# Patient Record
Sex: Female | Born: 1935 | Race: White | Hispanic: No | Marital: Single | Smoking: Never smoker
Health system: Southern US, Community
[De-identification: ages and names within clinical notes are randomized; demographics above are authoritative.]

## PROBLEM LIST (undated history)

## (undated) DIAGNOSIS — R32 Unspecified urinary incontinence: Secondary | ICD-10-CM

## (undated) DIAGNOSIS — M8580 Other specified disorders of bone density and structure, unspecified site: Secondary | ICD-10-CM

## (undated) DIAGNOSIS — C8190 Hodgkin lymphoma, unspecified, unspecified site: Secondary | ICD-10-CM

## (undated) DIAGNOSIS — F419 Anxiety disorder, unspecified: Secondary | ICD-10-CM

## (undated) DIAGNOSIS — I639 Cerebral infarction, unspecified: Secondary | ICD-10-CM

## (undated) DIAGNOSIS — Z8639 Personal history of other endocrine, nutritional and metabolic disease: Secondary | ICD-10-CM

## (undated) DIAGNOSIS — I10 Essential (primary) hypertension: Secondary | ICD-10-CM

## (undated) DIAGNOSIS — R2689 Other abnormalities of gait and mobility: Secondary | ICD-10-CM

## (undated) DIAGNOSIS — F32A Depression, unspecified: Secondary | ICD-10-CM

## (undated) DIAGNOSIS — F329 Major depressive disorder, single episode, unspecified: Secondary | ICD-10-CM

## (undated) DIAGNOSIS — E559 Vitamin D deficiency, unspecified: Secondary | ICD-10-CM

## (undated) DIAGNOSIS — E1140 Type 2 diabetes mellitus with diabetic neuropathy, unspecified: Secondary | ICD-10-CM

## (undated) DIAGNOSIS — D649 Anemia, unspecified: Secondary | ICD-10-CM

## (undated) DIAGNOSIS — B449 Aspergillosis, unspecified: Secondary | ICD-10-CM

## (undated) DIAGNOSIS — D126 Benign neoplasm of colon, unspecified: Secondary | ICD-10-CM

## (undated) DIAGNOSIS — E119 Type 2 diabetes mellitus without complications: Secondary | ICD-10-CM

## (undated) DIAGNOSIS — F840 Autistic disorder: Secondary | ICD-10-CM

## (undated) DIAGNOSIS — E785 Hyperlipidemia, unspecified: Secondary | ICD-10-CM

## (undated) DIAGNOSIS — R200 Anesthesia of skin: Secondary | ICD-10-CM

## (undated) DIAGNOSIS — I509 Heart failure, unspecified: Secondary | ICD-10-CM

## (undated) DIAGNOSIS — N189 Chronic kidney disease, unspecified: Secondary | ICD-10-CM

## (undated) HISTORY — DX: Other specified disorders of bone density and structure, unspecified site: M85.80

## (undated) HISTORY — DX: Cerebral infarction, unspecified: I63.9

## (undated) HISTORY — PX: ABDOMINAL HYSTERECTOMY: SHX81

## (undated) HISTORY — DX: Depression, unspecified: F32.A

## (undated) HISTORY — DX: Anesthesia of skin: R20.0

## (undated) HISTORY — DX: Major depressive disorder, single episode, unspecified: F32.9

## (undated) HISTORY — DX: Vitamin D deficiency, unspecified: E55.9

## (undated) HISTORY — DX: Heart failure, unspecified: I50.9

## (undated) HISTORY — DX: Essential (primary) hypertension: I10

## (undated) HISTORY — DX: Anxiety disorder, unspecified: F41.9

## (undated) HISTORY — DX: Autistic disorder: F84.0

## (undated) HISTORY — DX: Type 2 diabetes mellitus with diabetic neuropathy, unspecified: E11.40

## (undated) HISTORY — PX: BREAST LUMPECTOMY: SHX2

## (undated) HISTORY — DX: Other abnormalities of gait and mobility: R26.89

## (undated) HISTORY — DX: Chronic kidney disease, unspecified: N18.9

## (undated) HISTORY — DX: Personal history of other endocrine, nutritional and metabolic disease: Z86.39

## (undated) HISTORY — DX: Anemia, unspecified: D64.9

## (undated) HISTORY — DX: Benign neoplasm of colon, unspecified: D12.6

---

## 2009-02-25 LAB — HM COLONOSCOPY

## 2011-04-27 DIAGNOSIS — Z1231 Encounter for screening mammogram for malignant neoplasm of breast: Secondary | ICD-10-CM | POA: Diagnosis not present

## 2011-04-28 DIAGNOSIS — D649 Anemia, unspecified: Secondary | ICD-10-CM | POA: Diagnosis not present

## 2011-04-28 DIAGNOSIS — E1165 Type 2 diabetes mellitus with hyperglycemia: Secondary | ICD-10-CM | POA: Diagnosis not present

## 2011-04-28 DIAGNOSIS — E118 Type 2 diabetes mellitus with unspecified complications: Secondary | ICD-10-CM | POA: Diagnosis not present

## 2011-04-28 DIAGNOSIS — L259 Unspecified contact dermatitis, unspecified cause: Secondary | ICD-10-CM | POA: Diagnosis not present

## 2011-05-02 DIAGNOSIS — R928 Other abnormal and inconclusive findings on diagnostic imaging of breast: Secondary | ICD-10-CM | POA: Diagnosis not present

## 2011-05-06 DIAGNOSIS — M79609 Pain in unspecified limb: Secondary | ICD-10-CM | POA: Diagnosis not present

## 2011-05-06 DIAGNOSIS — R269 Unspecified abnormalities of gait and mobility: Secondary | ICD-10-CM | POA: Diagnosis not present

## 2011-05-06 DIAGNOSIS — L608 Other nail disorders: Secondary | ICD-10-CM | POA: Diagnosis not present

## 2011-05-06 DIAGNOSIS — B351 Tinea unguium: Secondary | ICD-10-CM | POA: Diagnosis not present

## 2011-05-12 DIAGNOSIS — K2100 Gastro-esophageal reflux disease with esophagitis, without bleeding: Secondary | ICD-10-CM | POA: Diagnosis not present

## 2011-05-13 DIAGNOSIS — E119 Type 2 diabetes mellitus without complications: Secondary | ICD-10-CM | POA: Diagnosis not present

## 2011-06-08 DIAGNOSIS — L259 Unspecified contact dermatitis, unspecified cause: Secondary | ICD-10-CM | POA: Diagnosis not present

## 2011-06-29 DIAGNOSIS — L259 Unspecified contact dermatitis, unspecified cause: Secondary | ICD-10-CM | POA: Diagnosis not present

## 2011-06-29 DIAGNOSIS — S61209A Unspecified open wound of unspecified finger without damage to nail, initial encounter: Secondary | ICD-10-CM | POA: Diagnosis not present

## 2011-06-29 DIAGNOSIS — N281 Cyst of kidney, acquired: Secondary | ICD-10-CM | POA: Diagnosis not present

## 2011-06-29 DIAGNOSIS — E118 Type 2 diabetes mellitus with unspecified complications: Secondary | ICD-10-CM | POA: Diagnosis not present

## 2011-09-07 DIAGNOSIS — I119 Hypertensive heart disease without heart failure: Secondary | ICD-10-CM | POA: Diagnosis not present

## 2011-09-07 DIAGNOSIS — S79919A Unspecified injury of unspecified hip, initial encounter: Secondary | ICD-10-CM | POA: Diagnosis not present

## 2011-09-07 DIAGNOSIS — D7589 Other specified diseases of blood and blood-forming organs: Secondary | ICD-10-CM | POA: Diagnosis not present

## 2011-09-07 DIAGNOSIS — E785 Hyperlipidemia, unspecified: Secondary | ICD-10-CM | POA: Diagnosis not present

## 2011-09-07 DIAGNOSIS — M25559 Pain in unspecified hip: Secondary | ICD-10-CM | POA: Diagnosis not present

## 2011-09-07 DIAGNOSIS — I519 Heart disease, unspecified: Secondary | ICD-10-CM | POA: Diagnosis not present

## 2011-09-07 DIAGNOSIS — M25569 Pain in unspecified knee: Secondary | ICD-10-CM | POA: Diagnosis not present

## 2011-09-07 DIAGNOSIS — S99919A Unspecified injury of unspecified ankle, initial encounter: Secondary | ICD-10-CM | POA: Diagnosis not present

## 2011-09-07 DIAGNOSIS — S8990XA Unspecified injury of unspecified lower leg, initial encounter: Secondary | ICD-10-CM | POA: Diagnosis not present

## 2011-09-07 DIAGNOSIS — R350 Frequency of micturition: Secondary | ICD-10-CM | POA: Diagnosis not present

## 2011-09-07 DIAGNOSIS — S99929A Unspecified injury of unspecified foot, initial encounter: Secondary | ICD-10-CM | POA: Diagnosis not present

## 2011-09-07 DIAGNOSIS — E118 Type 2 diabetes mellitus with unspecified complications: Secondary | ICD-10-CM | POA: Diagnosis not present

## 2011-09-07 DIAGNOSIS — E559 Vitamin D deficiency, unspecified: Secondary | ICD-10-CM | POA: Diagnosis not present

## 2011-10-20 DIAGNOSIS — M25579 Pain in unspecified ankle and joints of unspecified foot: Secondary | ICD-10-CM | POA: Diagnosis not present

## 2011-10-20 DIAGNOSIS — B351 Tinea unguium: Secondary | ICD-10-CM | POA: Diagnosis not present

## 2011-10-20 DIAGNOSIS — E119 Type 2 diabetes mellitus without complications: Secondary | ICD-10-CM | POA: Diagnosis not present

## 2011-10-20 DIAGNOSIS — L609 Nail disorder, unspecified: Secondary | ICD-10-CM | POA: Diagnosis not present

## 2011-11-19 DIAGNOSIS — Z79899 Other long term (current) drug therapy: Secondary | ICD-10-CM | POA: Diagnosis not present

## 2011-11-19 DIAGNOSIS — S99929A Unspecified injury of unspecified foot, initial encounter: Secondary | ICD-10-CM | POA: Diagnosis not present

## 2011-11-19 DIAGNOSIS — S92919A Unspecified fracture of unspecified toe(s), initial encounter for closed fracture: Secondary | ICD-10-CM | POA: Diagnosis not present

## 2011-11-19 DIAGNOSIS — E119 Type 2 diabetes mellitus without complications: Secondary | ICD-10-CM | POA: Diagnosis not present

## 2011-11-19 DIAGNOSIS — M79609 Pain in unspecified limb: Secondary | ICD-10-CM | POA: Diagnosis not present

## 2011-11-19 DIAGNOSIS — S8990XA Unspecified injury of unspecified lower leg, initial encounter: Secondary | ICD-10-CM | POA: Diagnosis not present

## 2011-11-23 DIAGNOSIS — S90129A Contusion of unspecified lesser toe(s) without damage to nail, initial encounter: Secondary | ICD-10-CM | POA: Diagnosis not present

## 2011-12-15 DIAGNOSIS — R5381 Other malaise: Secondary | ICD-10-CM | POA: Diagnosis not present

## 2011-12-15 DIAGNOSIS — I10 Essential (primary) hypertension: Secondary | ICD-10-CM | POA: Diagnosis not present

## 2011-12-15 DIAGNOSIS — E118 Type 2 diabetes mellitus with unspecified complications: Secondary | ICD-10-CM | POA: Diagnosis not present

## 2011-12-15 DIAGNOSIS — E871 Hypo-osmolality and hyponatremia: Secondary | ICD-10-CM | POA: Diagnosis not present

## 2011-12-15 DIAGNOSIS — E785 Hyperlipidemia, unspecified: Secondary | ICD-10-CM | POA: Diagnosis not present

## 2011-12-15 DIAGNOSIS — E559 Vitamin D deficiency, unspecified: Secondary | ICD-10-CM | POA: Diagnosis not present

## 2011-12-15 DIAGNOSIS — R5383 Other fatigue: Secondary | ICD-10-CM | POA: Diagnosis not present

## 2011-12-15 DIAGNOSIS — I1310 Hypertensive heart and chronic kidney disease without heart failure, with stage 1 through stage 4 chronic kidney disease, or unspecified chronic kidney disease: Secondary | ICD-10-CM | POA: Diagnosis not present

## 2011-12-15 DIAGNOSIS — I119 Hypertensive heart disease without heart failure: Secondary | ICD-10-CM | POA: Diagnosis not present

## 2011-12-19 DIAGNOSIS — E119 Type 2 diabetes mellitus without complications: Secondary | ICD-10-CM | POA: Diagnosis not present

## 2011-12-22 DIAGNOSIS — B351 Tinea unguium: Secondary | ICD-10-CM | POA: Diagnosis not present

## 2011-12-22 DIAGNOSIS — L609 Nail disorder, unspecified: Secondary | ICD-10-CM | POA: Diagnosis not present

## 2011-12-22 DIAGNOSIS — M25579 Pain in unspecified ankle and joints of unspecified foot: Secondary | ICD-10-CM | POA: Diagnosis not present

## 2011-12-22 DIAGNOSIS — E1159 Type 2 diabetes mellitus with other circulatory complications: Secondary | ICD-10-CM | POA: Diagnosis not present

## 2012-01-16 DIAGNOSIS — H2589 Other age-related cataract: Secondary | ICD-10-CM | POA: Diagnosis not present

## 2012-01-19 DIAGNOSIS — H2510 Age-related nuclear cataract, unspecified eye: Secondary | ICD-10-CM | POA: Diagnosis not present

## 2012-03-14 DIAGNOSIS — M109 Gout, unspecified: Secondary | ICD-10-CM | POA: Diagnosis not present

## 2012-03-14 DIAGNOSIS — R5381 Other malaise: Secondary | ICD-10-CM | POA: Diagnosis not present

## 2012-03-14 DIAGNOSIS — D649 Anemia, unspecified: Secondary | ICD-10-CM | POA: Diagnosis not present

## 2012-03-14 DIAGNOSIS — I1310 Hypertensive heart and chronic kidney disease without heart failure, with stage 1 through stage 4 chronic kidney disease, or unspecified chronic kidney disease: Secondary | ICD-10-CM | POA: Diagnosis not present

## 2012-03-14 DIAGNOSIS — E118 Type 2 diabetes mellitus with unspecified complications: Secondary | ICD-10-CM | POA: Diagnosis not present

## 2012-03-14 DIAGNOSIS — F329 Major depressive disorder, single episode, unspecified: Secondary | ICD-10-CM | POA: Diagnosis not present

## 2012-03-14 DIAGNOSIS — I119 Hypertensive heart disease without heart failure: Secondary | ICD-10-CM | POA: Diagnosis not present

## 2012-03-14 DIAGNOSIS — E785 Hyperlipidemia, unspecified: Secondary | ICD-10-CM | POA: Diagnosis not present

## 2012-03-14 DIAGNOSIS — R5383 Other fatigue: Secondary | ICD-10-CM | POA: Diagnosis not present

## 2012-03-14 DIAGNOSIS — E1165 Type 2 diabetes mellitus with hyperglycemia: Secondary | ICD-10-CM | POA: Diagnosis not present

## 2012-03-27 ENCOUNTER — Emergency Department (HOSPITAL_COMMUNITY)
Admission: EM | Admit: 2012-03-27 | Discharge: 2012-03-27 | Disposition: A | Discharge status: Home / Self Care | Payer: Medicare Other | Attending: Emergency Medicine | Admitting: Emergency Medicine

## 2012-03-27 ENCOUNTER — Encounter (HOSPITAL_COMMUNITY): Payer: Self-pay | Admitting: *Deleted

## 2012-03-27 DIAGNOSIS — R229 Localized swelling, mass and lump, unspecified: Secondary | ICD-10-CM

## 2012-03-27 DIAGNOSIS — C8190 Hodgkin lymphoma, unspecified, unspecified site: Secondary | ICD-10-CM | POA: Insufficient documentation

## 2012-03-27 DIAGNOSIS — Z90710 Acquired absence of both cervix and uterus: Secondary | ICD-10-CM | POA: Diagnosis not present

## 2012-03-27 DIAGNOSIS — E119 Type 2 diabetes mellitus without complications: Secondary | ICD-10-CM | POA: Diagnosis not present

## 2012-03-27 DIAGNOSIS — Z8619 Personal history of other infectious and parasitic diseases: Secondary | ICD-10-CM | POA: Diagnosis not present

## 2012-03-27 DIAGNOSIS — E785 Hyperlipidemia, unspecified: Secondary | ICD-10-CM | POA: Insufficient documentation

## 2012-03-27 HISTORY — DX: Aspergillosis, unspecified: B44.9

## 2012-03-27 HISTORY — DX: Unspecified urinary incontinence: R32

## 2012-03-27 HISTORY — DX: Hodgkin lymphoma, unspecified, unspecified site: C81.90

## 2012-03-27 HISTORY — DX: Type 2 diabetes mellitus without complications: E11.9

## 2012-03-27 HISTORY — DX: Hyperlipidemia, unspecified: E78.5

## 2012-03-27 NOTE — ED Notes (Signed)
Patient is alert and orientedx4.  Patient is accompanied by Grafton Folk, her medical power of attorney who is here with the patient.  She was explained the discharge instructions and she understood them with no questions.

## 2012-03-27 NOTE — ED Provider Notes (Signed)
History   This chart was scribed for Lisa Roots, MD by Charolett Bumpers, ED Scribe. The patient was seen in room TR07C/TR07C. Patient's care was started at 1749.   CSN: [id number]  Arrival date & time 03/27/12  1730   First MD Initiated Contact with Patient 03/27/12 1749      Chief Complaint  Patient presents with  . RT arm growth     The history is provided by the patient. No language interpreter was used.  Lisa Villegas is a 78 y.o. female who presents to the Emergency Department complaining of small growth to her right upper inner arm that is 3 cm in diameter that family noticed yesterday. She denies any associated pain with the growth. She has a h/o hodgkin's disease. She also complains of an itchy area to right forearm that is not improved and has a few small scabs on. No other skin rash, itching, or other skin lesions noted. Does not feel ill or sick. No fever or chills. No nv.      Past Medical History  Diagnosis Date  . Diabetes mellitus without complication   . Hodgkin disease   . Hyperlipidemia   . Aspergillosis   . Incontinence     Past Surgical History  Procedure Date  . Abdominal hysterectomy   . Breast surgery     No family history on file.  History  Substance Use Topics  . Smoking status: Never Smoker   . Smokeless tobacco: Not on file  . Alcohol Use: No    OB History    Grav Para Term Preterm Abortions TAB SAB Ect Mult Living                  Review of Systems  Skin: Positive for rash.       Growth to right upper arm.   All other systems reviewed and are negative.    Allergies  Penicillins  Home Medications  No current outpatient prescriptions on file.  BP 119/58  Pulse 80  Temp 98 F (36.7 C) (Oral)  Resp 16  SpO2 97%  Physical Exam  Nursing note and vitals reviewed. Constitutional: She is oriented to person, place, and time. She appears well-developed and well-nourished. No distress.  HENT:  Head: Normocephalic and  atraumatic.  Eyes: Conjunctivae normal are normal. No scleral icterus.  Neck: Neck supple. No tracheal deviation present.  Cardiovascular: Normal rate.   Pulmonary/Chest: Effort normal. No respiratory distress.  Abdominal: Soft. There is no tenderness.  Musculoskeletal: Normal range of motion.       Soft, fleshy, mobile, non tender 3-4 cm diameter subcutaneous mass medial aspect right upper arm. No erythema or skin changes. No other masses noted.   Neurological: She is alert and oriented to person, place, and time. No cranial nerve deficit.  Skin: Skin is warm and dry. Rash noted.  Psychiatric: She has a normal mood and affect. Her behavior is normal.    ED Course  Procedures (including critical care time)  DIAGNOSTIC STUDIES: Oxygen Saturation is 97% on room air, adequate by my interpretation.    COORDINATION OF CARE:  18:04-Discussed planned course of treatment with the patient, who is agreeable at this time. ]     MDM  I personally performed the services described in this documentation, which was scribed in my presence. The recorded information has been reviewed and is accurate.  Few tiny, healing scabs to left forearm. Not itchy currently. Felt most consistently with scratching/scratch mark to  area. No rash, no scabies.  Soft fleshy non tender mobile mass inner aspect right upper arm most c/w lipoma - discussed w family need for pcp f/u, if area becomes painful, larger, further evaluation - discussed need pcp follow up.       Lisa Roots, MD 03/27/12 [phone]

## 2012-03-27 NOTE — ED Notes (Signed)
Patient came in with a "gorwth" in her right arm. Denies, pain, not draining, or redness.

## 2012-03-27 NOTE — ED Notes (Signed)
Pt is here with soft growth noted to right upper arm she noted yesterday.  Pt has rash to left forearm that itches

## 2012-05-21 DIAGNOSIS — F411 Generalized anxiety disorder: Secondary | ICD-10-CM | POA: Diagnosis not present

## 2012-05-21 DIAGNOSIS — K2100 Gastro-esophageal reflux disease with esophagitis, without bleeding: Secondary | ICD-10-CM | POA: Diagnosis not present

## 2012-05-21 DIAGNOSIS — D1779 Benign lipomatous neoplasm of other sites: Secondary | ICD-10-CM | POA: Diagnosis not present

## 2012-05-21 DIAGNOSIS — Q6689 Other  specified congenital deformities of feet: Secondary | ICD-10-CM | POA: Diagnosis not present

## 2012-05-21 DIAGNOSIS — Z79899 Other long term (current) drug therapy: Secondary | ICD-10-CM | POA: Diagnosis not present

## 2012-06-05 DIAGNOSIS — M21969 Unspecified acquired deformity of unspecified lower leg: Secondary | ICD-10-CM | POA: Diagnosis not present

## 2012-06-05 DIAGNOSIS — E860 Dehydration: Secondary | ICD-10-CM | POA: Diagnosis not present

## 2012-08-02 ENCOUNTER — Other Ambulatory Visit: Payer: Self-pay | Admitting: Internal Medicine

## 2012-08-02 DIAGNOSIS — E041 Nontoxic single thyroid nodule: Secondary | ICD-10-CM | POA: Diagnosis not present

## 2012-08-02 DIAGNOSIS — E785 Hyperlipidemia, unspecified: Secondary | ICD-10-CM | POA: Diagnosis not present

## 2012-08-02 DIAGNOSIS — E119 Type 2 diabetes mellitus without complications: Secondary | ICD-10-CM | POA: Diagnosis not present

## 2012-08-02 DIAGNOSIS — I10 Essential (primary) hypertension: Secondary | ICD-10-CM | POA: Diagnosis not present

## 2012-08-07 ENCOUNTER — Ambulatory Visit
Admission: RE | Admit: 2012-08-07 | Discharge: 2012-08-07 | Disposition: A | Discharge status: Home / Self Care | Payer: Medicare Other | Source: Ambulatory Visit | Attending: Internal Medicine | Admitting: Internal Medicine

## 2012-08-07 DIAGNOSIS — E042 Nontoxic multinodular goiter: Secondary | ICD-10-CM | POA: Diagnosis not present

## 2012-08-07 DIAGNOSIS — E041 Nontoxic single thyroid nodule: Secondary | ICD-10-CM

## 2012-08-16 ENCOUNTER — Other Ambulatory Visit: Payer: Self-pay | Admitting: Internal Medicine

## 2012-08-16 DIAGNOSIS — E041 Nontoxic single thyroid nodule: Secondary | ICD-10-CM

## 2012-08-21 ENCOUNTER — Ambulatory Visit
Admission: RE | Admit: 2012-08-21 | Discharge: 2012-08-21 | Disposition: A | Discharge status: Home / Self Care | Payer: Medicare Other | Source: Ambulatory Visit | Attending: Internal Medicine | Admitting: Internal Medicine

## 2012-08-21 ENCOUNTER — Other Ambulatory Visit (HOSPITAL_COMMUNITY)
Admission: RE | Admit: 2012-08-21 | Discharge: 2012-08-21 | Disposition: A | Discharge status: Home / Self Care | Payer: Medicare Other | Source: Ambulatory Visit | Attending: Diagnostic Radiology | Admitting: Diagnostic Radiology

## 2012-08-21 DIAGNOSIS — E049 Nontoxic goiter, unspecified: Secondary | ICD-10-CM | POA: Diagnosis not present

## 2012-08-21 DIAGNOSIS — E041 Nontoxic single thyroid nodule: Secondary | ICD-10-CM

## 2012-08-23 DIAGNOSIS — H25049 Posterior subcapsular polar age-related cataract, unspecified eye: Secondary | ICD-10-CM | POA: Diagnosis not present

## 2012-08-23 DIAGNOSIS — H2510 Age-related nuclear cataract, unspecified eye: Secondary | ICD-10-CM | POA: Diagnosis not present

## 2012-10-01 DIAGNOSIS — F329 Major depressive disorder, single episode, unspecified: Secondary | ICD-10-CM | POA: Diagnosis not present

## 2012-10-01 DIAGNOSIS — E119 Type 2 diabetes mellitus without complications: Secondary | ICD-10-CM | POA: Insufficient documentation

## 2012-10-01 DIAGNOSIS — F32A Depression, unspecified: Secondary | ICD-10-CM | POA: Insufficient documentation

## 2012-10-01 DIAGNOSIS — I10 Essential (primary) hypertension: Secondary | ICD-10-CM | POA: Insufficient documentation

## 2012-10-01 DIAGNOSIS — E785 Hyperlipidemia, unspecified: Secondary | ICD-10-CM | POA: Insufficient documentation

## 2012-10-01 DIAGNOSIS — G8929 Other chronic pain: Secondary | ICD-10-CM | POA: Insufficient documentation

## 2012-10-01 DIAGNOSIS — F0390 Unspecified dementia without behavioral disturbance: Secondary | ICD-10-CM | POA: Insufficient documentation

## 2012-10-01 DIAGNOSIS — G629 Polyneuropathy, unspecified: Secondary | ICD-10-CM | POA: Insufficient documentation

## 2012-10-01 DIAGNOSIS — F419 Anxiety disorder, unspecified: Secondary | ICD-10-CM | POA: Insufficient documentation

## 2012-10-01 DIAGNOSIS — H2510 Age-related nuclear cataract, unspecified eye: Secondary | ICD-10-CM | POA: Insufficient documentation

## 2012-10-17 DIAGNOSIS — E785 Hyperlipidemia, unspecified: Secondary | ICD-10-CM | POA: Diagnosis not present

## 2012-10-17 DIAGNOSIS — H25019 Cortical age-related cataract, unspecified eye: Secondary | ICD-10-CM | POA: Diagnosis not present

## 2012-10-17 DIAGNOSIS — K219 Gastro-esophageal reflux disease without esophagitis: Secondary | ICD-10-CM | POA: Diagnosis not present

## 2012-10-17 DIAGNOSIS — E7800 Pure hypercholesterolemia, unspecified: Secondary | ICD-10-CM | POA: Diagnosis not present

## 2012-10-17 DIAGNOSIS — H2510 Age-related nuclear cataract, unspecified eye: Secondary | ICD-10-CM | POA: Diagnosis not present

## 2012-10-17 DIAGNOSIS — G8929 Other chronic pain: Secondary | ICD-10-CM | POA: Diagnosis not present

## 2012-10-17 DIAGNOSIS — G609 Hereditary and idiopathic neuropathy, unspecified: Secondary | ICD-10-CM | POA: Diagnosis not present

## 2012-10-17 DIAGNOSIS — F411 Generalized anxiety disorder: Secondary | ICD-10-CM | POA: Diagnosis not present

## 2012-10-17 DIAGNOSIS — E119 Type 2 diabetes mellitus without complications: Secondary | ICD-10-CM | POA: Diagnosis not present

## 2012-10-17 DIAGNOSIS — E669 Obesity, unspecified: Secondary | ICD-10-CM | POA: Diagnosis not present

## 2012-10-17 DIAGNOSIS — G4700 Insomnia, unspecified: Secondary | ICD-10-CM | POA: Diagnosis not present

## 2012-10-17 DIAGNOSIS — I10 Essential (primary) hypertension: Secondary | ICD-10-CM | POA: Diagnosis not present

## 2012-10-17 DIAGNOSIS — F329 Major depressive disorder, single episode, unspecified: Secondary | ICD-10-CM | POA: Diagnosis not present

## 2012-10-17 DIAGNOSIS — H25049 Posterior subcapsular polar age-related cataract, unspecified eye: Secondary | ICD-10-CM | POA: Diagnosis not present

## 2012-10-17 DIAGNOSIS — M25559 Pain in unspecified hip: Secondary | ICD-10-CM | POA: Diagnosis not present

## 2012-10-24 DIAGNOSIS — H25049 Posterior subcapsular polar age-related cataract, unspecified eye: Secondary | ICD-10-CM | POA: Diagnosis not present

## 2012-10-24 DIAGNOSIS — G609 Hereditary and idiopathic neuropathy, unspecified: Secondary | ICD-10-CM | POA: Diagnosis not present

## 2012-10-24 DIAGNOSIS — K219 Gastro-esophageal reflux disease without esophagitis: Secondary | ICD-10-CM | POA: Diagnosis not present

## 2012-10-24 DIAGNOSIS — Z794 Long term (current) use of insulin: Secondary | ICD-10-CM | POA: Diagnosis not present

## 2012-10-24 DIAGNOSIS — Z79899 Other long term (current) drug therapy: Secondary | ICD-10-CM | POA: Diagnosis not present

## 2012-10-24 DIAGNOSIS — F0390 Unspecified dementia without behavioral disturbance: Secondary | ICD-10-CM | POA: Diagnosis not present

## 2012-10-24 DIAGNOSIS — F411 Generalized anxiety disorder: Secondary | ICD-10-CM | POA: Diagnosis not present

## 2012-10-24 DIAGNOSIS — E785 Hyperlipidemia, unspecified: Secondary | ICD-10-CM | POA: Diagnosis not present

## 2012-10-24 DIAGNOSIS — H2510 Age-related nuclear cataract, unspecified eye: Secondary | ICD-10-CM | POA: Diagnosis not present

## 2012-10-24 DIAGNOSIS — M25559 Pain in unspecified hip: Secondary | ICD-10-CM | POA: Diagnosis not present

## 2012-10-24 DIAGNOSIS — F329 Major depressive disorder, single episode, unspecified: Secondary | ICD-10-CM | POA: Diagnosis not present

## 2012-10-24 DIAGNOSIS — G4700 Insomnia, unspecified: Secondary | ICD-10-CM | POA: Diagnosis not present

## 2012-10-24 DIAGNOSIS — E119 Type 2 diabetes mellitus without complications: Secondary | ICD-10-CM | POA: Diagnosis not present

## 2012-10-24 DIAGNOSIS — I10 Essential (primary) hypertension: Secondary | ICD-10-CM | POA: Diagnosis not present

## 2012-10-24 DIAGNOSIS — H25019 Cortical age-related cataract, unspecified eye: Secondary | ICD-10-CM | POA: Diagnosis not present

## 2012-10-25 DIAGNOSIS — Z961 Presence of intraocular lens: Secondary | ICD-10-CM | POA: Insufficient documentation

## 2012-10-25 DIAGNOSIS — Z9849 Cataract extraction status, unspecified eye: Secondary | ICD-10-CM | POA: Insufficient documentation

## 2012-11-06 ENCOUNTER — Other Ambulatory Visit: Payer: Self-pay

## 2012-11-06 DIAGNOSIS — M810 Age-related osteoporosis without current pathological fracture: Secondary | ICD-10-CM | POA: Diagnosis not present

## 2012-11-06 DIAGNOSIS — Z1231 Encounter for screening mammogram for malignant neoplasm of breast: Secondary | ICD-10-CM

## 2012-11-06 DIAGNOSIS — I10 Essential (primary) hypertension: Secondary | ICD-10-CM | POA: Diagnosis not present

## 2012-11-06 DIAGNOSIS — E119 Type 2 diabetes mellitus without complications: Secondary | ICD-10-CM | POA: Diagnosis not present

## 2012-11-06 DIAGNOSIS — Z0000 Encounter for general adult medical examination without abnormal findings: Secondary | ICD-10-CM | POA: Diagnosis not present

## 2012-11-13 DIAGNOSIS — E1129 Type 2 diabetes mellitus with other diabetic kidney complication: Secondary | ICD-10-CM | POA: Diagnosis not present

## 2012-11-13 DIAGNOSIS — E785 Hyperlipidemia, unspecified: Secondary | ICD-10-CM | POA: Diagnosis not present

## 2012-11-13 DIAGNOSIS — N183 Chronic kidney disease, stage 3 unspecified: Secondary | ICD-10-CM | POA: Diagnosis not present

## 2012-11-13 DIAGNOSIS — I10 Essential (primary) hypertension: Secondary | ICD-10-CM | POA: Diagnosis not present

## 2012-11-20 ENCOUNTER — Ambulatory Visit
Admission: RE | Admit: 2012-11-20 | Discharge: 2012-11-20 | Disposition: A | Discharge status: Home / Self Care | Payer: Medicare Other | Source: Ambulatory Visit

## 2012-11-20 DIAGNOSIS — Z1231 Encounter for screening mammogram for malignant neoplasm of breast: Secondary | ICD-10-CM | POA: Diagnosis not present

## 2013-01-23 DIAGNOSIS — R262 Difficulty in walking, not elsewhere classified: Secondary | ICD-10-CM | POA: Diagnosis not present

## 2013-01-23 DIAGNOSIS — IMO0001 Reserved for inherently not codable concepts without codable children: Secondary | ICD-10-CM | POA: Diagnosis not present

## 2013-01-24 DIAGNOSIS — IMO0001 Reserved for inherently not codable concepts without codable children: Secondary | ICD-10-CM | POA: Diagnosis not present

## 2013-01-24 DIAGNOSIS — R262 Difficulty in walking, not elsewhere classified: Secondary | ICD-10-CM | POA: Diagnosis not present

## 2013-01-28 DIAGNOSIS — IMO0001 Reserved for inherently not codable concepts without codable children: Secondary | ICD-10-CM | POA: Diagnosis not present

## 2013-01-28 DIAGNOSIS — R262 Difficulty in walking, not elsewhere classified: Secondary | ICD-10-CM | POA: Diagnosis not present

## 2013-01-30 DIAGNOSIS — IMO0001 Reserved for inherently not codable concepts without codable children: Secondary | ICD-10-CM | POA: Diagnosis not present

## 2013-01-30 DIAGNOSIS — R262 Difficulty in walking, not elsewhere classified: Secondary | ICD-10-CM | POA: Diagnosis not present

## 2013-02-01 DIAGNOSIS — IMO0001 Reserved for inherently not codable concepts without codable children: Secondary | ICD-10-CM | POA: Diagnosis not present

## 2013-02-01 DIAGNOSIS — R262 Difficulty in walking, not elsewhere classified: Secondary | ICD-10-CM | POA: Diagnosis not present

## 2013-02-04 DIAGNOSIS — R262 Difficulty in walking, not elsewhere classified: Secondary | ICD-10-CM | POA: Diagnosis not present

## 2013-02-04 DIAGNOSIS — IMO0001 Reserved for inherently not codable concepts without codable children: Secondary | ICD-10-CM | POA: Diagnosis not present

## 2013-02-06 DIAGNOSIS — R262 Difficulty in walking, not elsewhere classified: Secondary | ICD-10-CM | POA: Diagnosis not present

## 2013-02-06 DIAGNOSIS — IMO0001 Reserved for inherently not codable concepts without codable children: Secondary | ICD-10-CM | POA: Diagnosis not present

## 2013-02-07 DIAGNOSIS — R262 Difficulty in walking, not elsewhere classified: Secondary | ICD-10-CM | POA: Diagnosis not present

## 2013-02-07 DIAGNOSIS — IMO0001 Reserved for inherently not codable concepts without codable children: Secondary | ICD-10-CM | POA: Diagnosis not present

## 2013-02-11 DIAGNOSIS — Z23 Encounter for immunization: Secondary | ICD-10-CM | POA: Diagnosis not present

## 2013-02-11 DIAGNOSIS — F329 Major depressive disorder, single episode, unspecified: Secondary | ICD-10-CM | POA: Diagnosis not present

## 2013-02-11 DIAGNOSIS — D509 Iron deficiency anemia, unspecified: Secondary | ICD-10-CM | POA: Diagnosis not present

## 2013-02-12 DIAGNOSIS — IMO0001 Reserved for inherently not codable concepts without codable children: Secondary | ICD-10-CM | POA: Diagnosis not present

## 2013-02-12 DIAGNOSIS — R262 Difficulty in walking, not elsewhere classified: Secondary | ICD-10-CM | POA: Diagnosis not present

## 2013-02-13 DIAGNOSIS — IMO0001 Reserved for inherently not codable concepts without codable children: Secondary | ICD-10-CM | POA: Diagnosis not present

## 2013-02-13 DIAGNOSIS — R262 Difficulty in walking, not elsewhere classified: Secondary | ICD-10-CM | POA: Diagnosis not present

## 2013-02-15 DIAGNOSIS — IMO0001 Reserved for inherently not codable concepts without codable children: Secondary | ICD-10-CM | POA: Diagnosis not present

## 2013-02-15 DIAGNOSIS — R262 Difficulty in walking, not elsewhere classified: Secondary | ICD-10-CM | POA: Diagnosis not present

## 2013-02-18 DIAGNOSIS — R262 Difficulty in walking, not elsewhere classified: Secondary | ICD-10-CM | POA: Diagnosis not present

## 2013-02-18 DIAGNOSIS — IMO0001 Reserved for inherently not codable concepts without codable children: Secondary | ICD-10-CM | POA: Diagnosis not present

## 2013-02-20 DIAGNOSIS — IMO0001 Reserved for inherently not codable concepts without codable children: Secondary | ICD-10-CM | POA: Diagnosis not present

## 2013-02-20 DIAGNOSIS — Z79899 Other long term (current) drug therapy: Secondary | ICD-10-CM | POA: Diagnosis not present

## 2013-02-20 DIAGNOSIS — D649 Anemia, unspecified: Secondary | ICD-10-CM | POA: Diagnosis not present

## 2013-02-20 DIAGNOSIS — R262 Difficulty in walking, not elsewhere classified: Secondary | ICD-10-CM | POA: Diagnosis not present

## 2013-02-20 DIAGNOSIS — Z130 Encounter for screening for diseases of the blood and blood-forming organs and certain disorders involving the immune mechanism: Secondary | ICD-10-CM | POA: Diagnosis not present

## 2013-02-25 DIAGNOSIS — R262 Difficulty in walking, not elsewhere classified: Secondary | ICD-10-CM | POA: Diagnosis not present

## 2013-02-25 DIAGNOSIS — IMO0001 Reserved for inherently not codable concepts without codable children: Secondary | ICD-10-CM | POA: Diagnosis not present

## 2013-02-25 DIAGNOSIS — L219 Seborrheic dermatitis, unspecified: Secondary | ICD-10-CM | POA: Diagnosis not present

## 2013-02-25 DIAGNOSIS — L259 Unspecified contact dermatitis, unspecified cause: Secondary | ICD-10-CM | POA: Diagnosis not present

## 2013-02-25 DIAGNOSIS — B372 Candidiasis of skin and nail: Secondary | ICD-10-CM | POA: Diagnosis not present

## 2013-02-28 DIAGNOSIS — R262 Difficulty in walking, not elsewhere classified: Secondary | ICD-10-CM | POA: Diagnosis not present

## 2013-02-28 DIAGNOSIS — IMO0001 Reserved for inherently not codable concepts without codable children: Secondary | ICD-10-CM | POA: Diagnosis not present

## 2013-03-01 DIAGNOSIS — R262 Difficulty in walking, not elsewhere classified: Secondary | ICD-10-CM | POA: Diagnosis not present

## 2013-03-01 DIAGNOSIS — IMO0001 Reserved for inherently not codable concepts without codable children: Secondary | ICD-10-CM | POA: Diagnosis not present

## 2013-03-04 DIAGNOSIS — IMO0001 Reserved for inherently not codable concepts without codable children: Secondary | ICD-10-CM | POA: Diagnosis not present

## 2013-03-04 DIAGNOSIS — R262 Difficulty in walking, not elsewhere classified: Secondary | ICD-10-CM | POA: Diagnosis not present

## 2013-03-06 DIAGNOSIS — IMO0001 Reserved for inherently not codable concepts without codable children: Secondary | ICD-10-CM | POA: Diagnosis not present

## 2013-03-06 DIAGNOSIS — R262 Difficulty in walking, not elsewhere classified: Secondary | ICD-10-CM | POA: Diagnosis not present

## 2013-03-08 DIAGNOSIS — IMO0001 Reserved for inherently not codable concepts without codable children: Secondary | ICD-10-CM | POA: Diagnosis not present

## 2013-03-08 DIAGNOSIS — R262 Difficulty in walking, not elsewhere classified: Secondary | ICD-10-CM | POA: Diagnosis not present

## 2013-03-11 DIAGNOSIS — IMO0001 Reserved for inherently not codable concepts without codable children: Secondary | ICD-10-CM | POA: Diagnosis not present

## 2013-03-11 DIAGNOSIS — R262 Difficulty in walking, not elsewhere classified: Secondary | ICD-10-CM | POA: Diagnosis not present

## 2013-03-13 DIAGNOSIS — R262 Difficulty in walking, not elsewhere classified: Secondary | ICD-10-CM | POA: Diagnosis not present

## 2013-03-13 DIAGNOSIS — IMO0001 Reserved for inherently not codable concepts without codable children: Secondary | ICD-10-CM | POA: Diagnosis not present

## 2013-03-16 DIAGNOSIS — IMO0001 Reserved for inherently not codable concepts without codable children: Secondary | ICD-10-CM | POA: Diagnosis not present

## 2013-03-16 DIAGNOSIS — R262 Difficulty in walking, not elsewhere classified: Secondary | ICD-10-CM | POA: Diagnosis not present

## 2013-03-17 DIAGNOSIS — R262 Difficulty in walking, not elsewhere classified: Secondary | ICD-10-CM | POA: Diagnosis not present

## 2013-03-17 DIAGNOSIS — IMO0001 Reserved for inherently not codable concepts without codable children: Secondary | ICD-10-CM | POA: Diagnosis not present

## 2013-03-20 DIAGNOSIS — IMO0001 Reserved for inherently not codable concepts without codable children: Secondary | ICD-10-CM | POA: Diagnosis not present

## 2013-03-20 DIAGNOSIS — R262 Difficulty in walking, not elsewhere classified: Secondary | ICD-10-CM | POA: Diagnosis not present

## 2013-03-23 DIAGNOSIS — R262 Difficulty in walking, not elsewhere classified: Secondary | ICD-10-CM | POA: Diagnosis not present

## 2013-03-23 DIAGNOSIS — IMO0001 Reserved for inherently not codable concepts without codable children: Secondary | ICD-10-CM | POA: Diagnosis not present

## 2013-03-26 DIAGNOSIS — IMO0001 Reserved for inherently not codable concepts without codable children: Secondary | ICD-10-CM | POA: Diagnosis not present

## 2013-03-26 DIAGNOSIS — R262 Difficulty in walking, not elsewhere classified: Secondary | ICD-10-CM | POA: Diagnosis not present

## 2013-03-27 DIAGNOSIS — R262 Difficulty in walking, not elsewhere classified: Secondary | ICD-10-CM | POA: Diagnosis not present

## 2013-03-27 DIAGNOSIS — IMO0001 Reserved for inherently not codable concepts without codable children: Secondary | ICD-10-CM | POA: Diagnosis not present

## 2013-03-30 DIAGNOSIS — R262 Difficulty in walking, not elsewhere classified: Secondary | ICD-10-CM | POA: Diagnosis not present

## 2013-03-30 DIAGNOSIS — IMO0001 Reserved for inherently not codable concepts without codable children: Secondary | ICD-10-CM | POA: Diagnosis not present

## 2013-04-01 DIAGNOSIS — IMO0001 Reserved for inherently not codable concepts without codable children: Secondary | ICD-10-CM | POA: Diagnosis not present

## 2013-04-01 DIAGNOSIS — R262 Difficulty in walking, not elsewhere classified: Secondary | ICD-10-CM | POA: Diagnosis not present

## 2013-04-02 DIAGNOSIS — R262 Difficulty in walking, not elsewhere classified: Secondary | ICD-10-CM | POA: Diagnosis not present

## 2013-04-02 DIAGNOSIS — IMO0001 Reserved for inherently not codable concepts without codable children: Secondary | ICD-10-CM | POA: Diagnosis not present

## 2013-04-04 DIAGNOSIS — IMO0001 Reserved for inherently not codable concepts without codable children: Secondary | ICD-10-CM | POA: Diagnosis not present

## 2013-04-04 DIAGNOSIS — R262 Difficulty in walking, not elsewhere classified: Secondary | ICD-10-CM | POA: Diagnosis not present

## 2013-04-08 DIAGNOSIS — IMO0001 Reserved for inherently not codable concepts without codable children: Secondary | ICD-10-CM | POA: Diagnosis not present

## 2013-04-08 DIAGNOSIS — R262 Difficulty in walking, not elsewhere classified: Secondary | ICD-10-CM | POA: Diagnosis not present

## 2013-04-10 DIAGNOSIS — R262 Difficulty in walking, not elsewhere classified: Secondary | ICD-10-CM | POA: Diagnosis not present

## 2013-04-10 DIAGNOSIS — IMO0001 Reserved for inherently not codable concepts without codable children: Secondary | ICD-10-CM | POA: Diagnosis not present

## 2013-04-12 DIAGNOSIS — R262 Difficulty in walking, not elsewhere classified: Secondary | ICD-10-CM | POA: Diagnosis not present

## 2013-04-12 DIAGNOSIS — IMO0001 Reserved for inherently not codable concepts without codable children: Secondary | ICD-10-CM | POA: Diagnosis not present

## 2013-04-15 DIAGNOSIS — R262 Difficulty in walking, not elsewhere classified: Secondary | ICD-10-CM | POA: Diagnosis not present

## 2013-04-15 DIAGNOSIS — IMO0001 Reserved for inherently not codable concepts without codable children: Secondary | ICD-10-CM | POA: Diagnosis not present

## 2013-04-19 DIAGNOSIS — IMO0001 Reserved for inherently not codable concepts without codable children: Secondary | ICD-10-CM | POA: Diagnosis not present

## 2013-04-19 DIAGNOSIS — R262 Difficulty in walking, not elsewhere classified: Secondary | ICD-10-CM | POA: Diagnosis not present

## 2013-04-19 DIAGNOSIS — R32 Unspecified urinary incontinence: Secondary | ICD-10-CM | POA: Diagnosis not present

## 2013-04-20 DIAGNOSIS — IMO0001 Reserved for inherently not codable concepts without codable children: Secondary | ICD-10-CM | POA: Diagnosis not present

## 2013-04-20 DIAGNOSIS — R262 Difficulty in walking, not elsewhere classified: Secondary | ICD-10-CM | POA: Diagnosis not present

## 2013-04-22 DIAGNOSIS — IMO0001 Reserved for inherently not codable concepts without codable children: Secondary | ICD-10-CM | POA: Diagnosis not present

## 2013-04-22 DIAGNOSIS — R262 Difficulty in walking, not elsewhere classified: Secondary | ICD-10-CM | POA: Diagnosis not present

## 2013-04-24 DIAGNOSIS — R262 Difficulty in walking, not elsewhere classified: Secondary | ICD-10-CM | POA: Diagnosis not present

## 2013-04-24 DIAGNOSIS — IMO0001 Reserved for inherently not codable concepts without codable children: Secondary | ICD-10-CM | POA: Diagnosis not present

## 2013-04-26 DIAGNOSIS — R262 Difficulty in walking, not elsewhere classified: Secondary | ICD-10-CM | POA: Diagnosis not present

## 2013-04-26 DIAGNOSIS — IMO0001 Reserved for inherently not codable concepts without codable children: Secondary | ICD-10-CM | POA: Diagnosis not present

## 2013-04-29 DIAGNOSIS — R262 Difficulty in walking, not elsewhere classified: Secondary | ICD-10-CM | POA: Diagnosis not present

## 2013-04-29 DIAGNOSIS — IMO0001 Reserved for inherently not codable concepts without codable children: Secondary | ICD-10-CM | POA: Diagnosis not present

## 2013-04-30 DIAGNOSIS — IMO0001 Reserved for inherently not codable concepts without codable children: Secondary | ICD-10-CM | POA: Diagnosis not present

## 2013-04-30 DIAGNOSIS — R262 Difficulty in walking, not elsewhere classified: Secondary | ICD-10-CM | POA: Diagnosis not present

## 2013-05-01 ENCOUNTER — Encounter: Payer: Self-pay | Admitting: Podiatry

## 2013-05-01 ENCOUNTER — Ambulatory Visit (INDEPENDENT_AMBULATORY_CARE_PROVIDER_SITE_OTHER): Payer: Medicare Other | Admitting: Podiatry

## 2013-05-01 VITALS — BP 166/79 | HR 64 | Resp 12

## 2013-05-01 DIAGNOSIS — B351 Tinea unguium: Secondary | ICD-10-CM

## 2013-05-01 DIAGNOSIS — M79609 Pain in unspecified limb: Secondary | ICD-10-CM | POA: Diagnosis not present

## 2013-05-01 NOTE — Patient Instructions (Signed)
I recommend custom molded shoes with heat molded multi-laminated insoles for the indication of: Diabetic neuropathy Flatfoot deformity with hyperpronation Gait disturbance  Richard C.Amalia Hailey, DPM [facility]

## 2013-05-01 NOTE — Progress Notes (Signed)
   Subjective:    Patient ID: Lisa Villegas, female    DOB: [birth year], 80 y.o.   MRN: [id number]  HPI ''TOENAILS TRIM AND DIABETIC FEET. ALSO, WANTS DIABETIC SHOES.''  This patient presents with her caregiver who is her power of attorney of attorney. The patient is complaining of uncomfortable toenails, and requesting a replacement pair of molded diabetic shoes. Her medical doctor, according to her power of attorney has refused to the examiner feet and referred her to a podiatrist.  This patient is a type I diabetic and was last seen on 11/23/2011 for trauma to the right foot  Review of Systems  Constitutional: Positive for unexpected weight change.  Musculoskeletal: Positive for gait problem.  All other systems reviewed and are negative.       Objective:   Physical Exam  A white female appears orientated x3  Vascular: The DP and PT pulses are 2/4 bilaterally  Neurological: Sensation to 10 g monofilament wire intact 7/10 right and 8/10 left. Vibratory sensation diminished bilaterally. Knee reflex is nonreactive bilaterally. Ankle reflexes trace reactive bilaterally.  Dermatological: Hypertrophic, elongated, discolored toenails x10  Musculoskeletal: Patient has unstable gait walking with feet in a maximally pronated position.       Assessment & Plan:   Assessment: Diabetic peripheral neuropathy Gait disturbance bilaterally Hyperpronation bilaterally Symptomatic onychomycoses x10  Plan: Nails x10 are debrided back without any bleeding. Under physician visits summary advised the following  Custom molded shoes, with custom inserts for the indication of: Diabetic peripheral neuropathy Flatfoot/pes planus Gait disturbance.  Her caregiver will provide this information to her primary care physician so that they can referred to an orthotist who is made the shoes for this patient previously. I informed the caregiver that decertifying physician who trace the patient for diabetes  ultimately must issue this request to the orthotist.

## 2013-05-02 DIAGNOSIS — R262 Difficulty in walking, not elsewhere classified: Secondary | ICD-10-CM | POA: Diagnosis not present

## 2013-05-02 DIAGNOSIS — IMO0001 Reserved for inherently not codable concepts without codable children: Secondary | ICD-10-CM | POA: Diagnosis not present

## 2013-05-06 DIAGNOSIS — R262 Difficulty in walking, not elsewhere classified: Secondary | ICD-10-CM | POA: Diagnosis not present

## 2013-05-06 DIAGNOSIS — IMO0001 Reserved for inherently not codable concepts without codable children: Secondary | ICD-10-CM | POA: Diagnosis not present

## 2013-05-20 DIAGNOSIS — J984 Other disorders of lung: Secondary | ICD-10-CM | POA: Diagnosis not present

## 2013-05-20 DIAGNOSIS — J019 Acute sinusitis, unspecified: Secondary | ICD-10-CM | POA: Diagnosis not present

## 2013-05-20 DIAGNOSIS — E1129 Type 2 diabetes mellitus with other diabetic kidney complication: Secondary | ICD-10-CM | POA: Diagnosis not present

## 2013-05-20 DIAGNOSIS — I10 Essential (primary) hypertension: Secondary | ICD-10-CM | POA: Diagnosis not present

## 2013-05-20 DIAGNOSIS — M949 Disorder of cartilage, unspecified: Secondary | ICD-10-CM | POA: Diagnosis not present

## 2013-05-20 DIAGNOSIS — M899 Disorder of bone, unspecified: Secondary | ICD-10-CM | POA: Diagnosis not present

## 2013-05-27 DIAGNOSIS — R339 Retention of urine, unspecified: Secondary | ICD-10-CM | POA: Diagnosis not present

## 2013-05-27 DIAGNOSIS — N3942 Incontinence without sensory awareness: Secondary | ICD-10-CM | POA: Diagnosis not present

## 2013-05-27 DIAGNOSIS — R351 Nocturia: Secondary | ICD-10-CM | POA: Diagnosis not present

## 2013-05-28 DIAGNOSIS — I509 Heart failure, unspecified: Secondary | ICD-10-CM | POA: Diagnosis not present

## 2013-05-28 DIAGNOSIS — F329 Major depressive disorder, single episode, unspecified: Secondary | ICD-10-CM | POA: Diagnosis not present

## 2013-05-28 DIAGNOSIS — I10 Essential (primary) hypertension: Secondary | ICD-10-CM | POA: Diagnosis not present

## 2013-05-28 DIAGNOSIS — E1129 Type 2 diabetes mellitus with other diabetic kidney complication: Secondary | ICD-10-CM | POA: Diagnosis not present

## 2013-07-03 DIAGNOSIS — R404 Transient alteration of awareness: Secondary | ICD-10-CM | POA: Diagnosis not present

## 2013-07-03 DIAGNOSIS — N390 Urinary tract infection, site not specified: Secondary | ICD-10-CM | POA: Diagnosis not present

## 2013-07-12 DIAGNOSIS — R339 Retention of urine, unspecified: Secondary | ICD-10-CM | POA: Diagnosis not present

## 2013-07-12 DIAGNOSIS — R351 Nocturia: Secondary | ICD-10-CM | POA: Diagnosis not present

## 2013-07-12 DIAGNOSIS — N3942 Incontinence without sensory awareness: Secondary | ICD-10-CM | POA: Diagnosis not present

## 2013-07-23 DIAGNOSIS — F919 Conduct disorder, unspecified: Secondary | ICD-10-CM | POA: Diagnosis not present

## 2013-07-23 DIAGNOSIS — N390 Urinary tract infection, site not specified: Secondary | ICD-10-CM | POA: Diagnosis not present

## 2013-07-23 DIAGNOSIS — I10 Essential (primary) hypertension: Secondary | ICD-10-CM | POA: Diagnosis not present

## 2013-07-23 DIAGNOSIS — R159 Full incontinence of feces: Secondary | ICD-10-CM | POA: Diagnosis not present

## 2013-08-01 DIAGNOSIS — F29 Unspecified psychosis not due to a substance or known physiological condition: Secondary | ICD-10-CM | POA: Diagnosis not present

## 2013-09-18 DIAGNOSIS — R262 Difficulty in walking, not elsewhere classified: Secondary | ICD-10-CM | POA: Diagnosis not present

## 2013-09-18 DIAGNOSIS — M6281 Muscle weakness (generalized): Secondary | ICD-10-CM | POA: Diagnosis not present

## 2013-09-18 DIAGNOSIS — Z9181 History of falling: Secondary | ICD-10-CM | POA: Diagnosis not present

## 2013-09-18 DIAGNOSIS — IMO0001 Reserved for inherently not codable concepts without codable children: Secondary | ICD-10-CM | POA: Diagnosis not present

## 2013-09-18 DIAGNOSIS — E782 Mixed hyperlipidemia: Secondary | ICD-10-CM | POA: Diagnosis not present

## 2013-09-19 DIAGNOSIS — R262 Difficulty in walking, not elsewhere classified: Secondary | ICD-10-CM | POA: Diagnosis not present

## 2013-09-19 DIAGNOSIS — Z9181 History of falling: Secondary | ICD-10-CM | POA: Diagnosis not present

## 2013-09-19 DIAGNOSIS — E782 Mixed hyperlipidemia: Secondary | ICD-10-CM | POA: Diagnosis not present

## 2013-09-19 DIAGNOSIS — IMO0001 Reserved for inherently not codable concepts without codable children: Secondary | ICD-10-CM | POA: Diagnosis not present

## 2013-09-19 DIAGNOSIS — M6281 Muscle weakness (generalized): Secondary | ICD-10-CM | POA: Diagnosis not present

## 2013-09-21 DIAGNOSIS — IMO0001 Reserved for inherently not codable concepts without codable children: Secondary | ICD-10-CM | POA: Diagnosis not present

## 2013-09-21 DIAGNOSIS — R262 Difficulty in walking, not elsewhere classified: Secondary | ICD-10-CM | POA: Diagnosis not present

## 2013-09-21 DIAGNOSIS — E782 Mixed hyperlipidemia: Secondary | ICD-10-CM | POA: Diagnosis not present

## 2013-09-21 DIAGNOSIS — M6281 Muscle weakness (generalized): Secondary | ICD-10-CM | POA: Diagnosis not present

## 2013-09-21 DIAGNOSIS — Z9181 History of falling: Secondary | ICD-10-CM | POA: Diagnosis not present

## 2013-09-23 DIAGNOSIS — IMO0001 Reserved for inherently not codable concepts without codable children: Secondary | ICD-10-CM | POA: Diagnosis not present

## 2013-09-23 DIAGNOSIS — M6281 Muscle weakness (generalized): Secondary | ICD-10-CM | POA: Diagnosis not present

## 2013-09-23 DIAGNOSIS — E782 Mixed hyperlipidemia: Secondary | ICD-10-CM | POA: Diagnosis not present

## 2013-09-23 DIAGNOSIS — R262 Difficulty in walking, not elsewhere classified: Secondary | ICD-10-CM | POA: Diagnosis not present

## 2013-09-23 DIAGNOSIS — Z9181 History of falling: Secondary | ICD-10-CM | POA: Diagnosis not present

## 2013-09-24 DIAGNOSIS — M25579 Pain in unspecified ankle and joints of unspecified foot: Secondary | ICD-10-CM | POA: Diagnosis not present

## 2013-09-25 ENCOUNTER — Encounter: Payer: Self-pay | Admitting: Podiatry

## 2013-09-25 ENCOUNTER — Ambulatory Visit (INDEPENDENT_AMBULATORY_CARE_PROVIDER_SITE_OTHER): Payer: Medicare Other | Admitting: Podiatry

## 2013-09-25 VITALS — BP 154/74 | HR 88 | Resp 12

## 2013-09-25 DIAGNOSIS — R262 Difficulty in walking, not elsewhere classified: Secondary | ICD-10-CM | POA: Diagnosis not present

## 2013-09-25 DIAGNOSIS — B351 Tinea unguium: Secondary | ICD-10-CM

## 2013-09-25 DIAGNOSIS — M79609 Pain in unspecified limb: Secondary | ICD-10-CM | POA: Diagnosis not present

## 2013-09-25 DIAGNOSIS — E782 Mixed hyperlipidemia: Secondary | ICD-10-CM | POA: Diagnosis not present

## 2013-09-25 DIAGNOSIS — Z9181 History of falling: Secondary | ICD-10-CM | POA: Diagnosis not present

## 2013-09-25 DIAGNOSIS — M6281 Muscle weakness (generalized): Secondary | ICD-10-CM | POA: Diagnosis not present

## 2013-09-25 DIAGNOSIS — M79673 Pain in unspecified foot: Secondary | ICD-10-CM

## 2013-09-25 DIAGNOSIS — IMO0001 Reserved for inherently not codable concepts without codable children: Secondary | ICD-10-CM | POA: Diagnosis not present

## 2013-09-25 NOTE — Progress Notes (Signed)
   Subjective:    Patient ID: Lisa Villegas, female    DOB: 1930-04-23, 80 y.o.   MRN: [id number]  HPI NEW PROBLEM:  BOTH BOTTOM OF THE FEET ARE NOT HURTING BUT HAVING DISCOMFORT WHEN WALKING. THE FEET BEEN THE SAME AND IT VARIES. THE FEET GET AGGRAVATED WHEN WALKING BARE FOOT AND TRIED NO TREATMENT. The patient's caregiver said that Lyrica was discontinued which resulted in unpleasant feelings in the patient's feet. She then removed or custom molded shoes and walk without them. Lyrica has been restarted and patient is wearing custom molded shoes.  Review of Systems  Genitourinary: Positive for frequency.  Musculoskeletal: Positive for gait problem.  All other systems reviewed and are negative.      Objective:   Physical Exam  White female appears responsive and answering questions presents with her caregiver who is the power of attorney.  Vascular: DP and PT pulses 2/4 bilaterally  Dermatological: Incurvated, elongated hypertrophic toenails x10 No skin lesions are noted in the feet or legs bilaterally. There is no erythema, edema or warmth noted in the right and left feet  Musculoskeletal: Pass planus with associated hypermobility bilaterally      Assessment & Plan:   Assessment: Patient's foot pain most likely associated with neuropathic pain which is currently under treatment with Lyrica Hyperpronation/pes planus bilaterally Onychomycoses 6-10  Plan: Patient and caregiver advised to wear molded shoes at all times when walking and standing Debrided toenails 6-10 without a bleeding  Reappoint at patient her caregivers request

## 2013-09-27 DIAGNOSIS — M6281 Muscle weakness (generalized): Secondary | ICD-10-CM | POA: Diagnosis not present

## 2013-09-27 DIAGNOSIS — E782 Mixed hyperlipidemia: Secondary | ICD-10-CM | POA: Diagnosis not present

## 2013-09-27 DIAGNOSIS — R262 Difficulty in walking, not elsewhere classified: Secondary | ICD-10-CM | POA: Diagnosis not present

## 2013-09-27 DIAGNOSIS — Z9181 History of falling: Secondary | ICD-10-CM | POA: Diagnosis not present

## 2013-09-27 DIAGNOSIS — IMO0001 Reserved for inherently not codable concepts without codable children: Secondary | ICD-10-CM | POA: Diagnosis not present

## 2013-10-01 DIAGNOSIS — Z9181 History of falling: Secondary | ICD-10-CM | POA: Diagnosis not present

## 2013-10-01 DIAGNOSIS — R609 Edema, unspecified: Secondary | ICD-10-CM | POA: Diagnosis not present

## 2013-10-02 DIAGNOSIS — IMO0001 Reserved for inherently not codable concepts without codable children: Secondary | ICD-10-CM | POA: Diagnosis not present

## 2013-10-02 DIAGNOSIS — Z9181 History of falling: Secondary | ICD-10-CM | POA: Diagnosis not present

## 2013-10-02 DIAGNOSIS — M6281 Muscle weakness (generalized): Secondary | ICD-10-CM | POA: Diagnosis not present

## 2013-10-02 DIAGNOSIS — R262 Difficulty in walking, not elsewhere classified: Secondary | ICD-10-CM | POA: Diagnosis not present

## 2013-10-02 DIAGNOSIS — E782 Mixed hyperlipidemia: Secondary | ICD-10-CM | POA: Diagnosis not present

## 2013-10-03 DIAGNOSIS — E782 Mixed hyperlipidemia: Secondary | ICD-10-CM | POA: Diagnosis not present

## 2013-10-03 DIAGNOSIS — M6281 Muscle weakness (generalized): Secondary | ICD-10-CM | POA: Diagnosis not present

## 2013-10-03 DIAGNOSIS — IMO0001 Reserved for inherently not codable concepts without codable children: Secondary | ICD-10-CM | POA: Diagnosis not present

## 2013-10-03 DIAGNOSIS — R262 Difficulty in walking, not elsewhere classified: Secondary | ICD-10-CM | POA: Diagnosis not present

## 2013-10-03 DIAGNOSIS — Z9181 History of falling: Secondary | ICD-10-CM | POA: Diagnosis not present

## 2013-10-04 DIAGNOSIS — IMO0001 Reserved for inherently not codable concepts without codable children: Secondary | ICD-10-CM | POA: Diagnosis not present

## 2013-10-04 DIAGNOSIS — M6281 Muscle weakness (generalized): Secondary | ICD-10-CM | POA: Diagnosis not present

## 2013-10-04 DIAGNOSIS — Z9181 History of falling: Secondary | ICD-10-CM | POA: Diagnosis not present

## 2013-10-04 DIAGNOSIS — R262 Difficulty in walking, not elsewhere classified: Secondary | ICD-10-CM | POA: Diagnosis not present

## 2013-10-04 DIAGNOSIS — E782 Mixed hyperlipidemia: Secondary | ICD-10-CM | POA: Diagnosis not present

## 2013-10-15 DIAGNOSIS — E119 Type 2 diabetes mellitus without complications: Secondary | ICD-10-CM | POA: Diagnosis not present

## 2013-10-15 DIAGNOSIS — R609 Edema, unspecified: Secondary | ICD-10-CM | POA: Diagnosis not present

## 2013-10-31 ENCOUNTER — Other Ambulatory Visit: Payer: Self-pay

## 2013-10-31 DIAGNOSIS — Z1231 Encounter for screening mammogram for malignant neoplasm of breast: Secondary | ICD-10-CM

## 2013-11-27 DIAGNOSIS — I10 Essential (primary) hypertension: Secondary | ICD-10-CM | POA: Diagnosis not present

## 2013-11-27 DIAGNOSIS — E663 Overweight: Secondary | ICD-10-CM | POA: Diagnosis not present

## 2013-11-27 DIAGNOSIS — M949 Disorder of cartilage, unspecified: Secondary | ICD-10-CM | POA: Diagnosis not present

## 2013-11-27 DIAGNOSIS — E1129 Type 2 diabetes mellitus with other diabetic kidney complication: Secondary | ICD-10-CM | POA: Diagnosis not present

## 2013-11-27 DIAGNOSIS — Z1331 Encounter for screening for depression: Secondary | ICD-10-CM | POA: Diagnosis not present

## 2013-11-27 DIAGNOSIS — Z0000 Encounter for general adult medical examination without abnormal findings: Secondary | ICD-10-CM | POA: Diagnosis not present

## 2013-11-27 DIAGNOSIS — E1165 Type 2 diabetes mellitus with hyperglycemia: Secondary | ICD-10-CM | POA: Diagnosis not present

## 2013-11-27 DIAGNOSIS — M899 Disorder of bone, unspecified: Secondary | ICD-10-CM | POA: Diagnosis not present

## 2013-12-04 DIAGNOSIS — E785 Hyperlipidemia, unspecified: Secondary | ICD-10-CM | POA: Diagnosis not present

## 2013-12-04 DIAGNOSIS — E1165 Type 2 diabetes mellitus with hyperglycemia: Secondary | ICD-10-CM | POA: Diagnosis not present

## 2013-12-04 DIAGNOSIS — K219 Gastro-esophageal reflux disease without esophagitis: Secondary | ICD-10-CM | POA: Diagnosis not present

## 2013-12-04 DIAGNOSIS — I509 Heart failure, unspecified: Secondary | ICD-10-CM | POA: Diagnosis not present

## 2013-12-04 DIAGNOSIS — E1129 Type 2 diabetes mellitus with other diabetic kidney complication: Secondary | ICD-10-CM | POA: Diagnosis not present

## 2013-12-04 DIAGNOSIS — N183 Chronic kidney disease, stage 3 unspecified: Secondary | ICD-10-CM | POA: Diagnosis not present

## 2013-12-16 ENCOUNTER — Ambulatory Visit: Payer: Medicare Other

## 2013-12-20 DIAGNOSIS — I509 Heart failure, unspecified: Secondary | ICD-10-CM | POA: Diagnosis not present

## 2013-12-20 DIAGNOSIS — W19XXXA Unspecified fall, initial encounter: Secondary | ICD-10-CM | POA: Diagnosis not present

## 2013-12-20 DIAGNOSIS — E1129 Type 2 diabetes mellitus with other diabetic kidney complication: Secondary | ICD-10-CM | POA: Diagnosis not present

## 2013-12-20 DIAGNOSIS — R269 Unspecified abnormalities of gait and mobility: Secondary | ICD-10-CM | POA: Diagnosis not present

## 2013-12-26 ENCOUNTER — Ambulatory Visit
Admission: RE | Admit: 2013-12-26 | Discharge: 2013-12-26 | Disposition: A | Discharge status: Home / Self Care | Payer: Medicare Other | Source: Ambulatory Visit

## 2013-12-26 DIAGNOSIS — Z1231 Encounter for screening mammogram for malignant neoplasm of breast: Secondary | ICD-10-CM | POA: Diagnosis not present

## 2013-12-31 DIAGNOSIS — R2681 Unsteadiness on feet: Secondary | ICD-10-CM | POA: Diagnosis not present

## 2013-12-31 DIAGNOSIS — E669 Obesity, unspecified: Secondary | ICD-10-CM | POA: Diagnosis not present

## 2013-12-31 DIAGNOSIS — R609 Edema, unspecified: Secondary | ICD-10-CM | POA: Diagnosis not present

## 2013-12-31 DIAGNOSIS — W19XXXA Unspecified fall, initial encounter: Secondary | ICD-10-CM | POA: Diagnosis not present

## 2014-01-02 DIAGNOSIS — Z23 Encounter for immunization: Secondary | ICD-10-CM | POA: Diagnosis not present

## 2014-03-03 DIAGNOSIS — E785 Hyperlipidemia, unspecified: Secondary | ICD-10-CM | POA: Diagnosis not present

## 2014-03-03 DIAGNOSIS — E1121 Type 2 diabetes mellitus with diabetic nephropathy: Secondary | ICD-10-CM | POA: Diagnosis not present

## 2014-03-03 DIAGNOSIS — I5020 Unspecified systolic (congestive) heart failure: Secondary | ICD-10-CM | POA: Diagnosis not present

## 2014-03-03 DIAGNOSIS — M859 Disorder of bone density and structure, unspecified: Secondary | ICD-10-CM | POA: Diagnosis not present

## 2014-03-10 DIAGNOSIS — N183 Chronic kidney disease, stage 3 (moderate): Secondary | ICD-10-CM | POA: Diagnosis not present

## 2014-03-10 DIAGNOSIS — E1121 Type 2 diabetes mellitus with diabetic nephropathy: Secondary | ICD-10-CM | POA: Diagnosis not present

## 2014-03-10 DIAGNOSIS — E785 Hyperlipidemia, unspecified: Secondary | ICD-10-CM | POA: Diagnosis not present

## 2014-03-10 DIAGNOSIS — I10 Essential (primary) hypertension: Secondary | ICD-10-CM | POA: Diagnosis not present

## 2014-03-26 ENCOUNTER — Ambulatory Visit (INDEPENDENT_AMBULATORY_CARE_PROVIDER_SITE_OTHER): Payer: Medicare Other | Admitting: Podiatry

## 2014-03-26 ENCOUNTER — Encounter: Payer: Self-pay | Admitting: Podiatry

## 2014-03-26 VITALS — BP 137/70 | HR 72 | Resp 12

## 2014-03-26 DIAGNOSIS — B351 Tinea unguium: Secondary | ICD-10-CM

## 2014-03-26 DIAGNOSIS — B353 Tinea pedis: Secondary | ICD-10-CM

## 2014-03-26 NOTE — Progress Notes (Signed)
Patient ID: Lisa Villegas, female   DOB: 13-Dec-1930, 80 y.o.   MRN: [id number]  Subjective: This patient presents with her caregiver who is requesting debridement of her toenails. She also mentions a skin rash on the dorsal right intermetatarsal space.  Objective: The toenails are elongated, hypertrophic and incurvated 6-10 Dry  scaling skin  dorsal right first intermetatarsal space area without any drainage or warmth  Assessment: Onychomycoses 6-10 Tinea pedis right first intermetatarsal space  Plan: Debrided toenails 10 without a bleeding Ordered on patient's medication form and progress note: Clortrimazole cream apply twice a day to rash on right foot 30 days

## 2014-03-26 NOTE — Patient Instructions (Addendum)
Apply Clortrimazole cream twice a day 30 days to the rash on right foot

## 2014-05-08 DIAGNOSIS — R05 Cough: Secondary | ICD-10-CM | POA: Diagnosis not present

## 2014-06-13 DIAGNOSIS — E1121 Type 2 diabetes mellitus with diabetic nephropathy: Secondary | ICD-10-CM | POA: Diagnosis not present

## 2014-06-13 DIAGNOSIS — I10 Essential (primary) hypertension: Secondary | ICD-10-CM | POA: Diagnosis not present

## 2014-06-13 DIAGNOSIS — E785 Hyperlipidemia, unspecified: Secondary | ICD-10-CM | POA: Diagnosis not present

## 2014-06-13 DIAGNOSIS — M859 Disorder of bone density and structure, unspecified: Secondary | ICD-10-CM | POA: Diagnosis not present

## 2014-06-18 DIAGNOSIS — I10 Essential (primary) hypertension: Secondary | ICD-10-CM | POA: Diagnosis not present

## 2014-06-18 DIAGNOSIS — E785 Hyperlipidemia, unspecified: Secondary | ICD-10-CM | POA: Diagnosis not present

## 2014-06-18 DIAGNOSIS — E1165 Type 2 diabetes mellitus with hyperglycemia: Secondary | ICD-10-CM | POA: Diagnosis not present

## 2014-06-18 DIAGNOSIS — E1122 Type 2 diabetes mellitus with diabetic chronic kidney disease: Secondary | ICD-10-CM | POA: Diagnosis not present

## 2014-06-25 ENCOUNTER — Encounter: Payer: Self-pay | Admitting: Podiatry

## 2014-06-25 ENCOUNTER — Ambulatory Visit (INDEPENDENT_AMBULATORY_CARE_PROVIDER_SITE_OTHER): Payer: Medicare Other | Admitting: Podiatry

## 2014-06-25 DIAGNOSIS — B351 Tinea unguium: Secondary | ICD-10-CM

## 2014-06-25 DIAGNOSIS — M79673 Pain in unspecified foot: Secondary | ICD-10-CM

## 2014-06-25 NOTE — Progress Notes (Signed)
Patient ID: Lisa Villegas, female   DOB: October 01, 1930, 81 y.o.   MRN: [id number]  Subjective: This patient presents with her power of attorney who is requesting debridement of painful toenails  Objective: Pleasant confused patient presents with her power of attorney in the treatment room Skin texture and turgor were appears within normal limits bilaterally The toenails are elongated, incurvated, hypertrophic and tender direct palpation 6-10  Assessment: Symptomatic onychomycoses 6-10 History of: Diabetic Chronic kidney disease Hypertension  Plan Debridement of toenails 10 without any bleeding  Reappoint at three-month intervals:

## 2014-06-25 NOTE — Patient Instructions (Signed)
Diabetes and Foot Care Diabetes may cause you to have problems because of poor blood supply (circulation) to your feet and legs. This may cause the skin on your feet to become thinner, break easier, and heal more slowly. Your skin may become dry, and the skin may peel and crack. You may also have nerve damage in your legs and feet causing decreased feeling in them. You may not notice minor injuries to your feet that could lead to infections or more serious problems. Taking care of your feet is one of the most important things you can do for yourself.  HOME CARE INSTRUCTIONS  Wear shoes at all times, even in the house. Do not go barefoot. Bare feet are easily injured.  Check your feet daily for blisters, cuts, and redness. If you cannot see the bottom of your feet, use a mirror or ask someone for help.  Wash your feet with warm water (do not use hot water) and mild soap. Then pat your feet and the areas between your toes until they are completely dry. Do not soak your feet as this can dry your skin.  Apply a moisturizing lotion or petroleum jelly (that does not contain alcohol and is unscented) to the skin on your feet and to dry, brittle toenails. Do not apply lotion between your toes.  Trim your toenails straight across. Do not dig under them or around the cuticle. File the edges of your nails with an emery board or nail file.  Do not cut corns or calluses or try to remove them with medicine.  Wear clean socks or stockings every day. Make sure they are not too tight. Do not wear knee-high stockings since they may decrease blood flow to your legs.  Wear shoes that fit properly and have enough cushioning. To break in new shoes, wear them for just a few hours a day. This prevents you from injuring your feet. Always look in your shoes before you put them on to be sure there are no objects inside.  Do not cross your legs. This may decrease the blood flow to your feet.  If you find a minor scrape,  cut, or break in the skin on your feet, keep it and the skin around it clean and dry. These areas may be cleansed with mild soap and water. Do not cleanse the area with peroxide, alcohol, or iodine.  When you remove an adhesive bandage, be sure not to damage the skin around it.  If you have a wound, look at it several times a day to make sure it is healing.  Do not use heating pads or hot water bottles. They may burn your skin. If you have lost feeling in your feet or legs, you may not know it is happening until it is too late.  Make sure your health care provider performs a complete foot exam at least annually or more often if you have foot problems. Report any cuts, sores, or bruises to your health care provider immediately. SEEK MEDICAL CARE IF:   You have an injury that is not healing.  You have cuts or breaks in the skin.  You have an ingrown nail.  You notice redness on your legs or feet.  You feel burning or tingling in your legs or feet.  You have pain or cramps in your legs and feet.  Your legs or feet are numb.  Your feet always feel cold. SEEK IMMEDIATE MEDICAL CARE IF:   There is increasing redness,   swelling, or pain in or around a wound.  There is a red line that goes up your leg.  Pus is coming from a wound.  You develop a fever or as directed by your health care provider.  You notice a bad smell coming from an ulcer or wound. Document Released: 03/11/2000 Document Revised: 11/14/2012 Document Reviewed: 08/21/2012 Exit[employer] Patient Information 2015 [employer]. This information is not intended to replace advice given to you by your health care provider. Make sure you discuss any questions you have with your health care provider.  

## 2014-09-16 ENCOUNTER — Encounter: Payer: Self-pay | Admitting: Podiatry

## 2014-09-16 ENCOUNTER — Ambulatory Visit (INDEPENDENT_AMBULATORY_CARE_PROVIDER_SITE_OTHER): Payer: Medicare Other | Admitting: Podiatry

## 2014-09-16 DIAGNOSIS — B351 Tinea unguium: Secondary | ICD-10-CM | POA: Diagnosis not present

## 2014-09-16 DIAGNOSIS — E1122 Type 2 diabetes mellitus with diabetic chronic kidney disease: Secondary | ICD-10-CM | POA: Diagnosis not present

## 2014-09-16 DIAGNOSIS — M79676 Pain in unspecified toe(s): Secondary | ICD-10-CM | POA: Diagnosis not present

## 2014-09-16 DIAGNOSIS — E785 Hyperlipidemia, unspecified: Secondary | ICD-10-CM | POA: Diagnosis not present

## 2014-09-16 DIAGNOSIS — M8580 Other specified disorders of bone density and structure, unspecified site: Secondary | ICD-10-CM | POA: Diagnosis not present

## 2014-09-16 DIAGNOSIS — E559 Vitamin D deficiency, unspecified: Secondary | ICD-10-CM | POA: Diagnosis not present

## 2014-09-16 DIAGNOSIS — I10 Essential (primary) hypertension: Secondary | ICD-10-CM | POA: Diagnosis not present

## 2014-09-16 NOTE — Progress Notes (Signed)
   Subjective:    Patient ID: Lisa Villegas, female    DOB: 08-27-1930, 81 y.o.   MRN: [id number]  HPI  N-sharpe L - left medial side of great toe D- 2 days ago, sensory deprived O- suddenly but may have been there C-pain A- does not know T- no treatment Patient presents with power of attorney who is requesting nail debridement with symptoms in nails diffusely  He presents at approximately three-month intervals  Review of Systems  All other systems reviewed and are negative.      Objective:   Physical Exam  Patient appears pleasant however is not responsive to questioning  The toenails are elongated, incurvated, discolored with maximum deformity and left hallux. There is no surrounding erythema, edema, drainage in or around any the nail plates      Assessment & Plan:   Sesame: Onychomycoses with symptoms  Plan: Debridement toenails 10 without a bleeding  Reappoint 3 months

## 2014-09-16 NOTE — Patient Instructions (Signed)
Diabetes and Foot Care Diabetes may cause you to have problems because of poor blood supply (circulation) to your feet and legs. This may cause the skin on your feet to become thinner, break easier, and heal more slowly. Your skin may become dry, and the skin may peel and crack. You may also have nerve damage in your legs and feet causing decreased feeling in them. You may not notice minor injuries to your feet that could lead to infections or more serious problems. Taking care of your feet is one of the most important things you can do for yourself.  HOME CARE INSTRUCTIONS  Wear shoes at all times, even in the house. Do not go barefoot. Bare feet are easily injured.  Check your feet daily for blisters, cuts, and redness. If you cannot see the bottom of your feet, use a mirror or ask someone for help.  Wash your feet with warm water (do not use hot water) and mild soap. Then pat your feet and the areas between your toes until they are completely dry. Do not soak your feet as this can dry your skin.  Apply a moisturizing lotion or petroleum jelly (that does not contain alcohol and is unscented) to the skin on your feet and to dry, brittle toenails. Do not apply lotion between your toes.  Trim your toenails straight across. Do not dig under them or around the cuticle. File the edges of your nails with an emery board or nail file.  Do not cut corns or calluses or try to remove them with medicine.  Wear clean socks or stockings every day. Make sure they are not too tight. Do not wear knee-high stockings since they may decrease blood flow to your legs.  Wear shoes that fit properly and have enough cushioning. To break in new shoes, wear them for just a few hours a day. This prevents you from injuring your feet. Always look in your shoes before you put them on to be sure there are no objects inside.  Do not cross your legs. This may decrease the blood flow to your feet.  If you find a minor scrape,  cut, or break in the skin on your feet, keep it and the skin around it clean and dry. These areas may be cleansed with mild soap and water. Do not cleanse the area with peroxide, alcohol, or iodine.  When you remove an adhesive bandage, be sure not to damage the skin around it.  If you have a wound, look at it several times a day to make sure it is healing.  Do not use heating pads or hot water bottles. They may burn your skin. If you have lost feeling in your feet or legs, you may not know it is happening until it is too late.  Make sure your health care provider performs a complete foot exam at least annually or more often if you have foot problems. Report any cuts, sores, or bruises to your health care provider immediately. SEEK MEDICAL CARE IF:   You have an injury that is not healing.  You have cuts or breaks in the skin.  You have an ingrown nail.  You notice redness on your legs or feet.  You feel burning or tingling in your legs or feet.  You have pain or cramps in your legs and feet.  Your legs or feet are numb.  Your feet always feel cold. SEEK IMMEDIATE MEDICAL CARE IF:   There is increasing redness,   swelling, or pain in or around a wound.  There is a red line that goes up your leg.  Pus is coming from a wound.  You develop a fever or as directed by your health care provider.  You notice a bad smell coming from an ulcer or wound. Document Released: 03/11/2000 Document Revised: 11/14/2012 Document Reviewed: 08/21/2012 Exit[employer] Patient Information 2015 [employer]. This information is not intended to replace advice given to you by your health care provider. Make sure you discuss any questions you have with your health care provider.  

## 2014-09-24 ENCOUNTER — Ambulatory Visit: Payer: Medicare Other | Admitting: Podiatry

## 2014-09-25 DIAGNOSIS — E049 Nontoxic goiter, unspecified: Secondary | ICD-10-CM | POA: Diagnosis not present

## 2014-09-25 DIAGNOSIS — R32 Unspecified urinary incontinence: Secondary | ICD-10-CM | POA: Diagnosis not present

## 2014-09-25 DIAGNOSIS — F419 Anxiety disorder, unspecified: Secondary | ICD-10-CM | POA: Diagnosis not present

## 2014-09-25 DIAGNOSIS — K219 Gastro-esophageal reflux disease without esophagitis: Secondary | ICD-10-CM | POA: Diagnosis not present

## 2014-10-08 ENCOUNTER — Telehealth: Payer: Self-pay | Admitting: Podiatry

## 2014-10-08 NOTE — Telephone Encounter (Signed)
pts caregiver([name] Emilee Hero) called and pt is having a hard time walking and balance is off.She thinks it is her hip. She was asking if you could recommend an orthopedic doctor or what else would Dr Amalia Hailey recommend.. Please call [name]  back and advise.

## 2014-10-08 NOTE — Telephone Encounter (Signed)
[facility] recommended [facility].  I left a message on [name]'s phone.

## 2014-11-04 DIAGNOSIS — R269 Unspecified abnormalities of gait and mobility: Secondary | ICD-10-CM | POA: Diagnosis not present

## 2014-11-13 ENCOUNTER — Telehealth: Payer: Self-pay | Admitting: *Deleted

## 2014-11-13 NOTE — Telephone Encounter (Signed)
Request from [employer] and [employer] for diabetic shoes completed and faxed with copy 06/25/2014, 05/01/2013 exams, pt's insurance cards.

## 2014-12-24 ENCOUNTER — Ambulatory Visit: Payer: Medicare Other | Admitting: Podiatry

## 2015-01-07 DIAGNOSIS — Z23 Encounter for immunization: Secondary | ICD-10-CM | POA: Diagnosis not present

## 2015-01-12 ENCOUNTER — Other Ambulatory Visit: Payer: Self-pay

## 2015-01-12 DIAGNOSIS — Z1231 Encounter for screening mammogram for malignant neoplasm of breast: Secondary | ICD-10-CM

## 2015-01-14 ENCOUNTER — Ambulatory Visit (INDEPENDENT_AMBULATORY_CARE_PROVIDER_SITE_OTHER): Payer: Medicare Other | Admitting: Podiatry

## 2015-01-14 ENCOUNTER — Encounter: Payer: Self-pay | Admitting: Podiatry

## 2015-01-14 DIAGNOSIS — M79676 Pain in unspecified toe(s): Secondary | ICD-10-CM

## 2015-01-14 DIAGNOSIS — B351 Tinea unguium: Secondary | ICD-10-CM | POA: Diagnosis not present

## 2015-01-14 NOTE — Patient Instructions (Signed)
Today the POA requested foot examination for replacement  extra day after custom molded diabetic shoes notes should be available within 24 hours  Diabetes and Foot Care Diabetes may cause you to have problems because of poor blood supply (circulation) to your feet and legs. This may cause the skin on your feet to become thinner, break easier, and heal more slowly. Your skin may become dry, and the skin may peel and crack. You may also have nerve damage in your legs and feet causing decreased feeling in them. You may not notice minor injuries to your feet that could lead to infections or more serious problems. Taking care of your feet is one of the most important things you can do for yourself.  HOME CARE INSTRUCTIONS  Wear shoes at all times, even in the house. Do not go barefoot. Bare feet are easily injured.  Check your feet daily for blisters, cuts, and redness. If you cannot see the bottom of your feet, use a mirror or ask someone for help.  Wash your feet with warm water (do not use hot water) and mild soap. Then pat your feet and the areas between your toes until they are completely dry. Do not soak your feet as this can dry your skin.  Apply a moisturizing lotion or petroleum jelly (that does not contain alcohol and is unscented) to the skin on your feet and to dry, brittle toenails. Do not apply lotion between your toes.  Trim your toenails straight across. Do not dig under them or around the cuticle. File the edges of your nails with an emery board or nail file.  Do not cut corns or calluses or try to remove them with medicine.  Wear clean socks or stockings every day. Make sure they are not too tight. Do not wear knee-high stockings since they may decrease blood flow to your legs.  Wear shoes that fit properly and have enough cushioning. To break in new shoes, wear them for just a few hours a day. This prevents you from injuring your feet. Always look in your shoes before you put them  on to be sure there are no objects inside.  Do not cross your legs. This may decrease the blood flow to your feet.  If you find a minor scrape, cut, or break in the skin on your feet, keep it and the skin around it clean and dry. These areas may be cleansed with mild soap and water. Do not cleanse the area with peroxide, alcohol, or iodine.  When you remove an adhesive bandage, be sure not to damage the skin around it.  If you have a wound, look at it several times a day to make sure it is healing.  Do not use heating pads or hot water bottles. They may burn your skin. If you have lost feeling in your feet or legs, you may not know it is happening until it is too late.  Make sure your health care provider performs a complete foot exam at least annually or more often if you have foot problems. Report any cuts, sores, or bruises to your health care provider immediately. SEEK MEDICAL CARE IF:   You have an injury that is not healing.  You have cuts or breaks in the skin.  You have an ingrown nail.  You notice redness on your legs or feet.  You feel burning or tingling in your legs or feet.  You have pain or cramps in your legs and feet.  Your legs or feet are numb.  Your feet always feel cold. SEEK IMMEDIATE MEDICAL CARE IF:   There is increasing redness, swelling, or pain in or around a wound.  There is a red line that goes up your leg.  Pus is coming from a wound.  You develop a fever or as directed by your health care provider.  You notice a bad smell coming from an ulcer or wound.   This information is not intended to replace advice given to you by your health care provider. Make sure you discuss any questions you have with your health care provider.   Document Released: 03/11/2000 Document Revised: 11/14/2012 Document Reviewed: 08/21/2012 Elsevier Interactive Patient Education [employer].

## 2015-01-15 NOTE — Progress Notes (Signed)
Patient ID: Lisa Villegas, female   DOB: [birth year], 81 y.o.   MRN: [id number]  Subjective: This patient presents today complaining of uncomfortable toenails and walking wearing shoes and she request toenail debridement. Her power of attorney is in the room with patient today and is requesting certification for diabetic shoes. Patient wears custom molded diabetic shoes on ongoing continuous basis. She has worn a new shoes because of gait disturbance, history of diabetes and a history of neuropathy and maximally pronated feet. She also has a history of limb length inequality as well as asymmetrical foot size  Objective: Patient appears responsive to questioning  Vascular: No peripheral edema noted bilaterally DP pulses 2/4 bilaterally PT pulses 1/4 bilaterally Capillary reflex immediate bilaterally  Neurological: Vibratory sensation nonreactive bilaterally Sensation to 10 g monofilament wire intact 4/5 bilaterally Reflexes reactive bilaterally  Dermatological: The toenails are elongated, incurvated, discolored and tender to direct palpation 6-10 Open skin lesions bilaterally  Musculoskeletal: Pes planus with maximally pronated feet bilaterally Manual motor testing dorsi flexion, plantar flexion, inversion, eversion 5/5 bilaterally Left extremity appears slightly longer and right Patient has unstable gait pattern  Assessment: Diabetic with peripheral neuropathy Gait disturbance Maximally pronated feet bilaterally Symptomatic onychomycoses 6-10  Plan: Debridement toenails 10 without any bleeding  Indications for diabetic shoes include: Diabetic peripheral neuropathy Gait disturbance bilaterally Hyperpronation bilaterally  Rx custom molded diabetic shoes similar to existing shoes

## 2015-01-22 DIAGNOSIS — N183 Chronic kidney disease, stage 3 (moderate): Secondary | ICD-10-CM | POA: Diagnosis not present

## 2015-01-22 DIAGNOSIS — I509 Heart failure, unspecified: Secondary | ICD-10-CM | POA: Diagnosis not present

## 2015-01-22 DIAGNOSIS — F339 Major depressive disorder, recurrent, unspecified: Secondary | ICD-10-CM | POA: Diagnosis not present

## 2015-01-22 DIAGNOSIS — E1142 Type 2 diabetes mellitus with diabetic polyneuropathy: Secondary | ICD-10-CM | POA: Diagnosis not present

## 2015-01-27 DIAGNOSIS — I509 Heart failure, unspecified: Secondary | ICD-10-CM | POA: Diagnosis not present

## 2015-01-27 DIAGNOSIS — E785 Hyperlipidemia, unspecified: Secondary | ICD-10-CM | POA: Diagnosis not present

## 2015-01-27 DIAGNOSIS — F339 Major depressive disorder, recurrent, unspecified: Secondary | ICD-10-CM | POA: Diagnosis not present

## 2015-01-27 DIAGNOSIS — E1122 Type 2 diabetes mellitus with diabetic chronic kidney disease: Secondary | ICD-10-CM | POA: Diagnosis not present

## 2015-02-04 DIAGNOSIS — Z7984 Long term (current) use of oral hypoglycemic drugs: Secondary | ICD-10-CM | POA: Diagnosis not present

## 2015-02-04 DIAGNOSIS — E119 Type 2 diabetes mellitus without complications: Secondary | ICD-10-CM | POA: Diagnosis not present

## 2015-02-04 DIAGNOSIS — H35412 Lattice degeneration of retina, left eye: Secondary | ICD-10-CM | POA: Diagnosis not present

## 2015-02-04 LAB — HM DIABETES EYE EXAM

## 2015-02-10 ENCOUNTER — Ambulatory Visit
Admission: RE | Admit: 2015-02-10 | Discharge: 2015-02-10 | Disposition: A | Discharge status: Home / Self Care | Payer: Medicare Other | Source: Ambulatory Visit

## 2015-02-10 DIAGNOSIS — Z1231 Encounter for screening mammogram for malignant neoplasm of breast: Secondary | ICD-10-CM | POA: Diagnosis not present

## 2015-02-11 LAB — HM MAMMOGRAPHY

## 2015-04-01 DIAGNOSIS — Z79899 Other long term (current) drug therapy: Secondary | ICD-10-CM | POA: Diagnosis not present

## 2015-04-01 DIAGNOSIS — E1122 Type 2 diabetes mellitus with diabetic chronic kidney disease: Secondary | ICD-10-CM | POA: Diagnosis not present

## 2015-04-01 DIAGNOSIS — D649 Anemia, unspecified: Secondary | ICD-10-CM | POA: Diagnosis not present

## 2015-04-16 DIAGNOSIS — H01009 Unspecified blepharitis unspecified eye, unspecified eyelid: Secondary | ICD-10-CM | POA: Diagnosis not present

## 2015-04-21 DIAGNOSIS — K219 Gastro-esophageal reflux disease without esophagitis: Secondary | ICD-10-CM | POA: Diagnosis not present

## 2015-04-21 DIAGNOSIS — E1142 Type 2 diabetes mellitus with diabetic polyneuropathy: Secondary | ICD-10-CM | POA: Diagnosis not present

## 2015-04-21 DIAGNOSIS — R32 Unspecified urinary incontinence: Secondary | ICD-10-CM | POA: Diagnosis not present

## 2015-04-21 DIAGNOSIS — E785 Hyperlipidemia, unspecified: Secondary | ICD-10-CM | POA: Diagnosis not present

## 2015-04-28 ENCOUNTER — Ambulatory Visit (INDEPENDENT_AMBULATORY_CARE_PROVIDER_SITE_OTHER): Payer: Medicare Other | Admitting: Podiatry

## 2015-04-28 ENCOUNTER — Encounter: Payer: Self-pay | Admitting: Podiatry

## 2015-04-28 DIAGNOSIS — M79676 Pain in unspecified toe(s): Secondary | ICD-10-CM

## 2015-04-28 DIAGNOSIS — B351 Tinea unguium: Secondary | ICD-10-CM

## 2015-04-28 NOTE — Progress Notes (Signed)
Patient ID: Lisa Villegas, female   DOB: 11-Nov-1930, 82 y.o.   MRN: [id number]  Subjective: This patient presents with caregiver in the treatment room complaining of elongated toenails which are uncomfortable walking wearing shoes and request toenail debridement. Patient is pending replacement diabetic custom molded shoes  Objective: No open skin lesions bilaterally The toenails are elongated, hypertrophic, discolored and tender to direct palpation 6-10  Assessment: Symptomatic onychomycoses 6-10 Diabetic with peripheral neuropathy Gait disturbance Maximally pronated feet bilaterally  Plan: Debrided toenails 6-10 mechanically and electrically without any bleeding reappoint times three-month  Reappoint 3 months

## 2015-04-28 NOTE — Patient Instructions (Signed)
Diabetes and Foot Care Diabetes may cause you to have problems because of poor blood supply (circulation) to your feet and legs. This may cause the skin on your feet to become thinner, break easier, and heal more slowly. Your skin may become dry, and the skin may peel and crack. You may also have nerve damage in your legs and feet causing decreased feeling in them. You may not notice minor injuries to your feet that could lead to infections or more serious problems. Taking care of your feet is one of the most important things you can do for yourself.  HOME CARE INSTRUCTIONS  Wear shoes at all times, even in the house. Do not go barefoot. Bare feet are easily injured.  Check your feet daily for blisters, cuts, and redness. If you cannot see the bottom of your feet, use a mirror or ask someone for help.  Wash your feet with warm water (do not use hot water) and mild soap. Then pat your feet and the areas between your toes until they are completely dry. Do not soak your feet as this can dry your skin.  Apply a moisturizing lotion or petroleum jelly (that does not contain alcohol and is unscented) to the skin on your feet and to dry, brittle toenails. Do not apply lotion between your toes.  Trim your toenails straight across. Do not dig under them or around the cuticle. File the edges of your nails with an emery board or nail file.  Do not cut corns or calluses or try to remove them with medicine.  Wear clean socks or stockings every day. Make sure they are not too tight. Do not wear knee-high stockings since they may decrease blood flow to your legs.  Wear shoes that fit properly and have enough cushioning. To break in new shoes, wear them for just a few hours a day. This prevents you from injuring your feet. Always look in your shoes before you put them on to be sure there are no objects inside.  Do not cross your legs. This may decrease the blood flow to your feet.  If you find a minor scrape,  cut, or break in the skin on your feet, keep it and the skin around it clean and dry. These areas may be cleansed with mild soap and water. Do not cleanse the area with peroxide, alcohol, or iodine.  When you remove an adhesive bandage, be sure not to damage the skin around it.  If you have a wound, look at it several times a day to make sure it is healing.  Do not use heating pads or hot water bottles. They may burn your skin. If you have lost feeling in your feet or legs, you may not know it is happening until it is too late.  Make sure your health care provider performs a complete foot exam at least annually or more often if you have foot problems. Report any cuts, sores, or bruises to your health care provider immediately. SEEK MEDICAL CARE IF:   You have an injury that is not healing.  You have cuts or breaks in the skin.  You have an ingrown nail.  You notice redness on your legs or feet.  You feel burning or tingling in your legs or feet.  You have pain or cramps in your legs and feet.  Your legs or feet are numb.  Your feet always feel cold. SEEK IMMEDIATE MEDICAL CARE IF:   There is increasing redness,   swelling, or pain in or around a wound.  There is a red line that goes up your leg.  Pus is coming from a wound.  You develop a fever or as directed by your health care provider.  You notice a bad smell coming from an ulcer or wound.   This information is not intended to replace advice given to you by your health care provider. Make sure you discuss any questions you have with your health care provider.   Document Released: 03/11/2000 Document Revised: 11/14/2012 Document Reviewed: 08/21/2012 Elsevier Interactive Patient Education 2016 Elsevier Inc.  

## 2015-05-29 DIAGNOSIS — H1089 Other conjunctivitis: Secondary | ICD-10-CM | POA: Diagnosis not present

## 2015-07-14 DIAGNOSIS — Z23 Encounter for immunization: Secondary | ICD-10-CM | POA: Diagnosis not present

## 2015-07-14 DIAGNOSIS — Z0000 Encounter for general adult medical examination without abnormal findings: Secondary | ICD-10-CM | POA: Diagnosis not present

## 2015-07-14 DIAGNOSIS — E559 Vitamin D deficiency, unspecified: Secondary | ICD-10-CM | POA: Diagnosis not present

## 2015-07-14 DIAGNOSIS — I10 Essential (primary) hypertension: Secondary | ICD-10-CM | POA: Diagnosis not present

## 2015-07-14 DIAGNOSIS — E1142 Type 2 diabetes mellitus with diabetic polyneuropathy: Secondary | ICD-10-CM | POA: Diagnosis not present

## 2015-07-14 DIAGNOSIS — I509 Heart failure, unspecified: Secondary | ICD-10-CM | POA: Diagnosis not present

## 2015-07-14 DIAGNOSIS — M859 Disorder of bone density and structure, unspecified: Secondary | ICD-10-CM | POA: Diagnosis not present

## 2015-07-14 DIAGNOSIS — Z1389 Encounter for screening for other disorder: Secondary | ICD-10-CM | POA: Diagnosis not present

## 2015-07-14 DIAGNOSIS — M8580 Other specified disorders of bone density and structure, unspecified site: Secondary | ICD-10-CM | POA: Diagnosis not present

## 2015-07-14 LAB — HM DEXA SCAN

## 2015-07-23 DIAGNOSIS — E1122 Type 2 diabetes mellitus with diabetic chronic kidney disease: Secondary | ICD-10-CM | POA: Diagnosis not present

## 2015-07-23 DIAGNOSIS — F339 Major depressive disorder, recurrent, unspecified: Secondary | ICD-10-CM | POA: Diagnosis not present

## 2015-07-23 DIAGNOSIS — M8580 Other specified disorders of bone density and structure, unspecified site: Secondary | ICD-10-CM | POA: Diagnosis not present

## 2015-07-23 DIAGNOSIS — E785 Hyperlipidemia, unspecified: Secondary | ICD-10-CM | POA: Diagnosis not present

## 2015-07-23 DIAGNOSIS — I129 Hypertensive chronic kidney disease with stage 1 through stage 4 chronic kidney disease, or unspecified chronic kidney disease: Secondary | ICD-10-CM | POA: Diagnosis not present

## 2015-07-28 ENCOUNTER — Encounter: Payer: Self-pay | Admitting: Podiatry

## 2015-07-28 ENCOUNTER — Ambulatory Visit (INDEPENDENT_AMBULATORY_CARE_PROVIDER_SITE_OTHER): Payer: Medicare Other | Admitting: Podiatry

## 2015-07-28 DIAGNOSIS — B351 Tinea unguium: Secondary | ICD-10-CM

## 2015-07-28 DIAGNOSIS — M79676 Pain in unspecified toe(s): Secondary | ICD-10-CM | POA: Diagnosis not present

## 2015-07-28 NOTE — Patient Instructions (Signed)
Diabetes and Foot Care Diabetes may cause you to have problems because of poor blood supply (circulation) to your feet and legs. This may cause the skin on your feet to become thinner, break easier, and heal more slowly. Your skin may become dry, and the skin may peel and crack. You may also have nerve damage in your legs and feet causing decreased feeling in them. You may not notice minor injuries to your feet that could lead to infections or more serious problems. Taking care of your feet is one of the most important things you can do for yourself.  HOME CARE INSTRUCTIONS  Wear shoes at all times, even in the house. Do not go barefoot. Bare feet are easily injured.  Check your feet daily for blisters, cuts, and redness. If you cannot see the bottom of your feet, use a mirror or ask someone for help.  Wash your feet with warm water (do not use hot water) and mild soap. Then pat your feet and the areas between your toes until they are completely dry. Do not soak your feet as this can dry your skin.  Apply a moisturizing lotion or petroleum jelly (that does not contain alcohol and is unscented) to the skin on your feet and to dry, brittle toenails. Do not apply lotion between your toes.  Trim your toenails straight across. Do not dig under them or around the cuticle. File the edges of your nails with an emery board or nail file.  Do not cut corns or calluses or try to remove them with medicine.  Wear clean socks or stockings every day. Make sure they are not too tight. Do not wear knee-high stockings since they may decrease blood flow to your legs.  Wear shoes that fit properly and have enough cushioning. To break in new shoes, wear them for just a few hours a day. This prevents you from injuring your feet. Always look in your shoes before you put them on to be sure there are no objects inside.  Do not cross your legs. This may decrease the blood flow to your feet.  If you find a minor scrape,  cut, or break in the skin on your feet, keep it and the skin around it clean and dry. These areas may be cleansed with mild soap and water. Do not cleanse the area with peroxide, alcohol, or iodine.  When you remove an adhesive bandage, be sure not to damage the skin around it.  If you have a wound, look at it several times a day to make sure it is healing.  Do not use heating pads or hot water bottles. They may burn your skin. If you have lost feeling in your feet or legs, you may not know it is happening until it is too late.  Make sure your health care provider performs a complete foot exam at least annually or more often if you have foot problems. Report any cuts, sores, or bruises to your health care provider immediately. SEEK MEDICAL CARE IF:   You have an injury that is not healing.  You have cuts or breaks in the skin.  You have an ingrown nail.  You notice redness on your legs or feet.  You feel burning or tingling in your legs or feet.  You have pain or cramps in your legs and feet.  Your legs or feet are numb.  Your feet always feel cold. SEEK IMMEDIATE MEDICAL CARE IF:   There is increasing redness,   swelling, or pain in or around a wound.  There is a red line that goes up your leg.  Pus is coming from a wound.  You develop a fever or as directed by your health care provider.  You notice a bad smell coming from an ulcer or wound.   This information is not intended to replace advice given to you by your health care provider. Make sure you discuss any questions you have with your health care provider.   Document Released: 03/11/2000 Document Revised: 11/14/2012 Document Reviewed: 08/21/2012 Elsevier Interactive Patient Education 2016 Elsevier Inc.  

## 2015-07-28 NOTE — Progress Notes (Signed)
Patient ID: Lisa Villegas, female   DOB: 28-Nov-1930, 82 y.o.   MRN: [id number]  Subjective: This patient presents with caregiver in the treatment room complaining of elongated toenails which are uncomfortable walking wearing shoes and request toenail debridement.   Objective: No open skin lesions bilaterally The toenails are elongated, hypertrophic, discolored and tender to direct palpation 6-10  replacement diabetic custom molded shoes  Assessment: Symptomatic onychomycoses 6-10 Diabetic with peripheral neuropathy Gait disturbance Maximally pronated feet bilaterally  Plan: Debride toenails 6-10 mechanically and electrically without any bleeding reappoint times three-month  Reappoint 3 months

## 2015-09-26 LAB — HEMOGLOBIN A1C: Hemoglobin A1C: 7.1

## 2015-10-16 DIAGNOSIS — I129 Hypertensive chronic kidney disease with stage 1 through stage 4 chronic kidney disease, or unspecified chronic kidney disease: Secondary | ICD-10-CM | POA: Diagnosis not present

## 2015-10-16 DIAGNOSIS — E559 Vitamin D deficiency, unspecified: Secondary | ICD-10-CM | POA: Diagnosis not present

## 2015-10-16 DIAGNOSIS — E1122 Type 2 diabetes mellitus with diabetic chronic kidney disease: Secondary | ICD-10-CM | POA: Diagnosis not present

## 2015-10-16 DIAGNOSIS — M8580 Other specified disorders of bone density and structure, unspecified site: Secondary | ICD-10-CM | POA: Diagnosis not present

## 2015-10-16 DIAGNOSIS — E1165 Type 2 diabetes mellitus with hyperglycemia: Secondary | ICD-10-CM | POA: Diagnosis not present

## 2015-10-23 DIAGNOSIS — I129 Hypertensive chronic kidney disease with stage 1 through stage 4 chronic kidney disease, or unspecified chronic kidney disease: Secondary | ICD-10-CM | POA: Diagnosis not present

## 2015-10-23 DIAGNOSIS — F339 Major depressive disorder, recurrent, unspecified: Secondary | ICD-10-CM | POA: Diagnosis not present

## 2015-10-23 DIAGNOSIS — E1122 Type 2 diabetes mellitus with diabetic chronic kidney disease: Secondary | ICD-10-CM | POA: Diagnosis not present

## 2015-10-23 DIAGNOSIS — N183 Chronic kidney disease, stage 3 (moderate): Secondary | ICD-10-CM | POA: Diagnosis not present

## 2015-11-03 ENCOUNTER — Ambulatory Visit (INDEPENDENT_AMBULATORY_CARE_PROVIDER_SITE_OTHER): Payer: Medicare Other | Admitting: Podiatry

## 2015-11-03 ENCOUNTER — Encounter: Payer: Self-pay | Admitting: Podiatry

## 2015-11-03 DIAGNOSIS — B351 Tinea unguium: Secondary | ICD-10-CM | POA: Diagnosis not present

## 2015-11-03 DIAGNOSIS — M79676 Pain in unspecified toe(s): Secondary | ICD-10-CM

## 2015-11-03 NOTE — Patient Instructions (Signed)
Return every 3 months for debridement of toenails Lisa Villegas DPM Tried foot center 11/03/2015

## 2015-11-03 NOTE — Progress Notes (Signed)
Patient ID: Lisa Villegas, female   DOB: Mar 26, 1931, 82 y.o.   MRN: [id number]     Subjective: This patient presents with caregiver in the treatment room complaining of elongated toenails which are uncomfortable walking wearing shoes and request toenail debridement.   Objective: No open skin lesions bilaterally The toenails are elongated, hypertrophic, discolored and tender to direct palpation 6-10  replacement diabetic custom molded shoes  Assessment: Symptomatic onychomycoses 6-10 Diabetic with peripheral neuropathy Gait disturbance Maximally pronated feet bilaterally  Plan: Debride toenails 6-10 mechanically and electrically without any bleeding reappoint times three-month  Patient wears custom molded shoes  Reappoint 3 months

## 2015-11-24 ENCOUNTER — Other Ambulatory Visit: Payer: Self-pay

## 2015-12-14 DIAGNOSIS — D649 Anemia, unspecified: Secondary | ICD-10-CM | POA: Diagnosis not present

## 2015-12-14 DIAGNOSIS — R531 Weakness: Secondary | ICD-10-CM | POA: Diagnosis not present

## 2015-12-14 DIAGNOSIS — E1142 Type 2 diabetes mellitus with diabetic polyneuropathy: Secondary | ICD-10-CM | POA: Diagnosis not present

## 2015-12-15 DIAGNOSIS — R531 Weakness: Secondary | ICD-10-CM | POA: Diagnosis not present

## 2015-12-15 DIAGNOSIS — N183 Chronic kidney disease, stage 3 (moderate): Secondary | ICD-10-CM | POA: Diagnosis not present

## 2015-12-15 DIAGNOSIS — H109 Unspecified conjunctivitis: Secondary | ICD-10-CM | POA: Diagnosis not present

## 2015-12-15 DIAGNOSIS — D649 Anemia, unspecified: Secondary | ICD-10-CM | POA: Diagnosis not present

## 2015-12-28 DIAGNOSIS — Z23 Encounter for immunization: Secondary | ICD-10-CM | POA: Diagnosis not present

## 2016-01-01 ENCOUNTER — Encounter: Payer: Self-pay | Admitting: Family Medicine

## 2016-01-01 ENCOUNTER — Ambulatory Visit (INDEPENDENT_AMBULATORY_CARE_PROVIDER_SITE_OTHER): Payer: Medicare Other | Admitting: Family Medicine

## 2016-01-01 VITALS — BP 120/70 | HR 63 | Wt 141.6 lb

## 2016-01-01 DIAGNOSIS — Z7689 Persons encountering health services in other specified circumstances: Secondary | ICD-10-CM

## 2016-01-01 DIAGNOSIS — R159 Full incontinence of feces: Secondary | ICD-10-CM | POA: Diagnosis not present

## 2016-01-01 DIAGNOSIS — E785 Hyperlipidemia, unspecified: Secondary | ICD-10-CM

## 2016-01-01 DIAGNOSIS — N3942 Incontinence without sensory awareness: Secondary | ICD-10-CM

## 2016-01-01 DIAGNOSIS — F79 Unspecified intellectual disabilities: Secondary | ICD-10-CM | POA: Diagnosis not present

## 2016-01-01 DIAGNOSIS — E1129 Type 2 diabetes mellitus with other diabetic kidney complication: Secondary | ICD-10-CM | POA: Insufficient documentation

## 2016-01-01 DIAGNOSIS — E119 Type 2 diabetes mellitus without complications: Secondary | ICD-10-CM

## 2016-01-01 DIAGNOSIS — I10 Essential (primary) hypertension: Secondary | ICD-10-CM | POA: Diagnosis not present

## 2016-01-01 NOTE — Progress Notes (Signed)
   Subjective:    Patient ID: Lisa Villegas, female    DOB: 16-Nov-1930, 82 y.o.   MRN: [id number]  HPI Chief Complaint  Patient presents with  . new pt    new pt get established.    She is new to the practice and here to establish care.  She is here with her POA [name] who has been caring for her for 40 years.  Lives in [facility] assisted [facility].  Likes repetitive games- word search, crossword, scrabble, cards. Words with friends.  No sense of smell and difficulty with facial recognition.  No concerns or complaints today.   Previous medical care: Dr. Alesia Banda at [facility] for past 4 years. No records today.  Diabetes type 2- diagnosed about 15 years ago. Has been on insulin and Januvia in the past. Stopped Januvia 2 weeks ago due to appetite suppression.  Checks blood sugars.  Last A1C ?   Urinary and bowel incontinence: adult briefs. Uses a timer on her phone to remind her to go.  Thyroid nodule   Other providers: eye doctor- Tenna Child, dentist- Cephus Richer and periodontist- Dr. Geralynn Ochs. Podiatrist- Dr. Amalia Hailey   Past medical history: ecephalitis as a child and mental impairment (autistic spectrum). Occasional bizarre behavior.  Surgeries: cataract surgery   Has a doctorate in mathematics.    Reviewed allergies, medications, past medical, surgical and social history.   Review of Systems Pertinent positives and negatives in the history of present illness.     Objective:   Physical Exam BP 120/70   Pulse 63   Wt 141 lb 9.6 oz (64.2 kg)   Alert and in no acute distress. Not otherwise examined.       Assessment & Plan:  Controlled type 2 diabetes mellitus without complication, unspecified long term insulin use status ([place])  Hypertension, unspecified type  Hyperlipidemia, unspecified hyperlipidemia type  Mental impairment  Encounter to establish care  Urinary incontinence without sensory awareness  Incontinence of feces, unspecified  fecal incontinence type  Plan to get medical records from previous PCP. Her POA will call and check in to see if we have received records periodically and schedule a visit as appropriate.  She will continue on current medication regimen for now.

## 2016-01-05 DIAGNOSIS — H1089 Other conjunctivitis: Secondary | ICD-10-CM | POA: Diagnosis not present

## 2016-01-08 ENCOUNTER — Telehealth: Payer: Self-pay | Admitting: Family Medicine

## 2016-01-08 NOTE — Telephone Encounter (Signed)
Please call [name] and see why assisted living thinks the patient needs to have a home health nurse. I am ok with this if [name] thinks this is necessary.

## 2016-01-08 NOTE — Telephone Encounter (Signed)
Spoke to [place] and she said to disregard call as she is not sure why assisted living referred pt to home health and they said they didn't.

## 2016-01-08 NOTE — Telephone Encounter (Signed)
Lisa Villegas called and states that the assisted living is requesting that Lisa Villegas has a home health nurse come in she wants to have your option on it, states she would like to talk to you, she also states that she has requested the records from them to be sent over, Lisa can be reached at [phone]

## 2016-01-12 ENCOUNTER — Telehealth: Payer: Self-pay

## 2016-01-12 NOTE — Telephone Encounter (Signed)
Records placed in your folder for review. /RLB  

## 2016-01-26 ENCOUNTER — Other Ambulatory Visit: Payer: Self-pay | Admitting: Family Medicine

## 2016-01-26 DIAGNOSIS — Z1231 Encounter for screening mammogram for malignant neoplasm of breast: Secondary | ICD-10-CM

## 2016-02-02 ENCOUNTER — Encounter: Payer: Self-pay | Admitting: Podiatry

## 2016-02-02 ENCOUNTER — Ambulatory Visit (INDEPENDENT_AMBULATORY_CARE_PROVIDER_SITE_OTHER): Payer: Medicare Other | Admitting: Podiatry

## 2016-02-02 DIAGNOSIS — M79676 Pain in unspecified toe(s): Secondary | ICD-10-CM

## 2016-02-02 DIAGNOSIS — B351 Tinea unguium: Secondary | ICD-10-CM | POA: Diagnosis not present

## 2016-02-02 NOTE — Progress Notes (Signed)
Patient ID: Lisa Villegas, female   DOB: 07/08/1930, 82 y.o.   MRN: [id number]    Subjective: This patient presents with caregiver in the treatment room complaining of elongated toenails which are uncomfortable walking wearing shoes and request toenail debridement.Patient's caregiver was present in the treatment room and also power of attorney is requesting replacement diabetic custom molded shoes. Patient has worn custom molded shoes for many years to accommodate her hyperpronated feet. Without custom molded shoes patient has extreme difficulty in walking. Patient is a known diabetic with some suggestion of peripheral neuropathy.   Objective: Patient is responsive questioning, however at times patients caregiver needs to further explain questioning DP pulses 2/4 bilaterally PT pulses 1/4 bilaterally Capillary reflex immediate bilaterally Sensation to 10 g monofilament or intact 5/5 bilaterally Vibratory sensation reactive bilaterally Ankle reflexes reactive bilaterally Surgical scar dorsal right midfoot Hammertoe second left Maximally pronated feet which function at in range of motion upon weight-bearing Patient has slow flat-footed gait No open skin lesions bilaterally The toenails are elongated, hypertrophic, discolored and tender to direct palpation 6-10 replacement diabetic custom molded shoes  Assessment: Symptomatic onychomycoses 6-10 Diabetic withhistory ofperipheral neuropathy Gait disturbance associated with hyperpronation bilaterally  Plan: Debridement of toenails 6-10  Certification for diabetic shoes Patient requires custom molded shoes associated with hyperpronation and difficulty tolerating standard shoes Gait disturbance Hyperpronation bilaterally History of neuropathy    Plan: Debride toenails 6-10 mechanically and electrically without any bleeding  reappoint times three-month 

## 2016-02-02 NOTE — Patient Instructions (Signed)
Requesting replacement diabetic shoes today  Diabetes and Foot Care Diabetes may cause you to have problems because of poor blood supply (circulation) to your feet and legs. This may cause the skin on your feet to become thinner, break easier, and heal more slowly. Your skin may become dry, and the skin may peel and crack. You may also have nerve damage in your legs and feet causing decreased feeling in them. You may not notice minor injuries to your feet that could lead to infections or more serious problems. Taking care of your feet is one of the most important things you can do for yourself.  HOME CARE INSTRUCTIONS  Wear shoes at all times, even in the house. Do not go barefoot. Bare feet are easily injured.  Check your feet daily for blisters, cuts, and redness. If you cannot see the bottom of your feet, use a mirror or ask someone for help.  Wash your feet with warm water (do not use hot water) and mild soap. Then pat your feet and the areas between your toes until they are completely dry. Do not soak your feet as this can dry your skin.  Apply a moisturizing lotion or petroleum jelly (that does not contain alcohol and is unscented) to the skin on your feet and to dry, brittle toenails. Do not apply lotion between your toes.  Trim your toenails straight across. Do not dig under them or around the cuticle. File the edges of your nails with an emery board or nail file.  Do not cut corns or calluses or try to remove them with medicine.  Wear clean socks or stockings every day. Make sure they are not too tight. Do not wear knee-high stockings since they may decrease blood flow to your legs.  Wear shoes that fit properly and have enough cushioning. To break in new shoes, wear them for just a few hours a day. This prevents you from injuring your feet. Always look in your shoes before you put them on to be sure there are no objects inside.  Do not cross your legs. This may decrease the blood flow  to your feet.  If you find a minor scrape, cut, or break in the skin on your feet, keep it and the skin around it clean and dry. These areas may be cleansed with mild soap and water. Do not cleanse the area with peroxide, alcohol, or iodine.  When you remove an adhesive bandage, be sure not to damage the skin around it.  If you have a wound, look at it several times a day to make sure it is healing.  Do not use heating pads or hot water bottles. They may burn your skin. If you have lost feeling in your feet or legs, you may not know it is happening until it is too late.  Make sure your health care provider performs a complete foot exam at least annually or more often if you have foot problems. Report any cuts, sores, or bruises to your health care provider immediately. SEEK MEDICAL CARE IF:   You have an injury that is not healing.  You have cuts or breaks in the skin.  You have an ingrown nail.  You notice redness on your legs or feet.  You feel burning or tingling in your legs or feet.  You have pain or cramps in your legs and feet.  Your legs or feet are numb.  Your feet always feel cold. SEEK IMMEDIATE MEDICAL CARE IF:  There is increasing redness, swelling, or pain in or around a wound.  There is a red line that goes up your leg.  Pus is coming from a wound.  You develop a fever or as directed by your health care provider.  You notice a bad smell coming from an ulcer or wound.   This information is not intended to replace advice given to you by your health care provider. Make sure you discuss any questions you have with your health care provider.   Document Released: 03/11/2000 Document Revised: 11/14/2012 Document Reviewed: 08/21/2012 Elsevier Interactive Patient Education [employer].

## 2016-02-05 ENCOUNTER — Encounter (HOSPITAL_COMMUNITY): Payer: Self-pay | Admitting: *Deleted

## 2016-02-05 ENCOUNTER — Emergency Department (HOSPITAL_COMMUNITY)
Admission: EM | Admit: 2016-02-05 | Discharge: 2016-02-05 | Disposition: A | Discharge status: Home / Self Care | Payer: Medicare Other | Attending: Emergency Medicine | Admitting: Emergency Medicine

## 2016-02-05 ENCOUNTER — Emergency Department (HOSPITAL_COMMUNITY): Payer: Medicare Other

## 2016-02-05 DIAGNOSIS — W228XXA Striking against or struck by other objects, initial encounter: Secondary | ICD-10-CM | POA: Diagnosis not present

## 2016-02-05 DIAGNOSIS — Y999 Unspecified external cause status: Secondary | ICD-10-CM | POA: Diagnosis not present

## 2016-02-05 DIAGNOSIS — Y939 Activity, unspecified: Secondary | ICD-10-CM | POA: Insufficient documentation

## 2016-02-05 DIAGNOSIS — S0990XA Unspecified injury of head, initial encounter: Secondary | ICD-10-CM | POA: Diagnosis not present

## 2016-02-05 DIAGNOSIS — R404 Transient alteration of awareness: Secondary | ICD-10-CM | POA: Diagnosis not present

## 2016-02-05 DIAGNOSIS — W19XXXA Unspecified fall, initial encounter: Secondary | ICD-10-CM

## 2016-02-05 DIAGNOSIS — Z043 Encounter for examination and observation following other accident: Secondary | ICD-10-CM | POA: Diagnosis not present

## 2016-02-05 DIAGNOSIS — R55 Syncope and collapse: Secondary | ICD-10-CM | POA: Diagnosis not present

## 2016-02-05 DIAGNOSIS — Z7984 Long term (current) use of oral hypoglycemic drugs: Secondary | ICD-10-CM | POA: Diagnosis not present

## 2016-02-05 DIAGNOSIS — E119 Type 2 diabetes mellitus without complications: Secondary | ICD-10-CM | POA: Insufficient documentation

## 2016-02-05 DIAGNOSIS — I10 Essential (primary) hypertension: Secondary | ICD-10-CM | POA: Insufficient documentation

## 2016-02-05 DIAGNOSIS — Y929 Unspecified place or not applicable: Secondary | ICD-10-CM | POA: Diagnosis not present

## 2016-02-05 LAB — I-STAT CHEM 8, ED
BUN: 24 mg/dL — AB (ref 6–20)
Calcium, Ion: 1.16 mmol/L (ref 1.15–1.40)
Chloride: 103 mmol/L (ref 101–111)
Creatinine, Ser: 1 mg/dL (ref 0.44–1.00)
Glucose, Bld: 117 mg/dL — ABNORMAL HIGH (ref 65–99)
HEMATOCRIT: 37 % (ref 36.0–46.0)
Hemoglobin: 12.6 g/dL (ref 12.0–15.0)
Potassium: 4.9 mmol/L (ref 3.5–5.1)
SODIUM: 137 mmol/L (ref 135–145)
TCO2: 23 mmol/L (ref 0–100)

## 2016-02-05 NOTE — ED Triage Notes (Signed)
Pt arrives from the [facility] via [facility]. Pt was with a group from [facility] assisted [facility] when she needed to have a BM and the bathroom was occupied so pt sat on a crate that was low to the ground to wait. Upon standing from the position the pt stumbled and fell down hitting her head. Pt denies any pain at this time, denies dizziness, n/v, or visual changes or weakness. Pt does have her shoes on the wrong feet which may have aided in the fall.

## 2016-02-05 NOTE — ED Provider Notes (Signed)
[facility] DEPT Provider Note   CSN: [id number] Arrival date & time: 02/05/16  1143     History   Chief Complaint Chief Complaint  Patient presents with  . Fall    HPI Lisa Villegas is a 82 y.o. female.  82 yo F with a chief complaint of a fall. This was witnessed. Patient was sitting on a crate and then sit up and lost her balance. [place] and hit the back of her head. She denies any current symptoms. Denies other areas of injury. Denies cp, sob.    The history is provided by the patient. The history is limited by a language barrier.  Fall  This is a new problem. The current episode started less than 1 hour ago. The problem occurs constantly. The problem has not changed since onset.Pertinent negatives include no chest pain, no headaches and no shortness of breath. Nothing aggravates the symptoms. Nothing relieves the symptoms. She has tried nothing for the symptoms. The treatment provided no relief.    Past Medical History:  Diagnosis Date  . 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 Diabetes mellitus without complication ([place])   . Hodgkin disease ([place])   . Hyperlipidemia   . Incontinence     Patient Active Problem List   Diagnosis Date Noted  . Diabetes mellitus type 2, controlled, without complications ([place]) 123XX123  . Hypertension 01/01/2016  . Incontinence of feces 01/01/2016  . Urinary incontinence without sensory awareness 01/01/2016  . Hyperlipidemia 01/01/2016    Past Surgical History:  Procedure Laterality Date  . ABDOMINAL HYSTERECTOMY    . BREAST SURGERY      OB History    No data available       Home Medications    Prior to Admission medications   Medication Sig Start Date End Date Taking? Authorizing Provider  atorvastatin (LIPITOR) 20 MG tablet Take 20 mg by mouth every evening.    Historical Provider, MD  calcium carbonate (TUMS EX) 750 MG chewable tablet Chew 1 tablet by mouth daily.    Historical Provider, MD  Cetylpyridinium Chloride (CREST  PRO-HEALTH MT) Use as directed in the mouth or throat.    Historical Provider, MD  chlorhexidine (PERIDEX) 0.12 % solution Use as directed 15 mLs in the mouth or throat 2 (two) times daily.    Historical Provider, MD  Cholecalciferol (VITAMIN D3) 2000 UNITS TABS Take by mouth daily.    Historical Provider, MD  ferrous sulfate 325 (65 FE) MG tablet Take 325 mg by mouth 2 (two) times daily.    Historical Provider, MD  fesoterodine (TOVIAZ) 8 MG TB24 tablet Take 8 mg by mouth daily.    Historical Provider, MD  lisinopril (PRINIVIL,ZESTRIL) 10 MG tablet Take 10 mg by mouth daily.    Historical Provider, MD  magnesium chloride (SLOW-MAG) 64 MG TBEC SR tablet Take 1 tablet by mouth 2 (two) times daily.     Historical Provider, MD  metFORMIN (GLUCOPHAGE) 500 MG tablet Take 500-1,000 mg by mouth 2 (two) times daily. Takes 1 tablet in the morning and 2 tablets at bedtime    Historical Provider, MD  Multiple Vitamins-Minerals (CERTA-VITE PO) Take by mouth.    Historical Provider, MD  pioglitazone (ACTOS) 30 MG tablet Take 30 mg by mouth daily.    Historical Provider, MD  sodium fluoride (PREVIDENT) 1.1 % GEL dental gel Place 1 application onto teeth at bedtime.    Historical Provider, MD  vitamin C (ASCORBIC ACID) 500 MG tablet Take 500 mg by mouth  2 (two) times daily.    Historical Provider, MD    Family History History reviewed. No pertinent family history.  Social History Social History  Substance Use Topics  . Smoking status: Never Smoker  . Smokeless tobacco: Never Used  . Alcohol use No     Allergies   Penicillins   Review of Systems Review of Systems  Constitutional: Negative for chills and fever.  HENT: Negative for congestion and rhinorrhea.   Eyes: Negative for redness and visual disturbance.  Respiratory: Negative for shortness of breath and wheezing.   Cardiovascular: Negative for chest pain and palpitations.  Gastrointestinal: Negative for nausea and vomiting.  Genitourinary:  Negative for dysuria and urgency.  Musculoskeletal: Negative for arthralgias and myalgias.  Skin: Negative for pallor and wound.  Neurological: Negative for dizziness and headaches.     Physical Exam Updated Vital Signs BP 131/62   Pulse 67   Temp 97.9 F (36.6 C) (Oral)   Resp 16   SpO2 99%   Physical Exam  Constitutional: She is oriented to person, place, and time. She appears well-developed and well-nourished. No distress.  HENT:  Head: Normocephalic and atraumatic.  Eyes: EOM are normal. Pupils are equal, round, and reactive to light.  Neck: Normal range of motion. Neck supple.  Cardiovascular: Normal rate and regular rhythm.  Exam reveals no gallop and no friction rub.   No murmur heard. Pulmonary/Chest: Effort normal. She has no wheezes. She has no rales.  Abdominal: Soft. She exhibits no distension and no mass. There is no tenderness. There is no guarding.  Musculoskeletal: She exhibits no edema or tenderness.  No signs of trauma patient is able to rotate her head 45 in either direction without pain.  Neurological: She is alert and oriented to person, place, and time.  Skin: Skin is warm and dry. She is not diaphoretic.  Psychiatric: She has a normal mood and affect. Her behavior is normal.  Nursing note and vitals reviewed.    ED Treatments / Results  Labs (all labs ordered are listed, but only abnormal results are displayed) Labs Reviewed  I-STAT CHEM 8, ED - Abnormal; Notable for the following:       Result Value   BUN 24 (*)    Glucose, Bld 117 (*)    All other components within normal limits    EKG  EKG Interpretation  Date/Time:  Friday February 05 2016 11:52:53 EST Ventricular Rate:  66 PR Interval:    QRS Duration: 106 QT Interval:  417 QTC Calculation: 437 R Axis:   80 Text Interpretation:  Sinus rhythm No old tracing to compare Confirmed by Sashay Felling MD, DANIEL [phone]) on 02/05/2016 12:02:17 PM       Radiology Ct Head Wo Contrast  Result  Date: 02/05/2016 CLINICAL DATA:  Pt stumbled and fell hitting her head on concrete floor; pt denies h/a, has no obvious injury EXAM: CT HEAD WITHOUT CONTRAST TECHNIQUE: Contiguous axial images were obtained from the base of the skull through the vertex without intravenous contrast. COMPARISON:  None. FINDINGS: Brain: No evidence of acute infarction, hemorrhage, hydrocephalus, extra-axial collection or mass lesion/mass effect. There is ventricular and sulcal enlargement reflecting age-appropriate volume loss. Vascular: No hyperdense vessel or unexpected calcification. Skull: Normal. Negative for fracture or focal lesion. Sinuses/Orbits: No acute finding. Other: None. IMPRESSION: 1. No acute intracranial abnormality. 2. Normal exam for age. Electronically Signed   By: Lajean Manes M.D.   On: 02/05/2016 14:10    Procedures Procedures (including critical  care time)  Medications Ordered in ED Medications - No data to display   Initial Impression / Assessment and Plan / ED Course  I have reviewed the triage vital signs and the nursing notes.  Pertinent labs & imaging results that were available during my care of the patient were reviewed by me and considered in my medical decision making (see chart for details).  Clinical Course     82 yo F With a chief complaint of a likely fall. This might also been single event as it happened right after standing. EKG with no significant findings. Will obtain a i-STAT Chem-8 to evaluate for anemia or electrolyte abnormality. CT of the head. Patient has no neck pain is able to rotate her head 45 in either direction I do not feel imaging of the C-spine is warranted at this time.  CT head negative, labs with no significant abnormality.   2:28 PM: 82 yo F I have discussed the diagnosis/risks/treatment options with the patient and family and believe the pt to be eligible for discharge home to follow-up with PCP. We also discussed returning to the ED immediately if  new or worsening sx occur. We discussed the sx which are most concerning (e.g., sudden worsening pain, fever, inability to tolerate by mouth) that necessitate immediate return. Medications administered to the patient during their visit and any new prescriptions provided to the patient are listed below.  Medications given during this visit Medications - No data to display   The patient appears reasonably screen and/or stabilized for discharge and I doubt any other medical condition or other [facility] requiring further screening, evaluation, or treatment in the ED at this time prior to discharge.    Final Clinical Impressions(s) / ED Diagnoses   Final diagnoses:  Fall, initial encounter    New Prescriptions New Prescriptions   No medications on file     Deno Etienne, DO 02/05/16 1428

## 2016-02-05 NOTE — ED Notes (Signed)
Pt is in stable condition upon d/c and is escorted from ED via wheelchair. 

## 2016-02-08 ENCOUNTER — Encounter: Payer: Self-pay | Admitting: Family Medicine

## 2016-02-08 ENCOUNTER — Ambulatory Visit (INDEPENDENT_AMBULATORY_CARE_PROVIDER_SITE_OTHER): Payer: Medicare Other | Admitting: Family Medicine

## 2016-02-08 VITALS — BP 110/60 | HR 78 | Wt 135.0 lb

## 2016-02-08 DIAGNOSIS — E119 Type 2 diabetes mellitus without complications: Secondary | ICD-10-CM

## 2016-02-08 DIAGNOSIS — I10 Essential (primary) hypertension: Secondary | ICD-10-CM

## 2016-02-08 DIAGNOSIS — Z09 Encounter for follow-up examination after completed treatment for conditions other than malignant neoplasm: Secondary | ICD-10-CM | POA: Insufficient documentation

## 2016-02-08 NOTE — Progress Notes (Signed)
Subjective:    Patient ID: Lisa Villegas, female    DOB: Feb 27, 1931, 82 y.o.   MRN: [id number]  HPI Chief Complaint  Patient presents with  . fell over the weekend    follow-up on fall   She is here for a ED follow up for a fall that occurred on 02/05/2016. The fall was witnessed. Unknown if patient had LOC, patient denies this. Her caregiver [name] is here to provide information since the patient is not a very capable informant. States patient fell after going from sitting to standing. This occurred while the patient was on a field trip from the [place]. ED note states she lost her balance and fell onto the ground and hit the back of her head.  She was evaluated in the ED and discharged back to her LTCF. Labs were obtained and an EKG was done.  CT head was negative. No C-spine was performed and was not indicated per the chart.   She has a history of poor balance per chart from [facility].   Caregiver with her today state patient has a decreased appetite and is pale. This has been ongoing for several months Has been at [facility] for 4 years. States food is not good.   Caregiver states patient's appetite is poor in general and has not been good for several weeks. She thinks this is due to poor food quality at the LTCF. She also states the patient is not drinking enough fluids.   Blood sugars at the facility are recorded and readings have been 98-108 fasting and Mondays, Wednesdays and Fridays and at bedtime 86-124  [name] her caregiver has been giving her food that she likes such as Eula [employer] and she will eat this.  Caregiver states patient is sensory deprived which makes getting her to eat, drink and report pain difficult.  Denies fever, chills, chest pain, palpitations, shortness of breath, abdominal pain, N/V/D.   Past Medical History:  Diagnosis Date  . 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 Diabetes mellitus without complication ([place])   . Hodgkin disease ([place])    . Hyperlipidemia   . Incontinence       Review of Systems Pertinent positives and negatives in the history of present illness.     Objective:   Physical Exam  Constitutional: She appears well-developed and well-nourished. She does not have a sickly appearance. No distress.  HENT:  Head: Normocephalic and atraumatic.  Mouth/Throat: Oropharynx is clear and moist.  Neck: Normal range of motion. Neck supple.  Cardiovascular: Normal rate, regular rhythm, normal heart sounds and intact distal pulses.   Pulmonary/Chest: Effort normal and breath sounds normal.  Neurological: She is alert. Coordination normal.  Skin: Skin is warm and dry.   BP 110/60   Pulse 78   Wt 135 lb (61.2 kg)   SpO2 96%      Assessment & Plan:  Follow-up examination for injury  Controlled type 2 diabetes mellitus without complication, without long-term current use of insulin ([place])  Essential hypertension  Reviewed chart from ED visit and patient did not appear to have any obvious injury from the fall. Labs did not show anemia or hypoglycemia. Negative head CT and ECG.  No obvious explanation for patient's recent fall and questionable syncopal episode. She does have a history of balance issues per medical records.  Caregiver states patient appears to be back to baseline today but she is concerned that the patient is not staying well hydrated or eating enough on  a daily basis.  Patient verbalized understanding and agrees to try and be more vigilant about eating and drinking fluids.  BP is within goal range. Blood sugar readings from assisted living are normal.  Plan to have her return in 2 weeks to see how she is doing with staying hydrated and will check her weight and nutrition status at that time.

## 2016-02-22 ENCOUNTER — Telehealth: Payer: Self-pay

## 2016-02-22 ENCOUNTER — Ambulatory Visit: Payer: Medicare Other | Admitting: Family Medicine

## 2016-02-22 NOTE — Telephone Encounter (Signed)
Schedule in the next 2 weeks for follow up weight and nutrition.

## 2016-02-22 NOTE — Telephone Encounter (Signed)

## 2016-02-24 ENCOUNTER — Ambulatory Visit: Payer: Medicare Other

## 2016-02-26 ENCOUNTER — Encounter: Payer: Self-pay | Admitting: Family Medicine

## 2016-02-26 ENCOUNTER — Ambulatory Visit
Admission: RE | Admit: 2016-02-26 | Discharge: 2016-02-26 | Disposition: A | Discharge status: Home / Self Care | Payer: Medicare Other | Source: Ambulatory Visit | Attending: Family Medicine | Admitting: Family Medicine

## 2016-02-26 DIAGNOSIS — Z1231 Encounter for screening mammogram for malignant neoplasm of breast: Secondary | ICD-10-CM

## 2016-02-26 NOTE — Telephone Encounter (Signed)
No show letter with request to call the office for an appt sent

## 2016-03-02 ENCOUNTER — Other Ambulatory Visit: Payer: Self-pay | Admitting: Family Medicine

## 2016-03-02 DIAGNOSIS — R928 Other abnormal and inconclusive findings on diagnostic imaging of breast: Secondary | ICD-10-CM

## 2016-03-08 ENCOUNTER — Other Ambulatory Visit: Payer: Medicare Other

## 2016-03-14 ENCOUNTER — Ambulatory Visit (INDEPENDENT_AMBULATORY_CARE_PROVIDER_SITE_OTHER): Payer: Medicare Other | Admitting: Family Medicine

## 2016-03-14 ENCOUNTER — Other Ambulatory Visit: Payer: Self-pay | Admitting: Family Medicine

## 2016-03-14 ENCOUNTER — Encounter: Payer: Self-pay | Admitting: Family Medicine

## 2016-03-14 ENCOUNTER — Telehealth: Payer: Self-pay | Admitting: Internal Medicine

## 2016-03-14 VITALS — BP 98/55 | HR 88 | Temp 98.2°F | Resp 16 | Wt 127.8 lb

## 2016-03-14 DIAGNOSIS — R296 Repeated falls: Secondary | ICD-10-CM | POA: Diagnosis not present

## 2016-03-14 DIAGNOSIS — R159 Full incontinence of feces: Secondary | ICD-10-CM

## 2016-03-14 DIAGNOSIS — N3000 Acute cystitis without hematuria: Secondary | ICD-10-CM

## 2016-03-14 DIAGNOSIS — Z79899 Other long term (current) drug therapy: Secondary | ICD-10-CM | POA: Diagnosis not present

## 2016-03-14 DIAGNOSIS — R531 Weakness: Secondary | ICD-10-CM

## 2016-03-14 DIAGNOSIS — R32 Unspecified urinary incontinence: Secondary | ICD-10-CM | POA: Diagnosis not present

## 2016-03-14 DIAGNOSIS — R634 Abnormal weight loss: Secondary | ICD-10-CM | POA: Diagnosis not present

## 2016-03-14 LAB — CBC WITH DIFFERENTIAL/PLATELET
BASOS PCT: 0 %
Basophils Absolute: 0 cells/uL (ref 0–200)
EOS PCT: 1 %
Eosinophils Absolute: 88 cells/uL (ref 15–500)
HEMATOCRIT: 40.3 % (ref 35.0–45.0)
HEMOGLOBIN: 12.5 g/dL (ref 11.7–15.5)
LYMPHS ABS: 1496 {cells}/uL (ref 850–3900)
Lymphocytes Relative: 17 %
MCH: 28.1 pg (ref 27.0–33.0)
MCHC: 31 g/dL — ABNORMAL LOW (ref 32.0–36.0)
MCV: 90.6 fL (ref 80.0–100.0)
MONO ABS: 528 {cells}/uL (ref 200–950)
MPV: 9.2 fL (ref 7.5–12.5)
Monocytes Relative: 6 %
NEUTROS ABS: 6688 {cells}/uL (ref 1500–7800)
NEUTROS PCT: 76 %
Platelets: 337 10*3/uL (ref 140–400)
RBC: 4.45 MIL/uL (ref 3.80–5.10)
RDW: 15.1 % — ABNORMAL HIGH (ref 11.0–15.0)
WBC: 8.8 10*3/uL (ref 4.0–10.5)

## 2016-03-14 LAB — COMPREHENSIVE METABOLIC PANEL
ALBUMIN: 4 g/dL (ref 3.6–5.1)
ALK PHOS: 60 U/L (ref 33–130)
ALT: 13 U/L (ref 6–29)
AST: 34 U/L (ref 10–35)
BILIRUBIN TOTAL: 0.3 mg/dL (ref 0.2–1.2)
BUN: 34 mg/dL — ABNORMAL HIGH (ref 7–25)
CALCIUM: 10 mg/dL (ref 8.6–10.4)
CO2: 21 mmol/L (ref 20–31)
CREATININE: 0.96 mg/dL — AB (ref 0.60–0.88)
Chloride: 100 mmol/L (ref 98–110)
Glucose, Bld: 141 mg/dL — ABNORMAL HIGH (ref 65–99)
Potassium: 5.9 mmol/L — ABNORMAL HIGH (ref 3.5–5.3)
SODIUM: 135 mmol/L (ref 135–146)
Total Protein: 6.9 g/dL (ref 6.1–8.1)

## 2016-03-14 LAB — POCT URINALYSIS DIPSTICK
BILIRUBIN UA: NEGATIVE
GLUCOSE UA: NEGATIVE
NITRITE UA: NEGATIVE
RBC UA: NEGATIVE
Spec Grav, UA: >=1.03
Urobilinogen, UA: NEGATIVE
pH, UA: 6

## 2016-03-14 LAB — GLUCOSE, POCT (MANUAL RESULT ENTRY): POC GLUCOSE: 129 mg/dL — AB (ref 70–99)

## 2016-03-14 MED ORDER — SULFAMETHOXAZOLE-TRIMETHOPRIM 800-160 MG PO TABS: 1.0000 | ORAL_TABLET | Freq: Two times a day (BID) | ORAL | 0 refills | Status: DC

## 2016-03-14 NOTE — Patient Instructions (Signed)
We are treating for a UTI. Please complete the antibiotic as prescribed.   Increase daily water intake to 6-8  (8 ounce ) glasses per day.   2 Boost or Ensure daily as prescribed.   PT/OT eval and treat as prescribed.   Once weekly weights and report to me weight loss of 2 or more lbs.

## 2016-03-14 NOTE — Telephone Encounter (Signed)
Clarise Cruz Child psychotherapist with [facility] with [facility] called wants ok verbal for PT/OT and Skilled nursing Eval. She asked for office notes from today's visit but at this time it was not available, she will call back later on to see if it has been completed to get a copy.   Tresa Moore- [phone]

## 2016-03-14 NOTE — Progress Notes (Signed)
Subjective:    Patient ID: Lisa Villegas, female    DOB: [birth year], 82 y.o.   MRN: [id number]  HPI Chief Complaint  Patient presents with  . multiple issues    multiple issues.- pale, Bs, weak, not eating, dehyrated   She was brought here by her caregiver for multiple falls and questionable syncopal episodes over the past couple of months. Caregiver states patient is not eating or drinking as usual and has concerns that the patient is not being well cared for in the LTCF she is in. Reports she has had some generalized weakness and decreased appetite and fluid intake over the past several weeks but cannot give me a specific time frame. States she is concerned that the patient's health is gradually declining.  States the patient is in the autism spectrum and has difficulty verbalizing her thoughts or feelings and also has decreased pain receptors.  States she does not require assistance with ambulation. Reports patient being more withdrawn and is not participating in normal activities, however she does still go to her favorite Bingo class. States patient is less responsive than usual.  States patient is in bed more often and less communicative.   Caregiver states patient is eating 3 meals per day but not as much as she normally does even when it is food that she knows she likes.   She does have an 8 lb weight loss over the past month.   caregiver states patient has been incontinent of urine and stool for at least 7 years. She does wear adult diapers.   Denies fever, chills, dizziness, vision changes, chest pain, palpitations, shortness of breath, cough, abdominal pain, vomiting or diarrhea.   Past Medical History:  Diagnosis Date  . 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 Diabetes mellitus without complication ([place])   . Hodgkin disease ([place])   . Hyperlipidemia   . Incontinence     Past Surgical History:  Procedure Laterality Date  . ABDOMINAL HYSTERECTOMY    . BREAST SURGERY        Review of Systems Pertinent positives and negatives in the history of present illness.     Objective:   Physical Exam  Constitutional: She is oriented to person, place, and time. Vital signs are normal. She appears well-developed. She is cooperative. No distress.  Hygiene is adequate, hair is not well groomed. Dried food around mouth  HENT:  Right Ear: Hearing, tympanic membrane and ear canal normal.  Left Ear: Hearing, tympanic membrane and ear canal normal.  Nose: Nose normal. Right sinus exhibits no maxillary sinus tenderness and no frontal sinus tenderness. Left sinus exhibits no maxillary sinus tenderness and no frontal sinus tenderness.  Mouth/Throat: Uvula is midline, oropharynx is clear and moist and mucous membranes are normal.  Eyes: Conjunctivae and EOM are normal. Pupils are equal, round, and reactive to light.  Neck: Normal range of motion and full passive range of motion without pain. Neck supple. No JVD present.  Cardiovascular: Normal rate, regular rhythm, normal heart sounds and intact distal pulses.  Exam reveals no gallop and no friction rub.   No murmur heard. Pulmonary/Chest: Effort normal and breath sounds normal. No accessory muscle usage.  Abdominal: Soft. Normal appearance and bowel sounds are normal. There is no hepatosplenomegaly. There is no tenderness. There is no rigidity, no rebound, no guarding, no CVA tenderness, no tenderness at McBurney's point and negative Murphy's sign.  Lymphadenopathy:    She has no cervical adenopathy.  Neurological: She is alert and oriented  to person, place, and time. She has normal strength and normal reflexes. No cranial nerve deficit. Coordination and gait normal.  Skin: Skin is warm and dry. No bruising and no rash noted. She is not diaphoretic. No cyanosis. There is pallor.  Slightly pale No tenting.    Psychiatric: She has a normal mood and affect. Her speech is normal.  Baseline mental status. Answers questions and  follows commands appropriately. Does not initiate conversation.    BP (!) 98/55 (BP Location: Right Arm, Patient Position: Standing, Cuff Size: Normal)   Pulse 88   Temp 98.2 F (36.8 C) (Oral)   Resp 16   Wt 127 lb 12.8 oz (58 kg)   SpO2 99%   Is not orthostatic  POCT glucose 129 (post snack)      Assessment & Plan:  Generalized weakness - Plan: Urinalysis Dipstick, POCT glucose (manual entry), CBC with Differential/Platelet, Comprehensive metabolic panel, TSH  Multiple falls  Urinary incontinence, unspecified type - Plan: Urinalysis Dipstick, Urine culture, sulfamethoxazole-trimethoprim (BACTRIM DS,SEPTRA DS) 800-160 MG tablet  Incontinence of feces, unspecified fecal incontinence type  Loss of weight  Acute cystitis without hematuria - Plan: sulfamethoxazole-trimethoprim (BACTRIM DS,SEPTRA DS) 800-160 MG tablet  Plan to treat for UTI. Increase fluids. Send urine for culture.  Weight loss-labs ordered.  Add boost or Ensure to diet twice daily. Order written for once weekly weights to be done at her LTCF.  She does not appear infectious or toxic. She is not orthostatic. Baseline mental status.  Plan to order PT/OT evaluation as well as a skilled nursing evaluation for generalized weakness and falls.  Her caregiver will find out last colonoscopy as this is not in her EMR. [place].  Follow up pending labs.

## 2016-03-15 ENCOUNTER — Other Ambulatory Visit: Payer: Medicare Other

## 2016-03-15 ENCOUNTER — Other Ambulatory Visit: Payer: Self-pay | Admitting: Family Medicine

## 2016-03-15 ENCOUNTER — Telehealth: Payer: Self-pay | Admitting: Internal Medicine

## 2016-03-15 ENCOUNTER — Telehealth: Payer: Self-pay | Admitting: Family Medicine

## 2016-03-15 DIAGNOSIS — R531 Weakness: Secondary | ICD-10-CM

## 2016-03-15 DIAGNOSIS — N390 Urinary tract infection, site not specified: Secondary | ICD-10-CM | POA: Diagnosis not present

## 2016-03-15 DIAGNOSIS — R55 Syncope and collapse: Secondary | ICD-10-CM | POA: Diagnosis not present

## 2016-03-15 LAB — TSH: TSH: 0.94 mIU/L

## 2016-03-15 LAB — URINE CULTURE: Organism ID, Bacteria: NO GROWTH

## 2016-03-15 NOTE — Telephone Encounter (Signed)
Thank you for taking care of this.

## 2016-03-15 NOTE — Telephone Encounter (Signed)
Hinton Dyer nurse with [facility] wanted to know if it was ok to give glucerna since she was diabetic and then wanted to know if pt can bp and weights daily. Her bp was 90/50 today and that was before giving bp meds. She thinks that she is getting weak and having the dizziness because of her bp. Even if her bp are running low, she states they are still giving bp med lisinopril. Hinton Dyer was notified that her bp was around that yesterday in the office.   She needs an order sent to [facility] to have daily bp check (prior to bp med to be given) and then daily weights to actually keep a record of whats going on. Pt was advised that Dr. Redmond School verbal oked to change to glucerna and daily bp checks.

## 2016-03-15 NOTE — Telephone Encounter (Signed)
[name] caregiver for pt called and states [facility] [address] [employer] manager is stating she needs an order from [place] regarding Boost.  So I called Stanton Kidney at [facility] and she said she was ok with the Boost order that they got that yesterday and it would be ordered through the community and [name] was bringing some in. Stanton Kidney said there was confusion on the antibiotic.  So rewrote the rx order and gave to [facility] to take in Bactrim 800-160 mg tab 1 by mouth 2 times daily  #14.

## 2016-03-16 ENCOUNTER — Encounter: Payer: Self-pay | Admitting: Family Medicine

## 2016-03-16 DIAGNOSIS — R55 Syncope and collapse: Secondary | ICD-10-CM | POA: Diagnosis not present

## 2016-03-16 DIAGNOSIS — N390 Urinary tract infection, site not specified: Secondary | ICD-10-CM | POA: Diagnosis not present

## 2016-03-16 LAB — COMPREHENSIVE METABOLIC PANEL
ALBUMIN: 4.1 g/dL (ref 3.6–5.1)
ALK PHOS: 61 U/L (ref 33–130)
ALT: 12 U/L (ref 6–29)
AST: 15 U/L (ref 10–35)
BILIRUBIN TOTAL: 0.2 mg/dL (ref 0.2–1.2)
BUN: 24 mg/dL (ref 7–25)
CO2: 24 mmol/L (ref 20–31)
CREATININE: 0.93 mg/dL — AB (ref 0.60–0.88)
Calcium: 9.8 mg/dL (ref 8.6–10.4)
Chloride: 98 mmol/L (ref 98–110)
Glucose, Bld: 121 mg/dL — ABNORMAL HIGH (ref 65–99)
Potassium: 4.9 mmol/L (ref 3.5–5.3)
SODIUM: 135 mmol/L (ref 135–146)
TOTAL PROTEIN: 6.8 g/dL (ref 6.1–8.1)

## 2016-03-16 MED ORDER — FESOTERODINE FUMARATE ER 8 MG PO TB24: 8.0000 mg | ORAL_TABLET | Freq: Every day | ORAL | 2 refills | Status: DC

## 2016-03-16 NOTE — Addendum Note (Signed)
Addended by: Minette Headland A on: 03/16/2016 03:12 PM   Modules accepted: Orders

## 2016-03-16 NOTE — Telephone Encounter (Signed)
I am writing orders to stop Lisinopril, daily BP and weight checks. Glucerna twice daily.  Please find out how often they are checking her blood sugar and if they are not checking it daily please add this to the order list, daily fasting blood sugars and if her fasting BS is <130 hold Actos.

## 2016-03-17 ENCOUNTER — Encounter: Payer: Self-pay | Admitting: Family Medicine

## 2016-03-17 DIAGNOSIS — F329 Major depressive disorder, single episode, unspecified: Secondary | ICD-10-CM | POA: Insufficient documentation

## 2016-03-17 DIAGNOSIS — N183 Chronic kidney disease, stage 3 unspecified: Secondary | ICD-10-CM | POA: Insufficient documentation

## 2016-03-17 DIAGNOSIS — E559 Vitamin D deficiency, unspecified: Secondary | ICD-10-CM | POA: Insufficient documentation

## 2016-03-17 DIAGNOSIS — R2689 Other abnormalities of gait and mobility: Secondary | ICD-10-CM | POA: Insufficient documentation

## 2016-03-17 DIAGNOSIS — E119 Type 2 diabetes mellitus without complications: Secondary | ICD-10-CM | POA: Insufficient documentation

## 2016-03-17 DIAGNOSIS — Z8639 Personal history of other endocrine, nutritional and metabolic disease: Secondary | ICD-10-CM | POA: Insufficient documentation

## 2016-03-17 DIAGNOSIS — F840 Autistic disorder: Secondary | ICD-10-CM | POA: Insufficient documentation

## 2016-03-17 DIAGNOSIS — M8580 Other specified disorders of bone density and structure, unspecified site: Secondary | ICD-10-CM | POA: Insufficient documentation

## 2016-03-17 DIAGNOSIS — R200 Anesthesia of skin: Secondary | ICD-10-CM | POA: Insufficient documentation

## 2016-03-17 DIAGNOSIS — F32A Depression, unspecified: Secondary | ICD-10-CM | POA: Insufficient documentation

## 2016-03-17 DIAGNOSIS — R32 Unspecified urinary incontinence: Secondary | ICD-10-CM | POA: Insufficient documentation

## 2016-03-17 DIAGNOSIS — F419 Anxiety disorder, unspecified: Secondary | ICD-10-CM | POA: Insufficient documentation

## 2016-03-17 DIAGNOSIS — I10 Essential (primary) hypertension: Secondary | ICD-10-CM | POA: Insufficient documentation

## 2016-03-17 DIAGNOSIS — C8190 Hodgkin lymphoma, unspecified, unspecified site: Secondary | ICD-10-CM | POA: Insufficient documentation

## 2016-03-17 LAB — HEMOGLOBIN A1C
Hgb A1c MFr Bld: 6.2 % — ABNORMAL HIGH (ref <5.7)
Mean Plasma Glucose: 131 mg/dL

## 2016-03-17 NOTE — Telephone Encounter (Signed)
I have sent an order over for the following- [phone]) [phone]  1- glucerna twice a day 2- bp,weight daily in the am 3- stop lisinopril 4- stop antibiotic bactrim DS 5- check dialy BS fasting 6- stop the actos med

## 2016-03-18 ENCOUNTER — Telehealth: Payer: Self-pay | Admitting: Family Medicine

## 2016-03-18 MED ORDER — ATORVASTATIN CALCIUM 20 MG PO TABS: 20.0000 mg | ORAL_TABLET | Freq: Every evening | ORAL | 2 refills | Status: DC

## 2016-03-18 NOTE — Telephone Encounter (Signed)
Glucerna was already called out. I'd defer the appetite stimulant to Vickie since she is PCP and I'm not real familiar with her case.

## 2016-03-18 NOTE — Telephone Encounter (Signed)
Pt's caregiver, [name], requesting script for Glucerna to be giver 2 times a day and an appetite stimulant to be faxed to [facility]. [name] said Vickie gave pt a script for Boost this week however due to pt not eating and losing weight, she needs the additional script before the holiday.   [name] would like to be called when this has been faxed

## 2016-03-18 NOTE — Telephone Encounter (Signed)
Printed and faxed over to [facility]

## 2016-03-18 NOTE — Telephone Encounter (Signed)
Needs Rx lipitor sent to   Needs Rx faxed to them and they will forward to facility [phone]

## 2016-03-22 DIAGNOSIS — N390 Urinary tract infection, site not specified: Secondary | ICD-10-CM | POA: Diagnosis not present

## 2016-03-22 DIAGNOSIS — R55 Syncope and collapse: Secondary | ICD-10-CM | POA: Diagnosis not present

## 2016-03-22 NOTE — Telephone Encounter (Signed)
[facility] got orders and are faxing over bp, weights and bs

## 2016-03-22 NOTE — Telephone Encounter (Signed)
Let Lisa Villegas know that an appetite stimulant is an option that we can consider at some point but they have side effects so I recommend that we give Lisa Villegas a couple of weeks on the Glucerna and see if her weight is stable by putting in extra calories. Let's get the patient's weights from [facility] and all of her vitals that I have requested for them to monitor and see how things look. Dr. Redmond School also is in agreement with this plan.

## 2016-03-22 NOTE — Telephone Encounter (Signed)
Called and left a detailed message on linda's vm and also called [facility] to see if they would send over bp, weights and sugars. They are not certain that they got the order so I will resent it over just incase, as it had multiple orders on it.   Fax # [phone] and [phone]

## 2016-03-24 DIAGNOSIS — R55 Syncope and collapse: Secondary | ICD-10-CM | POA: Diagnosis not present

## 2016-03-24 DIAGNOSIS — N390 Urinary tract infection, site not specified: Secondary | ICD-10-CM | POA: Diagnosis not present

## 2016-03-25 DIAGNOSIS — N390 Urinary tract infection, site not specified: Secondary | ICD-10-CM | POA: Diagnosis not present

## 2016-03-25 DIAGNOSIS — R55 Syncope and collapse: Secondary | ICD-10-CM | POA: Diagnosis not present

## 2016-03-29 DIAGNOSIS — N390 Urinary tract infection, site not specified: Secondary | ICD-10-CM | POA: Diagnosis not present

## 2016-03-29 DIAGNOSIS — R55 Syncope and collapse: Secondary | ICD-10-CM | POA: Diagnosis not present

## 2016-03-31 DIAGNOSIS — N390 Urinary tract infection, site not specified: Secondary | ICD-10-CM | POA: Diagnosis not present

## 2016-03-31 DIAGNOSIS — R55 Syncope and collapse: Secondary | ICD-10-CM | POA: Diagnosis not present

## 2016-04-04 DIAGNOSIS — N390 Urinary tract infection, site not specified: Secondary | ICD-10-CM | POA: Diagnosis not present

## 2016-04-04 DIAGNOSIS — R55 Syncope and collapse: Secondary | ICD-10-CM | POA: Diagnosis not present

## 2016-04-05 DIAGNOSIS — N390 Urinary tract infection, site not specified: Secondary | ICD-10-CM | POA: Diagnosis not present

## 2016-04-05 DIAGNOSIS — R55 Syncope and collapse: Secondary | ICD-10-CM | POA: Diagnosis not present

## 2016-04-06 ENCOUNTER — Ambulatory Visit (INDEPENDENT_AMBULATORY_CARE_PROVIDER_SITE_OTHER): Payer: Medicare Other | Admitting: Family Medicine

## 2016-04-06 ENCOUNTER — Encounter: Payer: Self-pay | Admitting: Family Medicine

## 2016-04-06 ENCOUNTER — Ambulatory Visit
Admission: RE | Admit: 2016-04-06 | Discharge: 2016-04-06 | Disposition: A | Discharge status: Home / Self Care | Payer: Medicare Other | Source: Ambulatory Visit | Attending: Family Medicine | Admitting: Family Medicine

## 2016-04-06 VITALS — BP 124/80 | HR 78 | Temp 98.5°F | Wt 131.0 lb

## 2016-04-06 DIAGNOSIS — R928 Other abnormal and inconclusive findings on diagnostic imaging of breast: Secondary | ICD-10-CM | POA: Diagnosis not present

## 2016-04-06 DIAGNOSIS — F329 Major depressive disorder, single episode, unspecified: Secondary | ICD-10-CM | POA: Diagnosis not present

## 2016-04-06 DIAGNOSIS — F32A Depression, unspecified: Secondary | ICD-10-CM

## 2016-04-06 DIAGNOSIS — N6489 Other specified disorders of breast: Secondary | ICD-10-CM | POA: Diagnosis not present

## 2016-04-06 NOTE — Progress Notes (Signed)
Subjective:    Patient ID: Lisa Villegas, female    DOB: [birth year], 83 y.o.   MRN: [id number]  HPI Chief Complaint  Patient presents with  . depression    possible depression, sleeps all the time, no interest in taking care of herself,  red eye- swollen yesterday   She is here with her caregiver, [name], for concerns about the patient feeling depressed. She is not participating in activities at the LTCF like normal.   Her caregiver states patient is not eating well or taking care of herself. Has been refusing PT, OT, recreation activities more often. Patient has had an increase in weight and drinking Glucerna shakes as ordered from her previous visit.   After further discussion, her caregiver admits that the patients current behavior is not significantly different than her usual behavior over the past several years.   Patient has a history of depression and caregiver states she has been tried on different antidepressants or psychotropic medications and did not respond well to any of these. Does not want her to be put on a new medication for this today. States she thinks patient recent behavior is most likely related to the fact that her caregiver has been out of town. States she has seemed more interactive over the past 2 days.   The patient is not a good historian but is alert and answers simple questions. She denies feeling depressed or having pain.   No recent fever, chills, headache, dizziness, chest pain, congestion, sore throat, cough, vomiting, diarrhea.     Past Medical History:  Diagnosis Date  . Anxiety and depression    per med rec  . 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 Autism   . Chronic CHF (congestive heart failure) ([facility])    per med rec  . Chronic kidney disease (CKD)    stage 3 per chart in 09/2015  . Diabetes mellitus without complication ([place])   . History of thyroid nodule    nontoxic goiter per med rec  . Hodgkin disease ([place])    in 72  . HTN  (hypertension), benign   . Hyperlipidemia   . Incontinence   . Lack of sensation   . Osteopenia    per med rec  . Poor balance   . Vitamin D deficiency    Past Surgical History:  Procedure Laterality Date  . ABDOMINAL HYSTERECTOMY    . BREAST SURGERY      Reviewed allergies, medications, past medical, surgical, and social history.   Review of Systems Pertinent positives and negatives in the history of present illness.     Objective:   Physical Exam  Constitutional: She appears well-developed and well-nourished. No distress.  Eyes: Conjunctivae and EOM are normal. Pupils are equal, round, and reactive to light.  Neck: Normal range of motion. Neck supple.  Cardiovascular: Normal rate, regular rhythm, normal heart sounds and intact distal pulses.   Pulmonary/Chest: Effort normal and breath sounds normal.  Musculoskeletal: Normal range of motion.  Neurological: She is alert. No cranial nerve deficit. Coordination normal.  Skin: Skin is warm and dry. No pallor.   BP 124/80   Pulse 78   Temp 98.5 F (36.9 C) (Oral)   Wt 131 lb (59.4 kg)       Assessment & Plan:  Depression, unspecified depression type  Discussed that she appears unchanged and her exam is unremarkable. Caregiver and I agree that we will not start any new medication at this time. Discussed that she appears  to be engaged today and her recent behavior is likely related to missing her caregiver.  I ambulated with the patient around the office and she did not have any difficulty with balance, weakness, shortness of breath and her vital signs post walk were normal. HR in the 80s and pulse ox of 98%.  She will continue with PT, OT and skilled nursing from [facility] will be coming to assist with any concerns.  Plan to have her continue on current medications and follow up as needed.

## 2016-04-11 DIAGNOSIS — N390 Urinary tract infection, site not specified: Secondary | ICD-10-CM | POA: Diagnosis not present

## 2016-04-11 DIAGNOSIS — R55 Syncope and collapse: Secondary | ICD-10-CM | POA: Diagnosis not present

## 2016-04-12 ENCOUNTER — Ambulatory Visit: Payer: Medicare Other | Admitting: Family Medicine

## 2016-04-12 ENCOUNTER — Encounter: Payer: Self-pay | Admitting: Family Medicine

## 2016-04-12 DIAGNOSIS — N390 Urinary tract infection, site not specified: Secondary | ICD-10-CM | POA: Diagnosis not present

## 2016-04-12 DIAGNOSIS — R55 Syncope and collapse: Secondary | ICD-10-CM | POA: Diagnosis not present

## 2016-04-19 ENCOUNTER — Telehealth: Payer: Self-pay

## 2016-04-19 NOTE — Telephone Encounter (Signed)
Talk to me when you come in

## 2016-04-19 NOTE — Telephone Encounter (Signed)
Lisa Villegas pt- Lisa Villegas called to let us know pt is still not eating. Please advise. Victorino December

## 2016-04-20 NOTE — Telephone Encounter (Signed)
Called [facility] [phone] and the med tech is in the middle of rotation and she will fax [place] over documentation later on her bp, bs and weights

## 2016-04-20 NOTE — Telephone Encounter (Signed)
I need weight documentation from the long term care facility to see what her weight has been doing please. Also, they are supposed to be keeping up with blood pressure and blood sugars. Please ask for all of these.

## 2016-04-22 ENCOUNTER — Encounter: Payer: Self-pay | Admitting: Family Medicine

## 2016-04-22 ENCOUNTER — Ambulatory Visit (INDEPENDENT_AMBULATORY_CARE_PROVIDER_SITE_OTHER): Payer: Medicare Other | Admitting: Family Medicine

## 2016-04-22 VITALS — BP 120/80 | HR 58 | Temp 98.5°F | Wt 129.8 lb

## 2016-04-22 DIAGNOSIS — H1033 Unspecified acute conjunctivitis, bilateral: Secondary | ICD-10-CM | POA: Diagnosis not present

## 2016-04-22 MED ORDER — AZITHROMYCIN 250 MG PO TABS: ORAL_TABLET | ORAL | 0 refills | Status: DC

## 2016-04-22 NOTE — Progress Notes (Signed)
   Subjective:    Patient ID: Lisa Villegas, female    DOB: 20-Jun-1930, 83 y.o.   MRN: [id number]  HPI Chief Complaint  Patient presents with  . possible pink eye    possible pink eye- crusty and red   She is here with her caregiver for omplaints of bilateral eye drainage and redness ongoing for 3-4 weeks. Crusting to lower lids every morning. Was diagnosed with conjunctivities and prescribed eye drops at onset. Patient would not let anyone put drops in her eyes. Symptoms are not improving.   Denies fever, chills, eye pain, vision changes, N/V/D.    Review of Systems Pertinent positives and negatives in the history of present illness.     Objective:   Physical Exam  Constitutional: She appears well-developed and well-nourished. No distress.  HENT:  Mouth/Throat: Oropharynx is clear and moist and mucous membranes are normal.  Eyes: EOM are normal. Pupils are equal, round, and reactive to light. Right eye exhibits discharge. Left eye exhibits discharge. Right conjunctiva is injected. Left conjunctiva is injected.  Upper and lower lids are erythematous and edematous. Crusting to lower lids.    BP 120/80   Pulse (!) 58   Temp 98.5 F (36.9 C) (Oral)   Wt 129 lb 12.8 oz (58.9 kg)   SpO2 98%       Assessment & Plan:  Acute conjunctivitis of both eyes, unspecified acute conjunctivitis type  Patient caregiver states patient will not let anyone put medication in her eyes so she is concerned that symptoms will not improve.  Plan to prescribe oral antibiotics. Prescription given to caregiver.  Follow up as needed

## 2016-04-27 ENCOUNTER — Encounter: Payer: Medicare Other | Admitting: Family Medicine

## 2016-04-27 ENCOUNTER — Telehealth: Payer: Self-pay | Admitting: Family Medicine

## 2016-04-27 NOTE — Telephone Encounter (Signed)
I am aware. Thanks.

## 2016-04-27 NOTE — Telephone Encounter (Signed)
Pt's caregiver, [name], called at 8:21 this morning stating they should have been here at 8:00 this morning for an appointment and [name] was sick last night which caused her to oversleep. She apologized and rescheduled appointment. [name] wants to apologize to [facility] and let her know that she will pay any missed appointment fee that's necessary

## 2016-04-28 ENCOUNTER — Encounter: Payer: Medicare Other | Admitting: Family Medicine

## 2016-05-04 ENCOUNTER — Ambulatory Visit (INDEPENDENT_AMBULATORY_CARE_PROVIDER_SITE_OTHER): Payer: Medicare Other | Admitting: Podiatry

## 2016-05-04 ENCOUNTER — Ambulatory Visit (INDEPENDENT_AMBULATORY_CARE_PROVIDER_SITE_OTHER): Payer: Medicare Other | Admitting: Family Medicine

## 2016-05-04 ENCOUNTER — Encounter: Payer: Self-pay | Admitting: Podiatry

## 2016-05-04 ENCOUNTER — Encounter: Payer: Self-pay | Admitting: Family Medicine

## 2016-05-04 VITALS — BP 120/70 | HR 79 | Wt 134.8 lb

## 2016-05-04 VITALS — BP 131/68 | HR 82 | Resp 18

## 2016-05-04 DIAGNOSIS — B351 Tinea unguium: Secondary | ICD-10-CM | POA: Diagnosis not present

## 2016-05-04 DIAGNOSIS — Z09 Encounter for follow-up examination after completed treatment for conditions other than malignant neoplasm: Secondary | ICD-10-CM

## 2016-05-04 DIAGNOSIS — M79676 Pain in unspecified toe(s): Secondary | ICD-10-CM

## 2016-05-04 DIAGNOSIS — I10 Essential (primary) hypertension: Secondary | ICD-10-CM

## 2016-05-04 NOTE — Progress Notes (Signed)
   Subjective:    Patient ID: Lisa Villegas, female    DOB: March 03, 1931, 83 y.o.   MRN: [id number]  HPI Chief Complaint  Patient presents with  . follow-up    follow-up on everything. eating more and doing more activities   She is here with her caregiver Lisa Villegas to follow up on weight, blood pressure and mood.  caregiver states patient is doing much better. Increased appetite and gaining weight. She is participating in activities.  Caregiver states she does not appear depressed any longer.  No concerns or complaints.     Review of Systems Pertinent positives and negatives in the history of present illness.     Objective:   Physical Exam BP 120/70   Pulse 79   Wt 134 lb 12.8 oz (61.1 kg)   Alert and oriented. No acute distress. Well appearing. Not otherwise examined.       Assessment & Plan:  Follow up  HTN (hypertension), benign  She appears to be doing well. Has gained weight. Blood pressure within goal range. Blood sugar at LTCF this morning in normal range. No concerns today. She may cut back to one ensure daily now that she is eating 3 meals per day.  Follow up in May for chronic health conditions and fasting labs.

## 2016-05-04 NOTE — Patient Instructions (Signed)

## 2016-05-04 NOTE — Progress Notes (Signed)
Patient ID: Lisa Villegas, female   DOB: 12/17/1930, 83 y.o.   MRN: [id number]    Subjective: This patient presents with caregiver in the treatment room complaining of elongated toenails which are uncomfortable walking wearing shoes and request toenail debridement.Patient's caregiver was present in the treatment room and also power of attorney is requesting replacement diabetic custom molded shoes. Patient has worn custom molded shoes for many years to accommodate her hyperpronated feet. Without custom molded shoes patient has extreme difficulty in walking. Patient is a known diabetic with some suggestion of peripheral neuropathy.   Objective: Patient is responsive questioning, however at times patients caregiver needs to further explain questioning DP pulses 2/4 bilaterally PT pulses 1/4 bilaterally Capillary reflex immediate bilaterally Sensation to 10 g monofilament or intact 5/5 bilaterally Vibratory sensation reactive bilaterally Ankle reflexes reactive bilaterally Surgical scar dorsal right midfoot Hammertoe second left Maximally pronated feet which function at in range of motion upon weight-bearing Patient has slow flat-footed gait No open skin lesions bilaterally The toenails are elongated, hypertrophic, discolored and tender to direct palpation 6-10 replacement diabetic custom molded shoes  Assessment: Symptomatic onychomycoses 6-10 Diabetic withhistory ofperipheral neuropathy Gait disturbance associated with hyperpronation bilaterally  Plan: Debridement of toenails 6-10  Certification for diabetic shoes Patient requires custom molded shoes associated with hyperpronation and difficulty tolerating standard shoes Gait disturbance Hyperpronation bilaterally History of neuropathy    Plan: Debride toenails 6-10 mechanically and electrically without any bleeding  reappoint times three-month 

## 2016-05-12 ENCOUNTER — Encounter: Payer: Self-pay | Admitting: Family Medicine

## 2016-06-07 ENCOUNTER — Telehealth: Payer: Self-pay

## 2016-06-07 MED ORDER — FESOTERODINE FUMARATE ER 8 MG PO TB24: 8.0000 mg | ORAL_TABLET | Freq: Every day | ORAL | 2 refills | Status: DC

## 2016-06-07 NOTE — Telephone Encounter (Signed)
Sent in refill

## 2016-06-07 NOTE — Telephone Encounter (Signed)
Fax request rcvd for Toviaz 8mg  to [facility].

## 2016-06-22 ENCOUNTER — Telehealth: Payer: Self-pay | Admitting: Family Medicine

## 2016-06-22 NOTE — Telephone Encounter (Signed)
Caregiver, [name], called stating that she thinks pt needs an antidepressant and she may need to come in for a  f/u appointment anyway. Does she need to come in for an appt?

## 2016-06-22 NOTE — Telephone Encounter (Signed)
Please call Lisa Villegas and let her know that she and I have discussed whether to put [facility] on medication for depression and decided against this. If she feels like [name] is getting worse then have her bring her in.

## 2016-06-22 NOTE — Telephone Encounter (Signed)
Lisa Villegas at [facility] verified that pt uses [facility] for diabetic supplies

## 2016-06-23 NOTE — Telephone Encounter (Signed)
Left a detailed message about pt on linda's phone

## 2016-07-01 ENCOUNTER — Telehealth: Payer: Self-pay

## 2016-07-01 NOTE — Telephone Encounter (Signed)
Spoke with Lisa Villegas. I faxed it over around 3ish and will fax it over again NOW- At 4:45pm.

## 2016-07-01 NOTE — Telephone Encounter (Signed)
[name] called about pt's prescription. Please call her back at [phone].  Thanks, RLB

## 2016-07-14 ENCOUNTER — Telehealth: Payer: Self-pay

## 2016-07-14 ENCOUNTER — Ambulatory Visit (INDEPENDENT_AMBULATORY_CARE_PROVIDER_SITE_OTHER): Payer: Medicare Other | Admitting: Family Medicine

## 2016-07-14 ENCOUNTER — Encounter: Payer: Self-pay | Admitting: Family Medicine

## 2016-07-14 VITALS — BP 128/78 | HR 78 | Wt 138.0 lb

## 2016-07-14 DIAGNOSIS — I10 Essential (primary) hypertension: Secondary | ICD-10-CM

## 2016-07-14 DIAGNOSIS — E119 Type 2 diabetes mellitus without complications: Secondary | ICD-10-CM

## 2016-07-14 LAB — POCT GLYCOSYLATED HEMOGLOBIN (HGB A1C): Hemoglobin A1C: 6.8

## 2016-07-14 MED ORDER — FESOTERODINE FUMARATE ER 8 MG PO TB24: 8.0000 mg | ORAL_TABLET | Freq: Every day | ORAL | 2 refills | Status: DC

## 2016-07-14 NOTE — Patient Instructions (Signed)
Blood pressure is normal today.   Hemoglobin A1c is 6.8% today. No changes to medications.   Return in August for fasting labs and medicare annual wellness visit.

## 2016-07-14 NOTE — Progress Notes (Signed)
Subjective:    Patient ID: Lisa Villegas, female    DOB: July 13, 1930, 83 y.o.   MRN: [id number]  Lisa Villegas is a 83 y.o. female who presents for follow-up of Type 2 diabetes mellitus.  She is here with caregiver [name]. No new concerns or complaints today.  BP has been intermittently out of goal range per nursing home records but overall readings are within goal range.  Per caregiver, patient has been eating better but continues to choose to stay in her room and not participate in activities at her long term care facility. Poor hygiene per caregiver but this is not new.   Denies fever, chills, weight loss, chest pain, shortness of breath, cough, abdominal pain, vomiting, diarrhea.   Patient is checking home blood sugars.   Home blood sugar records: BGs are running  consistent with Hgb A1C How often is blood sugars being checked: once daily per nursing staff at LTCF Current symptoms include: none. Patient denies foot ulcerations, hyperglycemia, hypoglycemia , nausea, paresthesia of the feet, polydipsia, polyuria and visual disturbances.  Patient nursing home staff is checking th feet daily. Any Foot concerns (callous, ulcer, wound, thickened nails, toenail fungus, skin fungus, hammer toe): none. Sees a podiatrist regularly.  Last dilated eye exam: February 2018   Current treatments: Discontinued metformin which has been somewhat effective. Medication compliance: excellent  Current diet: in general, a "healthy" diet   Current exercise: none Known diabetic complications: none  The following portions of the patient's history were reviewed and updated as appropriate: allergies, current medications, past medical history, past social history and problem list.  ROS as in subjective above.     Objective:    Physical Exam Alert and in no distress.  Pharyngeal area is normal. Neck is supple without adenopathy or thyromegaly. Cardiac exam shows a regular sinus rhythm without murmurs  or gallops. Lungs are clear to auscultation. Extremities without edema, normal pulses.    Blood pressure 128/78, pulse 78, weight 138 lb (62.6 kg).  Lab Review Diabetic Labs Latest Ref Rng & Units 07/14/2016 03/15/2016 03/14/2016 02/05/2016 09/26/2015  HbA1c - 6.8% - 6.2(H) - 7.1% previous practice results  Creatinine 0.60 - 0.88 mg/dL - 0.93(H) 0.96(H) 1.00 -   BP/Weight 07/14/2016 05/04/2016 05/04/2016 04/22/2016 07/22/8339  Systolic BP 962 229 798 921 194  Diastolic BP 78 70 68 80 80  Wt. (Lbs) 138 134.8 - 129.8 131   Foot/eye exam completion dates Latest Ref Rng & Units 02/04/2015  Eye Exam No Retinopathy No Retinopathy  Foot Form Completion - -    Gennette Pac  reports that she has never smoked. She has never used smokeless tobacco. She reports that she does not drink alcohol or use drugs.     Assessment & Plan:    Controlled type 2 diabetes mellitus without complication, unspecified whether long term insulin use ([place]) - Plan: HgB A1c  HTN (hypertension), benign  Reviewed records from LTCF including daily weights, blood sugars and blood pressures.  1. Rx changes: none hemoglobin A1c is 6.8% and within goal. No changes to medications.  2. Education: Reviewed 'ABCs' of diabetes management (respective goals in parentheses):  A1C (<7), blood pressure (<130/80), and cholesterol (LDL <100). 3. Compliance at present is estimated to be good. Efforts to improve compliance (if necessary) will be directed at increased exercise and regular blood sugar monitoring: daily. 4. HTN- blood pressure is within goal.  5. Discussed that her weight is stable and no evidence of malnutrition.  6. Follow  up: 4 months for fasting lipids and AWV.

## 2016-07-14 NOTE — Telephone Encounter (Signed)
done

## 2016-07-14 NOTE — Telephone Encounter (Signed)
Is this okay to refill? 

## 2016-07-14 NOTE — Telephone Encounter (Signed)
Fax request for Toviaz 8mg  to [facility]. Victorino December

## 2016-07-14 NOTE — Telephone Encounter (Signed)
ok 

## 2016-08-03 ENCOUNTER — Ambulatory Visit: Payer: Medicare Other | Admitting: Podiatry

## 2016-08-11 ENCOUNTER — Encounter: Payer: Self-pay | Admitting: Family Medicine

## 2016-08-11 ENCOUNTER — Ambulatory Visit (INDEPENDENT_AMBULATORY_CARE_PROVIDER_SITE_OTHER): Payer: Medicare Other | Admitting: Family Medicine

## 2016-08-11 VITALS — BP 140/80 | HR 74 | Temp 98.3°F | Wt 137.0 lb

## 2016-08-11 DIAGNOSIS — L853 Xerosis cutis: Secondary | ICD-10-CM

## 2016-08-11 DIAGNOSIS — H1031 Unspecified acute conjunctivitis, right eye: Secondary | ICD-10-CM | POA: Diagnosis not present

## 2016-08-11 MED ORDER — AZITHROMYCIN 250 MG PO TABS: ORAL_TABLET | ORAL | 0 refills | Status: DC

## 2016-08-11 NOTE — Patient Instructions (Signed)
Take the antibiotic and if not back to normal in 10 days let me know.

## 2016-08-11 NOTE — Progress Notes (Signed)
   Subjective:    Patient ID: Lisa Villegas, female    DOB: [birth year], 83 y.o.   MRN: [id number]  HPI Chief Complaint  Patient presents with  . sore on lip and eyes    sore on lip was flared up and eyes red with pus. really bad yesterday   She is here with her caregiver [name] with complaints of right redness and eye drainage that is yellowish-green thick and eyelid matted together this morning. She has been using clear eyes for possible allergic conjunctivitis but this has not been helping. They also report that the patient fights the staff at the facility with putting in the eyedrops.  Caregiver states she has also had redness and possible infection to the right corner of her mouth.   Denies fever, chills, unexplained weight loss, N/V/D.   States she saw her dentist yesterday and audiologist as well.   Reviewed allergies, medications, past medical, surgical,  and social history.    Review of Systems Pertinent positives and negatives in the history of present illness.     Objective:   Physical Exam  Constitutional: She appears well-developed and well-nourished. She does not have a sickly appearance. No distress.  HENT:  Right Ear: Tympanic membrane and ear canal normal.  Left Ear: Tympanic membrane and ear canal normal.  Nose: Nose normal. Right sinus exhibits no maxillary sinus tenderness and no frontal sinus tenderness. Left sinus exhibits no maxillary sinus tenderness and no frontal sinus tenderness.  Mouth/Throat: Uvula is midline, oropharynx is clear and moist and mucous membranes are normal.    Dry and scaling pink skin that appears to be healing.   Eyes: EOM are normal. Pupils are equal, round, and reactive to light. Right eye exhibits no discharge. Right conjunctiva is injected. Left conjunctiva is not injected.  Right upper lid mildly edematous. No drainage   BP 140/80   Pulse 74   Temp 98.3 F (36.8 C) (Oral)   Wt 137 lb (62.1 kg)        Assessment &  Plan:  Acute conjunctivitis of right eye, unspecified acute conjunctivitis type  Dry skin  Plan to treat her with an oral antibiotic since she is unable to tolerate eye drops or ointment. She does not allow her caregiver or the long term care facility to get close to her eyes. She may also have a healing infection to her right lateral lip and will cover this as well.  Follow up if getting worse or not back to baseline.

## 2016-08-31 ENCOUNTER — Encounter: Payer: Self-pay | Admitting: Podiatry

## 2016-08-31 ENCOUNTER — Ambulatory Visit (INDEPENDENT_AMBULATORY_CARE_PROVIDER_SITE_OTHER): Payer: Medicare Other | Admitting: Podiatry

## 2016-08-31 VITALS — BP 142/99 | HR 89 | Resp 18

## 2016-08-31 DIAGNOSIS — B351 Tinea unguium: Secondary | ICD-10-CM

## 2016-08-31 DIAGNOSIS — M79676 Pain in unspecified toe(s): Secondary | ICD-10-CM | POA: Diagnosis not present

## 2016-08-31 NOTE — Progress Notes (Signed)
Patient ID: Lisa Villegas, female   DOB: [birth year], 83 y.o.   MRN: [id number]    Subjective: This patient presents with caregiver in the treatment room complaining of elongated toenails which are uncomfortable walking wearing shoes and request toenail debridement.Patient's caregiver was present in the treatment room and also power of attorney is requesting replacement diabetic custom molded shoes. Patient has worn custom molded shoes for many years to accommodate her hyperpronated feet. Without custom molded shoes patient has extreme difficulty in walking. Patient is a known diabetic with some suggestion of peripheral neuropathy.   Objective: Patient is responsive questioning, however at times patients caregiver needs to further explain questioning DP pulses 2/4 bilaterally PT pulses 1/4 bilaterally Capillary reflex immediate bilaterally Sensation to 10 g monofilament or intact 5/5 bilaterally Vibratory sensation reactive bilaterally Ankle reflexes reactive bilaterally Surgical scar dorsal right midfoot Hammertoe second left Maximally pronated feet which function at in range of motion upon weight-bearing Patient has slow flat-footed gait No open skin lesions bilaterally The toenails are elongated, hypertrophic, discolored and tender to direct palpation 6-10 replacement diabetic custom molded shoes  Assessment: Symptomatic onychomycoses 6-10 Diabetic withhistory ofperipheral neuropathy Gait disturbance associated with hyperpronation bilaterally  Plan: Debridement of toenails 5-24  Certification for diabetic shoes Patient requires custom molded shoes associated with hyperpronation and difficulty tolerating standard shoes Gait disturbance Hyperpronation bilaterally History of neuropathy    Plan: Debride toenails 6-10 mechanically and electrically without any bleeding  reappoint times three-month

## 2016-08-31 NOTE — Patient Instructions (Signed)

## 2016-09-27 ENCOUNTER — Telehealth: Payer: Self-pay | Admitting: Family Medicine

## 2016-09-27 MED ORDER — ATORVASTATIN CALCIUM 20 MG PO TABS: 20.0000 mg | ORAL_TABLET | Freq: Every evening | ORAL | 0 refills | Status: DC

## 2016-09-27 NOTE — Telephone Encounter (Signed)
Please call and see if the skilled nursing facility can do fasting lipids. If not, then she can come in here for fasting lipids. Ok to refill if she has been taking this but we should check her cholesterol.

## 2016-09-27 NOTE — Telephone Encounter (Signed)
Called [facility] and asked if they could do labs there, she says yes they could but I would need to fax over a order for labs to brittany sams Doctor, general practice). Please write a script so I can fax over. I have refilled med for 30 days

## 2016-09-27 NOTE — Telephone Encounter (Signed)
Pt has not had her lipids updated since 06/2015. Is this okay to refill

## 2016-09-27 NOTE — Telephone Encounter (Signed)
Prescription given for BMP and fasting lipids

## 2016-09-27 NOTE — Telephone Encounter (Signed)
Fax recv'd pt needs refill atorvastatin 20mg  #30 to [facility]

## 2016-09-29 DIAGNOSIS — D649 Anemia, unspecified: Secondary | ICD-10-CM | POA: Diagnosis not present

## 2016-09-29 DIAGNOSIS — E785 Hyperlipidemia, unspecified: Secondary | ICD-10-CM | POA: Diagnosis not present

## 2016-09-29 DIAGNOSIS — I10 Essential (primary) hypertension: Secondary | ICD-10-CM | POA: Diagnosis not present

## 2016-09-29 NOTE — Telephone Encounter (Signed)
This was faxed on 09/27/16

## 2016-10-04 ENCOUNTER — Encounter: Payer: Self-pay | Admitting: Family Medicine

## 2016-10-16 ENCOUNTER — Other Ambulatory Visit: Payer: Self-pay | Admitting: Family Medicine

## 2016-10-17 ENCOUNTER — Other Ambulatory Visit: Payer: Self-pay | Admitting: Family Medicine

## 2016-10-17 MED ORDER — ATORVASTATIN CALCIUM 20 MG PO TABS: 20.0000 mg | ORAL_TABLET | Freq: Every evening | ORAL | 0 refills | Status: DC

## 2016-10-20 ENCOUNTER — Other Ambulatory Visit: Payer: Self-pay | Admitting: Family Medicine

## 2016-10-20 MED ORDER — ATORVASTATIN CALCIUM 20 MG PO TABS: 20.0000 mg | ORAL_TABLET | Freq: Every evening | ORAL | 6 refills | Status: DC

## 2016-10-20 NOTE — Progress Notes (Unsigned)
Fax came through from [facility] [place] for a printed hand script for atorvastatin. I have printed it and faxed it over

## 2016-10-25 ENCOUNTER — Other Ambulatory Visit: Payer: Self-pay | Admitting: Family Medicine

## 2016-10-25 MED ORDER — FESOTERODINE FUMARATE ER 8 MG PO TB24: ORAL_TABLET | ORAL | 5 refills | Status: DC

## 2016-10-25 NOTE — Telephone Encounter (Signed)
Is this okay to refill? 

## 2016-10-25 NOTE — Telephone Encounter (Signed)
This has to be printed and sent it to [facility] [place] as well. I have printed and faxed in

## 2016-10-25 NOTE — Addendum Note (Signed)
Addended by: Minette Headland A on: 10/25/2016 02:45 PM   Modules accepted: Orders

## 2016-11-01 ENCOUNTER — Ambulatory Visit: Payer: Medicare Other | Admitting: Family Medicine

## 2016-11-08 ENCOUNTER — Ambulatory Visit: Payer: Medicare Other | Admitting: Family Medicine

## 2016-11-29 ENCOUNTER — Ambulatory Visit (INDEPENDENT_AMBULATORY_CARE_PROVIDER_SITE_OTHER): Payer: Medicare Other | Admitting: Family Medicine

## 2016-11-29 ENCOUNTER — Encounter: Payer: Self-pay | Admitting: Family Medicine

## 2016-11-29 ENCOUNTER — Other Ambulatory Visit: Payer: Self-pay | Admitting: Family Medicine

## 2016-11-29 VITALS — BP 124/80 | HR 72 | Ht 61.0 in | Wt 137.0 lb

## 2016-11-29 DIAGNOSIS — N189 Chronic kidney disease, unspecified: Secondary | ICD-10-CM

## 2016-11-29 DIAGNOSIS — N3942 Incontinence without sensory awareness: Secondary | ICD-10-CM

## 2016-11-29 DIAGNOSIS — Z0000 Encounter for general adult medical examination without abnormal findings: Secondary | ICD-10-CM | POA: Diagnosis not present

## 2016-11-29 DIAGNOSIS — I10 Essential (primary) hypertension: Secondary | ICD-10-CM | POA: Diagnosis not present

## 2016-11-29 DIAGNOSIS — E785 Hyperlipidemia, unspecified: Secondary | ICD-10-CM | POA: Diagnosis not present

## 2016-11-29 DIAGNOSIS — E559 Vitamin D deficiency, unspecified: Secondary | ICD-10-CM

## 2016-11-29 DIAGNOSIS — E119 Type 2 diabetes mellitus without complications: Secondary | ICD-10-CM | POA: Diagnosis not present

## 2016-11-29 DIAGNOSIS — Z23 Encounter for immunization: Secondary | ICD-10-CM | POA: Diagnosis not present

## 2016-11-29 DIAGNOSIS — M8580 Other specified disorders of bone density and structure, unspecified site: Secondary | ICD-10-CM

## 2016-11-29 LAB — CBC WITH DIFFERENTIAL/PLATELET
BASOS PCT: 0 %
Basophils Absolute: 0 cells/uL (ref 0–200)
EOS ABS: 148 {cells}/uL (ref 15–500)
Eosinophils Relative: 2 %
HEMATOCRIT: 37 % (ref 35.0–45.0)
HEMOGLOBIN: 11.4 g/dL — AB (ref 11.7–15.5)
LYMPHS ABS: 1702 {cells}/uL (ref 850–3900)
LYMPHS PCT: 23 %
MCH: 26.5 pg — ABNORMAL LOW (ref 27.0–33.0)
MCHC: 30.8 g/dL — ABNORMAL LOW (ref 32.0–36.0)
MCV: 86 fL (ref 80.0–100.0)
MONO ABS: 518 {cells}/uL (ref 200–950)
MPV: 8.7 fL (ref 7.5–12.5)
Monocytes Relative: 7 %
NEUTROS ABS: 5032 {cells}/uL (ref 1500–7800)
Neutrophils Relative %: 68 %
Platelets: 365 10*3/uL (ref 140–400)
RBC: 4.3 MIL/uL (ref 3.80–5.10)
RDW: 15.4 % — ABNORMAL HIGH (ref 11.0–15.0)
WBC: 7.4 10*3/uL (ref 4.0–10.5)

## 2016-11-29 LAB — POCT GLYCOSYLATED HEMOGLOBIN (HGB A1C): Hemoglobin A1C: 6.9

## 2016-11-29 NOTE — Progress Notes (Signed)
Lisa Villegas is a 83 y.o. female who presents for annual wellness visit and follow-up on chronic medical conditions.  She has the following concerns: Patient has no concerns. Caregiver is with her and concerned that patient is not engaging in activities at her LTCF, [facility]. States she is concerned that the patient is depressed.  Patient is aware of her medications and reports good compliance. No concerns with her medications.  No fall in the past 6 months or longer.  She is not exercising.    Caregiver is concerned that she does not eat healthy. Is not active. She is staying in bed more but patient states she has always liked staying in bed.  Caregiver has requested that she get PT in order to have something to do.   Caregiver who is patient's [facility] POA states patient has a DNR at the nursing home that she signed several years ago. Per caregiver, documents were done while she has been at her current LTCF in the past 4 years but they cannot find the document. Would like to fill these out again.    Immunization History  Administered Date(s) Administered  . Influenza, High Dose Seasonal PF 11/29/2016  . Influenza-Unspecified 12/28/2015  . Pneumococcal Conjugate-13 10/27/2015  . Pneumococcal Polysaccharide-23 11/29/2016   Last Pap smear: had hysterectomy Last mammogram: January 2018 Last colonoscopy: 2010 Last DEXA: 06/2015 Dentist:  Every 3 months Ophtho: in the past 6 months  Exercise: none  Other doctors caring for patient include: Dr. Little Ishikawa- podiatrist Dr. McDiarmid- urologist Dr. Thom Chimes- ophthomologist.  Dr. Cephus Richer- Dentist [facility] audiology.     Depression screen:  See questionnaire below.  Depression screen [facility] 2/9 11/29/2016 04/06/2016  Decreased Interest 0 2  Down, Depressed, Hopeless 0 2  PHQ - 2 Score 0 4  Altered sleeping - 3  Tired, decreased energy - 3  Change in appetite - 3  Feeling bad or failure about yourself  - 0  Trouble concentrating - 0   Moving slowly or fidgety/restless - 3  PHQ-9 Score - 16    Fall Risk Screen: see questionnaire below. Fall Risk  11/29/2016 11/24/2015  Falls in the past year? Yes No  Comment - Emmi Telephone Survey: data to providers prior to load  Number falls in past yr: 2 or more -  Injury with Fall? No -    ADL screen:  See questionnaire below Functional Status Survey: Is the patient deaf or have difficulty hearing?: Yes Does the patient have difficulty seeing, even when wearing glasses/contacts?: No Does the patient have difficulty concentrating, remembering, or making decisions?: Yes Does the patient have difficulty walking or climbing stairs?: No Does the patient have difficulty dressing or bathing?: No Does the patient have difficulty doing errands alone such as visiting a doctor's office or shopping?: Yes  Patient hearing is improved with hearing aids.  Mental status is at baseline per caregiver. History of autism, highly functioning. Math degree. End of Life Discussion:  Patient has POA and advance directives. DNR now filled out, see note above.  a living will and medical power of attorney  Review of Systems Constitutional: -fever, -chills, -sweats, -unexpected weight change, -anorexia, -fatigue Allergy: -sneezing, -itching, -congestion Dermatology: denies changing moles, rash, lumps, new worrisome lesions ENT: -runny nose, -ear pain, -sore throat, -hoarseness, -sinus pain, -teeth pain, -tinnitus, -hearing loss, -epistaxis Cardiology:  -chest pain, -palpitations, -edema, -orthopnea, -paroxysmal nocturnal dyspnea Respiratory: -cough, -shortness of breath, -dyspnea on exertion, -wheezing Gastroenterology: -abdominal pain, -nausea, -vomiting, -diarrhea, -constipation, -blood  in stool, +incontinence of feces (baseline), -dysphagia Hematology: -bleeding or bruising problems Musculoskeletal: -arthralgias, -myalgias, -joint swelling, -back pain, -neck pain, -cramping, -gait  changes Ophthalmology: -vision changes, -eye redness, -itching, -discharge Urology: -dysuria, -difficulty urinating, -hematuria, -urinary frequency, -urgency, history of incontinence at baseline Neurology: -headache, -weakness, -tingling, -numbness, -speech abnormality, -memory loss, -falls, -dizziness Psychology:  -depressed mood, -agitation, -sleep problems    PHYSICAL EXAM:  BP 124/80   Pulse 72   Ht 5\' 1"  (1.549 m)   Wt 137 lb (62.1 kg)   BMI 25.89 kg/m   General Appearance: Alert, cooperative, no distress, appears stated age Head: Normocephalic, without obvious abnormality, atraumatic Eyes: PERRL, conjunctiva/corneas clear, EOM's intact, fundi benign Ears: Normal TM's and external ear canals Nose: Nares normal, mucosa normal, no drainage or sinus  tenderness Throat: Lips, mucosa, and tongue normal; teeth and gums normal Neck: Supple, no lymphadenopathy; thyroid: no enlargement/tenderness/nodules; no carotid bruit or JVD Back: Spine nontender, no curvature, ROM normal, no CVA tenderness Lungs: Clear to auscultation bilaterally without wheezes, rales or ronchi; respirations unlabored Chest Wall: No tenderness or deformity Heart: Regular rate and rhythm, S1 and S2 normal, no murmur, rub or gallop Breast Exam: refused Abdomen: Soft, non-tender, nondistended, normoactive bowel sounds, no masses, no hepatosplenomegaly Genitalia: refused.  Extremities: No clubbing, cyanosis or edema Pulses: 2+ and symmetric all extremities Skin: Skin color, texture, turgor normal, no rashes or lesions Lymph nodes: Cervical, supraclavicular, and axillary nodes normal Neurologic: CNII-XII intact, normal strength, sensation and gait; reflexes 2+ and symmetric throughout Psych: Normal mood, flat affect (baseline), hygiene and grooming.  ASSESSMENT/PLAN: Medicare annual wellness visit, subsequent  Vitamin D deficiency - Plan: VITAMIN D 25 Hydroxy (Vit-D Deficiency, Fractures)  Hyperlipidemia,  unspecified hyperlipidemia type - Plan: Lipid panel  Urinary incontinence without sensory awareness  HTN (hypertension), benign - Plan: CBC with Differential/Platelet, COMPLETE METABOLIC PANEL WITH GFR  Controlled type 2 diabetes mellitus without complication, unspecified whether long term insulin use ([place]) - Plan: HgB A1c, Microalbumin/Creatinine Ratio, Urine, CBC with Differential/Platelet, COMPLETE METABOLIC PANEL WITH GFR  Osteopenia, unspecified location  Chronic kidney disease, unspecified CKD stage - Plan: COMPLETE METABOLIC PANEL WITH GFR  Immunization due - Plan: Flu vaccine HIGH DOSE PF (Fluzone High Dose), Pneumococcal polysaccharide vaccine 23-valent greater than or equal to 2yo subcutaneous/IM  She is stable on current medication regimen and no known side effects.  Hemoglobin A1c 6.9%. No hypoglycemia or hyperglycemia episodes.  Her weight is stable, no sign of malnutrition, will check labs. She will continue on glucerna bid.  BP is at goal. Continue on current medications.  History of osteopenia- continue on vitamin D, getting adequate calcium in diet or supplement and increase weight bearing exercises. No history of fracture.  Will check vitamin D level to determine if she is taking appropriate dose.  Will recheck renal function.  Fasting lipids checked. Continue on statin.  Physical and mental states appear to be at baseline.  DNR filled out per request of caregiver and patient and will be scanned into the system.  Follow up pending labs.  Will request Tdap history from LTCF.    Discussed monthly self breast exams and yearly mammograms; at least 30 minutes of aerobic activity at least 5 days/week and weight-bearing exercise 2x/week; proper sunscreen use reviewed; healthy diet, including goals of calcium and vitamin D intake and alcohol recommendations (less than or equal to 1 drink/day) reviewed; regular seatbelt use; changing batteries in smoke detectors.  Immunization  recommendations discussed.  Colonoscopy recommendations reviewed  Counseling  on pneumonia and high dose flu shot done.    Medicare Attestation I have personally reviewed: The patient's medical and social history Their use of alcohol, tobacco or illicit drugs Their current medications and supplements The patient's functional ability including ADLs,fall risks, home safety risks, cognitive, and hearing and visual impairment Diet and physical activities Evidence for depression or mood disorders  The patient's weight, height, and BMI have been recorded in the chart.  I have made referrals, counseling, and provided education to the patient based on review of the above and I have provided the patient with a written personalized care plan for preventive services.     Harland Dingwall, NP-C   11/29/2016

## 2016-11-30 ENCOUNTER — Encounter: Payer: Self-pay | Admitting: Family Medicine

## 2016-11-30 DIAGNOSIS — D649 Anemia, unspecified: Secondary | ICD-10-CM | POA: Insufficient documentation

## 2016-11-30 LAB — COMPLETE METABOLIC PANEL WITH GFR
ALBUMIN: 3.9 g/dL (ref 3.6–5.1)
ALK PHOS: 67 U/L (ref 33–130)
ALT: 12 U/L (ref 6–29)
AST: 13 U/L (ref 10–35)
BILIRUBIN TOTAL: 0.3 mg/dL (ref 0.2–1.2)
BUN: 23 mg/dL (ref 7–25)
CALCIUM: 9.4 mg/dL (ref 8.6–10.4)
CO2: 23 mmol/L (ref 20–32)
CREATININE: 0.79 mg/dL (ref 0.60–0.88)
Chloride: 102 mmol/L (ref 98–110)
GFR, Est African American: 79 mL/min (ref >=60)
GFR, Est Non African American: 69 mL/min (ref >=60)
Glucose, Bld: 135 mg/dL — ABNORMAL HIGH (ref 65–99)
Potassium: 4.4 mmol/L (ref 3.5–5.3)
Sodium: 138 mmol/L (ref 135–146)
Total Protein: 6.4 g/dL (ref 6.1–8.1)

## 2016-11-30 LAB — LIPID PANEL
Cholesterol: 150 mg/dL (ref <200)
HDL: 69 mg/dL (ref >50)
LDL Cholesterol: 62 mg/dL (ref <100)
Total CHOL/HDL Ratio: 2.2 Ratio (ref <5.0)
Triglycerides: 97 mg/dL (ref <150)
VLDL: 19 mg/dL (ref <30)

## 2016-11-30 LAB — MICROALBUMIN / CREATININE URINE RATIO
CREATININE, URINE: 116 mg/dL (ref 20–320)
Microalb Creat Ratio: 24 mcg/mg creat (ref <30)
Microalb, Ur: 2.8 mg/dL

## 2016-11-30 LAB — VITAMIN D 25 HYDROXY (VIT D DEFICIENCY, FRACTURES): VIT D 25 HYDROXY: 50 ng/mL (ref 30–100)

## 2016-12-01 ENCOUNTER — Other Ambulatory Visit: Payer: Self-pay | Admitting: Family Medicine

## 2016-12-01 ENCOUNTER — Encounter: Payer: Self-pay | Admitting: Physician Assistant

## 2016-12-01 DIAGNOSIS — D649 Anemia, unspecified: Secondary | ICD-10-CM

## 2016-12-01 LAB — IRON,TIBC AND FERRITIN PANEL
%SAT: 9 % — AB (ref 11–50)
Ferritin: 10 ng/mL — ABNORMAL LOW (ref 20–288)
Iron: 32 ug/dL — ABNORMAL LOW (ref 45–160)
TIBC: 367 ug/dL (ref 250–450)

## 2016-12-07 ENCOUNTER — Ambulatory Visit: Payer: Medicare Other | Admitting: Podiatry

## 2016-12-09 DIAGNOSIS — R2689 Other abnormalities of gait and mobility: Secondary | ICD-10-CM | POA: Diagnosis not present

## 2016-12-09 DIAGNOSIS — M6281 Muscle weakness (generalized): Secondary | ICD-10-CM | POA: Diagnosis not present

## 2016-12-09 DIAGNOSIS — R2681 Unsteadiness on feet: Secondary | ICD-10-CM | POA: Diagnosis not present

## 2016-12-12 DIAGNOSIS — R2689 Other abnormalities of gait and mobility: Secondary | ICD-10-CM | POA: Diagnosis not present

## 2016-12-12 DIAGNOSIS — M6281 Muscle weakness (generalized): Secondary | ICD-10-CM | POA: Diagnosis not present

## 2016-12-12 DIAGNOSIS — R2681 Unsteadiness on feet: Secondary | ICD-10-CM | POA: Diagnosis not present

## 2016-12-13 ENCOUNTER — Encounter: Payer: Self-pay | Admitting: Podiatry

## 2016-12-13 ENCOUNTER — Ambulatory Visit (INDEPENDENT_AMBULATORY_CARE_PROVIDER_SITE_OTHER): Payer: Medicare Other | Admitting: Podiatry

## 2016-12-13 DIAGNOSIS — B351 Tinea unguium: Secondary | ICD-10-CM

## 2016-12-13 DIAGNOSIS — M79676 Pain in unspecified toe(s): Secondary | ICD-10-CM

## 2016-12-13 NOTE — Patient Instructions (Signed)
    Subjective: This patient presents with caregiver in the treatment room complaining of elongated toenails which are uncomfortable walking wearing shoes and request toenail debridement.Patient's caregiver was present in the treatment room and also power of attorney is requesting replacement diabetic custom molded shoes. Patient has worn custom molded shoes for many years to accommodate her hyperpronated feet. Without custom molded shoes patient has extreme difficulty in walking. Patient is a known diabetic with some suggestion of peripheral neuropathy.   Objective: Patient is responsive questioning, however at times patients caregiver needs to further explain questioning DP pulses 2/4 bilaterally PT pulses 1/4 bilaterally Capillary reflex immediate bilaterally Sensation to 10 g monofilament or intact 5/5 bilaterally Vibratory sensation reactive bilaterally Ankle reflexes reactive bilaterally Surgical scar dorsal right midfoot Hammertoe second left Maximally pronated feet which function at in range of motion upon weight-bearing Patient has slow flat-footed gait No open skin lesions bilaterally The toenails are elongated, hypertrophic, discolored and tender to direct palpation 6-10 replacement diabetic custom molded shoes  Assessment: Symptomatic onychomycoses 6-10 Diabetic withhistory ofperipheral neuropathy Gait disturbance associated with hyperpronation bilaterally  Plan: Debridement of toenails 4-15  Certification for diabetic shoes Patient requires custom molded shoes associated with hyperpronation and difficulty tolerating standard shoes Gait disturbance Hyperpronation bilaterally History of neuropathy    Plan: Debride toenails 6-10 mechanically and electrically without any bleeding  Gean Birchwood DPM 12/13/2016 [facility]

## 2016-12-13 NOTE — Progress Notes (Signed)
Patient ID: Lisa Villegas, female   DOB: Apr 22, 1930, 83 y.o.   MRN: [id number]

## 2016-12-13 NOTE — Progress Notes (Signed)
Patient ID: Lisa Villegas, female   DOB: 22-Sep-1930, 83 y.o.   MRN: [id number]    Subjective: This patient presents with caregiver in the treatment room complaining of elongated toenails which are uncomfortable walking wearing shoes and request toenail debridement.Patient's caregiver was present in the treatment room and also power of attorney is requesting replacement diabetic custom molded shoes. Patient has worn custom molded shoes for many years to accommodate her hyperpronated feet. Without custom molded shoes patient has extreme difficulty in walking. Patient is a known diabetic with some suggestion of peripheral neuropathy.   Objective: Patient is responsive questioning, however at times patients caregiver needs to further explain questioning DP pulses 2/4 bilaterally PT pulses 1/4 bilaterally Capillary reflex immediate bilaterally Sensation to 10 g monofilament or intact 5/5 bilaterally Vibratory sensation reactive bilaterally Ankle reflexes reactive bilaterally Surgical scar dorsal right midfoot Hammertoe second left Maximally pronated feet which function at in range of motion upon weight-bearing Patient has slow flat-footed gait No open skin lesions bilaterally The toenails are elongated, hypertrophic, discolored and tender to direct palpation 6-10 replacement diabetic custom molded shoes  Assessment: Symptomatic onychomycoses 6-10 Diabetic withhistory ofperipheral neuropathy Gait disturbance associated with hyperpronation bilaterally  Plan: Debridement of toenails 0-51  Certification for diabetic shoes Patient requires custom molded shoes associated with hyperpronation and difficulty tolerating standard shoes Gait disturbance Hyperpronation bilaterally History of neuropathy    Plan: Debride toenails 6-10 mechanically and electrically without any bleeding  reappoint times three-month

## 2016-12-15 DIAGNOSIS — R2681 Unsteadiness on feet: Secondary | ICD-10-CM | POA: Diagnosis not present

## 2016-12-15 DIAGNOSIS — R2689 Other abnormalities of gait and mobility: Secondary | ICD-10-CM | POA: Diagnosis not present

## 2016-12-15 DIAGNOSIS — M6281 Muscle weakness (generalized): Secondary | ICD-10-CM | POA: Diagnosis not present

## 2016-12-16 ENCOUNTER — Ambulatory Visit (INDEPENDENT_AMBULATORY_CARE_PROVIDER_SITE_OTHER): Payer: Medicare Other | Admitting: Physician Assistant

## 2016-12-16 ENCOUNTER — Encounter: Payer: Self-pay | Admitting: Physician Assistant

## 2016-12-16 VITALS — BP 122/70 | HR 92 | Ht 61.75 in | Wt 140.0 lb

## 2016-12-16 DIAGNOSIS — D508 Other iron deficiency anemias: Secondary | ICD-10-CM

## 2016-12-16 NOTE — Patient Instructions (Signed)
You have been scheduled for a colonoscopy. Please follow written instructions given to you at your visit today.  We have provided you with a prep for the colonoscopy. If you use inhalers (even only as needed), please bring them with you on the day of your procedure.  If you are age 81 or older, your body mass index should be between 23-30. Your Body mass index is 25.81 kg/m. If this is out of the aforementioned range listed, please consider follow up with your Primary Care Provider.

## 2016-12-16 NOTE — Progress Notes (Signed)
Reviewed and agree with management plan.  Kyser Wandel T. Taishawn Smaldone, MD FACG 

## 2016-12-16 NOTE — Progress Notes (Signed)
Subjective:    Patient ID: Lisa Villegas, female    DOB: [birth year], 83 y.o.   MRN: [id number]  HPI Lisa Villegas is a pleasant 83 year old white female, new to GI today referred by [facility]/Vickie Henson NP for evaluation of iron deficiency anemia. Patient is Lisa Villegas is currently a resident of [facility]. She is accompanied today by her power of attorney Marney Doctor. She has history of hypertension, adult-onset diabetes mellitus, chronic kidney disease, very remote Hodgkin's lymphoma which has  been in remission over the past 40 years, anxiety and depression. Apparently patient has had previous history of iron deficiency and has required iron supplementation off and on through the years. I do not have any records to document that. Her power of attorney says that she did have a colonoscopy 7-10 years ago in [facility] which was negative. Her hemoglobin was 12.5  9 months ago and on recent check hemoglobin 11.4 hematocrit of 37, MCV of 82, ferritin 10, serum iron 32 and TIBC of 364 with iron saturation of 9. She's now on ferrous sulfate 325 twice daily. Patient has no complaints of abdominal pain, no recent changes in bowel habits but occasionally does have incontinence, she has not had Hemoccults done. No dysphagia or odynophagia, no nausea or vomiting. Apparently she had a significant weight loss after she was started on Januvia which affected her appetite. She has been off of Januvia for several months, her appetite is still not good but has managed to gain some weight back. Family history is negative for colon cancer and polyps. No regular aspirin or NSAID use  Review of Systems Pertinent positive and negative review of systems were noted in the above HPI section.  All other review of systems was otherwise negative.  Outpatient Encounter Prescriptions as of 12/16/2016  Medication Sig  . atorvastatin (LIPITOR) 20 MG tablet Take 1 tablet (20 mg total) by  mouth every evening.  . calcium carbonate (TUMS EX) 750 MG chewable tablet Chew 1 tablet by mouth daily.  . Cetylpyridinium Chloride (CREST PRO-HEALTH MT) Use as directed in the mouth or throat.  . Cholecalciferol (VITAMIN D3) 2000 UNITS TABS Take by mouth daily.  . ferrous sulfate 325 (65 FE) MG tablet Take 325 mg by mouth 2 (two) times daily.  . fesoterodine (TOVIAZ) 8 MG TB24 tablet TAKE 1 TAB BY MOUTH EVERY DAY.  DO NOT CRUSH OR CHEW .  . magnesium chloride (SLOW-MAG) 64 MG TBEC SR tablet Take 1 tablet by mouth 2 (two) times daily.   . metFORMIN (GLUCOPHAGE) 500 MG tablet Take 500-1,000 mg by mouth 2 (two) times daily. Takes 1 tablet in the morning and 2 tablets at bedtime  . Multiple Vitamins-Minerals (CERTA-VITE PO) Take by mouth.  . Nutritional Supplements (GLUCERNA PO) Take by mouth. 1 can every day  . Sodium Fluoride (PREVIDENT 5000 BOOSTER PLUS DT) Place onto teeth as directed.  . [DISCONTINUED] chlorhexidine (PERIDEX) 0.12 % solution Use as directed 15 mLs in the mouth or throat 2 (two) times daily.   No facility-administered encounter medications on file as of 12/16/2016.    Allergies  Allergen Reactions  . Penicillins Rash   Patient Active Problem List   Diagnosis Date Noted  . Anemia 11/30/2016  . Anxiety and depression   . Autism   . History of thyroid nodule   . Hodgkin disease ([place])   . HTN (hypertension), benign   . Incontinence   . Lack of sensation   .  Poor balance   . Chronic kidney disease (CKD)   . Osteopenia   . Vitamin D deficiency   . Follow-up examination for injury 02/08/2016  . Diabetes mellitus type 2, controlled, without complications ([place]) 24/82/5003  . Hypertension 01/01/2016  . Incontinence of feces 01/01/2016  . Urinary incontinence without sensory awareness 01/01/2016  . Hyperlipidemia 01/01/2016   Social History   Social History  . Marital status: Single    Spouse name: N/A  . Number of children: 0  . Years of education: N/A    Occupational History  . Not on file.   Social History Main Topics  . Smoking status: Never Smoker  . Smokeless tobacco: Never Used  . Alcohol use No  . Drug use: No  . Sexual activity: Not on file   Other Topics Concern  . Not on file   Social History Narrative   Lives at [facility], single, has a PhD in [place]    Ms. Nienow family history includes Atrial fibrillation in her father and mother; CAD in her father and mother.      Objective:    Vitals:   12/16/16 1049  BP: 122/70  Pulse: 92    Physical Exam  well-developed elderly white female, in no acute distress, accompanied by her power of attorney gives a lot of the history. Blood pressure 122/70 pulse 92, height 5 foot 1, weight 140, BMI 25.8. HEENT; nontraumatic normocephalic EOMI PERRLA sclera anicteric, Cardiovascular; regular rate and rhythm with S1-S2 no murmur or gallop, Pulmonary; clear bilaterally, Abdomen ;soft, nontender nondistended bowel sounds are active there is no palpable mass or hepatosplenomegaly, Rectal; exam. Scant stool, heme-negative no lesion palpated no hemorrhoids. Extremities; no clubbing cyanosis or edema, patient does have some foot deformity bilaterally, Neuropsych ;patient is autistic, mood and affect appropriate       Assessment & Plan:   #57  83 year old white female with iron deficiency anemia and drift in hemoglobin over the past several months. Heme-negative today She is asymptomatic from a GI perspective. Will need to rule out intermittent occult GI blood loss, versus malabsorption, #2 autism #3 hypertension #4 adult-onset diabetes mellitus #5.  Very Remote history of Hodgkin's lymphoma #6 anxiety/depression  Plan; Continue ferrous sulfate 325 mg by mouth twice a day Patient will be scheduled for colonoscopy and upper endoscopy with Dr. Fuller Plan. Both procedures discussed in detail with the patient and her power of attorney and they're agreeable to proceed.   Samanta Gal  S Meli Faley PA-C 12/16/2016   Cc: Henson, Vickie L, NP-C

## 2016-12-19 DIAGNOSIS — R2681 Unsteadiness on feet: Secondary | ICD-10-CM | POA: Diagnosis not present

## 2016-12-19 DIAGNOSIS — R2689 Other abnormalities of gait and mobility: Secondary | ICD-10-CM | POA: Diagnosis not present

## 2016-12-19 DIAGNOSIS — M6281 Muscle weakness (generalized): Secondary | ICD-10-CM | POA: Diagnosis not present

## 2016-12-21 DIAGNOSIS — M6281 Muscle weakness (generalized): Secondary | ICD-10-CM | POA: Diagnosis not present

## 2016-12-21 DIAGNOSIS — R2681 Unsteadiness on feet: Secondary | ICD-10-CM | POA: Diagnosis not present

## 2016-12-21 DIAGNOSIS — R2689 Other abnormalities of gait and mobility: Secondary | ICD-10-CM | POA: Diagnosis not present

## 2016-12-23 DIAGNOSIS — M6281 Muscle weakness (generalized): Secondary | ICD-10-CM | POA: Diagnosis not present

## 2016-12-23 DIAGNOSIS — R2689 Other abnormalities of gait and mobility: Secondary | ICD-10-CM | POA: Diagnosis not present

## 2016-12-23 DIAGNOSIS — R2681 Unsteadiness on feet: Secondary | ICD-10-CM | POA: Diagnosis not present

## 2016-12-27 DIAGNOSIS — R2681 Unsteadiness on feet: Secondary | ICD-10-CM | POA: Diagnosis not present

## 2016-12-27 DIAGNOSIS — R2689 Other abnormalities of gait and mobility: Secondary | ICD-10-CM | POA: Diagnosis not present

## 2016-12-27 DIAGNOSIS — M6281 Muscle weakness (generalized): Secondary | ICD-10-CM | POA: Diagnosis not present

## 2016-12-29 DIAGNOSIS — R2681 Unsteadiness on feet: Secondary | ICD-10-CM | POA: Diagnosis not present

## 2016-12-29 DIAGNOSIS — R2689 Other abnormalities of gait and mobility: Secondary | ICD-10-CM | POA: Diagnosis not present

## 2016-12-29 DIAGNOSIS — M6281 Muscle weakness (generalized): Secondary | ICD-10-CM | POA: Diagnosis not present

## 2016-12-30 DIAGNOSIS — R2681 Unsteadiness on feet: Secondary | ICD-10-CM | POA: Diagnosis not present

## 2016-12-30 DIAGNOSIS — M6281 Muscle weakness (generalized): Secondary | ICD-10-CM | POA: Diagnosis not present

## 2016-12-30 DIAGNOSIS — R2689 Other abnormalities of gait and mobility: Secondary | ICD-10-CM | POA: Diagnosis not present

## 2017-01-03 DIAGNOSIS — M6281 Muscle weakness (generalized): Secondary | ICD-10-CM | POA: Diagnosis not present

## 2017-01-03 DIAGNOSIS — R2681 Unsteadiness on feet: Secondary | ICD-10-CM | POA: Diagnosis not present

## 2017-01-03 DIAGNOSIS — R2689 Other abnormalities of gait and mobility: Secondary | ICD-10-CM | POA: Diagnosis not present

## 2017-01-09 DIAGNOSIS — M6281 Muscle weakness (generalized): Secondary | ICD-10-CM | POA: Diagnosis not present

## 2017-01-09 DIAGNOSIS — R2681 Unsteadiness on feet: Secondary | ICD-10-CM | POA: Diagnosis not present

## 2017-01-09 DIAGNOSIS — R2689 Other abnormalities of gait and mobility: Secondary | ICD-10-CM | POA: Diagnosis not present

## 2017-01-10 DIAGNOSIS — R2689 Other abnormalities of gait and mobility: Secondary | ICD-10-CM | POA: Diagnosis not present

## 2017-01-10 DIAGNOSIS — R2681 Unsteadiness on feet: Secondary | ICD-10-CM | POA: Diagnosis not present

## 2017-01-10 DIAGNOSIS — M6281 Muscle weakness (generalized): Secondary | ICD-10-CM | POA: Diagnosis not present

## 2017-01-12 DIAGNOSIS — R2689 Other abnormalities of gait and mobility: Secondary | ICD-10-CM | POA: Diagnosis not present

## 2017-01-12 DIAGNOSIS — R2681 Unsteadiness on feet: Secondary | ICD-10-CM | POA: Diagnosis not present

## 2017-01-12 DIAGNOSIS — M6281 Muscle weakness (generalized): Secondary | ICD-10-CM | POA: Diagnosis not present

## 2017-01-16 DIAGNOSIS — R2689 Other abnormalities of gait and mobility: Secondary | ICD-10-CM | POA: Diagnosis not present

## 2017-01-16 DIAGNOSIS — R2681 Unsteadiness on feet: Secondary | ICD-10-CM | POA: Diagnosis not present

## 2017-01-16 DIAGNOSIS — M6281 Muscle weakness (generalized): Secondary | ICD-10-CM | POA: Diagnosis not present

## 2017-01-17 DIAGNOSIS — R2681 Unsteadiness on feet: Secondary | ICD-10-CM | POA: Diagnosis not present

## 2017-01-17 DIAGNOSIS — M6281 Muscle weakness (generalized): Secondary | ICD-10-CM | POA: Diagnosis not present

## 2017-01-17 DIAGNOSIS — R2689 Other abnormalities of gait and mobility: Secondary | ICD-10-CM | POA: Diagnosis not present

## 2017-01-24 DIAGNOSIS — M6281 Muscle weakness (generalized): Secondary | ICD-10-CM | POA: Diagnosis not present

## 2017-01-24 DIAGNOSIS — R2689 Other abnormalities of gait and mobility: Secondary | ICD-10-CM | POA: Diagnosis not present

## 2017-01-24 DIAGNOSIS — R2681 Unsteadiness on feet: Secondary | ICD-10-CM | POA: Diagnosis not present

## 2017-01-26 DIAGNOSIS — D126 Benign neoplasm of colon, unspecified: Secondary | ICD-10-CM

## 2017-01-26 HISTORY — DX: Benign neoplasm of colon, unspecified: D12.6

## 2017-02-01 ENCOUNTER — Ambulatory Visit (AMBULATORY_SURGERY_CENTER): Payer: Medicare Other | Admitting: Gastroenterology

## 2017-02-01 ENCOUNTER — Other Ambulatory Visit: Payer: Self-pay

## 2017-02-01 ENCOUNTER — Encounter: Payer: Self-pay | Admitting: Gastroenterology

## 2017-02-01 VITALS — BP 159/79 | HR 81 | Temp 96.6°F | Resp 14 | Ht 61.0 in | Wt 140.0 lb

## 2017-02-01 DIAGNOSIS — D508 Other iron deficiency anemias: Secondary | ICD-10-CM

## 2017-02-01 DIAGNOSIS — K209 Esophagitis, unspecified without bleeding: Secondary | ICD-10-CM

## 2017-02-01 DIAGNOSIS — K2100 Gastro-esophageal reflux disease with esophagitis, without bleeding: Secondary | ICD-10-CM

## 2017-02-01 DIAGNOSIS — K2270 Barrett's esophagus without dysplasia: Secondary | ICD-10-CM

## 2017-02-01 DIAGNOSIS — K228 Other specified diseases of esophagus: Secondary | ICD-10-CM | POA: Diagnosis not present

## 2017-02-01 DIAGNOSIS — D123 Benign neoplasm of transverse colon: Secondary | ICD-10-CM

## 2017-02-01 DIAGNOSIS — E119 Type 2 diabetes mellitus without complications: Secondary | ICD-10-CM | POA: Diagnosis not present

## 2017-02-01 DIAGNOSIS — R2689 Other abnormalities of gait and mobility: Secondary | ICD-10-CM | POA: Diagnosis not present

## 2017-02-01 DIAGNOSIS — M6281 Muscle weakness (generalized): Secondary | ICD-10-CM | POA: Diagnosis not present

## 2017-02-01 DIAGNOSIS — R2681 Unsteadiness on feet: Secondary | ICD-10-CM | POA: Diagnosis not present

## 2017-02-01 DIAGNOSIS — K229 Disease of esophagus, unspecified: Secondary | ICD-10-CM

## 2017-02-01 DIAGNOSIS — D122 Benign neoplasm of ascending colon: Secondary | ICD-10-CM | POA: Diagnosis not present

## 2017-02-01 DIAGNOSIS — D509 Iron deficiency anemia, unspecified: Secondary | ICD-10-CM | POA: Diagnosis not present

## 2017-02-01 MED ORDER — OMEPRAZOLE 40 MG PO CPDR: DELAYED_RELEASE_CAPSULE | ORAL | 11 refills | Status: DC

## 2017-02-01 MED ORDER — SODIUM CHLORIDE 0.9 % IV SOLN: 500.0000 mL | INTRAVENOUS | Status: DC

## 2017-02-01 NOTE — Progress Notes (Signed)
Called to room to assist during endoscopic procedure.  Patient ID and intended procedure confirmed with present staff. Received instructions for my participation in the procedure from the performing physician.  

## 2017-02-01 NOTE — Patient Instructions (Signed)
YOU HAD AN ENDOSCOPIC PROCEDURE TODAY AT [facility]:   Refer to the procedure report that was given to you for any specific questions about what was found during the examination.  If the procedure report does not answer your questions, please call your gastroenterologist to clarify.  If you requested that your care partner not be given the details of your procedure findings, then the procedure report has been included in a sealed envelope for you to review at your convenience later.  YOU SHOULD EXPECT: Some feelings of bloating in the abdomen. Passage of more gas than usual.  Walking can help get rid of the air that was put into your GI tract during the procedure and reduce the bloating. If you had a lower endoscopy (such as a colonoscopy or flexible sigmoidoscopy) you may notice spotting of blood in your stool or on the toilet paper. If you underwent a bowel prep for your procedure, you may not have a normal bowel movement for a few days.  Please Note:  You might notice some irritation and congestion in your nose or some drainage.  This is from the oxygen used during your procedure.  There is no need for concern and it should clear up in a day or so.  SYMPTOMS TO REPORT IMMEDIATELY:   Following lower endoscopy (colonoscopy or flexible sigmoidoscopy):  Excessive amounts of blood in the stool  Significant tenderness or worsening of abdominal pains  Swelling of the abdomen that is new, acute  Fever of 100F or higher   Following upper endoscopy (EGD)  Vomiting of blood or coffee ground material  New chest pain or pain under the shoulder blades  Painful or persistently difficult swallowing  New shortness of breath  Fever of 100F or higher  Black, tarry-looking stools  For urgent or emergent issues, a gastroenterologist can be reached at any hour by calling [phone].   DIET:  We do recommend a small meal at first, but then you may proceed to your regular diet.  Drink  plenty of fluids but you should avoid alcoholic beverages for 24 hours.  ACTIVITY:  You should plan to take it easy for the rest of today and you should NOT DRIVE or use heavy machinery until tomorrow (because of the sedation medicines used during the test).    FOLLOW UP: Our staff will call the number listed on your records the next business day following your procedure to check on you and address any questions or concerns that you may have regarding the information given to you following your procedure. If we do not reach you, we will leave a message.  However, if you are feeling well and you are not experiencing any problems, there is no need to return our call.  We will assume that you have returned to your regular daily activities without incident.  If any biopsies were taken you will be contacted by phone or by letter within the next 1-3 weeks.  Please call us at [phone] if you have not heard about the biopsies in 3 weeks.    SIGNATURES/CONFIDENTIALITY: You and/or your care partner have signed paperwork which will be entered into your electronic medical record.  These signatures attest to the fact that that the information above on your After Visit Summary has been reviewed and is understood.  Full responsibility of the confidentiality of this discharge information lies with you and/or your care-partner.   Resume medications. Information given on polyps,hemorrhoids,esophagitis,gastritis and hiatal hernia.

## 2017-02-01 NOTE — Op Note (Signed)
Lisa Villegas Patient Name: Lisa Villegas Procedure Date: 02/01/2017 2:28 PM MRN: [id number] Endoscopist: [name] , MD Age: 83 Referring MD:  Date of Birth: 1930-11-05 Gender: Female Account #: [id number] Procedure:                Upper GI endoscopy Indications:              Iron deficiency anemia Medicines:                Monitored Anesthesia Care Procedure:                Pre-Anesthesia Assessment:                           - Prior to the procedure, a History and Physical                            was performed, and patient medications and                            allergies were reviewed. The patient's tolerance of                            previous anesthesia was also reviewed. The risks                            and benefits of the procedure and the sedation                            options and risks were discussed with the patient.                            All questions were answered, and informed consent                            was obtained. Prior Anticoagulants: The patient has                            taken no previous anticoagulant or antiplatelet                            agents. ASA Grade Assessment: III - A patient with                            severe systemic disease. After reviewing the risks                            and benefits, the patient was deemed in                            satisfactory condition to undergo the procedure.                           After obtaining informed consent, the endoscope was  passed under direct vision. Throughout the                            procedure, the patient's blood pressure, pulse, and                            oxygen saturations were monitored continuously. The                            Endoscope was introduced through the mouth, and                            advanced to the second part of duodenum. The upper                            GI endoscopy was  accomplished without difficulty.                            The patient tolerated the procedure well. Scope In: Scope Out: Findings:                 LA Grade D (one or more mucosal breaks involving at                            least 75% of esophageal circumference) esophagitis                            with no bleeding was found in the distal esophagus.                           A single 7 mm mucosal nodule with a localized                            distribution was found at the gastroesophageal                            junction, 30 cm from the incisors. Biopsies were                            taken with a cold forceps for histology.                           The exam of the esophagus was otherwise normal.                           A large hiatal hernia was present.                           The exam of the stomach was otherwise normal.                           Localized moderate inflammation characterized by  erythema and granularity was found in the duodenal                            bulb.                           The second portion of the duodenum was normal. Complications:            No immediate complications. Estimated Blood Loss:     Estimated blood loss was minimal. Impression:               - LA Grade D reflux esophagitis.                           - Mucosal nodule found in the esophagus. Biopsied.                           - Large hiatal hernia.                           - Duodenitis.                           - Normal second portion of the duodenum. Recommendation:           - Patient has a contact number available for                            emergencies. The signs and symptoms of potential                            delayed complications were discussed with the                            patient. Return to normal activities tomorrow.                            Written discharge instructions were provided to the                             patient.                           - Resume previous diet.                           - Follow antireflux measures.                           - Continue present medications.                           - Prilosec (omeprazole) 40 mg PO BID for 3 months,                            then omeprazole 40 mg po qam long term.                           -  GI office appt in 2 months. Consider repeat EGD                            to assess healing, evaluate for Barrett's. [name], MD 02/01/2017 3:11:36 PM This report has been signed electronically.

## 2017-02-01 NOTE — Op Note (Signed)
[place] Patient Name: Lisa Villegas Procedure Date: 02/01/2017 2:28 PM MRN: [id number] Endoscopist: [name] , MD Age: 83 Referring MD:  Date of Birth: 01/28/1931 Gender: Female Account #: [id number] Procedure:                Colonoscopy Indications:              Iron deficiency anemia Medicines:                Monitored Anesthesia Care Procedure:                Pre-Anesthesia Assessment:                           - Prior to the procedure, a History and Physical                            was performed, and patient medications and                            allergies were reviewed. The patient's tolerance of                            previous anesthesia was also reviewed. The risks                            and benefits of the procedure and the sedation                            options and risks were discussed with the patient.                            All questions were answered, and informed consent                            was obtained. Prior Anticoagulants: The patient has                            taken no previous anticoagulant or antiplatelet                            agents. ASA Grade Assessment: III - A patient with                            severe systemic disease. After reviewing the risks                            and benefits, the patient was deemed in                            satisfactory condition to undergo the procedure.                           After obtaining informed consent, the colonoscope  was passed under direct vision. Throughout the                            procedure, the patient's blood pressure, pulse, and                            oxygen saturations were monitored continuously. The                            Colonoscope was introduced through the anus and                            advanced to the the cecum, identified by                            appendiceal orifice and ileocecal  valve. The                            ileocecal valve, appendiceal orifice, and rectum                            were photographed. The quality of the bowel                            preparation was good. The patient tolerated the                            procedure well. The colonoscopy was somewhat                            difficult due to significant looping and a tortuous                            colon. Successful completion of the procedure was                            aided by using manual pressure, withdrawing and                            reinserting the scope and straightening and                            shortening the scope to obtain bowel loop reduction. Scope In: 2:31:25 PM Scope Out: 2:52:39 PM Scope Withdrawal Time: 0 hours 14 minutes 13 seconds  Total Procedure Duration: 0 hours 21 minutes 14 seconds  Findings:                 The perianal and digital rectal examinations were                            normal.                           Two sessile polyps were found in the transverse  colon and ascending colon. The polyps were 7 mm in                            size. These polyps were removed with a cold snare.                            Resection and retrieval were complete.                           Two sessile polyps were found in the ascending                            colon. The polyps were 4 to 5 mm in size. These                            polyps were removed with a cold biopsy forceps.                            Resection and retrieval were complete.                           Three small localized angiodysplastic lesions                            without bleeding were found in the cecum.                           There was a medium-sized lipoma, 14 mm in diameter,                            in the transverse colon.                           Internal hemorrhoids were found during                            retroflexion. The  hemorrhoids were medium-sized and                            Grade I (internal hemorrhoids that do not prolapse).                           The exam was otherwise without abnormality on                            direct and retroflexion views. Complications:            No immediate complications. Estimated blood loss:                            None. Estimated Blood Loss:     Estimated blood loss: none. Impression:               - Two 7 mm polyps in the transverse colon and in  the ascending colon, removed with a cold snare.                            Resected and retrieved.                           - Two 4 to 5 mm polyps in the ascending colon,                            removed with a cold biopsy forceps. Resected and                            retrieved.                           - Three non-bleeding colonic angiodysplastic                            lesions.                           - Medium-sized lipoma in the transverse colon.                           - Internal hemorrhoids, medium.                           - The examination was otherwise normal on direct                            and retroflexion views. Recommendation:           - Patient has a contact number available for                            emergencies. The signs and symptoms of potential                            delayed complications were discussed with the                            patient. Return to normal activities tomorrow.                            Written discharge instructions were provided to the                            patient.                           - Resume previous diet.                           - Continue present medications.                           - Await pathology results.                           -  No repeat colonoscopy due to age.                           - Iron replacement as needed per PCP for losses                            from [facility] [name],  MD 02/01/2017 3:06:22 PM This report has been signed electronically.

## 2017-02-01 NOTE — Progress Notes (Signed)
Report to PACU, RN, vss, BBS= Clear.  

## 2017-02-02 ENCOUNTER — Telehealth: Payer: Self-pay | Admitting: *Deleted

## 2017-02-02 DIAGNOSIS — R2681 Unsteadiness on feet: Secondary | ICD-10-CM | POA: Diagnosis not present

## 2017-02-02 DIAGNOSIS — M6281 Muscle weakness (generalized): Secondary | ICD-10-CM | POA: Diagnosis not present

## 2017-02-02 DIAGNOSIS — R2689 Other abnormalities of gait and mobility: Secondary | ICD-10-CM | POA: Diagnosis not present

## 2017-02-02 NOTE — Telephone Encounter (Signed)
Left message on [name]'s # on call back f/u . No answer or answering machine on [facility] # left for f/u call

## 2017-02-02 NOTE — Telephone Encounter (Signed)
  Follow up Call-  Call back number 02/01/2017  Post procedure Call Back phone  # [phone] [facility], [name] [phone]  Permission to leave phone message Yes  Some recent data might be hidden    Spoke with nurse Mickel Baas Patient questions:  Do you have a fever, pain , or abdominal swelling? No. Pain Score  0 *  Have you tolerated food without any problems? Yes.    Have you been able to return to your normal activities? Yes.    Do you have any questions about your discharge instructions: Diet   No. Medications  No. Follow up visit  No.  Do you have questions or concerns about your Care? No.  Actions: * If pain score is 4 or above: No action needed, pain <4.

## 2017-02-06 DIAGNOSIS — M6281 Muscle weakness (generalized): Secondary | ICD-10-CM | POA: Diagnosis not present

## 2017-02-06 DIAGNOSIS — R2681 Unsteadiness on feet: Secondary | ICD-10-CM | POA: Diagnosis not present

## 2017-02-06 DIAGNOSIS — R2689 Other abnormalities of gait and mobility: Secondary | ICD-10-CM | POA: Diagnosis not present

## 2017-02-08 DIAGNOSIS — Z23 Encounter for immunization: Secondary | ICD-10-CM | POA: Diagnosis not present

## 2017-02-08 DIAGNOSIS — R2681 Unsteadiness on feet: Secondary | ICD-10-CM | POA: Diagnosis not present

## 2017-02-08 DIAGNOSIS — R2689 Other abnormalities of gait and mobility: Secondary | ICD-10-CM | POA: Diagnosis not present

## 2017-02-08 DIAGNOSIS — M6281 Muscle weakness (generalized): Secondary | ICD-10-CM | POA: Diagnosis not present

## 2017-02-10 DIAGNOSIS — R2689 Other abnormalities of gait and mobility: Secondary | ICD-10-CM | POA: Diagnosis not present

## 2017-02-10 DIAGNOSIS — M6281 Muscle weakness (generalized): Secondary | ICD-10-CM | POA: Diagnosis not present

## 2017-02-10 DIAGNOSIS — R2681 Unsteadiness on feet: Secondary | ICD-10-CM | POA: Diagnosis not present

## 2017-02-13 DIAGNOSIS — R2689 Other abnormalities of gait and mobility: Secondary | ICD-10-CM | POA: Diagnosis not present

## 2017-02-13 DIAGNOSIS — R2681 Unsteadiness on feet: Secondary | ICD-10-CM | POA: Diagnosis not present

## 2017-02-13 DIAGNOSIS — M6281 Muscle weakness (generalized): Secondary | ICD-10-CM | POA: Diagnosis not present

## 2017-02-14 DIAGNOSIS — R2689 Other abnormalities of gait and mobility: Secondary | ICD-10-CM | POA: Diagnosis not present

## 2017-02-14 DIAGNOSIS — R2681 Unsteadiness on feet: Secondary | ICD-10-CM | POA: Diagnosis not present

## 2017-02-14 DIAGNOSIS — M6281 Muscle weakness (generalized): Secondary | ICD-10-CM | POA: Diagnosis not present

## 2017-02-17 DIAGNOSIS — R2689 Other abnormalities of gait and mobility: Secondary | ICD-10-CM | POA: Diagnosis not present

## 2017-02-17 DIAGNOSIS — M6281 Muscle weakness (generalized): Secondary | ICD-10-CM | POA: Diagnosis not present

## 2017-02-17 DIAGNOSIS — R2681 Unsteadiness on feet: Secondary | ICD-10-CM | POA: Diagnosis not present

## 2017-02-20 DIAGNOSIS — R2681 Unsteadiness on feet: Secondary | ICD-10-CM | POA: Diagnosis not present

## 2017-02-20 DIAGNOSIS — M6281 Muscle weakness (generalized): Secondary | ICD-10-CM | POA: Diagnosis not present

## 2017-02-20 DIAGNOSIS — R2689 Other abnormalities of gait and mobility: Secondary | ICD-10-CM | POA: Diagnosis not present

## 2017-02-21 ENCOUNTER — Telehealth: Payer: Self-pay | Admitting: Family Medicine

## 2017-02-21 ENCOUNTER — Encounter: Payer: Self-pay | Admitting: Gastroenterology

## 2017-02-21 NOTE — Telephone Encounter (Signed)
Pt's caregiver, [name], called requesting that Vickie sent over an order for the pt to be given the dietary supplement [name] thinks it was Ensure) twice a day while at the assisted living facility. [name] said [facility] stopped giving the supplement to the pt and instructions were to give it to her once a day but [name] would like for her to have it twice a day. Fax order to [facility].  Also [name] said she need another handicapped parking placard. Form placed in Vickie's folder.

## 2017-02-22 ENCOUNTER — Telehealth: Payer: Self-pay | Admitting: Family Medicine

## 2017-02-22 DIAGNOSIS — M6281 Muscle weakness (generalized): Secondary | ICD-10-CM | POA: Diagnosis not present

## 2017-02-22 DIAGNOSIS — R2681 Unsteadiness on feet: Secondary | ICD-10-CM | POA: Diagnosis not present

## 2017-02-22 DIAGNOSIS — R2689 Other abnormalities of gait and mobility: Secondary | ICD-10-CM | POA: Diagnosis not present

## 2017-02-22 NOTE — Telephone Encounter (Signed)
[name], caregiver, called requesting a prescription for patient to have 2 Ensure or Glucerna twice daily due to poor diet. Prescription faxed to [facility]. She is also requesting renewal of handicap placard. This will be filled out as well.

## 2017-02-22 NOTE — Telephone Encounter (Signed)
Done. Knute Neu know please

## 2017-02-22 NOTE — Telephone Encounter (Signed)
Left message that form is ready for pick up

## 2017-02-23 DIAGNOSIS — M6281 Muscle weakness (generalized): Secondary | ICD-10-CM | POA: Diagnosis not present

## 2017-02-23 DIAGNOSIS — R2681 Unsteadiness on feet: Secondary | ICD-10-CM | POA: Diagnosis not present

## 2017-02-23 DIAGNOSIS — R2689 Other abnormalities of gait and mobility: Secondary | ICD-10-CM | POA: Diagnosis not present

## 2017-02-27 DIAGNOSIS — R2689 Other abnormalities of gait and mobility: Secondary | ICD-10-CM | POA: Diagnosis not present

## 2017-02-27 DIAGNOSIS — M6281 Muscle weakness (generalized): Secondary | ICD-10-CM | POA: Diagnosis not present

## 2017-02-27 DIAGNOSIS — R2681 Unsteadiness on feet: Secondary | ICD-10-CM | POA: Diagnosis not present

## 2017-03-02 DIAGNOSIS — M6281 Muscle weakness (generalized): Secondary | ICD-10-CM | POA: Diagnosis not present

## 2017-03-02 DIAGNOSIS — R2681 Unsteadiness on feet: Secondary | ICD-10-CM | POA: Diagnosis not present

## 2017-03-02 DIAGNOSIS — R2689 Other abnormalities of gait and mobility: Secondary | ICD-10-CM | POA: Diagnosis not present

## 2017-03-03 DIAGNOSIS — M6281 Muscle weakness (generalized): Secondary | ICD-10-CM | POA: Diagnosis not present

## 2017-03-03 DIAGNOSIS — R2681 Unsteadiness on feet: Secondary | ICD-10-CM | POA: Diagnosis not present

## 2017-03-03 DIAGNOSIS — R2689 Other abnormalities of gait and mobility: Secondary | ICD-10-CM | POA: Diagnosis not present

## 2017-03-07 DIAGNOSIS — M6281 Muscle weakness (generalized): Secondary | ICD-10-CM | POA: Diagnosis not present

## 2017-03-07 DIAGNOSIS — R2681 Unsteadiness on feet: Secondary | ICD-10-CM | POA: Diagnosis not present

## 2017-03-07 DIAGNOSIS — R2689 Other abnormalities of gait and mobility: Secondary | ICD-10-CM | POA: Diagnosis not present

## 2017-03-14 ENCOUNTER — Ambulatory Visit: Payer: Medicare Other | Admitting: Podiatry

## 2017-03-27 ENCOUNTER — Ambulatory Visit: Payer: Medicare Other | Admitting: Podiatry

## 2017-04-05 ENCOUNTER — Ambulatory Visit: Payer: Medicare Other | Admitting: Podiatry

## 2017-04-07 ENCOUNTER — Ambulatory Visit: Payer: Medicare Other | Admitting: Gastroenterology

## 2017-04-12 ENCOUNTER — Ambulatory Visit: Payer: Medicare Other | Admitting: Podiatry

## 2017-04-21 ENCOUNTER — Ambulatory Visit (INDEPENDENT_AMBULATORY_CARE_PROVIDER_SITE_OTHER): Payer: Medicare Other | Admitting: Podiatry

## 2017-04-21 ENCOUNTER — Ambulatory Visit: Payer: Medicare Other | Admitting: Podiatry

## 2017-04-21 ENCOUNTER — Encounter: Payer: Self-pay | Admitting: Podiatry

## 2017-04-21 DIAGNOSIS — B351 Tinea unguium: Secondary | ICD-10-CM

## 2017-04-21 DIAGNOSIS — E1140 Type 2 diabetes mellitus with diabetic neuropathy, unspecified: Secondary | ICD-10-CM

## 2017-04-21 DIAGNOSIS — E1149 Type 2 diabetes mellitus with other diabetic neurological complication: Secondary | ICD-10-CM

## 2017-04-21 DIAGNOSIS — M79675 Pain in left toe(s): Secondary | ICD-10-CM

## 2017-04-21 DIAGNOSIS — M79674 Pain in right toe(s): Secondary | ICD-10-CM

## 2017-04-21 NOTE — Progress Notes (Signed)
Subjective:   Patient ID: Lisa Villegas, female   DOB: 84 y.o.   MRN: [id number]   HPI Patient presents with elongated nailbeds 1-5 both feet that are painful and make shoe gear difficult   ROS      Objective:  Physical Exam  Neurovascular status intact with thick nail disease 1-5 both feet with yellow brittle-like appearance and pain with palpation     Assessment:  Mycotic nail infection 1-5 both feet     Plan:  Debrided nailbeds 1-5 both feet with no iatrogenic bleeding noted

## 2017-04-28 ENCOUNTER — Encounter (HOSPITAL_COMMUNITY): Payer: Self-pay | Admitting: *Deleted

## 2017-04-28 ENCOUNTER — Other Ambulatory Visit: Payer: Self-pay

## 2017-04-28 ENCOUNTER — Emergency Department (HOSPITAL_COMMUNITY)
Admission: EM | Admit: 2017-04-28 | Discharge: 2017-04-28 | Disposition: A | Discharge status: Home / Self Care | Payer: Medicare Other | Attending: Emergency Medicine | Admitting: Emergency Medicine

## 2017-04-28 DIAGNOSIS — N183 Chronic kidney disease, stage 3 (moderate): Secondary | ICD-10-CM | POA: Diagnosis not present

## 2017-04-28 DIAGNOSIS — I130 Hypertensive heart and chronic kidney disease with heart failure and stage 1 through stage 4 chronic kidney disease, or unspecified chronic kidney disease: Secondary | ICD-10-CM | POA: Diagnosis not present

## 2017-04-28 DIAGNOSIS — R61 Generalized hyperhidrosis: Secondary | ICD-10-CM | POA: Diagnosis not present

## 2017-04-28 DIAGNOSIS — I959 Hypotension, unspecified: Secondary | ICD-10-CM | POA: Diagnosis not present

## 2017-04-28 DIAGNOSIS — W19XXXA Unspecified fall, initial encounter: Secondary | ICD-10-CM | POA: Insufficient documentation

## 2017-04-28 DIAGNOSIS — F840 Autistic disorder: Secondary | ICD-10-CM | POA: Diagnosis not present

## 2017-04-28 DIAGNOSIS — Z880 Allergy status to penicillin: Secondary | ICD-10-CM | POA: Insufficient documentation

## 2017-04-28 DIAGNOSIS — R4182 Altered mental status, unspecified: Secondary | ICD-10-CM | POA: Diagnosis not present

## 2017-04-28 DIAGNOSIS — E1122 Type 2 diabetes mellitus with diabetic chronic kidney disease: Secondary | ICD-10-CM | POA: Diagnosis not present

## 2017-04-28 DIAGNOSIS — I509 Heart failure, unspecified: Secondary | ICD-10-CM | POA: Diagnosis not present

## 2017-04-28 DIAGNOSIS — E1140 Type 2 diabetes mellitus with diabetic neuropathy, unspecified: Secondary | ICD-10-CM | POA: Diagnosis not present

## 2017-04-28 DIAGNOSIS — R231 Pallor: Secondary | ICD-10-CM | POA: Diagnosis not present

## 2017-04-28 DIAGNOSIS — R55 Syncope and collapse: Secondary | ICD-10-CM | POA: Diagnosis not present

## 2017-04-28 DIAGNOSIS — E860 Dehydration: Secondary | ICD-10-CM | POA: Diagnosis not present

## 2017-04-28 DIAGNOSIS — Z7984 Long term (current) use of oral hypoglycemic drugs: Secondary | ICD-10-CM | POA: Insufficient documentation

## 2017-04-28 DIAGNOSIS — Z79899 Other long term (current) drug therapy: Secondary | ICD-10-CM | POA: Insufficient documentation

## 2017-04-28 LAB — URINALYSIS, ROUTINE W REFLEX MICROSCOPIC
Bilirubin Urine: NEGATIVE
GLUCOSE, UA: NEGATIVE mg/dL
Ketones, ur: 15 mg/dL — AB
Nitrite: NEGATIVE
PH: 6 (ref 5.0–8.0)
PROTEIN: NEGATIVE mg/dL
SPECIFIC GRAVITY, URINE: 1.015 (ref 1.005–1.030)

## 2017-04-28 LAB — URINALYSIS, MICROSCOPIC (REFLEX)

## 2017-04-28 LAB — BASIC METABOLIC PANEL
ANION GAP: 14 (ref 5–15)
BUN: 26 mg/dL — AB (ref 6–20)
CHLORIDE: 102 mmol/L (ref 101–111)
CO2: 20 mmol/L — AB (ref 22–32)
Calcium: 9.7 mg/dL (ref 8.9–10.3)
Creatinine, Ser: 0.99 mg/dL (ref 0.44–1.00)
GFR calc Af Amer: 59 mL/min — ABNORMAL LOW (ref >60)
GFR, EST NON AFRICAN AMERICAN: 51 mL/min — AB (ref >60)
GLUCOSE: 137 mg/dL — AB (ref 65–99)
POTASSIUM: 4.6 mmol/L (ref 3.5–5.1)
Sodium: 136 mmol/L (ref 135–145)

## 2017-04-28 LAB — CBC WITH DIFFERENTIAL/PLATELET
BASOS ABS: 0 10*3/uL (ref 0.0–0.1)
Basophils Relative: 0 %
EOS PCT: 1 %
Eosinophils Absolute: 0.1 10*3/uL (ref 0.0–0.7)
HEMATOCRIT: 38.3 % (ref 36.0–46.0)
HEMOGLOBIN: 11.5 g/dL — AB (ref 12.0–15.0)
LYMPHS ABS: 1.2 10*3/uL (ref 0.7–4.0)
LYMPHS PCT: 11 %
MCH: 24.9 pg — ABNORMAL LOW (ref 26.0–34.0)
MCHC: 30 g/dL (ref 30.0–36.0)
MCV: 83.1 fL (ref 78.0–100.0)
Monocytes Absolute: 0.4 10*3/uL (ref 0.1–1.0)
Monocytes Relative: 3 %
NEUTROS ABS: 8.9 10*3/uL — AB (ref 1.7–7.7)
Neutrophils Relative %: 85 %
PLATELETS: 301 10*3/uL (ref 150–400)
RBC: 4.61 MIL/uL (ref 3.87–5.11)
RDW: 16.9 % — ABNORMAL HIGH (ref 11.5–15.5)
WBC: 10.5 10*3/uL (ref 4.0–10.5)

## 2017-04-28 LAB — I-STAT TROPONIN, ED: Troponin i, poc: 0 ng/mL (ref 0.00–0.08)

## 2017-04-28 MED ORDER — SODIUM CHLORIDE 0.9 % IV BOLUS (SEPSIS)
1000.0000 mL | Freq: Once | INTRAVENOUS | Status: AC
  Administered 2017-04-28: 1000 mL via INTRAVENOUS

## 2017-04-28 NOTE — Discharge Instructions (Signed)
You were evaluated in the emergency department for a fall.  Your lab testing looked fairly normal although you have urine culture pending.  You possibly are a little dehydrated and we gave you some IV fluids.  He should follow-up with your primary care doctor and return if any worsening.

## 2017-04-28 NOTE — ED Notes (Signed)
[facility] called  

## 2017-04-28 NOTE — ED Triage Notes (Signed)
Pt in from [facility], pt brought in via [place], per report pt had a suspected fall denies pain, denies LOC, pt denies falling, per report a CNA witnessed a fall, and one CNA reports that the fall was not witnessed, EMS reports pt was pale and clammy, pt at neuro baseline, pt is pale upon arrival to ED, pt was not orthostatic in route, pt ambulatory at baseline, pt at neuro baseline

## 2017-04-28 NOTE — ED Notes (Addendum)
Pt with old stool present in brief. This RN cleaned pt perineal area thoroughly and changed brief prior to obtaining urinalysis. Will discuss perineal care with facility when pt d/c.

## 2017-04-28 NOTE — ED Provider Notes (Signed)
[facility] EMERGENCY DEPARTMENT Provider Note   CSN: [id number] Arrival date & time: 04/28/17  1058     History   Chief Complaint Chief Complaint  Patient presents with  . Fall    HPI Lisa Villegas is a 84 y.o. female.  The history is provided by the patient and the EMS personnel.  Fall  This is a new problem. The current episode started 1 to 2 hours ago. Pertinent negatives include no chest pain, no abdominal pain, no headaches and no shortness of breath. Nothing aggravates the symptoms. Nothing relieves the symptoms. She has tried nothing for the symptoms.    84 year old female resident at a facility brought in by EMS today for possible fall.  Per EMS she was found on the ground by staff unclear if was witnessed or not.  Patient denies that she fell.  She states she is here because she looked pale.  Per EMS they found her initially very pale and diaphoretic but that has improved since transport.  Patient denies any complaints specifically no chest pain no shortness of breath no headache no head injury no back pain no weakness no numbness.  She was reportedly incontinent of urine but this is baseline per EMS Past Medical History:  Diagnosis Date  . Anemia   . Anxiety and depression    per med rec  . 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 Autism   . Chronic CHF (congestive heart failure) (HCC)    per med rec  . Chronic kidney disease (CKD)    stage 3 per chart in 09/2015  . Diabetes mellitus without complication ([place])   . Diabetic neuropathy ([place])   . History of thyroid nodule    nontoxic goiter per med rec  . Hodgkin disease ([place])    in 42  . Hodgkin's disease ([place])   . HTN (hypertension), benign   . Hyperlipidemia   . Incontinence   . Lack of sensation   . Osteopenia    per med rec  . Poor balance   . Tubular adenoma of colon 01/2017  . Vitamin D deficiency     Patient Active Problem List   Diagnosis Date Noted  . Anemia 11/30/2016  . Anxiety and  depression   . Autism   . History of thyroid nodule   . Hodgkin disease ([place])   . HTN (hypertension), benign   . Incontinence   . Lack of sensation   . Poor balance   . Chronic kidney disease (CKD)   . Osteopenia   . Vitamin D deficiency   . Follow-up examination for injury 02/08/2016  . Diabetes mellitus type 2, controlled, without complications ([place]) 81/19/1478  . Hypertension 01/01/2016  . Incontinence of feces 01/01/2016  . Urinary incontinence without sensory awareness 01/01/2016  . Hyperlipidemia 01/01/2016    Past Surgical History:  Procedure Laterality Date  . ABDOMINAL HYSTERECTOMY    . BREAST LUMPECTOMY Left     OB History    No data available       Home Medications    Prior to Admission medications   Medication Sig Start Date End Date Taking? Authorizing Provider  atorvastatin (LIPITOR) 20 MG tablet Take 1 tablet (20 mg total) by mouth every evening. 10/20/16   Henson, Vickie L, NP-C  calcium carbonate (TUMS EX) 750 MG chewable tablet Chew 1 tablet by mouth daily.    [provider]  Cetylpyridinium Chloride (CREST PRO-HEALTH MT) Use as directed in the mouth or throat.  [provider]  Cholecalciferol (VITAMIN D3) 2000 UNITS TABS Take by mouth daily.    [provider]  ferrous sulfate 325 (65 FE) MG tablet Take 325 mg by mouth 2 (two) times daily.    [provider]  fesoterodine (TOVIAZ) 8 MG TB24 tablet TAKE 1 TAB BY MOUTH EVERY DAY.  DO NOT CRUSH OR CHEW . 10/25/16   Denita Lung, MD  magnesium chloride (SLOW-MAG) 64 MG TBEC SR tablet Take 1 tablet by mouth 2 (two) times daily.     [provider]  metFORMIN (GLUCOPHAGE) 500 MG tablet Take 500-1,000 mg by mouth 2 (two) times daily. Takes 1 tablet in the morning and 2 tablets at bedtime    [provider]  Multiple Vitamins-Minerals (CERTA-VITE PO) Take by mouth.    [provider]  Nutritional Supplements (GLUCERNA PO) Take by mouth. 1 can  every day    [provider]  omeprazole (PRILOSEC) 40 MG capsule Take by mouth 40 mg bid for 3 months,then omeprazole 40 mg every a.m. 02/01/17   [name], MD  Sodium Fluoride (PREVIDENT 5000 BOOSTER PLUS DT) Place onto teeth as directed.    [provider]    Family History Family History  Problem Relation Age of Onset  . CAD Mother        died at 68 yo  . Atrial fibrillation Mother        and tachycardia  . CAD Father        died at 36 yo  . Atrial fibrillation Father        and tachycardia    Social History Social History   Tobacco Use  . Smoking status: Never Smoker  . Smokeless tobacco: Never Used  Substance Use Topics  . Alcohol use: No  . Drug use: No     Allergies   Penicillins   Review of Systems Review of Systems  Constitutional: Negative for chills and fever.  HENT: Negative for ear pain and sore throat.   Eyes: Negative for pain and visual disturbance.  Respiratory: Negative for cough and shortness of breath.   Cardiovascular: Negative for chest pain and palpitations.  Gastrointestinal: Negative for abdominal pain and vomiting.  Genitourinary: Negative for dysuria and hematuria.  Musculoskeletal: Negative for arthralgias and back pain.  Skin: Negative for color change and rash.  Neurological: Negative for seizures, syncope and headaches.  All other systems reviewed and are negative.    Physical Exam Updated Vital Signs BP 139/62 (BP Location: Right Arm)   Pulse 79   Temp 98.2 F (36.8 C) (Oral)   Resp 16   SpO2 100%   Physical Exam  Constitutional: She is oriented to person, place, and time. She appears well-developed and well-nourished. No distress.  HENT:  Head: Normocephalic and atraumatic.  Eyes: Conjunctivae are normal.  Neck: Neck supple.  Cardiovascular: Normal rate and regular rhythm.  No murmur heard. Pulmonary/Chest: Effort normal and breath sounds normal. No respiratory distress.  Abdominal: Soft.  There is no tenderness.  Musculoskeletal: Normal range of motion. She exhibits no edema or tenderness.  Neurological: She is alert and oriented to person, place, and time. She has normal strength. No cranial nerve deficit or sensory deficit.  Skin: Skin is dry. Capillary refill takes less than 2 seconds. There is pallor.  Psychiatric: She has a normal mood and affect.  Nursing note and vitals reviewed.    ED Treatments / Results  Labs (all labs ordered are listed,  but only abnormal results are displayed) Labs Reviewed  CBC WITH DIFFERENTIAL/PLATELET - Abnormal; Notable for the following components:      Result Value   Hemoglobin 11.5 (*)    MCH 24.9 (*)    RDW 16.9 (*)    Neutro Abs 8.9 (*)    All other components within normal limits  BASIC METABOLIC PANEL - Abnormal; Notable for the following components:   CO2 20 (*)    Glucose, Bld 137 (*)    BUN 26 (*)    GFR calc non Af Amer 51 (*)    GFR calc Af Amer 59 (*)    All other components within normal limits  URINALYSIS, ROUTINE W REFLEX MICROSCOPIC - Abnormal; Notable for the following components:   Hgb urine dipstick TRACE (*)    Ketones, ur 15 (*)    Leukocytes, UA SMALL (*)    All other components within normal limits  URINALYSIS, MICROSCOPIC (REFLEX) - Abnormal; Notable for the following components:   Bacteria, UA MANY (*)    Squamous Epithelial / LPF 0-5 (*)    All other components within normal limits  URINE CULTURE  I-STAT TROPONIN, ED    EKG  EKG Interpretation  Date/Time:  Friday April 28 2017 11:34:06 EST Ventricular Rate:  76 PR Interval:  130 QRS Duration: 90 QT Interval:  392 QTC Calculation: 441 R Axis:   68 Text Interpretation:  Normal sinus rhythm Normal ECG Confirmed by Aletta Edouard [phone]) on 04/28/2017 11:38:38 AM       Radiology No results found.  Procedures Procedures (including critical care time)  Medications Ordered in ED Medications  sodium chloride 0.9 % bolus 1,000 mL (not  administered)     Initial Impression / Assessment and Plan / ED Course  I have reviewed the triage vital signs and the nursing notes.  Pertinent labs & imaging results that were available during my care of the patient were reviewed by me and considered in my medical decision making (see chart for details).  Clinical Course as of Apr 29 614  Fri Apr 28, 2017  1404 Ambulated with nurse, minimal assistance.   [MB]    Clinical Course User Index [MB] Hayden Rasmussen, MD      Final Clinical Impressions(s) / ED Diagnoses   Final diagnoses:  Fall, initial encounter  Dehydration    ED Discharge Orders    None       Hayden Rasmussen, MD 04/29/17 [phone]

## 2017-04-28 NOTE — ED Notes (Signed)
Pt ambulatory to bathroom with steady gait. Denied dizziness, difficulty walking, or trouble balancing

## 2017-04-29 LAB — URINE CULTURE
Culture: <10000 — AB
SPECIAL REQUESTS: NORMAL

## 2017-05-01 ENCOUNTER — Telehealth: Payer: Self-pay | Admitting: Family Medicine

## 2017-05-01 NOTE — Telephone Encounter (Signed)
Called pt reached Lisa Villegas voice mail lmtrc pt needs hospital follow up per [facility]

## 2017-05-05 ENCOUNTER — Inpatient Hospital Stay: Payer: Medicare Other | Admitting: Family Medicine

## 2017-05-12 ENCOUNTER — Emergency Department (HOSPITAL_COMMUNITY)
Admission: EM | Admit: 2017-05-12 | Discharge: 2017-05-12 | Disposition: A | Discharge status: Home / Self Care | Payer: Medicare Other | Attending: Emergency Medicine | Admitting: Emergency Medicine

## 2017-05-12 ENCOUNTER — Encounter (HOSPITAL_COMMUNITY): Payer: Self-pay | Admitting: *Deleted

## 2017-05-12 ENCOUNTER — Telehealth: Payer: Self-pay | Admitting: Family Medicine

## 2017-05-12 ENCOUNTER — Other Ambulatory Visit: Payer: Self-pay

## 2017-05-12 DIAGNOSIS — E1140 Type 2 diabetes mellitus with diabetic neuropathy, unspecified: Secondary | ICD-10-CM | POA: Insufficient documentation

## 2017-05-12 DIAGNOSIS — E1122 Type 2 diabetes mellitus with diabetic chronic kidney disease: Secondary | ICD-10-CM | POA: Insufficient documentation

## 2017-05-12 DIAGNOSIS — N183 Chronic kidney disease, stage 3 (moderate): Secondary | ICD-10-CM | POA: Insufficient documentation

## 2017-05-12 DIAGNOSIS — Z7984 Long term (current) use of oral hypoglycemic drugs: Secondary | ICD-10-CM | POA: Diagnosis not present

## 2017-05-12 DIAGNOSIS — R55 Syncope and collapse: Secondary | ICD-10-CM | POA: Diagnosis not present

## 2017-05-12 DIAGNOSIS — I509 Heart failure, unspecified: Secondary | ICD-10-CM | POA: Insufficient documentation

## 2017-05-12 DIAGNOSIS — Z79899 Other long term (current) drug therapy: Secondary | ICD-10-CM | POA: Diagnosis not present

## 2017-05-12 DIAGNOSIS — R404 Transient alteration of awareness: Secondary | ICD-10-CM | POA: Diagnosis not present

## 2017-05-12 DIAGNOSIS — I130 Hypertensive heart and chronic kidney disease with heart failure and stage 1 through stage 4 chronic kidney disease, or unspecified chronic kidney disease: Secondary | ICD-10-CM | POA: Diagnosis not present

## 2017-05-12 DIAGNOSIS — R197 Diarrhea, unspecified: Secondary | ICD-10-CM | POA: Insufficient documentation

## 2017-05-12 LAB — CBC
HEMATOCRIT: 37 % (ref 36.0–46.0)
HEMOGLOBIN: 11.2 g/dL — AB (ref 12.0–15.0)
MCH: 25.3 pg — ABNORMAL LOW (ref 26.0–34.0)
MCHC: 30.3 g/dL (ref 30.0–36.0)
MCV: 83.7 fL (ref 78.0–100.0)
Platelets: 315 10*3/uL (ref 150–400)
RBC: 4.42 MIL/uL (ref 3.87–5.11)
RDW: 17.2 % — AB (ref 11.5–15.5)
WBC: 9.4 10*3/uL (ref 4.0–10.5)

## 2017-05-12 LAB — SAMPLE TO BLOOD BANK

## 2017-05-12 LAB — BASIC METABOLIC PANEL
ANION GAP: 13 (ref 5–15)
BUN: 26 mg/dL — ABNORMAL HIGH (ref 6–20)
CO2: 21 mmol/L — ABNORMAL LOW (ref 22–32)
Calcium: 9.5 mg/dL (ref 8.9–10.3)
Chloride: 105 mmol/L (ref 101–111)
Creatinine, Ser: 1.13 mg/dL — ABNORMAL HIGH (ref 0.44–1.00)
GFR, EST AFRICAN AMERICAN: 50 mL/min — AB (ref >60)
GFR, EST NON AFRICAN AMERICAN: 43 mL/min — AB (ref >60)
Glucose, Bld: 159 mg/dL — ABNORMAL HIGH (ref 65–99)
POTASSIUM: 4.3 mmol/L (ref 3.5–5.1)
Sodium: 139 mmol/L (ref 135–145)

## 2017-05-12 LAB — I-STAT TROPONIN, ED: Troponin i, poc: 0 ng/mL (ref 0.00–0.08)

## 2017-05-12 LAB — CBG MONITORING, ED: GLUCOSE-CAPILLARY: 153 mg/dL — AB (ref 65–99)

## 2017-05-12 MED ORDER — ERYTHROMYCIN 5 MG/GM OP OINT: TOPICAL_OINTMENT | OPHTHALMIC | 0 refills | Status: DC

## 2017-05-12 MED ORDER — HYDRALAZINE HCL 25 MG PO TABS
25.0000 mg | ORAL_TABLET | Freq: Three times a day (TID) | ORAL | 0 refills | Status: DC | PRN
Start: 2017-05-12 — End: 2017-08-03

## 2017-05-12 MED ORDER — HYDRALAZINE HCL 25 MG PO TABS
25.0000 mg | ORAL_TABLET | Freq: Once | ORAL | Status: AC
  Administered 2017-05-12: 25 mg via ORAL
  Filled 2017-05-12: qty 1

## 2017-05-12 MED ORDER — SODIUM CHLORIDE 0.9 % IV BOLUS (SEPSIS)
500.0000 mL | Freq: Once | INTRAVENOUS | Status: AC
  Administered 2017-05-12: 500 mL via INTRAVENOUS

## 2017-05-12 NOTE — ED Notes (Signed)
Attempted to obtain urine specimen; Pt unable to provide one at this time 

## 2017-05-12 NOTE — ED Triage Notes (Signed)
Patient presents to ed via EMS from [facility], states she went to the bathroom was on the commode and had a syncopal episode , they state she didn't fall off the commode  Just fell against the wall. Upon arrival patient is alert oriented  Denies any complaints at this time.

## 2017-05-12 NOTE — ED Provider Notes (Signed)
[facility] EMERGENCY DEPARTMENT Provider Note   CSN: [id number] Arrival date & time: 05/12/17  1032     History   Chief Complaint Chief Complaint  Patient presents with  . Loss of Consciousness    HPI Lisa Villegas is a 84 y.o. female.  HPI Patient has her power of attorney with her who is very helpful in giving history and past medical history.  Patient at baseline is alert and interactive.  She does however have a number of chronic medical conditions both congenital and acquired.  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  She does hear with hearing aids.  Pedal deformities managed with specialized footwear.  Her POA reports that the patient is interactive and high functioning.  She however will intermittently have bizarre or unusual behaviors and does not have normal response to physical stimulus.  The patient is alert and gives me history.  She reports she did have an episode of diarrhea.  He did not perceive that she had any pain.  Nursing staff reports that she lost consciousness but she doubts it.  No injury.  His POA reports she had seen her earlier this morning and she seemed completely at baseline.  She has not shown clinical signs of being ill recently.  Notably on exam she does have injection and some crusting of the left eye.  Her POA reports this is a chronic intermittent problem leg is treated with ophthalmic ointments.  She sees a ophthalmologist regularly. Past Medical History:  Diagnosis Date  . Anemia   . Anxiety and depression    per med rec  . 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 Autism   . Chronic CHF (congestive heart failure) (HCC)    per med rec  . Chronic kidney disease (CKD)    stage 3 per chart in 09/2015  . Diabetes mellitus without complication ([place])   . Diabetic neuropathy ([place])   . History of thyroid nodule    nontoxic goiter per med rec  . Hodgkin disease ([place])     in 64  . Hodgkin's disease ([place])   . HTN (hypertension), benign   . Hyperlipidemia   . Incontinence   . Lack of sensation   . Osteopenia    per med rec  . Poor balance   . Tubular adenoma of colon 01/2017  . Vitamin D deficiency     Patient Active Problem List   Diagnosis Date Noted  . Anemia 11/30/2016  . Anxiety and depression   . Autism   . History of thyroid nodule   . Hodgkin disease ([place])   . HTN (hypertension), benign   . Incontinence   . Lack of sensation   . Poor balance   . Chronic kidney disease (CKD)   . Osteopenia   . Vitamin D deficiency   . Follow-up examination for injury 02/08/2016  . Diabetes mellitus type 2, controlled, without complications ([place]) 84/66/5993  . Hypertension 01/01/2016  . Incontinence of feces 01/01/2016  . Urinary incontinence without sensory awareness 01/01/2016  . Hyperlipidemia 01/01/2016    Past Surgical History:  Procedure Laterality Date  . ABDOMINAL HYSTERECTOMY    . BREAST LUMPECTOMY Left     OB History    No data available       Home Medications    Prior to Admission medications   Medication Sig Start Date End Date Taking? Authorizing Provider  atorvastatin (LIPITOR) 20 MG tablet Take  1 tablet (20 mg total) by mouth every evening. 10/20/16   Henson, Vickie L, NP-C  calcium carbonate (TUMS EX) 750 MG chewable tablet Chew 1 tablet by mouth daily.    [provider]  Cetylpyridinium Chloride (CREST PRO-HEALTH MT) Use as directed in the mouth or throat.    [provider]  Cholecalciferol (VITAMIN D3) 2000 UNITS TABS Take by mouth daily.    [provider]  erythromycin ophthalmic ointment Place a 1/2 inch ribbon of ointment into the lower eyelid times daily for 5 days. 05/12/17   Charlesetta Shanks, MD  ferrous sulfate 325 (65 FE) MG tablet Take 325 mg by mouth 2 (two) times daily.    [provider]  fesoterodine (TOVIAZ) 8 MG TB24 tablet TAKE 1 TAB BY MOUTH EVERY DAY.  DO NOT CRUSH  OR CHEW . 10/25/16   Denita Lung, MD  hydrALAZINE (APRESOLINE) 25 MG tablet Take 1 tablet (25 mg total) by mouth 3 (three) times daily as needed. Hydralazine 25 mg for systolic blood pressure 027 or greater and diastolic pressure 85 or greater. 05/12/17   Charlesetta Shanks, MD  magnesium chloride (SLOW-MAG) 64 MG TBEC SR tablet Take 1 tablet by mouth 2 (two) times daily.     [provider]  metFORMIN (GLUCOPHAGE) 500 MG tablet Take 500-1,000 mg by mouth 2 (two) times daily. Takes 1 tablet in the morning and 2 tablets at bedtime    [provider]  Multiple Vitamins-Minerals (CERTA-VITE PO) Take by mouth.    [provider]  Nutritional Supplements (GLUCERNA PO) Take by mouth. 1 can every day    [provider]  omeprazole (PRILOSEC) 40 MG capsule Take by mouth 40 mg bid for 3 months,then omeprazole 40 mg every a.m. 02/01/17   [name], MD  Sodium Fluoride (PREVIDENT 5000 BOOSTER PLUS DT) Place onto teeth as directed.    [provider]    Family History Family History  Problem Relation Age of Onset  . CAD Mother        died at 67 yo  . Atrial fibrillation Mother        and tachycardia  . CAD Father        died at 3 yo  . Atrial fibrillation Father        and tachycardia    Social History Social History   Tobacco Use  . Smoking status: Never Smoker  . Smokeless tobacco: Never Used  Substance Use Topics  . Alcohol use: No  . Drug use: No     Allergies   Penicillins   Review of Systems Review of Systems 10 Systems reviewed and are negative for acute change except as noted in the HPI.   Physical Exam Updated Vital Signs BP (!) 166/113   Pulse 80   Resp 20   Ht 5\' 2"  (1.575 m)   Wt 63.5 kg (140 lb)   SpO2 100%   BMI 25.61 kg/m   Physical Exam  Constitutional: She appears well-developed and well-nourished. No distress.  HENT:  Head: Normocephalic and atraumatic.  Mucous membranes pink and moist.  Dentition  poorly maintained but intact.  Eyes: Conjunctivae are normal.  Left eye has mild injection of the lid with some crusting in the lashes.  Mild injection of the sclera.  Normal extraocular motion.  Normal pupillary response.  Neck: Neck supple.  Cardiovascular: Normal rate, regular rhythm, normal heart sounds and intact distal pulses.  No murmur heard. Pulmonary/Chest: Effort normal  and breath sounds normal. No respiratory distress.  Abdominal: Soft. She exhibits no distension. There is no tenderness.  Musculoskeletal: Normal range of motion. She exhibits no edema.  Patient has stable pedal deformity with flattening.  Condition of lower extremities however is very good.  There are no acute deformities or injuries.  She does not have peripheral edema.  No tenderness.  Neurological: She is alert. She exhibits normal muscle tone. Coordination normal.  Patient is cooperative and will assist in sitting forward in the stretcher.  She will assist in moving extremities.  Skin: Skin is warm and dry.  Psychiatric: She has a normal mood and affect.  Nursing note and vitals reviewed.    ED Treatments / Results  Labs (all labs ordered are listed, but only abnormal results are displayed) Labs Reviewed  BASIC METABOLIC PANEL - Abnormal; Notable for the following components:      Result Value   CO2 21 (*)    Glucose, Bld 159 (*)    BUN 26 (*)    Creatinine, Ser 1.13 (*)    GFR calc non Af Amer 43 (*)    GFR calc Af Amer 50 (*)    All other components within normal limits  CBC - Abnormal; Notable for the following components:   Hemoglobin 11.2 (*)    MCH 25.3 (*)    RDW 17.2 (*)    All other components within normal limits  CBG MONITORING, ED - Abnormal; Notable for the following components:   Glucose-Capillary 153 (*)    All other components within normal limits  URINALYSIS, ROUTINE W REFLEX MICROSCOPIC  CBG MONITORING, ED  I-STAT TROPONIN, ED  SAMPLE TO BLOOD BANK    EKG  EKG  Interpretation  Date/Time:  Friday May 12 2017 10:42:13 EST Ventricular Rate:  71 PR Interval:    QRS Duration: 98 QT Interval:  402 QTC Calculation: 437 R Axis:   76 Text Interpretation:  Sinus rhythm Inferior infarct, acute (RCA) Anteroseptal infarct, age indeterminate Probable RV involvement, suggest recording right precordial leads Baseline wander in lead(s) I III aVL no change from previous Confirmed by Charlesetta Shanks [phone]) on 05/12/2017 10:45:51 AM       Radiology No results found.  Procedures Procedures (including critical care time)  Medications Ordered in ED Medications  sodium chloride 0.9 % bolus 500 mL (500 mLs Intravenous New Bag/Given 05/12/17 1240)  hydrALAZINE (APRESOLINE) tablet 25 mg (25 mg Oral Given 05/12/17 1621)     Initial Impression / Assessment and Plan / ED Course  I have reviewed the triage vital signs and the nursing notes.  Pertinent labs & imaging results that were available during my care of the patient were reviewed by me and considered in my medical decision making (see chart for details).     Final Clinical Impressions(s) / ED Diagnoses   Final diagnoses:  Near syncope  Diarrhea, unspecified type   Patient is alert and at baseline.  Diagnostic workup does not show significant laboratory derangement.  No signs of acute infectious illness.  Patient has no pain complaints.  Pressure has been moderately elevated.  She does not appear to be on antihypertensives.  No signs of endorgan damage.  At this time will initiate as needed order for hydralazine.  Will need to be reviewed by her PCP.  By EMR, I do not have the impression that she has been chronically hypertensive she does not appear to be on chronic medication however hypertension is documented in the historical problem  list.  She has had recurrent conjunctivitis.  Will treat with erythromycin. This also Is not a new problem. ED Discharge Orders        Ordered    hydrALAZINE  (APRESOLINE) 25 MG tablet  3 times daily PRN     05/12/17 1619    erythromycin ophthalmic ointment     05/12/17 1620       Charlesetta Shanks, MD 05/12/17 1639

## 2017-05-12 NOTE — Discharge Instructions (Signed)
1.  Monitor the patient's blood pressure twice daily.  Blood pressures are remaining greater than systolic 290 or diastolic 85, administer hydralazine 25 mg as prescribed.  Dr. seen by her family doctor for recheck and monitor

## 2017-05-12 NOTE — Telephone Encounter (Signed)
Error

## 2017-05-12 NOTE — ED Notes (Signed)
Pt given graham crackers and diet coke per Hassan Rowan, Therapist, sports

## 2017-05-12 NOTE — ED Notes (Signed)
MD informed of increased BP, see orders for PO hydralizine

## 2017-05-17 ENCOUNTER — Other Ambulatory Visit: Payer: Self-pay | Admitting: Internal Medicine

## 2017-05-17 ENCOUNTER — Other Ambulatory Visit: Payer: Self-pay | Admitting: Family Medicine

## 2017-05-17 DIAGNOSIS — Z1231 Encounter for screening mammogram for malignant neoplasm of breast: Secondary | ICD-10-CM

## 2017-05-22 DIAGNOSIS — E1122 Type 2 diabetes mellitus with diabetic chronic kidney disease: Secondary | ICD-10-CM | POA: Diagnosis not present

## 2017-05-22 DIAGNOSIS — R001 Bradycardia, unspecified: Secondary | ICD-10-CM | POA: Diagnosis not present

## 2017-05-22 DIAGNOSIS — N183 Chronic kidney disease, stage 3 (moderate): Secondary | ICD-10-CM | POA: Diagnosis not present

## 2017-05-22 DIAGNOSIS — E1142 Type 2 diabetes mellitus with diabetic polyneuropathy: Secondary | ICD-10-CM | POA: Diagnosis not present

## 2017-05-22 DIAGNOSIS — F339 Major depressive disorder, recurrent, unspecified: Secondary | ICD-10-CM | POA: Diagnosis not present

## 2017-05-22 DIAGNOSIS — E785 Hyperlipidemia, unspecified: Secondary | ICD-10-CM | POA: Diagnosis not present

## 2017-05-22 DIAGNOSIS — Z7689 Persons encountering health services in other specified circumstances: Secondary | ICD-10-CM | POA: Diagnosis not present

## 2017-05-22 DIAGNOSIS — I129 Hypertensive chronic kidney disease with stage 1 through stage 4 chronic kidney disease, or unspecified chronic kidney disease: Secondary | ICD-10-CM | POA: Diagnosis not present

## 2017-05-23 DIAGNOSIS — E1322 Other specified diabetes mellitus with diabetic chronic kidney disease: Secondary | ICD-10-CM | POA: Diagnosis not present

## 2017-05-23 DIAGNOSIS — R55 Syncope and collapse: Secondary | ICD-10-CM | POA: Diagnosis not present

## 2017-05-23 DIAGNOSIS — E1365 Other specified diabetes mellitus with hyperglycemia: Secondary | ICD-10-CM | POA: Diagnosis not present

## 2017-05-23 DIAGNOSIS — I951 Orthostatic hypotension: Secondary | ICD-10-CM | POA: Diagnosis not present

## 2017-05-31 ENCOUNTER — Encounter: Payer: Medicare Other | Admitting: Family Medicine

## 2017-06-07 ENCOUNTER — Ambulatory Visit
Admission: RE | Admit: 2017-06-07 | Discharge: 2017-06-07 | Disposition: A | Discharge status: Home / Self Care | Payer: Medicare Other | Source: Ambulatory Visit | Attending: Internal Medicine | Admitting: Internal Medicine

## 2017-06-07 DIAGNOSIS — Z1231 Encounter for screening mammogram for malignant neoplasm of breast: Secondary | ICD-10-CM | POA: Diagnosis not present

## 2017-06-08 DIAGNOSIS — R55 Syncope and collapse: Secondary | ICD-10-CM | POA: Diagnosis not present

## 2017-06-14 DIAGNOSIS — E049 Nontoxic goiter, unspecified: Secondary | ICD-10-CM | POA: Diagnosis not present

## 2017-06-14 DIAGNOSIS — R55 Syncope and collapse: Secondary | ICD-10-CM | POA: Diagnosis not present

## 2017-06-14 DIAGNOSIS — R32 Unspecified urinary incontinence: Secondary | ICD-10-CM | POA: Diagnosis not present

## 2017-06-14 DIAGNOSIS — K219 Gastro-esophageal reflux disease without esophagitis: Secondary | ICD-10-CM | POA: Diagnosis not present

## 2017-06-14 DIAGNOSIS — E875 Hyperkalemia: Secondary | ICD-10-CM | POA: Diagnosis not present

## 2017-06-14 DIAGNOSIS — E785 Hyperlipidemia, unspecified: Secondary | ICD-10-CM | POA: Diagnosis not present

## 2017-06-14 DIAGNOSIS — I129 Hypertensive chronic kidney disease with stage 1 through stage 4 chronic kidney disease, or unspecified chronic kidney disease: Secondary | ICD-10-CM | POA: Diagnosis not present

## 2017-06-14 DIAGNOSIS — N183 Chronic kidney disease, stage 3 (moderate): Secondary | ICD-10-CM | POA: Diagnosis not present

## 2017-06-14 DIAGNOSIS — D649 Anemia, unspecified: Secondary | ICD-10-CM | POA: Diagnosis not present

## 2017-06-14 DIAGNOSIS — E1122 Type 2 diabetes mellitus with diabetic chronic kidney disease: Secondary | ICD-10-CM | POA: Diagnosis not present

## 2017-06-14 DIAGNOSIS — R001 Bradycardia, unspecified: Secondary | ICD-10-CM | POA: Diagnosis not present

## 2017-06-14 DIAGNOSIS — F339 Major depressive disorder, recurrent, unspecified: Secondary | ICD-10-CM | POA: Diagnosis not present

## 2017-06-16 ENCOUNTER — Other Ambulatory Visit: Payer: Self-pay | Admitting: Internal Medicine

## 2017-06-16 DIAGNOSIS — R55 Syncope and collapse: Secondary | ICD-10-CM

## 2017-06-18 DIAGNOSIS — F840 Autistic disorder: Secondary | ICD-10-CM | POA: Diagnosis not present

## 2017-06-18 DIAGNOSIS — E1122 Type 2 diabetes mellitus with diabetic chronic kidney disease: Secondary | ICD-10-CM | POA: Diagnosis not present

## 2017-06-18 DIAGNOSIS — E1142 Type 2 diabetes mellitus with diabetic polyneuropathy: Secondary | ICD-10-CM | POA: Diagnosis not present

## 2017-06-18 DIAGNOSIS — N183 Chronic kidney disease, stage 3 (moderate): Secondary | ICD-10-CM | POA: Diagnosis not present

## 2017-06-18 DIAGNOSIS — I129 Hypertensive chronic kidney disease with stage 1 through stage 4 chronic kidney disease, or unspecified chronic kidney disease: Secondary | ICD-10-CM | POA: Diagnosis not present

## 2017-06-18 DIAGNOSIS — R55 Syncope and collapse: Secondary | ICD-10-CM | POA: Diagnosis not present

## 2017-06-19 DIAGNOSIS — R55 Syncope and collapse: Secondary | ICD-10-CM | POA: Diagnosis not present

## 2017-06-19 DIAGNOSIS — I129 Hypertensive chronic kidney disease with stage 1 through stage 4 chronic kidney disease, or unspecified chronic kidney disease: Secondary | ICD-10-CM | POA: Diagnosis not present

## 2017-06-19 DIAGNOSIS — E1122 Type 2 diabetes mellitus with diabetic chronic kidney disease: Secondary | ICD-10-CM | POA: Diagnosis not present

## 2017-06-19 DIAGNOSIS — E1142 Type 2 diabetes mellitus with diabetic polyneuropathy: Secondary | ICD-10-CM | POA: Diagnosis not present

## 2017-06-19 DIAGNOSIS — N183 Chronic kidney disease, stage 3 (moderate): Secondary | ICD-10-CM | POA: Diagnosis not present

## 2017-06-19 DIAGNOSIS — F840 Autistic disorder: Secondary | ICD-10-CM | POA: Diagnosis not present

## 2017-06-22 ENCOUNTER — Other Ambulatory Visit: Payer: Medicare Other

## 2017-06-22 DIAGNOSIS — F840 Autistic disorder: Secondary | ICD-10-CM | POA: Diagnosis not present

## 2017-06-22 DIAGNOSIS — E1142 Type 2 diabetes mellitus with diabetic polyneuropathy: Secondary | ICD-10-CM | POA: Diagnosis not present

## 2017-06-22 DIAGNOSIS — R55 Syncope and collapse: Secondary | ICD-10-CM | POA: Diagnosis not present

## 2017-06-22 DIAGNOSIS — N183 Chronic kidney disease, stage 3 (moderate): Secondary | ICD-10-CM | POA: Diagnosis not present

## 2017-06-22 DIAGNOSIS — E1122 Type 2 diabetes mellitus with diabetic chronic kidney disease: Secondary | ICD-10-CM | POA: Diagnosis not present

## 2017-06-22 DIAGNOSIS — I129 Hypertensive chronic kidney disease with stage 1 through stage 4 chronic kidney disease, or unspecified chronic kidney disease: Secondary | ICD-10-CM | POA: Diagnosis not present

## 2017-06-26 DIAGNOSIS — R55 Syncope and collapse: Secondary | ICD-10-CM | POA: Diagnosis not present

## 2017-06-26 DIAGNOSIS — I129 Hypertensive chronic kidney disease with stage 1 through stage 4 chronic kidney disease, or unspecified chronic kidney disease: Secondary | ICD-10-CM | POA: Diagnosis not present

## 2017-06-26 DIAGNOSIS — E1142 Type 2 diabetes mellitus with diabetic polyneuropathy: Secondary | ICD-10-CM | POA: Diagnosis not present

## 2017-06-26 DIAGNOSIS — Z1283 Encounter for screening for malignant neoplasm of skin: Secondary | ICD-10-CM | POA: Diagnosis not present

## 2017-06-26 DIAGNOSIS — L821 Other seborrheic keratosis: Secondary | ICD-10-CM | POA: Diagnosis not present

## 2017-06-26 DIAGNOSIS — F840 Autistic disorder: Secondary | ICD-10-CM | POA: Diagnosis not present

## 2017-06-26 DIAGNOSIS — N183 Chronic kidney disease, stage 3 (moderate): Secondary | ICD-10-CM | POA: Diagnosis not present

## 2017-06-26 DIAGNOSIS — E1122 Type 2 diabetes mellitus with diabetic chronic kidney disease: Secondary | ICD-10-CM | POA: Diagnosis not present

## 2017-06-27 ENCOUNTER — Encounter: Payer: Self-pay | Admitting: Gastroenterology

## 2017-06-29 DIAGNOSIS — I129 Hypertensive chronic kidney disease with stage 1 through stage 4 chronic kidney disease, or unspecified chronic kidney disease: Secondary | ICD-10-CM | POA: Diagnosis not present

## 2017-06-29 DIAGNOSIS — E1122 Type 2 diabetes mellitus with diabetic chronic kidney disease: Secondary | ICD-10-CM | POA: Diagnosis not present

## 2017-06-29 DIAGNOSIS — E1142 Type 2 diabetes mellitus with diabetic polyneuropathy: Secondary | ICD-10-CM | POA: Diagnosis not present

## 2017-06-29 DIAGNOSIS — F840 Autistic disorder: Secondary | ICD-10-CM | POA: Diagnosis not present

## 2017-06-29 DIAGNOSIS — N183 Chronic kidney disease, stage 3 (moderate): Secondary | ICD-10-CM | POA: Diagnosis not present

## 2017-06-29 DIAGNOSIS — R55 Syncope and collapse: Secondary | ICD-10-CM | POA: Diagnosis not present

## 2017-06-30 DIAGNOSIS — R531 Weakness: Secondary | ICD-10-CM | POA: Diagnosis not present

## 2017-06-30 DIAGNOSIS — D509 Iron deficiency anemia, unspecified: Secondary | ICD-10-CM | POA: Diagnosis not present

## 2017-06-30 DIAGNOSIS — N183 Chronic kidney disease, stage 3 (moderate): Secondary | ICD-10-CM | POA: Diagnosis not present

## 2017-06-30 DIAGNOSIS — E559 Vitamin D deficiency, unspecified: Secondary | ICD-10-CM | POA: Diagnosis not present

## 2017-06-30 DIAGNOSIS — E049 Nontoxic goiter, unspecified: Secondary | ICD-10-CM | POA: Diagnosis not present

## 2017-06-30 DIAGNOSIS — E1142 Type 2 diabetes mellitus with diabetic polyneuropathy: Secondary | ICD-10-CM | POA: Diagnosis not present

## 2017-06-30 DIAGNOSIS — I129 Hypertensive chronic kidney disease with stage 1 through stage 4 chronic kidney disease, or unspecified chronic kidney disease: Secondary | ICD-10-CM | POA: Diagnosis not present

## 2017-06-30 DIAGNOSIS — N182 Chronic kidney disease, stage 2 (mild): Secondary | ICD-10-CM | POA: Diagnosis not present

## 2017-06-30 DIAGNOSIS — E1122 Type 2 diabetes mellitus with diabetic chronic kidney disease: Secondary | ICD-10-CM | POA: Diagnosis not present

## 2017-06-30 DIAGNOSIS — I10 Essential (primary) hypertension: Secondary | ICD-10-CM | POA: Diagnosis not present

## 2017-06-30 DIAGNOSIS — Z8571 Personal history of Hodgkin lymphoma: Secondary | ICD-10-CM | POA: Diagnosis not present

## 2017-07-03 DIAGNOSIS — R55 Syncope and collapse: Secondary | ICD-10-CM | POA: Diagnosis not present

## 2017-07-03 DIAGNOSIS — E1122 Type 2 diabetes mellitus with diabetic chronic kidney disease: Secondary | ICD-10-CM | POA: Diagnosis not present

## 2017-07-03 DIAGNOSIS — I129 Hypertensive chronic kidney disease with stage 1 through stage 4 chronic kidney disease, or unspecified chronic kidney disease: Secondary | ICD-10-CM | POA: Diagnosis not present

## 2017-07-03 DIAGNOSIS — F840 Autistic disorder: Secondary | ICD-10-CM | POA: Diagnosis not present

## 2017-07-03 DIAGNOSIS — E1142 Type 2 diabetes mellitus with diabetic polyneuropathy: Secondary | ICD-10-CM | POA: Diagnosis not present

## 2017-07-03 DIAGNOSIS — N183 Chronic kidney disease, stage 3 (moderate): Secondary | ICD-10-CM | POA: Diagnosis not present

## 2017-07-04 DIAGNOSIS — N183 Chronic kidney disease, stage 3 (moderate): Secondary | ICD-10-CM | POA: Diagnosis not present

## 2017-07-04 DIAGNOSIS — F840 Autistic disorder: Secondary | ICD-10-CM | POA: Diagnosis not present

## 2017-07-04 DIAGNOSIS — I129 Hypertensive chronic kidney disease with stage 1 through stage 4 chronic kidney disease, or unspecified chronic kidney disease: Secondary | ICD-10-CM | POA: Diagnosis not present

## 2017-07-04 DIAGNOSIS — E1122 Type 2 diabetes mellitus with diabetic chronic kidney disease: Secondary | ICD-10-CM | POA: Diagnosis not present

## 2017-07-04 DIAGNOSIS — E1142 Type 2 diabetes mellitus with diabetic polyneuropathy: Secondary | ICD-10-CM | POA: Diagnosis not present

## 2017-07-04 DIAGNOSIS — R55 Syncope and collapse: Secondary | ICD-10-CM | POA: Diagnosis not present

## 2017-07-06 DIAGNOSIS — N183 Chronic kidney disease, stage 3 (moderate): Secondary | ICD-10-CM | POA: Diagnosis not present

## 2017-07-06 DIAGNOSIS — E1142 Type 2 diabetes mellitus with diabetic polyneuropathy: Secondary | ICD-10-CM | POA: Diagnosis not present

## 2017-07-06 DIAGNOSIS — F840 Autistic disorder: Secondary | ICD-10-CM | POA: Diagnosis not present

## 2017-07-06 DIAGNOSIS — R55 Syncope and collapse: Secondary | ICD-10-CM | POA: Diagnosis not present

## 2017-07-06 DIAGNOSIS — E1122 Type 2 diabetes mellitus with diabetic chronic kidney disease: Secondary | ICD-10-CM | POA: Diagnosis not present

## 2017-07-06 DIAGNOSIS — I129 Hypertensive chronic kidney disease with stage 1 through stage 4 chronic kidney disease, or unspecified chronic kidney disease: Secondary | ICD-10-CM | POA: Diagnosis not present

## 2017-07-07 DIAGNOSIS — R001 Bradycardia, unspecified: Secondary | ICD-10-CM | POA: Diagnosis not present

## 2017-07-07 DIAGNOSIS — D649 Anemia, unspecified: Secondary | ICD-10-CM | POA: Diagnosis not present

## 2017-07-07 DIAGNOSIS — N182 Chronic kidney disease, stage 2 (mild): Secondary | ICD-10-CM | POA: Diagnosis not present

## 2017-07-07 DIAGNOSIS — E049 Nontoxic goiter, unspecified: Secondary | ICD-10-CM | POA: Diagnosis not present

## 2017-07-07 DIAGNOSIS — M8580 Other specified disorders of bone density and structure, unspecified site: Secondary | ICD-10-CM | POA: Diagnosis not present

## 2017-07-07 DIAGNOSIS — E1142 Type 2 diabetes mellitus with diabetic polyneuropathy: Secondary | ICD-10-CM | POA: Diagnosis not present

## 2017-07-07 DIAGNOSIS — E785 Hyperlipidemia, unspecified: Secondary | ICD-10-CM | POA: Diagnosis not present

## 2017-07-07 DIAGNOSIS — K219 Gastro-esophageal reflux disease without esophagitis: Secondary | ICD-10-CM | POA: Diagnosis not present

## 2017-07-07 DIAGNOSIS — R32 Unspecified urinary incontinence: Secondary | ICD-10-CM | POA: Diagnosis not present

## 2017-07-07 DIAGNOSIS — F339 Major depressive disorder, recurrent, unspecified: Secondary | ICD-10-CM | POA: Diagnosis not present

## 2017-07-07 DIAGNOSIS — E1122 Type 2 diabetes mellitus with diabetic chronic kidney disease: Secondary | ICD-10-CM | POA: Diagnosis not present

## 2017-07-07 DIAGNOSIS — I129 Hypertensive chronic kidney disease with stage 1 through stage 4 chronic kidney disease, or unspecified chronic kidney disease: Secondary | ICD-10-CM | POA: Diagnosis not present

## 2017-07-10 DIAGNOSIS — E1122 Type 2 diabetes mellitus with diabetic chronic kidney disease: Secondary | ICD-10-CM | POA: Diagnosis not present

## 2017-07-10 DIAGNOSIS — I129 Hypertensive chronic kidney disease with stage 1 through stage 4 chronic kidney disease, or unspecified chronic kidney disease: Secondary | ICD-10-CM | POA: Diagnosis not present

## 2017-07-10 DIAGNOSIS — F840 Autistic disorder: Secondary | ICD-10-CM | POA: Diagnosis not present

## 2017-07-10 DIAGNOSIS — R55 Syncope and collapse: Secondary | ICD-10-CM | POA: Diagnosis not present

## 2017-07-10 DIAGNOSIS — N183 Chronic kidney disease, stage 3 (moderate): Secondary | ICD-10-CM | POA: Diagnosis not present

## 2017-07-10 DIAGNOSIS — E1142 Type 2 diabetes mellitus with diabetic polyneuropathy: Secondary | ICD-10-CM | POA: Diagnosis not present

## 2017-07-11 DIAGNOSIS — R55 Syncope and collapse: Secondary | ICD-10-CM | POA: Diagnosis not present

## 2017-07-11 DIAGNOSIS — E1122 Type 2 diabetes mellitus with diabetic chronic kidney disease: Secondary | ICD-10-CM | POA: Diagnosis not present

## 2017-07-11 DIAGNOSIS — F840 Autistic disorder: Secondary | ICD-10-CM | POA: Diagnosis not present

## 2017-07-11 DIAGNOSIS — E1142 Type 2 diabetes mellitus with diabetic polyneuropathy: Secondary | ICD-10-CM | POA: Diagnosis not present

## 2017-07-11 DIAGNOSIS — N183 Chronic kidney disease, stage 3 (moderate): Secondary | ICD-10-CM | POA: Diagnosis not present

## 2017-07-11 DIAGNOSIS — I129 Hypertensive chronic kidney disease with stage 1 through stage 4 chronic kidney disease, or unspecified chronic kidney disease: Secondary | ICD-10-CM | POA: Diagnosis not present

## 2017-07-13 DIAGNOSIS — I129 Hypertensive chronic kidney disease with stage 1 through stage 4 chronic kidney disease, or unspecified chronic kidney disease: Secondary | ICD-10-CM | POA: Diagnosis not present

## 2017-07-13 DIAGNOSIS — N183 Chronic kidney disease, stage 3 (moderate): Secondary | ICD-10-CM | POA: Diagnosis not present

## 2017-07-13 DIAGNOSIS — R55 Syncope and collapse: Secondary | ICD-10-CM | POA: Diagnosis not present

## 2017-07-13 DIAGNOSIS — F840 Autistic disorder: Secondary | ICD-10-CM | POA: Diagnosis not present

## 2017-07-13 DIAGNOSIS — E1142 Type 2 diabetes mellitus with diabetic polyneuropathy: Secondary | ICD-10-CM | POA: Diagnosis not present

## 2017-07-13 DIAGNOSIS — E1122 Type 2 diabetes mellitus with diabetic chronic kidney disease: Secondary | ICD-10-CM | POA: Diagnosis not present

## 2017-07-18 DIAGNOSIS — E1122 Type 2 diabetes mellitus with diabetic chronic kidney disease: Secondary | ICD-10-CM | POA: Diagnosis not present

## 2017-07-18 DIAGNOSIS — N183 Chronic kidney disease, stage 3 (moderate): Secondary | ICD-10-CM | POA: Diagnosis not present

## 2017-07-18 DIAGNOSIS — F840 Autistic disorder: Secondary | ICD-10-CM | POA: Diagnosis not present

## 2017-07-18 DIAGNOSIS — E1142 Type 2 diabetes mellitus with diabetic polyneuropathy: Secondary | ICD-10-CM | POA: Diagnosis not present

## 2017-07-18 DIAGNOSIS — I129 Hypertensive chronic kidney disease with stage 1 through stage 4 chronic kidney disease, or unspecified chronic kidney disease: Secondary | ICD-10-CM | POA: Diagnosis not present

## 2017-07-18 DIAGNOSIS — R55 Syncope and collapse: Secondary | ICD-10-CM | POA: Diagnosis not present

## 2017-07-25 ENCOUNTER — Observation Stay (HOSPITAL_COMMUNITY)
Admission: EM | Admit: 2017-07-25 | Discharge: 2017-07-26 | Disposition: A | Discharge status: Home / Self Care | Payer: Medicare Other | Attending: Family Medicine | Admitting: Family Medicine

## 2017-07-25 ENCOUNTER — Observation Stay (HOSPITAL_COMMUNITY): Payer: Medicare Other

## 2017-07-25 ENCOUNTER — Ambulatory Visit: Payer: Medicare Other | Admitting: Podiatry

## 2017-07-25 ENCOUNTER — Encounter (HOSPITAL_COMMUNITY): Payer: Self-pay | Admitting: Emergency Medicine

## 2017-07-25 ENCOUNTER — Emergency Department (HOSPITAL_COMMUNITY): Payer: Medicare Other

## 2017-07-25 DIAGNOSIS — E1122 Type 2 diabetes mellitus with diabetic chronic kidney disease: Secondary | ICD-10-CM | POA: Insufficient documentation

## 2017-07-25 DIAGNOSIS — R251 Tremor, unspecified: Secondary | ICD-10-CM | POA: Diagnosis not present

## 2017-07-25 DIAGNOSIS — R4182 Altered mental status, unspecified: Secondary | ICD-10-CM | POA: Diagnosis not present

## 2017-07-25 DIAGNOSIS — E875 Hyperkalemia: Secondary | ICD-10-CM | POA: Diagnosis not present

## 2017-07-25 DIAGNOSIS — E785 Hyperlipidemia, unspecified: Secondary | ICD-10-CM | POA: Diagnosis not present

## 2017-07-25 DIAGNOSIS — K219 Gastro-esophageal reflux disease without esophagitis: Secondary | ICD-10-CM | POA: Diagnosis not present

## 2017-07-25 DIAGNOSIS — R531 Weakness: Secondary | ICD-10-CM | POA: Diagnosis not present

## 2017-07-25 DIAGNOSIS — G9389 Other specified disorders of brain: Secondary | ICD-10-CM | POA: Diagnosis not present

## 2017-07-25 DIAGNOSIS — G9341 Metabolic encephalopathy: Secondary | ICD-10-CM | POA: Diagnosis not present

## 2017-07-25 DIAGNOSIS — I129 Hypertensive chronic kidney disease with stage 1 through stage 4 chronic kidney disease, or unspecified chronic kidney disease: Secondary | ICD-10-CM | POA: Diagnosis not present

## 2017-07-25 DIAGNOSIS — E1143 Type 2 diabetes mellitus with diabetic autonomic (poly)neuropathy: Secondary | ICD-10-CM | POA: Diagnosis not present

## 2017-07-25 DIAGNOSIS — Z79899 Other long term (current) drug therapy: Secondary | ICD-10-CM | POA: Insufficient documentation

## 2017-07-25 DIAGNOSIS — R9431 Abnormal electrocardiogram [ECG] [EKG]: Secondary | ICD-10-CM | POA: Diagnosis not present

## 2017-07-25 DIAGNOSIS — D508 Other iron deficiency anemias: Secondary | ICD-10-CM

## 2017-07-25 DIAGNOSIS — R4789 Other speech disturbances: Secondary | ICD-10-CM | POA: Insufficient documentation

## 2017-07-25 DIAGNOSIS — I10 Essential (primary) hypertension: Secondary | ICD-10-CM | POA: Diagnosis not present

## 2017-07-25 DIAGNOSIS — R55 Syncope and collapse: Secondary | ICD-10-CM | POA: Diagnosis not present

## 2017-07-25 DIAGNOSIS — D649 Anemia, unspecified: Secondary | ICD-10-CM | POA: Diagnosis present

## 2017-07-25 DIAGNOSIS — Z8571 Personal history of Hodgkin lymphoma: Secondary | ICD-10-CM | POA: Diagnosis not present

## 2017-07-25 DIAGNOSIS — Z66 Do not resuscitate: Secondary | ICD-10-CM | POA: Diagnosis not present

## 2017-07-25 DIAGNOSIS — I509 Heart failure, unspecified: Secondary | ICD-10-CM | POA: Diagnosis not present

## 2017-07-25 DIAGNOSIS — R29898 Other symptoms and signs involving the musculoskeletal system: Secondary | ICD-10-CM

## 2017-07-25 DIAGNOSIS — E1140 Type 2 diabetes mellitus with diabetic neuropathy, unspecified: Secondary | ICD-10-CM | POA: Insufficient documentation

## 2017-07-25 DIAGNOSIS — G459 Transient cerebral ischemic attack, unspecified: Secondary | ICD-10-CM | POA: Diagnosis not present

## 2017-07-25 DIAGNOSIS — E1129 Type 2 diabetes mellitus with other diabetic kidney complication: Secondary | ICD-10-CM | POA: Diagnosis present

## 2017-07-25 DIAGNOSIS — I160 Hypertensive urgency: Secondary | ICD-10-CM | POA: Diagnosis present

## 2017-07-25 DIAGNOSIS — F840 Autistic disorder: Secondary | ICD-10-CM | POA: Diagnosis not present

## 2017-07-25 DIAGNOSIS — I130 Hypertensive heart and chronic kidney disease with heart failure and stage 1 through stage 4 chronic kidney disease, or unspecified chronic kidney disease: Secondary | ICD-10-CM | POA: Insufficient documentation

## 2017-07-25 DIAGNOSIS — N183 Chronic kidney disease, stage 3 unspecified: Secondary | ICD-10-CM | POA: Diagnosis present

## 2017-07-25 DIAGNOSIS — I493 Ventricular premature depolarization: Secondary | ICD-10-CM | POA: Diagnosis not present

## 2017-07-25 DIAGNOSIS — M6281 Muscle weakness (generalized): Secondary | ICD-10-CM | POA: Diagnosis not present

## 2017-07-25 DIAGNOSIS — I951 Orthostatic hypotension: Secondary | ICD-10-CM | POA: Diagnosis not present

## 2017-07-25 LAB — URINALYSIS, ROUTINE W REFLEX MICROSCOPIC
BILIRUBIN URINE: NEGATIVE
Bacteria, UA: NONE SEEN
Glucose, UA: 50 mg/dL — AB
Hgb urine dipstick: NEGATIVE
Ketones, ur: 5 mg/dL — AB
LEUKOCYTES UA: NEGATIVE
Nitrite: NEGATIVE
PH: 5 (ref 5.0–8.0)
Protein, ur: 100 mg/dL — AB
SPECIFIC GRAVITY, URINE: 1.024 (ref 1.005–1.030)

## 2017-07-25 LAB — CBC
HCT: 39 % (ref 36.0–46.0)
HEMOGLOBIN: 12.5 g/dL (ref 12.0–15.0)
MCH: 26 pg (ref 26.0–34.0)
MCHC: 32.1 g/dL (ref 30.0–36.0)
MCV: 81.1 fL (ref 78.0–100.0)
Platelets: 290 10*3/uL (ref 150–400)
RBC: 4.81 MIL/uL (ref 3.87–5.11)
RDW: 16.5 % — AB (ref 11.5–15.5)
WBC: 11.4 10*3/uL — AB (ref 4.0–10.5)

## 2017-07-25 LAB — DIFFERENTIAL
BASOS ABS: 0 10*3/uL (ref 0.0–0.1)
BASOS PCT: 0 %
Eosinophils Absolute: 0 10*3/uL (ref 0.0–0.7)
Eosinophils Relative: 0 %
LYMPHS ABS: 1.1 10*3/uL (ref 0.7–4.0)
Lymphocytes Relative: 10 %
Monocytes Absolute: 0.6 10*3/uL (ref 0.1–1.0)
Monocytes Relative: 5 %
NEUTROS ABS: 9.7 10*3/uL — AB (ref 1.7–7.7)
NEUTROS PCT: 85 %

## 2017-07-25 LAB — I-STAT CHEM 8, ED
BUN: 31 mg/dL — ABNORMAL HIGH (ref 6–20)
Calcium, Ion: 1.13 mmol/L — ABNORMAL LOW (ref 1.15–1.40)
Chloride: 102 mmol/L (ref 101–111)
Creatinine, Ser: 1.1 mg/dL — ABNORMAL HIGH (ref 0.44–1.00)
GLUCOSE: 242 mg/dL — AB (ref 65–99)
HEMATOCRIT: 40 % (ref 36.0–46.0)
HEMOGLOBIN: 13.6 g/dL (ref 12.0–15.0)
POTASSIUM: 5.4 mmol/L — AB (ref 3.5–5.1)
SODIUM: 135 mmol/L (ref 135–145)
TCO2: 19 mmol/L — ABNORMAL LOW (ref 22–32)

## 2017-07-25 LAB — COMPREHENSIVE METABOLIC PANEL
ALBUMIN: 3.9 g/dL (ref 3.5–5.0)
ALT: 22 U/L (ref 14–54)
AST: 26 U/L (ref 15–41)
Alkaline Phosphatase: 71 U/L (ref 38–126)
Anion gap: 13 (ref 5–15)
BUN: 32 mg/dL — AB (ref 6–20)
CHLORIDE: 100 mmol/L — AB (ref 101–111)
CO2: 20 mmol/L — ABNORMAL LOW (ref 22–32)
Calcium: 9.9 mg/dL (ref 8.9–10.3)
Creatinine, Ser: 1.22 mg/dL — ABNORMAL HIGH (ref 0.44–1.00)
GFR calc Af Amer: 46 mL/min — ABNORMAL LOW (ref >60)
GFR calc non Af Amer: 40 mL/min — ABNORMAL LOW (ref >60)
GLUCOSE: 239 mg/dL — AB (ref 65–99)
Potassium: 5.5 mmol/L — ABNORMAL HIGH (ref 3.5–5.1)
Sodium: 133 mmol/L — ABNORMAL LOW (ref 135–145)
Total Bilirubin: 0.6 mg/dL (ref 0.3–1.2)
Total Protein: 7.7 g/dL (ref 6.5–8.1)

## 2017-07-25 LAB — CBG MONITORING, ED: Glucose-Capillary: 239 mg/dL — ABNORMAL HIGH (ref 65–99)

## 2017-07-25 LAB — PROTIME-INR
INR: 1.04
Prothrombin Time: 13.5 seconds (ref 11.4–15.2)

## 2017-07-25 LAB — APTT: APTT: 25 s (ref 24–36)

## 2017-07-25 LAB — I-STAT TROPONIN, ED: Troponin i, poc: 0.01 ng/mL (ref 0.00–0.08)

## 2017-07-25 MED ORDER — GLUCERNA SHAKE PO LIQD: 237.0000 mL | Freq: Every day | ORAL | Status: DC

## 2017-07-25 MED ORDER — ASPIRIN 300 MG RE SUPP: 300.0000 mg | Freq: Every day | RECTAL | Status: DC

## 2017-07-25 MED ORDER — ENOXAPARIN SODIUM 40 MG/0.4ML ~~LOC~~ SOLN
40.0000 mg | SUBCUTANEOUS | Status: DC
  Administered 2017-07-26: 40 mg via SUBCUTANEOUS
  Filled 2017-07-25: qty 0.4

## 2017-07-25 MED ORDER — SENNOSIDES-DOCUSATE SODIUM 8.6-50 MG PO TABS: 1.0000 | ORAL_TABLET | Freq: Every evening | ORAL | Status: DC | PRN

## 2017-07-25 MED ORDER — VITAMIN D 1000 UNITS PO TABS
2000.0000 [IU] | ORAL_TABLET | Freq: Every day | ORAL | Status: DC
  Administered 2017-07-26: 2000 [IU] via ORAL
  Filled 2017-07-25: qty 2

## 2017-07-25 MED ORDER — ONDANSETRON HCL 4 MG/2ML IJ SOLN: 4.0000 mg | Freq: Three times a day (TID) | INTRAMUSCULAR | Status: DC | PRN

## 2017-07-25 MED ORDER — SODIUM CHLORIDE 0.9 % IV SOLN
INTRAVENOUS | Status: DC
  Administered 2017-07-26 (×2): via INTRAVENOUS

## 2017-07-25 MED ORDER — ADULT MULTIVITAMIN LIQUID CH
15.0000 mL | Freq: Every day | ORAL | Status: DC
  Administered 2017-07-26: 15 mL via ORAL
  Filled 2017-07-25: qty 15

## 2017-07-25 MED ORDER — ATORVASTATIN CALCIUM 10 MG PO TABS
20.0000 mg | ORAL_TABLET | Freq: Every evening | ORAL | Status: DC
  Administered 2017-07-26: 20 mg via ORAL
  Filled 2017-07-25: qty 2

## 2017-07-25 MED ORDER — SODIUM CHLORIDE 0.9 % IV SOLN: 500.0000 mL | INTRAVENOUS | Status: DC

## 2017-07-25 MED ORDER — PANTOPRAZOLE SODIUM 40 MG PO TBEC
40.0000 mg | DELAYED_RELEASE_TABLET | Freq: Every day | ORAL | Status: DC
  Administered 2017-07-26: 40 mg via ORAL
  Filled 2017-07-25: qty 1

## 2017-07-25 MED ORDER — ACETAMINOPHEN 160 MG/5ML PO SOLN: 650.0000 mg | ORAL | Status: DC | PRN

## 2017-07-25 MED ORDER — HYDRALAZINE HCL 25 MG PO TABS
25.0000 mg | ORAL_TABLET | Freq: Three times a day (TID) | ORAL | Status: DC
  Administered 2017-07-26 (×3): 25 mg via ORAL
  Filled 2017-07-25 (×3): qty 1

## 2017-07-25 MED ORDER — MAGNESIUM CHLORIDE 64 MG PO TBEC
1.0000 | DELAYED_RELEASE_TABLET | Freq: Two times a day (BID) | ORAL | Status: DC
  Administered 2017-07-26 (×2): 64 mg via ORAL
  Filled 2017-07-25 (×2): qty 1

## 2017-07-25 MED ORDER — CALCIUM CARBONATE ANTACID 500 MG PO CHEW
2.0000 | CHEWABLE_TABLET | Freq: Every day | ORAL | Status: DC
  Administered 2017-07-26: 400 mg via ORAL
  Filled 2017-07-25: qty 2

## 2017-07-25 MED ORDER — STROKE: EARLY STAGES OF RECOVERY BOOK
Freq: Once | Status: AC
  Administered 2017-07-26: 04:00:00
  Filled 2017-07-25 (×2): qty 1

## 2017-07-25 MED ORDER — FERROUS SULFATE 325 (65 FE) MG PO TABS
325.0000 mg | ORAL_TABLET | Freq: Two times a day (BID) | ORAL | Status: DC
  Administered 2017-07-26 (×2): 325 mg via ORAL
  Filled 2017-07-25 (×2): qty 1

## 2017-07-25 MED ORDER — SODIUM POLYSTYRENE SULFONATE 15 GM/60ML PO SUSP
15.0000 g | Freq: Once | ORAL | Status: AC
  Administered 2017-07-26: 15 g via ORAL
  Filled 2017-07-25: qty 60

## 2017-07-25 MED ORDER — INSULIN ASPART 100 UNIT/ML ~~LOC~~ SOLN: 0.0000 [IU] | Freq: Every day | SUBCUTANEOUS | Status: DC

## 2017-07-25 MED ORDER — ASPIRIN 325 MG PO TABS
325.0000 mg | ORAL_TABLET | Freq: Every day | ORAL | Status: DC
  Administered 2017-07-26: 325 mg via ORAL
  Filled 2017-07-25: qty 1

## 2017-07-25 MED ORDER — ZOLPIDEM TARTRATE 5 MG PO TABS: 5.0000 mg | ORAL_TABLET | Freq: Every evening | ORAL | Status: DC | PRN

## 2017-07-25 MED ORDER — ACETAMINOPHEN 650 MG RE SUPP: 650.0000 mg | RECTAL | Status: DC | PRN

## 2017-07-25 MED ORDER — HYDRALAZINE HCL 20 MG/ML IJ SOLN: 5.0000 mg | INTRAMUSCULAR | Status: DC | PRN

## 2017-07-25 MED ORDER — ACETAMINOPHEN 325 MG PO TABS: 650.0000 mg | ORAL_TABLET | ORAL | Status: DC | PRN

## 2017-07-25 MED ORDER — FESOTERODINE FUMARATE ER 8 MG PO TB24
8.0000 mg | ORAL_TABLET | Freq: Every day | ORAL | Status: DC
  Administered 2017-07-26: 8 mg via ORAL
  Filled 2017-07-25: qty 1

## 2017-07-25 MED ORDER — INSULIN ASPART 100 UNIT/ML ~~LOC~~ SOLN
0.0000 [IU] | Freq: Three times a day (TID) | SUBCUTANEOUS | Status: DC
  Administered 2017-07-26: 1 [IU] via SUBCUTANEOUS
  Administered 2017-07-26: 2 [IU] via SUBCUTANEOUS

## 2017-07-25 NOTE — ED Provider Notes (Signed)
Patient placed in [facility] pathway, seen and evaluated   Chief Complaint: AMS  HPI:   84 y.o. M with PMH/o diabetes, hypertension, autism, who presents for evaluation of altered mental status, neuro deficits.  Patient currently lives full-time at a nursing facility.  Patient's POA came to visit her today and staff told her that patient's blood pressure was elevated yesterday.  Additionally, today systolic blood pressure was 190.  Nursing staff reported decreased appetite.  POA states that when she evaluated patient, she states that patient was acting inappropriate.  She states that she would not follow commands, answer simple questions which she states is different from her baseline.  Additionally, POA noticed some tremor, difficulties ambulating.  POA felt like patient was having difficulty using her left upper extremity.  POA reports that at baseline, patient is able to ambulate without any difficulty, assistance.  She reports that since picking her up today, she has noticed difficulty ambulating.  POA states patient does not have a history of high blood pressure, though review of her chart show that she was started on hydralazine in February 2019.   ROS: AMS, UNABLE TO ROS  Physical Exam:   Gen: No distress  Neuro: Awake and Alert  Skin: Warm    Focused Exam:   Alert and oriented to person and time.  Unable to tell me place.    Slight facial droop noted of left eye only but face appears symmetric when evaluating cranial nerves.  Speech  appears intact.  Follows commands, Moves all extremities   5/5 strength to BUE and BLE   Sensation intact throughout all major nerve distributions  Tremulous finger to nose   No pronator drift.  Unable to assess gait.    POA states she is unsure of how how long symptoms have been going on.  She reports that she has been out of town and has not visited patient.  She reports that she not did not receive any phone call about patient's symptoms over the last  24 hours.  She states she only notices symptoms when she went to visit her today.  Given unknown last time of normal, patient does not meet criteria for code stroke.  Initiation of care has begun. The patient has been counseled on the process, plan, and necessity for staying for the completion/evaluation, and the remainder of the medical screening examination    Desma Mcgregor 01/60/10 [id number]    Delora Fuel, MD 55/73/22 2300

## 2017-07-25 NOTE — ED Triage Notes (Signed)
Per POA, Pt from [facility]. POA reports BP 190/100 this morning, they gave something to decrease BP. POA reports LKW 1.5 weeks ago. POA reports pt unable to walk, normally able to walk. Pt answers questions inappropriately and follow commands appropriately. Pt seen at cardiologist today, prescribed verapamil. Patient has slight drift to RLE and mild aphasia.

## 2017-07-25 NOTE — ED Notes (Signed)
Delay in lab draw,  Pt not in room at this time. 

## 2017-07-25 NOTE — H&P (Addendum)
History and Physical    Lisa Villegas HKV:[id number] DOB: 1931/02/10 DOA: 07/25/2017  Referring MD/NP/PA:   PCP: [name], MD   Patient coming from:  The patient is coming from ALF.  At baseline, pt is dependent for most of ADL.   Chief Complaint:  Confusion  HPI: Lisa Villegas is a 84 y.o. female with medical history significant of autism, hypertension, hyperlipidemia, diabetes mellitus, GERD, depression with anxiety, CKD 3, Hodgkin's disease in remission, iron deficiency anemia, who presents with confusion.  Per her care giver, pt was noted to be more confused that her baseline. She seems to have right leg weakness. She also has difficulty speaking and difficulty with naming things. She was last known to be normal sometime yesterday evening. Care giver states that pt normally has tremor in right hand, but she is noted to have tremor in both hands today. She was also noted to have significantly elevated blood pressure and was given hydralazine to bring her blood pressure down. Per care give, pt does not seem to have chest pain, shortness of breath, cough, nausea, vomiting, diarrhea or abdominal pain.  She did not complain of symptoms of UTI.  She is wearing diaper.  ED Course: pt was found to have WBC 11.4, INR 1.04, negative troponin, negative urinalysis, stable renal function, potassium 5.4, temperature 99.8, no tachypnea, RR 22, oxygen saturation 96% on room air, CT head is negative for acute intracranial abnormalities, pending chest x-ray.  Patient is placed on telemetry bed for observation.  Review of Systems: Could not be reviewed due to confusion  Allergy:  Allergies  Allergen Reactions  . Penicillins Rash    Past Medical History:  Diagnosis Date  . Anemia   . Anxiety and depression    per med rec  . 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 Autism   . Chronic CHF (congestive heart failure) (HCC)    per med rec  . Chronic kidney disease (CKD)    stage 3 per chart in  09/2015  . Diabetes mellitus without complication ([place])   . Diabetic neuropathy ([place])   . History of thyroid nodule    nontoxic goiter per med rec  . Hodgkin disease ([place])    in 79  . Hodgkin's disease ([place])   . HTN (hypertension), benign   . Hyperlipidemia   . Incontinence   . Lack of sensation   . Osteopenia    per med rec  . Poor balance   . Tubular adenoma of colon 01/2017  . Vitamin D deficiency     Past Surgical History:  Procedure Laterality Date  . ABDOMINAL HYSTERECTOMY    . BREAST LUMPECTOMY Left     Social History:  reports that she has never smoked. She has never used smokeless tobacco. She reports that she does not drink alcohol or use drugs.  Family History:  Family History  Problem Relation Age of Onset  . CAD Mother        died at 17 yo  . Atrial fibrillation Mother        and tachycardia  . CAD Father        died at 70 yo  . Atrial fibrillation Father        and tachycardia     Prior to Admission medications   Medication Sig Start Date End Date Taking? Authorizing Provider  atorvastatin (LIPITOR) 20 MG tablet Take 1 tablet (20 mg total) by mouth every evening. 10/20/16   Girtha Rm, NP-C  calcium carbonate (TUMS EX) 750 MG chewable tablet Chew 1 tablet by mouth daily.    [provider]  Cetylpyridinium Chloride (CREST PRO-HEALTH MT) Use as directed in the mouth or throat.    [provider]  Cholecalciferol (VITAMIN D3) 2000 UNITS TABS Take by mouth daily.    [provider]  erythromycin ophthalmic ointment Place a 1/2 inch ribbon of ointment into the lower eyelid times daily for 5 days. 05/12/17   Charlesetta Shanks, MD  ferrous sulfate 325 (65 FE) MG tablet Take 325 mg by mouth 2 (two) times daily.    [provider]  fesoterodine (TOVIAZ) 8 MG TB24 tablet TAKE 1 TAB BY MOUTH EVERY DAY.  DO NOT CRUSH OR CHEW . 10/25/16   Denita Lung, MD  hydrALAZINE (APRESOLINE) 25 MG tablet Take 1 tablet (25 mg total) by  mouth 3 (three) times daily as needed. Hydralazine 25 mg for systolic blood pressure 408 or greater and diastolic pressure 85 or greater. 05/12/17   Charlesetta Shanks, MD  magnesium chloride (SLOW-MAG) 64 MG TBEC SR tablet Take 1 tablet by mouth 2 (two) times daily.     [provider]  metFORMIN (GLUCOPHAGE) 500 MG tablet Take 500-1,000 mg by mouth 2 (two) times daily. Takes 1 tablet in the morning and 2 tablets at bedtime    [provider]  Multiple Vitamins-Minerals (CERTA-VITE PO) Take by mouth.    [provider]  Nutritional Supplements (GLUCERNA PO) Take by mouth. 1 can every day    [provider]  omeprazole (PRILOSEC) 40 MG capsule Take by mouth 40 mg bid for 3 months,then omeprazole 40 mg every a.m. 02/01/17   [name], MD  Sodium Fluoride (PREVIDENT 5000 BOOSTER PLUS DT) Place onto teeth as directed.    [provider]    Physical Exam: Vitals:   07/26/17 0000 07/26/17 0117 07/26/17 0200 07/26/17 0400  BP:  (!) 217/99 (!) 172/72 (!) 150/67  Pulse:  69 (!) 110 100  Resp:  18 18 18   Temp:  98.6 F (37 C)    TempSrc:  Oral    SpO2:  98% 100% 98%  Weight: 63.5 kg (140 lb) 63 kg (138 lb 15.3 oz)    Height: 5\' 2"  (1.575 m) 5\' 2"  (1.575 m)     General: Not in acute distress HEENT:       Eyes: PERRL, EOMI, no scleral icterus.       ENT: No discharge from the ears and nose, no pharynx injection, no tonsillar enlargement.        Neck: No JVD, no bruit, no mass felt. Heme: No neck lymph node enlargement. Cardiac: S1/S2, RRR, No murmurs, No gallops or rubs. Respiratory: No rales, wheezing, rhonchi or rubs. GI: Soft, nondistended, nontender, no organomegaly, BS present. GU: No hematuria Ext: No pitting leg edema bilaterally. 2+DP/PT pulse bilaterally. Musculoskeletal: No joint deformities, No joint redness or warmth, no limitation of ROM in spin. Skin: No rashes.  Neuro: confused, but oriented to place and person, but not time,  cranial nerves II-XII grossly intact. Muscle strength 5/5 in all extremities, sensation to light touch intact. Brachial reflex 2+ bilaterally. Negative Babinski's sign.  Psych: Patient is not psychotic.   Labs on Admission: I have personally reviewed following labs and imaging studies  CBC: Recent Labs  Lab 07/25/17 1809 07/25/17 1818  WBC 11.4*  --   NEUTROABS 9.7*  --   HGB 12.5 13.6  HCT 39.0 40.0  MCV  81.1  --   PLT 290  --    Basic Metabolic Panel: Recent Labs  Lab 07/25/17 1809 07/25/17 1818  NA 133* 135  K 5.5* 5.4*  CL 100* 102  CO2 20*  --   GLUCOSE 239* 242*  BUN 32* 31*  CREATININE 1.22* 1.10*  CALCIUM 9.9  --    GFR: Estimated Creatinine Clearance: 33.2 mL/min (A) (by C-G formula based on SCr of 1.1 mg/dL (H)). Liver Function Tests: Recent Labs  Lab 07/25/17 1809  AST 26  ALT 22  ALKPHOS 71  BILITOT 0.6  PROT 7.7  ALBUMIN 3.9   No results for input(s): LIPASE, AMYLASE in the last 168 hours. No results for input(s): AMMONIA in the last 168 hours. Coagulation Profile: Recent Labs  Lab 07/25/17 1809  INR 1.04   Cardiac Enzymes: No results for input(s): CKTOTAL, CKMB, CKMBINDEX, TROPONINI in the last 168 hours. BNP (last 3 results) No results for input(s): PROBNP in the last 8760 hours. HbA1C: No results for input(s): HGBA1C in the last 72 hours. CBG: Recent Labs  Lab 07/25/17 1824  GLUCAP 239*   Lipid Profile: No results for input(s): CHOL, HDL, LDLCALC, TRIG, CHOLHDL, LDLDIRECT in the last 72 hours. Thyroid Function Tests: No results for input(s): TSH, T4TOTAL, FREET4, T3FREE, THYROIDAB in the last 72 hours. Anemia Panel: No results for input(s): VITAMINB12, FOLATE, FERRITIN, TIBC, IRON, RETICCTPCT in the last 72 hours. Urine analysis:    Component Value Date/Time   COLORURINE YELLOW 07/25/2017 2120   APPEARANCEUR HAZY (A) 07/25/2017 2120   LABSPEC 1.024 07/25/2017 2120   PHURINE 5.0 07/25/2017 2120   GLUCOSEU 50 (A) 07/25/2017  2120   HGBUR NEGATIVE 07/25/2017 2120   BILIRUBINUR NEGATIVE 07/25/2017 2120   BILIRUBINUR n 03/14/2016 1227   KETONESUR 5 (A) 07/25/2017 2120   PROTEINUR 100 (A) 07/25/2017 2120   UROBILINOGEN negative 03/14/2016 1227   NITRITE NEGATIVE 07/25/2017 2120   LEUKOCYTESUR NEGATIVE 07/25/2017 2120   Sepsis Labs: @LABRCNTIP (procalcitonin:4,lacticidven:4) )No results found for this or any previous visit (from the past 240 hour(s)).   Radiological Exams on Admission: Ct Head Wo Contrast  Result Date: 07/25/2017 CLINICAL DATA:  Unable wall. EXAM: CT HEAD WITHOUT CONTRAST TECHNIQUE: Contiguous axial images were obtained from the base of the skull through the vertex without intravenous contrast. COMPARISON:  CT scan of February 05, 2016. FINDINGS: Brain: Mild diffuse cortical atrophy is noted. Mild chronic ischemic white matter disease is noted. No mass effect or midline shift is noted. Ventricular size is within normal limits. There is no evidence of mass lesion, hemorrhage or acute infarction. Vascular: No hyperdense vessel or unexpected calcification. Skull: Normal. Negative for fracture or focal lesion. Sinuses/Orbits: Left sphenoid sinusitis. Other: None. IMPRESSION: Mild diffuse cortical atrophy. Mild chronic ischemic white matter disease. No acute intracranial abnormality seen. Electronically Signed   By: Marijo Conception, M.D.   On: 07/25/2017 19:35   Mr Brain Wo Contrast  Result Date: 07/26/2017 CLINICAL DATA:  Altered mental status EXAM: MRI HEAD WITHOUT CONTRAST TECHNIQUE: Multiplanar, multiecho pulse sequences of the brain and surrounding structures were obtained without intravenous contrast. COMPARISON:  Head CT 07/25/2017 FINDINGS: BRAIN: The midline structures are normal. There is no acute infarct or acute hemorrhage. No mass lesion, hydrocephalus, dural abnormality or extra-axial collection. Multifocal white matter hyperintensity, most commonly due to chronic ischemic microangiopathy. No  age-advanced or lobar predominant atrophy. There is ar focus of chronic microhemorrhage in the left occipital lobe with associated low T1/T2-weighted signal structure measuring  3 mm in diameter. VASCULAR: Major intracranial arterial and venous sinus flow voids are preserved. SKULL AND UPPER CERVICAL SPINE: The visualized skull base, calvarium, upper cervical spine and extracranial soft tissues are normal. SINUSES/ORBITS: No fluid levels or advanced mucosal thickening. No mastoid or middle ear effusion. Normal orbits. IMPRESSION: 1. No acute intracranial abnormality. 2. 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 Electronically Signed   By: Ulyses Jarred M.D.   On: 07/26/2017 00:44   Dg Chest Port 1 View  Result Date: 07/26/2017 CLINICAL DATA:  Altered mental status EXAM: PORTABLE CHEST 1 VIEW COMPARISON:  None. FINDINGS: Heart size and pulmonary vascularity are normal. Emphysematous changes in the lungs. Central interstitial pattern with bronchial thickening suggesting chronic bronchitis. Linear atelectasis or fibrosis in the right lung base. No focal consolidation. No blunting of costophrenic angles. No pneumothorax. Old right rib fractures. Calcification of the aorta. IMPRESSION: Emphysematous and chronic bronchitic changes in the lungs. Linear atelectasis or fibrosis in the right lung base. No focal consolidation. Aortic atherosclerosis. Electronically Signed   By: Lucienne Capers M.D.   On: 07/26/2017 02:40     EKG: Independently reviewed.  Sinus rhythm, QTC 472, anteroseptal infarction pattern   Assessment/Plan Principal Problem:   Acute metabolic encephalopathy Active Problems:   Type II diabetes mellitus with renal manifestations (HCC)   Hypertension   Hyperlipidemia   CKD (chronic kidney disease), stage III (HCC)   Anemia   GERD (gastroesophageal reflux disease)   Hyperkalemia   Hypertensive urgency   Acute metabolic encephalopathy and possible  difficulty speaking and possible right leg weakness: Etiology is not clear. CT head negative. On physical examination, I could not find focal neurologic deficit.  Patient could have transient symptoms for possible TIA.  Patient has a mild leukocytosis with WBC 11.4, but no fever.  Urinalysis negative, no respiratory distress.  Pending chest x-ray.  Does not seem to have sepsis of infection.  -will place on tele bed for obs -will get stat MRI of brain. If positive for stroke, will consult Neurology - start ASA empirically - fasting lipid panel and HbA1c  - swallowing screen - PT/OT consult  Addendum:  MRI is negative for stroke, but showed a 3 mm left occipital lobe cavernous angioma with associated chronic blood products. -will continue to do TIA work up -f/u carotid Doppler, MRA, and 2D echo   Type II diabetes mellitus with renal manifestations ([place]): Last A1c 6.9 on 11/29/16, well controled. Patient is taking metformin at home -SSI  HTN: bp is 199/82. Pt is on prn hydralazine at home -Schedule hydralazine 25 mg 3 times daily -IV hydralazine prn  Hyperlipidemia: -lipitor  CKD (chronic kidney disease), stage III ([place]): stable. Cre 1.1o and BUN 31 -f/u by BMP  Anemia-iron deficiency: Hemoglobin 13.6 -Continue iron supplement  GERD: -Protonix  Hyperkalemia: K=5.4 -Kayexalate 15 g x 1 -IV fluid: Normal saline 75 cc/h  DVT ppx: SQ Lovenox Code Status: DNR per POA Family Communication: Yes, patient's    at bed side Disposition Plan:  Anticipate discharge back to previous ALF Consults called:  NONE Admission status: Obs / tele   Date of Service 07/26/2017    Ivor Costa [facility] Pager [phone]  If 7PM-7AM, please contact night-coverage [email or web address] Password [email or web address] 07/26/2017, 4:58 AM

## 2017-07-25 NOTE — ED Provider Notes (Signed)
[facility] EMERGENCY DEPARTMENT Provider Note   CSN: [id number] Arrival date & time: 07/25/17  1652     History   Chief Complaint Chief Complaint  Patient presents with  . Neurologic Problem    HPI Lisa Villegas is a 84 y.o. female.  The history is provided by a relative. The history is limited by the condition of the patient (Altered mental status).  She has history of diabetes, hypertension, autism, Hodgkin's disease, hyperlipidemia, chronic kidney disease stage III and was sent to the emergency department because she was noted to have a variety of neurologic symptoms.  She was unable to bear weight and seem to be weaker on her left leg.  She was noted to have tremor in both of her hands although she normally only has tremor in her right hand.  She was having difficulty speaking and difficulty with naming things.  She was last known to be normal sometime yesterday evening.  She was also noted to have significantly elevated blood pressure and was given hydralazine to bring her blood pressure down.  Past Medical History:  Diagnosis Date  . Anemia   . Anxiety and depression    per med rec  . 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 Autism   . Chronic CHF (congestive heart failure) (HCC)    per med rec  . Chronic kidney disease (CKD)    stage 3 per chart in 09/2015  . Diabetes mellitus without complication ([place])   . Diabetic neuropathy ([place])   . History of thyroid nodule    nontoxic goiter per med rec  . Hodgkin disease ([place])    in 51  . Hodgkin's disease ([place])   . HTN (hypertension), benign   . Hyperlipidemia   . Incontinence   . Lack of sensation   . Osteopenia    per med rec  . Poor balance   . Tubular adenoma of colon 01/2017  . Vitamin D deficiency     Patient Active Problem List   Diagnosis Date Noted  . Anemia 11/30/2016  . Anxiety and depression   . Autism   . History of thyroid nodule   . Hodgkin disease ([place])   . HTN (hypertension), benign     . Incontinence   . Lack of sensation   . Poor balance   . Chronic kidney disease (CKD)   . Osteopenia   . Vitamin D deficiency   . Follow-up examination for injury 02/08/2016  . Diabetes mellitus type 2, controlled, without complications ([place]) 09/25/1599  . Hypertension 01/01/2016  . Incontinence of feces 01/01/2016  . Urinary incontinence without sensory awareness 01/01/2016  . Hyperlipidemia 01/01/2016    Past Surgical History:  Procedure Laterality Date  . ABDOMINAL HYSTERECTOMY    . BREAST LUMPECTOMY Left      OB History   None      Home Medications    Prior to Admission medications   Medication Sig Start Date End Date Taking? Authorizing Provider  atorvastatin (LIPITOR) 20 MG tablet Take 1 tablet (20 mg total) by mouth every evening. 10/20/16   Henson, Vickie L, NP-C  calcium carbonate (TUMS EX) 750 MG chewable tablet Chew 1 tablet by mouth daily.    [provider]  Cetylpyridinium Chloride (CREST PRO-HEALTH MT) Use as directed in the mouth or throat.    [provider]  Cholecalciferol (VITAMIN D3) 2000 UNITS TABS Take by mouth daily.    [provider]  erythromycin ophthalmic ointment Place a 1/2  inch ribbon of ointment into the lower eyelid times daily for 5 days. 05/12/17   Charlesetta Shanks, MD  ferrous sulfate 325 (65 FE) MG tablet Take 325 mg by mouth 2 (two) times daily.    [provider]  fesoterodine (TOVIAZ) 8 MG TB24 tablet TAKE 1 TAB BY MOUTH EVERY DAY.  DO NOT CRUSH OR CHEW . 10/25/16   Denita Lung, MD  hydrALAZINE (APRESOLINE) 25 MG tablet Take 1 tablet (25 mg total) by mouth 3 (three) times daily as needed. Hydralazine 25 mg for systolic blood pressure 161 or greater and diastolic pressure 85 or greater. 05/12/17   Charlesetta Shanks, MD  magnesium chloride (SLOW-MAG) 64 MG TBEC SR tablet Take 1 tablet by mouth 2 (two) times daily.     [provider]  metFORMIN (GLUCOPHAGE) 500 MG tablet Take 500-1,000 mg by  mouth 2 (two) times daily. Takes 1 tablet in the morning and 2 tablets at bedtime    [provider]  Multiple Vitamins-Minerals (CERTA-VITE PO) Take by mouth.    [provider]  Nutritional Supplements (GLUCERNA PO) Take by mouth. 1 can every day    [provider]  omeprazole (PRILOSEC) 40 MG capsule Take by mouth 40 mg bid for 3 months,then omeprazole 40 mg every a.m. 02/01/17   [name], MD  Sodium Fluoride (PREVIDENT 5000 BOOSTER PLUS DT) Place onto teeth as directed.    [provider]    Family History Family History  Problem Relation Age of Onset  . CAD Mother        died at 29 yo  . Atrial fibrillation Mother        and tachycardia  . CAD Father        died at 79 yo  . Atrial fibrillation Father        and tachycardia    Social History Social History   Tobacco Use  . Smoking status: Never Smoker  . Smokeless tobacco: Never Used  Substance Use Topics  . Alcohol use: No  . Drug use: No     Allergies   Penicillins   Review of Systems Review of Systems  Unable to perform ROS: Mental status change     Physical Exam Updated Vital Signs BP (!) 199/82 (BP Location: Left Arm)   Pulse 83   Temp 98.6 F (37 C) (Oral)   Resp (!) 22   SpO2 99%   Physical Exam  Nursing note and vitals reviewed.  84 year old female, resting comfortably and in no acute distress. Vital signs are significant for elevated systolic blood pressure. Oxygen saturation is 99%, which is normal. Head is normocephalic and atraumatic. PERRLA, EOMI. Oropharynx is clear.  Conjunctivae are moderately injected bilaterally. Neck is nontender and supple without adenopathy or JVD. Back is nontender and there is no CVA tenderness. Lungs are clear without rales, wheezes, or rhonchi. Chest is nontender. Heart has regular rate and rhythm with occasional premature beats.  No murmurs appreciated. Abdomen is soft, flat, nontender without masses or  hepatosplenomegaly and peristalsis is normoactive. Extremities have no cyanosis or edema, full range of motion is present. Skin is warm and dry without rash. Neurologic: She is awake and oriented to person and place but not time.  Cranial nerves are grossly intact.  Speech is normal.  She follows commands rather poorly, but motor strength in upper extremities appears to be 5/5.  Left leg has 5/5 strength, right leg has 4/5 strength.  No Babinski  reflex is seen.  Resting tremor noted involving the right hand.  ED Treatments / Results  Labs (all labs ordered are listed, but only abnormal results are displayed) Labs Reviewed  CBC - Abnormal; Notable for the following components:      Result Value   WBC 11.4 (*)    RDW 16.5 (*)    All other components within normal limits  DIFFERENTIAL - Abnormal; Notable for the following components:   Neutro Abs 9.7 (*)    All other components within normal limits  COMPREHENSIVE METABOLIC PANEL - Abnormal; Notable for the following components:   Sodium 133 (*)    Potassium 5.5 (*)    Chloride 100 (*)    CO2 20 (*)    Glucose, Bld 239 (*)    BUN 32 (*)    Creatinine, Ser 1.22 (*)    GFR calc non Af Amer 40 (*)    GFR calc Af Amer 46 (*)    All other components within normal limits  URINALYSIS, ROUTINE W REFLEX MICROSCOPIC - Abnormal; Notable for the following components:   APPearance HAZY (*)    Glucose, UA 50 (*)    Ketones, ur 5 (*)    Protein, ur 100 (*)    All other components within normal limits  CBG MONITORING, ED - Abnormal; Notable for the following components:   Glucose-Capillary 239 (*)    All other components within normal limits  I-STAT CHEM 8, ED - Abnormal; Notable for the following components:   Potassium 5.4 (*)    BUN 31 (*)    Creatinine, Ser 1.10 (*)    Glucose, Bld 242 (*)    Calcium, Ion 1.13 (*)    TCO2 19 (*)    All other components within normal limits  PROTIME-INR  APTT  I-STAT TROPONIN, ED    EKG EKG  Interpretation  Date/Time:  Tuesday July 25 2017 18:16:04 EDT Ventricular Rate:  89 PR Interval:  132 QRS Duration: 90 QT Interval:  388 QTC Calculation: 472 R Axis:   69 Text Interpretation:  Normal sinus rhythm Cannot rule out Anterior infarct , age undetermined Abnormal ECG When compared with ECG of 05/12/2017, No significant change was found Confirmed by Delora Fuel ([id number]) on 07/25/2017 10:58:58 PM   Radiology Ct Head Wo Contrast  Result Date: 07/25/2017 CLINICAL DATA:  Unable wall. EXAM: CT HEAD WITHOUT CONTRAST TECHNIQUE: Contiguous axial images were obtained from the base of the skull through the vertex without intravenous contrast. COMPARISON:  CT scan of February 05, 2016. FINDINGS: Brain: Mild diffuse cortical atrophy is noted. Mild chronic ischemic white matter disease is noted. No mass effect or midline shift is noted. Ventricular size is within normal limits. There is no evidence of mass lesion, hemorrhage or acute infarction. Vascular: No hyperdense vessel or unexpected calcification. Skull: Normal. Negative for fracture or focal lesion. Sinuses/Orbits: Left sphenoid sinusitis. Other: None. IMPRESSION: Mild diffuse cortical atrophy. Mild chronic ischemic white matter disease. No acute intracranial abnormality seen. Electronically Signed   By: Marijo Conception, M.D.   On: 07/25/2017 19:35    Procedures Procedures   Medications Ordered in ED Medications - No data to display   Initial Impression / Assessment and Plan / ED Course  I have reviewed the triage vital signs and the nursing notes.  Pertinent labs & imaging results that were available during my care of the patient were reviewed by me and considered in my medical decision making (see chart for details).  Altered  mental status with probable stroke.  Consider possibility of occult infection.  Also, consider metabolic encephalopathy.  She is noted to be afebrile.  Glucose is mildly elevated-no evidence of hypoglycemia.   Urinalysis shows no evidence of infection.  Will check chest x-ray.  CT of head was unremarkable, she will be sent for MRI scan to look for evidence of acute stroke.  She will need to be admitted for further evaluation. Case is discussed with Dr. Blaine Hamper of [facility], who agrees to admit the patient.  Final Clinical Impressions(s) / ED Diagnoses   Final diagnoses:  Altered mental status, unspecified altered mental status type  Right leg weakness  Essential hypertension    ED Discharge Orders    None       Delora Fuel, MD 85/02/77 [id number]

## 2017-07-25 NOTE — ED Notes (Signed)
Pt handed a UA cup; Pt understands she needs to provide a specimen.

## 2017-07-25 NOTE — Progress Notes (Signed)
Delay in xray due to RN and Hospitalist  ED will call when ready

## 2017-07-26 ENCOUNTER — Observation Stay (HOSPITAL_COMMUNITY): Payer: Medicare Other

## 2017-07-26 ENCOUNTER — Observation Stay (HOSPITAL_BASED_OUTPATIENT_CLINIC_OR_DEPARTMENT_OTHER): Payer: Medicare Other

## 2017-07-26 ENCOUNTER — Other Ambulatory Visit: Payer: Self-pay

## 2017-07-26 DIAGNOSIS — G9341 Metabolic encephalopathy: Secondary | ICD-10-CM | POA: Diagnosis not present

## 2017-07-26 DIAGNOSIS — I361 Nonrheumatic tricuspid (valve) insufficiency: Secondary | ICD-10-CM

## 2017-07-26 DIAGNOSIS — I10 Essential (primary) hypertension: Secondary | ICD-10-CM | POA: Diagnosis not present

## 2017-07-26 DIAGNOSIS — E785 Hyperlipidemia, unspecified: Secondary | ICD-10-CM

## 2017-07-26 DIAGNOSIS — N183 Chronic kidney disease, stage 3 (moderate): Secondary | ICD-10-CM | POA: Diagnosis not present

## 2017-07-26 DIAGNOSIS — M2550 Pain in unspecified joint: Secondary | ICD-10-CM | POA: Diagnosis not present

## 2017-07-26 DIAGNOSIS — E875 Hyperkalemia: Secondary | ICD-10-CM

## 2017-07-26 DIAGNOSIS — G459 Transient cerebral ischemic attack, unspecified: Secondary | ICD-10-CM | POA: Diagnosis not present

## 2017-07-26 DIAGNOSIS — K219 Gastro-esophageal reflux disease without esophagitis: Secondary | ICD-10-CM | POA: Diagnosis not present

## 2017-07-26 DIAGNOSIS — J439 Emphysema, unspecified: Secondary | ICD-10-CM | POA: Diagnosis not present

## 2017-07-26 DIAGNOSIS — R410 Disorientation, unspecified: Secondary | ICD-10-CM

## 2017-07-26 DIAGNOSIS — D649 Anemia, unspecified: Secondary | ICD-10-CM | POA: Diagnosis not present

## 2017-07-26 DIAGNOSIS — I160 Hypertensive urgency: Secondary | ICD-10-CM | POA: Diagnosis present

## 2017-07-26 DIAGNOSIS — Z7401 Bed confinement status: Secondary | ICD-10-CM | POA: Diagnosis not present

## 2017-07-26 DIAGNOSIS — E1122 Type 2 diabetes mellitus with diabetic chronic kidney disease: Secondary | ICD-10-CM | POA: Diagnosis not present

## 2017-07-26 DIAGNOSIS — R4182 Altered mental status, unspecified: Secondary | ICD-10-CM | POA: Diagnosis not present

## 2017-07-26 LAB — ECHOCARDIOGRAM COMPLETE
Height: 62 in
WEIGHTICAEL: 2223.3 [oz_av]

## 2017-07-26 LAB — RAPID URINE DRUG SCREEN, HOSP PERFORMED
Amphetamines: NOT DETECTED
Barbiturates: NOT DETECTED
Benzodiazepines: NOT DETECTED
COCAINE: NOT DETECTED
OPIATES: NOT DETECTED
TETRAHYDROCANNABINOL: NOT DETECTED

## 2017-07-26 LAB — LIPID PANEL
CHOL/HDL RATIO: 2 ratio
Cholesterol: 123 mg/dL (ref 0–200)
HDL: 61 mg/dL (ref >40)
LDL Cholesterol: 52 mg/dL (ref 0–99)
Triglycerides: 49 mg/dL (ref <150)
VLDL: 10 mg/dL (ref 0–40)

## 2017-07-26 LAB — GLUCOSE, CAPILLARY
GLUCOSE-CAPILLARY: 130 mg/dL — AB (ref 65–99)
GLUCOSE-CAPILLARY: 114 mg/dL — AB (ref 65–99)

## 2017-07-26 LAB — HEMOGLOBIN A1C
Hgb A1c MFr Bld: 7.7 % — ABNORMAL HIGH (ref 4.8–5.6)
Mean Plasma Glucose: 174.29 mg/dL

## 2017-07-26 LAB — BRAIN NATRIURETIC PEPTIDE: B Natriuretic Peptide: 66.4 pg/mL (ref 0.0–100.0)

## 2017-07-26 LAB — MRSA PCR SCREENING: MRSA by PCR: NEGATIVE

## 2017-07-26 MED ORDER — HYDRALAZINE HCL 20 MG/ML IJ SOLN
10.0000 mg | Freq: Once | INTRAMUSCULAR | Status: AC
  Administered 2017-07-26: 10 mg via INTRAVENOUS
  Filled 2017-07-26: qty 1

## 2017-07-26 MED ORDER — CLOPIDOGREL BISULFATE 75 MG PO TABS
75.0000 mg | ORAL_TABLET | Freq: Every day | ORAL | Status: DC
  Administered 2017-07-26: 75 mg via ORAL
  Filled 2017-07-26: qty 1

## 2017-07-26 MED ORDER — ASPIRIN 81 MG PO TABS: 81.0000 mg | ORAL_TABLET | Freq: Every day | ORAL | 0 refills | Status: DC

## 2017-07-26 MED ORDER — CLOPIDOGREL BISULFATE 75 MG PO TABS: 75.0000 mg | ORAL_TABLET | Freq: Every day | ORAL | 0 refills | Status: DC

## 2017-07-26 NOTE — Clinical Social Work Note (Signed)
Clinical Social Work Assessment  Patient Details  Name: Lisa Villegas MRN: [id number] Date of Birth: 28-Dec-1930  Date of referral:  07/26/17               Reason for consult:  Discharge Planning                Permission sought to share information with:  Facility Sport and exercise psychologist, Family Supports Permission granted to share information::  Yes, Verbal Permission Granted  Name::     Financial planner::  [facility]  Relationship::  POA  Contact Information:  [phone]  Housing/Transportation Living arrangements for the past 2 months:  [place] of Information:  Patient, Other (Comment Required) Patient Interpreter Needed:  None Criminal Activity/Legal Involvement Pertinent to Current Situation/Hospitalization:  No - Comment as needed Significant Relationships:  Other(Comment) Lives with:  Facility Resident Do you feel safe going back to the place where you live?  Yes Need for family participation in patient care:  No (Coment)  Care giving concerns:  CSW received consult regarding discharge planning. CSW spoke with patient/POA [name]. Patient resides at [facility] and will return at discharge. CSW to continue to follow and assist with discharge planning needs.   Social Worker assessment / plan:  CSW spoke with patient/Lisa Villegas regarding discharge plan.   Employment status:  Retired Forensic scientist:  Medicare PT Recommendations:  Not assessed at this time Information / Referral to community resources:     Patient/Family's Response to care:  Patient/Lisa Villegas report understanding of discharge back to ALF and request PTAR for transport. Facility reviewing DC Summary for patient to return.   Patient/Family's Understanding of and Emotional Response to Diagnosis, Current Treatment, and Prognosis:  Patient/family is realistic regarding therapy needs and expressed being hopeful for SNF placement. Patient/Lisa Villegas expressed understanding of CSW role and  discharge process as well as medical condition. No questions/concerns about plan or treatment.    Emotional Assessment Appearance:  Appears stated age Attitude/Demeanor/Rapport:  (Appropriate) Affect (typically observed):  Appropriate Orientation:  Oriented to Self, Oriented to Situation, Oriented to Place, Oriented to  Time Alcohol / Substance use:  Not Applicable Psych involvement (Current and /or in the community):  No (Comment)  Discharge Needs  Concerns to be addressed:  Care Coordination Readmission within the last 30 days:  No Current discharge risk:  None Barriers to Discharge:  No Barriers Identified   Benard Halsted, [place] 07/26/2017, 3:43 PM

## 2017-07-26 NOTE — Progress Notes (Signed)
Pt admitted from ED with stroke symptoms, pt alert and oriented, denies any pain at this time, pt settled in bed with her care giver at her bedside, tele monitor put and verified on pt, safety concern explained and addressed accordingly, was however reassured and will continue to monitor. Obasogie-Asidi, Latara Micheli Efe

## 2017-07-26 NOTE — Progress Notes (Signed)
Patient ordered to be discharged, report called and given to [name] at [facility] at 2100, [place] came and picked up at 2300, Belongings at bedside given to patient, patient was calm and reassured. Lisa Villegas L Lisa Villegas

## 2017-07-26 NOTE — Care Management Note (Signed)
Case Management Note  Patient Details  Name: Lisa Villegas MRN: [id number] Date of Birth: [birth year]  Subjective/Objective:    Pt in with acute metabolic encephalopathy. She is from [facility].                Action/Plan: Plan is for patient to return to [facility] when medically ready. CM following.  Expected Discharge Date:                  Expected Discharge Plan:  Assisted Living / Rest Home  In-House Referral:  Clinical Social Work  Discharge planning Services     Post Acute Care Choice:    Choice offered to:     DME Arranged:    DME Agency:     HH Arranged:    [place] Agency:     Status of Service:  In process, will continue to follow  If discussed at Long Length of Stay Meetings, dates discussed:    Additional Comments:  Pollie Friar, RN 07/26/2017, 1:20 PM

## 2017-07-26 NOTE — Evaluation (Signed)
Speech Language Pathology Evaluation Patient Details Name: Tayte Childers MRN: [id number] DOB: 03-Sep-1930 Today's Date: 07/26/2017 Time: 1214-     Problem List:  Patient Active Problem List   Diagnosis Date Noted  . Hypertensive urgency 07/26/2017  . Acute metabolic encephalopathy 25/07/3974  . GERD (gastroesophageal reflux disease) 07/25/2017  . Hyperkalemia 07/25/2017  . Anemia 11/30/2016  . Anxiety and depression   . Autism   . History of thyroid nodule   . Hodgkin disease ([place])   . HTN (hypertension), benign   . Incontinence   . Lack of sensation   . Poor balance   . CKD (chronic kidney disease), stage III ([place])   . Osteopenia   . Vitamin D deficiency   . Follow-up examination for injury 02/08/2016  . Type II diabetes mellitus with renal manifestations ([place]) 01/01/2016  . Hypertension 01/01/2016  . Incontinence of feces 01/01/2016  . Urinary incontinence without sensory awareness 01/01/2016  . Hyperlipidemia 01/01/2016   Past Medical History:  Past Medical History:  Diagnosis Date  . Anemia   . Anxiety and depression    per med rec  . 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 Autism   . Chronic CHF (congestive heart failure) (HCC)    per med rec  . Chronic kidney disease (CKD)    stage 3 per chart in 09/2015  . Diabetes mellitus without complication ([place])   . Diabetic neuropathy ([place])   . History of thyroid nodule    nontoxic goiter per med rec  . Hodgkin disease ([place])    in 24  . Hodgkin's disease ([place])   . HTN (hypertension), benign   . Hyperlipidemia   . Incontinence   . Lack of sensation   . Osteopenia    per med rec  . Poor balance   . Tubular adenoma of colon 01/2017  . Vitamin D deficiency    Past Surgical History:  Past Surgical History:  Procedure Laterality Date  . ABDOMINAL HYSTERECTOMY    . BREAST LUMPECTOMY Left    HPI:  84 y.o. female, PhD in math from [place], with medical history significant for autism, hypertension, hyperlipidemia, diabetes  mellitus, GERD, depression with anxiety, CKD 3, Hodgkin's disease in remission, iron deficiency anemia, who presents with confusion.  Undergoing w/u for stroke.  CCT amd MRI negative.    Assessment / Plan / Recommendation Clinical Impression  Pt's baseline is unknown.  Her speech is without dysarthria and fluency is normal.  Repetition of words and phrases intact.  Confrontational and responsive naming are functional.  Follows commands and answers yes/no questions.  Demonstrates hesitation when answering biographical questions, and spontaneity is diminished.  Baseline function is unknown, although pt does state she lives in [place] and needs assistance showering and with some ADLs.  No acute event per imaging. No acute SLP needs are identified at this time.  Our services will sign off.     SLP Assessment  SLP Recommendation/Assessment: Patient does not need any further Speech Lanaguage Pathology Services    Follow Up Recommendations       Frequency and Duration           SLP Evaluation Cognition  Overall Cognitive Status: Difficult to assess Arousal/Alertness: Awake/alert Orientation Level: Oriented X4 Attention: Sustained Sustained Attention: Appears intact Awareness: Impaired Awareness Impairment: Intellectual impairment Executive Function: Reasoning Reasoning: Impaired Reasoning Impairment: Verbal basic       Comprehension  Auditory Comprehension Overall Auditory Comprehension: Appears within functional limits for tasks assessed Yes/No Questions: Within Functional  Limits Commands: Within Functional Limits Visual Recognition/Discrimination Discrimination: Within Function Limits Reading Comprehension Reading Status: Not tested    Expression Expression Primary Mode of Expression: Verbal Verbal Expression Overall Verbal Expression: Appears within functional limits for tasks assessed Initiation: No impairment Level of Generative/Spontaneous Verbalization: Sentence Repetition:  No impairment Naming: No impairment   Oral / Motor  Oral Motor/Sensory Function Overall Oral Motor/Sensory Function: Within functional limits Motor Speech Overall Motor Speech: Appears within functional limits for tasks assessed   GO                    Juan Quam Laurice 07/26/2017, 12:32 PM

## 2017-07-26 NOTE — Progress Notes (Signed)
Echocardiogram 2D Echocardiogram has been performed.  07/26/2017 4:58 PM Maudry Mayhew, BS, RVT, RDCS, RDMS

## 2017-07-26 NOTE — Progress Notes (Signed)
Patient will DC to: [facility] Anticipated DC date: 07/26/17 Family notified: [name] Transport by: Corey Harold 6:30pm   Per MD patient ready for DC to [facility]. RN, patient, patient's family, and facility notified of DC. Discharge Summary sent to facility. RN given number for report [phone]). DC packet on chart. Ambulance transport requested for patient.   CSW signing off.  Cedric Fishman, LCSW Clinical Social Worker [phone]

## 2017-07-26 NOTE — Discharge Summary (Addendum)
Physician Discharge Summary  Lisa Villegas DGU:[id number] DOB: [birth year] DOA: 07/25/2017  PCP: [name], MD  Admit date: 07/25/2017 Discharge date: 07/26/2017  Admitted From: ALF Disposition: ALF   Recommendations for Outpatient Follow-up:  1. Follow up with PCP in 1-2 weeks. 2. Follow up with neurology in 6-8 weeks. Started aspirin + plavix x 21 days and aspirin indefinitely thereafter. 3. Check BMP, particularly potassium at follow up.   Home Health: None new Equipment/Devices: None new Discharge Condition: Stable CODE STATUS: DNR, confirmed Diet recommendation: Heart healthy, carb-modified.   Brief/Interim Summary: Lisa Villegas is a 84 y.o. female with medical history significant of autism, hypertension, hyperlipidemia, diabetes mellitus, GERD, depression with anxiety, CKD 3, Hodgkin's disease in remission, iron deficiency anemia, who presents with confusion.  Per her care giver, pt was noted to be more confused that her baseline. She seems to have right leg weakness. She also has difficulty speaking and difficulty with naming things. She was last known to be normal sometime yesterday evening. Care giver states that pt normally has tremor in right hand, but she is noted to have tremor in both hands today. She was also noted to have significantly elevated blood pressure and was given hydralazine to bring her blood pressure down. Per care give, pt does not seem to have chest pain, shortness of breath, cough, nausea, vomiting, diarrhea or abdominal pain.  She did not complain of symptoms of UTI.  She is wearing diaper.  ED Course: pt was found to have WBC 11.4, INR 1.04, negative troponin, negative urinalysis, stable renal function, potassium 5.4, temperature 99.8, no tachypnea, RR 22, oxygen saturation 96% on room air, CT head is negative for acute intracranial abnormalities, pending chest x-ray.  Patient is placed on telemetry bed for observation.  Hospital course:  MRI showed incidental angioma and MRA demonstrated CVD/atherosclerosis with stenotic/occluded PCA. Case discussed with neurology, Dr. Leonel Ramsay who recommended DAPT, then ASA given TIA with extensive cerebrovascular disease. No other work up or interventions required. The patient's symptoms have resolved.   Discharge Diagnoses:  Principal Problem:   Acute metabolic encephalopathy Active Problems:   Type II diabetes mellitus with renal manifestations (HCC)   Hypertension   Hyperlipidemia   CKD (chronic kidney disease), stage III (HCC)   Anemia   GERD (gastroesophageal reflux disease)   Hyperkalemia   Hypertensive urgency  Acute metabolic encephalopathy, TIA: Symptoms resolved. CT head negative. No evidence of infection. Significant cerebrovascular atherosclerosis on neuroimaging. - Continue DAPT x3 weeks, then ASA alone. Follow up with neurology in 4-6 weeks (referral made)  Type II diabetes mellitus with renal manifestations ([place]): Last A1c 6.9 on 11/29/16, well controlled. Patient is taking metformin at home  HTN: Monitor at follow up  Hyperlipidemia: -Continue lipitor  CKD (chronic kidney disease), stage III: Stable.  Anemia-iron deficiency: Hemoglobin 13.6 - Continue iron supplement  GERD: - Protonix  Hyperkalemia, mild: SCr stable. Kayexalate provided. No telemetric events.  - Recheck at follow up.  Discharge Instructions Discharge Instructions    Ambulatory referral to Neurology   Complete by:  As directed    An appointment is requested in approximately: 8 weeks. Call Guardian, Bethann Goo.   Diet - low sodium heart healthy   Complete by:  As directed    Diet Carb Modified   Complete by:  As directed    Discharge instructions   Complete by:  As directed    Diet Recommendations for Diabetes  Carbohydrate includes starch, sugar, and fiber.  Of these, only  sugar and starch raise blood glucose.  (Fiber is found in fruits, vegetables [especially skin, seeds, and  stalks] and whole grains.)   Starchy (carb) foods: Bread, rice, pasta, potatoes, corn, cereal, grits, crackers, bagels, muffins, all baked goods.  (Fruit, milk, and yogurt also have carbohydrate, but most of these foods will not spike your blood sugar as most starchy foods will.)  A few fruits do cause high blood sugars; use small portions of bananas (limit to 1/2 at a time), grapes, watermelon, oranges, and most tropical fruits.   Protein foods: Meat, fish, poultry, eggs, dairy foods, and beans such as pinto and kidney beans (beans also provide carbohydrate).   1. Eat at least REAL 3 meals and 1-2 snacks per day. Never go more than 4-5 hours while awake without eating. Eat breakfast within the first hour of getting up.   2. Limit starchy foods to TWO per meal and ONE per snack. ONE portion of a starchy  food is equal to the following:   - ONE slice of bread (or its equivalent, such as half of a hamburger bun).   - 1/2 cup of a "scoopable" starchy food such as potatoes or rice.   - 15 grams of Total Carbohydrate as shown on food label.  3. Include at every meal: a protein food, a carb food, and vegetables and/or fruit.   - Obtain twice the volume of veg's as protein or carbohydrate foods for both lunch and dinner.   - Fresh or frozen veg's are best.   - Keep frozen veg's on hand for a quick vegetable serving.      Move/work out everyday. You will suffer further complications of atherosclerosis/diabetes if you do not stay active.     Allergies as of 07/26/2017      Reactions   Penicillins Rash      Medication List    TAKE these medications   aspirin 81 MG tablet Take 1 tablet (81 mg total) by mouth daily.   atorvastatin 20 MG tablet Commonly known as:  LIPITOR Take 1 tablet (20 mg total) by mouth every evening.   calcium carbonate 750 MG chewable tablet Commonly known as:  TUMS EX Chew 1 tablet by mouth daily.   CERTA-VITE PO Take 1 tablet by mouth daily.   clopidogrel 75 MG  tablet Commonly known as:  PLAVIX Take 1 tablet (75 mg total) by mouth daily.   CREST PRO-HEALTH MT Use as directed 1 Applicatorful in the mouth or throat 2 (two) times daily.   ferrous sulfate 325 (65 FE) MG tablet Take 325 mg by mouth daily.   fesoterodine 8 MG Tb24 tablet Commonly known as:  TOVIAZ TAKE 1 TAB BY MOUTH EVERY DAY.  DO NOT CRUSH OR CHEW .   GLUCERNA PO Take 1 Can by mouth 2 (two) times daily.   hydrALAZINE 25 MG tablet Commonly known as:  APRESOLINE Take 1 tablet (25 mg total) by mouth 3 (three) times daily as needed. Hydralazine 25 mg for systolic blood pressure 616 or greater and diastolic pressure 85 or greater.   INTEGRA F 125-1 MG Caps Take 1 capsule by mouth daily.   magnesium chloride 64 MG Tbec SR tablet Commonly known as:  SLOW-MAG Take 1 tablet by mouth 2 (two) times daily.   metFORMIN 500 MG tablet Commonly known as:  GLUCOPHAGE Take 1,000 mg by mouth 2 (two) times daily.   omeprazole 40 MG capsule Commonly known as:  PRILOSEC Take by mouth 40 mg bid  for 3 months,then omeprazole 40 mg every a.m. What changed:    how much to take  how to take this  when to take this  additional instructions   PREVIDENT 5000 BOOSTER PLUS DT Place 1 application onto teeth every evening.   Vitamin D3 2000 units Tabs Take 1 tablet by mouth daily.       Contact information for follow-up providers    [name], MD Follow up.   Specialty:  Internal Medicine Contact information: [address] [place] [address] [phone]        [facility] Follow up.   Specialty:  Neurology Why:  Call if you aren't contacted in the next 1-2 weeks. Contact information: [address] [place] [phone]           Contact information for after-discharge care    Destination    [facility] .   Service:  Assisted Living Contact information: [address] [address] [phone]                 Allergies  Allergen Reactions  . Penicillins Rash    Consultations:  Neurology, Dr. Leonel Ramsay  Procedures/Studies: Ct Head Wo Contrast  Result Date: 07/25/2017 CLINICAL DATA:  Unable wall. EXAM: CT HEAD WITHOUT CONTRAST TECHNIQUE: Contiguous axial images were obtained from the base of the skull through the vertex without intravenous contrast. COMPARISON:  CT scan of February 05, 2016. FINDINGS: Brain: Mild diffuse cortical atrophy is noted. Mild chronic ischemic white matter disease is noted. No mass effect or midline shift is noted. Ventricular size is within normal limits. There is no evidence of mass lesion, hemorrhage or acute infarction. Vascular: No hyperdense vessel or unexpected calcification. Skull: Normal. Negative for fracture or focal lesion. Sinuses/Orbits: Left sphenoid sinusitis. Other: None. IMPRESSION: Mild diffuse cortical atrophy. Mild chronic ischemic white matter disease. No acute intracranial abnormality seen. Electronically Signed   By: Marijo Conception, M.D.   On: 07/25/2017 19:35   Mr Brain Wo Contrast  Result Date: 07/26/2017 CLINICAL DATA:  Altered mental status EXAM: MRI HEAD WITHOUT CONTRAST TECHNIQUE: Multiplanar, multiecho pulse sequences of the brain and surrounding structures were obtained without intravenous contrast. COMPARISON:  Head CT 07/25/2017 FINDINGS: BRAIN: The midline structures are normal. There is no acute infarct or acute hemorrhage. No mass lesion, hydrocephalus, dural abnormality or extra-axial collection. Multifocal white matter hyperintensity, most commonly due to chronic ischemic microangiopathy. No age-advanced or lobar predominant atrophy. There is ar focus of chronic microhemorrhage in the left occipital lobe with associated low T1/T2-weighted signal structure measuring 3 mm in diameter. VASCULAR: Major intracranial arterial and venous sinus flow voids are  preserved. SKULL AND UPPER CERVICAL SPINE: The visualized skull base, calvarium, upper cervical spine and extracranial soft tissues are normal. SINUSES/ORBITS: No fluid levels or advanced mucosal thickening. No mastoid or middle ear effusion. Normal orbits. IMPRESSION: 1. No acute intracranial abnormality. 2. 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 Electronically Signed   By: Ulyses Jarred M.D.   On: 07/26/2017 00:44   Dg Chest Port 1 View  Result Date: 07/26/2017 CLINICAL DATA:  Altered mental status EXAM: PORTABLE CHEST 1 VIEW COMPARISON:  None. FINDINGS: Heart size and pulmonary vascularity are normal. Emphysematous changes in the lungs. Central interstitial pattern with bronchial thickening suggesting chronic bronchitis. Linear atelectasis or fibrosis in the right lung base. No focal consolidation. No blunting of costophrenic  angles. No pneumothorax. Old right rib fractures. Calcification of the aorta. IMPRESSION: Emphysematous and chronic bronchitic changes in the lungs. Linear atelectasis or fibrosis in the right lung base. No focal consolidation. Aortic atherosclerosis. Electronically Signed   By: Lucienne Capers M.D.   On: 07/26/2017 02:40   Mr Jodene Nam Head Wo Contrast  Result Date: 07/26/2017 CLINICAL DATA:  TIA.  Confusion EXAM: MRA HEAD WITHOUT CONTRAST TECHNIQUE: Angiographic images of the Circle of Willis were obtained using MRA technique without intravenous contrast. COMPARISON:  MRI head 07/26/2017 FINDINGS: Both vertebral arteries patent to the basilar. Basilar widely patent with mild atherosclerotic irregularity. PICA not visualized possibly due to patient positioning. AICA, superior cerebellar, and posterior cerebral arteries patent proximally. Mild stenosis distal right PCA and high-grade stenosis versus occlusion of the distal left PCA. Atherosclerotic irregularity and moderate stenosis in the cavernous carotid bilaterally. Anterior and middle  cerebral arteries patent bilaterally. Atherosclerotic irregularity distal MCA branches bilaterally. Negative for cerebral aneurysm IMPRESSION: Intracranial atherosclerotic disease involving the posterior cerebral arteries and middle cerebral arteries bilaterally. Severe stenosis versus occlusion distal left PCA. Electronically Signed   By: Franchot Gallo M.D.   On: 07/26/2017 11:35     Subjective: Sparse speech, but denies any pain, numbness, weakness.   Discharge Exam: Vitals:   07/26/17 1110 07/26/17 1450  BP: (!) 148/64 (!) 144/92  Pulse: 85   Resp: 18   Temp: 98.2 F (36.8 C)   SpO2: 100%    General: Pt is alert, awake, not in acute distress Cardiovascular: RRR, S1/S2 +, no rubs, no gallops Respiratory: CTA bilaterally, no wheezing, no rhonchi Abdominal: Soft, NT, ND, bowel sounds + Neuro: No unilateral weakness appreciated. Was bearing weight on right leg per pt's family, but is not completely cooperative for my exam.    Labs: BNP (last 3 results) Recent Labs    07/26/17 0040  BNP 74.0   Basic Metabolic Panel: Recent Labs  Lab 07/25/17 1809 07/25/17 1818  NA 133* 135  K 5.5* 5.4*  CL 100* 102  CO2 20*  --   GLUCOSE 239* 242*  BUN 32* 31*  CREATININE 1.22* 1.10*  CALCIUM 9.9  --    Liver Function Tests: Recent Labs  Lab 07/25/17 1809  AST 26  ALT 22  ALKPHOS 71  BILITOT 0.6  PROT 7.7  ALBUMIN 3.9   No results for input(s): LIPASE, AMYLASE in the last 168 hours. No results for input(s): AMMONIA in the last 168 hours. CBC: Recent Labs  Lab 07/25/17 1809 07/25/17 1818  WBC 11.4*  --   NEUTROABS 9.7*  --   HGB 12.5 13.6  HCT 39.0 40.0  MCV 81.1  --   PLT 290  --    Cardiac Enzymes: No results for input(s): CKTOTAL, CKMB, CKMBINDEX, TROPONINI in the last 168 hours. BNP: Invalid input(s): POCBNP CBG: Recent Labs  Lab 07/25/17 1824  GLUCAP 239*   D-Dimer No results for input(s): DDIMER in the last 72 hours. Hgb A1c Recent Labs     07/26/17 0652  HGBA1C 7.7*   Lipid Profile Recent Labs    07/26/17 0652  CHOL 123  HDL 61  LDLCALC 52  TRIG 49  CHOLHDL 2.0   Thyroid function studies No results for input(s): TSH, T4TOTAL, T3FREE, THYROIDAB in the last 72 hours.  Invalid input(s): FREET3 Anemia work up No results for input(s): VITAMINB12, FOLATE, FERRITIN, TIBC, IRON, RETICCTPCT in the last 72 hours. Urinalysis    Component Value Date/Time   COLORURINE YELLOW 07/25/2017  2120   APPEARANCEUR HAZY (A) 07/25/2017 2120   LABSPEC 1.024 07/25/2017 2120   PHURINE 5.0 07/25/2017 2120   GLUCOSEU 50 (A) 07/25/2017 2120   HGBUR NEGATIVE 07/25/2017 2120   BILIRUBINUR NEGATIVE 07/25/2017 2120   BILIRUBINUR n 03/14/2016 1227   KETONESUR 5 (A) 07/25/2017 2120   PROTEINUR 100 (A) 07/25/2017 2120   UROBILINOGEN negative 03/14/2016 1227   NITRITE NEGATIVE 07/25/2017 2120   LEUKOCYTESUR NEGATIVE 07/25/2017 2120    Microbiology Recent Results (from the past 240 hour(s))  MRSA PCR Screening     Status: None   Collection Time: 07/26/17  3:00 AM  Result Value Ref Range Status   MRSA by PCR NEGATIVE NEGATIVE Final    Comment:        The GeneXpert MRSA Assay (FDA approved for NASAL specimens only), is one component of a comprehensive MRSA colonization surveillance program. It is not intended to diagnose MRSA infection nor to guide or monitor treatment for MRSA infections. Performed at [facility], [place] [address] [place], [place] [address]     Time coordinating discharge: Approximately 40 minutes  Patrecia Pour, MD  [facility] 07/26/2017, 3:57 PM Pager [phone]

## 2017-07-26 NOTE — Progress Notes (Signed)
Preliminary notes --Bilateral carotid duplex exam completed. Bilateral vertebral arteries antegrade flow.  Bilateral ICA 1-39% stenosis. Focal and diffused calcified plaque seen bilateral CCA distal to ICA prox.   Lisa Villegas (RDMS RVT) 07/26/17 4:43 PM

## 2017-07-27 LAB — GLUCOSE, CAPILLARY
GLUCOSE-CAPILLARY: 182 mg/dL — AB (ref 65–99)
GLUCOSE-CAPILLARY: 168 mg/dL — AB (ref 65–99)

## 2017-07-27 NOTE — Consult Note (Signed)
            [facility] CM Primary Care Navigator  07/27/2017  Colton Engdahl 1931-02-12 [id number]   Went to see patient at the bedside to identify possible discharge needs but she was already discharged yesterday per staff.  Per MD note, patient presented with confusion more than her baseline (acute metabolic encephalopathy) with significant cerebrovascular atherosclerosis on neuroimaging.  PerInpatient CM note, patient is a resident at [facility] [place] the plan to discharge patient back to the same facility.  Primary care provider's office is listed as providing transition of care (TOC) follow-up.  Patient has discharge instruction to follow-up with primary care provider in 1- 2 weeks and neurology in 6- 8 weeks.    For additional questions please contact:  Edwena Felty A. Chigozie Basaldua, BSN, RN-BC [facility] PRIMARY CARE Navigator Cell: [phone]

## 2017-07-28 ENCOUNTER — Ambulatory Visit: Payer: Medicare Other | Admitting: Podiatry

## 2017-07-31 ENCOUNTER — Inpatient Hospital Stay (HOSPITAL_COMMUNITY)
Admission: EM | Admit: 2017-07-31 | Discharge: 2017-08-03 | DRG: 312 | Disposition: A | Discharge status: Home / Self Care | Payer: Medicare Other | Attending: Internal Medicine | Admitting: Internal Medicine

## 2017-07-31 ENCOUNTER — Encounter (HOSPITAL_COMMUNITY): Payer: Self-pay | Admitting: Nurse Practitioner

## 2017-07-31 ENCOUNTER — Emergency Department (HOSPITAL_COMMUNITY): Payer: Medicare Other

## 2017-07-31 ENCOUNTER — Observation Stay (HOSPITAL_COMMUNITY): Payer: Medicare Other

## 2017-07-31 DIAGNOSIS — R251 Tremor, unspecified: Secondary | ICD-10-CM | POA: Diagnosis present

## 2017-07-31 DIAGNOSIS — Z7984 Long term (current) use of oral hypoglycemic drugs: Secondary | ICD-10-CM

## 2017-07-31 DIAGNOSIS — N183 Chronic kidney disease, stage 3 unspecified: Secondary | ICD-10-CM | POA: Diagnosis present

## 2017-07-31 DIAGNOSIS — I10 Essential (primary) hypertension: Secondary | ICD-10-CM | POA: Diagnosis present

## 2017-07-31 DIAGNOSIS — K2100 Gastro-esophageal reflux disease with esophagitis, without bleeding: Secondary | ICD-10-CM

## 2017-07-31 DIAGNOSIS — R55 Syncope and collapse: Principal | ICD-10-CM | POA: Diagnosis present

## 2017-07-31 DIAGNOSIS — I272 Pulmonary hypertension, unspecified: Secondary | ICD-10-CM | POA: Diagnosis present

## 2017-07-31 DIAGNOSIS — R4182 Altered mental status, unspecified: Secondary | ICD-10-CM

## 2017-07-31 DIAGNOSIS — J9811 Atelectasis: Secondary | ICD-10-CM | POA: Diagnosis not present

## 2017-07-31 DIAGNOSIS — F329 Major depressive disorder, single episode, unspecified: Secondary | ICD-10-CM | POA: Diagnosis present

## 2017-07-31 DIAGNOSIS — Z8673 Personal history of transient ischemic attack (TIA), and cerebral infarction without residual deficits: Secondary | ICD-10-CM

## 2017-07-31 DIAGNOSIS — R404 Transient alteration of awareness: Secondary | ICD-10-CM | POA: Diagnosis not present

## 2017-07-31 DIAGNOSIS — F840 Autistic disorder: Secondary | ICD-10-CM | POA: Diagnosis not present

## 2017-07-31 DIAGNOSIS — Z79899 Other long term (current) drug therapy: Secondary | ICD-10-CM

## 2017-07-31 DIAGNOSIS — Z7982 Long term (current) use of aspirin: Secondary | ICD-10-CM

## 2017-07-31 DIAGNOSIS — E1140 Type 2 diabetes mellitus with diabetic neuropathy, unspecified: Secondary | ICD-10-CM | POA: Diagnosis present

## 2017-07-31 DIAGNOSIS — F419 Anxiety disorder, unspecified: Secondary | ICD-10-CM | POA: Diagnosis not present

## 2017-07-31 DIAGNOSIS — E876 Hypokalemia: Secondary | ICD-10-CM | POA: Diagnosis present

## 2017-07-31 DIAGNOSIS — M8580 Other specified disorders of bone density and structure, unspecified site: Secondary | ICD-10-CM | POA: Diagnosis present

## 2017-07-31 DIAGNOSIS — D509 Iron deficiency anemia, unspecified: Secondary | ICD-10-CM | POA: Diagnosis present

## 2017-07-31 DIAGNOSIS — Z8571 Personal history of Hodgkin lymphoma: Secondary | ICD-10-CM

## 2017-07-31 DIAGNOSIS — K219 Gastro-esophageal reflux disease without esophagitis: Secondary | ICD-10-CM | POA: Diagnosis present

## 2017-07-31 DIAGNOSIS — Z66 Do not resuscitate: Secondary | ICD-10-CM | POA: Diagnosis present

## 2017-07-31 DIAGNOSIS — R627 Adult failure to thrive: Secondary | ICD-10-CM | POA: Diagnosis present

## 2017-07-31 DIAGNOSIS — E559 Vitamin D deficiency, unspecified: Secondary | ICD-10-CM | POA: Diagnosis present

## 2017-07-31 DIAGNOSIS — K209 Esophagitis, unspecified without bleeding: Secondary | ICD-10-CM

## 2017-07-31 DIAGNOSIS — G9340 Encephalopathy, unspecified: Secondary | ICD-10-CM | POA: Diagnosis not present

## 2017-07-31 DIAGNOSIS — R4189 Other symptoms and signs involving cognitive functions and awareness: Secondary | ICD-10-CM | POA: Diagnosis present

## 2017-07-31 DIAGNOSIS — I5032 Chronic diastolic (congestive) heart failure: Secondary | ICD-10-CM | POA: Diagnosis present

## 2017-07-31 DIAGNOSIS — K229 Disease of esophagus, unspecified: Secondary | ICD-10-CM

## 2017-07-31 DIAGNOSIS — E785 Hyperlipidemia, unspecified: Secondary | ICD-10-CM | POA: Diagnosis present

## 2017-07-31 DIAGNOSIS — Z90710 Acquired absence of both cervix and uterus: Secondary | ICD-10-CM

## 2017-07-31 DIAGNOSIS — R569 Unspecified convulsions: Secondary | ICD-10-CM | POA: Diagnosis not present

## 2017-07-31 DIAGNOSIS — I130 Hypertensive heart and chronic kidney disease with heart failure and stage 1 through stage 4 chronic kidney disease, or unspecified chronic kidney disease: Secondary | ICD-10-CM | POA: Diagnosis not present

## 2017-07-31 DIAGNOSIS — E1122 Type 2 diabetes mellitus with diabetic chronic kidney disease: Secondary | ICD-10-CM | POA: Diagnosis present

## 2017-07-31 DIAGNOSIS — I6529 Occlusion and stenosis of unspecified carotid artery: Secondary | ICD-10-CM | POA: Diagnosis present

## 2017-07-31 DIAGNOSIS — Z7902 Long term (current) use of antithrombotics/antiplatelets: Secondary | ICD-10-CM

## 2017-07-31 DIAGNOSIS — E119 Type 2 diabetes mellitus without complications: Secondary | ICD-10-CM

## 2017-07-31 LAB — MAGNESIUM: MAGNESIUM: 1.7 mg/dL (ref 1.7–2.4)

## 2017-07-31 LAB — I-STAT TROPONIN, ED: Troponin i, poc: 0.01 ng/mL (ref 0.00–0.08)

## 2017-07-31 LAB — CBG MONITORING, ED
Glucose-Capillary: 156 mg/dL — ABNORMAL HIGH (ref 65–99)
Glucose-Capillary: 113 mg/dL — ABNORMAL HIGH (ref 65–99)

## 2017-07-31 LAB — COMPREHENSIVE METABOLIC PANEL
ALK PHOS: 75 U/L (ref 38–126)
ALT: 14 U/L (ref 14–54)
AST: 16 U/L (ref 15–41)
Albumin: 3.2 g/dL — ABNORMAL LOW (ref 3.5–5.0)
Anion gap: 12 (ref 5–15)
BILIRUBIN TOTAL: 0.3 mg/dL (ref 0.3–1.2)
BUN: 22 mg/dL — AB (ref 6–20)
CHLORIDE: 103 mmol/L (ref 101–111)
CO2: 24 mmol/L (ref 22–32)
CREATININE: 0.86 mg/dL (ref 0.44–1.00)
Calcium: 9.2 mg/dL (ref 8.9–10.3)
GFR calc Af Amer: >60 mL/min (ref >60)
GFR calc non Af Amer: >60 mL/min (ref >60)
GLUCOSE: 145 mg/dL — AB (ref 65–99)
Potassium: 3.5 mmol/L (ref 3.5–5.1)
SODIUM: 139 mmol/L (ref 135–145)
Total Protein: 6.8 g/dL (ref 6.5–8.1)

## 2017-07-31 LAB — CBC WITH DIFFERENTIAL/PLATELET
Basophils Absolute: 0 10*3/uL (ref 0.0–0.1)
Basophils Relative: 0 %
EOS ABS: 0 10*3/uL (ref 0.0–0.7)
EOS PCT: 0 %
HCT: 36.1 % (ref 36.0–46.0)
Hemoglobin: 11.3 g/dL — ABNORMAL LOW (ref 12.0–15.0)
LYMPHS ABS: 1 10*3/uL (ref 0.7–4.0)
Lymphocytes Relative: 9 %
MCH: 25.2 pg — AB (ref 26.0–34.0)
MCHC: 31.3 g/dL (ref 30.0–36.0)
MCV: 80.6 fL (ref 78.0–100.0)
MONO ABS: 0.5 10*3/uL (ref 0.1–1.0)
Monocytes Relative: 5 %
Neutro Abs: 9.1 10*3/uL — ABNORMAL HIGH (ref 1.7–7.7)
Neutrophils Relative %: 86 %
PLATELETS: 315 10*3/uL (ref 150–400)
RBC: 4.48 MIL/uL (ref 3.87–5.11)
RDW: 16.1 % — AB (ref 11.5–15.5)
WBC: 10.6 10*3/uL — ABNORMAL HIGH (ref 4.0–10.5)

## 2017-07-31 LAB — URINALYSIS, COMPLETE (UACMP) WITH MICROSCOPIC
Bilirubin Urine: NEGATIVE
Glucose, UA: NEGATIVE mg/dL
Hgb urine dipstick: NEGATIVE
Ketones, ur: 5 mg/dL — AB
LEUKOCYTES UA: NEGATIVE
Nitrite: NEGATIVE
PH: 5 (ref 5.0–8.0)
Protein, ur: 100 mg/dL — AB
SPECIFIC GRAVITY, URINE: 1.024 (ref 1.005–1.030)

## 2017-07-31 LAB — PHOSPHORUS: Phosphorus: 4.1 mg/dL (ref 2.5–4.6)

## 2017-07-31 LAB — CK: Total CK: 48 U/L (ref 38–234)

## 2017-07-31 LAB — GLUCOSE, CAPILLARY
GLUCOSE-CAPILLARY: 214 mg/dL — AB (ref 65–99)
Glucose-Capillary: 107 mg/dL — ABNORMAL HIGH (ref 65–99)

## 2017-07-31 MED ORDER — HYDRALAZINE HCL 25 MG PO TABS
25.0000 mg | ORAL_TABLET | Freq: Once | ORAL | Status: AC
  Administered 2017-07-31: 25 mg via ORAL
  Filled 2017-07-31: qty 1

## 2017-07-31 MED ORDER — INSULIN ASPART 100 UNIT/ML ~~LOC~~ SOLN: 0.0000 [IU] | Freq: Every day | SUBCUTANEOUS | Status: DC

## 2017-07-31 MED ORDER — ADULT MULTIVITAMIN LIQUID CH
5.0000 mL | Freq: Every day | ORAL | Status: DC
  Administered 2017-07-31 – 2017-08-03 (×4): 5 mL via ORAL
  Filled 2017-07-31 (×5): qty 15

## 2017-07-31 MED ORDER — ASPIRIN EC 81 MG PO TBEC
81.0000 mg | DELAYED_RELEASE_TABLET | Freq: Every day | ORAL | Status: DC
  Administered 2017-07-31 – 2017-08-03 (×4): 81 mg via ORAL
  Filled 2017-07-31 (×4): qty 1

## 2017-07-31 MED ORDER — VERAPAMIL HCL ER 120 MG PO TBCR
120.0000 mg | EXTENDED_RELEASE_TABLET | Freq: Every day | ORAL | Status: DC
  Administered 2017-07-31 – 2017-08-02 (×3): 120 mg via ORAL
  Filled 2017-07-31 (×4): qty 1

## 2017-07-31 MED ORDER — CLOPIDOGREL BISULFATE 75 MG PO TABS
75.0000 mg | ORAL_TABLET | Freq: Every day | ORAL | Status: DC
  Administered 2017-07-31 – 2017-08-03 (×4): 75 mg via ORAL
  Filled 2017-07-31 (×4): qty 1

## 2017-07-31 MED ORDER — SODIUM CHLORIDE 0.9 % IV SOLN
INTRAVENOUS | Status: DC
Start: 2017-07-31 — End: 2017-08-01
  Administered 2017-07-31 – 2017-08-01 (×2): via INTRAVENOUS

## 2017-07-31 MED ORDER — INSULIN ASPART 100 UNIT/ML ~~LOC~~ SOLN
0.0000 [IU] | Freq: Three times a day (TID) | SUBCUTANEOUS | Status: DC
  Administered 2017-08-01 – 2017-08-03 (×4): 1 [IU] via SUBCUTANEOUS
  Administered 2017-08-03: 2 [IU] via SUBCUTANEOUS

## 2017-07-31 MED ORDER — INTEGRA F 125-1 MG PO CAPS: 1.0000 | ORAL_CAPSULE | Freq: Every day | ORAL | Status: DC

## 2017-07-31 MED ORDER — FE FUMARATE-B12-VIT C-FA-IFC PO CAPS
1.0000 | ORAL_CAPSULE | Freq: Every day | ORAL | Status: AC
  Administered 2017-07-31: 1 via ORAL
  Filled 2017-07-31 (×2): qty 1

## 2017-07-31 MED ORDER — SODIUM CHLORIDE 0.9% FLUSH
3.0000 mL | Freq: Two times a day (BID) | INTRAVENOUS | Status: DC
  Administered 2017-07-31 – 2017-08-03 (×4): 3 mL via INTRAVENOUS

## 2017-07-31 MED ORDER — ATORVASTATIN CALCIUM 20 MG PO TABS
20.0000 mg | ORAL_TABLET | Freq: Every evening | ORAL | Status: DC
  Administered 2017-07-31 – 2017-08-02 (×3): 20 mg via ORAL
  Filled 2017-07-31 (×4): qty 1

## 2017-07-31 MED ORDER — ENOXAPARIN SODIUM 40 MG/0.4ML ~~LOC~~ SOLN
40.0000 mg | SUBCUTANEOUS | Status: DC
  Administered 2017-07-31 – 2017-08-02 (×3): 40 mg via SUBCUTANEOUS
  Filled 2017-07-31 (×3): qty 0.4

## 2017-07-31 MED ORDER — MAGNESIUM CHLORIDE 64 MG PO TBEC
1.0000 | DELAYED_RELEASE_TABLET | Freq: Two times a day (BID) | ORAL | Status: DC
  Administered 2017-07-31 – 2017-08-03 (×6): 64 mg via ORAL
  Filled 2017-07-31 (×7): qty 1

## 2017-07-31 MED ORDER — METFORMIN HCL 500 MG PO TABS
1000.0000 mg | ORAL_TABLET | Freq: Two times a day (BID) | ORAL | Status: DC
  Administered 2017-07-31 – 2017-08-03 (×5): 1000 mg via ORAL
  Filled 2017-07-31 (×5): qty 2

## 2017-07-31 MED ORDER — PANTOPRAZOLE SODIUM 40 MG PO TBEC
40.0000 mg | DELAYED_RELEASE_TABLET | Freq: Every day | ORAL | Status: DC
  Administered 2017-08-01 – 2017-08-03 (×3): 40 mg via ORAL
  Filled 2017-07-31 (×3): qty 1

## 2017-07-31 NOTE — ED Triage Notes (Signed)
PT arrived from assisted living facility, staff reported LOC when attempted to get out of bed this morning ; NO KO;  CBG by ems :170 Bp: 148/77 HR:70 with PVS O2: 99 RA

## 2017-07-31 NOTE — ED Notes (Signed)
Per POA [name], she is requesting patient go to a skilled nursing facility/ rehab post discharge

## 2017-07-31 NOTE — ED Notes (Signed)
Patient transported to CT 

## 2017-07-31 NOTE — H&P (Signed)
History and Physical    Lisa Villegas OIB:[id number] DOB: Oct 24, 1930 DOA: 07/31/2017  **Will admit patient based on the expectation that the patient will need hospitalization/ hospital care that crosses at least 2 midnights  PCP: [name], MD   Attending physician: Lorin Mercy  Patient coming from/Resides with: [facility] at [facility]  Chief Complaint: Syncopal episode  HPI: Lisa Villegas is a 84 y.o. female with medical history significant for reported autism, hypertension, stage III chronic kidney disease, dyslipidemia, diabetes on oral agents and chronic tremor.  Patient was discharged on 5/1 after an admission for altered mentation.  CT of the head as well as echo were unrevealing.  No evidence of infectious etiology.  Neurologist was verbally consulted and recommended DAPT concerns of possible TIA symptoms.  Patient return to the ER today after having a witnessed syncopal event while ambulating with staff at facility.  Was reported that while ambulating to the bathroom she went "limp" and that her eyes rolled to the back of her head and to the right.  She apparently had some left-sided drooping of her mouth.  She was not arousable for at least 1 minute.  No postictal phase.  No witnessed seizure activity.  No apnea.  According to facility staff since returning to the hospital she has had poor oral intake.  CT of the head as well as chest x-ray completed upon arrival to ED were unremarkable for acute causes.  ED Course:  Vital Signs: BP (!) 169/54   Pulse (!) 57   Temp 98.2 F (36.8 C) (Oral)   Resp (!) 22   SpO2 95%  CT head/chest x-ray: As above Lab data: Sodium 139, potassium 3.5, chloride 103, CO2 24, glucose 145, BUN 22, creatinine 0.86, albumin 3.2, LFTs unremarkable, troponin unremarkable, white count 10,600 with neutrophils 86% was 9.1%, hemoglobin 11.3, platelets 315,000, urinalysis cloudy with 5 ketones and 100 protein, 2-5 WBCs.  Review of Systems:    **Unable to obtain from patient given baseline altered mental status.  History obtained from the medical record   Past Medical History:  Diagnosis Date  . Anemia   . Anxiety and depression    per med rec  . 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 Autism   . Chronic CHF (congestive heart failure) (HCC)    per med rec  . Chronic kidney disease (CKD)    stage 3 per chart in 09/2015  . Diabetes mellitus without complication ([place])   . Diabetic neuropathy ([place])   . History of thyroid nodule    nontoxic goiter per med rec  . Hodgkin disease ([place])    in 38  . Hodgkin's disease ([place])   . HTN (hypertension), benign   . Hyperlipidemia   . Incontinence   . Lack of sensation   . Osteopenia    per med rec  . Poor balance   . Tubular adenoma of colon 01/2017  . Vitamin D deficiency     Past Surgical History:  Procedure Laterality Date  . ABDOMINAL HYSTERECTOMY    . BREAST LUMPECTOMY Left     Social History   Socioeconomic History  . Marital status: Single    Spouse name: Not on file  . Number of children: 0  . Years of education: Not on file  . Highest education level: Not on file  Occupational History  . Not on file  Social Needs  . Financial resource strain: Not on file  . Food insecurity:    Worry: Not  on file    Inability: Not on file  . Transportation needs:    Medical: Not on file    Non-medical: Not on file  Tobacco Use  . Smoking status: Never Smoker  . Smokeless tobacco: Never Used  Substance and Sexual Activity  . Alcohol use: No  . Drug use: No  . Sexual activity: Not on file  Lifestyle  . Physical activity:    Days per week: Not on file    Minutes per session: Not on file  . Stress: Not on file  Relationships  . Social connections:    Talks on phone: Not on file    Gets together: Not on file    Attends religious service: Not on file    Active member of club or organization: Not on file    Attends meetings of clubs or organizations: Not on file     Relationship status: Not on file  . Intimate partner violence:    Fear of current or ex partner: Not on file    Emotionally abused: Not on file    Physically abused: Not on file    Forced sexual activity: Not on file  Other Topics Concern  . Not on file  Social History Narrative   Lives at [facility], single, has a PhD in Math    Mobility: Ambulates with assistance Work history: Not obtained   Allergies  Allergen Reactions  . Penicillins Rash    Has patient had a PCN reaction causing immediate rash, facial/tongue/throat swelling, SOB or lightheadedness with hypotension: yes Has patient had a PCN reaction causing severe rash involving mucus membranes or skin necrosis: unk Has patient had a PCN reaction that required hospitalization: unk Has patient had a PCN reaction occurring within the last 10 years: unk If all of the above answers are "NO", then may proceed with Cephalosporin use.     Family History  Problem Relation Age of Onset  . CAD Mother        died at 53 yo  . Atrial fibrillation Mother        and tachycardia  . CAD Father        died at 47 yo  . Atrial fibrillation Father        and tachycardia      Prior to Admission medications   Medication Sig Start Date End Date Taking? Authorizing Provider  aspirin 81 MG tablet Take 1 tablet (81 mg total) by mouth daily. 07/26/17  Yes Patrecia Pour, MD  atorvastatin (LIPITOR) 20 MG tablet Take 1 tablet (20 mg total) by mouth every evening. 10/20/16  Yes Henson, Vickie L, NP-C  calcium carbonate (TUMS EX) 750 MG chewable tablet Chew 1 tablet by mouth daily.   Yes [provider]  Cetylpyridinium Chloride (CREST PRO-HEALTH MT) Use as directed 1 Applicatorful in the mouth or throat 2 (two) times daily.    Yes [provider]  Cholecalciferol (VITAMIN D3) 2000 UNITS TABS Take 1 tablet by mouth daily.    Yes [provider]  clopidogrel (PLAVIX) 75 MG tablet Take 1 tablet (75 mg total)  by mouth daily. 07/26/17  Yes Patrecia Pour, MD  Fe Fum-FePoly-FA-Vit C-Vit B3 (INTEGRA F) 125-1 MG CAPS Take 1 capsule by mouth daily.   Yes [provider]  ferrous sulfate 325 (65 FE) MG tablet Take 325 mg by mouth daily.    Yes [provider]  fesoterodine (TOVIAZ) 8 MG TB24 tablet TAKE 1 TAB BY  MOUTH EVERY DAY.  DO NOT CRUSH OR CHEW . 10/25/16  Yes Denita Lung, MD  hydrALAZINE (APRESOLINE) 25 MG tablet Take 1 tablet (25 mg total) by mouth 3 (three) times daily as needed. Hydralazine 25 mg for systolic blood pressure 324 or greater and diastolic pressure 85 or greater. 05/12/17  Yes Charlesetta Shanks, MD  magnesium chloride (SLOW-MAG) 64 MG TBEC SR tablet Take 1 tablet by mouth 2 (two) times daily.    Yes [provider]  metFORMIN (GLUCOPHAGE) 500 MG tablet Take 1,000 mg by mouth 2 (two) times daily.    Yes [provider]  Multiple Vitamins-Minerals (CERTA-VITE PO) Take 1 tablet by mouth daily.    Yes [provider]  Nutritional Supplements (GLUCERNA PO) Take 1 Can by mouth 2 (two) times daily.    Yes [provider]  omeprazole (PRILOSEC) 40 MG capsule Take by mouth 40 mg bid for 3 months,then omeprazole 40 mg every a.m. Patient taking differently: Take 40 mg by mouth daily.  02/01/17  Yes [name], MD  Sodium Fluoride (PREVIDENT 5000 BOOSTER PLUS DT) Place 1 application onto teeth every evening.    Yes [provider]  verapamil (CALAN-SR) 120 MG CR tablet Take 120 mg by mouth at bedtime.   Yes [provider]    Physical Exam: Vitals:   07/31/17 1200 07/31/17 1230 07/31/17 1330 07/31/17 1400  BP: (!) 141/73 (!) 175/80 (!) 170/77 (!) 169/54  Pulse: 70 76 71 (!) 57  Resp: 16 20 17  (!) 22  Temp:      TempSrc:      SpO2: 97% 99% 96% 95%      Constitutional: NAD, calm, comfortable Eyes: PERRL, lids and conjunctivae normal ENMT: Mucous membranes are very dry. Posterior pharynx clear of any exudate or  lesions.appears to have dentures in place. Neck: normal, supple, no masses, no thyromegaly Respiratory: clear to auscultation bilaterally, no wheezing, no crackles. Normal respiratory effort. No accessory muscle use.  Cardiovascular: Regular rate and rhythm, no murmurs / rubs / gallops. No extremity edema. 2+ pedal pulses. No carotid bruits.  Abdomen: no tenderness, no masses palpated. No hepatosplenomegaly. Bowel sounds positive.  Musculoskeletal: no clubbing / cyanosis. No joint deformity upper and lower extremities. Good ROM, no contractures. Normal muscle tone.  Skin: no rashes, lesions, ulcers. No induration Neurologic: CN 2-12 grossly intact. Sensation intact, DTR normal. Strength 5/5 x all 4 extremities.  Psychiatric: Pleasantly confused.  Alert and oriented x name. Normal mood.    Labs on Admission: I have personally reviewed following labs and imaging studies  CBC: Recent Labs  Lab 07/25/17 1809 07/25/17 1818 07/31/17 0853  WBC 11.4*  --  10.6*  NEUTROABS 9.7*  --  9.1*  HGB 12.5 13.6 11.3*  HCT 39.0 40.0 36.1  MCV 81.1  --  80.6  PLT 290  --  401   Basic Metabolic Panel: Recent Labs  Lab 07/25/17 1809 07/25/17 1818 07/31/17 0853  NA 133* 135 139  K 5.5* 5.4* 3.5  CL 100* 102 103  CO2 20*  --  24  GLUCOSE 239* 242* 145*  BUN 32* 31* 22*  CREATININE 1.22* 1.10* 0.86  CALCIUM 9.9  --  9.2   GFR: Estimated Creatinine Clearance: 42.5 mL/min (by C-G formula based on SCr of 0.86 mg/dL). Liver Function Tests: Recent Labs  Lab 07/25/17 1809 07/31/17 0853  AST 26 16  ALT 22 14  ALKPHOS 71 75  BILITOT 0.6 0.3  PROT 7.7 6.8  ALBUMIN 3.9 3.2*   No results for input(s): LIPASE, AMYLASE in the last 168 hours. No results for input(s): AMMONIA in the last 168 hours. Coagulation Profile: Recent Labs  Lab 07/25/17 1809  INR 1.04   Cardiac Enzymes: No results for input(s): CKTOTAL, CKMB, CKMBINDEX, TROPONINI in the last 168 hours. BNP (last 3 results) No  results for input(s): PROBNP in the last 8760 hours. HbA1C: No results for input(s): HGBA1C in the last 72 hours. CBG: Recent Labs  Lab 07/26/17 0131 07/26/17 0611 07/26/17 1713 07/26/17 2104 07/31/17 1112  GLUCAP 182* 168* 130* 114* 156*   Lipid Profile: No results for input(s): CHOL, HDL, LDLCALC, TRIG, CHOLHDL, LDLDIRECT in the last 72 hours. Thyroid Function Tests: No results for input(s): TSH, T4TOTAL, FREET4, T3FREE, THYROIDAB in the last 72 hours. Anemia Panel: No results for input(s): VITAMINB12, FOLATE, FERRITIN, TIBC, IRON, RETICCTPCT in the last 72 hours. Urine analysis:    Component Value Date/Time   COLORURINE YELLOW 07/31/2017 1253   APPEARANCEUR CLOUDY (A) 07/31/2017 1253   LABSPEC 1.024 07/31/2017 1253   PHURINE 5.0 07/31/2017 1253   GLUCOSEU NEGATIVE 07/31/2017 1253   HGBUR NEGATIVE 07/31/2017 1253   [place] 07/31/2017 1253   BILIRUBINUR n 03/14/2016 1227   KETONESUR 5 (A) 07/31/2017 1253   PROTEINUR 100 (A) 07/31/2017 1253   UROBILINOGEN negative 03/14/2016 1227   NITRITE NEGATIVE 07/31/2017 1253   LEUKOCYTESUR NEGATIVE 07/31/2017 1253   Sepsis Labs: @LABRCNTIP (procalcitonin:4,lacticidven:4) ) Recent Results (from the past 240 hour(s))  MRSA PCR Screening     Status: None   Collection Time: 07/26/17  3:00 AM  Result Value Ref Range Status   MRSA by PCR NEGATIVE NEGATIVE Final    Comment:        The GeneXpert MRSA Assay (FDA approved for NASAL specimens only), is one component of a comprehensive MRSA colonization surveillance program. It is not intended to diagnose MRSA infection nor to guide or monitor treatment for MRSA infections. Performed at [facility], [place] [address], [place], [place] [address]      Radiological Exams on Admission: Dg Chest 2 View  Result Date: 07/31/2017 CLINICAL DATA:  Loss of consciousness this morning, history CHF, diabetes mellitus, hypertension, stage III chronic kidney disease EXAM: CHEST -  2 VIEW COMPARISON:  Portable exam of 07/26/2017 FINDINGS: Rotated to the LEFT. Normal heart size, mediastinal contours, and pulmonary vascularity. Atherosclerotic calcifications aorta. Bibasilar atelectasis. Lungs otherwise clear. No pleural effusion or pneumothorax. Bones demineralized. IMPRESSION: Bibasilar atelectasis. Electronically Signed   By: Lavonia Dana M.D.   On: 07/31/2017 10:34   Ct Head Wo Contrast  Result Date: 07/31/2017 CLINICAL DATA:  Seizure, nontraumatic. EXAM: CT HEAD WITHOUT CONTRAST TECHNIQUE: Contiguous axial images were obtained from the base of the skull through the vertex without intravenous contrast. COMPARISON:  07/25/2017 head CT. FINDINGS: Brain: Stable tiny left basal ganglia lacune. No evidence of parenchymal hemorrhage or extra-axial fluid collection. No mass lesion, mass effect, or midline shift. No CT evidence of acute infarction. Generalized cerebral volume loss. Nonspecific mild subcortical and periventricular white matter hypodensity, most in keeping with chronic small vessel ischemic change. No ventriculomegaly. Vascular: No acute abnormality. Skull: No evidence of calvarial fracture. Sinuses/Orbits: Minimal partial opacification of the left sphenoid sinus. No fluid levels. Other:  The mastoid air cells are unopacified. IMPRESSION: 1.  No evidence of acute intracranial abnormality. 2. Generalized cerebral volume loss, stable tiny left basal ganglia lacune and mild chronic small vessel ischemic changes in the cerebral white  matter. Electronically Signed   By: Ilona Sorrel M.D.   On: 07/31/2017 10:28    EKG: (Independently reviewed) sinus rhythm with ventricular rate 67 bpm, QTC 473 ms, unifocal PVC, normal R wave rotation, nonspecific ST changes in inferior lateral leads consistent with early repolarization  Assessment/Plan Principal Problem:   Syncope/recent TIA evaluation -Presents with witnessed syncopal episode with ambulation -Orthostatic vital signs normal -EKG  and telemetry since arrival normal without any tachycardia arrhythmia -No tonic-clonic seizure activity but for completeness of evaluation obtain EEG -Echocardiogram was completed several days ago during TIA admission -Continue TIA prophylaxis with aspirin and Plavix as recommended by neurology during previous admission -Neurological checks every 4 hours -Given underlying diabetes patient may have a degree of vasomotor instability; she is not on any offending medications and her blood pressure is actually hypertensive  Active Problems:   Uncontrolled hypertension -Continue preadmission ostium channel blocker -On hydralazine prn prior to arrival-May need to change to scheduled dosing    FTT (failure to thrive) in adult -Patient has had poor oral intake reported since discharge from recent hospitalization -Nutrition consultation -SLP evaluation -PT/OT evaluation in the event patient needs to transition to SNF level of care -Gentle IV fluid hydration    Chronic diastolic heart failure, NYHA class 2  -Patient was noted with grade 2 diastolic dysfunction on recent echocardiogram with hyperdynamic LVEF.  No apparent LVH and no valvular dysfunction.  She was found to have mild pulmonary hypertension -Compensated -Not on diuretics prior to admission -Not on ACE I/ARB secondary to CKD -Not on beta-blocker -Weight, strict I's/O    Diabetes mellitus, controlled  -Continue metformin -Follow CBGs and provide SSI    CKD (chronic kidney disease) stage 3, GFR 30-59 ml/min  -Renal function stable and at baseline current GFR greater than 60 with GFR on 06/3038 -BUN stable    History of Hodgkin's disease    Tremor of right hand/chronic &benign    **Additional lab, imaging and/or diagnostic evaluation at discretion of supervising physician  DVT prophylaxis: Lovenox Code Status: DO NOT RESUSCITATE Family Communication: No family at bedside Disposition Plan: TBD-potential he may transition from  ALF to SNF level of care Consults called: None    Bronco Mcgrory L. ANP-BC [facility] Pager [phone]   If 7PM-7AM, please contact night-coverage [email or web address] Password [email or web address]  07/31/2017, 2:28 PM

## 2017-07-31 NOTE — ED Notes (Signed)
Urine culture sent to lab with UA. 

## 2017-07-31 NOTE — Progress Notes (Signed)
EEG completed; results pending.    

## 2017-07-31 NOTE — ED Provider Notes (Signed)
[facility] EMERGENCY DEPARTMENT Provider Note   CSN: [id number] Arrival date & time: 07/31/17  5638     History   Chief Complaint Chief Complaint  Patient presents with  . Loss of Consciousness    HPI Lisa Villegas is a 84 y.o. female.  HPI 84 year old Caucasian female with a past medical history significant for autism, hypertension, hyperlipidemia, diabetes, GERD, depression, anxiety, CKD, Hodgkin's disease in remission, diastolic CHF, iron deficiency anemia that presents to the emergency department today by EMS from nursing facility for altered mental status and possible syncope.  Spoke with nursing facility who states that while they were trying to get patient ready for the day of this morning they were walking her to the bathroom and she went "limp".  The nursing staff states that her eyes rolled the back of her head to the right.  The also state that she had some left-sided drooping of her mouth.  They report that she was out for approximately 1 minute then came to.  States that she never stopped breathing.  They were able to assist patient back to bed.  They state that she was a little bit more confused than normal.  States that since arriving back to the facility from the hospital she has not been doing well.  States that she has not been eating.  Patient denies any associated complaints at this time however she is altered at baseline.  Facility states that she knows that she is" a facility" in her name but is not oriented to much more than that.  Of note patient was recently admitted to the hospital on 4/30 and discharged on 5/1 for altered mental status.  At that time she had a negative work-up that included a CT scan and MRI.  Neurology recommended DAPT x3 weeks then aspirin alone.  Otherwise work-up was reassuring.    There is a Level V caveat due to medical history and ams.       Past Medical History:  Diagnosis Date  . Anemia   . Anxiety and  depression    per med rec  . 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 Autism   . Chronic CHF (congestive heart failure) (HCC)    per med rec  . Chronic kidney disease (CKD)    stage 3 per chart in 09/2015  . Diabetes mellitus without complication ([place])   . Diabetic neuropathy ([place])   . History of thyroid nodule    nontoxic goiter per med rec  . Hodgkin disease ([place])    in 27  . Hodgkin's disease ([place])   . HTN (hypertension), benign   . Hyperlipidemia   . Incontinence   . Lack of sensation   . Osteopenia    per med rec  . Poor balance   . Tubular adenoma of colon 01/2017  . Vitamin D deficiency     Patient Active Problem List   Diagnosis Date Noted  . Hypertensive urgency 07/26/2017  . Acute metabolic encephalopathy 93/73/4287  . GERD (gastroesophageal reflux disease) 07/25/2017  . Hyperkalemia 07/25/2017  . Anemia 11/30/2016  . Anxiety and depression   . Autism   . History of thyroid nodule   . Hodgkin disease ([place])   . HTN (hypertension), benign   . Incontinence   . Lack of sensation   . Poor balance   . CKD (chronic kidney disease), stage III ([place])   . Osteopenia   . Vitamin D deficiency   . Follow-up examination for injury  02/08/2016  . Type II diabetes mellitus with renal manifestations ([place]) 01/01/2016  . Hypertension 01/01/2016  . Incontinence of feces 01/01/2016  . Urinary incontinence without sensory awareness 01/01/2016  . Hyperlipidemia 01/01/2016    Past Surgical History:  Procedure Laterality Date  . ABDOMINAL HYSTERECTOMY    . BREAST LUMPECTOMY Left      OB History   None      Home Medications    Prior to Admission medications   Medication Sig Start Date End Date Taking? Authorizing Provider  aspirin 81 MG tablet Take 1 tablet (81 mg total) by mouth daily. 07/26/17   Patrecia Pour, MD  atorvastatin (LIPITOR) 20 MG tablet Take 1 tablet (20 mg total) by mouth every evening. 10/20/16   Henson, Vickie L, NP-C  calcium carbonate (TUMS EX) 750 MG chewable  tablet Chew 1 tablet by mouth daily.    [provider]  Cetylpyridinium Chloride (CREST PRO-HEALTH MT) Use as directed 1 Applicatorful in the mouth or throat 2 (two) times daily.     [provider]  Cholecalciferol (VITAMIN D3) 2000 UNITS TABS Take 1 tablet by mouth daily.     [provider]  clopidogrel (PLAVIX) 75 MG tablet Take 1 tablet (75 mg total) by mouth daily. 07/26/17   Patrecia Pour, MD  Fe Fum-FePoly-FA-Vit C-Vit B3 (INTEGRA F) 125-1 MG CAPS Take 1 capsule by mouth daily.    [provider]  ferrous sulfate 325 (65 FE) MG tablet Take 325 mg by mouth daily.     [provider]  fesoterodine (TOVIAZ) 8 MG TB24 tablet TAKE 1 TAB BY MOUTH EVERY DAY.  DO NOT CRUSH OR CHEW . 10/25/16   Denita Lung, MD  hydrALAZINE (APRESOLINE) 25 MG tablet Take 1 tablet (25 mg total) by mouth 3 (three) times daily as needed. Hydralazine 25 mg for systolic blood pressure 425 or greater and diastolic pressure 85 or greater. 05/12/17   Charlesetta Shanks, MD  magnesium chloride (SLOW-MAG) 64 MG TBEC SR tablet Take 1 tablet by mouth 2 (two) times daily.     [provider]  metFORMIN (GLUCOPHAGE) 500 MG tablet Take 1,000 mg by mouth 2 (two) times daily.     [provider]  Multiple Vitamins-Minerals (CERTA-VITE PO) Take 1 tablet by mouth daily.     [provider]  Nutritional Supplements (GLUCERNA PO) Take 1 Can by mouth 2 (two) times daily.     [provider]  omeprazole (PRILOSEC) 40 MG capsule Take by mouth 40 mg bid for 3 months,then omeprazole 40 mg every a.m. Patient taking differently: Take 40 mg by mouth daily.  02/01/17   [name], MD  Sodium Fluoride (PREVIDENT 5000 BOOSTER PLUS DT) Place 1 application onto teeth every evening.     [provider]    Family History Family History  Problem Relation Age of Onset  . CAD Mother        died at 103 yo  . Atrial fibrillation Mother        and tachycardia  .  CAD Father        died at 75 yo  . Atrial fibrillation Father        and tachycardia    Social History Social History   Tobacco Use  . Smoking status: Never Smoker  . Smokeless tobacco: Never Used  Substance Use Topics  . Alcohol use: No  . Drug use: No     Allergies   Penicillins  Review of Systems Review of Systems  Unable to perform ROS: Mental status change     Physical Exam Updated Vital Signs BP (!) 176/73   Pulse 65   Temp 98.2 F (36.8 C) (Oral)   Resp 17   SpO2 100%   Physical Exam  Constitutional: She appears well-developed and well-nourished.  Non-toxic appearance. No distress.  HENT:  Head: Normocephalic and atraumatic.  Nose: Nose normal.  Mouth/Throat: Oropharynx is clear and moist.  Eyes: Pupils are equal, round, and reactive to light. Right eye exhibits no discharge. Left eye exhibits no discharge.    Conjunctive are moderately injected bilaterally.  Neck: Normal range of motion. Neck supple.  Cardiovascular: Normal rate, regular rhythm, normal heart sounds and intact distal pulses. Exam reveals no gallop and no friction rub.  No murmur heard. Pulmonary/Chest: Effort normal and breath sounds normal. No stridor. No respiratory distress. She has no wheezes. She has no rales. She exhibits no tenderness.  Abdominal: Soft. Normal appearance and bowel sounds are normal. There is no tenderness. There is no rigidity, no rebound, no guarding and no CVA tenderness.  Musculoskeletal: Normal range of motion. She exhibits no edema or tenderness.  Lymphadenopathy:    She has no cervical adenopathy.  Neurological: She is alert.  She is awake and oriented to person and place but not time.  Cranial nerves are grossly intact.  Speech is normal.  She follows commands rather poorly, but motor strength in upper extremities appears to be 5/5.  Lower extremities has 5/5 strength, .  No Babinski reflex is seen.  Resting tremor noted involving the right hand.   Skin:  Skin is warm and dry. Capillary refill takes less than 2 seconds.  Psychiatric: Her behavior is normal. Judgment and thought content normal.  Nursing note and vitals reviewed.         ED Treatments / Results  Labs (all labs ordered are listed, but only abnormal results are displayed) Labs Reviewed  COMPREHENSIVE METABOLIC PANEL - Abnormal; Notable for the following components:      Result Value   Glucose, Bld 145 (*)    BUN 22 (*)    Albumin 3.2 (*)    All other components within normal limits  CBC WITH DIFFERENTIAL/PLATELET - Abnormal; Notable for the following components:   WBC 10.6 (*)    Hemoglobin 11.3 (*)    MCH 25.2 (*)    RDW 16.1 (*)    Neutro Abs 9.1 (*)    All other components within normal limits  URINALYSIS, COMPLETE (UACMP) WITH MICROSCOPIC - Abnormal; Notable for the following components:   APPearance CLOUDY (*)    Ketones, ur 5 (*)    Protein, ur 100 (*)    Bacteria, UA FEW (*)    All other components within normal limits  CBG MONITORING, ED - Abnormal; Notable for the following components:   Glucose-Capillary 156 (*)    All other components within normal limits  CK  I-STAT TROPONIN, ED    EKG EKG Interpretation  Date/Time:  Monday Jul 31 2017 08:24:52 EDT Ventricular Rate:  67 PR Interval:    QRS Duration: 95 QT Interval:  448 QTC Calculation: 473 R Axis:   78 Text Interpretation:  Sinus rhythm Multiple ventricular premature complexes Nonspecific T abnormalities, lateral leads NO STEMI Otherwise no significant change Confirmed by Addison Lank [id number]) on 07/31/2017 9:18:30 AM   Radiology Dg Chest 2 View  Result Date: 07/31/2017 CLINICAL DATA:  Loss of consciousness this morning, history  CHF, diabetes mellitus, hypertension, stage III chronic kidney disease EXAM: CHEST - 2 VIEW COMPARISON:  Portable exam of 07/26/2017 FINDINGS: Rotated to the LEFT. Normal heart size, mediastinal contours, and pulmonary vascularity. Atherosclerotic calcifications  aorta. Bibasilar atelectasis. Lungs otherwise clear. No pleural effusion or pneumothorax. Bones demineralized. IMPRESSION: Bibasilar atelectasis. Electronically Signed   By: Lavonia Dana M.D.   On: 07/31/2017 10:34   Ct Head Wo Contrast  Result Date: 07/31/2017 CLINICAL DATA:  Seizure, nontraumatic. EXAM: CT HEAD WITHOUT CONTRAST TECHNIQUE: Contiguous axial images were obtained from the base of the skull through the vertex without intravenous contrast. COMPARISON:  07/25/2017 head CT. FINDINGS: Brain: Stable tiny left basal ganglia lacune. No evidence of parenchymal hemorrhage or extra-axial fluid collection. No mass lesion, mass effect, or midline shift. No CT evidence of acute infarction. Generalized cerebral volume loss. Nonspecific mild subcortical and periventricular white matter hypodensity, most in keeping with chronic small vessel ischemic change. No ventriculomegaly. Vascular: No acute abnormality. Skull: No evidence of calvarial fracture. Sinuses/Orbits: Minimal partial opacification of the left sphenoid sinus. No fluid levels. Other:  The mastoid air cells are unopacified. IMPRESSION: 1.  No evidence of acute intracranial abnormality. 2. Generalized cerebral volume loss, stable tiny left basal ganglia lacune and mild chronic small vessel ischemic changes in the cerebral white matter. Electronically Signed   By: Ilona Sorrel M.D.   On: 07/31/2017 10:28    Procedures Procedures (including critical care time)  Medications Ordered in ED Medications - No data to display   Initial Impression / Assessment and Plan / ED Course  I have reviewed the triage vital signs and the nursing notes.  Pertinent labs & imaging results that were available during my care of the patient were reviewed by me and considered in my medical decision making (see chart for details).     Patient presents to the emergency department today from nursing facility for evaluation following a syncopal episode.  Denies any  trauma and was assisted back to bed.  I discussed with the facility who states that she was slightly more confused than normal today however her baseline is to person and place but not time.  Patient overall well-appearing and nontoxic.  Vital signs are overall reassuring.  She is mildly hypertensive but has not had her morning hypertensive medications.  She is not febrile or tachycardic.  Heart regular rate and rhythm.  Lungs clear to auscultation bilaterally.  I do not appreciate any focal neuro deficit on my exam however patient is disoriented at baseline.  No appreciable weakness on exam.  Focal abdominal tenderness.  Bowel sounds present in all 4 quadrants.  Lab work shows no leukocytosis.  Hemoglobin appears the patient's baseline.  Normal kidney function.  Elect lites are reassuring.  UA shows no signs of infection.  Negative troponin.  Chest x-ray shows no signs of focal infiltrate as reviewed by myself.  EKG appears motor prior tracings without any acute findings noted.  CT scan of head was obtained that showed no acute abnormalities.   Patient did have MRI on 4/1 that showed chronic changes.  Doubt CVA as patient has no focal neuro deficit on my examination.  No signs of acute metabolic derangements.  Patient does not meet Sirs or sepsis criteria.  Given patient's syncope with history of CHF patient would benefit from hospital admission for further work-up.  Denies any complaints at this time.  She is alert oriented x2.  Vital signs remained reassuring.  Updated on plan of care.  Patient was discussed and seen by my attending who is agreeable the above plan.  Spoke with  Ebony Hail with hospital medicine who agrees to admission will see patient in the ED and place admission orders.   Final Clinical Impressions(s) / ED Diagnoses   Final diagnoses:  Syncope, unspecified syncope type    ED Discharge Orders    None       Aaron Edelman 07/31/17 1357    Fatima Blank, MD 08/02/17 [phone]

## 2017-07-31 NOTE — ED Notes (Signed)
CBG: 113, RN notified.

## 2017-07-31 NOTE — ED Notes (Signed)
Orthostatic VS assessed on pt while laying down and sitting up. Did not attempt to have pt stand up due to pts level of alertness.

## 2017-07-31 NOTE — Procedures (Signed)
  ELECTROENCEPHALOGRAM REPORT  Date of Study: 07/31/17  Patient's Name: Lisa Villegas MRN: [id number] Date of Birth: 03/03/1931  Referring Provider: Erin Hearing, NP  Clinical History: Luis Sami is a 84 y.o. female with medical history significant for reported autism, hypertension, stage III chronic kidney disease, dyslipidemia, diabetes on oral agents and chronic tremor. Patient was discharged on 5/1 after an admission for altered mentation. CT of the head as well as echo were unrevealing. Now admitted for syncopal episode.  Head CT negative.  Neurological exam notable for confusion, o/w non-focal.   Medications: Scheduled Meds: . aspirin EC  81 mg Oral Daily  . atorvastatin  20 mg Oral QPM  . clopidogrel  75 mg Oral Daily  . enoxaparin (LOVENOX) injection  40 mg Subcutaneous Q24H  . insulin aspart  0-5 Units Subcutaneous QHS  . insulin aspart  0-9 Units Subcutaneous TID WC  . magnesium chloride  1 tablet Oral BID  . metFORMIN  1,000 mg Oral BID WC  . multivitamin  5 mL Oral Daily  . [START ON 08/01/2017] pantoprazole  40 mg Oral Daily  . sodium chloride flush  3 mL Intravenous Q12H  . verapamil  120 mg Oral QHS   Continuous Infusions: . sodium chloride 75 mL/hr at 07/31/17 1526   PRN Meds:.            Technical Summary: This is a standard 16 channel EEG recording performed according to the international 10-20 electrode system.  AP bipolar, transverse bipolar, and referential montages were obtained, and digitally reformatted as necessary.  Duration of tracing: 20:59  Description: In the awake state there is a 6-7 Hz theta rhythm seen from the posterior head regions in a symmetric fashion. Background is disorganized and frequently disrupted by movement artifact.  Intermittent 2-3 Hz frontal slowing noted (FIRDA). No well defined drowsiness or stage II sleep is noted.  Neither hyperventilation or photic stimulation were performed.  EKG was monitored and noted  to be sinus rhythym with an average heart rate of 78 bpm.  No epileptiform changes were noted.  Impression: This is an abnormal EEG due to background slowing and disorganization seen throughout the tracing.  This is a non-specific finding that can be seen with toxic, metabolic, diffuse, or multifocal structural processes.  No definite epileptiform changes were noted.   A single EEG without epileptiform changes does not exclude the diagnosis of epilepsy. Clinical correlation advised.   Carvel Getting, M.D. Neurology Cell [phone]

## 2017-08-01 DIAGNOSIS — K219 Gastro-esophageal reflux disease without esophagitis: Secondary | ICD-10-CM | POA: Diagnosis present

## 2017-08-01 DIAGNOSIS — Z7982 Long term (current) use of aspirin: Secondary | ICD-10-CM | POA: Diagnosis not present

## 2017-08-01 DIAGNOSIS — E785 Hyperlipidemia, unspecified: Secondary | ICD-10-CM | POA: Diagnosis present

## 2017-08-01 DIAGNOSIS — F419 Anxiety disorder, unspecified: Secondary | ICD-10-CM | POA: Diagnosis present

## 2017-08-01 DIAGNOSIS — R55 Syncope and collapse: Secondary | ICD-10-CM | POA: Diagnosis not present

## 2017-08-01 DIAGNOSIS — Z8571 Personal history of Hodgkin lymphoma: Secondary | ICD-10-CM | POA: Diagnosis not present

## 2017-08-01 DIAGNOSIS — Z7984 Long term (current) use of oral hypoglycemic drugs: Secondary | ICD-10-CM | POA: Diagnosis not present

## 2017-08-01 DIAGNOSIS — R627 Adult failure to thrive: Secondary | ICD-10-CM | POA: Diagnosis not present

## 2017-08-01 DIAGNOSIS — F840 Autistic disorder: Secondary | ICD-10-CM | POA: Diagnosis present

## 2017-08-01 DIAGNOSIS — N183 Chronic kidney disease, stage 3 (moderate): Secondary | ICD-10-CM | POA: Diagnosis not present

## 2017-08-01 DIAGNOSIS — M8580 Other specified disorders of bone density and structure, unspecified site: Secondary | ICD-10-CM | POA: Diagnosis present

## 2017-08-01 DIAGNOSIS — G9340 Encephalopathy, unspecified: Secondary | ICD-10-CM | POA: Diagnosis present

## 2017-08-01 DIAGNOSIS — I5032 Chronic diastolic (congestive) heart failure: Secondary | ICD-10-CM | POA: Diagnosis not present

## 2017-08-01 DIAGNOSIS — E1140 Type 2 diabetes mellitus with diabetic neuropathy, unspecified: Secondary | ICD-10-CM | POA: Diagnosis present

## 2017-08-01 DIAGNOSIS — M2550 Pain in unspecified joint: Secondary | ICD-10-CM | POA: Diagnosis not present

## 2017-08-01 DIAGNOSIS — K209 Esophagitis, unspecified: Secondary | ICD-10-CM | POA: Diagnosis not present

## 2017-08-01 DIAGNOSIS — R4189 Other symptoms and signs involving cognitive functions and awareness: Secondary | ICD-10-CM | POA: Diagnosis present

## 2017-08-01 DIAGNOSIS — Z79899 Other long term (current) drug therapy: Secondary | ICD-10-CM | POA: Diagnosis not present

## 2017-08-01 DIAGNOSIS — I130 Hypertensive heart and chronic kidney disease with heart failure and stage 1 through stage 4 chronic kidney disease, or unspecified chronic kidney disease: Secondary | ICD-10-CM | POA: Diagnosis present

## 2017-08-01 DIAGNOSIS — Z7902 Long term (current) use of antithrombotics/antiplatelets: Secondary | ICD-10-CM | POA: Diagnosis not present

## 2017-08-01 DIAGNOSIS — F329 Major depressive disorder, single episode, unspecified: Secondary | ICD-10-CM | POA: Diagnosis present

## 2017-08-01 DIAGNOSIS — E876 Hypokalemia: Secondary | ICD-10-CM | POA: Diagnosis not present

## 2017-08-01 DIAGNOSIS — Z7401 Bed confinement status: Secondary | ICD-10-CM | POA: Diagnosis not present

## 2017-08-01 DIAGNOSIS — I272 Pulmonary hypertension, unspecified: Secondary | ICD-10-CM | POA: Diagnosis present

## 2017-08-01 DIAGNOSIS — E559 Vitamin D deficiency, unspecified: Secondary | ICD-10-CM | POA: Diagnosis present

## 2017-08-01 DIAGNOSIS — Z66 Do not resuscitate: Secondary | ICD-10-CM | POA: Diagnosis present

## 2017-08-01 DIAGNOSIS — E1122 Type 2 diabetes mellitus with diabetic chronic kidney disease: Secondary | ICD-10-CM | POA: Diagnosis present

## 2017-08-01 LAB — BASIC METABOLIC PANEL
ANION GAP: 9 (ref 5–15)
BUN: 23 mg/dL — ABNORMAL HIGH (ref 6–20)
CO2: 22 mmol/L (ref 22–32)
Calcium: 8.6 mg/dL — ABNORMAL LOW (ref 8.9–10.3)
Chloride: 106 mmol/L (ref 101–111)
Creatinine, Ser: 0.81 mg/dL (ref 0.44–1.00)
Glucose, Bld: 133 mg/dL — ABNORMAL HIGH (ref 65–99)
Potassium: 3.2 mmol/L — ABNORMAL LOW (ref 3.5–5.1)
Sodium: 137 mmol/L (ref 135–145)

## 2017-08-01 LAB — GLUCOSE, CAPILLARY
GLUCOSE-CAPILLARY: 110 mg/dL — AB (ref 65–99)
Glucose-Capillary: 129 mg/dL — ABNORMAL HIGH (ref 65–99)
Glucose-Capillary: 141 mg/dL — ABNORMAL HIGH (ref 65–99)
Glucose-Capillary: 123 mg/dL — ABNORMAL HIGH (ref 65–99)
Glucose-Capillary: 88 mg/dL (ref 65–99)

## 2017-08-01 MED ORDER — POTASSIUM CHLORIDE CRYS ER 20 MEQ PO TBCR
40.0000 meq | EXTENDED_RELEASE_TABLET | Freq: Once | ORAL | Status: AC
  Administered 2017-08-01: 40 meq via ORAL
  Filled 2017-08-01: qty 2

## 2017-08-01 MED ORDER — SODIUM CHLORIDE 0.9 % IV SOLN
INTRAVENOUS | Status: AC
  Administered 2017-08-01: 18:00:00 via INTRAVENOUS

## 2017-08-01 NOTE — Clinical Social Work Note (Signed)
Clinical Social Work Assessment  Patient Details  Name: Lisa Villegas MRN: [id number] Date of Birth: 1930/05/09  Date of referral:  08/01/17               Reason for consult:  Discharge Planning, Facility Placement                Permission sought to share information with:  Facility Sport and exercise psychologist, Family Supports Permission granted to share information::  Yes, Verbal Permission Granted  Name::     Financial planner::  [facility]  Relationship::  POA  Contact Information:  [phone]  Housing/Transportation Living arrangements for the past 2 months:  [place] of Information:  Patient, Other (Comment Required)(POA) Patient Interpreter Needed:  None Criminal Activity/Legal Involvement Pertinent to Current Situation/Hospitalization:  No - Comment as needed Significant Relationships:  Siblings, Other(Comment)(POA) Lives with:  Facility Resident Do you feel safe going back to the place where you live?  No Need for family participation in patient care:  Yes (Comment)  Care giving concerns:  CSW received consult for possible SNF placement at time of discharge. CSW spoke with patient and her Ivory Broad, regarding [name]'s request for SNF placement at time of discharge. Patient resides at [facility]. [name] stated that she was hired by the patient's father years ago to help patient with her autism. Per [name], patient's brother is in charge of her finances. [name] reports feeling that the patient's current facility director is rude and does not motivate the patient to do anything. CSW to continue to follow and assist with discharge planning needs.   Social Worker assessment / plan:  CSW spoke with patient and HCPOA concerning possibility of rehab at [facility] before returning home.  Employment status:  Retired Forensic scientist:  Medicare PT Recommendations:  Not assessed at this time Information / Referral to community resources:      Patient/Family's Response to care:  CSW discussed with HCPOA that patient is currently under observation status and does not currently qualify for rehab through [employer] [facility]. She reported that patient does not have the finances for privately paying at a snf. She states patient has received home health at the ALF before but patient refused to participate so the therapy would always stop. She states that patient is capable of doing things for herself but has not been motivated lately to do them, so she lets others take care of her. She reports only wanting to stay in the bed all day. CSW explained that the patient does not have many options in terms of placement due to the financial concerns. HCPOA stated that the patient does not qualify for Medicaid. She states that she will look into other ALF facilities like [facility] to see if there is a nicer one the patient can afford. Her current ALF has other care levels but they cost extra money. CSW to also inquire about home health, home first program, and any other assistance [facility] can provide. HCPOA is very pleasant and understanding. 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   Patient/Family's Understanding of and Emotional Response to Diagnosis, Current Treatment, and Prognosis:  Patient/family is realistic regarding therapy needs and expressed being hopeful for SNF placement but they express understanding of the cost. Patient expressed understanding of CSW role and discharge process as well as medical condition. No questions/concerns about plan or treatment.    Emotional Assessment Appearance:  Appears stated age Attitude/Demeanor/Rapport:  Other(Quiet but answers questions) Affect (typically observed):  Blunt, Quiet, Flat Orientation:  Oriented to Self, Oriented to Situation, Oriented to Place, Oriented to  Time Alcohol / Substance use:  Not Applicable Psych involvement (Current and /or in  the community):  No (Comment)  Discharge Needs  Concerns to be addressed:  Care Coordination Readmission within the last 30 days:  Yes Current discharge risk:  Inadequate Financial Supports, Dependent with Mobility Barriers to Discharge:  Continued Medical Work up   [employer], [address] 08/01/2017, 11:53 AM

## 2017-08-01 NOTE — Progress Notes (Signed)
PROGRESS NOTE   Lisa Villegas  UXL:[id number]    DOB: May 09, 1930    DOA: 07/31/2017  PCP: [name], MD   I have briefly reviewed patients previous medical records in [facility].  Brief Narrative:  84 year old female patient, resides at ALF, PMH of autism, DM 2, HTN, HLD, stage III chronic kidney disease, chronic diastolic CHF, anxiety and depression, GERD, Hodgkin's disease in remission, iron deficiency anemia, recent hospitalization 07/25/2017-07/26/2017 when she presented with confusion, right leg weakness, speech difficulties and was assessed to have acute metabolic encephalopathy and TIA, case was discussed with neurology who recommended DAPT x3 weeks followed by aspirin alone, now presented to ED on 07/31/2017 after a witnessed syncopal episode while ambulating with staff at facility when she suddenly went limp and unresponsive for about 1 minute, eyes rolled back and apparently had some left-sided drooping of her mouth.  CT head and chest x-ray without acute findings.   Assessment & Plan:   Principal Problem:   Syncope Active Problems:   Chronic diastolic heart failure, NYHA class 2 (HCC)   Uncontrolled hypertension   Diabetes mellitus, controlled (HCC)   CKD (chronic kidney disease) stage 3, GFR 30-59 ml/min (HCC)   FTT (failure to thrive) in adult   History of Hodgkin's disease   Tremor of right hand/chronic &benign   Syncope: Witnessed syncopal event with ambulation at ALF.  No orthostatic changes in ED.  Telemetry shows sinus rhythm without arrhythmias.  EEG: Abnormal due to background slowing and disorganization seen throughout the tracing which is nonspecific and can be seen with toxic, metabolic, diffuse or multifocal structural processes.  No definite epileptiform changes noted.  CT head without acute abnormality.  TTE 5/1: LVEF 65-70% and grade 2 diastolic dysfunction.  Carotid Dopplers: Bilateral 1--39% ICA stenosis.?  Vasovagal.  She likely does not drive and  should not be driving for 6 months.  Not sure if any additional work-up is needed unless she has recurrent episodes.  Continue DAPT as per prior recommendations.  Recent TIA: Continue DAPT x3 weeks followed by aspirin alone and outpatient follow-up with neurology.  Acute encephalopathy/cognitive impairment: Apparently not herself since returning from previous hospitalization.  Current episode may have been postictal confusion.  Now oriented x2 man mental status may be close to baseline but will need family input regarding this.  No clinical UTI.  Essential hypertension: Mildly uncontrolled at times.  Continue verapamil.  Adult failure to thrive: Reported poor oral intake since recent discharge.  Speech therapy evaluated and recommend regular diet.  PT evaluation pending to see if she needs higher level of care i.e. SNF.  Continue gentle IV fluids for now.  Chronic diastolic CHF: TTE as above.  Compensated.  Not on diuretics PTA.  Type II DM: Metformin was continued and SSI was added.  Reasonable inpatient control.  Stage II chronic kidney disease: Creatinine normal.  Hypokalemia: Replace and follow.  Magnesium normal.  Chronic tremor of right hand  Autism/cognitive impairment: Mental status may be at baseline.  Anemia:?  Dilutional.  Follow CBC in a.m.   DVT prophylaxis: Lovenox Code Status: DNR Family Communication: None at bedside. Disposition: To be determined pending PT evaluation and clinical improvement.   Consultants:  None  Procedures:  None  Antimicrobials:  None   Subjective: Oriented to self and partly to place.  "[facility] am in [facility]".  Indicates that she is in the hospital because she fell.  Cannot give any more details.  Denies complaints.  No  pain reported.  As per RN, no acute issues noted.  ROS: As above.  Objective:  Vitals:   08/01/17 0023 08/01/17 0508 08/01/17 1154 08/01/17 1215  BP: (!) 145/70 (!) 187/67 (!) 147/86 (!) 158/56  Pulse: 69  65  66  Resp: 17 16  16   Temp: 97.8 F (36.6 C) 98.9 F (37.2 C) 98.8 F (37.1 C) 98.7 F (37.1 C)  TempSrc: Oral Oral Oral   SpO2: 100% 98% 99% 99%  Weight:        Examination:  General exam: Pleasant elderly female, moderately built and nourished lying comfortably propped up in bed.  Oral mucosa with borderline hydration. Respiratory system: Clear to auscultation. Respiratory effort normal. Cardiovascular system: S1 & S2 heard, RRR. No JVD, murmurs, rubs, gallops or clicks. No pedal edema.  Telemetry personally reviewed: Sinus rhythm. Gastrointestinal system: Abdomen is nondistended, soft and nontender. No organomegaly or masses felt. Normal bowel sounds heard. Central nervous system: Mental status as indicated above.  Follows simple instructions. No focal neurological deficits. Extremities: Symmetric 5 x 5 power. Skin: No rashes, lesions or ulcers Psychiatry: Judgement and insight impaired. Mood & affect flat.     Data Reviewed: I have personally reviewed following labs and imaging studies  CBC: Recent Labs  Lab 07/25/17 1809 07/25/17 1818 07/31/17 0853  WBC 11.4*  --  10.6*  NEUTROABS 9.7*  --  9.1*  HGB 12.5 13.6 11.3*  HCT 39.0 40.0 36.1  MCV 81.1  --  80.6  PLT 290  --  952   Basic Metabolic Panel: Recent Labs  Lab 07/25/17 1809 07/25/17 1818 07/31/17 0853 07/31/17 1707 08/01/17 0426  NA 133* 135 139  --  137  K 5.5* 5.4* 3.5  --  3.2*  CL 100* 102 103  --  106  CO2 20*  --  24  --  22  GLUCOSE 239* 242* 145*  --  133*  BUN 32* 31* 22*  --  23*  CREATININE 1.22* 1.10* 0.86  --  0.81  CALCIUM 9.9  --  9.2  --  8.6*  MG  --   --   --  1.7  --   PHOS  --   --   --  4.1  --    Liver Function Tests: Recent Labs  Lab 07/25/17 1809 07/31/17 0853  AST 26 16  ALT 22 14  ALKPHOS 71 75  BILITOT 0.6 0.3  PROT 7.7 6.8  ALBUMIN 3.9 3.2*   Coagulation Profile: Recent Labs  Lab 07/25/17 1809  INR 1.04   Cardiac Enzymes: Recent Labs  Lab  07/31/17 1707  CKTOTAL 48   HbA1C: No results for input(s): HGBA1C in the last 72 hours. CBG: Recent Labs  Lab 07/31/17 1817 07/31/17 2128 08/01/17 0805 08/01/17 1209 08/01/17 1338  GLUCAP 214* 107* 129* 141* 123*    Recent Results (from the past 240 hour(s))  MRSA PCR Screening     Status: None   Collection Time: 07/26/17  3:00 AM  Result Value Ref Range Status   MRSA by PCR NEGATIVE NEGATIVE Final    Comment:        The GeneXpert MRSA Assay (FDA approved for NASAL specimens only), is one component of a comprehensive MRSA colonization surveillance program. It is not intended to diagnose MRSA infection nor to guide or monitor treatment for MRSA infections. Performed at [facility], [place] [address] [place], [place] [address]          Radiology  Studies: Dg Chest 2 View  Result Date: 07/31/2017 CLINICAL DATA:  Loss of consciousness this morning, history CHF, diabetes mellitus, hypertension, stage III chronic kidney disease EXAM: CHEST - 2 VIEW COMPARISON:  Portable exam of 07/26/2017 FINDINGS: Rotated to the LEFT. Normal heart size, mediastinal contours, and pulmonary vascularity. Atherosclerotic calcifications aorta. Bibasilar atelectasis. Lungs otherwise clear. No pleural effusion or pneumothorax. Bones demineralized. IMPRESSION: Bibasilar atelectasis. Electronically Signed   By: Lavonia Dana M.D.   On: 07/31/2017 10:34   Ct Head Wo Contrast  Result Date: 07/31/2017 CLINICAL DATA:  Seizure, nontraumatic. EXAM: CT HEAD WITHOUT CONTRAST TECHNIQUE: Contiguous axial images were obtained from the base of the skull through the vertex without intravenous contrast. COMPARISON:  07/25/2017 head CT. FINDINGS: Brain: Stable tiny left basal ganglia lacune. No evidence of parenchymal hemorrhage or extra-axial fluid collection. No mass lesion, mass effect, or midline shift. No CT evidence of acute infarction. Generalized cerebral volume loss. Nonspecific mild subcortical and  periventricular white matter hypodensity, most in keeping with chronic small vessel ischemic change. No ventriculomegaly. Vascular: No acute abnormality. Skull: No evidence of calvarial fracture. Sinuses/Orbits: Minimal partial opacification of the left sphenoid sinus. No fluid levels. Other:  The mastoid air cells are unopacified. IMPRESSION: 1.  No evidence of acute intracranial abnormality. 2. Generalized cerebral volume loss, stable tiny left basal ganglia lacune and mild chronic small vessel ischemic changes in the cerebral white matter. Electronically Signed   By: Ilona Sorrel M.D.   On: 07/31/2017 10:28        Scheduled Meds: . aspirin EC  81 mg Oral Daily  . atorvastatin  20 mg Oral QPM  . clopidogrel  75 mg Oral Daily  . enoxaparin (LOVENOX) injection  40 mg Subcutaneous Q24H  . insulin aspart  0-5 Units Subcutaneous QHS  . insulin aspart  0-9 Units Subcutaneous TID WC  . magnesium chloride  1 tablet Oral BID  . metFORMIN  1,000 mg Oral BID WC  . multivitamin  5 mL Oral Daily  . pantoprazole  40 mg Oral Daily  . sodium chloride flush  3 mL Intravenous Q12H  . verapamil  120 mg Oral QHS   Continuous Infusions: . sodium chloride 75 mL/hr at 08/01/17 0553     LOS: 0 days     Vernell Leep, MD, FACP, [facility]. [facility] Pager [phone]  If 7PM-7AM, please contact night-coverage [email or web address] Password [facility] 08/01/2017, 4:56 PM

## 2017-08-01 NOTE — Progress Notes (Deleted)
Initial Nutrition Assessment  DOCUMENTATION CODES:   Not applicable  INTERVENTION:  Snacks ordered  NUTRITION DIAGNOSIS:   Increased nutrient needs related to chronic illness as evidenced by estimated needs.  GOAL:   Patient will meet greater than or equal to 90% of their needs   MONITOR:   PO intake, Labs, Weight trends  REASON FOR ASSESSMENT:   Consult Assessment of nutrition requirement/status  ASSESSMENT:   Lisa Villegas is a 84 y.o. female with a Past Medical History of HTN; HLD; remote Hodgkin's disease; DM; stage 3 CKD; and chronic CHF  who presents with AMS, acute encephalopathy  RD consulted to see patient for nutrition assessment Spoke with Lisa Villegas at bedside. She reports her appetite has decreased since she got sick "9-10 weeks ago."   Usual intake as follows: Breakfast: Coffee Lunch: 1/2 a sandwich Dinner: Steamed broccoli and a sweet potato  She reports a history of celiac disease and gluten intolerance Does not like to eat much meat, but states she gets her protein from dairy and cheese. Likes to snack on sweet potato fries, or gluten free crackers and cheese. Also eats yogurt, cottage cheese and fruit.  Patient reports that recently she eats smaller amounts of food because she gets nauseated with large amounts of PO Intake. She is unsure of her usual body weight, avoids the scale, but she states that she has dropped two pant sizes since becoming sick 9-10 weeks ago. Per chart she demonstrates a 3 pound weight loss from 5/1 - 5/6 but the 5 weights before that appear stated. Unclear weight history.  She received a tray with multiple foods on it that did have gluten in them, as a result she did not eat much. Was eating fruit during visit.  Unable to diagnose malnutrition at this time. Patient does state prior to "getting sick 9-10 weeks and being unable to walk," she was a runner, very physically active, which may explain her retention of muscle and  fat.  Labs reviewed:  K+ 3.2  Medications reviewed and include:  Insulin, MVI w/ Minerals, Glucophage, MgCL, NS at 27mL/hr  NUTRITION - FOCUSED PHYSICAL EXAM:    Most Recent Value  Orbital Region  No depletion  Upper Arm Region  No depletion  Thoracic and Lumbar Region  No depletion  Buccal Region  No depletion  Temple Region  Mild depletion  Clavicle Bone Region  Mild depletion  Clavicle and Acromion Bone Region  Mild depletion  Scapular Bone Region  Mild depletion  Dorsal Hand  Mild depletion  Patellar Region  Unable to assess  Anterior Thigh Region  Unable to assess  Posterior Calf Region  Unable to assess  Edema (RD Assessment)  Mild       Diet Order:   Diet Order           Diet gluten free Room service appropriate? Yes; Fluid consistency: Thin  Diet effective now          EDUCATION NEEDS:   Not appropriate for education at this time  Skin:  Skin Assessment: Reviewed RN Assessment(ecchymosis to L Lower back)  Last BM:  PTA  Height:   Ht Readings from Last 1 Encounters:  07/26/17 5\' 2"  (1.575 m)    Weight:   Wt Readings from Last 1 Encounters:  07/31/17 135 lb (61.2 kg)    Ideal Body Weight:  50 kg  BMI:  Body mass index is 24.69 kg/m.  Estimated Nutritional Needs:   Kcal:  1250-1400 calories  Protein:  74-86 grams (1.2-1.4g/kg)  Fluid:  >1.5L  Satira Anis. Rayden Scheper, MS, RD LDN Inpatient Clinical Dietitian Pager [phone]

## 2017-08-01 NOTE — Progress Notes (Signed)
PT Cancellation Note  Patient Details Name: Lisa Villegas MRN: [id number] DOB: 1930-09-26   Cancelled Treatment:    Reason Eval/Treat Not Completed: Other (comment).  Pt is lethargic but opens her eyes to say no in regards to sitting up or getting OOB.  Will try at another time as pt will allow.   Ramond Dial 08/01/2017, 9:09 AM   Mee Hives, PT MS Acute Rehab Dept. Number: [place] [facility] [place]

## 2017-08-01 NOTE — Evaluation (Signed)
Clinical/Bedside Swallow Evaluation Patient Details  Name: Lisa Villegas MRN: [id number] Date of Birth: Mar 01, 1931  Today's Date: 08/01/2017 Time: SLP Start Time (ACUTE ONLY): 1116 SLP Stop Time (ACUTE ONLY): 1124 SLP Time Calculation (min) (ACUTE ONLY): 8 min  Past Medical History:  Past Medical History:  Diagnosis Date  . Anemia   . Anxiety and depression    per med rec  . 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 Autism   . Chronic CHF (congestive heart failure) (HCC)    per med rec  . Chronic kidney disease (CKD)    stage 3 per chart in 09/2015  . Diabetes mellitus without complication ([place])   . Diabetic neuropathy ([place])   . History of thyroid nodule    nontoxic goiter per med rec  . Hodgkin disease ([place])    in 31  . HTN (hypertension), benign   . Hyperlipidemia   . Incontinence   . Lack of sensation   . Osteopenia    per med rec  . Poor balance   . Tubular adenoma of colon 01/2017  . Vitamin D deficiency    Past Surgical History:  Past Surgical History:  Procedure Laterality Date  . ABDOMINAL HYSTERECTOMY    . BREAST LUMPECTOMY Left    HPI:  Lisa Villegas is a 84 y.o. female with a Past Medical History of HTN; HLD; remote Hodgkin's disease; DM; stage 3 CKD; and chronic CHF  who presents with AMS.  She was previously hospitalized from 4/30-5/1 for similar symptoms  At that time, it was attributed to TIA. MD note reports pts mentation waxes and wanes, no pthologic source.    Assessment / Plan / Recommendation Clinical Impression  Pt demonstrates normal swallow function. No signs of dysphagia observed. Pt demonstrates self feeding. No diet modification or SLP f/u needed, will sign off.  SLP Visit Diagnosis: Dysphagia, unspecified (R13.10)    Aspiration Risk  Mild aspiration risk    Diet Recommendation Regular;Thin liquid   Liquid Administration via: Cup;Straw Medication Administration: Whole meds with liquid Supervision: Patient able to self feed Compensations:  Slow rate;Small sips/bites Postural Changes: Seated upright at 90 degrees    Other  Recommendations     Follow up Recommendations        Frequency and Duration            Prognosis        Swallow Study   General HPI: Lisa Villegas is a 84 y.o. female with a Past Medical History of HTN; HLD; remote Hodgkin's disease; DM; stage 3 CKD; and chronic CHF  who presents with AMS.  She was previously hospitalized from 4/30-5/1 for similar symptoms  At that time, it was attributed to TIA. MD note reports pts mentation waxes and wanes, no pthologic source.  Type of Study: Bedside Swallow Evaluation Previous Swallow Assessment: none Diet Prior to this Study: Regular;Thin liquids Temperature Spikes Noted: No Respiratory Status: Room air History of Recent Intubation: No Behavior/Cognition: Alert;Cooperative;Pleasant mood Oral Cavity Assessment: Within Functional Limits Oral Care Completed by SLP: No Oral Cavity - Dentition: Adequate natural dentition;Dentures, top;Dentures, bottom(partials) Vision: Functional for self-feeding Self-Feeding Abilities: Able to feed self Patient Positioning: Upright in bed Baseline Vocal Quality: Normal Volitional Cough: Strong Volitional Swallow: Able to elicit    Oral/Motor/Sensory Function Overall Oral Motor/Sensory Function: Within functional limits   Ice Chips     Thin Liquid Thin Liquid: Within functional limits Presentation: Cup;Straw;Self Fed    Nectar Thick Nectar Thick Liquid: Not tested  Honey Thick Honey Thick Liquid: Not tested   Puree Puree: Within functional limits Presentation: Self Fed;Spoon   Solid   GO   Solid: Within functional limits       [facility], MA CCC-SLP [phone]  Lynann Beaver 08/01/2017,11:24 AM

## 2017-08-02 LAB — GLUCOSE, CAPILLARY
GLUCOSE-CAPILLARY: 114 mg/dL — AB (ref 65–99)
GLUCOSE-CAPILLARY: 108 mg/dL — AB (ref 65–99)
GLUCOSE-CAPILLARY: 160 mg/dL — AB (ref 65–99)
Glucose-Capillary: 122 mg/dL — ABNORMAL HIGH (ref 65–99)

## 2017-08-02 LAB — BASIC METABOLIC PANEL
ANION GAP: 9 (ref 5–15)
BUN: 14 mg/dL (ref 6–20)
CALCIUM: 8.7 mg/dL — AB (ref 8.9–10.3)
CO2: 22 mmol/L (ref 22–32)
CREATININE: 0.78 mg/dL (ref 0.44–1.00)
Chloride: 106 mmol/L (ref 101–111)
GFR calc non Af Amer: >60 mL/min (ref >60)
Glucose, Bld: 116 mg/dL — ABNORMAL HIGH (ref 65–99)
Potassium: 3.7 mmol/L (ref 3.5–5.1)
Sodium: 137 mmol/L (ref 135–145)

## 2017-08-02 LAB — CBC
HEMATOCRIT: 34.7 % — AB (ref 36.0–46.0)
HEMOGLOBIN: 10.6 g/dL — AB (ref 12.0–15.0)
MCH: 24.9 pg — ABNORMAL LOW (ref 26.0–34.0)
MCHC: 30.5 g/dL (ref 30.0–36.0)
MCV: 81.5 fL (ref 78.0–100.0)
Platelets: 326 10*3/uL (ref 150–400)
RBC: 4.26 MIL/uL (ref 3.87–5.11)
RDW: 16.3 % — AB (ref 11.5–15.5)
WBC: 7.7 10*3/uL (ref 4.0–10.5)

## 2017-08-02 MED ORDER — HYDRALAZINE HCL 10 MG PO TABS
10.0000 mg | ORAL_TABLET | Freq: Three times a day (TID) | ORAL | Status: DC
  Administered 2017-08-02 – 2017-08-03 (×4): 10 mg via ORAL
  Filled 2017-08-02 (×4): qty 1

## 2017-08-02 MED ORDER — HYDRALAZINE HCL 20 MG/ML IJ SOLN
10.0000 mg | Freq: Four times a day (QID) | INTRAMUSCULAR | Status: DC | PRN
  Administered 2017-08-02: 10 mg via INTRAVENOUS
  Filled 2017-08-02: qty 1

## 2017-08-02 MED ORDER — HYDRALAZINE HCL 20 MG/ML IJ SOLN: 10.0000 mg | Freq: Four times a day (QID) | INTRAMUSCULAR | Status: DC | PRN

## 2017-08-02 NOTE — Progress Notes (Signed)
Per [facility], they need to come assess the patient for return.   Percell Locus Zohaib Heeney LCSW [phone]

## 2017-08-02 NOTE — Evaluation (Signed)
Occupational Therapy Evaluation Patient Details Name: Lisa Villegas MRN: [id number] DOB: Aug 13, 1930 Today's Date: 08/02/2017    History of Present Illness 84 yo female with onset of syncopal episode that was preceded by recent metabolic encephalopathy and TIA, now with AMS, had L facial changes at the time she passed out.  PMHx:  anemia, anxiety, autism, CHF, CKD 3, DM, Hodgkin's disease, HTN, colon adenoma, osteopenia   Clinical Impression   Pt admitted with above and presents OT with generalized weakness impacting performance of ADLs at PLOF.  Pt with limited participation during eval, repeatedly stating "no" with encouragement for OOB or even EOB activity.  Of note, pt with autism diagnosis and may have demonstrated increased participation with family/familiar face for encouragement.  Pt acknowledges she may be weaker than PTA but still adamant about not getting OOB at this time.  Pt would benefit from OT acutely to decrease burden of care and increase participation in ADLs to prepare for d/c to venue listed below.    Follow Up Recommendations  SNF    Equipment Recommendations  None recommended by OT    Recommendations for Other Services       Precautions / Restrictions Precautions Precautions: Fall      Mobility Bed Mobility Overal bed mobility: Needs Assistance Bed Mobility: Rolling Rolling: Min assist;Mod assist         General bed mobility comments: part of her assistance needs are reluctance to participate in any mobility with therapist.  Transfers                 General transfer comment: Pt refused any transfers, despite encouragement to sit in recliner to eat lunch        ADL either performed or assessed with clinical judgement   ADL Overall ADL's : Needs assistance/impaired                                       General ADL Comments: Unsure as pt declined participation in any activity.  Pt reports they would assist her with  bathing at ALF and that she could dress without assist.  She does acknowledge that she may not be at same level at she was PTA.  Continues to refuse any participation despite frequent encouragement.                  Pertinent Vitals/Pain Pain Assessment: No/denies pain     Hand Dominance Right   Extremity/Trunk Assessment Upper Extremity Assessment Upper Extremity Assessment: Generalized weakness           Communication Communication Communication: No difficulties   Cognition Arousal/Alertness: Awake/alert;Lethargic Behavior During Therapy: Flat affect;Anxious Overall Cognitive Status: History of cognitive impairments - at baseline                                 General Comments: pt is autistic per chart              Home Living Family/patient expects to be discharged to:: Assisted living     Type of Home: Assisted living                       Home Equipment: (reports walking with no AD, has "seat" in shower)   Additional Comments: pt may not be a good historian.  She was very  brief with answers and required frequent prompting to participate      Prior Functioning/Environment Level of Independence: Needs assistance  Gait / Transfers Assistance Needed: Reports that she would walk without AD ADL's / Homemaking Assistance Needed: Pt reports they would assist her with bathing and that she could dress independently.            OT Problem List: Decreased activity tolerance;Impaired balance (sitting and/or standing)      OT Treatment/Interventions: Self-care/ADL training;Therapeutic activities;Patient/family education;Balance training    OT Goals(Current goals can be found in the care plan section) Acute Rehab OT Goals Patient Stated Goal: none stated OT Goal Formulation: With patient Time For Goal Achievement: 08/16/17 Potential to Achieve Goals: Good  OT Frequency: Min 2X/week    AM-PAC PT "6 Clicks" Daily Activity     Outcome  Measure Help from another person eating meals?: None Help from another person taking care of personal grooming?: A Little Help from another person toileting, which includes using toliet, bedpan, or urinal?: A Lot Help from another person bathing (including washing, rinsing, drying)?: A Lot Help from another person to put on and taking off regular upper body clothing?: A Little Help from another person to put on and taking off regular lower body clothing?: A Little 6 Click Score: 17   End of Session    Activity Tolerance: Treatment limited secondary to agitation Patient left: in bed;with bed alarm set  OT Visit Diagnosis: Unsteadiness on feet (R26.81);Adult, failure to thrive (R62.7);Muscle weakness (generalized) (M62.81)                Time: 2023-3435 OT Time Calculation (min): 10 min Charges:  OT General Charges $OT Visit: 1 Visit OT Evaluation $OT Eval Moderate Complexity: [place], [place], [place] 08/02/2017, 2:03 PM

## 2017-08-02 NOTE — Progress Notes (Signed)
Social worker requested AD for patient family to do while she is here.  Was short of time so I took the form by and told social worker we do these before 3 Monday thru Friday.  Left the form-patient was busy with someone else. Conard Novak, Chaplain   08/02/17 1600  Clinical Encounter Type  Visited With Other (Comment) (social worker requested AD forms to be done tomorrow.  )  Visit Type Other (Comment) (took AD forms)  Referral From Social work  Consult/Referral To [employer]

## 2017-08-02 NOTE — Progress Notes (Signed)
Dr Tana [place] notified Pts blood pressure 180/74 manually verbal order given for 10 Mg hydralazine every 6 hrs prn for systolic blood pressure greater than 160 will continue to monitor

## 2017-08-02 NOTE — Consult Note (Signed)
   [facility] CM Inpatient Consult   08/02/2017  Lisa Villegas 03-08-1931 [id number]  Patient assessed for readmission in  [employer] Medicare plan.  Patient is from [facility] facility.  Admitted with Metabolic Encephalopathy. Went by the patient's room and there was no family at bedside.  Patient was sound asleep.  For questions contact:   Natividad Brood, RN BSN [facility] Liaison  [phone] business mobile phone Toll free office [phone]

## 2017-08-02 NOTE — Evaluation (Signed)
Physical Therapy Evaluation Patient Details Name: Lisa Villegas MRN: [id number] DOB: 1930/04/10 Today's Date: 08/02/2017   History of Present Illness  84 yo female with onset of syncopal episode that was preceded by recent metabolic encephalopathy and TIA, now with AMS, had L facial changes at the time she passed out.  PMHx:  anemia, anxiety, autism, CHF, CKD 3, DM, Hodgkin's disease, HTN, colon adenoma, osteopenia  Clinical Impression  Pt was seen for evaluation of mobility with instructions for standing and getting to chair.  Pt declined several times and finally began to lean over with great effort to return to bed.  Positioned her at top of bed with cath reconnected.   Will continue PT coverage until her DC to increase strength and balance.  Pt is not able to answer questions about PLOF and so will work toward highest level PT can until transition to SNF.    Follow Up Recommendations SNF    Equipment Recommendations  Rolling walker with 5" wheels(if she does not have one in prior environment)    Recommendations for Other Services       Precautions / Restrictions Precautions Precautions: Fall(telemetry) Restrictions Weight Bearing Restrictions: No      Mobility  Bed Mobility Overal bed mobility: Needs Assistance Bed Mobility: Supine to Sit;Sit to Supine     Supine to sit: Mod assist Sit to supine: Mod assist   General bed mobility comments: part of her assistance needs are reluctance to get OOB in the first place, then wanting to get back instead of into chair  Transfers Overall transfer level: Needs assistance Equipment used: Rolling walker (2 wheeled);1 person hand held assist Transfers: Sit to/from Stand Sit to Stand: Total assist;From elevated surface         General transfer comment: pt would not extend an effort and asked three times to return to bed.  Ambulation/Gait             General Gait Details: pt would not attempt  Stairs             Wheelchair Mobility    Modified Rankin (Stroke Patients Only)       Balance Overall balance assessment: Needs assistance Sitting-balance support: Feet supported;Bilateral upper extremity supported Sitting balance-Leahy Scale: Fair                                       Pertinent Vitals/Pain Pain Assessment: Faces Faces Pain Scale: No hurt    Home Living Family/patient expects to be discharged to:: Assisted living     Type of Home: Assisted living         Home Equipment: (pt unable to tell PT much, but reported walking with no AD) Additional Comments: pt may not be a good historian.    Prior Function Level of Independence: (pt is not able to say much but reports walking in ALF no AD)               Hand Dominance        Extremity/Trunk Assessment   Upper Extremity Assessment Upper Extremity Assessment: Generalized weakness    Lower Extremity Assessment Lower Extremity Assessment: Generalized weakness    Cervical / Trunk Assessment Cervical / Trunk Assessment: Kyphotic  Communication   Communication: No difficulties  Cognition Arousal/Alertness: Awake/alert;Lethargic Behavior During Therapy: Flat affect;Anxious Overall Cognitive Status: History of cognitive impairments - at baseline  General Comments: pt is autistic per chart      General Comments General comments (skin integrity, edema, etc.): pt has a purwick catheter, has fragile skin and IV in R arm.      Exercises     Assessment/Plan    PT Assessment Patient needs continued PT services  PT Problem List Decreased strength;Decreased range of motion;Decreased activity tolerance;Decreased balance;Decreased mobility;Decreased coordination;Decreased cognition;Decreased knowledge of use of DME;Decreased safety awareness;Cardiopulmonary status limiting activity;Decreased skin integrity       PT Treatment Interventions DME  instruction;Gait training;Functional mobility training;Therapeutic activities;Therapeutic exercise;Balance training;Neuromuscular re-education;Patient/family education    PT Goals (Current goals can be found in the Care Plan section)  Acute Rehab PT Goals Patient Stated Goal: none stated PT Goal Formulation: Patient unable to participate in goal setting Time For Goal Achievement: 08/16/17 Potential to Achieve Goals: Fair    Frequency Min 2X/week   Barriers to discharge Inaccessible home environment;Decreased caregiver support home in ALF where her PLOF may be ambulatory and has limited staff assistance    Co-evaluation               AM-PAC PT "6 Clicks" Daily Activity  Outcome Measure Difficulty turning over in bed (including adjusting bedclothes, sheets and blankets)?: Unable Difficulty moving from lying on back to sitting on the side of the bed? : Unable Difficulty sitting down on and standing up from a chair with arms (e.g., wheelchair, bedside commode, etc,.)?: Unable Help needed moving to and from a bed to chair (including a wheelchair)?: A Lot Help needed walking in hospital room?: Total Help needed climbing 3-5 steps with a railing? : Total 6 Click Score: 7    End of Session Equipment Utilized During Treatment: Gait belt Activity Tolerance: Patient limited by fatigue;Patient limited by lethargy Patient left: in bed;with call bell/phone within reach;with bed alarm set;Other (comment)(purwick connected) Nurse Communication: Mobility status PT Visit Diagnosis: Muscle weakness (generalized) (M62.81);Difficulty in walking, not elsewhere classified (R26.2);Adult, failure to thrive (R62.7)    Time: 0925-0951 PT Time Calculation (min) (ACUTE ONLY): 26 min   Charges:   PT Evaluation $PT Eval Moderate Complexity: 1 Mod PT Treatments $Therapeutic Activity: 8-22 mins   PT G Codes:   PT G-Codes **NOT FOR INPATIENT CLASS** Functional Assessment Tool Used: AM-PAC 6 Clicks  Basic Mobility    Ramond Dial 08/02/2017, 11:10 AM   Mee Hives, PT MS Acute Rehab Dept. Number: [place]

## 2017-08-02 NOTE — Progress Notes (Signed)
[facility]                                                                              Patient Demographics  Lisa Villegas, is a 84 y.o. female, DOB - 1931-03-04, JIR:[id number]  Admit date - 07/31/2017   Admitting Physician Karmen Bongo, MD  Outpatient Primary MD for the patient is Lisa Seashore, MD  Outpatient specialists:   LOS - 1  days   Medical records reviewed and are as summarized below:    Chief Complaint  Patient presents with  . Loss of Consciousness       Brief summary   84 year old female patient, resides at ALF, PMH of autism, DM 2, HTN, HLD, stage III chronic kidney disease, chronic diastolic CHF, anxiety and depression, GERD, Hodgkin's disease in remission, iron deficiency anemia, recent hospitalization 07/25/2017-07/26/2017 when she presented with confusion, right leg weakness, speech difficulties and was assessed to have acute metabolic encephalopathy and TIA, case was discussed with neurology who recommended DAPT x3 weeks followed by aspirin alone, now presented to ED on 07/31/2017 after a witnessed syncopal episode while ambulating with staff at facility when she suddenly went limp and unresponsive for about 1 minute, eyes rolled back and apparently had some left-sided drooping of her mouth.  CT head and chest x-ray without acute findings.  Assessment & Plan    Principal Problem:   Syncope: Likely vasovagal -Patient had a witnessed syncopal event with ambulation at ALF, no orthostasis. -Telemetry with sinus rhythm, no arrhythmias -EEG showed abnormal due to background slowing and disorganization, no definite epileptiform changes -CT head showed no acute abnormality -2D echo 5/1 showed EF of 65 to 70% with grade 2 diastolic dysfunction -Carotid Dopplers bilateral 1 to 39% ICA stenosis.  -PT evaluation recommended SNF  Active Problems: Recent TIA Work-up complete, continue dual antiplatelet agents for 3 weeks followed by aspirin  alone, outpatient follow-up with neurology  Essential hypertension -Uncontrolled, continue verapamil, added hydralazine 10 mg 3 times daily  Adult failure to thrive -PT evaluation recommended SNF    Chronic diastolic heart failure, NYHA class 2 (HCC) -2D echo showed EF of 65 to 70% and grade 2 diastolic dysfunction, currently compensated    Diabetes mellitus, controlled ([place]) -Continue sliding scale insulin, CBGs controlled    CKD (chronic kidney disease) stage 3, GFR 30-59 ml/min (HCC) -Creatinine stable   Autism/cognitive impairment, chronic tremor of right hand -Currently appears to be at baseline   Code Status: DNR DVT Prophylaxis:  Lovenox  Family Communication: Discussed in detail with the patient, all imaging results, lab results explained to the patient    Disposition Plan:   Time Spent in minutes 25 minutes  Procedures:  None  Consultants:   None  Antimicrobials:      Medications  Scheduled Meds: . aspirin EC  81 mg Oral Daily  . atorvastatin  20 mg Oral QPM  . clopidogrel  75 mg Oral Daily  . enoxaparin (LOVENOX) injection  40 mg Subcutaneous Q24H  . hydrALAZINE  10 mg Oral Q8H  . insulin aspart  0-5 Units Subcutaneous QHS  . insulin aspart  0-9 Units Subcutaneous TID  WC  . magnesium chloride  1 tablet Oral BID  . metFORMIN  1,000 mg Oral BID WC  . multivitamin  5 mL Oral Daily  . pantoprazole  40 mg Oral Daily  . sodium chloride flush  3 mL Intravenous Q12H  . verapamil  120 mg Oral QHS   Continuous Infusions: PRN Meds:.hydrALAZINE   Antibiotics   Anti-infectives (From admission, onward)   None        Subjective:   Lisa Villegas was seen and examined today.  Patient denies dizziness, chest pain, shortness of breath, abdominal pain, N/V/D/C.  No acute issues overnight.  BP uncontrolled.  Objective:   Vitals:   08/02/17 0344 08/02/17 0644 08/02/17 0808 08/02/17 0826  BP: 103/89  (!) 189/63 (!) 180/74  Pulse: 64  64   Resp:  16  16   Temp: 98.5 F (36.9 C)  98.1 F (36.7 C)   TempSrc:   Oral   SpO2: 98%  99%   Weight:  62.1 kg (136 lb 14.5 oz)      Intake/Output Summary (Last 24 hours) at 08/02/2017 1254 Last data filed at 08/02/2017 1114 Gross per 24 hour  Intake 699.17 ml  Output 1900 ml  Net -1200.83 ml     Wt Readings from Last 3 Encounters:  08/02/17 62.1 kg (136 lb 14.5 oz)  07/26/17 63 kg (138 lb 15.3 oz)  05/12/17 63.5 kg (140 lb)     Exam  General: Alert and oriented x self, NAD  Eyes:  HEENT:  Atraumatic, normocephalic  Cardiovascular: S1 S2 auscultated, no rubs, murmurs or gallops. Regular rate and rhythm.  Respiratory: Clear to auscultation bilaterally, no wheezing, rales or rhonchi  Gastrointestinal: Soft, nontender, nondistended, + bowel sounds  Ext: no pedal edema bilaterally  Neuro: no new deficits  Musculoskeletal: No digital cyanosis, clubbing  Skin: No rashes  Psych: flat affect   Data Reviewed:  I have personally reviewed following labs and imaging studies  Micro Results Recent Results (from the past 240 hour(s))  MRSA PCR Screening     Status: None   Collection Time: 07/26/17  3:00 AM  Result Value Ref Range Status   MRSA by PCR NEGATIVE NEGATIVE Final    Comment:        The GeneXpert MRSA Assay (FDA approved for NASAL specimens only), is one component of a comprehensive MRSA colonization surveillance program. It is not intended to diagnose MRSA infection nor to guide or monitor treatment for MRSA infections. Performed at [facility], [place] [address] [place], [place] [address]     Radiology Reports Dg Chest 2 View  Result Date: 07/31/2017 CLINICAL DATA:  Loss of consciousness this morning, history CHF, diabetes mellitus, hypertension, stage III chronic kidney disease EXAM: CHEST - 2 VIEW COMPARISON:  Portable exam of 07/26/2017 FINDINGS: Rotated to the LEFT. Normal heart size, mediastinal contours, and pulmonary vascularity. Atherosclerotic  calcifications aorta. Bibasilar atelectasis. Lungs otherwise clear. No pleural effusion or pneumothorax. Bones demineralized. IMPRESSION: Bibasilar atelectasis. Electronically Signed   By: Lavonia Dana M.D.   On: 07/31/2017 10:34   Ct Head Wo Contrast  Result Date: 07/31/2017 CLINICAL DATA:  Seizure, nontraumatic. EXAM: CT HEAD WITHOUT CONTRAST TECHNIQUE: Contiguous axial images were obtained from the base of the skull through the vertex without intravenous contrast. COMPARISON:  07/25/2017 head CT. FINDINGS: Brain: Stable tiny left basal ganglia lacune. No evidence of parenchymal hemorrhage or extra-axial fluid collection. No mass lesion, mass effect, or midline shift. No CT evidence  of acute infarction. Generalized cerebral volume loss. Nonspecific mild subcortical and periventricular white matter hypodensity, most in keeping with chronic small vessel ischemic change. No ventriculomegaly. Vascular: No acute abnormality. Skull: No evidence of calvarial fracture. Sinuses/Orbits: Minimal partial opacification of the left sphenoid sinus. No fluid levels. Other:  The mastoid air cells are unopacified. IMPRESSION: 1.  No evidence of acute intracranial abnormality. 2. Generalized cerebral volume loss, stable tiny left basal ganglia lacune and mild chronic small vessel ischemic changes in the cerebral white matter. Electronically Signed   By: Ilona Sorrel M.D.   On: 07/31/2017 10:28   Ct Head Wo Contrast  Result Date: 07/25/2017 CLINICAL DATA:  Unable wall. EXAM: CT HEAD WITHOUT CONTRAST TECHNIQUE: Contiguous axial images were obtained from the base of the skull through the vertex without intravenous contrast. COMPARISON:  CT scan of February 05, 2016. FINDINGS: Brain: Mild diffuse cortical atrophy is noted. Mild chronic ischemic white matter disease is noted. No mass effect or midline shift is noted. Ventricular size is within normal limits. There is no evidence of mass lesion, hemorrhage or acute infarction.  Vascular: No hyperdense vessel or unexpected calcification. Skull: Normal. Negative for fracture or focal lesion. Sinuses/Orbits: Left sphenoid sinusitis. Other: None. IMPRESSION: Mild diffuse cortical atrophy. Mild chronic ischemic white matter disease. No acute intracranial abnormality seen. Electronically Signed   By: Marijo Conception, M.D.   On: 07/25/2017 19:35   Mr Brain Wo Contrast  Result Date: 07/26/2017 CLINICAL DATA:  Altered mental status EXAM: MRI HEAD WITHOUT CONTRAST TECHNIQUE: Multiplanar, multiecho pulse sequences of the brain and surrounding structures were obtained without intravenous contrast. COMPARISON:  Head CT 07/25/2017 FINDINGS: BRAIN: The midline structures are normal. There is no acute infarct or acute hemorrhage. No mass lesion, hydrocephalus, dural abnormality or extra-axial collection. Multifocal white matter hyperintensity, most commonly due to chronic ischemic microangiopathy. No age-advanced or lobar predominant atrophy. There is ar focus of chronic microhemorrhage in the left occipital lobe with associated low T1/T2-weighted signal structure measuring 3 mm in diameter. VASCULAR: Major intracranial arterial and venous sinus flow voids are preserved. SKULL AND UPPER CERVICAL SPINE: The visualized skull base, calvarium, upper cervical spine and extracranial soft tissues are normal. SINUSES/ORBITS: No fluid levels or advanced mucosal thickening. No mastoid or middle ear effusion. Normal orbits. IMPRESSION: 1. No acute intracranial abnormality. 2. 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 Electronically Signed   By: Ulyses Jarred M.D.   On: 07/26/2017 00:44   Dg Chest Port 1 View  Result Date: 07/26/2017 CLINICAL DATA:  Altered mental status EXAM: PORTABLE CHEST 1 VIEW COMPARISON:  None. FINDINGS: Heart size and pulmonary vascularity are normal. Emphysematous changes in the lungs. Central interstitial pattern with bronchial thickening  suggesting chronic bronchitis. Linear atelectasis or fibrosis in the right lung base. No focal consolidation. No blunting of costophrenic angles. No pneumothorax. Old right rib fractures. Calcification of the aorta. IMPRESSION: Emphysematous and chronic bronchitic changes in the lungs. Linear atelectasis or fibrosis in the right lung base. No focal consolidation. Aortic atherosclerosis. Electronically Signed   By: Lucienne Capers M.D.   On: 07/26/2017 02:40   Mr Jodene Nam Head Wo Contrast  Result Date: 07/26/2017 CLINICAL DATA:  TIA.  Confusion EXAM: MRA HEAD WITHOUT CONTRAST TECHNIQUE: Angiographic images of the Circle of Willis were obtained using MRA technique without intravenous contrast. COMPARISON:  MRI head 07/26/2017 FINDINGS: Both vertebral arteries patent to the basilar. Basilar widely patent with mild atherosclerotic irregularity. PICA not visualized  possibly due to patient positioning. AICA, superior cerebellar, and posterior cerebral arteries patent proximally. Mild stenosis distal right PCA and high-grade stenosis versus occlusion of the distal left PCA. Atherosclerotic irregularity and moderate stenosis in the cavernous carotid bilaterally. Anterior and middle cerebral arteries patent bilaterally. Atherosclerotic irregularity distal MCA branches bilaterally. Negative for cerebral aneurysm IMPRESSION: Intracranial atherosclerotic disease involving the posterior cerebral arteries and middle cerebral arteries bilaterally. Severe stenosis versus occlusion distal left PCA. Electronically Signed   By: Franchot Gallo M.D.   On: 07/26/2017 11:35    Lab Data:  CBC: Recent Labs  Lab 07/31/17 0853 08/02/17 0502  WBC 10.6* 7.7  NEUTROABS 9.1*  --   HGB 11.3* 10.6*  HCT 36.1 34.7*  MCV 80.6 81.5  PLT 315 546   Basic Metabolic Panel: Recent Labs  Lab 07/31/17 0853 07/31/17 1707 08/01/17 0426 08/02/17 0502  NA 139  --  137 137  K 3.5  --  3.2* 3.7  CL 103  --  106 106  CO2 24  --  22 22    GLUCOSE 145*  --  133* 116*  BUN 22*  --  23* 14  CREATININE 0.86  --  0.81 0.78  CALCIUM 9.2  --  8.6* 8.7*  MG  --  1.7  --   --   PHOS  --  4.1  --   --    GFR: Estimated Creatinine Clearance: 45.4 mL/min (by C-G formula based on SCr of 0.78 mg/dL). Liver Function Tests: Recent Labs  Lab 07/31/17 0853  AST 16  ALT 14  ALKPHOS 75  BILITOT 0.3  PROT 6.8  ALBUMIN 3.2*   No results for input(s): LIPASE, AMYLASE in the last 168 hours. No results for input(s): AMMONIA in the last 168 hours. Coagulation Profile: No results for input(s): INR, PROTIME in the last 168 hours. Cardiac Enzymes: Recent Labs  Lab 07/31/17 1707  CKTOTAL 48   BNP (last 3 results) No results for input(s): PROBNP in the last 8760 hours. HbA1C: No results for input(s): HGBA1C in the last 72 hours. CBG: Recent Labs  Lab 08/01/17 1338 08/01/17 1659 08/01/17 2116 08/02/17 0804 08/02/17 1223  GLUCAP 123* 88 110* 122* 114*   Lipid Profile: No results for input(s): CHOL, HDL, LDLCALC, TRIG, CHOLHDL, LDLDIRECT in the last 72 hours. Thyroid Function Tests: No results for input(s): TSH, T4TOTAL, FREET4, T3FREE, THYROIDAB in the last 72 hours. Anemia Panel: No results for input(s): VITAMINB12, FOLATE, FERRITIN, TIBC, IRON, RETICCTPCT in the last 72 hours. Urine analysis:    Component Value Date/Time   COLORURINE YELLOW 07/31/2017 1253   APPEARANCEUR CLOUDY (A) 07/31/2017 1253   LABSPEC 1.024 07/31/2017 1253   PHURINE 5.0 07/31/2017 1253   GLUCOSEU NEGATIVE 07/31/2017 1253   HGBUR NEGATIVE 07/31/2017 1253   BILIRUBINUR NEGATIVE 07/31/2017 1253   BILIRUBINUR n 03/14/2016 1227   KETONESUR 5 (A) 07/31/2017 1253   PROTEINUR 100 (A) 07/31/2017 1253   UROBILINOGEN negative 03/14/2016 1227   NITRITE NEGATIVE 07/31/2017 1253   LEUKOCYTESUR NEGATIVE 07/31/2017 1253     Darly Massi M.D. [facility] 08/02/2017, 12:54 PM  Pager: [phone] Between 7am to 7pm - call Pager - [phone]  After  7pm go to [email or web address] - password [email or web address]  Call night coverage person covering after 7pm

## 2017-08-02 NOTE — Progress Notes (Signed)
CSW spoke with patient's POA, [name]. She stated that [facility] will be coming to assess the patient but she is not sure they will accept her because patient is not ambulatory. She states that patient will have to go back to [facility] because they require 2 weeks notice before the patient can move out anyway. She reported disappointment that the patient is not participating in therapy and feels that patient will just "lay in the bed until she dies." CSW will contact [name] back at time of discharge to let her know of PTAR time.    Percell Locus Jaspreet Bodner LCSW [phone]

## 2017-08-02 NOTE — Care Management Note (Addendum)
Case Management Note  Patient Details  Name: Lisa Villegas MRN: [id number] Date of Birth: 08-Jul-1930  Subjective/Objective:          Syncopal episode. Resides @ ALF/[facility].   [name] (Legal Guardian)     [phone]         PCP:  [name]   Action/Plan:  Hoping to transfer back to ALF with home health services. Referral was made with [facility] P[facility]. CSW managing disposition back to ALF/[facility]. They need to come assess the patient for return... NCM will continue to follow for disposition needs  Expected Discharge Date:                  Expected Discharge Plan:  Assisted Living / Rest Home  In-House Referral:  Clinical Social Work  Discharge planning Services  CM Consult, Medication Assistance  Post Acute Care Choice:    Choice offered to:  Patient  DME Arranged:    DME Agency:     HH Arranged:  PT, OT, RN [place] Agency:  [facility] (formerly [employer])  Status of Service:  In process, will continue to follow  If discussed at Long Length of Stay Meetings, dates discussed:    Additional Comments:  Sharin Mons, RN 08/02/2017, 4:16 PM

## 2017-08-03 DIAGNOSIS — K209 Esophagitis, unspecified: Secondary | ICD-10-CM

## 2017-08-03 LAB — GLUCOSE, CAPILLARY
GLUCOSE-CAPILLARY: 121 mg/dL — AB (ref 65–99)
GLUCOSE-CAPILLARY: 151 mg/dL — AB (ref 65–99)
GLUCOSE-CAPILLARY: 107 mg/dL — AB (ref 65–99)

## 2017-08-03 MED ORDER — HYDRALAZINE HCL 10 MG PO TABS: 10.0000 mg | ORAL_TABLET | Freq: Three times a day (TID) | ORAL | 3 refills | Status: DC

## 2017-08-03 MED ORDER — OMEPRAZOLE 40 MG PO CPDR: 40.0000 mg | DELAYED_RELEASE_CAPSULE | Freq: Every day | ORAL | 3 refills | Status: DC

## 2017-08-03 MED ORDER — HYDRALAZINE HCL 10 MG PO TABS: 25.0000 mg | ORAL_TABLET | Freq: Three times a day (TID) | ORAL | 3 refills | Status: DC

## 2017-08-03 NOTE — Progress Notes (Signed)
Chaplain Note:  Assisted Patient in completing HCPOA - it was witnessed and nortarized. A copy was provide to nursing secretary to be placed in chart.  Wells Guiles

## 2017-08-03 NOTE — Progress Notes (Signed)
Katheren Shams to be D/C'd to [facility]F per MD order. Report called to [place], [place]. VVS, Skin clean, dry and intact without evidence of skin break down, no evidence of skin tears noted.  IV catheter discontinued intact. Site without signs and symptoms of complications. Dressing and pressure applied.  An After Visit Summary was printed and given to the patient.  Patient escorted via PTAR, and D/C to [facility] via stretcher.  Melonie [place]  08/03/2017 4:28 PM

## 2017-08-03 NOTE — Progress Notes (Addendum)
Per ALF, [facility], they are not going to assess patient and will just accept her back with palliative. CSW sent them DC Summary and FL2 for review. CSW updated POA, [name].  Percell Locus Sya Nestler LCSW [phone]

## 2017-08-03 NOTE — Clinical Social Work Note (Signed)
Clinical Social Worker facilitated patient discharge including contacting patient family and facility to confirm patient discharge plans.  Clinical information faxed to facility and family agreeable with plan.  CSW arranged ambulance transport via PTAR to [facility].  RN to call report prior to discharge.  Clinical Social Worker will sign off for now as social work intervention is no longer needed. Please consult [place] again if new need arises.  Barbette Or, [place]

## 2017-08-03 NOTE — NC FL2 (Addendum)
[place] LEVEL OF CARE SCREENING TOOL     IDENTIFICATION  Patient Name: Lisa Villegas Birthdate: [birth year] Sex: female Admission Date (Current Location): 07/31/2017  [facility] and [place] Number:  Herbalist and Address:  [facility] [place], [place] [address], [place], [place] [address]      Provider Number: [id number]  Attending Physician Name and Address:  Mendel Corning, MD  Relative Name and Phone Number:  Ivory Broad, [phone]    Current Level of Care: Hospital Recommended Level of Care: [place] Prior Approval Number:    Date Approved/Denied:   PASRR Number:    Discharge Plan: Other (Comment)(ALF)    Current Diagnoses: Patient Active Problem List   Diagnosis Date Noted  . Syncope 07/31/2017  . Chronic diastolic heart failure, NYHA class 2 ([place]) 07/31/2017  . Uncontrolled hypertension 07/31/2017  . Diabetes mellitus, controlled ([place]) 07/31/2017  . CKD (chronic kidney disease) stage 3, GFR 30-59 ml/min (HCC) 07/31/2017  . FTT (failure to thrive) in adult 07/31/2017  . History of Hodgkin's disease 07/31/2017  . Tremor of right hand/chronic &benign 07/31/2017  . Hypertensive urgency 07/26/2017  . Acute metabolic encephalopathy 32/95/1884  . GERD (gastroesophageal reflux disease) 07/25/2017  . Hyperkalemia 07/25/2017  . Anemia 11/30/2016  . Anxiety and depression   . Autism   . History of thyroid nodule   . Hodgkin disease ([place])   . HTN (hypertension), benign   . Incontinence   . Lack of sensation   . Poor balance   . CKD (chronic kidney disease), stage III ([place])   . Osteopenia   . Vitamin D deficiency   . Follow-up examination for injury 02/08/2016  . Type II diabetes mellitus with renal manifestations ([place]) 01/01/2016  . Hypertension 01/01/2016  . Incontinence of feces 01/01/2016  . Urinary incontinence without sensory awareness 01/01/2016  . Hyperlipidemia 01/01/2016    Orientation  RESPIRATION BLADDER Height & Weight     Self, Time, Place  Normal Incontinent Weight: 62.1 kg (136 lb 14.5 oz) Height:     BEHAVIORAL SYMPTOMS/MOOD NEUROLOGICAL BOWEL NUTRITION STATUS  (No behaviors but does have Autism)   Continent (Regular)  AMBULATORY STATUS COMMUNICATION OF NEEDS Skin   Extensive Assist Verbally Normal                       Personal Care Assistance Level of Assistance  Bathing, Feeding, Dressing Bathing Assistance: Maximum assistance Feeding assistance: Limited assistance Dressing Assistance: Limited assistance     Functional Limitations Info  Hearing   Hearing Info: Impaired      SPECIAL CARE FACTORS FREQUENCY  PT (By licensed PT), OT (By licensed OT)     PT Frequency: Home health 3x/week OT Frequency: Home health 3x/week            Contractures      Additional Factors Info  Code Status Allergies Code Status Info: DNR Allergies Info: Penicillins        Current Medications (08/03/2017):   Discharge Medications: TAKE these medications   aspirin 81 MG tablet Take 1 tablet (81 mg total) by mouth daily.   atorvastatin 20 MG tablet Commonly known as:  LIPITOR Take 1 tablet (20 mg total) by mouth every evening.   calcium carbonate 750 MG chewable tablet Commonly known as:  TUMS EX Chew 1 tablet by mouth daily.   CERTA-VITE PO Take 1 tablet by mouth daily.   clopidogrel 75 MG tablet Commonly known  as:  PLAVIX Take 1 tablet (75 mg total) by mouth daily.   CREST PRO-HEALTH MT Use as directed 1 Applicatorful in the mouth or throat 2 (two) times daily.   ferrous sulfate 325 (65 FE) MG tablet Take 325 mg by mouth daily.   fesoterodine 8 MG Tb24 tablet Commonly known as:  TOVIAZ TAKE 1 TAB BY MOUTH EVERY DAY.  DO NOT CRUSH OR CHEW .   GLUCERNA PO Take 1 Can by mouth 2 (two) times daily.   hydrALAZINE 10 MG tablet Commonly known as:  APRESOLINE Take 1 tablet (10 mg total) by mouth 3 (three) times daily. What  changed:    medication strength  how much to take  when to take this  reasons to take this  additional instructions   INTEGRA F 125-1 MG Caps Take 1 capsule by mouth daily.   magnesium chloride 64 MG Tbec SR tablet Commonly known as:  SLOW-MAG Take 1 tablet by mouth 2 (two) times daily.   metFORMIN 500 MG tablet Commonly known as:  GLUCOPHAGE Take 1,000 mg by mouth 2 (two) times daily.   omeprazole 40 MG capsule Commonly known as:  PRILOSEC Take 1 capsule (40 mg total) by mouth daily.   PREVIDENT 5000 BOOSTER PLUS DT Place 1 application onto teeth every evening.   verapamil 120 MG CR tablet Commonly known as:  CALAN-SR Take 120 mg by mouth at bedtime.   Vitamin D3 2000 units Tabs Take 1 tablet by mouth daily.      Relevant Imaging Results:  Relevant Lab Results:   Additional Information SSN: [id number]   Please have Palliative follow patient at facility.  Benard Halsted, LCSWA

## 2017-08-03 NOTE — Care Management Note (Signed)
Case Management Note  Patient Details  Name: Breniyah Romm MRN: [id number] Date of Birth: Dec 03, 1930  Subjective/Objective:                    Action/Plan:  Tiffany with [facility] aware discharge today.  Expected Discharge Date:  08/03/17               Expected Discharge Plan:  Assisted Living / Rest Home  In-House Referral:  Clinical Social Work  Discharge planning Services  CM Consult, Medication Assistance  Post Acute Care Choice:  Home Health Choice offered to:  Patient  DME Arranged:  N/A DME Agency:  NA  HH Arranged:  PT, OT, RN [place] Agency:  [facility] (formerly [employer])  Status of Service:  Completed, signed off  If discussed at [employer], dates discussed:    Additional Comments:  Marilu Favre, RN 08/03/2017, 10:29 AM

## 2017-08-03 NOTE — Progress Notes (Signed)
Initial Nutrition Assessment  DOCUMENTATION CODES:   Not applicable  INTERVENTION:  Patient is discharging.  Would benefit from glucerna shake BID, each supplement provides 220 calories and 10 grams of protein  NUTRITION DIAGNOSIS:   Increased nutrient needs related to chronic illness as evidenced by estimated needs.  GOAL:   Patient will meet greater than or equal to 90% of their needs  MONITOR:   PO intake, Labs, Weight trends  ASSESSMENT:   Amenah Tucci is a 84 y.o. female with a Past Medical History of HTN; HLD; remote Hodgkin's disease; DM; stage 3 CKD; and chronic CHF  who presents with AMS, acute encephalopathy, recent TIA, Syncope  RD consulted for nutrition assessment. Spoke with HCPOA at bedside, patient able to answer some questions appropriately, most history comes from [place], [place].  She reports patient had a UBW of 136 pounds before discharging to her assisted living facility following previous admission.  She believes patient was 128 pounds upon admit here, but this RD was unable to verify this. Weighed patient via bed scale, she was 60.8kg (134 pounds).  [name] states patient will normally eat cheerios, 2-3 hard boiled eggs, toast, soy milk, an orange and a banana for breakfast, a peanut butter sandwich for lunch, and fish, a vegetable, and starch for dinner.  Patient stopped eating at her facility following her TIA per [name], but PO intake has improved since being admitted, had a muffin and a hardboiled egg with juice this morning.  Documented meal completion: 75%  She also reports patient has been refusing to stand, exhibiting muscle weakness. Does not exhibit muscle wasting in lower extremities at this time.  Discharging today.  Labs reviewed Medications reviewed and include:  Glucophage, MVI liquid  NUTRITION - FOCUSED PHYSICAL EXAM:  Mild muscle wasting, mild fat depletions, no edema  Diet Order:   Diet Order           Diet Carb Modified         Diet gluten free Room service appropriate? Yes; Fluid consistency: Thin  Diet effective now          EDUCATION NEEDS:   Not appropriate for education at this time  Skin:  Skin Assessment: Reviewed RN Assessment(ecchymosis to L Lower back)  Last BM:  PTA  Height:   Ht Readings from Last 1 Encounters:  07/26/17 5\' 2"  (1.575 m)    Weight:   Wt Readings from Last 1 Encounters:  08/02/17 136 lb 14.5 oz (62.1 kg)    Ideal Body Weight:  50 kg  BMI:  Body mass index is 25.04 kg/m.  Estimated Nutritional Needs:   Kcal:  1250-1400 calories  Protein:  74-86 grams (1.2-1.4g/kg)  Fluid:  >1.5L  Satira Anis. Bonnell Placzek, MS, RD LDN Inpatient Clinical Dietitian Pager [phone]

## 2017-08-03 NOTE — Discharge Summary (Signed)
Physician Discharge Summary   Patient ID: Lisa Villegas MRN: [id number] DOB/AGE: Aug 21, 1930 84 y.o.  Admit date: 07/31/2017 Discharge date: 08/03/2017  Primary Care Physician:  [name], MD   Recommendations for Outpatient Follow-up:  1. Follow up with PCP in 1-2 weeks Please obtain BMP/CBC in one week Recommend palliative care to follow at the facility  Home Health: Home health PT, OT Equipment/Devices: None  Discharge Condition: stable CODE STATUS: FULL  Diet recommendation: Heart healthy diet   Discharge Diagnoses:    . Vasovagal syncope  . Chronic diastolic heart failure, NYHA class 2 ([place]) . Uncontrolled hypertension . CKD (chronic kidney disease) stage 3, GFR 30-59 ml/min (HCC) . FTT (failure to thrive) in adult . Tremor of right hand/chronic &benign    Cognitive deficit with autism   Consults: None    Allergies:   Allergies  Allergen Reactions  . Penicillins Rash    Has patient had a PCN reaction causing immediate rash, facial/tongue/throat swelling, SOB or lightheadedness with hypotension: yes Has patient had a PCN reaction causing severe rash involving mucus membranes or skin necrosis: unk Has patient had a PCN reaction that required hospitalization: unk Has patient had a PCN reaction occurring within the last 10 years: unk If all of the above answers are "NO", then may proceed with Cephalosporin use.      DISCHARGE MEDICATIONS: Allergies as of 08/03/2017      Reactions   Penicillins Rash   Has patient had a PCN reaction causing immediate rash, facial/tongue/throat swelling, SOB or lightheadedness with hypotension: yes Has patient had a PCN reaction causing severe rash involving mucus membranes or skin necrosis: unk Has patient had a PCN reaction that required hospitalization: unk Has patient had a PCN reaction occurring within the last 10 years: unk If all of the above answers are "NO", then may proceed with Cephalosporin use.       Medication List    TAKE these medications   aspirin 81 MG tablet Take 1 tablet (81 mg total) by mouth daily.   atorvastatin 20 MG tablet Commonly known as:  LIPITOR Take 1 tablet (20 mg total) by mouth every evening.   calcium carbonate 750 MG chewable tablet Commonly known as:  TUMS EX Chew 1 tablet by mouth daily.   CERTA-VITE PO Take 1 tablet by mouth daily.   clopidogrel 75 MG tablet Commonly known as:  PLAVIX Take 1 tablet (75 mg total) by mouth daily.   CREST PRO-HEALTH MT Use as directed 1 Applicatorful in the mouth or throat 2 (two) times daily.   ferrous sulfate 325 (65 FE) MG tablet Take 325 mg by mouth daily.   fesoterodine 8 MG Tb24 tablet Commonly known as:  TOVIAZ TAKE 1 TAB BY MOUTH EVERY DAY.  DO NOT CRUSH OR CHEW .   GLUCERNA PO Take 1 Can by mouth 2 (two) times daily.   hydrALAZINE 10 MG tablet Commonly known as:  APRESOLINE Take 1 tablet (10 mg total) by mouth 3 (three) times daily. What changed:    medication strength  how much to take  when to take this  reasons to take this  additional instructions   INTEGRA F 125-1 MG Caps Take 1 capsule by mouth daily.   magnesium chloride 64 MG Tbec SR tablet Commonly known as:  SLOW-MAG Take 1 tablet by mouth 2 (two) times daily.   metFORMIN 500 MG tablet Commonly known as:  GLUCOPHAGE Take 1,000 mg by mouth 2 (two) times daily.   omeprazole  40 MG capsule Commonly known as:  PRILOSEC Take 1 capsule (40 mg total) by mouth daily.   PREVIDENT 5000 BOOSTER PLUS DT Place 1 application onto teeth every evening.   verapamil 120 MG CR tablet Commonly known as:  CALAN-SR Take 120 mg by mouth at bedtime.   Vitamin D3 2000 units Tabs Take 1 tablet by mouth daily.        Brief H and P: For complete details please refer to admission H and P, but in brief 84 year old female patient, resides at ALF, PMH of autism, DM 2, HTN, HLD, stage III chronic kidney disease, chronic diastolic CHF,  anxiety and depression, GERD, Hodgkin's disease in remission, iron deficiency anemia, recent hospitalization 07/25/2017-07/26/2017 when she presented with confusion, right leg weakness, speech difficulties and was assessed to have acute metabolic encephalopathy and TIA, case was discussed with neurology who recommended DAPT x3 weeks followed by aspirin alone, now presented to ED on 07/31/2017 after a witnessed syncopal episode while ambulating with staff at facility when she suddenly went limp and unresponsive for about 1 minute, eyes rolled back and apparently had some left-sided drooping of her mouth. CT head and chest x-ray without acute findings.   Hospital Course:    Syncope: Likely vasovagal -Patient had a witnessed syncopal event with ambulation at ALF, no orthostasis. -Telemetry showed normal sinus rhythm, no arrhythmias -EEG showed abnormal due to background slowing and disorganization, no definite epileptiform changes -CT head showed no acute abnormality -2D echo 5/1 showed EF of 65 to 70% with grade 2 diastolic dysfunction -Carotid Dopplers bilateral 1 to 39% ICA stenosis.    Recent TIA Work-up complete, continue dual antiplatelet agents for 3 weeks followed by aspirin alone, outpatient follow-up with neurology  Essential hypertension -BP improving, continue verapamil, added hydralazine 10 mg 3 times daily     Chronic diastolic heart failure, NYHA class 2 (HCC) -2D echo showed EF of 65 to 70% and grade 2 diastolic dysfunction, currently compensated    Diabetes mellitus, controlled ([place]) -Continue sliding scale insulin, CBGs controlled    CKD (chronic kidney disease) stage 3, GFR 30-59 ml/min (HCC) -Creatinine stable, 0.7 at discharge  Autism/cognitive impairment, chronic tremor of right hand -Currently appears to be at baseline  Adult failure to thrive -Recommend palliative care to follow at the facility  Day of Discharge S: No new issues, stable, no  complaints  BP (!) 149/57 (BP Location: Right Arm)   Pulse 72   Temp 98.7 F (37.1 C)   Resp 16   Wt 62.1 kg (136 lb 14.5 oz)   SpO2 100%   BMI 25.04 kg/m   Physical Exam: General: Alert and awake, at baseline  not in any acute distress. HEENT: anicteric sclera, pupils reactive to light and accommodation CVS: S1-S2 clear no murmur rubs or gallops Chest: clear to auscultation bilaterally, no wheezing rales or rhonchi Abdomen: soft nontender, nondistended, normal bowel sounds Extremities: no cyanosis, clubbing or edema noted bilaterally    The results of significant diagnostics from this hospitalization (including imaging, microbiology, ancillary and laboratory) are listed below for reference.      Procedures/Studies:  Dg Chest 2 View  Result Date: 07/31/2017 CLINICAL DATA:  Loss of consciousness this morning, history CHF, diabetes mellitus, hypertension, stage III chronic kidney disease EXAM: CHEST - 2 VIEW COMPARISON:  Portable exam of 07/26/2017 FINDINGS: Rotated to the LEFT. Normal heart size, mediastinal contours, and pulmonary vascularity. Atherosclerotic calcifications aorta. Bibasilar atelectasis. Lungs otherwise clear. No pleural effusion or pneumothorax. Bones  demineralized. IMPRESSION: Bibasilar atelectasis. Electronically Signed   By: Lavonia Dana M.D.   On: 07/31/2017 10:34   Ct Head Wo Contrast  Result Date: 07/31/2017 CLINICAL DATA:  Seizure, nontraumatic. EXAM: CT HEAD WITHOUT CONTRAST TECHNIQUE: Contiguous axial images were obtained from the base of the skull through the vertex without intravenous contrast. COMPARISON:  07/25/2017 head CT. FINDINGS: Brain: Stable tiny left basal ganglia lacune. No evidence of parenchymal hemorrhage or extra-axial fluid collection. No mass lesion, mass effect, or midline shift. No CT evidence of acute infarction. Generalized cerebral volume loss. Nonspecific mild subcortical and periventricular white matter hypodensity, most in keeping  with chronic small vessel ischemic change. No ventriculomegaly. Vascular: No acute abnormality. Skull: No evidence of calvarial fracture. Sinuses/Orbits: Minimal partial opacification of the left sphenoid sinus. No fluid levels. Other:  The mastoid air cells are unopacified. IMPRESSION: 1.  No evidence of acute intracranial abnormality. 2. Generalized cerebral volume loss, stable tiny left basal ganglia lacune and mild chronic small vessel ischemic changes in the cerebral white matter. Electronically Signed   By: Ilona Sorrel M.D.   On: 07/31/2017 10:28   Ct Head Wo Contrast  Result Date: 07/25/2017 CLINICAL DATA:  Unable wall. EXAM: CT HEAD WITHOUT CONTRAST TECHNIQUE: Contiguous axial images were obtained from the base of the skull through the vertex without intravenous contrast. COMPARISON:  CT scan of February 05, 2016. FINDINGS: Brain: Mild diffuse cortical atrophy is noted. Mild chronic ischemic white matter disease is noted. No mass effect or midline shift is noted. Ventricular size is within normal limits. There is no evidence of mass lesion, hemorrhage or acute infarction. Vascular: No hyperdense vessel or unexpected calcification. Skull: Normal. Negative for fracture or focal lesion. Sinuses/Orbits: Left sphenoid sinusitis. Other: None. IMPRESSION: Mild diffuse cortical atrophy. Mild chronic ischemic white matter disease. No acute intracranial abnormality seen. Electronically Signed   By: Marijo Conception, M.D.   On: 07/25/2017 19:35   Mr Brain Wo Contrast  Result Date: 07/26/2017 CLINICAL DATA:  Altered mental status EXAM: MRI HEAD WITHOUT CONTRAST TECHNIQUE: Multiplanar, multiecho pulse sequences of the brain and surrounding structures were obtained without intravenous contrast. COMPARISON:  Head CT 07/25/2017 FINDINGS: BRAIN: The midline structures are normal. There is no acute infarct or acute hemorrhage. No mass lesion, hydrocephalus, dural abnormality or extra-axial collection. Multifocal white  matter hyperintensity, most commonly due to chronic ischemic microangiopathy. No age-advanced or lobar predominant atrophy. There is ar focus of chronic microhemorrhage in the left occipital lobe with associated low T1/T2-weighted signal structure measuring 3 mm in diameter. VASCULAR: Major intracranial arterial and venous sinus flow voids are preserved. SKULL AND UPPER CERVICAL SPINE: The visualized skull base, calvarium, upper cervical spine and extracranial soft tissues are normal. SINUSES/ORBITS: No fluid levels or advanced mucosal thickening. No mastoid or middle ear effusion. Normal orbits. IMPRESSION: 1. No acute intracranial abnormality. 2. 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 Electronically Signed   By: Ulyses Jarred M.D.   On: 07/26/2017 00:44   Dg Chest Port 1 View  Result Date: 07/26/2017 CLINICAL DATA:  Altered mental status EXAM: PORTABLE CHEST 1 VIEW COMPARISON:  None. FINDINGS: Heart size and pulmonary vascularity are normal. Emphysematous changes in the lungs. Central interstitial pattern with bronchial thickening suggesting chronic bronchitis. Linear atelectasis or fibrosis in the right lung base. No focal consolidation. No blunting of costophrenic angles. No pneumothorax. Old right rib fractures. Calcification of the aorta. IMPRESSION: Emphysematous and chronic bronchitic changes in the lungs.  Linear atelectasis or fibrosis in the right lung base. No focal consolidation. Aortic atherosclerosis. Electronically Signed   By: Lucienne Capers M.D.   On: 07/26/2017 02:40   Mr Jodene Nam Head Wo Contrast  Result Date: 07/26/2017 CLINICAL DATA:  TIA.  Confusion EXAM: MRA HEAD WITHOUT CONTRAST TECHNIQUE: Angiographic images of the Circle of Willis were obtained using MRA technique without intravenous contrast. COMPARISON:  MRI head 07/26/2017 FINDINGS: Both vertebral arteries patent to the basilar. Basilar widely patent with mild atherosclerotic irregularity.  PICA not visualized possibly due to patient positioning. AICA, superior cerebellar, and posterior cerebral arteries patent proximally. Mild stenosis distal right PCA and high-grade stenosis versus occlusion of the distal left PCA. Atherosclerotic irregularity and moderate stenosis in the cavernous carotid bilaterally. Anterior and middle cerebral arteries patent bilaterally. Atherosclerotic irregularity distal MCA branches bilaterally. Negative for cerebral aneurysm IMPRESSION: Intracranial atherosclerotic disease involving the posterior cerebral arteries and middle cerebral arteries bilaterally. Severe stenosis versus occlusion distal left PCA. Electronically Signed   By: Franchot Gallo M.D.   On: 07/26/2017 11:35       LAB RESULTS: Basic Metabolic Panel: Recent Labs  Lab 07/31/17 1707 08/01/17 0426 08/02/17 0502  NA  --  137 137  K  --  3.2* 3.7  CL  --  106 106  CO2  --  22 22  GLUCOSE  --  133* 116*  BUN  --  23* 14  CREATININE  --  0.81 0.78  CALCIUM  --  8.6* 8.7*  MG 1.7  --   --   PHOS 4.1  --   --    Liver Function Tests: Recent Labs  Lab 07/31/17 0853  AST 16  ALT 14  ALKPHOS 75  BILITOT 0.3  PROT 6.8  ALBUMIN 3.2*   No results for input(s): LIPASE, AMYLASE in the last 168 hours. No results for input(s): AMMONIA in the last 168 hours. CBC: Recent Labs  Lab 07/31/17 0853 08/02/17 0502  WBC 10.6* 7.7  NEUTROABS 9.1*  --   HGB 11.3* 10.6*  HCT 36.1 34.7*  MCV 80.6 81.5  PLT 315 326   Cardiac Enzymes: Recent Labs  Lab 07/31/17 1707  CKTOTAL 48   BNP: Invalid input(s): POCBNP CBG: Recent Labs  Lab 08/03/17 0752 08/03/17 1132  GLUCAP 121* 151*      Disposition and Follow-up: Discharge Instructions    Diet Carb Modified   Complete by:  As directed    Increase activity slowly   Complete by:  As directed        DISPOSITION: ALF with home health PT [place]    [name], MD. Schedule an  appointment as soon as possible for a visit in 2 week(s).   Specialty:  Internal Medicine Contact information: [address] [place] [address] [phone]            Time coordinating discharge:  35 minutes  Signed:   Estill Cotta M.D. [facility] 08/03/2017, 11:52 AM Pager: [phone]

## 2017-08-06 DIAGNOSIS — D631 Anemia in chronic kidney disease: Secondary | ICD-10-CM | POA: Diagnosis not present

## 2017-08-06 DIAGNOSIS — E1122 Type 2 diabetes mellitus with diabetic chronic kidney disease: Secondary | ICD-10-CM | POA: Diagnosis not present

## 2017-08-06 DIAGNOSIS — N183 Chronic kidney disease, stage 3 (moderate): Secondary | ICD-10-CM | POA: Diagnosis not present

## 2017-08-06 DIAGNOSIS — Z8673 Personal history of transient ischemic attack (TIA), and cerebral infarction without residual deficits: Secondary | ICD-10-CM | POA: Diagnosis not present

## 2017-08-06 DIAGNOSIS — I509 Heart failure, unspecified: Secondary | ICD-10-CM | POA: Diagnosis not present

## 2017-08-06 DIAGNOSIS — I130 Hypertensive heart and chronic kidney disease with heart failure and stage 1 through stage 4 chronic kidney disease, or unspecified chronic kidney disease: Secondary | ICD-10-CM | POA: Diagnosis not present

## 2017-08-09 DIAGNOSIS — D631 Anemia in chronic kidney disease: Secondary | ICD-10-CM | POA: Diagnosis not present

## 2017-08-09 DIAGNOSIS — E1122 Type 2 diabetes mellitus with diabetic chronic kidney disease: Secondary | ICD-10-CM | POA: Diagnosis not present

## 2017-08-09 DIAGNOSIS — N183 Chronic kidney disease, stage 3 (moderate): Secondary | ICD-10-CM | POA: Diagnosis not present

## 2017-08-09 DIAGNOSIS — I509 Heart failure, unspecified: Secondary | ICD-10-CM | POA: Diagnosis not present

## 2017-08-09 DIAGNOSIS — Z8673 Personal history of transient ischemic attack (TIA), and cerebral infarction without residual deficits: Secondary | ICD-10-CM | POA: Diagnosis not present

## 2017-08-09 DIAGNOSIS — I130 Hypertensive heart and chronic kidney disease with heart failure and stage 1 through stage 4 chronic kidney disease, or unspecified chronic kidney disease: Secondary | ICD-10-CM | POA: Diagnosis not present

## 2017-08-11 DIAGNOSIS — R410 Disorientation, unspecified: Secondary | ICD-10-CM | POA: Diagnosis not present

## 2017-08-11 DIAGNOSIS — Z09 Encounter for follow-up examination after completed treatment for conditions other than malignant neoplasm: Secondary | ICD-10-CM | POA: Diagnosis not present

## 2017-08-11 DIAGNOSIS — F840 Autistic disorder: Secondary | ICD-10-CM | POA: Diagnosis not present

## 2017-08-11 DIAGNOSIS — E1122 Type 2 diabetes mellitus with diabetic chronic kidney disease: Secondary | ICD-10-CM | POA: Diagnosis not present

## 2017-08-11 DIAGNOSIS — I129 Hypertensive chronic kidney disease with stage 1 through stage 4 chronic kidney disease, or unspecified chronic kidney disease: Secondary | ICD-10-CM | POA: Diagnosis not present

## 2017-08-11 DIAGNOSIS — D649 Anemia, unspecified: Secondary | ICD-10-CM | POA: Diagnosis not present

## 2017-08-11 DIAGNOSIS — F339 Major depressive disorder, recurrent, unspecified: Secondary | ICD-10-CM | POA: Diagnosis not present

## 2017-08-14 DIAGNOSIS — Z8673 Personal history of transient ischemic attack (TIA), and cerebral infarction without residual deficits: Secondary | ICD-10-CM | POA: Diagnosis not present

## 2017-08-14 DIAGNOSIS — I130 Hypertensive heart and chronic kidney disease with heart failure and stage 1 through stage 4 chronic kidney disease, or unspecified chronic kidney disease: Secondary | ICD-10-CM | POA: Diagnosis not present

## 2017-08-14 DIAGNOSIS — N183 Chronic kidney disease, stage 3 (moderate): Secondary | ICD-10-CM | POA: Diagnosis not present

## 2017-08-14 DIAGNOSIS — D631 Anemia in chronic kidney disease: Secondary | ICD-10-CM | POA: Diagnosis not present

## 2017-08-14 DIAGNOSIS — I509 Heart failure, unspecified: Secondary | ICD-10-CM | POA: Diagnosis not present

## 2017-08-14 DIAGNOSIS — E1122 Type 2 diabetes mellitus with diabetic chronic kidney disease: Secondary | ICD-10-CM | POA: Diagnosis not present

## 2017-08-16 DIAGNOSIS — D631 Anemia in chronic kidney disease: Secondary | ICD-10-CM | POA: Diagnosis not present

## 2017-08-16 DIAGNOSIS — Z8673 Personal history of transient ischemic attack (TIA), and cerebral infarction without residual deficits: Secondary | ICD-10-CM | POA: Diagnosis not present

## 2017-08-16 DIAGNOSIS — I509 Heart failure, unspecified: Secondary | ICD-10-CM | POA: Diagnosis not present

## 2017-08-16 DIAGNOSIS — E1122 Type 2 diabetes mellitus with diabetic chronic kidney disease: Secondary | ICD-10-CM | POA: Diagnosis not present

## 2017-08-16 DIAGNOSIS — N183 Chronic kidney disease, stage 3 (moderate): Secondary | ICD-10-CM | POA: Diagnosis not present

## 2017-08-16 DIAGNOSIS — I130 Hypertensive heart and chronic kidney disease with heart failure and stage 1 through stage 4 chronic kidney disease, or unspecified chronic kidney disease: Secondary | ICD-10-CM | POA: Diagnosis not present

## 2017-08-18 ENCOUNTER — Observation Stay (HOSPITAL_COMMUNITY)
Admission: EM | Admit: 2017-08-18 | Discharge: 2017-08-19 | Disposition: A | Discharge status: Home / Self Care | Payer: Medicare Other | Attending: Family Medicine | Admitting: Family Medicine

## 2017-08-18 ENCOUNTER — Other Ambulatory Visit: Payer: Self-pay

## 2017-08-18 DIAGNOSIS — R4182 Altered mental status, unspecified: Secondary | ICD-10-CM | POA: Diagnosis not present

## 2017-08-18 DIAGNOSIS — E861 Hypovolemia: Secondary | ICD-10-CM

## 2017-08-18 DIAGNOSIS — C8190 Hodgkin lymphoma, unspecified, unspecified site: Secondary | ICD-10-CM | POA: Diagnosis present

## 2017-08-18 DIAGNOSIS — I5032 Chronic diastolic (congestive) heart failure: Secondary | ICD-10-CM | POA: Diagnosis not present

## 2017-08-18 DIAGNOSIS — I10 Essential (primary) hypertension: Secondary | ICD-10-CM | POA: Diagnosis not present

## 2017-08-18 DIAGNOSIS — F840 Autistic disorder: Secondary | ICD-10-CM | POA: Diagnosis present

## 2017-08-18 DIAGNOSIS — F419 Anxiety disorder, unspecified: Secondary | ICD-10-CM | POA: Diagnosis not present

## 2017-08-18 DIAGNOSIS — R627 Adult failure to thrive: Secondary | ICD-10-CM | POA: Diagnosis present

## 2017-08-18 DIAGNOSIS — I130 Hypertensive heart and chronic kidney disease with heart failure and stage 1 through stage 4 chronic kidney disease, or unspecified chronic kidney disease: Secondary | ICD-10-CM | POA: Insufficient documentation

## 2017-08-18 DIAGNOSIS — F32A Depression, unspecified: Secondary | ICD-10-CM | POA: Diagnosis present

## 2017-08-18 DIAGNOSIS — E785 Hyperlipidemia, unspecified: Secondary | ICD-10-CM | POA: Diagnosis not present

## 2017-08-18 DIAGNOSIS — E1122 Type 2 diabetes mellitus with diabetic chronic kidney disease: Secondary | ICD-10-CM | POA: Diagnosis not present

## 2017-08-18 DIAGNOSIS — R159 Full incontinence of feces: Secondary | ICD-10-CM | POA: Diagnosis present

## 2017-08-18 DIAGNOSIS — Z79899 Other long term (current) drug therapy: Secondary | ICD-10-CM | POA: Insufficient documentation

## 2017-08-18 DIAGNOSIS — F329 Major depressive disorder, single episode, unspecified: Secondary | ICD-10-CM | POA: Diagnosis not present

## 2017-08-18 DIAGNOSIS — E119 Type 2 diabetes mellitus without complications: Secondary | ICD-10-CM

## 2017-08-18 DIAGNOSIS — Z7984 Long term (current) use of oral hypoglycemic drugs: Secondary | ICD-10-CM | POA: Insufficient documentation

## 2017-08-18 DIAGNOSIS — Z7982 Long term (current) use of aspirin: Secondary | ICD-10-CM | POA: Insufficient documentation

## 2017-08-18 DIAGNOSIS — E0822 Diabetes mellitus due to underlying condition with diabetic chronic kidney disease: Secondary | ICD-10-CM

## 2017-08-18 DIAGNOSIS — N179 Acute kidney failure, unspecified: Secondary | ICD-10-CM | POA: Diagnosis not present

## 2017-08-18 DIAGNOSIS — D649 Anemia, unspecified: Secondary | ICD-10-CM | POA: Diagnosis not present

## 2017-08-18 DIAGNOSIS — I9589 Other hypotension: Secondary | ICD-10-CM

## 2017-08-18 DIAGNOSIS — N3942 Incontinence without sensory awareness: Secondary | ICD-10-CM | POA: Diagnosis not present

## 2017-08-18 DIAGNOSIS — N183 Chronic kidney disease, stage 3 unspecified: Secondary | ICD-10-CM | POA: Diagnosis present

## 2017-08-18 DIAGNOSIS — I959 Hypotension, unspecified: Secondary | ICD-10-CM | POA: Diagnosis present

## 2017-08-18 DIAGNOSIS — E1140 Type 2 diabetes mellitus with diabetic neuropathy, unspecified: Secondary | ICD-10-CM | POA: Insufficient documentation

## 2017-08-18 DIAGNOSIS — E1121 Type 2 diabetes mellitus with diabetic nephropathy: Secondary | ICD-10-CM | POA: Diagnosis not present

## 2017-08-18 DIAGNOSIS — E1129 Type 2 diabetes mellitus with other diabetic kidney complication: Secondary | ICD-10-CM | POA: Diagnosis present

## 2017-08-18 DIAGNOSIS — R031 Nonspecific low blood-pressure reading: Secondary | ICD-10-CM | POA: Diagnosis not present

## 2017-08-18 LAB — CBC WITH DIFFERENTIAL/PLATELET
Abs Immature Granulocytes: 0.2 10*3/uL — ABNORMAL HIGH (ref 0.0–0.1)
BASOS ABS: 0.1 10*3/uL (ref 0.0–0.1)
BASOS PCT: 0 %
EOS ABS: 0 10*3/uL (ref 0.0–0.7)
EOS PCT: 0 %
HCT: 38.5 % (ref 36.0–46.0)
Hemoglobin: 11.3 g/dL — ABNORMAL LOW (ref 12.0–15.0)
Immature Granulocytes: 1 %
LYMPHS PCT: 7 %
Lymphs Abs: 1 10*3/uL (ref 0.7–4.0)
MCH: 24.4 pg — AB (ref 26.0–34.0)
MCHC: 29.4 g/dL — AB (ref 30.0–36.0)
MCV: 83 fL (ref 78.0–100.0)
MONO ABS: 0.6 10*3/uL (ref 0.1–1.0)
Monocytes Relative: 4 %
Neutro Abs: 12.5 10*3/uL — ABNORMAL HIGH (ref 1.7–7.7)
Neutrophils Relative %: 88 %
PLATELETS: 372 10*3/uL (ref 150–400)
RBC: 4.64 MIL/uL (ref 3.87–5.11)
RDW: 17 % — AB (ref 11.5–15.5)
WBC: 14.4 10*3/uL — AB (ref 4.0–10.5)

## 2017-08-18 LAB — COMPREHENSIVE METABOLIC PANEL
ALK PHOS: 124 U/L (ref 38–126)
ALT: 19 U/L (ref 14–54)
AST: 22 U/L (ref 15–41)
Albumin: 3.4 g/dL — ABNORMAL LOW (ref 3.5–5.0)
Anion gap: 16 — ABNORMAL HIGH (ref 5–15)
BILIRUBIN TOTAL: 0.4 mg/dL (ref 0.3–1.2)
BUN: 25 mg/dL — ABNORMAL HIGH (ref 6–20)
CALCIUM: 9.4 mg/dL (ref 8.9–10.3)
CO2: 18 mmol/L — ABNORMAL LOW (ref 22–32)
CREATININE: 1.33 mg/dL — AB (ref 0.44–1.00)
Chloride: 104 mmol/L (ref 101–111)
GFR calc Af Amer: 41 mL/min — ABNORMAL LOW (ref >60)
GFR, EST NON AFRICAN AMERICAN: 36 mL/min — AB (ref >60)
GLUCOSE: 169 mg/dL — AB (ref 65–99)
POTASSIUM: 4.7 mmol/L (ref 3.5–5.1)
Sodium: 138 mmol/L (ref 135–145)
TOTAL PROTEIN: 6.7 g/dL (ref 6.5–8.1)

## 2017-08-18 LAB — URINALYSIS, ROUTINE W REFLEX MICROSCOPIC
Bilirubin Urine: NEGATIVE
GLUCOSE, UA: NEGATIVE mg/dL
HGB URINE DIPSTICK: NEGATIVE
Ketones, ur: NEGATIVE mg/dL
Leukocytes, UA: NEGATIVE
Nitrite: NEGATIVE
PROTEIN: 30 mg/dL — AB
SPECIFIC GRAVITY, URINE: 1.015 (ref 1.005–1.030)
pH: 5 (ref 5.0–8.0)

## 2017-08-18 LAB — POC OCCULT BLOOD, ED: Fecal Occult Bld: NEGATIVE

## 2017-08-18 LAB — TROPONIN I

## 2017-08-18 MED ORDER — INTEGRA F 125-1 MG PO CAPS: 1.0000 | ORAL_CAPSULE | Freq: Every day | ORAL | Status: DC

## 2017-08-18 MED ORDER — VITAMIN D 1000 UNITS PO TABS
1000.0000 [IU] | ORAL_TABLET | Freq: Every day | ORAL | Status: DC
  Administered 2017-08-19: 1000 [IU] via ORAL
  Filled 2017-08-18 (×2): qty 1

## 2017-08-18 MED ORDER — PANTOPRAZOLE SODIUM 40 MG PO TBEC
40.0000 mg | DELAYED_RELEASE_TABLET | Freq: Every day | ORAL | Status: DC
  Administered 2017-08-19: 40 mg via ORAL
  Filled 2017-08-18: qty 1

## 2017-08-18 MED ORDER — FESOTERODINE FUMARATE ER 4 MG PO TB24
4.0000 mg | ORAL_TABLET | Freq: Every day | ORAL | Status: DC
  Administered 2017-08-19: 4 mg via ORAL
  Filled 2017-08-18: qty 1

## 2017-08-18 MED ORDER — MAGNESIUM CHLORIDE 64 MG PO TBEC
1.0000 | DELAYED_RELEASE_TABLET | Freq: Two times a day (BID) | ORAL | Status: DC
  Administered 2017-08-19: 64 mg via ORAL
  Filled 2017-08-18 (×2): qty 1

## 2017-08-18 MED ORDER — BISACODYL 10 MG RE SUPP: 10.0000 mg | Freq: Every day | RECTAL | Status: DC | PRN

## 2017-08-18 MED ORDER — ASPIRIN EC 81 MG PO TBEC
81.0000 mg | DELAYED_RELEASE_TABLET | Freq: Every day | ORAL | Status: DC
  Administered 2017-08-19: 81 mg via ORAL
  Filled 2017-08-18: qty 1

## 2017-08-18 MED ORDER — SENNOSIDES-DOCUSATE SODIUM 8.6-50 MG PO TABS: 1.0000 | ORAL_TABLET | Freq: Every evening | ORAL | Status: DC | PRN

## 2017-08-18 MED ORDER — ACETAMINOPHEN 325 MG PO TABS: 650.0000 mg | ORAL_TABLET | Freq: Four times a day (QID) | ORAL | Status: DC | PRN

## 2017-08-18 MED ORDER — ATORVASTATIN CALCIUM 20 MG PO TABS
20.0000 mg | ORAL_TABLET | Freq: Every evening | ORAL | Status: DC
  Administered 2017-08-18 – 2017-08-19 (×2): 20 mg via ORAL
  Filled 2017-08-18 (×2): qty 1

## 2017-08-18 MED ORDER — GLUCERNA PO LIQD: 237.0000 mL | Freq: Two times a day (BID) | ORAL | Status: DC

## 2017-08-18 MED ORDER — ONDANSETRON HCL 4 MG/2ML IJ SOLN: 4.0000 mg | Freq: Four times a day (QID) | INTRAMUSCULAR | Status: DC | PRN

## 2017-08-18 MED ORDER — GLUCERNA SHAKE PO LIQD
237.0000 mL | Freq: Two times a day (BID) | ORAL | Status: DC
  Administered 2017-08-19 (×2): 237 mL via ORAL

## 2017-08-18 MED ORDER — SODIUM CHLORIDE 0.9 % IV SOLN
INTRAVENOUS | Status: DC
  Administered 2017-08-19: 04:00:00 via INTRAVENOUS

## 2017-08-18 MED ORDER — CALCIUM CARBONATE ANTACID 500 MG PO CHEW
1.5000 | CHEWABLE_TABLET | Freq: Every day | ORAL | Status: DC
  Administered 2017-08-19 (×2): 300 mg via ORAL
  Filled 2017-08-18: qty 2
  Filled 2017-08-18: qty 1.5

## 2017-08-18 MED ORDER — CLOPIDOGREL BISULFATE 75 MG PO TABS
75.0000 mg | ORAL_TABLET | Freq: Every day | ORAL | Status: DC
  Administered 2017-08-19: 75 mg via ORAL
  Filled 2017-08-18: qty 1

## 2017-08-18 MED ORDER — HYDROCODONE-ACETAMINOPHEN 5-325 MG PO TABS: 1.0000 | ORAL_TABLET | ORAL | Status: DC | PRN

## 2017-08-18 MED ORDER — FERROUS SULFATE 325 (65 FE) MG PO TABS
325.0000 mg | ORAL_TABLET | Freq: Every day | ORAL | Status: DC
  Administered 2017-08-19: 325 mg via ORAL
  Filled 2017-08-18: qty 1

## 2017-08-18 MED ORDER — HEPARIN SODIUM (PORCINE) 5000 UNIT/ML IJ SOLN
5000.0000 [IU] | Freq: Three times a day (TID) | INTRAMUSCULAR | Status: DC
  Administered 2017-08-18 – 2017-08-19 (×3): 5000 [IU] via SUBCUTANEOUS
  Filled 2017-08-18 (×3): qty 1

## 2017-08-18 MED ORDER — ACETAMINOPHEN 650 MG RE SUPP: 650.0000 mg | Freq: Four times a day (QID) | RECTAL | Status: DC | PRN

## 2017-08-18 MED ORDER — THIAMINE HCL 100 MG/ML IJ SOLN
100.0000 mg | Freq: Once | INTRAMUSCULAR | Status: DC
  Filled 2017-08-18: qty 2

## 2017-08-18 MED ORDER — SODIUM CHLORIDE 0.9 % IV BOLUS
1000.0000 mL | Freq: Once | INTRAVENOUS | Status: AC
  Administered 2017-08-18: 1000 mL via INTRAVENOUS

## 2017-08-18 MED ORDER — ONDANSETRON HCL 4 MG PO TABS: 4.0000 mg | ORAL_TABLET | Freq: Four times a day (QID) | ORAL | Status: DC | PRN

## 2017-08-18 MED ORDER — VITAMIN B-1 100 MG PO TABS
100.0000 mg | ORAL_TABLET | Freq: Every day | ORAL | Status: DC
  Administered 2017-08-19: 100 mg via ORAL
  Filled 2017-08-18 (×2): qty 1

## 2017-08-18 NOTE — ED Notes (Signed)
Patient up off bed again, bed alarm activated. Patient removed gauze holding IV in place and clothing. Patient placed back in bed and Fluid D/C at this time.

## 2017-08-18 NOTE — H&P (Signed)
History and Physical    Lisa Villegas ANV:[id number] DOB: 1930-12-21 DOA: 08/18/2017   PCP: [name], MD   Patient coming from:  Home    Chief Complaint: Hypotension  HPI: Lisa Villegas is a 84 y.o. 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  Blood pressure was noted to be 69/42.  History is obtained by chart, as the patient is unable to provide history due to level 5 caveat due to outpatient, and overall generalized weakness.  On transport, the patient received 400 cc bolus, waking up, and being back to her interactive baseline.  Does not appear to have any shortness of breath, chest pain, or abdominal pain, nausea or vomiting.  She can answer yes or no, denying any worsening lower extremity edema or calf pain.  She denies any other areas of pain.  Nursing home staff, reported that the patient has been having worsening failure to thrive, with very poor oral intake, which may have contributed to his disease symptoms.   ED Course:  BP (!) 147/95   Pulse 92   Temp (!) 97.3 F (36.3 C) (Rectal)   Resp (!) 27   Ht '5\' 4"'  (1.626 m)   Wt 66.7 kg (147 lb)   SpO2 100%   BMI 25.23 kg/m   Last 2D echo on 07/26/2017 shows EF 65 to 70% with grade 2 diastolic dysfunction. Recent carotid Dopplers during that hospitalization showed bilateral mild ICA stenosis 1 to 39% Chemistries remarkable for glucose 169, creatinine 1.33, anion gap was 16, sodium and potassium normal. Troponin less than 0.03 White count 14.4, hemoglobin 11.3, platelets 372 Urine negative Sinus rhythm Borderline  right axis deviation unchanged from 02/05/2016   Review of Systems:  As per HPI otherwise all other systems reviewed and are negative  Past Medical History:  Diagnosis Date  . Anemia   . Anxiety and depression    per med rec  . 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[place])   . Autism   . Chronic CHF (congestive heart failure) (HCC)    per med rec  . Chronic kidney disease (CKD)    stage 3 per chart in 09/2015  . Diabetes mellitus without complication ([place])   . Diabetic neuropathy ([place])   . History of thyroid nodule    nontoxic goiter per med rec  . Hodgkin disease ([place])    in 31  . HTN (hypertension), benign   . Hyperlipidemia   . Incontinence   . Lack of sensation   . Osteopenia    per med rec  . Poor balance   . Tubular adenoma of colon 01/2017  . Vitamin D deficiency     Past Surgical History:  Procedure Laterality Date  . ABDOMINAL HYSTERECTOMY    . BREAST LUMPECTOMY Left     Social History Social History   Socioeconomic History  . Marital status: Single    Spouse name: Not on file  . Number of children: 0  . Years of education: Not on file  . Highest education level: Not on file  Occupational History  . Not on file  Social Needs  . Financial resource strain: Not on file  .  Food insecurity:    Worry: Not on file    Inability: Not on file  . Transportation needs:    Medical: Not on file    Non-medical: Not on file  Tobacco Use  . Smoking status: Never Smoker  . Smokeless tobacco: Never Used  Substance and Sexual Activity  . Alcohol use: No  . Drug use: No  . Sexual activity: Not on file  Lifestyle  . Physical activity:    Days per week: Not on file    Minutes per session: Not on file  . Stress: Not on file  Relationships  . Social connections:    Talks on phone: Not on file    Gets together: Not on file    Attends religious service: Not on file    Active member of club or organization: Not on file    Attends meetings of clubs or organizations: Not on file     Relationship status: Not on file  . Intimate partner violence:    Fear of current or ex partner: Not on file    Emotionally abused: Not on file    Physically abused: Not on file    Forced sexual activity: Not on file  Other Topics Concern  . Not on file  Social History Narrative   Lives at [facility], single, has a PhD in [place]     Allergies  Allergen Reactions  . Penicillins Rash    On MAR: Has patient had a PCN reaction causing immediate rash, facial/tongue/throat swelling, SOB or lightheadedness with hypotension: Yes Has patient had a PCN reaction causing severe rash involving mucus membranes or skin necrosis: Unk Has patient had a PCN reaction that required hospitalization: Unk Has patient had a PCN reaction occurring within the last 10 years: Unk If all of the above answers are "NO", then may proceed with Cephalosporin use.     Family History  Problem Relation Age of Onset  . CAD Mother        died at 77 yo  . Atrial fibrillation Mother        and tachycardia  . CAD Father        died at 32 yo  . Atrial fibrillation Father        and tachycardia      Prior to Admission medications   Medication Sig Start Date End Date Taking? Authorizing Provider  aspirin 81 MG tablet Take 1 tablet (81 mg total) by mouth daily. 07/26/17   Patrecia Pour, MD  atorvastatin (LIPITOR) 20 MG tablet Take 1 tablet (20 mg total) by mouth every evening. 10/20/16   Henson, Vickie L, NP-C  calcium carbonate (TUMS EX) 750 MG chewable tablet Chew 1 tablet by mouth daily.    [provider]  Cetylpyridinium Chloride (CREST PRO-HEALTH MT) Use as directed 1 Applicatorful in the mouth or throat 2 (two) times daily.     [provider]  Cholecalciferol (VITAMIN D3) 2000 UNITS TABS Take 1 tablet by mouth daily.     [provider]  clopidogrel (PLAVIX) 75 MG tablet Take 1 tablet (75 mg total) by mouth daily. 07/26/17   Patrecia Pour, MD  Fe Fum-FePoly-FA-Vit C-Vit  B3 (INTEGRA F) 125-1 MG CAPS Take 1 capsule by mouth daily.    [provider]  ferrous sulfate 325 (65 FE) MG tablet Take 325 mg by mouth daily.     [provider]  fesoterodine (TOVIAZ) 8 MG TB24 tablet TAKE 1 TAB BY  MOUTH EVERY DAY.  DO NOT CRUSH OR CHEW . 10/25/16   Denita Lung, MD  hydrALAZINE (APRESOLINE) 10 MG tablet Take 1 tablet (10 mg total) by mouth 3 (three) times daily. 08/03/17   Rai, Ripudeep K, MD  magnesium chloride (SLOW-MAG) 64 MG TBEC SR tablet Take 1 tablet by mouth 2 (two) times daily.     [provider]  metFORMIN (GLUCOPHAGE) 500 MG tablet Take 1,000 mg by mouth 2 (two) times daily.     [provider]  Multiple Vitamins-Minerals (CERTA-VITE PO) Take 1 tablet by mouth daily.     [provider]  Nutritional Supplements (GLUCERNA PO) Take 1 Can by mouth 2 (two) times daily.     [provider]  omeprazole (PRILOSEC) 40 MG capsule Take 1 capsule (40 mg total) by mouth daily. 08/03/17   Rai, Ripudeep Raliegh Ip, MD  Sodium Fluoride (PREVIDENT 5000 BOOSTER PLUS DT) Place 1 application onto teeth every evening.     [provider]  verapamil (CALAN-SR) 120 MG CR tablet Take 120 mg by mouth at bedtime.    [provider]    Physical Exam:  Vitals:   08/18/17 1219 08/18/17 1313 08/18/17 1350 08/18/17 1400  BP:   (!) 142/117 (!) 147/95  Pulse:  93 96 92  Resp:  (!) 27 17 (!) 27  Temp:      TempSrc:      SpO2:  100% 100% 100%  Weight: 66.7 kg (147 lb)     Height: '5\' 4"'  (1.626 m)      Constitutional: NAD, calm, chronically ill appearing Eyes: PERRL, lids and conjunctivae normal ENMT: Mucous membranes are dry, without exudate or lesions  Neck: normal, supple, no masses, no thyromegaly Respiratory: clear to auscultation bilaterally, no wheezing, no crackles. Normal respiratory effort  Cardiovascular: Regular rate and rhythm,  murmur, rubs or gallops. No extremity edema. 2+ pedal pulses. No carotid bruits.    Abdomen: Soft, non tender, No hepatosplenomegaly. Bowel sounds positive.  Musculoskeletal: no clubbing / cyanosis.  Bedbound, chronic joint deformity of the lower extremities Skin: no jaundice, No lesions.  Neurologic: Sensation intact  Strength equal in all extremities.  Chronic tremor of the right hand Psychiatric:   Awake, oriented to name, normal mood at this time.    Labs on Admission: I have personally reviewed following labs and imaging studies  CBC: Recent Labs  Lab 08/18/17 1254  WBC 14.4*  NEUTROABS 12.5*  HGB 11.3*  HCT 38.5  MCV 83.0  PLT 923    Basic Metabolic Panel: Recent Labs  Lab 08/18/17 1254  NA 138  K 4.7  CL 104  CO2 18*  GLUCOSE 169*  BUN 25*  CREATININE 1.33*  CALCIUM 9.4    GFR: Estimated Creatinine Clearance: 29.6 mL/min (A) (by C-G formula based on SCr of 1.33 mg/dL (H)).  Liver Function Tests: Recent Labs  Lab 08/18/17 1254  AST 22  ALT 19  ALKPHOS 124  BILITOT 0.4  PROT 6.7  ALBUMIN 3.4*   No results for input(s): LIPASE, AMYLASE in the last 168 hours. No results for input(s): AMMONIA in the last 168 hours.  Coagulation Profile: No results for input(s): INR, PROTIME in the last 168 hours.  Cardiac Enzymes: Recent Labs  Lab 08/18/17 1254  TROPONINI <0.03    BNP (last 3 results) No results for input(s): PROBNP in the last 8760 hours.  HbA1C: No results for input(s): HGBA1C in the last 72 hours.  CBG: No results for input(s):  GLUCAP in the last 168 hours.  Lipid Profile: No results for input(s): CHOL, HDL, LDLCALC, TRIG, CHOLHDL, LDLDIRECT in the last 72 hours.  Thyroid Function Tests: No results for input(s): TSH, T4TOTAL, FREET4, T3FREE, THYROIDAB in the last 72 hours.  Anemia Panel: No results for input(s): VITAMINB12, FOLATE, FERRITIN, TIBC, IRON, RETICCTPCT in the last 72 hours.  Urine analysis:    Component Value Date/Time   COLORURINE YELLOW 08/18/2017 1349   APPEARANCEUR HAZY (A) 08/18/2017 1349    LABSPEC 1.015 08/18/2017 1349   PHURINE 5.0 08/18/2017 1349   GLUCOSEU NEGATIVE 08/18/2017 1349   HGBUR NEGATIVE 08/18/2017 1349   BILIRUBINUR NEGATIVE 08/18/2017 1349   BILIRUBINUR n 03/14/2016 1227   KETONESUR NEGATIVE 08/18/2017 1349   PROTEINUR 30 (A) 08/18/2017 1349   UROBILINOGEN negative 03/14/2016 1227   NITRITE NEGATIVE 08/18/2017 1349   LEUKOCYTESUR NEGATIVE 08/18/2017 1349    Sepsis Labs: '@LABRCNTIP' (procalcitonin:4,lacticidven:4) )No results found for this or any previous visit (from the past 240 hour(s)).   Radiological Exams on Admission: No results found.  EKG: Independently reviewed. Sinus rhythm Borderline right axis deviation unchanged from 02/05/2016 Assessment/Plan Active Problems:   FTT (failure to thrive) in adult   Hypotension   Type II diabetes mellitus with renal manifestations ([place])   Hypertension   Incontinence of feces   Urinary incontinence without sensory awareness   Hyperlipidemia   Anxiety and depression   Autism   Hodgkin disease ([place])   HTN (hypertension), benign   CKD (chronic kidney disease), stage III (HCC)   Anemia   Diabetes mellitus, controlled ([place])   Hypotension: Unclear cause, but very poor oral intake is suspected, .  Septic shock is doubted, as no source of infection or fever is noted.   Tamponade and massive PE doubted given BP improvement with minimal IV fluid administration.  EKG shows sinus rhythm Borderline right axis deviation unchanged from 02/05/2016 Last 2D echo on 07/26/2017 shows EF 65 to 70% with grade 2 diastolic dysfunction.Recent carotid Dopplers during that hospitalization showed bilateral mild ICA stenosis 1 to 39%. Troponin less than 0.03 White count 14.4 Urine negative. Admit to telemetry observation Echocardiogram Revealed to resume her antihypertensives in the morning, which may include verapamil, and hydralazine, which has been placed on hold and she will continue to be monitored. IVF NS  Consult to Critical  Care if blood pressure worsens, and requires pressors Will obtain palliative care consultation, for evaluation of goals of care.  Patient is DNR.  Hypertension Continue home anti-hypertensive medications in a.m. when BP normalizes Add Hydralazine Q6 hours as needed for BP 160/90     Recent TIA, residual right-sided weakness, no acute changes. Continue aspirin and Plavix Come follow-up patient neurology  Chronic diastolic heart failure, class II, with last 2D echo showing EF 65 to 96%, grade 2 diastolic, currently compensated.  Weight is 147 pounds Monitor closely INO's, weigh daily.  Diabetes mellitus, controlled.  Glucose 169 Continue sliding scale insulin Hold oral agents  Acute on chronic CKD, stage III, GFR between 30 and 59, creatinine is slightly more elevated due to dehydration due to decreased p.o. intake.  Current creatinine is 1.33, baseline 0.7 Continue IV fluids Avoid nephrotoxins Repeat be met in a.m.  Autism, cognitive impairment, chronic tremor of the right hand Currently appears to be at baseline  History of Hodgkin's disease, not on any therapy at this time  DVT prophylaxis: Heparin subcu Code Status:    DNR Family Communication:   no family at bedside Disposition Plan: Expect  patient to be discharged to nursing home after condition improves Consults called:    Palliative care evaluation Admission status: Observation telemetry   Sharene Butters, PA-C [facility]   Amion text  [phone]   08/18/2017, 5:40 PM

## 2017-08-18 NOTE — ED Notes (Signed)
Called [facility] at [facility] to speak with care staff per Dr. Lenna Sciara.

## 2017-08-18 NOTE — ED Triage Notes (Signed)
Patient found unresponsive at nursing facility this am with initial B/P 90/40. CBG 230, per EMS patient responded after 400NS given.

## 2017-08-18 NOTE — ED Notes (Signed)
Patient had a large bowel movement, pasty in texture and dark green/gray in color. Patient cleaned.

## 2017-08-18 NOTE — ED Provider Notes (Signed)
[facility] EMERGENCY DEPARTMENT Provider Note   CSN: [id number] Arrival date & time: 08/18/17  1213     History   Chief Complaint Chief Complaint  Patient presents with  . Altered Mental Status   History is obtained from paramedics, from Dortha Kern, nurse at assisted living facility by telephoneand from Ms.[name], patient's healthcare power of attorney by telephone. HPI Lisa Villegas is a 84 y.o. female.  HPI Patient was found in her wheelchair. She was unresponsive this morning prostate 11 AM blood pressure noted to be 69/42. EMS treated patient with saline 400 mL bolus. Patient is presently asymptomatic. She denies pain anywhere denies shortness of breath. No other associated symptoms. Ms.[name] gives the history the patient drinks very little.. Past Medical History:  Diagnosis Date  . Anemia   . Anxiety and depression    per med rec  . 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 Autism   . Chronic CHF (congestive heart failure) (HCC)    per med rec  . Chronic kidney disease (CKD)    stage 3 per chart in 09/2015  . Diabetes mellitus without complication ([place])   . Diabetic neuropathy ([place])   . History of thyroid nodule    nontoxic goiter per med rec  . Hodgkin disease ([place])    in 98  . HTN (hypertension), benign   . Hyperlipidemia   . Incontinence   . Lack of sensation   . Osteopenia    per med rec  . Poor balance   . Tubular adenoma of colon 01/2017  . Vitamin D deficiency     Patient Active Problem List   Diagnosis Date Noted  . Syncope 07/31/2017  . Chronic diastolic heart failure, NYHA class 2 ([place]) 07/31/2017  . Uncontrolled hypertension 07/31/2017  . Diabetes mellitus, controlled ([place]) 07/31/2017  . CKD (chronic kidney disease) stage 3, GFR 30-59 ml/min (HCC) 07/31/2017  . FTT (failure to thrive) in adult 07/31/2017  . History of Hodgkin's disease 07/31/2017  . Tremor of right hand/chronic &benign 07/31/2017  . Hypertensive urgency  07/26/2017  . Acute metabolic encephalopathy 53/97/6734  . GERD (gastroesophageal reflux disease) 07/25/2017  . Hyperkalemia 07/25/2017  . Anemia 11/30/2016  . Anxiety and depression   . Autism   . History of thyroid nodule   . Hodgkin disease ([place])   . HTN (hypertension), benign   . Incontinence   . Lack of sensation   . Poor balance   . CKD (chronic kidney disease), stage III ([place])   . Osteopenia   . Vitamin D deficiency   . Follow-up examination for injury 02/08/2016  . Type II diabetes mellitus with renal manifestations ([place]) 01/01/2016  . Hypertension 01/01/2016  . Incontinence of feces 01/01/2016  . Urinary incontinence without sensory awareness 01/01/2016  . Hyperlipidemia 01/01/2016    Past Surgical History:  Procedure Laterality Date  . ABDOMINAL HYSTERECTOMY    . BREAST LUMPECTOMY Left      OB History   None      Home Medications    Prior to Admission medications   Medication Sig Start Date End Date Taking? Authorizing Provider  aspirin 81 MG tablet Take 1 tablet (81 mg total) by mouth daily. 07/26/17   Patrecia Pour, MD  atorvastatin (LIPITOR) 20 MG tablet Take 1 tablet (20 mg total) by mouth every evening. 10/20/16   Henson, Vickie L, NP-C  calcium carbonate (TUMS EX) 750 MG chewable tablet Chew 1 tablet by mouth daily.    [provider]  Cetylpyridinium Chloride (CREST PRO-HEALTH MT) Use as directed 1 Applicatorful in the mouth or throat 2 (two) times daily.     [provider]  Cholecalciferol (VITAMIN D3) 2000 UNITS TABS Take 1 tablet by mouth daily.     [provider]  clopidogrel (PLAVIX) 75 MG tablet Take 1 tablet (75 mg total) by mouth daily. 07/26/17   Patrecia Pour, MD  Fe Fum-FePoly-FA-Vit C-Vit B3 (INTEGRA F) 125-1 MG CAPS Take 1 capsule by mouth daily.    [provider]  ferrous sulfate 325 (65 FE) MG tablet Take 325 mg by mouth daily.     [provider]  fesoterodine (TOVIAZ) 8 MG TB24 tablet TAKE 1  TAB BY MOUTH EVERY DAY.  DO NOT CRUSH OR CHEW . 10/25/16   Denita Lung, MD  hydrALAZINE (APRESOLINE) 10 MG tablet Take 1 tablet (10 mg total) by mouth 3 (three) times daily. 08/03/17   Rai, Ripudeep K, MD  magnesium chloride (SLOW-MAG) 64 MG TBEC SR tablet Take 1 tablet by mouth 2 (two) times daily.     [provider]  metFORMIN (GLUCOPHAGE) 500 MG tablet Take 1,000 mg by mouth 2 (two) times daily.     [provider]  Multiple Vitamins-Minerals (CERTA-VITE PO) Take 1 tablet by mouth daily.     [provider]  Nutritional Supplements (GLUCERNA PO) Take 1 Can by mouth 2 (two) times daily.     [provider]  omeprazole (PRILOSEC) 40 MG capsule Take 1 capsule (40 mg total) by mouth daily. 08/03/17   Rai, Ripudeep Raliegh Ip, MD  Sodium Fluoride (PREVIDENT 5000 BOOSTER PLUS DT) Place 1 application onto teeth every evening.     [provider]  verapamil (CALAN-SR) 120 MG CR tablet Take 120 mg by mouth at bedtime.    [provider]    Family History Family History  Problem Relation Age of Onset  . CAD Mother        died at 86 yo  . Atrial fibrillation Mother        and tachycardia  . CAD Father        died at 69 yo  . Atrial fibrillation Father        and tachycardia    Social History Social History   Tobacco Use  . Smoking status: Never Smoker  . Smokeless tobacco: Never Used  Substance Use Topics  . Alcohol use: No  . Drug use: No   Lives in assisted living facility DO NOT RESUSCITATE CODE STATUS  Allergies   Penicillins   Review of Systems Review of Systems  Unable to perform ROS: Mental status change  Musculoskeletal: Positive for gait problem.       Wheelchair-bound     Physical Exam Updated Vital Signs BP 126/71 (BP Location: Right Arm)   Pulse 84   Temp (!) 97.3 F (36.3 C) (Rectal)   Resp (!) 26   Ht 5\' 4"  (1.626 m)   Wt 66.7 kg (147 lb)   SpO2 100%   BMI 25.23 kg/m   Physical Exam  Constitutional:    Frail-appearing  HENT:  Head: Normocephalic and atraumatic.  Mucous membranes dry,  Eyes: Pupils are equal, round, and reactive to light. Conjunctivae are normal.  Conjunctiva pale  Neck: Neck supple. No tracheal deviation present. No thyromegaly present.  Cardiovascular: Normal rate and regular rhythm.  No murmur heard. Pulmonary/Chest: Effort normal and breath sounds normal.  Abdominal: Soft. Bowel sounds are  normal. She exhibits no distension. There is no tenderness.  Genitourinary:  Genitourinary Comments: Rectum: Normal tone no gross blood and Hemoccult negative  Musculoskeletal: Normal range of motion. She exhibits no edema or tenderness.  Neurological: She is alert. Coordination normal.  Follow simple commands moves all extremities  Skin: Skin is warm and dry. Capillary refill takes less than 2 seconds. No rash noted.  Psychiatric: She has a normal mood and affect.  Nursing note and vitals reviewed.    ED Treatments / Results  Labs (all labs ordered are listed, but only abnormal results are displayed) Labs Reviewed - No data to display  EKG  EKG Interpretation  Date/Time:  Friday Aug 18 2017 12:27:43 EDT Ventricular Rate:  81 PR Interval:    QRS Duration: 95 QT Interval:  397 QTC Calculation: 461 R Axis:   81 Text Interpretation:  Sinus rhythm Borderline right axis deviation unchanged from 02/05/2016 Confirmed by Orlie Dakin [phone]) on 08/18/2017 12:34:22 PM     Unchanged from 02/05/2016  Radiology No results found.  Procedures Procedures (including critical care time)  Medications Ordered in ED Medications - No data to display  Results for orders placed or performed during the hospital encounter of 08/18/17  Comprehensive metabolic panel  Result Value Ref Range   Sodium 138 135 - 145 mmol/L   Potassium 4.7 3.5 - 5.1 mmol/L   Chloride 104 101 - 111 mmol/L   CO2 18 (L) 22 - 32 mmol/L   Glucose, Bld 169 (H) 65 - 99 mg/dL   BUN 25 (H) 6 - 20 mg/dL    Creatinine, Ser 1.33 (H) 0.44 - 1.00 mg/dL   Calcium 9.4 8.9 - 10.3 mg/dL   Total Protein 6.7 6.5 - 8.1 g/dL   Albumin 3.4 (L) 3.5 - 5.0 g/dL   AST 22 15 - 41 U/L   ALT 19 14 - 54 U/L   Alkaline Phosphatase 124 38 - 126 U/L   Total Bilirubin 0.4 0.3 - 1.2 mg/dL   GFR calc non Af Amer 36 (L) >60 mL/min   GFR calc Af Amer 41 (L) >60 mL/min   Anion gap 16 (H) 5 - 15  CBC with Differential/Platelet  Result Value Ref Range   WBC 14.4 (H) 4.0 - 10.5 K/uL   RBC 4.64 3.87 - 5.11 MIL/uL   Hemoglobin 11.3 (L) 12.0 - 15.0 g/dL   HCT 38.5 36.0 - 46.0 %   MCV 83.0 78.0 - 100.0 fL   MCH 24.4 (L) 26.0 - 34.0 pg   MCHC 29.4 (L) 30.0 - 36.0 g/dL   RDW 17.0 (H) 11.5 - 15.5 %   Platelets 372 150 - 400 K/uL   Neutrophils Relative % 88 %   Neutro Abs 12.5 (H) 1.7 - 7.7 K/uL   Lymphocytes Relative 7 %   Lymphs Abs 1.0 0.7 - 4.0 K/uL   Monocytes Relative 4 %   Monocytes Absolute 0.6 0.1 - 1.0 K/uL   Eosinophils Relative 0 %   Eosinophils Absolute 0.0 0.0 - 0.7 K/uL   Basophils Relative 0 %   Basophils Absolute 0.1 0.0 - 0.1 K/uL   Immature Granulocytes 1 %   Abs Immature Granulocytes 0.2 (H) 0.0 - 0.1 K/uL  Troponin I  Result Value Ref Range   Troponin I <0.03 <0.03 ng/mL  Urinalysis, Routine w reflex microscopic  Result Value Ref Range   Color, Urine YELLOW YELLOW   APPearance HAZY (A) CLEAR   Specific Gravity, Urine 1.015 1.005 - 1.030  pH 5.0 5.0 - 8.0   Glucose, UA NEGATIVE NEGATIVE mg/dL   Hgb urine dipstick NEGATIVE NEGATIVE   Bilirubin Urine NEGATIVE NEGATIVE   Ketones, ur NEGATIVE NEGATIVE mg/dL   Protein, ur 30 (A) NEGATIVE mg/dL   Nitrite NEGATIVE NEGATIVE   Leukocytes, UA NEGATIVE NEGATIVE   RBC / HPF 0-5 0 - 5 RBC/hpf   WBC, UA 0-5 0 - 5 WBC/hpf   Bacteria, UA RARE (A) NONE SEEN   Squamous Epithelial / LPF 0-5 0 - 5  POC occult blood, ED Provider will collect  Result Value Ref Range   Fecal Occult Bld NEGATIVE NEGATIVE   Dg Chest 2 View  Result Date:  07/31/2017 CLINICAL DATA:  Loss of consciousness this morning, history CHF, diabetes mellitus, hypertension, stage III chronic kidney disease EXAM: CHEST - 2 VIEW COMPARISON:  Portable exam of 07/26/2017 FINDINGS: Rotated to the LEFT. Normal heart size, mediastinal contours, and pulmonary vascularity. Atherosclerotic calcifications aorta. Bibasilar atelectasis. Lungs otherwise clear. No pleural effusion or pneumothorax. Bones demineralized. IMPRESSION: Bibasilar atelectasis. Electronically Signed   By: Lavonia Dana M.D.   On: 07/31/2017 10:34   Ct Head Wo Contrast  Result Date: 07/31/2017 CLINICAL DATA:  Seizure, nontraumatic. EXAM: CT HEAD WITHOUT CONTRAST TECHNIQUE: Contiguous axial images were obtained from the base of the skull through the vertex without intravenous contrast. COMPARISON:  07/25/2017 head CT. FINDINGS: Brain: Stable tiny left basal ganglia lacune. No evidence of parenchymal hemorrhage or extra-axial fluid collection. No mass lesion, mass effect, or midline shift. No CT evidence of acute infarction. Generalized cerebral volume loss. Nonspecific mild subcortical and periventricular white matter hypodensity, most in keeping with chronic small vessel ischemic change. No ventriculomegaly. Vascular: No acute abnormality. Skull: No evidence of calvarial fracture. Sinuses/Orbits: Minimal partial opacification of the left sphenoid sinus. No fluid levels. Other:  The mastoid air cells are unopacified. IMPRESSION: 1.  No evidence of acute intracranial abnormality. 2. Generalized cerebral volume loss, stable tiny left basal ganglia lacune and mild chronic small vessel ischemic changes in the cerebral white matter. Electronically Signed   By: Ilona Sorrel M.D.   On: 07/31/2017 10:28   Ct Head Wo Contrast  Result Date: 07/25/2017 CLINICAL DATA:  Unable wall. EXAM: CT HEAD WITHOUT CONTRAST TECHNIQUE: Contiguous axial images were obtained from the base of the skull through the vertex without intravenous  contrast. COMPARISON:  CT scan of February 05, 2016. FINDINGS: Brain: Mild diffuse cortical atrophy is noted. Mild chronic ischemic white matter disease is noted. No mass effect or midline shift is noted. Ventricular size is within normal limits. There is no evidence of mass lesion, hemorrhage or acute infarction. Vascular: No hyperdense vessel or unexpected calcification. Skull: Normal. Negative for fracture or focal lesion. Sinuses/Orbits: Left sphenoid sinusitis. Other: None. IMPRESSION: Mild diffuse cortical atrophy. Mild chronic ischemic white matter disease. No acute intracranial abnormality seen. Electronically Signed   By: Marijo Conception, M.D.   On: 07/25/2017 19:35   Mr Brain Wo Contrast  Result Date: 07/26/2017 CLINICAL DATA:  Altered mental status EXAM: MRI HEAD WITHOUT CONTRAST TECHNIQUE: Multiplanar, multiecho pulse sequences of the brain and surrounding structures were obtained without intravenous contrast. COMPARISON:  Head CT 07/25/2017 FINDINGS: BRAIN: The midline structures are normal. There is no acute infarct or acute hemorrhage. No mass lesion, hydrocephalus, dural abnormality or extra-axial collection. Multifocal white matter hyperintensity, most commonly due to chronic ischemic microangiopathy. No age-advanced or lobar predominant atrophy. There is ar focus of chronic microhemorrhage in the left occipital  lobe with associated low T1/T2-weighted signal structure measuring 3 mm in diameter. VASCULAR: Major intracranial arterial and venous sinus flow voids are preserved. SKULL AND UPPER CERVICAL SPINE: The visualized skull base, calvarium, upper cervical spine and extracranial soft tissues are normal. SINUSES/ORBITS: No fluid levels or advanced mucosal thickening. No mastoid or middle ear effusion. Normal orbits. IMPRESSION: 1. No acute intracranial abnormality. 2. 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 Electronically Signed   By: Ulyses Jarred M.D.    On: 07/26/2017 00:44   Dg Chest Port 1 View  Result Date: 07/26/2017 CLINICAL DATA:  Altered mental status EXAM: PORTABLE CHEST 1 VIEW COMPARISON:  None. FINDINGS: Heart size and pulmonary vascularity are normal. Emphysematous changes in the lungs. Central interstitial pattern with bronchial thickening suggesting chronic bronchitis. Linear atelectasis or fibrosis in the right lung base. No focal consolidation. No blunting of costophrenic angles. No pneumothorax. Old right rib fractures. Calcification of the aorta. IMPRESSION: Emphysematous and chronic bronchitic changes in the lungs. Linear atelectasis or fibrosis in the right lung base. No focal consolidation. Aortic atherosclerosis. Electronically Signed   By: Lucienne Capers M.D.   On: 07/26/2017 02:40   Mr Jodene Nam Head Wo Contrast  Result Date: 07/26/2017 CLINICAL DATA:  TIA.  Confusion EXAM: MRA HEAD WITHOUT CONTRAST TECHNIQUE: Angiographic images of the Circle of Willis were obtained using MRA technique without intravenous contrast. COMPARISON:  MRI head 07/26/2017 FINDINGS: Both vertebral arteries patent to the basilar. Basilar widely patent with mild atherosclerotic irregularity. PICA not visualized possibly due to patient positioning. AICA, superior cerebellar, and posterior cerebral arteries patent proximally. Mild stenosis distal right PCA and high-grade stenosis versus occlusion of the distal left PCA. Atherosclerotic irregularity and moderate stenosis in the cavernous carotid bilaterally. Anterior and middle cerebral arteries patent bilaterally. Atherosclerotic irregularity distal MCA branches bilaterally. Negative for cerebral aneurysm IMPRESSION: Intracranial atherosclerotic disease involving the posterior cerebral arteries and middle cerebral arteries bilaterally. Severe stenosis versus occlusion distal left PCA. Electronically Signed   By: Franchot Gallo M.D.   On: 07/26/2017 11:35   Results for orders placed or performed during the hospital  encounter of 08/18/17  Comprehensive metabolic panel  Result Value Ref Range   Sodium 138 135 - 145 mmol/L   Potassium 4.7 3.5 - 5.1 mmol/L   Chloride 104 101 - 111 mmol/L   CO2 18 (L) 22 - 32 mmol/L   Glucose, Bld 169 (H) 65 - 99 mg/dL   BUN 25 (H) 6 - 20 mg/dL   Creatinine, Ser 1.33 (H) 0.44 - 1.00 mg/dL   Calcium 9.4 8.9 - 10.3 mg/dL   Total Protein 6.7 6.5 - 8.1 g/dL   Albumin 3.4 (L) 3.5 - 5.0 g/dL   AST 22 15 - 41 U/L   ALT 19 14 - 54 U/L   Alkaline Phosphatase 124 38 - 126 U/L   Total Bilirubin 0.4 0.3 - 1.2 mg/dL   GFR calc non Af Amer 36 (L) >60 mL/min   GFR calc Af Amer 41 (L) >60 mL/min   Anion gap 16 (H) 5 - 15  CBC with Differential/Platelet  Result Value Ref Range   WBC 14.4 (H) 4.0 - 10.5 K/uL   RBC 4.64 3.87 - 5.11 MIL/uL   Hemoglobin 11.3 (L) 12.0 - 15.0 g/dL   HCT 38.5 36.0 - 46.0 %   MCV 83.0 78.0 - 100.0 fL   MCH 24.4 (L) 26.0 - 34.0 pg   MCHC 29.4 (L) 30.0 - 36.0 g/dL  RDW 17.0 (H) 11.5 - 15.5 %   Platelets 372 150 - 400 K/uL   Neutrophils Relative % 88 %   Neutro Abs 12.5 (H) 1.7 - 7.7 K/uL   Lymphocytes Relative 7 %   Lymphs Abs 1.0 0.7 - 4.0 K/uL   Monocytes Relative 4 %   Monocytes Absolute 0.6 0.1 - 1.0 K/uL   Eosinophils Relative 0 %   Eosinophils Absolute 0.0 0.0 - 0.7 K/uL   Basophils Relative 0 %   Basophils Absolute 0.1 0.0 - 0.1 K/uL   Immature Granulocytes 1 %   Abs Immature Granulocytes 0.2 (H) 0.0 - 0.1 K/uL  Troponin I  Result Value Ref Range   Troponin I <0.03 <0.03 ng/mL  Urinalysis, Routine w reflex microscopic  Result Value Ref Range   Color, Urine YELLOW YELLOW   APPearance HAZY (A) CLEAR   Specific Gravity, Urine 1.015 1.005 - 1.030   pH 5.0 5.0 - 8.0   Glucose, UA NEGATIVE NEGATIVE mg/dL   Hgb urine dipstick NEGATIVE NEGATIVE   Bilirubin Urine NEGATIVE NEGATIVE   Ketones, ur NEGATIVE NEGATIVE mg/dL   Protein, ur 30 (A) NEGATIVE mg/dL   Nitrite NEGATIVE NEGATIVE   Leukocytes, UA NEGATIVE NEGATIVE   RBC / HPF 0-5 0 -  5 RBC/hpf   WBC, UA 0-5 0 - 5 WBC/hpf   Bacteria, UA RARE (A) NONE SEEN   Squamous Epithelial / LPF 0-5 0 - 5  POC occult blood, ED Provider will collect  Result Value Ref Range   Fecal Occult Bld NEGATIVE NEGATIVE   Dg Chest 2 View  Result Date: 07/31/2017 CLINICAL DATA:  Loss of consciousness this morning, history CHF, diabetes mellitus, hypertension, stage III chronic kidney disease EXAM: CHEST - 2 VIEW COMPARISON:  Portable exam of 07/26/2017 FINDINGS: Rotated to the LEFT. Normal heart size, mediastinal contours, and pulmonary vascularity. Atherosclerotic calcifications aorta. Bibasilar atelectasis. Lungs otherwise clear. No pleural effusion or pneumothorax. Bones demineralized. IMPRESSION: Bibasilar atelectasis. Electronically Signed   By: Lavonia Dana M.D.   On: 07/31/2017 10:34   Ct Head Wo Contrast  Result Date: 07/31/2017 CLINICAL DATA:  Seizure, nontraumatic. EXAM: CT HEAD WITHOUT CONTRAST TECHNIQUE: Contiguous axial images were obtained from the base of the skull through the vertex without intravenous contrast. COMPARISON:  07/25/2017 head CT. FINDINGS: Brain: Stable tiny left basal ganglia lacune. No evidence of parenchymal hemorrhage or extra-axial fluid collection. No mass lesion, mass effect, or midline shift. No CT evidence of acute infarction. Generalized cerebral volume loss. Nonspecific mild subcortical and periventricular white matter hypodensity, most in keeping with chronic small vessel ischemic change. No ventriculomegaly. Vascular: No acute abnormality. Skull: No evidence of calvarial fracture. Sinuses/Orbits: Minimal partial opacification of the left sphenoid sinus. No fluid levels. Other:  The mastoid air cells are unopacified. IMPRESSION: 1.  No evidence of acute intracranial abnormality. 2. Generalized cerebral volume loss, stable tiny left basal ganglia lacune and mild chronic small vessel ischemic changes in the cerebral white matter. Electronically Signed   By: Ilona Sorrel  M.D.   On: 07/31/2017 10:28   Ct Head Wo Contrast  Result Date: 07/25/2017 CLINICAL DATA:  Unable wall. EXAM: CT HEAD WITHOUT CONTRAST TECHNIQUE: Contiguous axial images were obtained from the base of the skull through the vertex without intravenous contrast. COMPARISON:  CT scan of February 05, 2016. FINDINGS: Brain: Mild diffuse cortical atrophy is noted. Mild chronic ischemic white matter disease is noted. No mass effect or midline shift is noted. Ventricular size is within normal  limits. There is no evidence of mass lesion, hemorrhage or acute infarction. Vascular: No hyperdense vessel or unexpected calcification. Skull: Normal. Negative for fracture or focal lesion. Sinuses/Orbits: Left sphenoid sinusitis. Other: None. IMPRESSION: Mild diffuse cortical atrophy. Mild chronic ischemic white matter disease. No acute intracranial abnormality seen. Electronically Signed   By: Marijo Conception, M.D.   On: 07/25/2017 19:35   Mr Brain Wo Contrast  Result Date: 07/26/2017 CLINICAL DATA:  Altered mental status EXAM: MRI HEAD WITHOUT CONTRAST TECHNIQUE: Multiplanar, multiecho pulse sequences of the brain and surrounding structures were obtained without intravenous contrast. COMPARISON:  Head CT 07/25/2017 FINDINGS: BRAIN: The midline structures are normal. There is no acute infarct or acute hemorrhage. No mass lesion, hydrocephalus, dural abnormality or extra-axial collection. Multifocal white matter hyperintensity, most commonly due to chronic ischemic microangiopathy. No age-advanced or lobar predominant atrophy. There is ar focus of chronic microhemorrhage in the left occipital lobe with associated low T1/T2-weighted signal structure measuring 3 mm in diameter. VASCULAR: Major intracranial arterial and venous sinus flow voids are preserved. SKULL AND UPPER CERVICAL SPINE: The visualized skull base, calvarium, upper cervical spine and extracranial soft tissues are normal. SINUSES/ORBITS: No fluid levels or  advanced mucosal thickening. No mastoid or middle ear effusion. Normal orbits. IMPRESSION: 1. No acute intracranial abnormality. 2. 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 Electronically Signed   By: Ulyses Jarred M.D.   On: 07/26/2017 00:44   Dg Chest Port 1 View  Result Date: 07/26/2017 CLINICAL DATA:  Altered mental status EXAM: PORTABLE CHEST 1 VIEW COMPARISON:  None. FINDINGS: Heart size and pulmonary vascularity are normal. Emphysematous changes in the lungs. Central interstitial pattern with bronchial thickening suggesting chronic bronchitis. Linear atelectasis or fibrosis in the right lung base. No focal consolidation. No blunting of costophrenic angles. No pneumothorax. Old right rib fractures. Calcification of the aorta. IMPRESSION: Emphysematous and chronic bronchitic changes in the lungs. Linear atelectasis or fibrosis in the right lung base. No focal consolidation. Aortic atherosclerosis. Electronically Signed   By: Lucienne Capers M.D.   On: 07/26/2017 02:40   Mr Jodene Nam Head Wo Contrast  Result Date: 07/26/2017 CLINICAL DATA:  TIA.  Confusion EXAM: MRA HEAD WITHOUT CONTRAST TECHNIQUE: Angiographic images of the Circle of Willis were obtained using MRA technique without intravenous contrast. COMPARISON:  MRI head 07/26/2017 FINDINGS: Both vertebral arteries patent to the basilar. Basilar widely patent with mild atherosclerotic irregularity. PICA not visualized possibly due to patient positioning. AICA, superior cerebellar, and posterior cerebral arteries patent proximally. Mild stenosis distal right PCA and high-grade stenosis versus occlusion of the distal left PCA. Atherosclerotic irregularity and moderate stenosis in the cavernous carotid bilaterally. Anterior and middle cerebral arteries patent bilaterally. Atherosclerotic irregularity distal MCA branches bilaterally. Negative for cerebral aneurysm IMPRESSION: Intracranial atherosclerotic disease  involving the posterior cerebral arteries and middle cerebral arteries bilaterally. Severe stenosis versus occlusion distal left PCA. Electronically Signed   By: Franchot Gallo M.D.   On: 07/26/2017 11:35   Initial Impression / Assessment and Plan / ED Course  I have reviewed the triage vital signs and the nursing notes.  Pertinent labs & imaging results that were available during my care of the patient were reviewed by me and considered in my medical decision making (see chart for details).     Lab work consistent with acute kidney injury. History of not eating and drinking and worsening renal function consistent withdehydration. Plan IV hydration with normal saline ordered. Palliative care consult  ordered. I consulted hospitalist service who will arrange for overnight stay . Final Clinical Impressions(s) / ED Diagnoses  Diagnosis acute kidney injury Final diagnoses:  None    ED Discharge Orders    None       Orlie Dakin, MD 08/18/17 1622

## 2017-08-18 NOTE — ED Notes (Signed)
Patient found out of bed with no leads on her. Patient redirected back into bed and [place] bed alarm placed in bed.

## 2017-08-19 DIAGNOSIS — R627 Adult failure to thrive: Secondary | ICD-10-CM | POA: Diagnosis not present

## 2017-08-19 DIAGNOSIS — E0822 Diabetes mellitus due to underlying condition with diabetic chronic kidney disease: Secondary | ICD-10-CM | POA: Diagnosis not present

## 2017-08-19 DIAGNOSIS — E860 Dehydration: Secondary | ICD-10-CM

## 2017-08-19 DIAGNOSIS — D649 Anemia, unspecified: Secondary | ICD-10-CM | POA: Diagnosis not present

## 2017-08-19 DIAGNOSIS — M2550 Pain in unspecified joint: Secondary | ICD-10-CM | POA: Diagnosis not present

## 2017-08-19 DIAGNOSIS — F840 Autistic disorder: Secondary | ICD-10-CM | POA: Diagnosis not present

## 2017-08-19 DIAGNOSIS — Z7401 Bed confinement status: Secondary | ICD-10-CM | POA: Diagnosis not present

## 2017-08-19 DIAGNOSIS — N179 Acute kidney failure, unspecified: Secondary | ICD-10-CM | POA: Diagnosis not present

## 2017-08-19 DIAGNOSIS — I10 Essential (primary) hypertension: Secondary | ICD-10-CM | POA: Diagnosis not present

## 2017-08-19 LAB — BASIC METABOLIC PANEL
ANION GAP: 8 (ref 5–15)
BUN: 30 mg/dL — AB (ref 6–20)
CALCIUM: 9.3 mg/dL (ref 8.9–10.3)
CO2: 25 mmol/L (ref 22–32)
CREATININE: 1.14 mg/dL — AB (ref 0.44–1.00)
Chloride: 108 mmol/L (ref 101–111)
GFR calc non Af Amer: 43 mL/min — ABNORMAL LOW (ref >60)
GFR, EST AFRICAN AMERICAN: 50 mL/min — AB (ref >60)
GLUCOSE: 130 mg/dL — AB (ref 65–99)
POTASSIUM: 4.4 mmol/L (ref 3.5–5.1)
Sodium: 141 mmol/L (ref 135–145)

## 2017-08-19 LAB — CBC
HCT: 33.4 % — ABNORMAL LOW (ref 36.0–46.0)
Hemoglobin: 10.1 g/dL — ABNORMAL LOW (ref 12.0–15.0)
MCH: 24.9 pg — AB (ref 26.0–34.0)
MCHC: 30.2 g/dL (ref 30.0–36.0)
MCV: 82.5 fL (ref 78.0–100.0)
PLATELETS: 305 10*3/uL (ref 150–400)
RBC: 4.05 MIL/uL (ref 3.87–5.11)
RDW: 17.2 % — AB (ref 11.5–15.5)
WBC: 7.5 10*3/uL (ref 4.0–10.5)

## 2017-08-19 MED ORDER — ERYTHROMYCIN 5 MG/GM OP OINT
TOPICAL_OINTMENT | Freq: Three times a day (TID) | OPHTHALMIC | Status: DC
  Filled 2017-08-19: qty 3.5

## 2017-08-19 NOTE — Progress Notes (Signed)
CSW contacted Lauren from [facility] at [phone] to let her know that the facility would have to contact palliative for patient.  Reed Breech LCSWA [phone]

## 2017-08-19 NOTE — Progress Notes (Addendum)
Patient has not voided all night. Bladder scanned and 349 in bladder. Patient denies having to pee. Will continue to monitor.   Patient ambulated to bathroom with 2 assist. Voided 337ml and had BM

## 2017-08-19 NOTE — Progress Notes (Signed)
Patient will Discharge To: [facility] Anticipated DC Date: 08/19/17 Family Notified:yes, [name], [place] Transport By: Corey Harold   Per MD patient ready for DC to [facility] . RN, patient, patient's family, and facility notified of DC. Assessment, Fl2/Pasrr, and Discharge Summary sent to facility. RN given number for report [phone] as for [facility]). DC packet on chart. Ambulance transport requested for patient.   CSW signing off.  Reed Breech LCSWA [phone]

## 2017-08-19 NOTE — Discharge Summary (Signed)
Physician Discharge Summary  Lisa Villegas CHE:[id number] DOB: Dec 16, 1930 DOA: 08/18/2017  PCP: [name], MD  Admit date: 08/18/2017 Discharge date: 08/19/2017  Admitted From: Assisted Living  Disposition:  [facility]   Recommendations for Outpatient Follow-up:  1. Please refer to Geriatrics or other dementia specialist for evaluation of recent decline, and evaluation of possible dementia or depression 2. Please obtain BMP in 1 week 3. Please ensure patient has had Stroke Neurology follow up for recent stroke   Home Health: None  Equipment/Devices: N/A  Discharge Condition: Fair  CODE STATUS: DO NOT RESUSCITATE Diet recommendation: Regular  Brief/Interim Summary: Lisa Villegas is an 84 y.o. F with autism, hx TIA, hx Hodgkin's disease, IDA, dCHF, hypertension, vasovagal syncope and recent failure to thrive, who presents with unresponsive episode, hypotension.  Patient unable to provide history due to cognitive deficits.  Per ED records and guardian by phone, the patient has had a gradual decline over months.  While she used to be ambulatory and interactive, in the last few months, she has had weight loss, she has reduced her activity to lying in bed only, and her oral intake has dwindled.  There is also a question of whether she is demonstrating memory loss, although at baseline she speaks very little, and so assessment for memory loss is understandably difficult.    Per report, the patient was assessed the morning of admission, found unresponsive and with BP 70/40 mmHg.  She was given a small fluid bolus en route, and by arrival in the ER, she was at baseline mentation and BP was 120/80 mmHg.  She had stable chronic anemia, mild leukocytosis, and mild bump in creatinine (1.3 mg/dL from baseline 0.9).         Dehydration Overnight, she was given IV fluids. Creatinine improved to 1.1. Her mentation returned to baseline, and patient reported she felt her  normal self.  She had no fever.  WBC normalized without antibiotics.   -Repeat BMP in 1 week  Dementia vs depression Failure to thrive Dementia and depression would both be exceedingly difficult to diagnose in this patient due to underlying developmental abnormalities and cognitive deficits.  However, her guardian provides a clear history of declining function over the last 6-12 months, including weight loss, reduced activity, and dwindling PO intake.   -Recommend referral to Geriatrics or Geriatric Neurology -Recommend concurrent referral to Palliative Care  Hypertension History of TIA BP normalized with fluids.  BP medicines should be resumed at discharge.  Plavix and aspirin were continued.    Diabetes Resume metformin in 1 week if creatinine improved  Chronic diastolic CHF   Discharge Diagnoses:  Active Problems:   Type II diabetes mellitus with renal manifestations (HCC)   Hypertension   Incontinence of feces   Urinary incontinence without sensory awareness   Hyperlipidemia   Anxiety and depression   Autism   Hodgkin disease ([place])   HTN (hypertension), benign   CKD (chronic kidney disease), stage III (HCC)   Anemia   Diabetes mellitus, controlled ([place])   FTT (failure to thrive) in adult   Hypotension    Discharge Instructions  Discharge Instructions    Diet general   Complete by:  As directed    Discharge instructions   Complete by:  As directed    Lisa Villegas was admitted for dehydration. She was given fluids and this resolved. Her dehydration is a result of poor oral intake, which appears to be partly a progression of dementia, and  partially volitional.  There was no indication at the hospital that it was due to any other underlying or reversible cause. As such, she should be encouraged to have liberal oral intake.  Please re-refer the patient to [facility] on Tuesday. Her case was discussed with Hosp[facility] and [facility], who will follow up this week.   Increase activity slowly   Complete by:  As directed      Allergies as of 08/19/2017      Reactions   Penicillins Rash   On MAR: Has patient had a PCN reaction causing immediate rash, facial/tongue/throat swelling, SOB or lightheadedness with hypotension: Yes Has patient had a PCN reaction causing severe rash involving mucus membranes or skin necrosis: Unk Has patient had a PCN reaction that required hospitalization: Unk Has patient had a PCN reaction occurring within the last 10 years: Unk If all of the above answers are "NO", then may proceed with Cephalosporin use.      Medication List    STOP taking these medications   metFORMIN 500 MG tablet Commonly known as:  GLUCOPHAGE     TAKE these medications   aspirin 81 MG tablet Take 1 tablet (81 mg total) by mouth daily.   atorvastatin 20 MG tablet Commonly known as:  LIPITOR Take 1 tablet (20 mg total) by mouth every evening.   BABY SHAMPOO Sham Use to cleanse eyes and eyelids at bedtime   calcium carbonate 750 MG chewable tablet Commonly known as:  TUMS EX Chew 1 tablet by mouth daily.   CERTA-VITE PO Take 1 tablet by mouth daily.   clopidogrel 75 MG tablet Commonly known as:  PLAVIX Take 1 tablet (75 mg total) by mouth daily.   CREST PRO-HEALTH MT by Mouth Rinse route 2 (two) times daily.   ferrous sulfate 325 (65 FE) MG tablet Take 325 mg by mouth daily.   fesoterodine 8 MG Tb24 tablet Commonly known as:  TOVIAZ TAKE 1 TAB BY MOUTH EVERY DAY.  DO NOT CRUSH OR CHEW .   GLUCERNA PO Take 1 Can by mouth 2 (two) times daily.   hydrALAZINE 10 MG tablet Commonly known as:  APRESOLINE Take 1 tablet (10 mg total) by mouth 3 (three) times daily.   INTEGRA PLUS PO Take 1 capsule by mouth daily.   magnesium chloride 64 MG Tbec SR tablet Commonly known as:  SLOW-MAG Take 1 tablet by mouth 2 (two) times daily.   omeprazole 40 MG capsule Commonly known as:   PRILOSEC Take 1 capsule (40 mg total) by mouth daily.   PREVIDENT 5000 BOOSTER PLUS DT Place 1 application onto teeth every evening.   UNABLE TO FIND Dove sensitive skin bar: use every morning to wash skin   verapamil 120 MG CR tablet Commonly known as:  CALAN-SR Take 120 mg by mouth at bedtime.   Vitamin D3 2000 units Tabs Take 1 tablet by mouth daily.       Allergies  Allergen Reactions  . Penicillins Rash    On MAR: Has patient had a PCN reaction causing immediate rash, facial/tongue/throat swelling, SOB or lightheadedness with hypotension: Yes Has patient had a PCN reaction causing severe rash involving mucus membranes or skin necrosis: Unk Has patient had a PCN reaction that required hospitalization: Unk Has patient had a PCN reaction occurring within the last 10 years: Unk If all of the above answers are "NO", then may proceed with Cephalosporin use.     Consultations:  None   Procedures/Studies: Dg Chest 2 View  Result Date: 07/31/2017 CLINICAL DATA:  Loss of consciousness this morning, history CHF, diabetes mellitus, hypertension, stage III chronic kidney disease EXAM: CHEST - 2 VIEW COMPARISON:  Portable exam of 07/26/2017 FINDINGS: Rotated to the LEFT. Normal heart size, mediastinal contours, and pulmonary vascularity. Atherosclerotic calcifications aorta. Bibasilar atelectasis. Lungs otherwise clear. No pleural effusion or pneumothorax. Bones demineralized. IMPRESSION: Bibasilar atelectasis. Electronically Signed   By: Lavonia Dana M.D.   On: 07/31/2017 10:34   Ct Head Wo Contrast  Result Date: 07/31/2017 CLINICAL DATA:  Seizure, nontraumatic. EXAM: CT HEAD WITHOUT CONTRAST TECHNIQUE: Contiguous axial images were obtained from the base of the skull through the vertex without intravenous contrast. COMPARISON:  07/25/2017 head CT. FINDINGS: Brain: Stable tiny left basal ganglia lacune. No evidence of parenchymal hemorrhage or extra-axial fluid collection. No mass  lesion, mass effect, or midline shift. No CT evidence of acute infarction. Generalized cerebral volume loss. Nonspecific mild subcortical and periventricular white matter hypodensity, most in keeping with chronic small vessel ischemic change. No ventriculomegaly. Vascular: No acute abnormality. Skull: No evidence of calvarial fracture. Sinuses/Orbits: Minimal partial opacification of the left sphenoid sinus. No fluid levels. Other:  The mastoid air cells are unopacified. IMPRESSION: 1.  No evidence of acute intracranial abnormality. 2. Generalized cerebral volume loss, stable tiny left basal ganglia lacune and mild chronic small vessel ischemic changes in the cerebral white matter. Electronically Signed   By: Ilona Sorrel M.D.   On: 07/31/2017 10:28   Ct Head Wo Contrast  Result Date: 07/25/2017 CLINICAL DATA:  Unable wall. EXAM: CT HEAD WITHOUT CONTRAST TECHNIQUE: Contiguous axial images were obtained from the base of the skull through the vertex without intravenous contrast. COMPARISON:  CT scan of February 05, 2016. FINDINGS: Brain: Mild diffuse cortical atrophy is noted. Mild chronic ischemic white matter disease is noted. No mass effect or midline shift is noted. Ventricular size is within normal limits. There is no evidence of mass lesion, hemorrhage or acute infarction. Vascular: No hyperdense vessel or unexpected calcification. Skull: Normal. Negative for fracture or focal lesion. Sinuses/Orbits: Left sphenoid sinusitis. Other: None. IMPRESSION: Mild diffuse cortical atrophy. Mild chronic ischemic white matter disease. No acute intracranial abnormality seen. Electronically Signed   By: Marijo Conception, M.D.   On: 07/25/2017 19:35   Mr Brain Wo Contrast  Result Date: 07/26/2017 CLINICAL DATA:  Altered mental status EXAM: MRI HEAD WITHOUT CONTRAST TECHNIQUE: Multiplanar, multiecho pulse sequences of the brain and surrounding structures were obtained without intravenous contrast. COMPARISON:  Head CT  07/25/2017 FINDINGS: BRAIN: The midline structures are normal. There is no acute infarct or acute hemorrhage. No mass lesion, hydrocephalus, dural abnormality or extra-axial collection. Multifocal white matter hyperintensity, most commonly due to chronic ischemic microangiopathy. No age-advanced or lobar predominant atrophy. There is ar focus of chronic microhemorrhage in the left occipital lobe with associated low T1/T2-weighted signal structure measuring 3 mm in diameter. VASCULAR: Major intracranial arterial and venous sinus flow voids are preserved. SKULL AND UPPER CERVICAL SPINE: The visualized skull base, calvarium, upper cervical spine and extracranial soft tissues are normal. SINUSES/ORBITS: No fluid levels or advanced mucosal thickening. No mastoid or middle ear effusion. Normal orbits. IMPRESSION: 1. No acute intracranial abnormality. 2. 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 Electronically Signed   By: Ulyses Jarred M.D.   On: 07/26/2017 00:44   Dg Chest Port 1 View  Result Date: 07/26/2017 CLINICAL DATA:  Altered  mental status EXAM: PORTABLE CHEST 1 VIEW COMPARISON:  None. FINDINGS: Heart size and pulmonary vascularity are normal. Emphysematous changes in the lungs. Central interstitial pattern with bronchial thickening suggesting chronic bronchitis. Linear atelectasis or fibrosis in the right lung base. No focal consolidation. No blunting of costophrenic angles. No pneumothorax. Old right rib fractures. Calcification of the aorta. IMPRESSION: Emphysematous and chronic bronchitic changes in the lungs. Linear atelectasis or fibrosis in the right lung base. No focal consolidation. Aortic atherosclerosis. Electronically Signed   By: Lucienne Capers M.D.   On: 07/26/2017 02:40   Mr Jodene Nam Head Wo Contrast  Result Date: 07/26/2017 CLINICAL DATA:  TIA.  Confusion EXAM: MRA HEAD WITHOUT CONTRAST TECHNIQUE: Angiographic images of the Circle of Willis were obtained  using MRA technique without intravenous contrast. COMPARISON:  MRI head 07/26/2017 FINDINGS: Both vertebral arteries patent to the basilar. Basilar widely patent with mild atherosclerotic irregularity. PICA not visualized possibly due to patient positioning. AICA, superior cerebellar, and posterior cerebral arteries patent proximally. Mild stenosis distal right PCA and high-grade stenosis versus occlusion of the distal left PCA. Atherosclerotic irregularity and moderate stenosis in the cavernous carotid bilaterally. Anterior and middle cerebral arteries patent bilaterally. Atherosclerotic irregularity distal MCA branches bilaterally. Negative for cerebral aneurysm IMPRESSION: Intracranial atherosclerotic disease involving the posterior cerebral arteries and middle cerebral arteries bilaterally. Severe stenosis versus occlusion distal left PCA. Electronically Signed   By: Franchot Gallo M.D.   On: 07/26/2017 11:35       Subjective: Patient alert and responding to questions.  No headache, chest pain, abdominal pain, nausea, vomiting, confusion, weakness, malaise, cough, sputum, fever.  Discharge Exam: Vitals:   08/18/17 2055 08/19/17 0500  BP: (!) 140/48 (!) 153/65  Pulse: 92 79  Resp: (!) 21 17  Temp: 98.7 F (37.1 C) 99 F (37.2 C)  SpO2: 100% 99%   Vitals:   08/18/17 1400 08/18/17 1904 08/18/17 2055 08/19/17 0500  BP: (!) 147/95 (!) 164/75 (!) 140/48 (!) 153/65  Pulse: 92 92 92 79  Resp: (!) 27  (!) 21 17  Temp:  98.9 F (37.2 C) 98.7 F (37.1 C) 99 F (37.2 C)  TempSrc:  Axillary Oral Oral  SpO2: 100% 97% 100% 99%  Weight:      Height:  5\' 2"  (1.575 m)      General: Pt is alert, awake, not in acute distress, sitting up in bed, alert Eyes: Thick discharge is now gone.  No conjunctivitis.  Cardiovascular: RRR, S1/S2 +, no rubs, no gallops Respiratory: CTA bilaterally, no wheezing, no rhonchi Abdominal: Soft, NT, ND, bowel sounds + Extremities: no edema, no cyanosis    The  results of significant diagnostics from this hospitalization (including imaging, microbiology, ancillary and laboratory) are listed below for reference.     Microbiology: Recent Results (from the past 240 hour(s))  Culture, blood (Routine X 2) w Reflex to ID Panel     Status: None (Preliminary result)   Collection Time: 08/18/17  7:10 PM  Result Value Ref Range Status   Specimen Description BLOOD RIGHT ANTECUBITAL  Final   Special Requests   Final    BOTTLES DRAWN AEROBIC ONLY Blood Culture results may not be optimal due to an inadequate volume of blood received in culture bottles   Culture   Final    NO GROWTH < 24 HOURS Performed at [facility] [address], [place], [place] [address]    Report Status PENDING  Incomplete  Culture, blood (Routine X 2) w  Reflex to ID Panel     Status: None (Preliminary result)   Collection Time: 08/18/17  7:17 PM  Result Value Ref Range Status   Specimen Description BLOOD RIGHT ANTECUBITAL  Final   Special Requests   Final    BOTTLES DRAWN AEROBIC ONLY Blood Culture results may not be optimal due to an inadequate volume of blood received in culture bottles   Culture   Final    NO GROWTH < 24 HOURS Performed at [facility] [address] [place], [place] [address]    Report Status PENDING  Incomplete     Labs: BNP (last 3 results) Recent Labs    07/26/17 0040  BNP 62.7   Basic Metabolic Panel: Recent Labs  Lab 08/18/17 1254 08/19/17 0549  NA 138 141  K 4.7 4.4  CL 104 108  CO2 18* 25  GLUCOSE 169* 130*  BUN 25* 30*  CREATININE 1.33* 1.14*  CALCIUM 9.4 9.3   Liver Function Tests: Recent Labs  Lab 08/18/17 1254  AST 22  ALT 19  ALKPHOS 124  BILITOT 0.4  PROT 6.7  ALBUMIN 3.4*   No results for input(s): LIPASE, AMYLASE in the last 168 hours. No results for input(s): AMMONIA in the last 168 hours. CBC: Recent Labs  Lab 08/18/17 1254 08/19/17 0549  WBC 14.4* 7.5  NEUTROABS 12.5*  --   HGB 11.3* 10.1*   HCT 38.5 33.4*  MCV 83.0 82.5  PLT 372 305   Cardiac Enzymes: Recent Labs  Lab 08/18/17 1254  TROPONINI <0.03   BNP: Invalid input(s): POCBNP CBG: No results for input(s): GLUCAP in the last 168 hours. D-Dimer No results for input(s): DDIMER in the last 72 hours. Hgb A1c No results for input(s): HGBA1C in the last 72 hours. Lipid Profile No results for input(s): CHOL, HDL, LDLCALC, TRIG, CHOLHDL, LDLDIRECT in the last 72 hours. Thyroid function studies No results for input(s): TSH, T4TOTAL, T3FREE, THYROIDAB in the last 72 hours.  Invalid input(s): FREET3 Anemia work up No results for input(s): VITAMINB12, FOLATE, FERRITIN, TIBC, IRON, RETICCTPCT in the last 72 hours. Urinalysis    Component Value Date/Time   COLORURINE YELLOW 08/18/2017 1349   APPEARANCEUR HAZY (A) 08/18/2017 1349   LABSPEC 1.015 08/18/2017 1349   PHURINE 5.0 08/18/2017 1349   GLUCOSEU NEGATIVE 08/18/2017 1349   HGBUR NEGATIVE 08/18/2017 1349   BILIRUBINUR NEGATIVE 08/18/2017 1349   BILIRUBINUR n 03/14/2016 1227   KETONESUR NEGATIVE 08/18/2017 1349   PROTEINUR 30 (A) 08/18/2017 1349   UROBILINOGEN negative 03/14/2016 1227   NITRITE NEGATIVE 08/18/2017 1349   LEUKOCYTESUR NEGATIVE 08/18/2017 1349   Sepsis Labs Invalid input(s): PROCALCITONIN,  WBC,  LACTICIDVEN Microbiology Recent Results (from the past 240 hour(s))  Culture, blood (Routine X 2) w Reflex to ID Panel     Status: None (Preliminary result)   Collection Time: 08/18/17  7:10 PM  Result Value Ref Range Status   Specimen Description BLOOD RIGHT ANTECUBITAL  Final   Special Requests   Final    BOTTLES DRAWN AEROBIC ONLY Blood Culture results may not be optimal due to an inadequate volume of blood received in culture bottles   Culture   Final    NO GROWTH < 24 HOURS Performed at [facility], [place] [address], [place],  [address]    Report Status PENDING  Incomplete  Culture, blood (Routine X 2) w Reflex to ID Panel      Status: None (Preliminary result)   Collection  Time: 08/18/17  7:17 PM  Result Value Ref Range Status   Specimen Description BLOOD RIGHT ANTECUBITAL  Final   Special Requests   Final    BOTTLES DRAWN AEROBIC ONLY Blood Culture results may not be optimal due to an inadequate volume of blood received in culture bottles   Culture   Final    NO GROWTH < 24 HOURS Performed at [facility] [address], [place], [place] [address]    Report Status PENDING  Incomplete     Time coordinating discharge: 50 minutes       SIGNED:   Edwin Dada, MD  [facility] 08/19/2017, 1:59 PM

## 2017-08-19 NOTE — Progress Notes (Signed)
Spoke to [facility] at [facility] to give report and answered all questions. PTAR has been called and I will continue to monitor closely and give report upon arrival.   Saddie Benders RN

## 2017-08-19 NOTE — NC FL2 (Addendum)
[place] LEVEL OF CARE SCREENING TOOL     IDENTIFICATION  Patient Name: Lisa Villegas Birthdate: 05/29/1930 Sex: female Admission Date (Current Location): 08/18/2017  [facility] and [place] Number:  Herbalist and Address:  [facility], [place] [address], [place], [place] [address]      Provider Number: [id number]  Attending Physician Name and Address:  Edwin Dada, *  Relative Name and Phone Number:  [name] [phone]    Current Level of Care: Hospital Recommended Level of Care: Assisted Living Facility(with palliative care to follow) Prior Approval Number:    Date Approved/Denied:   PASRR Number:    Discharge Plan: Other (Comment)(Assisted Living)    Current Diagnoses: Patient Active Problem List   Diagnosis Date Noted  . Hypotension 08/18/2017  . Syncope 07/31/2017  . Chronic diastolic heart failure, NYHA class 2 ([place]) 07/31/2017  . Uncontrolled hypertension 07/31/2017  . Diabetes mellitus, controlled ([place]) 07/31/2017  . CKD (chronic kidney disease) stage 3, GFR 30-59 ml/min (HCC) 07/31/2017  . FTT (failure to thrive) in adult 07/31/2017  . History of Hodgkin's disease 07/31/2017  . Tremor of right hand/chronic &benign 07/31/2017  . Hypertensive urgency 07/26/2017  . Acute metabolic encephalopathy 25/36/6440  . GERD (gastroesophageal reflux disease) 07/25/2017  . Hyperkalemia 07/25/2017  . Anemia 11/30/2016  . Anxiety and depression   . Autism   . History of thyroid nodule   . Hodgkin disease ([place])   . HTN (hypertension), benign   . Incontinence   . Lack of sensation   . Poor balance   . CKD (chronic kidney disease), stage III ([place])   . Osteopenia   . Vitamin D deficiency   . Follow-up examination for injury 02/08/2016  . Type II diabetes mellitus with renal manifestations ([place]) 01/01/2016  . Hypertension 01/01/2016  . Incontinence of feces 01/01/2016  . Urinary incontinence without  sensory awareness 01/01/2016  . Hyperlipidemia 01/01/2016    Orientation RESPIRATION BLADDER Height & Weight     Self  Normal Incontinent, External catheter(placed 08/18/2017) Weight: 147 lb (66.7 kg) Height:  5\' 2"  (157.5 cm)  BEHAVIORAL SYMPTOMS/MOOD NEUROLOGICAL BOWEL NUTRITION STATUS      Continent Diet  AMBULATORY STATUS COMMUNICATION OF NEEDS Skin   Limited Assist Verbally Normal                       Personal Care Assistance Level of Assistance  Bathing, Feeding, Dressing Bathing Assistance: Limited assistance Feeding assistance: Independent Dressing Assistance: Limited assistance     Functional Limitations Info  Sight, Hearing, Speech Sight Info: Adequate Hearing Info: Adequate Speech Info: Adequate    SPECIAL CARE FACTORS FREQUENCY  PT (By licensed PT), OT (By licensed OT)                    Contractures Contractures Info: Not present    Additional Factors Info  Code Status, Allergies Code Status Info: DNR Allergies Info: Penicillins            Discharge Medications: Please see discharge summary for a list of discharge medications.  TAKE these medications   aspirin 81 MG tablet Take 1 tablet (81 mg total) by mouth daily.   atorvastatin 20 MG tablet Commonly known as:  LIPITOR Take 1 tablet (20 mg total) by mouth every evening.   BABY SHAMPOO Sham Use to cleanse eyes and eyelids at bedtime   calcium carbonate 750 MG chewable tablet Commonly known  as:  TUMS EX Chew 1 tablet by mouth daily.   CERTA-VITE PO Take 1 tablet by mouth daily.   clopidogrel 75 MG tablet Commonly known as:  PLAVIX Take 1 tablet (75 mg total) by mouth daily.   CREST PRO-HEALTH MT by Mouth Rinse route 2 (two) times daily.   ferrous sulfate 325 (65 FE) MG tablet Take 325 mg by mouth daily.   fesoterodine 8 MG Tb24 tablet Commonly known as:  TOVIAZ TAKE 1 TAB BY MOUTH EVERY DAY.  DO NOT CRUSH OR CHEW .   GLUCERNA PO Take 1 Can by mouth 2  (two) times daily.   hydrALAZINE 10 MG tablet Commonly known as:  APRESOLINE Take 1 tablet (10 mg total) by mouth 3 (three) times daily.   INTEGRA PLUS PO Take 1 capsule by mouth daily.   magnesium chloride 64 MG Tbec SR tablet Commonly known as:  SLOW-MAG Take 1 tablet by mouth 2 (two) times daily.   omeprazole 40 MG capsule Commonly known as:  PRILOSEC Take 1 capsule (40 mg total) by mouth daily.   PREVIDENT 5000 BOOSTER PLUS DT Place 1 application onto teeth every evening.   UNABLE TO FIND Dove sensitive skin bar: use every morning to wash skin   verapamil 120 MG CR tablet Commonly known as:  CALAN-SR Take 120 mg by mouth at bedtime.   Vitamin D3 2000 units Tabs Take 1 tablet by mouth daily.        Relevant Imaging Results:  Relevant Lab Results:   Additional Information SSN:[id number]  [employer], LCSW

## 2017-08-19 NOTE — Care Management Note (Addendum)
Case Management Note  Patient Details  Name: Lisa Villegas MRN: [id number] Date of Birth: 13-Jul-1930  Subjective/Objective:                Consult for palliative services. Patient from [facility]. CSW consult placed. Palliative consult pending.   Action/Plan:  Notified Harmon Pier w [facility] of palliative needs. She states she wil follow up with patient after DC when back at [facility].  Expected Discharge Date:                  Expected Discharge Plan:  Assisted Living / Rest Home  In-House Referral:  Clinical Social Work  Discharge planning Services     Post Acute Care Choice:    Choice offered to:     DME Arranged:    DME Agency:     HH Arranged:    [place] Agency:     Status of Service:  In process, will continue to follow  If discussed at Long Length of Stay Meetings, dates discussed:    Additional Comments:  Carles Collet, RN 08/19/2017, 1:46 PM

## 2017-08-19 NOTE — Progress Notes (Deleted)
The patient has a family friend at bedside to help with transportation home to her husband. They have been given discharge instructions along with a new medication list and what to take today. She has been educated on pacer care instructions and has prescriptions to pick up along with follow up appointments.   Havah Ammon RN    

## 2017-08-19 NOTE — Clinical Social Work Note (Signed)
Clinical Social Work Assessment  Patient Details  Name: Lisa Villegas MRN: [id number] Date of Birth: [birth year]  Date of referral:  08/19/17               Reason for consult:  Discharge Planning                Permission sought to share information with:  Facility Sport and exercise psychologist, Guardian Permission granted to share information::  Yes, Verbal Permission Granted  Name::     Lisa Villegas  Agency::  yes  Relationship::  POA  Contact Information:  yes  Housing/Transportation Living arrangements for the past 2 months:  [place] of Information:  POA, Lisa Villegas Patient Interpreter Needed:  None Criminal Activity/Legal Involvement Pertinent to Current Situation/Hospitalization:  No - Comment as needed Significant Relationships:  Other(Comment)(POA) Lives with:  Self Do you feel safe going back to the place where you live?  Yes Need for family participation in patient care:  Yes (Comment)  Care giving concerns: CSW received consult regarding patient's return to ALF.  CSW spoke with patient's POA.  Patient resides at ALF.  POA is concern that patient may not be receiving the care that she needs.  POA will speak with ALF concerning needs of patient.   Social Worker assessment / plan:  CSW spoke with patient's POA Lisa Villegas regarding patient's return to ALF.  Lisa Villegas, [place] is agreeable for placement at current ALF.  Employment status:  Retired Forensic scientist:  Medicare PT Recommendations:  Not assessed at this time [place] / Referral to community resources:  (NA)  Patient/Family's Response to care:  Patient's POA, Lisa Villegas reports agreement with discharge plan with palliative care to follow.  Patient/Family's Understanding of and Emotional Response to Diagnosis, Current Treatment, and Prognosis:  POA, Lisa Villegas expressed understanding of CSW role and discharge process as well as medical condition.  No questions or concerning about plan or  treatment.  Emotional Assessment Appearance:  Appears stated age Attitude/Demeanor/Rapport:  Reactive Affect (typically observed):  Accepting, Quiet Orientation:  Oriented to Self Alcohol / Substance use:  Not Applicable Psych involvement (Current and /or in the community):  No (Comment)  Discharge Needs  Concerns to be addressed:  Care Coordination, Financial / Insurance Concerns Readmission within the last 30 days:  Yes Current discharge risk:  Dependent with Mobility Barriers to Discharge:  Continued Medical Work up   [employer], [place] 08/19/2017, 4:11 PM

## 2017-08-22 DIAGNOSIS — E1122 Type 2 diabetes mellitus with diabetic chronic kidney disease: Secondary | ICD-10-CM | POA: Diagnosis not present

## 2017-08-22 DIAGNOSIS — Z8673 Personal history of transient ischemic attack (TIA), and cerebral infarction without residual deficits: Secondary | ICD-10-CM | POA: Diagnosis not present

## 2017-08-22 DIAGNOSIS — I509 Heart failure, unspecified: Secondary | ICD-10-CM | POA: Diagnosis not present

## 2017-08-22 DIAGNOSIS — N183 Chronic kidney disease, stage 3 (moderate): Secondary | ICD-10-CM | POA: Diagnosis not present

## 2017-08-22 DIAGNOSIS — D631 Anemia in chronic kidney disease: Secondary | ICD-10-CM | POA: Diagnosis not present

## 2017-08-22 DIAGNOSIS — I130 Hypertensive heart and chronic kidney disease with heart failure and stage 1 through stage 4 chronic kidney disease, or unspecified chronic kidney disease: Secondary | ICD-10-CM | POA: Diagnosis not present

## 2017-08-23 DIAGNOSIS — I130 Hypertensive heart and chronic kidney disease with heart failure and stage 1 through stage 4 chronic kidney disease, or unspecified chronic kidney disease: Secondary | ICD-10-CM | POA: Diagnosis not present

## 2017-08-23 DIAGNOSIS — N183 Chronic kidney disease, stage 3 (moderate): Secondary | ICD-10-CM | POA: Diagnosis not present

## 2017-08-23 DIAGNOSIS — I509 Heart failure, unspecified: Secondary | ICD-10-CM | POA: Diagnosis not present

## 2017-08-23 DIAGNOSIS — Z8673 Personal history of transient ischemic attack (TIA), and cerebral infarction without residual deficits: Secondary | ICD-10-CM | POA: Diagnosis not present

## 2017-08-23 DIAGNOSIS — E1122 Type 2 diabetes mellitus with diabetic chronic kidney disease: Secondary | ICD-10-CM | POA: Diagnosis not present

## 2017-08-23 DIAGNOSIS — D631 Anemia in chronic kidney disease: Secondary | ICD-10-CM | POA: Diagnosis not present

## 2017-08-23 LAB — CULTURE, BLOOD (ROUTINE X 2)
Culture: NO GROWTH
Culture: NO GROWTH

## 2017-08-24 ENCOUNTER — Encounter: Payer: Medicare Other | Admitting: Hospice and Palliative Medicine

## 2017-08-24 DIAGNOSIS — Z8673 Personal history of transient ischemic attack (TIA), and cerebral infarction without residual deficits: Secondary | ICD-10-CM | POA: Diagnosis not present

## 2017-08-24 DIAGNOSIS — Z79899 Other long term (current) drug therapy: Secondary | ICD-10-CM | POA: Diagnosis not present

## 2017-08-24 DIAGNOSIS — E1122 Type 2 diabetes mellitus with diabetic chronic kidney disease: Secondary | ICD-10-CM | POA: Diagnosis not present

## 2017-08-24 DIAGNOSIS — I130 Hypertensive heart and chronic kidney disease with heart failure and stage 1 through stage 4 chronic kidney disease, or unspecified chronic kidney disease: Secondary | ICD-10-CM | POA: Diagnosis not present

## 2017-08-24 DIAGNOSIS — I509 Heart failure, unspecified: Secondary | ICD-10-CM | POA: Diagnosis not present

## 2017-08-24 DIAGNOSIS — N183 Chronic kidney disease, stage 3 (moderate): Secondary | ICD-10-CM | POA: Diagnosis not present

## 2017-08-24 DIAGNOSIS — D631 Anemia in chronic kidney disease: Secondary | ICD-10-CM | POA: Diagnosis not present

## 2017-08-25 ENCOUNTER — Encounter: Payer: Medicare Other | Admitting: Hospice and Palliative Medicine

## 2017-08-29 DIAGNOSIS — N183 Chronic kidney disease, stage 3 (moderate): Secondary | ICD-10-CM | POA: Diagnosis not present

## 2017-08-29 DIAGNOSIS — E1122 Type 2 diabetes mellitus with diabetic chronic kidney disease: Secondary | ICD-10-CM | POA: Diagnosis not present

## 2017-08-29 DIAGNOSIS — I509 Heart failure, unspecified: Secondary | ICD-10-CM | POA: Diagnosis not present

## 2017-08-29 DIAGNOSIS — Z8673 Personal history of transient ischemic attack (TIA), and cerebral infarction without residual deficits: Secondary | ICD-10-CM | POA: Diagnosis not present

## 2017-08-29 DIAGNOSIS — I130 Hypertensive heart and chronic kidney disease with heart failure and stage 1 through stage 4 chronic kidney disease, or unspecified chronic kidney disease: Secondary | ICD-10-CM | POA: Diagnosis not present

## 2017-08-29 DIAGNOSIS — D631 Anemia in chronic kidney disease: Secondary | ICD-10-CM | POA: Diagnosis not present

## 2017-08-31 DIAGNOSIS — D631 Anemia in chronic kidney disease: Secondary | ICD-10-CM | POA: Diagnosis not present

## 2017-08-31 DIAGNOSIS — I509 Heart failure, unspecified: Secondary | ICD-10-CM | POA: Diagnosis not present

## 2017-08-31 DIAGNOSIS — N183 Chronic kidney disease, stage 3 (moderate): Secondary | ICD-10-CM | POA: Diagnosis not present

## 2017-08-31 DIAGNOSIS — I130 Hypertensive heart and chronic kidney disease with heart failure and stage 1 through stage 4 chronic kidney disease, or unspecified chronic kidney disease: Secondary | ICD-10-CM | POA: Diagnosis not present

## 2017-08-31 DIAGNOSIS — E1122 Type 2 diabetes mellitus with diabetic chronic kidney disease: Secondary | ICD-10-CM | POA: Diagnosis not present

## 2017-08-31 DIAGNOSIS — Z8673 Personal history of transient ischemic attack (TIA), and cerebral infarction without residual deficits: Secondary | ICD-10-CM | POA: Diagnosis not present

## 2017-09-01 DIAGNOSIS — E1122 Type 2 diabetes mellitus with diabetic chronic kidney disease: Secondary | ICD-10-CM | POA: Diagnosis not present

## 2017-09-01 DIAGNOSIS — Z8673 Personal history of transient ischemic attack (TIA), and cerebral infarction without residual deficits: Secondary | ICD-10-CM | POA: Diagnosis not present

## 2017-09-01 DIAGNOSIS — N183 Chronic kidney disease, stage 3 (moderate): Secondary | ICD-10-CM | POA: Diagnosis not present

## 2017-09-01 DIAGNOSIS — I130 Hypertensive heart and chronic kidney disease with heart failure and stage 1 through stage 4 chronic kidney disease, or unspecified chronic kidney disease: Secondary | ICD-10-CM | POA: Diagnosis not present

## 2017-09-01 DIAGNOSIS — I509 Heart failure, unspecified: Secondary | ICD-10-CM | POA: Diagnosis not present

## 2017-09-01 DIAGNOSIS — D631 Anemia in chronic kidney disease: Secondary | ICD-10-CM | POA: Diagnosis not present

## 2017-09-05 ENCOUNTER — Encounter: Payer: Medicare Other | Admitting: Hospice and Palliative Medicine

## 2017-09-06 ENCOUNTER — Non-Acute Institutional Stay: Payer: Medicare Other | Admitting: Hospice and Palliative Medicine

## 2017-09-06 DIAGNOSIS — Z8673 Personal history of transient ischemic attack (TIA), and cerebral infarction without residual deficits: Secondary | ICD-10-CM | POA: Diagnosis not present

## 2017-09-06 DIAGNOSIS — I509 Heart failure, unspecified: Secondary | ICD-10-CM | POA: Diagnosis not present

## 2017-09-06 DIAGNOSIS — N183 Chronic kidney disease, stage 3 (moderate): Secondary | ICD-10-CM | POA: Diagnosis not present

## 2017-09-06 DIAGNOSIS — E1122 Type 2 diabetes mellitus with diabetic chronic kidney disease: Secondary | ICD-10-CM | POA: Diagnosis not present

## 2017-09-06 DIAGNOSIS — Z515 Encounter for palliative care: Secondary | ICD-10-CM

## 2017-09-06 DIAGNOSIS — I130 Hypertensive heart and chronic kidney disease with heart failure and stage 1 through stage 4 chronic kidney disease, or unspecified chronic kidney disease: Secondary | ICD-10-CM | POA: Diagnosis not present

## 2017-09-06 DIAGNOSIS — D631 Anemia in chronic kidney disease: Secondary | ICD-10-CM | POA: Diagnosis not present

## 2017-09-06 NOTE — Progress Notes (Signed)
PALLIATIVE CARE CONSULT VISIT   PATIENT NAME: Lisa Villegas DOB: 1930-12-14 MRN: [id number]  PRIMARY CARE PROVIDER: [name], MD  REFERRING PROVIDER: [name], [address] [place], [place] [address]  RESPONSIBLE PARTY:   [name] [phone]   RECOMMENDATIONS and PLAN:  1.Weakness: secondary to multiple comorbidities including but not limited to: autism, Hx of TIA, hx of Hodgkin's disease, D CHF, recent vasovagal syncope requiring observation at the hospital with Iv fluids. She has recently been diagnosed with failure to thrive diagnosis.  She has become less interactive and had some weight loss, and reduced PO intake. She resides in AL setting. She is now getting PT/OT and ST is evaluating her today. Staff reports an improvement as she walked to the dining hall today for the first time in a long time this morning.  I would recommend the following: Encourage resident to go to the dining room. Encourage protein intake...continue Boost BID. Encourage Greek yogurt. Perhaps try an appetite stimulant? Remeron at HS. Weigh weekly and monitor.  2. ACP: DNR form on chart. Will reach out to [facility] and discuss MOST form and goals of care.   I spent 20 minutes providing this consultation,  from 12:15: to 12:35pm. More than 50% of the time in this consultation was spent  interviewing staff, assessing patient and coordinating communication.   HISTORY OF PRESENT ILLNESS:  Lisa Villegas is an 84 y.o.  female with multiple medical problems who had two recent episodes of hospitalizations with  altered level of consciousness and weakness. Palliative Care was asked to help address symptom management and goals of care by the in patient team at the hospital.. Received order from PCP after AL requested.   CODE STATUS: DNR  PPS: weak 50% HOSPICE ELIGIBILITY/DIAGNOSIS: TBD  PAST MEDICAL HISTORY:  Past Medical History:  Diagnosis Date  . Anemia   .  Anxiety and depression    per med rec  . 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 Autism   . Chronic CHF (congestive heart failure) (HCC)    per med rec  . Chronic kidney disease (CKD)    stage 3 per chart in 09/2015  . Diabetes mellitus without complication ([place])   . Diabetic neuropathy ([place])   . History of thyroid nodule    nontoxic goiter per med rec  . Hodgkin disease ([place])    in 73  . HTN (hypertension), benign   . Hyperlipidemia   . Incontinence   . Lack of sensation   . Osteopenia    per med rec  . Poor balance   . Tubular adenoma of colon 01/2017  . Vitamin D deficiency     SOCIAL HX:  Social History   Tobacco Use  . Smoking status: Never Smoker  . Smokeless tobacco: Never Used  Substance Use Topics  . Alcohol use: No    ALLERGIES:  Allergies  Allergen Reactions  . Penicillins Rash    On MAR: Has patient had a PCN reaction causing immediate rash, facial/tongue/throat swelling, SOB or lightheadedness with hypotension: Yes Has patient had a PCN reaction causing severe rash involving mucus membranes or skin necrosis: Unk Has patient had a PCN reaction that required hospitalization: Unk Has patient had a PCN reaction occurring within the last 10 years: Unk If all of the above answers are "NO", then may proceed with Cephalosporin use.      PERTINENT MEDICATIONS:  Outpatient Encounter Medications as of 09/06/2017  Medication Sig  . aspirin 81  MG tablet Take 1 tablet (81 mg total) by mouth daily.  Marland Kitchen atorvastatin (LIPITOR) 20 MG tablet Take 1 tablet (20 mg total) by mouth every evening.  . calcium carbonate (TUMS EX) 750 MG chewable tablet Chew 1 tablet by mouth daily.  . Cetylpyridinium Chloride (CREST PRO-HEALTH MT) by Mouth Rinse route 2 (two) times daily.   . Cholecalciferol (VITAMIN D3) 2000 UNITS TABS Take 1 tablet by mouth daily.   . clopidogrel (PLAVIX) 75 MG tablet Take 1 tablet (75 mg total) by mouth daily.  Marland Kitchen FeFum-FePoly-FA-B Cmp-C-Biot (INTEGRA PLUS PO) Take 1  capsule by mouth daily.  . ferrous sulfate 325 (65 FE) MG tablet Take 325 mg by mouth daily.   . fesoterodine (TOVIAZ) 8 MG TB24 tablet TAKE 1 TAB BY MOUTH EVERY DAY.  DO NOT CRUSH OR CHEW .  . hydrALAZINE (APRESOLINE) 10 MG tablet Take 1 tablet (10 mg total) by mouth 3 (three) times daily.  Candace Gallus Care Products (BABY SHAMPOO) SHAM Use to cleanse eyes and eyelids at bedtime  . magnesium chloride (SLOW-MAG) 64 MG TBEC SR tablet Take 1 tablet by mouth 2 (two) times daily.   . Multiple Vitamins-Minerals (CERTA-VITE PO) Take 1 tablet by mouth daily.   . Nutritional Supplements (GLUCERNA PO) Take 1 Can by mouth 2 (two) times daily.   Marland Kitchen omeprazole (PRILOSEC) 40 MG capsule Take 1 capsule (40 mg total) by mouth daily.  . Sodium Fluoride (PREVIDENT 5000 BOOSTER PLUS DT) Place 1 application onto teeth every evening.   Marland Kitchen UNABLE TO FIND Dove sensitive skin bar: use every morning to wash skin  . verapamil (CALAN-SR) 120 MG CR tablet Take 120 mg by mouth at bedtime.   No facility-administered encounter medications on file as of 09/06/2017.     PHYSICAL EXAM:   General: Pale; lying in bed in NAD; chronically ill appearing Cardiovascular: irreg rhythm; reg rate Pulmonary: CTAB Abdomen: soft, active BS, NTTP Extremities: weak with walking Skin: pale and intact Neurological: arouses easily; answers with short answers; does not engage in conversation; +generalized weakness  Lisa Rancher, NP

## 2017-09-07 DIAGNOSIS — E1122 Type 2 diabetes mellitus with diabetic chronic kidney disease: Secondary | ICD-10-CM | POA: Diagnosis not present

## 2017-09-07 DIAGNOSIS — Z8673 Personal history of transient ischemic attack (TIA), and cerebral infarction without residual deficits: Secondary | ICD-10-CM | POA: Diagnosis not present

## 2017-09-07 DIAGNOSIS — D631 Anemia in chronic kidney disease: Secondary | ICD-10-CM | POA: Diagnosis not present

## 2017-09-07 DIAGNOSIS — I509 Heart failure, unspecified: Secondary | ICD-10-CM | POA: Diagnosis not present

## 2017-09-07 DIAGNOSIS — I130 Hypertensive heart and chronic kidney disease with heart failure and stage 1 through stage 4 chronic kidney disease, or unspecified chronic kidney disease: Secondary | ICD-10-CM | POA: Diagnosis not present

## 2017-09-07 DIAGNOSIS — N183 Chronic kidney disease, stage 3 (moderate): Secondary | ICD-10-CM | POA: Diagnosis not present

## 2017-09-11 DIAGNOSIS — E1122 Type 2 diabetes mellitus with diabetic chronic kidney disease: Secondary | ICD-10-CM | POA: Diagnosis not present

## 2017-09-11 DIAGNOSIS — I130 Hypertensive heart and chronic kidney disease with heart failure and stage 1 through stage 4 chronic kidney disease, or unspecified chronic kidney disease: Secondary | ICD-10-CM | POA: Diagnosis not present

## 2017-09-11 DIAGNOSIS — N183 Chronic kidney disease, stage 3 (moderate): Secondary | ICD-10-CM | POA: Diagnosis not present

## 2017-09-11 DIAGNOSIS — I509 Heart failure, unspecified: Secondary | ICD-10-CM | POA: Diagnosis not present

## 2017-09-11 DIAGNOSIS — Z8673 Personal history of transient ischemic attack (TIA), and cerebral infarction without residual deficits: Secondary | ICD-10-CM | POA: Diagnosis not present

## 2017-09-11 DIAGNOSIS — D631 Anemia in chronic kidney disease: Secondary | ICD-10-CM | POA: Diagnosis not present

## 2017-09-12 DIAGNOSIS — Z8673 Personal history of transient ischemic attack (TIA), and cerebral infarction without residual deficits: Secondary | ICD-10-CM | POA: Diagnosis not present

## 2017-09-12 DIAGNOSIS — D631 Anemia in chronic kidney disease: Secondary | ICD-10-CM | POA: Diagnosis not present

## 2017-09-12 DIAGNOSIS — I509 Heart failure, unspecified: Secondary | ICD-10-CM | POA: Diagnosis not present

## 2017-09-12 DIAGNOSIS — I130 Hypertensive heart and chronic kidney disease with heart failure and stage 1 through stage 4 chronic kidney disease, or unspecified chronic kidney disease: Secondary | ICD-10-CM | POA: Diagnosis not present

## 2017-09-12 DIAGNOSIS — E1122 Type 2 diabetes mellitus with diabetic chronic kidney disease: Secondary | ICD-10-CM | POA: Diagnosis not present

## 2017-09-12 DIAGNOSIS — N183 Chronic kidney disease, stage 3 (moderate): Secondary | ICD-10-CM | POA: Diagnosis not present

## 2017-09-13 DIAGNOSIS — D631 Anemia in chronic kidney disease: Secondary | ICD-10-CM | POA: Diagnosis not present

## 2017-09-13 DIAGNOSIS — Z8673 Personal history of transient ischemic attack (TIA), and cerebral infarction without residual deficits: Secondary | ICD-10-CM | POA: Diagnosis not present

## 2017-09-13 DIAGNOSIS — E1122 Type 2 diabetes mellitus with diabetic chronic kidney disease: Secondary | ICD-10-CM | POA: Diagnosis not present

## 2017-09-13 DIAGNOSIS — N183 Chronic kidney disease, stage 3 (moderate): Secondary | ICD-10-CM | POA: Diagnosis not present

## 2017-09-13 DIAGNOSIS — I130 Hypertensive heart and chronic kidney disease with heart failure and stage 1 through stage 4 chronic kidney disease, or unspecified chronic kidney disease: Secondary | ICD-10-CM | POA: Diagnosis not present

## 2017-09-13 DIAGNOSIS — I509 Heart failure, unspecified: Secondary | ICD-10-CM | POA: Diagnosis not present

## 2017-09-14 DIAGNOSIS — D631 Anemia in chronic kidney disease: Secondary | ICD-10-CM | POA: Diagnosis not present

## 2017-09-14 DIAGNOSIS — I130 Hypertensive heart and chronic kidney disease with heart failure and stage 1 through stage 4 chronic kidney disease, or unspecified chronic kidney disease: Secondary | ICD-10-CM | POA: Diagnosis not present

## 2017-09-14 DIAGNOSIS — I509 Heart failure, unspecified: Secondary | ICD-10-CM | POA: Diagnosis not present

## 2017-09-14 DIAGNOSIS — E1122 Type 2 diabetes mellitus with diabetic chronic kidney disease: Secondary | ICD-10-CM | POA: Diagnosis not present

## 2017-09-14 DIAGNOSIS — N183 Chronic kidney disease, stage 3 (moderate): Secondary | ICD-10-CM | POA: Diagnosis not present

## 2017-09-14 DIAGNOSIS — Z8673 Personal history of transient ischemic attack (TIA), and cerebral infarction without residual deficits: Secondary | ICD-10-CM | POA: Diagnosis not present

## 2017-09-19 DIAGNOSIS — N183 Chronic kidney disease, stage 3 (moderate): Secondary | ICD-10-CM | POA: Diagnosis not present

## 2017-09-19 DIAGNOSIS — Z8673 Personal history of transient ischemic attack (TIA), and cerebral infarction without residual deficits: Secondary | ICD-10-CM | POA: Diagnosis not present

## 2017-09-19 DIAGNOSIS — E1122 Type 2 diabetes mellitus with diabetic chronic kidney disease: Secondary | ICD-10-CM | POA: Diagnosis not present

## 2017-09-19 DIAGNOSIS — D631 Anemia in chronic kidney disease: Secondary | ICD-10-CM | POA: Diagnosis not present

## 2017-09-19 DIAGNOSIS — I509 Heart failure, unspecified: Secondary | ICD-10-CM | POA: Diagnosis not present

## 2017-09-19 DIAGNOSIS — I130 Hypertensive heart and chronic kidney disease with heart failure and stage 1 through stage 4 chronic kidney disease, or unspecified chronic kidney disease: Secondary | ICD-10-CM | POA: Diagnosis not present

## 2017-09-21 DIAGNOSIS — N183 Chronic kidney disease, stage 3 (moderate): Secondary | ICD-10-CM | POA: Diagnosis not present

## 2017-09-21 DIAGNOSIS — I130 Hypertensive heart and chronic kidney disease with heart failure and stage 1 through stage 4 chronic kidney disease, or unspecified chronic kidney disease: Secondary | ICD-10-CM | POA: Diagnosis not present

## 2017-09-21 DIAGNOSIS — I509 Heart failure, unspecified: Secondary | ICD-10-CM | POA: Diagnosis not present

## 2017-09-21 DIAGNOSIS — D631 Anemia in chronic kidney disease: Secondary | ICD-10-CM | POA: Diagnosis not present

## 2017-09-21 DIAGNOSIS — E1122 Type 2 diabetes mellitus with diabetic chronic kidney disease: Secondary | ICD-10-CM | POA: Diagnosis not present

## 2017-09-21 DIAGNOSIS — Z8673 Personal history of transient ischemic attack (TIA), and cerebral infarction without residual deficits: Secondary | ICD-10-CM | POA: Diagnosis not present

## 2017-09-26 DIAGNOSIS — Z8673 Personal history of transient ischemic attack (TIA), and cerebral infarction without residual deficits: Secondary | ICD-10-CM | POA: Diagnosis not present

## 2017-09-26 DIAGNOSIS — E1122 Type 2 diabetes mellitus with diabetic chronic kidney disease: Secondary | ICD-10-CM | POA: Diagnosis not present

## 2017-09-26 DIAGNOSIS — I509 Heart failure, unspecified: Secondary | ICD-10-CM | POA: Diagnosis not present

## 2017-09-26 DIAGNOSIS — N183 Chronic kidney disease, stage 3 (moderate): Secondary | ICD-10-CM | POA: Diagnosis not present

## 2017-09-26 DIAGNOSIS — I130 Hypertensive heart and chronic kidney disease with heart failure and stage 1 through stage 4 chronic kidney disease, or unspecified chronic kidney disease: Secondary | ICD-10-CM | POA: Diagnosis not present

## 2017-09-26 DIAGNOSIS — D631 Anemia in chronic kidney disease: Secondary | ICD-10-CM | POA: Diagnosis not present

## 2017-09-27 DIAGNOSIS — D631 Anemia in chronic kidney disease: Secondary | ICD-10-CM | POA: Diagnosis not present

## 2017-09-27 DIAGNOSIS — Z8673 Personal history of transient ischemic attack (TIA), and cerebral infarction without residual deficits: Secondary | ICD-10-CM | POA: Diagnosis not present

## 2017-09-27 DIAGNOSIS — I509 Heart failure, unspecified: Secondary | ICD-10-CM | POA: Diagnosis not present

## 2017-09-27 DIAGNOSIS — N183 Chronic kidney disease, stage 3 (moderate): Secondary | ICD-10-CM | POA: Diagnosis not present

## 2017-09-27 DIAGNOSIS — I130 Hypertensive heart and chronic kidney disease with heart failure and stage 1 through stage 4 chronic kidney disease, or unspecified chronic kidney disease: Secondary | ICD-10-CM | POA: Diagnosis not present

## 2017-09-27 DIAGNOSIS — E1122 Type 2 diabetes mellitus with diabetic chronic kidney disease: Secondary | ICD-10-CM | POA: Diagnosis not present

## 2017-09-29 DIAGNOSIS — I509 Heart failure, unspecified: Secondary | ICD-10-CM | POA: Diagnosis not present

## 2017-09-29 DIAGNOSIS — I130 Hypertensive heart and chronic kidney disease with heart failure and stage 1 through stage 4 chronic kidney disease, or unspecified chronic kidney disease: Secondary | ICD-10-CM | POA: Diagnosis not present

## 2017-09-29 DIAGNOSIS — D631 Anemia in chronic kidney disease: Secondary | ICD-10-CM | POA: Diagnosis not present

## 2017-09-29 DIAGNOSIS — N183 Chronic kidney disease, stage 3 (moderate): Secondary | ICD-10-CM | POA: Diagnosis not present

## 2017-09-29 DIAGNOSIS — Z8673 Personal history of transient ischemic attack (TIA), and cerebral infarction without residual deficits: Secondary | ICD-10-CM | POA: Diagnosis not present

## 2017-09-29 DIAGNOSIS — E1122 Type 2 diabetes mellitus with diabetic chronic kidney disease: Secondary | ICD-10-CM | POA: Diagnosis not present

## 2017-10-02 DIAGNOSIS — D631 Anemia in chronic kidney disease: Secondary | ICD-10-CM | POA: Diagnosis not present

## 2017-10-02 DIAGNOSIS — Z8673 Personal history of transient ischemic attack (TIA), and cerebral infarction without residual deficits: Secondary | ICD-10-CM | POA: Diagnosis not present

## 2017-10-02 DIAGNOSIS — N183 Chronic kidney disease, stage 3 (moderate): Secondary | ICD-10-CM | POA: Diagnosis not present

## 2017-10-02 DIAGNOSIS — I130 Hypertensive heart and chronic kidney disease with heart failure and stage 1 through stage 4 chronic kidney disease, or unspecified chronic kidney disease: Secondary | ICD-10-CM | POA: Diagnosis not present

## 2017-10-02 DIAGNOSIS — E1122 Type 2 diabetes mellitus with diabetic chronic kidney disease: Secondary | ICD-10-CM | POA: Diagnosis not present

## 2017-10-02 DIAGNOSIS — I509 Heart failure, unspecified: Secondary | ICD-10-CM | POA: Diagnosis not present

## 2017-10-03 DIAGNOSIS — I5032 Chronic diastolic (congestive) heart failure: Secondary | ICD-10-CM | POA: Diagnosis not present

## 2017-10-03 DIAGNOSIS — D649 Anemia, unspecified: Secondary | ICD-10-CM | POA: Diagnosis not present

## 2017-10-03 DIAGNOSIS — N183 Chronic kidney disease, stage 3 (moderate): Secondary | ICD-10-CM | POA: Diagnosis not present

## 2017-10-03 DIAGNOSIS — D508 Other iron deficiency anemias: Secondary | ICD-10-CM | POA: Diagnosis not present

## 2017-10-03 DIAGNOSIS — I130 Hypertensive heart and chronic kidney disease with heart failure and stage 1 through stage 4 chronic kidney disease, or unspecified chronic kidney disease: Secondary | ICD-10-CM | POA: Diagnosis not present

## 2017-10-03 DIAGNOSIS — E1122 Type 2 diabetes mellitus with diabetic chronic kidney disease: Secondary | ICD-10-CM | POA: Diagnosis not present

## 2017-10-03 DIAGNOSIS — E1142 Type 2 diabetes mellitus with diabetic polyneuropathy: Secondary | ICD-10-CM | POA: Diagnosis not present

## 2017-10-03 DIAGNOSIS — I129 Hypertensive chronic kidney disease with stage 1 through stage 4 chronic kidney disease, or unspecified chronic kidney disease: Secondary | ICD-10-CM | POA: Diagnosis not present

## 2017-10-03 DIAGNOSIS — I509 Heart failure, unspecified: Secondary | ICD-10-CM | POA: Diagnosis not present

## 2017-10-03 DIAGNOSIS — Z8673 Personal history of transient ischemic attack (TIA), and cerebral infarction without residual deficits: Secondary | ICD-10-CM | POA: Diagnosis not present

## 2017-10-03 DIAGNOSIS — E785 Hyperlipidemia, unspecified: Secondary | ICD-10-CM | POA: Diagnosis not present

## 2017-10-03 DIAGNOSIS — D631 Anemia in chronic kidney disease: Secondary | ICD-10-CM | POA: Diagnosis not present

## 2017-10-04 DIAGNOSIS — E1122 Type 2 diabetes mellitus with diabetic chronic kidney disease: Secondary | ICD-10-CM | POA: Diagnosis not present

## 2017-10-04 DIAGNOSIS — I130 Hypertensive heart and chronic kidney disease with heart failure and stage 1 through stage 4 chronic kidney disease, or unspecified chronic kidney disease: Secondary | ICD-10-CM | POA: Diagnosis not present

## 2017-10-04 DIAGNOSIS — Z8673 Personal history of transient ischemic attack (TIA), and cerebral infarction without residual deficits: Secondary | ICD-10-CM | POA: Diagnosis not present

## 2017-10-04 DIAGNOSIS — N183 Chronic kidney disease, stage 3 (moderate): Secondary | ICD-10-CM | POA: Diagnosis not present

## 2017-10-04 DIAGNOSIS — D631 Anemia in chronic kidney disease: Secondary | ICD-10-CM | POA: Diagnosis not present

## 2017-10-04 DIAGNOSIS — I509 Heart failure, unspecified: Secondary | ICD-10-CM | POA: Diagnosis not present

## 2017-10-04 DIAGNOSIS — N390 Urinary tract infection, site not specified: Secondary | ICD-10-CM | POA: Diagnosis not present

## 2017-10-11 ENCOUNTER — Encounter: Payer: Self-pay | Admitting: Neurology

## 2017-10-11 ENCOUNTER — Telehealth: Payer: Self-pay | Admitting: Neurology

## 2017-10-11 ENCOUNTER — Ambulatory Visit (INDEPENDENT_AMBULATORY_CARE_PROVIDER_SITE_OTHER): Payer: Medicare Other | Admitting: Neurology

## 2017-10-11 VITALS — BP 116/64 | HR 58 | Ht 61.75 in | Wt 131.4 lb

## 2017-10-11 DIAGNOSIS — R413 Other amnesia: Secondary | ICD-10-CM

## 2017-10-11 DIAGNOSIS — G309 Alzheimer's disease, unspecified: Secondary | ICD-10-CM | POA: Diagnosis not present

## 2017-10-11 DIAGNOSIS — G459 Transient cerebral ischemic attack, unspecified: Secondary | ICD-10-CM | POA: Diagnosis not present

## 2017-10-11 NOTE — Telephone Encounter (Signed)
Medicare/bcbs supp auth: NPR order sent to GI they will reach out to the pt to schedule.

## 2017-10-11 NOTE — Patient Instructions (Signed)
I had a long discussion with the patient and her health power of attorney [name] about her subacute decline in cognition possibly representing dementia but we will need to look for reversible etiologies first. Recommend check dementia panel labs, urine analysis, EEG and MRI scan of the brain. If no reversible causes found may consider trial of Aricept at next visit. I recommend she discontinue Plavix and stay on aspirin alone for stroke prevention and maintain strict control of hypertension with blood pressure goal below 130/90 and lipids with LDL cholesterol goal below 70 mg percent and diabetes with hemoglobin A1c goal below 6.5%. She was advised to use a walker at all times for ambulation for safety purposes. She will return for follow-up in 2 months or call earlier if necessary

## 2017-10-11 NOTE — Progress Notes (Signed)
[facility] [address] [place]. [place] [address] [phone]       OFFICE CONSULT NOTE  [name] Date of Birth:  1930/12/28 Medical Record Number:  [id number]   Referring MD: Ricke Hey  Reason for Referral:  TIA and memory loss  HPI: Lisa Villegas is a 84 year old Caucasian lady is accompanied today by her health power of attorney Lisa Villegas. History is provided by her as well as review of referral notes and electronic medical records. I have personally reviewed imaging films. She is referred to me today for evaluation for cognitive decline following recent hospitalizations for hypotension, dehydration and failure to thrive. Lisa Villegas a 84 y.o.femalewith medical history significant ofautism, hypertension, hyperlipidemia, diabetes mellitus, GERD, depression with anxiety, CKD 3, Hodgkin's disease in remission, iron deficiency anemia,. She has had multiple hospitalizations in May and June 4 hypertension, dehydration, failure to thrive. She has not been eating well for last several months and is getting weaker. She had CT scan that in 5-619 that showed no acute abnormality. MRI scan of the brain on 5/119 showed a remote left occipital cavernoma and mild changes of atrophy and small vessel disease noted abnormality. MRI of the brain on 08/06/17 showed moderate atheromatous changes involving left posterior cerebral artery and to lesser degree middle cerebral arteries. Patient was started on dual antegrade to therapy aspirin and Plavix. Patient was discharged home with home therapy for which she has finished. She is able to walk with a walker but health power of attorney feels that she noticed some cognitive changes. Her memory is poor. She is not speaking as well and she is to. There is diminished interest in instead have interested out for years. She has had gradual decline in her physical health for the last couple of years with initial appetite and not  drinking enough fluids. Patient is a highly functioning autistic has a PhD in a masters degree. She also has remote history of meningitis as a child. She has had lifelong behavioral issues with decreased social interactions. She has no known history of seizures, significant head injury with loss of consciousness or definite stroke or TIA. She has not been evaluated for reversible causes of memory loss and dementia and not tried medications like Aricept and Namenda. There is no history of depression and anxiety are psychoactive medications review of accompanying chart shows that her fasting sugars have all ranged in the 120-140 range and blood pressure has also been adequately controlled. She appears to be tolerating aspirin and Plavix well without significant bruising or bleeding. She has no family history of Alzheimer's dementia ROS:   14 system review of systems is positive for  hearing loss, birth marks, incontinence, diarrhea, urination problems, memory loss, confusion, weakness, tremors and all other systems negative  PMH:  Past Medical History:  Diagnosis Date  . Anemia   . Anxiety and depression    per med rec  . 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 Autism   . Chronic CHF (congestive heart failure) (HCC)    per med rec  . Chronic kidney disease (CKD)    stage 3 per chart in 09/2015  . Diabetes mellitus without complication ([place])   . Diabetic neuropathy ([place])   . History of thyroid nodule    nontoxic goiter per med rec  . Hodgkin disease ([place])    in 85  . HTN (hypertension), benign   . Hyperlipidemia   . Incontinence   . Lack of sensation   .  Osteopenia    per med rec  . Poor balance   . Stroke ([place])   . Tubular adenoma of colon 01/2017  . Vitamin D deficiency     Social History:  Social History   Socioeconomic History  . Marital status: Single    Spouse name: Not on file  . Number of children: 0  . Years of education: Not on file  . Highest education level: Not on file    Occupational History  . Not on file  Social Needs  . Financial resource strain: Not on file  . Food insecurity:    Worry: Not on file    Inability: Not on file  . Transportation needs:    Medical: Not on file    Non-medical: Not on file  Tobacco Use  . Smoking status: Never Smoker  . Smokeless tobacco: Never Used  Substance and Sexual Activity  . Alcohol use: No  . Drug use: No  . Sexual activity: Not on file  Lifestyle  . Physical activity:    Days per week: Not on file    Minutes per session: Not on file  . Stress: Not on file  Relationships  . Social connections:    Talks on phone: Not on file    Gets together: Not on file    Attends religious service: Not on file    Active member of club or organization: Not on file    Attends meetings of clubs or organizations: Not on file    Relationship status: Not on file  . Intimate partner violence:    Fear of current or ex partner: Not on file    Emotionally abused: Not on file    Physically abused: Not on file    Forced sexual activity: Not on file  Other Topics Concern  . Not on file  Social History Narrative   Lives at [facility], single, has a PhD in [place]    Medications:   Current Outpatient Medications on File Prior to Visit  Medication Sig Dispense Refill  . aspirin 81 MG tablet Take 1 tablet (81 mg total) by mouth daily. 30 tablet 0  . atorvastatin (LIPITOR) 20 MG tablet Take 1 tablet (20 mg total) by mouth every evening. 30 tablet 6  . calcium carbonate (TUMS EX) 750 MG chewable tablet Chew 1 tablet by mouth daily.    . Cetylpyridinium Chloride (CREST PRO-HEALTH MT) by Mouth Rinse route 2 (two) times daily.     . Cholecalciferol (VITAMIN D3) 2000 UNITS TABS Take 1 tablet by mouth daily.     Marland Kitchen FeFum-FePoly-FA-B Cmp-C-Biot (INTEGRA PLUS PO) Take 1 capsule by mouth daily.    . ferrous sulfate 325 (65 FE) MG tablet Take 325 mg by mouth daily.     . fesoterodine (TOVIAZ) 8 MG TB24 tablet TAKE 1 TAB  BY MOUTH EVERY DAY.  DO NOT CRUSH OR CHEW . 30 tablet 5  . hydrALAZINE (APRESOLINE) 10 MG tablet Take 1 tablet (10 mg total) by mouth 3 (three) times daily. 90 tablet 3  . Infant Care Products (BABY SHAMPOO) SHAM Use to cleanse eyes and eyelids at bedtime    . magnesium chloride (SLOW-MAG) 64 MG TBEC SR tablet Take 1 tablet by mouth 2 (two) times daily.     . Multiple Vitamins-Minerals (CERTA-VITE PO) Take 1 tablet by mouth daily.     . Nutritional Supplements (GLUCERNA PO) Take 1 Can by mouth 2 (two) times daily.     Marland Kitchen omeprazole (  PRILOSEC) 40 MG capsule Take 1 capsule (40 mg total) by mouth daily. 30 capsule 3  . Sodium Fluoride (PREVIDENT 5000 BOOSTER PLUS DT) Place 1 application onto teeth every evening.     Marland Kitchen UNABLE TO FIND Dove sensitive skin bar: use every morning to wash skin    . verapamil (CALAN-SR) 120 MG CR tablet Take 120 mg by mouth at bedtime.     No current facility-administered medications on file prior to visit.     Allergies:   Allergies  Allergen Reactions  . Penicillins Rash    On MAR: Has patient had a PCN reaction causing immediate rash, facial/tongue/throat swelling, SOB or lightheadedness with hypotension: Yes Has patient had a PCN reaction causing severe rash involving mucus membranes or skin necrosis: Unk Has patient had a PCN reaction that required hospitalization: Unk Has patient had a PCN reaction occurring within the last 10 years: Unk If all of the above answers are "NO", then may proceed with Cephalosporin use.     Physical Exam General: Frail cachectic looking elderly Caucasian lady seated, in no evident distress Head: head normocephalic and atraumatic.   Neck: supple with no carotid or supraclavicular bruits Cardiovascular: regular rate and rhythm, no murmurs Musculoskeletal: no deformity Skin:  no rash/petichiae Vascular:  Normal pulses all extremities  Neurologic Exam Mental Status: Awake and fully alert. Oriented to place and time. Recent and  remote memory intact. Attention span, concentration and fund of knowledge diminished. Slow hesitant speech and increase reaction time. Mini-Mental status exam scored 28/30 with one deficit in attention and orientation. Clock drawing 3/4. Able to name 9 animals with 4 legs. Geriatric depression scale 1 only not depressed Mood and affect appropriate.  Cranial Nerves: Fundoscopic exam reveals sharp disc margins. Pupils equal, briskly reactive to light. Extraocular movements full without nystagmus. Visual fields full to confrontation. Hearing intact. Facial sensation intact. Face, tongue, palate moves normally and symmetrically.  Motor: Globally diminished bulk and tone. Normal strength in all tested extremity muscles except weakness of distal muscles in both feet. Wearing special boots. Sensory.: intact to touch , pinprick , position and vibratory sensation.  Coordination: Rapid alternating movements normal in all extremities. Finger-to-nose and heel-to-shin performed accurately bilaterally. Gait and Station: Arises from chair with some difficulty. Stance is abnormal. With knocked knees and bilateral foot deformities wearing special boots. Uses a wheeled walker to ambulate    Reflexes: 1+ and symmetric. Toes downgoing.        ASSESSMENT:Lisa Villegas a 84 y.o.femalewith medical history significant ofautism, hypertension, hyperlipidemia, diabetes mellitus, GERD, depression with anxiety, CKD 3, Hodgkin's disease in remission, iron deficiency anemia, who presents with  subacute cognitive decline following recent admissions few months ago for failure to thrive, dehydration and hypotension.       PLAN: I had a long discussion with the patient and her health power of attorney Lisa Villegas about her subacute decline in cognition possibly representing dementia but we will need to look for reversible etiologies first. Recommend check dementia panel labs, urine analysis, EEG and MRI scan of the brain. If  no reversible causes found may consider trial of Aricept at next visit. I recommend she discontinue Plavix and stay on aspirin alone for stroke prevention and maintain strict control of hypertension with blood pressure goal below 130/90 and lipids with LDL cholesterol goal below 70 mg percent and diabetes with hemoglobin A1c goal below 6.5%. She was advised to use a walker at all times for ambulation for safety purposes. She will  return for follow-up in 2 months or call earlier if necessary. Greater than 50% time during this 45 and consultation visit was spent on counseling and coordination of care about her memory loss and cognitive decline and answering questions Antony Contras, MD  [facility] [address] [place], [place] [address]  Phone [phone] Fax [phone] Note: This document was prepared with digital dictation and possible smart phrase technology. Any transcriptional errors that result from this process are unintentional.

## 2017-10-12 LAB — URINALYSIS, ROUTINE W REFLEX MICROSCOPIC
BILIRUBIN UA: NEGATIVE
Glucose, UA: NEGATIVE
Ketones, UA: NEGATIVE
Leukocytes, UA: NEGATIVE
Nitrite, UA: NEGATIVE
RBC UA: NEGATIVE
Specific Gravity, UA: 1.021 (ref 1.005–1.030)
UUROB: 0.2 mg/dL (ref 0.2–1.0)
pH, UA: 5.5 (ref 5.0–7.5)

## 2017-10-12 LAB — MICROSCOPIC EXAMINATION: CASTS: NONE SEEN /LPF

## 2017-10-12 LAB — DEMENTIA PANEL
Homocysteine: 10.4 umol/L (ref 0.0–15.0)
RPR Ser Ql: NONREACTIVE
TSH: 1.68 u[IU]/mL (ref 0.450–4.500)
Vitamin B-12: 531 pg/mL (ref 232–1245)

## 2017-10-16 DIAGNOSIS — M6281 Muscle weakness (generalized): Secondary | ICD-10-CM | POA: Diagnosis not present

## 2017-10-16 DIAGNOSIS — Z9181 History of falling: Secondary | ICD-10-CM | POA: Diagnosis not present

## 2017-10-16 DIAGNOSIS — R2681 Unsteadiness on feet: Secondary | ICD-10-CM | POA: Diagnosis not present

## 2017-10-16 DIAGNOSIS — B4489 Other forms of aspergillosis: Secondary | ICD-10-CM | POA: Diagnosis not present

## 2017-10-16 DIAGNOSIS — R2689 Other abnormalities of gait and mobility: Secondary | ICD-10-CM | POA: Diagnosis not present

## 2017-10-17 ENCOUNTER — Other Ambulatory Visit: Payer: Medicare Other

## 2017-10-18 ENCOUNTER — Telehealth: Payer: Self-pay

## 2017-10-18 DIAGNOSIS — R2689 Other abnormalities of gait and mobility: Secondary | ICD-10-CM | POA: Diagnosis not present

## 2017-10-18 DIAGNOSIS — R2681 Unsteadiness on feet: Secondary | ICD-10-CM | POA: Diagnosis not present

## 2017-10-18 DIAGNOSIS — Z9181 History of falling: Secondary | ICD-10-CM | POA: Diagnosis not present

## 2017-10-18 DIAGNOSIS — B4489 Other forms of aspergillosis: Secondary | ICD-10-CM | POA: Diagnosis not present

## 2017-10-18 DIAGNOSIS — M6281 Muscle weakness (generalized): Secondary | ICD-10-CM | POA: Diagnosis not present

## 2017-10-18 NOTE — Telephone Encounter (Signed)
Notes recorded by Marval Regal, RN on 10/18/2017 at 3:53 PM EDT Rn call patients [place] about lab work for Ms. Flanagan. RN stated memory panel,and urine analysis were normal. PTs POA linda verbalizing understanding. ------

## 2017-10-18 NOTE — Telephone Encounter (Signed)
-----   Message from Garvin Fila, MD sent at 10/17/2017  5:38 PM EDT ----- Lisa Villegas inform the patient that memory panel lab and urine analysis were both normal

## 2017-10-19 DIAGNOSIS — R2681 Unsteadiness on feet: Secondary | ICD-10-CM | POA: Diagnosis not present

## 2017-10-19 DIAGNOSIS — M6281 Muscle weakness (generalized): Secondary | ICD-10-CM | POA: Diagnosis not present

## 2017-10-19 DIAGNOSIS — Z9181 History of falling: Secondary | ICD-10-CM | POA: Diagnosis not present

## 2017-10-19 DIAGNOSIS — R2689 Other abnormalities of gait and mobility: Secondary | ICD-10-CM | POA: Diagnosis not present

## 2017-10-19 DIAGNOSIS — B4489 Other forms of aspergillosis: Secondary | ICD-10-CM | POA: Diagnosis not present

## 2017-10-20 DIAGNOSIS — R2689 Other abnormalities of gait and mobility: Secondary | ICD-10-CM | POA: Diagnosis not present

## 2017-10-20 DIAGNOSIS — R2681 Unsteadiness on feet: Secondary | ICD-10-CM | POA: Diagnosis not present

## 2017-10-20 DIAGNOSIS — B4489 Other forms of aspergillosis: Secondary | ICD-10-CM | POA: Diagnosis not present

## 2017-10-20 DIAGNOSIS — Z9181 History of falling: Secondary | ICD-10-CM | POA: Diagnosis not present

## 2017-10-20 DIAGNOSIS — M6281 Muscle weakness (generalized): Secondary | ICD-10-CM | POA: Diagnosis not present

## 2017-10-23 DIAGNOSIS — R2689 Other abnormalities of gait and mobility: Secondary | ICD-10-CM | POA: Diagnosis not present

## 2017-10-23 DIAGNOSIS — Z9181 History of falling: Secondary | ICD-10-CM | POA: Diagnosis not present

## 2017-10-23 DIAGNOSIS — B4489 Other forms of aspergillosis: Secondary | ICD-10-CM | POA: Diagnosis not present

## 2017-10-23 DIAGNOSIS — R2681 Unsteadiness on feet: Secondary | ICD-10-CM | POA: Diagnosis not present

## 2017-10-23 DIAGNOSIS — M6281 Muscle weakness (generalized): Secondary | ICD-10-CM | POA: Diagnosis not present

## 2017-10-24 DIAGNOSIS — R2681 Unsteadiness on feet: Secondary | ICD-10-CM | POA: Diagnosis not present

## 2017-10-24 DIAGNOSIS — B4489 Other forms of aspergillosis: Secondary | ICD-10-CM | POA: Diagnosis not present

## 2017-10-24 DIAGNOSIS — R2689 Other abnormalities of gait and mobility: Secondary | ICD-10-CM | POA: Diagnosis not present

## 2017-10-24 DIAGNOSIS — Z9181 History of falling: Secondary | ICD-10-CM | POA: Diagnosis not present

## 2017-10-24 DIAGNOSIS — M6281 Muscle weakness (generalized): Secondary | ICD-10-CM | POA: Diagnosis not present

## 2017-10-25 DIAGNOSIS — R2681 Unsteadiness on feet: Secondary | ICD-10-CM | POA: Diagnosis not present

## 2017-10-25 DIAGNOSIS — M6281 Muscle weakness (generalized): Secondary | ICD-10-CM | POA: Diagnosis not present

## 2017-10-25 DIAGNOSIS — Z9181 History of falling: Secondary | ICD-10-CM | POA: Diagnosis not present

## 2017-10-25 DIAGNOSIS — R2689 Other abnormalities of gait and mobility: Secondary | ICD-10-CM | POA: Diagnosis not present

## 2017-10-25 DIAGNOSIS — B4489 Other forms of aspergillosis: Secondary | ICD-10-CM | POA: Diagnosis not present

## 2017-10-26 ENCOUNTER — Other Ambulatory Visit: Payer: Medicare Other

## 2017-10-27 DIAGNOSIS — R2681 Unsteadiness on feet: Secondary | ICD-10-CM | POA: Diagnosis not present

## 2017-10-27 DIAGNOSIS — R2689 Other abnormalities of gait and mobility: Secondary | ICD-10-CM | POA: Diagnosis not present

## 2017-10-27 DIAGNOSIS — B4489 Other forms of aspergillosis: Secondary | ICD-10-CM | POA: Diagnosis not present

## 2017-10-27 DIAGNOSIS — M6281 Muscle weakness (generalized): Secondary | ICD-10-CM | POA: Diagnosis not present

## 2017-10-27 DIAGNOSIS — Z9181 History of falling: Secondary | ICD-10-CM | POA: Diagnosis not present

## 2017-10-30 DIAGNOSIS — Z9181 History of falling: Secondary | ICD-10-CM | POA: Diagnosis not present

## 2017-10-30 DIAGNOSIS — B4489 Other forms of aspergillosis: Secondary | ICD-10-CM | POA: Diagnosis not present

## 2017-10-30 DIAGNOSIS — R2681 Unsteadiness on feet: Secondary | ICD-10-CM | POA: Diagnosis not present

## 2017-10-30 DIAGNOSIS — M6281 Muscle weakness (generalized): Secondary | ICD-10-CM | POA: Diagnosis not present

## 2017-10-30 DIAGNOSIS — R2689 Other abnormalities of gait and mobility: Secondary | ICD-10-CM | POA: Diagnosis not present

## 2017-10-31 DIAGNOSIS — R2689 Other abnormalities of gait and mobility: Secondary | ICD-10-CM | POA: Diagnosis not present

## 2017-10-31 DIAGNOSIS — Z9181 History of falling: Secondary | ICD-10-CM | POA: Diagnosis not present

## 2017-10-31 DIAGNOSIS — M6281 Muscle weakness (generalized): Secondary | ICD-10-CM | POA: Diagnosis not present

## 2017-10-31 DIAGNOSIS — B4489 Other forms of aspergillosis: Secondary | ICD-10-CM | POA: Diagnosis not present

## 2017-10-31 DIAGNOSIS — R2681 Unsteadiness on feet: Secondary | ICD-10-CM | POA: Diagnosis not present

## 2017-11-01 ENCOUNTER — Other Ambulatory Visit: Payer: Medicare Other

## 2017-11-01 DIAGNOSIS — R2681 Unsteadiness on feet: Secondary | ICD-10-CM | POA: Diagnosis not present

## 2017-11-01 DIAGNOSIS — Z9181 History of falling: Secondary | ICD-10-CM | POA: Diagnosis not present

## 2017-11-01 DIAGNOSIS — B4489 Other forms of aspergillosis: Secondary | ICD-10-CM | POA: Diagnosis not present

## 2017-11-01 DIAGNOSIS — M6281 Muscle weakness (generalized): Secondary | ICD-10-CM | POA: Diagnosis not present

## 2017-11-01 DIAGNOSIS — R2689 Other abnormalities of gait and mobility: Secondary | ICD-10-CM | POA: Diagnosis not present

## 2017-11-02 DIAGNOSIS — Z9181 History of falling: Secondary | ICD-10-CM | POA: Diagnosis not present

## 2017-11-02 DIAGNOSIS — M6281 Muscle weakness (generalized): Secondary | ICD-10-CM | POA: Diagnosis not present

## 2017-11-02 DIAGNOSIS — R2681 Unsteadiness on feet: Secondary | ICD-10-CM | POA: Diagnosis not present

## 2017-11-02 DIAGNOSIS — R2689 Other abnormalities of gait and mobility: Secondary | ICD-10-CM | POA: Diagnosis not present

## 2017-11-02 DIAGNOSIS — B4489 Other forms of aspergillosis: Secondary | ICD-10-CM | POA: Diagnosis not present

## 2017-11-03 DIAGNOSIS — M6281 Muscle weakness (generalized): Secondary | ICD-10-CM | POA: Diagnosis not present

## 2017-11-03 DIAGNOSIS — R2681 Unsteadiness on feet: Secondary | ICD-10-CM | POA: Diagnosis not present

## 2017-11-03 DIAGNOSIS — B4489 Other forms of aspergillosis: Secondary | ICD-10-CM | POA: Diagnosis not present

## 2017-11-03 DIAGNOSIS — R2689 Other abnormalities of gait and mobility: Secondary | ICD-10-CM | POA: Diagnosis not present

## 2017-11-03 DIAGNOSIS — Z9181 History of falling: Secondary | ICD-10-CM | POA: Diagnosis not present

## 2017-11-06 DIAGNOSIS — R2689 Other abnormalities of gait and mobility: Secondary | ICD-10-CM | POA: Diagnosis not present

## 2017-11-06 DIAGNOSIS — Z9181 History of falling: Secondary | ICD-10-CM | POA: Diagnosis not present

## 2017-11-06 DIAGNOSIS — R2681 Unsteadiness on feet: Secondary | ICD-10-CM | POA: Diagnosis not present

## 2017-11-06 DIAGNOSIS — B4489 Other forms of aspergillosis: Secondary | ICD-10-CM | POA: Diagnosis not present

## 2017-11-06 DIAGNOSIS — M6281 Muscle weakness (generalized): Secondary | ICD-10-CM | POA: Diagnosis not present

## 2017-11-07 DIAGNOSIS — R2681 Unsteadiness on feet: Secondary | ICD-10-CM | POA: Diagnosis not present

## 2017-11-07 DIAGNOSIS — M6281 Muscle weakness (generalized): Secondary | ICD-10-CM | POA: Diagnosis not present

## 2017-11-07 DIAGNOSIS — Z9181 History of falling: Secondary | ICD-10-CM | POA: Diagnosis not present

## 2017-11-07 DIAGNOSIS — R2689 Other abnormalities of gait and mobility: Secondary | ICD-10-CM | POA: Diagnosis not present

## 2017-11-07 DIAGNOSIS — B4489 Other forms of aspergillosis: Secondary | ICD-10-CM | POA: Diagnosis not present

## 2017-11-08 ENCOUNTER — Encounter: Payer: Self-pay | Admitting: Nurse Practitioner

## 2017-11-08 ENCOUNTER — Non-Acute Institutional Stay: Payer: Medicare Other | Admitting: Nurse Practitioner

## 2017-11-08 VITALS — BP 122/70 | HR 88 | Resp 18 | Wt 133.6 lb

## 2017-11-08 DIAGNOSIS — Z515 Encounter for palliative care: Secondary | ICD-10-CM

## 2017-11-08 DIAGNOSIS — R2689 Other abnormalities of gait and mobility: Secondary | ICD-10-CM | POA: Diagnosis not present

## 2017-11-08 DIAGNOSIS — R2681 Unsteadiness on feet: Secondary | ICD-10-CM | POA: Diagnosis not present

## 2017-11-08 DIAGNOSIS — F0390 Unspecified dementia without behavioral disturbance: Secondary | ICD-10-CM

## 2017-11-08 DIAGNOSIS — Z9181 History of falling: Secondary | ICD-10-CM | POA: Diagnosis not present

## 2017-11-08 DIAGNOSIS — R627 Adult failure to thrive: Secondary | ICD-10-CM

## 2017-11-08 DIAGNOSIS — B4489 Other forms of aspergillosis: Secondary | ICD-10-CM | POA: Diagnosis not present

## 2017-11-08 DIAGNOSIS — R269 Unspecified abnormalities of gait and mobility: Secondary | ICD-10-CM

## 2017-11-08 DIAGNOSIS — M6281 Muscle weakness (generalized): Secondary | ICD-10-CM | POA: Diagnosis not present

## 2017-11-08 NOTE — Progress Notes (Addendum)
PALLIATIVE CARE CONSULT VISIT   PATIENT NAME: Lisa Villegas DOB: 21-Dec-1930 MRN: [id number]  PRIMARY CARE PROVIDER:   [name], MD  REFERRING PROVIDER:  Merrilee Villegas, [address] [place], [place] [address]  RESPONSIBLE PARTY:  Lisa Villegas [phone]   ASSESSMENT/RECOMMENDATIONS:  -Dementia s/p CVA -gait disturbance -FTT/anemia -continue asa and plavix -uses wheelchair -Weight since Jan/19-July/19 137.8-133.6 -continue supplements/boost BID -MVI -ferrous sulfate  Chronic medical issues -HTN -HLD -Osteoporosis -GERD -overactive bladder -hydralazine and verapamil -atorvastatin -calcium and vitamin D -omeprazole -consider d/cing fesoterodine; patient continues to be incontinent; most likely 2/2 cognitive decline  ACP -DNR    I spent 15 minutes providing this consultation,  from 1:30 to 1:45. More than 50% of the time in this consultation was spent coordinating communication.   HISTORY OF PRESENT ILLNESS:  Lisa Villegas is a 84 y.o. year old female with multiple medical problems including s/ CVA, dementia, gait disturbance, FTT/weight loss, HTN, HLD. Palliative Care was asked to assist with symptom management, continue ongoing patient and family support .   CODE STATUS: DNR  PPS: 40% HOSPICE ELIGIBILITY/DIAGNOSIS: TBD  PAST MEDICAL HISTORY:  Past Medical History:  Diagnosis Date  . Anemia   . Anxiety and depression    per med rec  . 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 Autism   . Chronic CHF (congestive heart failure) (HCC)    per med rec  . Chronic kidney disease (CKD)    stage 3 per chart in 09/2015  . Diabetes mellitus without complication ([place])   . Diabetic neuropathy ([place])   . History of thyroid nodule    nontoxic goiter per med rec  . Hodgkin disease ([place])    in 49  . HTN (hypertension), benign   . Hyperlipidemia   . Incontinence   . Lack of sensation   . Osteopenia    per med rec  . Poor balance   . Stroke  ([place])   . Tubular adenoma of colon 01/2017  . Vitamin D deficiency     SOCIAL HX:  Social History   Tobacco Use  . Smoking status: Never Smoker  . Smokeless tobacco: Never Used  Substance Use Topics  . Alcohol use: No    ALLERGIES:  Allergies  Allergen Reactions  . Penicillins Rash    On MAR: Has patient had a PCN reaction causing immediate rash, facial/tongue/throat swelling, SOB or lightheadedness with hypotension: Yes Has patient had a PCN reaction causing severe rash involving mucus membranes or skin necrosis: Unk Has patient had a PCN reaction that required hospitalization: Unk Has patient had a PCN reaction occurring within the last 10 years: Unk If all of the above answers are "NO", then may proceed with Cephalosporin use.      PERTINENT MEDICATIONS:  Outpatient Encounter Medications as of 11/08/2017  Medication Sig  . aspirin 81 MG tablet Take 1 tablet (81 mg total) by mouth daily.  Marland Kitchen atorvastatin (LIPITOR) 20 MG tablet Take 1 tablet (20 mg total) by mouth every evening.  . calcium carbonate (TUMS EX) 750 MG chewable tablet Chew 1 tablet by mouth daily.  . Cetylpyridinium Chloride (CREST PRO-HEALTH MT) by Mouth Rinse route 2 (two) times daily.   . Cholecalciferol (VITAMIN D3) 2000 UNITS TABS Take 1 tablet by mouth daily.   Marland Kitchen FeFum-FePoly-FA-B Cmp-C-Biot (INTEGRA PLUS PO) Take 1 capsule by mouth daily.  . ferrous sulfate 325 (65 FE) MG tablet Take 325 mg by mouth daily.   . fesoterodine (TOVIAZ)  8 MG TB24 tablet TAKE 1 TAB BY MOUTH EVERY DAY.  DO NOT CRUSH OR CHEW .  . hydrALAZINE (APRESOLINE) 10 MG tablet Take 1 tablet (10 mg total) by mouth 3 (three) times daily.  Lisa Villegas Care Products (BABY SHAMPOO) SHAM Use to cleanse eyes and eyelids at bedtime  . magnesium chloride (SLOW-MAG) 64 MG TBEC SR tablet Take 1 tablet by mouth 2 (two) times daily.   . Multiple Vitamins-Minerals (CERTA-VITE PO) Take 1 tablet by mouth daily.   . Nutritional Supplements (GLUCERNA PO) Take 1  Can by mouth 2 (two) times daily.   Marland Kitchen omeprazole (PRILOSEC) 40 MG capsule Take 1 capsule (40 mg total) by mouth daily.  . Sodium Fluoride (PREVIDENT 5000 BOOSTER PLUS DT) Place 1 application onto teeth every evening.   Marland Kitchen UNABLE TO FIND Dove sensitive skin bar: use every morning to wash skin  . verapamil (CALAN-SR) 120 MG CR tablet Take 120 mg by mouth at bedtime.   No facility-administered encounter medications on file as of 11/08/2017.     PHYSICAL EXAM:   General: NAD, , thin Cardiovascular: regular rate and rhythm Pulmonary: clear ant fields Abdomen: soft, nontender, + bowel sounds GU: no suprapubic tenderness Extremities: no edema, no joint deformities Skin: no rashes Neurological:  nonfocal ; patient would not answer simple questions  Lisa Donofrio G [place], NP

## 2017-11-09 DIAGNOSIS — B4489 Other forms of aspergillosis: Secondary | ICD-10-CM | POA: Diagnosis not present

## 2017-11-09 DIAGNOSIS — R2689 Other abnormalities of gait and mobility: Secondary | ICD-10-CM | POA: Diagnosis not present

## 2017-11-09 DIAGNOSIS — Z9181 History of falling: Secondary | ICD-10-CM | POA: Diagnosis not present

## 2017-11-09 DIAGNOSIS — M6281 Muscle weakness (generalized): Secondary | ICD-10-CM | POA: Diagnosis not present

## 2017-11-09 DIAGNOSIS — R2681 Unsteadiness on feet: Secondary | ICD-10-CM | POA: Diagnosis not present

## 2017-11-10 DIAGNOSIS — R2689 Other abnormalities of gait and mobility: Secondary | ICD-10-CM | POA: Diagnosis not present

## 2017-11-10 DIAGNOSIS — M6281 Muscle weakness (generalized): Secondary | ICD-10-CM | POA: Diagnosis not present

## 2017-11-10 DIAGNOSIS — R2681 Unsteadiness on feet: Secondary | ICD-10-CM | POA: Diagnosis not present

## 2017-11-10 DIAGNOSIS — Z9181 History of falling: Secondary | ICD-10-CM | POA: Diagnosis not present

## 2017-11-10 DIAGNOSIS — B4489 Other forms of aspergillosis: Secondary | ICD-10-CM | POA: Diagnosis not present

## 2017-11-13 DIAGNOSIS — B4489 Other forms of aspergillosis: Secondary | ICD-10-CM | POA: Diagnosis not present

## 2017-11-13 DIAGNOSIS — Z9181 History of falling: Secondary | ICD-10-CM | POA: Diagnosis not present

## 2017-11-13 DIAGNOSIS — M6281 Muscle weakness (generalized): Secondary | ICD-10-CM | POA: Diagnosis not present

## 2017-11-13 DIAGNOSIS — R2681 Unsteadiness on feet: Secondary | ICD-10-CM | POA: Diagnosis not present

## 2017-11-13 DIAGNOSIS — R2689 Other abnormalities of gait and mobility: Secondary | ICD-10-CM | POA: Diagnosis not present

## 2017-11-14 DIAGNOSIS — R2689 Other abnormalities of gait and mobility: Secondary | ICD-10-CM | POA: Diagnosis not present

## 2017-11-14 DIAGNOSIS — B4489 Other forms of aspergillosis: Secondary | ICD-10-CM | POA: Diagnosis not present

## 2017-11-14 DIAGNOSIS — R2681 Unsteadiness on feet: Secondary | ICD-10-CM | POA: Diagnosis not present

## 2017-11-14 DIAGNOSIS — M6281 Muscle weakness (generalized): Secondary | ICD-10-CM | POA: Diagnosis not present

## 2017-11-14 DIAGNOSIS — Z9181 History of falling: Secondary | ICD-10-CM | POA: Diagnosis not present

## 2017-11-15 DIAGNOSIS — R2681 Unsteadiness on feet: Secondary | ICD-10-CM | POA: Diagnosis not present

## 2017-11-15 DIAGNOSIS — Z9181 History of falling: Secondary | ICD-10-CM | POA: Diagnosis not present

## 2017-11-15 DIAGNOSIS — R2689 Other abnormalities of gait and mobility: Secondary | ICD-10-CM | POA: Diagnosis not present

## 2017-11-15 DIAGNOSIS — B4489 Other forms of aspergillosis: Secondary | ICD-10-CM | POA: Diagnosis not present

## 2017-11-15 DIAGNOSIS — M6281 Muscle weakness (generalized): Secondary | ICD-10-CM | POA: Diagnosis not present

## 2017-11-16 DIAGNOSIS — R2681 Unsteadiness on feet: Secondary | ICD-10-CM | POA: Diagnosis not present

## 2017-11-16 DIAGNOSIS — M6281 Muscle weakness (generalized): Secondary | ICD-10-CM | POA: Diagnosis not present

## 2017-11-16 DIAGNOSIS — Z9181 History of falling: Secondary | ICD-10-CM | POA: Diagnosis not present

## 2017-11-16 DIAGNOSIS — R2689 Other abnormalities of gait and mobility: Secondary | ICD-10-CM | POA: Diagnosis not present

## 2017-11-16 DIAGNOSIS — B4489 Other forms of aspergillosis: Secondary | ICD-10-CM | POA: Diagnosis not present

## 2017-11-17 DIAGNOSIS — R2681 Unsteadiness on feet: Secondary | ICD-10-CM | POA: Diagnosis not present

## 2017-11-17 DIAGNOSIS — R2689 Other abnormalities of gait and mobility: Secondary | ICD-10-CM | POA: Diagnosis not present

## 2017-11-17 DIAGNOSIS — B4489 Other forms of aspergillosis: Secondary | ICD-10-CM | POA: Diagnosis not present

## 2017-11-17 DIAGNOSIS — M6281 Muscle weakness (generalized): Secondary | ICD-10-CM | POA: Diagnosis not present

## 2017-11-17 DIAGNOSIS — Z9181 History of falling: Secondary | ICD-10-CM | POA: Diagnosis not present

## 2017-11-20 DIAGNOSIS — Z9181 History of falling: Secondary | ICD-10-CM | POA: Diagnosis not present

## 2017-11-20 DIAGNOSIS — B4489 Other forms of aspergillosis: Secondary | ICD-10-CM | POA: Diagnosis not present

## 2017-11-20 DIAGNOSIS — M6281 Muscle weakness (generalized): Secondary | ICD-10-CM | POA: Diagnosis not present

## 2017-11-20 DIAGNOSIS — R2689 Other abnormalities of gait and mobility: Secondary | ICD-10-CM | POA: Diagnosis not present

## 2017-11-20 DIAGNOSIS — R2681 Unsteadiness on feet: Secondary | ICD-10-CM | POA: Diagnosis not present

## 2017-11-24 DIAGNOSIS — B4489 Other forms of aspergillosis: Secondary | ICD-10-CM | POA: Diagnosis not present

## 2017-11-24 DIAGNOSIS — Z9181 History of falling: Secondary | ICD-10-CM | POA: Diagnosis not present

## 2017-11-24 DIAGNOSIS — R2681 Unsteadiness on feet: Secondary | ICD-10-CM | POA: Diagnosis not present

## 2017-11-24 DIAGNOSIS — M6281 Muscle weakness (generalized): Secondary | ICD-10-CM | POA: Diagnosis not present

## 2017-11-24 DIAGNOSIS — R2689 Other abnormalities of gait and mobility: Secondary | ICD-10-CM | POA: Diagnosis not present

## 2017-11-25 DIAGNOSIS — Z9181 History of falling: Secondary | ICD-10-CM | POA: Diagnosis not present

## 2017-11-25 DIAGNOSIS — R2689 Other abnormalities of gait and mobility: Secondary | ICD-10-CM | POA: Diagnosis not present

## 2017-11-25 DIAGNOSIS — M6281 Muscle weakness (generalized): Secondary | ICD-10-CM | POA: Diagnosis not present

## 2017-11-25 DIAGNOSIS — R2681 Unsteadiness on feet: Secondary | ICD-10-CM | POA: Diagnosis not present

## 2017-11-25 DIAGNOSIS — B4489 Other forms of aspergillosis: Secondary | ICD-10-CM | POA: Diagnosis not present

## 2017-11-28 DIAGNOSIS — B4489 Other forms of aspergillosis: Secondary | ICD-10-CM | POA: Diagnosis not present

## 2017-11-28 DIAGNOSIS — R2689 Other abnormalities of gait and mobility: Secondary | ICD-10-CM | POA: Diagnosis not present

## 2017-11-28 DIAGNOSIS — M6281 Muscle weakness (generalized): Secondary | ICD-10-CM | POA: Diagnosis not present

## 2017-11-28 DIAGNOSIS — R2681 Unsteadiness on feet: Secondary | ICD-10-CM | POA: Diagnosis not present

## 2017-11-28 DIAGNOSIS — Z9181 History of falling: Secondary | ICD-10-CM | POA: Diagnosis not present

## 2017-11-30 DIAGNOSIS — B4489 Other forms of aspergillosis: Secondary | ICD-10-CM | POA: Diagnosis not present

## 2017-11-30 DIAGNOSIS — M6281 Muscle weakness (generalized): Secondary | ICD-10-CM | POA: Diagnosis not present

## 2017-11-30 DIAGNOSIS — R2681 Unsteadiness on feet: Secondary | ICD-10-CM | POA: Diagnosis not present

## 2017-11-30 DIAGNOSIS — R2689 Other abnormalities of gait and mobility: Secondary | ICD-10-CM | POA: Diagnosis not present

## 2017-11-30 DIAGNOSIS — Z9181 History of falling: Secondary | ICD-10-CM | POA: Diagnosis not present

## 2017-12-05 DIAGNOSIS — B4489 Other forms of aspergillosis: Secondary | ICD-10-CM | POA: Diagnosis not present

## 2017-12-05 DIAGNOSIS — R2681 Unsteadiness on feet: Secondary | ICD-10-CM | POA: Diagnosis not present

## 2017-12-05 DIAGNOSIS — R2689 Other abnormalities of gait and mobility: Secondary | ICD-10-CM | POA: Diagnosis not present

## 2017-12-05 DIAGNOSIS — M6281 Muscle weakness (generalized): Secondary | ICD-10-CM | POA: Diagnosis not present

## 2017-12-05 DIAGNOSIS — Z9181 History of falling: Secondary | ICD-10-CM | POA: Diagnosis not present

## 2017-12-06 DIAGNOSIS — M6281 Muscle weakness (generalized): Secondary | ICD-10-CM | POA: Diagnosis not present

## 2017-12-06 DIAGNOSIS — R2681 Unsteadiness on feet: Secondary | ICD-10-CM | POA: Diagnosis not present

## 2017-12-06 DIAGNOSIS — R2689 Other abnormalities of gait and mobility: Secondary | ICD-10-CM | POA: Diagnosis not present

## 2017-12-06 DIAGNOSIS — Z9181 History of falling: Secondary | ICD-10-CM | POA: Diagnosis not present

## 2017-12-06 DIAGNOSIS — B4489 Other forms of aspergillosis: Secondary | ICD-10-CM | POA: Diagnosis not present

## 2017-12-08 DIAGNOSIS — B4489 Other forms of aspergillosis: Secondary | ICD-10-CM | POA: Diagnosis not present

## 2017-12-08 DIAGNOSIS — R2689 Other abnormalities of gait and mobility: Secondary | ICD-10-CM | POA: Diagnosis not present

## 2017-12-08 DIAGNOSIS — Z9181 History of falling: Secondary | ICD-10-CM | POA: Diagnosis not present

## 2017-12-08 DIAGNOSIS — R2681 Unsteadiness on feet: Secondary | ICD-10-CM | POA: Diagnosis not present

## 2017-12-08 DIAGNOSIS — M6281 Muscle weakness (generalized): Secondary | ICD-10-CM | POA: Diagnosis not present

## 2017-12-11 DIAGNOSIS — B4489 Other forms of aspergillosis: Secondary | ICD-10-CM | POA: Diagnosis not present

## 2017-12-11 DIAGNOSIS — R2681 Unsteadiness on feet: Secondary | ICD-10-CM | POA: Diagnosis not present

## 2017-12-11 DIAGNOSIS — R2689 Other abnormalities of gait and mobility: Secondary | ICD-10-CM | POA: Diagnosis not present

## 2017-12-11 DIAGNOSIS — M6281 Muscle weakness (generalized): Secondary | ICD-10-CM | POA: Diagnosis not present

## 2017-12-11 DIAGNOSIS — Z9181 History of falling: Secondary | ICD-10-CM | POA: Diagnosis not present

## 2017-12-12 DIAGNOSIS — B4489 Other forms of aspergillosis: Secondary | ICD-10-CM | POA: Diagnosis not present

## 2017-12-12 DIAGNOSIS — R2689 Other abnormalities of gait and mobility: Secondary | ICD-10-CM | POA: Diagnosis not present

## 2017-12-12 DIAGNOSIS — M6281 Muscle weakness (generalized): Secondary | ICD-10-CM | POA: Diagnosis not present

## 2017-12-12 DIAGNOSIS — R2681 Unsteadiness on feet: Secondary | ICD-10-CM | POA: Diagnosis not present

## 2017-12-12 DIAGNOSIS — Z9181 History of falling: Secondary | ICD-10-CM | POA: Diagnosis not present

## 2017-12-14 DIAGNOSIS — M6281 Muscle weakness (generalized): Secondary | ICD-10-CM | POA: Diagnosis not present

## 2017-12-14 DIAGNOSIS — R2681 Unsteadiness on feet: Secondary | ICD-10-CM | POA: Diagnosis not present

## 2017-12-14 DIAGNOSIS — B4489 Other forms of aspergillosis: Secondary | ICD-10-CM | POA: Diagnosis not present

## 2017-12-14 DIAGNOSIS — Z9181 History of falling: Secondary | ICD-10-CM | POA: Diagnosis not present

## 2017-12-14 DIAGNOSIS — R2689 Other abnormalities of gait and mobility: Secondary | ICD-10-CM | POA: Diagnosis not present

## 2017-12-18 ENCOUNTER — Telehealth: Payer: Self-pay

## 2017-12-18 ENCOUNTER — Ambulatory Visit: Payer: Medicare Other | Admitting: Neurology

## 2017-12-18 NOTE — Telephone Encounter (Signed)
PT no show for appt today. 

## 2017-12-19 DIAGNOSIS — R2689 Other abnormalities of gait and mobility: Secondary | ICD-10-CM | POA: Diagnosis not present

## 2017-12-19 DIAGNOSIS — Z9181 History of falling: Secondary | ICD-10-CM | POA: Diagnosis not present

## 2017-12-19 DIAGNOSIS — M6281 Muscle weakness (generalized): Secondary | ICD-10-CM | POA: Diagnosis not present

## 2017-12-19 DIAGNOSIS — R2681 Unsteadiness on feet: Secondary | ICD-10-CM | POA: Diagnosis not present

## 2017-12-19 DIAGNOSIS — B4489 Other forms of aspergillosis: Secondary | ICD-10-CM | POA: Diagnosis not present

## 2017-12-20 ENCOUNTER — Encounter: Payer: Self-pay | Admitting: Neurology

## 2017-12-20 DIAGNOSIS — B4489 Other forms of aspergillosis: Secondary | ICD-10-CM | POA: Diagnosis not present

## 2017-12-20 DIAGNOSIS — Z9181 History of falling: Secondary | ICD-10-CM | POA: Diagnosis not present

## 2017-12-20 DIAGNOSIS — R2681 Unsteadiness on feet: Secondary | ICD-10-CM | POA: Diagnosis not present

## 2017-12-20 DIAGNOSIS — M6281 Muscle weakness (generalized): Secondary | ICD-10-CM | POA: Diagnosis not present

## 2017-12-20 DIAGNOSIS — R2689 Other abnormalities of gait and mobility: Secondary | ICD-10-CM | POA: Diagnosis not present

## 2017-12-22 DIAGNOSIS — M6281 Muscle weakness (generalized): Secondary | ICD-10-CM | POA: Diagnosis not present

## 2017-12-22 DIAGNOSIS — Z9181 History of falling: Secondary | ICD-10-CM | POA: Diagnosis not present

## 2017-12-22 DIAGNOSIS — B4489 Other forms of aspergillosis: Secondary | ICD-10-CM | POA: Diagnosis not present

## 2017-12-22 DIAGNOSIS — R2681 Unsteadiness on feet: Secondary | ICD-10-CM | POA: Diagnosis not present

## 2017-12-22 DIAGNOSIS — R2689 Other abnormalities of gait and mobility: Secondary | ICD-10-CM | POA: Diagnosis not present

## 2017-12-25 DIAGNOSIS — Z9181 History of falling: Secondary | ICD-10-CM | POA: Diagnosis not present

## 2017-12-25 DIAGNOSIS — R2689 Other abnormalities of gait and mobility: Secondary | ICD-10-CM | POA: Diagnosis not present

## 2017-12-25 DIAGNOSIS — B4489 Other forms of aspergillosis: Secondary | ICD-10-CM | POA: Diagnosis not present

## 2017-12-25 DIAGNOSIS — M6281 Muscle weakness (generalized): Secondary | ICD-10-CM | POA: Diagnosis not present

## 2017-12-25 DIAGNOSIS — R2681 Unsteadiness on feet: Secondary | ICD-10-CM | POA: Diagnosis not present

## 2017-12-27 DIAGNOSIS — R2681 Unsteadiness on feet: Secondary | ICD-10-CM | POA: Diagnosis not present

## 2017-12-27 DIAGNOSIS — M6281 Muscle weakness (generalized): Secondary | ICD-10-CM | POA: Diagnosis not present

## 2017-12-27 DIAGNOSIS — R2689 Other abnormalities of gait and mobility: Secondary | ICD-10-CM | POA: Diagnosis not present

## 2017-12-27 DIAGNOSIS — B4489 Other forms of aspergillosis: Secondary | ICD-10-CM | POA: Diagnosis not present

## 2017-12-29 DIAGNOSIS — B4489 Other forms of aspergillosis: Secondary | ICD-10-CM | POA: Diagnosis not present

## 2017-12-29 DIAGNOSIS — M6281 Muscle weakness (generalized): Secondary | ICD-10-CM | POA: Diagnosis not present

## 2017-12-29 DIAGNOSIS — R2681 Unsteadiness on feet: Secondary | ICD-10-CM | POA: Diagnosis not present

## 2017-12-29 DIAGNOSIS — R2689 Other abnormalities of gait and mobility: Secondary | ICD-10-CM | POA: Diagnosis not present

## 2018-01-01 DIAGNOSIS — R2681 Unsteadiness on feet: Secondary | ICD-10-CM | POA: Diagnosis not present

## 2018-01-01 DIAGNOSIS — M6281 Muscle weakness (generalized): Secondary | ICD-10-CM | POA: Diagnosis not present

## 2018-01-01 DIAGNOSIS — B4489 Other forms of aspergillosis: Secondary | ICD-10-CM | POA: Diagnosis not present

## 2018-01-01 DIAGNOSIS — R2689 Other abnormalities of gait and mobility: Secondary | ICD-10-CM | POA: Diagnosis not present

## 2018-02-07 ENCOUNTER — Encounter: Payer: Self-pay | Admitting: Podiatry

## 2018-02-07 ENCOUNTER — Ambulatory Visit (INDEPENDENT_AMBULATORY_CARE_PROVIDER_SITE_OTHER): Payer: Medicare Other | Admitting: Podiatry

## 2018-02-07 DIAGNOSIS — E1142 Type 2 diabetes mellitus with diabetic polyneuropathy: Secondary | ICD-10-CM

## 2018-02-07 DIAGNOSIS — M2042 Other hammer toe(s) (acquired), left foot: Secondary | ICD-10-CM

## 2018-02-07 DIAGNOSIS — M79675 Pain in left toe(s): Secondary | ICD-10-CM | POA: Diagnosis not present

## 2018-02-07 DIAGNOSIS — B351 Tinea unguium: Secondary | ICD-10-CM | POA: Diagnosis not present

## 2018-02-07 DIAGNOSIS — M2041 Other hammer toe(s) (acquired), right foot: Secondary | ICD-10-CM

## 2018-02-07 DIAGNOSIS — M79674 Pain in right toe(s): Secondary | ICD-10-CM | POA: Diagnosis not present

## 2018-02-07 DIAGNOSIS — M2141 Flat foot [pes planus] (acquired), right foot: Secondary | ICD-10-CM

## 2018-02-07 DIAGNOSIS — M2142 Flat foot [pes planus] (acquired), left foot: Secondary | ICD-10-CM

## 2018-02-07 NOTE — Patient Instructions (Addendum)
Onychomycosis/Fungal Toenails  WHAT IS IT? An infection that lies within the keratin of your nail plate that is caused by a fungus.  WHY ME? Fungal infections affect all ages, sexes, races, and creeds.  There may be many factors that predispose you to a fungal infection such as age, coexisting medical conditions such as diabetes, or an autoimmune disease; stress, medications, fatigue, genetics, etc.  Bottom line: fungus thrives in a warm, moist environment and your shoes offer such a location.  IS IT CONTAGIOUS? Theoretically, yes.  You do not want to share shoes, nail clippers or files with someone who has fungal toenails.  Walking around barefoot in the same room or sleeping in the same bed is unlikely to transfer the organism.  It is important to realize, however, that fungus can spread easily from one nail to the next on the same foot.  HOW DO WE TREAT THIS?  There are several ways to treat this condition.  Treatment may depend on many factors such as age, medications, pregnancy, liver and kidney conditions, etc.  It is best to ask your doctor which options are available to you.  1. No treatment.   Unlike many other medical concerns, you can live with this condition.  However for many people this can be a painful condition and may lead to ingrown toenails or a bacterial infection.  It is recommended that you keep the nails cut short to help reduce the amount of fungal nail. 2. Topical treatment.  These range from herbal remedies to prescription strength nail lacquers.  About 40-50% effective, topicals require twice daily application for approximately 9 to 12 months or until an entirely new nail has grown out.  The most effective topicals are medical grade medications available through physicians offices. 3. Oral antifungal medications.  With an 80-90% cure rate, the most common oral medication requires 3 to 4 months of therapy and stays in your system for a year as the new nail grows out.  Oral  antifungal medications do require blood work to make sure it is a safe drug for you.  A liver function panel will be performed prior to starting the medication and after the first month of treatment.  It is important to have the blood work performed to avoid any harmful side effects.  In general, this medication safe but blood work is required. 4. Laser Therapy.  This treatment is performed by applying a specialized laser to the affected nail plate.  This therapy is noninvasive, fast, and non-painful.  It is not covered by insurance and is therefore, out of pocket.  The results have been very good with a 80-95% cure rate.  [facility] is the only practice in the area to offer this therapy. Permanent Nail Avulsion.  Removing the entire nail so that a new nail will not grow back.Diabetes and Foot Care Diabetes may cause you to have problems because of poor blood supply (circulation) to your feet and legs. This may cause the skin on your feet to become thinner, break easier, and heal more slowly. Your skin may become dry, and the skin may peel and crack. You may also have nerve damage in your legs and feet causing decreased feeling in them. You may not notice minor injuries to your feet that could lead to infections or more serious problems. Taking care of your feet is one of the most important things you can do for yourself. Follow these instructions at home:  Wear shoes at all  times, even in the house. Do not go barefoot. Bare feet are easily injured.  Check your feet daily for blisters, cuts, and redness. If you cannot see the bottom of your feet, use a mirror or ask someone for help.  Wash your feet with warm water (do not use hot water) and mild soap. Then pat your feet and the areas between your toes until they are completely dry. Do not soak your feet as this can dry your skin.  Apply a moisturizing lotion or petroleum jelly (that does not contain alcohol and is unscented) to the skin on  your feet and to dry, brittle toenails. Do not apply lotion between your toes.  Trim your toenails straight across. Do not dig under them or around the cuticle. File the edges of your nails with an emery board or nail file.  Do not cut corns or calluses or try to remove them with medicine.  Wear clean socks or stockings every day. Make sure they are not too tight. Do not wear knee-high stockings since they may decrease blood flow to your legs.  Wear shoes that fit properly and have enough cushioning. To break in new shoes, wear them for just a few hours a day. This prevents you from injuring your feet. Always look in your shoes before you put them on to be sure there are no objects inside.  Do not cross your legs. This may decrease the blood flow to your feet.  If you find a minor scrape, cut, or break in the skin on your feet, keep it and the skin around it clean and dry. These areas may be cleansed with mild soap and water. Do not cleanse the area with peroxide, alcohol, or iodine.  When you remove an adhesive bandage, be sure not to damage the skin around it.  If you have a wound, look at it several times a day to make sure it is healing.  Do not use heating pads or hot water bottles. They may burn your skin. If you have lost feeling in your feet or legs, you may not know it is happening until it is too late.  Make sure your health care provider performs a complete foot exam at least annually or more often if you have foot problems. Report any cuts, sores, or bruises to your health care provider immediately. Contact a health care provider if:  You have an injury that is not healing.  You have cuts or breaks in the skin.  You have an ingrown nail.  You notice redness on your legs or feet.  You feel burning or tingling in your legs or feet.  You have pain or cramps in your legs and feet.  Your legs or feet are numb.  Your feet always feel cold. Get help right away if:  There  is increasing redness, swelling, or pain in or around a wound.  There is a red line that goes up your leg.  Pus is coming from a wound.  You develop a fever or as directed by your health care provider.  You notice a bad smell coming from an ulcer or wound. This information is not intended to replace advice given to you by your health care provider. Make sure you discuss any questions you have with your health care provider. Document Released: 03/11/2000 Document Revised: 08/20/2015 Document Reviewed: 08/21/2012 Elsevier Interactive Patient Education  2017 [employer].

## 2018-02-25 ENCOUNTER — Encounter: Payer: Self-pay | Admitting: Podiatry

## 2018-02-25 NOTE — Progress Notes (Signed)
Subjective: Lisa Villegas presents to clinic today accompanied by her [place].  [name] relates Lisa Villegas had a TIA about 6 months ago and she became very nonfunctional.  She she has had physical therapy and still walks.   She presents for follow-up today with painful, thick toenails 1-5 b/l that she cannot cut and which interfere with daily activities.  Pain is aggravated when wearing enclosed shoe gear and relieved with periodic professional debridement.  Lisa Villegas resides at [facility]. [name] is requesting a prescription for diabetic shoes as well as orders for assisted living facility staff to apply diabetic socks and inspect Lisa Villegas's feet daily.  Objective: Vascular Examination: Capillary refill time is immediate to all 10 digits. Dorsalis pedis pulses 2/4 bilaterally Posterior tibial pulses 1/4 bilaterally Skin temperature gradient within normal limits bilaterally  Dermatological Examination: Skin with normal turgor texture and tone bilaterally  Toenails 1-5 b/l are elongated, discolored, thick, dystrophic with subungual debris and pain with palpation to nailbeds due to thickness of nails.  Well-healed surgical scar dorsal right midfoot  No ulcerations.  No interdigital macerations noted bilaterally.  Musculoskeletal: Muscle strength 5/5 to all LE muscle groups Hammertoe second digit left foot Pes planovalgus foot deformity noted bilaterally  Neurological: Sensation intact with 10 gram monofilament. Vibratory sensation intact.  Assessment: 1. Painful onychomycosis toenails 1-5 b/l  2.  Hammertoe left second digit 3.  Pes planovalgus deformity bilaterally 4.  Diabetes with neuropathy  Plan: 1. Toenails 1-5 b/l were debrided in length and girth without iatrogenic bleeding. 2. An order was written for patient to receive one pair custom molded shoes with custom insoles with the following diagnoses: Diabetes with  neuropathy, osteoarthritis of the midfoot, pes planovalgus foot deformity bilateral. 3. An order was written for the assisted living facility to apply diabetic socks and inspect LisaVillegas's feet daily. 4. Patient to continue soft, supportive shoe gear 5. POA to report any pedal injuries to medical professional immediately. 6. Follow up 3 months. POA to call should there be a concern in the interim.

## 2018-03-13 ENCOUNTER — Non-Acute Institutional Stay: Payer: Medicare Other | Admitting: Nurse Practitioner

## 2018-03-13 DIAGNOSIS — Z515 Encounter for palliative care: Secondary | ICD-10-CM

## 2018-03-13 DIAGNOSIS — R269 Unspecified abnormalities of gait and mobility: Secondary | ICD-10-CM

## 2018-03-13 DIAGNOSIS — R634 Abnormal weight loss: Secondary | ICD-10-CM

## 2018-03-13 DIAGNOSIS — F0390 Unspecified dementia without behavioral disturbance: Secondary | ICD-10-CM

## 2018-03-13 NOTE — Progress Notes (Addendum)
PALLIATIVE CARE CONSULT VISIT   PATIENT NAME: Lisa Villegas DOB: 10/04/1930 MRN: [id number]  PRIMARY CARE PROVIDER:   [name], MD  REFERRING PROVIDER:  [name], [address] [place], [place] [address]  RESPONSIBLE PARTY:  [name] [phone]   HISTORY OF PRESENT ILLNESS:  Lisa Villegas is a 84 y.o. year old female with multiple medical problems including s/ CVA, dementia, gait disturbance, FTT/weight loss, HTN, HLD. Palliative Care was asked to assist with symptom management, continue ongoing patient and family support .    Collateral information by facility staff: patient with no acute issues; appetite good weight stable; no behavioral issues    ASSESSMENT/PLAN/RECOMMENDATIONS:  -Dementia s/p CVA -gait disturbance -FTT/anemia -FAST 7A -continue asa and plavix -uses wheelchair -Weight stable -continue supplements/boost BID -MVI -ferrous sulfate   Chronic medical issues -HTN -HLD -Osteoporosis -GERD -overactive bladder -hydralazine and verapamil -atorvastatin -calcium and vitamin D -omeprazole -consider d/cing fesoterodine; patient continues to be incontinent; most likely 2/2 cognitive decline  ACP -DNR     I spent 15 minutes providing this consultation,  from 12:00 to 12:15. More than 50% of the time in this consultation was spent coordinating communication.    CODE STATUS: DNR  PPS: 40% HOSPICE ELIGIBILITY/DIAGNOSIS: TBD  PAST MEDICAL HISTORY:  Past Medical History:  Diagnosis Date  . Anemia   . Anxiety and depression    per med rec  . 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 Autism   . Chronic CHF (congestive heart failure) (HCC)    per med rec  . Chronic kidney disease (CKD)    stage 3 per chart in 09/2015  . Diabetes mellitus without complication ([place])   . Diabetic neuropathy ([place])   . History of thyroid nodule    nontoxic goiter per med rec  . Hodgkin disease ([place])    in 60  . HTN (hypertension),  benign   . Hyperlipidemia   . Incontinence   . Lack of sensation   . Osteopenia    per med rec  . Poor balance   . Stroke ([place])   . Tubular adenoma of colon 01/2017  . Vitamin D deficiency     SOCIAL HX:  Social History   Tobacco Use  . Smoking status: Never Smoker  . Smokeless tobacco: Never Used  Substance Use Topics  . Alcohol use: No    ALLERGIES:  Allergies  Allergen Reactions  . Penicillins Rash    On MAR: Has patient had a PCN reaction causing immediate rash, facial/tongue/throat swelling, SOB or lightheadedness with hypotension: Yes Has patient had a PCN reaction causing severe rash involving mucus membranes or skin necrosis: Unk Has patient had a PCN reaction that required hospitalization: Unk Has patient had a PCN reaction occurring within the last 10 years: Unk If all of the above answers are "NO", then may proceed with Cephalosporin use.      PERTINENT MEDICATIONS:  Outpatient Encounter Medications as of 03/13/2018  Medication Sig  . aspirin 81 MG tablet Take 1 tablet (81 mg total) by mouth daily.  Marland Kitchen atorvastatin (LIPITOR) 20 MG tablet Take 1 tablet (20 mg total) by mouth every evening.  . calcium carbonate (TUMS EX) 750 MG chewable tablet Chew 1 tablet by mouth daily.  . Cetylpyridinium Chloride (CREST PRO-HEALTH MT) by Mouth Rinse route 2 (two) times daily.   . Cholecalciferol (VITAMIN D3) 2000 UNITS TABS Take 1 tablet by mouth daily.   Marland Kitchen FeFum-FePoly-FA-B Cmp-C-Biot (INTEGRA PLUS PO) Take 1  capsule by mouth daily.  . ferrous sulfate 325 (65 FE) MG tablet Take 325 mg by mouth daily.   . fesoterodine (TOVIAZ) 8 MG TB24 tablet TAKE 1 TAB BY MOUTH EVERY DAY.  DO NOT CRUSH OR CHEW .  . hydrALAZINE (APRESOLINE) 10 MG tablet Take 1 tablet (10 mg total) by mouth 3 (three) times daily.  Candace Gallus Care Products (BABY SHAMPOO) SHAM Use to cleanse eyes and eyelids at bedtime  . magnesium chloride (SLOW-MAG) 64 MG TBEC SR tablet Take 1 tablet by mouth 2 (two) times  daily.   . Multiple Vitamins-Minerals (CERTA-VITE PO) Take 1 tablet by mouth daily.   . Nutritional Supplements (GLUCERNA PO) Take 1 Can by mouth 2 (two) times daily.   Marland Kitchen omeprazole (PRILOSEC) 40 MG capsule Take 1 capsule (40 mg total) by mouth daily.  . Sodium Fluoride (PREVIDENT 5000 BOOSTER PLUS DT) Place 1 application onto teeth every evening.   Marland Kitchen UNABLE TO FIND Dove sensitive skin bar: use every morning to wash skin  . verapamil (CALAN-SR) 120 MG CR tablet Take 120 mg by mouth at bedtime.   No facility-administered encounter medications on file as of 03/13/2018.     PHYSICAL EXAM:   General: NAD, Lying in bed Cardiovascular: regular rate and rhythm Pulmonary: clear ant fields Abdomen: soft, nontender, + bowel sounds GU: no suprapubic tenderness Extremities: no edema, no joint deformities Skin: no rashes Neurological:  nonfocal ; patient would not answer simple questions  Stephanie G Martin[place], NP

## 2018-03-14 ENCOUNTER — Encounter: Payer: Self-pay | Admitting: Nurse Practitioner

## 2018-03-30 ENCOUNTER — Emergency Department (HOSPITAL_COMMUNITY): Payer: Medicare Other

## 2018-03-30 ENCOUNTER — Encounter (HOSPITAL_COMMUNITY): Payer: Self-pay

## 2018-03-30 ENCOUNTER — Emergency Department (HOSPITAL_COMMUNITY)
Admission: EM | Admit: 2018-03-30 | Discharge: 2018-03-30 | Payer: Medicare Other | Attending: Emergency Medicine | Admitting: Emergency Medicine

## 2018-03-30 DIAGNOSIS — Y92410 Unspecified street and highway as the place of occurrence of the external cause: Secondary | ICD-10-CM | POA: Insufficient documentation

## 2018-03-30 DIAGNOSIS — S32000A Wedge compression fracture of unspecified lumbar vertebra, initial encounter for closed fracture: Secondary | ICD-10-CM | POA: Diagnosis present

## 2018-03-30 DIAGNOSIS — I5032 Chronic diastolic (congestive) heart failure: Secondary | ICD-10-CM | POA: Diagnosis not present

## 2018-03-30 DIAGNOSIS — F840 Autistic disorder: Secondary | ICD-10-CM | POA: Diagnosis not present

## 2018-03-30 DIAGNOSIS — M542 Cervicalgia: Secondary | ICD-10-CM | POA: Diagnosis not present

## 2018-03-30 DIAGNOSIS — R0602 Shortness of breath: Secondary | ICD-10-CM | POA: Diagnosis not present

## 2018-03-30 DIAGNOSIS — R4020 Unspecified coma: Secondary | ICD-10-CM | POA: Diagnosis not present

## 2018-03-30 DIAGNOSIS — Z79899 Other long term (current) drug therapy: Secondary | ICD-10-CM | POA: Insufficient documentation

## 2018-03-30 DIAGNOSIS — I130 Hypertensive heart and chronic kidney disease with heart failure and stage 1 through stage 4 chronic kidney disease, or unspecified chronic kidney disease: Secondary | ICD-10-CM | POA: Diagnosis not present

## 2018-03-30 DIAGNOSIS — Y939 Activity, unspecified: Secondary | ICD-10-CM | POA: Insufficient documentation

## 2018-03-30 DIAGNOSIS — R109 Unspecified abdominal pain: Secondary | ICD-10-CM | POA: Diagnosis not present

## 2018-03-30 DIAGNOSIS — E1165 Type 2 diabetes mellitus with hyperglycemia: Secondary | ICD-10-CM | POA: Insufficient documentation

## 2018-03-30 DIAGNOSIS — S0990XA Unspecified injury of head, initial encounter: Secondary | ICD-10-CM | POA: Diagnosis not present

## 2018-03-30 DIAGNOSIS — Y999 Unspecified external cause status: Secondary | ICD-10-CM | POA: Diagnosis not present

## 2018-03-30 DIAGNOSIS — S299XXA Unspecified injury of thorax, initial encounter: Secondary | ICD-10-CM | POA: Diagnosis not present

## 2018-03-30 DIAGNOSIS — S3992XA Unspecified injury of lower back, initial encounter: Secondary | ICD-10-CM | POA: Diagnosis present

## 2018-03-30 DIAGNOSIS — R079 Chest pain, unspecified: Secondary | ICD-10-CM | POA: Diagnosis not present

## 2018-03-30 DIAGNOSIS — R51 Headache: Secondary | ICD-10-CM | POA: Diagnosis not present

## 2018-03-30 DIAGNOSIS — N183 Chronic kidney disease, stage 3 (moderate): Secondary | ICD-10-CM | POA: Insufficient documentation

## 2018-03-30 DIAGNOSIS — S199XXA Unspecified injury of neck, initial encounter: Secondary | ICD-10-CM | POA: Diagnosis not present

## 2018-03-30 DIAGNOSIS — I509 Heart failure, unspecified: Secondary | ICD-10-CM | POA: Diagnosis not present

## 2018-03-30 DIAGNOSIS — R0789 Other chest pain: Secondary | ICD-10-CM | POA: Diagnosis not present

## 2018-03-30 DIAGNOSIS — R911 Solitary pulmonary nodule: Secondary | ICD-10-CM | POA: Diagnosis not present

## 2018-03-30 DIAGNOSIS — E1122 Type 2 diabetes mellitus with diabetic chronic kidney disease: Secondary | ICD-10-CM | POA: Insufficient documentation

## 2018-03-30 DIAGNOSIS — Z7982 Long term (current) use of aspirin: Secondary | ICD-10-CM | POA: Diagnosis not present

## 2018-03-30 DIAGNOSIS — Z7401 Bed confinement status: Secondary | ICD-10-CM | POA: Diagnosis not present

## 2018-03-30 DIAGNOSIS — M2550 Pain in unspecified joint: Secondary | ICD-10-CM | POA: Diagnosis not present

## 2018-03-30 DIAGNOSIS — S32030A Wedge compression fracture of third lumbar vertebra, initial encounter for closed fracture: Secondary | ICD-10-CM | POA: Diagnosis not present

## 2018-03-30 DIAGNOSIS — S3991XA Unspecified injury of abdomen, initial encounter: Secondary | ICD-10-CM | POA: Diagnosis not present

## 2018-03-30 LAB — CBC WITH DIFFERENTIAL/PLATELET
Abs Immature Granulocytes: 0.08 10*3/uL — ABNORMAL HIGH (ref 0.00–0.07)
Basophils Absolute: 0.1 10*3/uL (ref 0.0–0.1)
Basophils Relative: 1 %
Eosinophils Absolute: 0.2 10*3/uL (ref 0.0–0.5)
Eosinophils Relative: 2 %
HCT: 41.2 % (ref 36.0–46.0)
Hemoglobin: 11.9 g/dL — ABNORMAL LOW (ref 12.0–15.0)
Immature Granulocytes: 1 %
Lymphocytes Relative: 23 %
Lymphs Abs: 2.1 10*3/uL (ref 0.7–4.0)
MCH: 24.3 pg — ABNORMAL LOW (ref 26.0–34.0)
MCHC: 28.9 g/dL — ABNORMAL LOW (ref 30.0–36.0)
MCV: 84.3 fL (ref 80.0–100.0)
MONO ABS: 0.6 10*3/uL (ref 0.1–1.0)
MONOS PCT: 7 %
Neutro Abs: 5.9 10*3/uL (ref 1.7–7.7)
Neutrophils Relative %: 66 %
Platelets: 329 10*3/uL (ref 150–400)
RBC: 4.89 MIL/uL (ref 3.87–5.11)
RDW: 17.1 % — ABNORMAL HIGH (ref 11.5–15.5)
WBC: 8.9 10*3/uL (ref 4.0–10.5)
nRBC: 0 % (ref 0.0–0.2)

## 2018-03-30 LAB — BASIC METABOLIC PANEL
ANION GAP: 10 (ref 5–15)
BUN: 29 mg/dL — ABNORMAL HIGH (ref 8–23)
CO2: 21 mmol/L — ABNORMAL LOW (ref 22–32)
Calcium: 9.7 mg/dL (ref 8.9–10.3)
Chloride: 107 mmol/L (ref 98–111)
Creatinine, Ser: 1.07 mg/dL — ABNORMAL HIGH (ref 0.44–1.00)
GFR calc Af Amer: 55 mL/min — ABNORMAL LOW (ref >60)
GFR calc non Af Amer: 47 mL/min — ABNORMAL LOW (ref >60)
GLUCOSE: 168 mg/dL — AB (ref 70–99)
POTASSIUM: 4.5 mmol/L (ref 3.5–5.1)
Sodium: 138 mmol/L (ref 135–145)

## 2018-03-30 LAB — CBG MONITORING, ED: Glucose-Capillary: 157 mg/dL — ABNORMAL HIGH (ref 70–99)

## 2018-03-30 MED ORDER — IOHEXOL 300 MG/ML  SOLN
100.0000 mL | Freq: Once | INTRAMUSCULAR | Status: AC | PRN
  Administered 2018-03-30: 100 mL via INTRAVENOUS

## 2018-03-30 NOTE — ED Notes (Signed)
Sport and exercise psychologist at [facility] for report

## 2018-03-30 NOTE — ED Notes (Signed)
PT[facility] called @ 1600-RN-called by Levada Dy

## 2018-03-30 NOTE — ED Notes (Signed)
Pt stable and states understanding follow up.
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
## 2018-03-30 NOTE — ED Notes (Signed)
Lisa Villegas, (legal guardian) called @ 1520 for updated.
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
## 2018-03-30 NOTE — ED Provider Notes (Addendum)
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 compression fracture #3 lung nodule #4 elevated blood pressure Final diagnoses:  None   #5 chronic renal insufficiency #6 hyperglycemia ED Discharge Orders    None       Orlie Dakin, MD 03/30/18 1538    Orlie Dakin, MD 03/30/18 1540

## 2018-04-16 DIAGNOSIS — E1122 Type 2 diabetes mellitus with diabetic chronic kidney disease: Secondary | ICD-10-CM | POA: Diagnosis not present

## 2018-04-16 DIAGNOSIS — E559 Vitamin D deficiency, unspecified: Secondary | ICD-10-CM | POA: Diagnosis not present

## 2018-04-16 DIAGNOSIS — M8580 Other specified disorders of bone density and structure, unspecified site: Secondary | ICD-10-CM | POA: Diagnosis not present

## 2018-04-16 DIAGNOSIS — N183 Chronic kidney disease, stage 3 (moderate): Secondary | ICD-10-CM | POA: Diagnosis not present

## 2018-04-16 DIAGNOSIS — E785 Hyperlipidemia, unspecified: Secondary | ICD-10-CM | POA: Diagnosis not present

## 2018-04-16 DIAGNOSIS — D508 Other iron deficiency anemias: Secondary | ICD-10-CM | POA: Diagnosis not present

## 2018-04-16 DIAGNOSIS — Z0000 Encounter for general adult medical examination without abnormal findings: Secondary | ICD-10-CM | POA: Diagnosis not present

## 2018-04-26 DIAGNOSIS — E785 Hyperlipidemia, unspecified: Secondary | ICD-10-CM | POA: Diagnosis not present

## 2018-04-26 DIAGNOSIS — E1142 Type 2 diabetes mellitus with diabetic polyneuropathy: Secondary | ICD-10-CM | POA: Diagnosis not present

## 2018-04-26 DIAGNOSIS — I5032 Chronic diastolic (congestive) heart failure: Secondary | ICD-10-CM | POA: Diagnosis not present

## 2018-04-26 DIAGNOSIS — N183 Chronic kidney disease, stage 3 (moderate): Secondary | ICD-10-CM | POA: Diagnosis not present

## 2018-04-26 DIAGNOSIS — I129 Hypertensive chronic kidney disease with stage 1 through stage 4 chronic kidney disease, or unspecified chronic kidney disease: Secondary | ICD-10-CM | POA: Diagnosis not present

## 2018-04-26 DIAGNOSIS — F339 Major depressive disorder, recurrent, unspecified: Secondary | ICD-10-CM | POA: Diagnosis not present

## 2018-04-26 DIAGNOSIS — R531 Weakness: Secondary | ICD-10-CM | POA: Diagnosis not present

## 2018-04-26 DIAGNOSIS — E1122 Type 2 diabetes mellitus with diabetic chronic kidney disease: Secondary | ICD-10-CM | POA: Diagnosis not present

## 2018-04-26 DIAGNOSIS — I130 Hypertensive heart and chronic kidney disease with heart failure and stage 1 through stage 4 chronic kidney disease, or unspecified chronic kidney disease: Secondary | ICD-10-CM | POA: Diagnosis not present

## 2018-04-30 ENCOUNTER — Non-Acute Institutional Stay: Payer: BLUE CROSS/BLUE SHIELD | Admitting: Nurse Practitioner

## 2018-04-30 DIAGNOSIS — R627 Adult failure to thrive: Secondary | ICD-10-CM | POA: Diagnosis not present

## 2018-04-30 DIAGNOSIS — Z515 Encounter for palliative care: Secondary | ICD-10-CM | POA: Diagnosis not present

## 2018-04-30 DIAGNOSIS — R269 Unspecified abnormalities of gait and mobility: Secondary | ICD-10-CM | POA: Diagnosis not present

## 2018-04-30 DIAGNOSIS — N183 Chronic kidney disease, stage 3 (moderate): Secondary | ICD-10-CM | POA: Diagnosis not present

## 2018-04-30 DIAGNOSIS — F0390 Unspecified dementia without behavioral disturbance: Secondary | ICD-10-CM | POA: Diagnosis not present

## 2018-05-02 ENCOUNTER — Encounter: Payer: Self-pay | Admitting: Nurse Practitioner

## 2018-05-02 NOTE — Progress Notes (Signed)
PALLIATIVE CARE CONSULT VISIT   PATIENT NAME: Lisa Villegas DOB: [birth year] MRN: [id number]  PRIMARY CARE PROVIDER:   [name], MD  REFERRING PROVIDER:  [name], [address] [place], [place] [address]  RESPONSIBLE PARTY:  [name] [phone]   HISTORY OF PRESENT ILLNESS:  Lisa Villegas is a 85 y.o. year old female with multiple medical problems including s/ CVA, dementia, gait disturbance, FTT/weight loss, HTN, HLD. Palliative Care was asked to assist with symptom management, continue ongoing patient and family support .    Collateral information by facility staff: patient with daytime sleepiness; appetite good weight stable; no behavioral issues    ASSESSMENT/PLAN/RECOMMENDATIONS:  Daytime sleepiness -unsure of nighttime sleep pattern -medications reviewed for offending agents -consider d/c of fesoterodine; patient is incontinent and this could possibly cause somnolence  -Dementia s/p CVA -gait disturbance -FTT/anemia -FAST 7A -continue asa and plavix -uses wheelchair -Weight stable -continue supplements/boost BID -MVI -ferrous sulfate   Chronic medical issues -HTN -HLD -Osteoporosis -GERD -overactive bladder -hydralazine and verapamil -atorvastatin -calcium and vitamin D -omeprazole -consider d/cing fesoterodine; patient continues to be incontinent; most likely 2/2 cognitive decline  ACP -DNR     I spent 15 minutes providing this consultation,  from 2:00 to 2:15. More than 50% of the time in this consultation was spent coordinating communication.    CODE STATUS: DNR  PPS: 40% HOSPICE ELIGIBILITY/DIAGNOSIS: TBD  PAST MEDICAL HISTORY:  Past Medical History:  Diagnosis Date  . Anemia   . Anxiety and depression    per med rec  . 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 Autism   . Chronic CHF (congestive heart failure) (HCC)    per med rec  . Chronic kidney disease (CKD)    stage 3 per chart in 09/2015  . Diabetes  mellitus without complication ([place])   . Diabetic neuropathy ([place])   . History of thyroid nodule    nontoxic goiter per med rec  . Hodgkin disease ([place])    in 8  . HTN (hypertension), benign   . Hyperlipidemia   . Incontinence   . Lack of sensation   . Osteopenia    per med rec  . Poor balance   . Stroke ([place])   . Tubular adenoma of colon 01/2017  . Vitamin D deficiency     SOCIAL HX:  Social History   Tobacco Use  . Smoking status: Never Smoker  . Smokeless tobacco: Never Used  Substance Use Topics  . Alcohol use: No    ALLERGIES:  Allergies  Allergen Reactions  . Penicillins Rash    On MAR: Has patient had a PCN reaction causing immediate rash, facial/tongue/throat swelling, SOB or lightheadedness with hypotension: Yes Has patient had a PCN reaction causing severe rash involving mucus membranes or skin necrosis: Unk Has patient had a PCN reaction that required hospitalization: Unk Has patient had a PCN reaction occurring within the last 10 years: Unk If all of the above answers are "NO", then may proceed with Cephalosporin use.      PERTINENT MEDICATIONS:  Outpatient Encounter Medications as of 04/30/2018  Medication Sig  . aspirin 81 MG tablet Take 1 tablet (81 mg total) by mouth daily.  Marland Kitchen atorvastatin (LIPITOR) 20 MG tablet Take 1 tablet (20 mg total) by mouth every evening.  . calcium carbonate (TUMS EX) 750 MG chewable tablet Chew 1 tablet by mouth daily.  . Cetylpyridinium Chloride (CREST PRO-HEALTH MT) by Mouth Rinse route 2 (two) times daily.   Marland Kitchen  Cholecalciferol (VITAMIN D3) 2000 UNITS TABS Take 1 tablet by mouth daily.   Marland Kitchen FeFum-FePoly-FA-B Cmp-C-Biot (INTEGRA PLUS PO) Take 1 capsule by mouth daily.  . ferrous sulfate 325 (65 FE) MG tablet Take 325 mg by mouth daily.   . fesoterodine (TOVIAZ) 8 MG TB24 tablet TAKE 1 TAB BY MOUTH EVERY DAY.  DO NOT CRUSH OR CHEW . (Patient taking differently: Take 8 mg by mouth daily. )  . hydrALAZINE (APRESOLINE) 10 MG  tablet Take 1 tablet (10 mg total) by mouth 3 (three) times daily.  . hydrALAZINE (APRESOLINE) 25 MG tablet Take 25 mg by mouth 3 (three) times daily as needed (for SBP 150 or greater and DBP 85 or greter).  Candace Gallus Care Products (BABY SHAMPOO) SHAM Use to cleanse eyes and eyelids at bedtime  . magnesium chloride (SLOW-MAG) 64 MG TBEC SR tablet Take 1 tablet by mouth 2 (two) times daily.   . metFORMIN (GLUCOPHAGE) 500 MG tablet Take 1,000 mg by mouth 2 (two) times daily with a meal.  . Multiple Vitamins-Minerals (CERTA-VITE PO) Take 1 tablet by mouth daily.   . Nutritional Supplements (GLUCERNA PO) Take 1 Can by mouth 2 (two) times daily.   Marland Kitchen omeprazole (PRILOSEC) 40 MG capsule Take 1 capsule (40 mg total) by mouth daily. (Patient taking differently: Take 20 mg by mouth daily. )  . Sodium Fluoride (PREVIDENT 5000 BOOSTER PLUS DT) Place 1 application onto teeth every evening.   Marland Kitchen UNABLE TO FIND Dove sensitive skin bar: use every morning to wash skin  . verapamil (CALAN-SR) 120 MG CR tablet Take 120 mg by mouth at bedtime.   No facility-administered encounter medications on file as of 04/30/2018.     PHYSICAL EXAM:   General: NAD, Lying in bed Cardiovascular: regular rate and rhythm Pulmonary: clear ant fields Abdomen: soft, nontender, + bowel sounds GU: no suprapubic tenderness Extremities: no edema, no joint deformities Skin: no rashes Neurological:  nonfocal ; patient would not answer simple questions  Stephanie G Martinique, NP

## 2018-05-10 ENCOUNTER — Ambulatory Visit: Payer: Medicare Other | Admitting: Podiatry

## 2018-05-16 ENCOUNTER — Ambulatory Visit (INDEPENDENT_AMBULATORY_CARE_PROVIDER_SITE_OTHER): Payer: Medicare Other | Admitting: Podiatry

## 2018-05-16 ENCOUNTER — Encounter: Payer: Self-pay | Admitting: Podiatry

## 2018-05-16 DIAGNOSIS — M79675 Pain in left toe(s): Secondary | ICD-10-CM

## 2018-05-16 DIAGNOSIS — M79674 Pain in right toe(s): Secondary | ICD-10-CM | POA: Diagnosis not present

## 2018-05-16 DIAGNOSIS — B351 Tinea unguium: Secondary | ICD-10-CM | POA: Diagnosis not present

## 2018-05-16 DIAGNOSIS — E1142 Type 2 diabetes mellitus with diabetic polyneuropathy: Secondary | ICD-10-CM

## 2018-05-16 DIAGNOSIS — M2142 Flat foot [pes planus] (acquired), left foot: Secondary | ICD-10-CM

## 2018-05-16 DIAGNOSIS — M2141 Flat foot [pes planus] (acquired), right foot: Secondary | ICD-10-CM

## 2018-05-16 NOTE — Progress Notes (Signed)
Complaint:  Visit Type: Patient returns to my office for continued preventative foot care services. Complaint: Patient states" my nails have grown long and thick and become painful to walk and wear shoes" Patient has been diagnosed with DM with neuropathy. The patient presents for preventative foot care services. No changes to ROS.  She presents to the office with her POA.  She is interested in new shoes.  Podiatric Exam: Vascular: dorsalis pedis and posterior tibial pulses are palpable bilateral. Capillary return is immediate. Temperature gradient is WNL. Skin turgor WNL  Sensorium: Normal Semmes Weinstein monofilament test. Normal tactile sensation bilaterally. Nail Exam: Pt has thick disfigured discolored nails with subungual debris noted bilateral entire nail hallux through fifth toenails Ulcer Exam: There is no evidence of ulcer or pre-ulcerative changes or infection. Orthopedic Exam: Muscle tone and strength are WNL. No limitations in general ROM. No crepitus or effusions noted. Foot type and digits show no abnormalities. Pes planus  B/L.  Exostosis Midfoot  B/L Skin: No Porokeratosis. No infection or ulcers  Diagnosis:  Onychomycosis, , Pain in right toe, pain in left toes  Treatment & Plan Procedures and Treatment: Consent by patient was obtained for treatment procedures.   Debridement of mycotic and hypertrophic toenails, 1 through 5 bilateral and clearing of subungual debris. No ulceration, no infection noted. Patient to direct paperwork for new shoes to my attention. Return Visit-Office Procedure: Patient instructed to return to the office for a follow up visit 3 months for continued evaluation and treatment.    Gardiner Barefoot DPM

## 2018-05-23 DIAGNOSIS — R2689 Other abnormalities of gait and mobility: Secondary | ICD-10-CM | POA: Diagnosis not present

## 2018-05-23 DIAGNOSIS — N183 Chronic kidney disease, stage 3 (moderate): Secondary | ICD-10-CM | POA: Diagnosis not present

## 2018-05-23 DIAGNOSIS — M6281 Muscle weakness (generalized): Secondary | ICD-10-CM | POA: Diagnosis not present

## 2018-05-23 DIAGNOSIS — R2681 Unsteadiness on feet: Secondary | ICD-10-CM | POA: Diagnosis not present

## 2018-05-23 DIAGNOSIS — E1165 Type 2 diabetes mellitus with hyperglycemia: Secondary | ICD-10-CM | POA: Diagnosis not present

## 2018-05-28 DIAGNOSIS — R2681 Unsteadiness on feet: Secondary | ICD-10-CM | POA: Diagnosis not present

## 2018-05-28 DIAGNOSIS — N183 Chronic kidney disease, stage 3 (moderate): Secondary | ICD-10-CM | POA: Diagnosis not present

## 2018-05-28 DIAGNOSIS — E1165 Type 2 diabetes mellitus with hyperglycemia: Secondary | ICD-10-CM | POA: Diagnosis not present

## 2018-05-28 DIAGNOSIS — M6281 Muscle weakness (generalized): Secondary | ICD-10-CM | POA: Diagnosis not present

## 2018-05-28 DIAGNOSIS — R2689 Other abnormalities of gait and mobility: Secondary | ICD-10-CM | POA: Diagnosis not present

## 2018-05-31 DIAGNOSIS — N183 Chronic kidney disease, stage 3 (moderate): Secondary | ICD-10-CM | POA: Diagnosis not present

## 2018-05-31 DIAGNOSIS — R2681 Unsteadiness on feet: Secondary | ICD-10-CM | POA: Diagnosis not present

## 2018-05-31 DIAGNOSIS — E1165 Type 2 diabetes mellitus with hyperglycemia: Secondary | ICD-10-CM | POA: Diagnosis not present

## 2018-05-31 DIAGNOSIS — R2689 Other abnormalities of gait and mobility: Secondary | ICD-10-CM | POA: Diagnosis not present

## 2018-05-31 DIAGNOSIS — M6281 Muscle weakness (generalized): Secondary | ICD-10-CM | POA: Diagnosis not present

## 2018-06-04 DIAGNOSIS — M6281 Muscle weakness (generalized): Secondary | ICD-10-CM | POA: Diagnosis not present

## 2018-06-04 DIAGNOSIS — E1165 Type 2 diabetes mellitus with hyperglycemia: Secondary | ICD-10-CM | POA: Diagnosis not present

## 2018-06-04 DIAGNOSIS — N183 Chronic kidney disease, stage 3 (moderate): Secondary | ICD-10-CM | POA: Diagnosis not present

## 2018-06-04 DIAGNOSIS — R2689 Other abnormalities of gait and mobility: Secondary | ICD-10-CM | POA: Diagnosis not present

## 2018-06-04 DIAGNOSIS — R2681 Unsteadiness on feet: Secondary | ICD-10-CM | POA: Diagnosis not present

## 2018-06-05 DIAGNOSIS — M6281 Muscle weakness (generalized): Secondary | ICD-10-CM | POA: Diagnosis not present

## 2018-06-05 DIAGNOSIS — N183 Chronic kidney disease, stage 3 (moderate): Secondary | ICD-10-CM | POA: Diagnosis not present

## 2018-06-05 DIAGNOSIS — E1165 Type 2 diabetes mellitus with hyperglycemia: Secondary | ICD-10-CM | POA: Diagnosis not present

## 2018-06-05 DIAGNOSIS — R2689 Other abnormalities of gait and mobility: Secondary | ICD-10-CM | POA: Diagnosis not present

## 2018-06-05 DIAGNOSIS — R2681 Unsteadiness on feet: Secondary | ICD-10-CM | POA: Diagnosis not present

## 2018-06-07 DIAGNOSIS — R2681 Unsteadiness on feet: Secondary | ICD-10-CM | POA: Diagnosis not present

## 2018-06-07 DIAGNOSIS — N183 Chronic kidney disease, stage 3 (moderate): Secondary | ICD-10-CM | POA: Diagnosis not present

## 2018-06-07 DIAGNOSIS — R2689 Other abnormalities of gait and mobility: Secondary | ICD-10-CM | POA: Diagnosis not present

## 2018-06-07 DIAGNOSIS — M6281 Muscle weakness (generalized): Secondary | ICD-10-CM | POA: Diagnosis not present

## 2018-06-07 DIAGNOSIS — E1165 Type 2 diabetes mellitus with hyperglycemia: Secondary | ICD-10-CM | POA: Diagnosis not present

## 2018-06-08 DIAGNOSIS — R2689 Other abnormalities of gait and mobility: Secondary | ICD-10-CM | POA: Diagnosis not present

## 2018-06-08 DIAGNOSIS — N183 Chronic kidney disease, stage 3 (moderate): Secondary | ICD-10-CM | POA: Diagnosis not present

## 2018-06-08 DIAGNOSIS — E1165 Type 2 diabetes mellitus with hyperglycemia: Secondary | ICD-10-CM | POA: Diagnosis not present

## 2018-06-08 DIAGNOSIS — R2681 Unsteadiness on feet: Secondary | ICD-10-CM | POA: Diagnosis not present

## 2018-06-08 DIAGNOSIS — M6281 Muscle weakness (generalized): Secondary | ICD-10-CM | POA: Diagnosis not present

## 2018-06-12 DIAGNOSIS — E1165 Type 2 diabetes mellitus with hyperglycemia: Secondary | ICD-10-CM | POA: Diagnosis not present

## 2018-06-12 DIAGNOSIS — M6281 Muscle weakness (generalized): Secondary | ICD-10-CM | POA: Diagnosis not present

## 2018-06-12 DIAGNOSIS — R2681 Unsteadiness on feet: Secondary | ICD-10-CM | POA: Diagnosis not present

## 2018-06-12 DIAGNOSIS — N183 Chronic kidney disease, stage 3 (moderate): Secondary | ICD-10-CM | POA: Diagnosis not present

## 2018-06-12 DIAGNOSIS — R2689 Other abnormalities of gait and mobility: Secondary | ICD-10-CM | POA: Diagnosis not present

## 2018-06-20 DIAGNOSIS — M6281 Muscle weakness (generalized): Secondary | ICD-10-CM | POA: Diagnosis not present

## 2018-06-20 DIAGNOSIS — N183 Chronic kidney disease, stage 3 (moderate): Secondary | ICD-10-CM | POA: Diagnosis not present

## 2018-06-20 DIAGNOSIS — E1165 Type 2 diabetes mellitus with hyperglycemia: Secondary | ICD-10-CM | POA: Diagnosis not present

## 2018-06-20 DIAGNOSIS — R2681 Unsteadiness on feet: Secondary | ICD-10-CM | POA: Diagnosis not present

## 2018-06-20 DIAGNOSIS — R2689 Other abnormalities of gait and mobility: Secondary | ICD-10-CM | POA: Diagnosis not present

## 2018-06-25 DIAGNOSIS — E1165 Type 2 diabetes mellitus with hyperglycemia: Secondary | ICD-10-CM | POA: Diagnosis not present

## 2018-06-25 DIAGNOSIS — N183 Chronic kidney disease, stage 3 (moderate): Secondary | ICD-10-CM | POA: Diagnosis not present

## 2018-06-25 DIAGNOSIS — R2681 Unsteadiness on feet: Secondary | ICD-10-CM | POA: Diagnosis not present

## 2018-06-25 DIAGNOSIS — R2689 Other abnormalities of gait and mobility: Secondary | ICD-10-CM | POA: Diagnosis not present

## 2018-06-25 DIAGNOSIS — M6281 Muscle weakness (generalized): Secondary | ICD-10-CM | POA: Diagnosis not present

## 2018-06-27 DIAGNOSIS — M6281 Muscle weakness (generalized): Secondary | ICD-10-CM | POA: Diagnosis not present

## 2018-06-27 DIAGNOSIS — R2681 Unsteadiness on feet: Secondary | ICD-10-CM | POA: Diagnosis not present

## 2018-06-27 DIAGNOSIS — N183 Chronic kidney disease, stage 3 (moderate): Secondary | ICD-10-CM | POA: Diagnosis not present

## 2018-06-27 DIAGNOSIS — E1165 Type 2 diabetes mellitus with hyperglycemia: Secondary | ICD-10-CM | POA: Diagnosis not present

## 2018-06-28 DIAGNOSIS — M6281 Muscle weakness (generalized): Secondary | ICD-10-CM | POA: Diagnosis not present

## 2018-06-28 DIAGNOSIS — E1165 Type 2 diabetes mellitus with hyperglycemia: Secondary | ICD-10-CM | POA: Diagnosis not present

## 2018-06-28 DIAGNOSIS — R2681 Unsteadiness on feet: Secondary | ICD-10-CM | POA: Diagnosis not present

## 2018-06-28 DIAGNOSIS — N183 Chronic kidney disease, stage 3 (moderate): Secondary | ICD-10-CM | POA: Diagnosis not present

## 2018-07-02 DIAGNOSIS — M6281 Muscle weakness (generalized): Secondary | ICD-10-CM | POA: Diagnosis not present

## 2018-07-02 DIAGNOSIS — R2681 Unsteadiness on feet: Secondary | ICD-10-CM | POA: Diagnosis not present

## 2018-07-02 DIAGNOSIS — N183 Chronic kidney disease, stage 3 (moderate): Secondary | ICD-10-CM | POA: Diagnosis not present

## 2018-07-02 DIAGNOSIS — E1165 Type 2 diabetes mellitus with hyperglycemia: Secondary | ICD-10-CM | POA: Diagnosis not present

## 2018-07-04 DIAGNOSIS — R2681 Unsteadiness on feet: Secondary | ICD-10-CM | POA: Diagnosis not present

## 2018-07-04 DIAGNOSIS — N183 Chronic kidney disease, stage 3 (moderate): Secondary | ICD-10-CM | POA: Diagnosis not present

## 2018-07-04 DIAGNOSIS — E1165 Type 2 diabetes mellitus with hyperglycemia: Secondary | ICD-10-CM | POA: Diagnosis not present

## 2018-07-04 DIAGNOSIS — M6281 Muscle weakness (generalized): Secondary | ICD-10-CM | POA: Diagnosis not present

## 2018-07-05 DIAGNOSIS — M6281 Muscle weakness (generalized): Secondary | ICD-10-CM | POA: Diagnosis not present

## 2018-07-05 DIAGNOSIS — E1165 Type 2 diabetes mellitus with hyperglycemia: Secondary | ICD-10-CM | POA: Diagnosis not present

## 2018-07-05 DIAGNOSIS — R2681 Unsteadiness on feet: Secondary | ICD-10-CM | POA: Diagnosis not present

## 2018-07-05 DIAGNOSIS — N183 Chronic kidney disease, stage 3 (moderate): Secondary | ICD-10-CM | POA: Diagnosis not present

## 2018-07-09 DIAGNOSIS — E1165 Type 2 diabetes mellitus with hyperglycemia: Secondary | ICD-10-CM | POA: Diagnosis not present

## 2018-07-09 DIAGNOSIS — M6281 Muscle weakness (generalized): Secondary | ICD-10-CM | POA: Diagnosis not present

## 2018-07-09 DIAGNOSIS — N183 Chronic kidney disease, stage 3 (moderate): Secondary | ICD-10-CM | POA: Diagnosis not present

## 2018-07-09 DIAGNOSIS — R2681 Unsteadiness on feet: Secondary | ICD-10-CM | POA: Diagnosis not present

## 2018-07-12 DIAGNOSIS — M6281 Muscle weakness (generalized): Secondary | ICD-10-CM | POA: Diagnosis not present

## 2018-07-12 DIAGNOSIS — N183 Chronic kidney disease, stage 3 (moderate): Secondary | ICD-10-CM | POA: Diagnosis not present

## 2018-07-12 DIAGNOSIS — R2681 Unsteadiness on feet: Secondary | ICD-10-CM | POA: Diagnosis not present

## 2018-07-12 DIAGNOSIS — E1165 Type 2 diabetes mellitus with hyperglycemia: Secondary | ICD-10-CM | POA: Diagnosis not present

## 2018-07-16 DIAGNOSIS — M6281 Muscle weakness (generalized): Secondary | ICD-10-CM | POA: Diagnosis not present

## 2018-07-16 DIAGNOSIS — E1165 Type 2 diabetes mellitus with hyperglycemia: Secondary | ICD-10-CM | POA: Diagnosis not present

## 2018-07-16 DIAGNOSIS — N183 Chronic kidney disease, stage 3 (moderate): Secondary | ICD-10-CM | POA: Diagnosis not present

## 2018-07-16 DIAGNOSIS — R2681 Unsteadiness on feet: Secondary | ICD-10-CM | POA: Diagnosis not present

## 2018-07-17 DIAGNOSIS — E1165 Type 2 diabetes mellitus with hyperglycemia: Secondary | ICD-10-CM | POA: Diagnosis not present

## 2018-07-17 DIAGNOSIS — M6281 Muscle weakness (generalized): Secondary | ICD-10-CM | POA: Diagnosis not present

## 2018-07-17 DIAGNOSIS — R2681 Unsteadiness on feet: Secondary | ICD-10-CM | POA: Diagnosis not present

## 2018-07-17 DIAGNOSIS — N183 Chronic kidney disease, stage 3 (moderate): Secondary | ICD-10-CM | POA: Diagnosis not present

## 2018-07-19 DIAGNOSIS — N183 Chronic kidney disease, stage 3 (moderate): Secondary | ICD-10-CM | POA: Diagnosis not present

## 2018-07-19 DIAGNOSIS — R2681 Unsteadiness on feet: Secondary | ICD-10-CM | POA: Diagnosis not present

## 2018-07-19 DIAGNOSIS — M6281 Muscle weakness (generalized): Secondary | ICD-10-CM | POA: Diagnosis not present

## 2018-07-19 DIAGNOSIS — E1165 Type 2 diabetes mellitus with hyperglycemia: Secondary | ICD-10-CM | POA: Diagnosis not present

## 2018-07-23 DIAGNOSIS — R2681 Unsteadiness on feet: Secondary | ICD-10-CM | POA: Diagnosis not present

## 2018-07-23 DIAGNOSIS — M6281 Muscle weakness (generalized): Secondary | ICD-10-CM | POA: Diagnosis not present

## 2018-07-23 DIAGNOSIS — E1165 Type 2 diabetes mellitus with hyperglycemia: Secondary | ICD-10-CM | POA: Diagnosis not present

## 2018-07-23 DIAGNOSIS — N183 Chronic kidney disease, stage 3 (moderate): Secondary | ICD-10-CM | POA: Diagnosis not present

## 2018-07-24 DIAGNOSIS — M6281 Muscle weakness (generalized): Secondary | ICD-10-CM | POA: Diagnosis not present

## 2018-07-24 DIAGNOSIS — E1165 Type 2 diabetes mellitus with hyperglycemia: Secondary | ICD-10-CM | POA: Diagnosis not present

## 2018-07-24 DIAGNOSIS — N183 Chronic kidney disease, stage 3 (moderate): Secondary | ICD-10-CM | POA: Diagnosis not present

## 2018-07-24 DIAGNOSIS — R2681 Unsteadiness on feet: Secondary | ICD-10-CM | POA: Diagnosis not present

## 2018-07-26 DIAGNOSIS — M6281 Muscle weakness (generalized): Secondary | ICD-10-CM | POA: Diagnosis not present

## 2018-07-26 DIAGNOSIS — N183 Chronic kidney disease, stage 3 (moderate): Secondary | ICD-10-CM | POA: Diagnosis not present

## 2018-07-26 DIAGNOSIS — E1165 Type 2 diabetes mellitus with hyperglycemia: Secondary | ICD-10-CM | POA: Diagnosis not present

## 2018-07-26 DIAGNOSIS — R2681 Unsteadiness on feet: Secondary | ICD-10-CM | POA: Diagnosis not present

## 2018-07-30 DIAGNOSIS — N183 Chronic kidney disease, stage 3 (moderate): Secondary | ICD-10-CM | POA: Diagnosis not present

## 2018-07-30 DIAGNOSIS — R2689 Other abnormalities of gait and mobility: Secondary | ICD-10-CM | POA: Diagnosis not present

## 2018-07-30 DIAGNOSIS — R2681 Unsteadiness on feet: Secondary | ICD-10-CM | POA: Diagnosis not present

## 2018-07-30 DIAGNOSIS — E1165 Type 2 diabetes mellitus with hyperglycemia: Secondary | ICD-10-CM | POA: Diagnosis not present

## 2018-07-30 DIAGNOSIS — M6281 Muscle weakness (generalized): Secondary | ICD-10-CM | POA: Diagnosis not present

## 2018-07-31 DIAGNOSIS — R2689 Other abnormalities of gait and mobility: Secondary | ICD-10-CM | POA: Diagnosis not present

## 2018-07-31 DIAGNOSIS — M6281 Muscle weakness (generalized): Secondary | ICD-10-CM | POA: Diagnosis not present

## 2018-07-31 DIAGNOSIS — E1165 Type 2 diabetes mellitus with hyperglycemia: Secondary | ICD-10-CM | POA: Diagnosis not present

## 2018-07-31 DIAGNOSIS — R2681 Unsteadiness on feet: Secondary | ICD-10-CM | POA: Diagnosis not present

## 2018-07-31 DIAGNOSIS — N183 Chronic kidney disease, stage 3 (moderate): Secondary | ICD-10-CM | POA: Diagnosis not present

## 2018-08-02 DIAGNOSIS — N183 Chronic kidney disease, stage 3 (moderate): Secondary | ICD-10-CM | POA: Diagnosis not present

## 2018-08-02 DIAGNOSIS — M6281 Muscle weakness (generalized): Secondary | ICD-10-CM | POA: Diagnosis not present

## 2018-08-02 DIAGNOSIS — R2689 Other abnormalities of gait and mobility: Secondary | ICD-10-CM | POA: Diagnosis not present

## 2018-08-02 DIAGNOSIS — R2681 Unsteadiness on feet: Secondary | ICD-10-CM | POA: Diagnosis not present

## 2018-08-02 DIAGNOSIS — E1165 Type 2 diabetes mellitus with hyperglycemia: Secondary | ICD-10-CM | POA: Diagnosis not present

## 2018-08-03 DIAGNOSIS — M6281 Muscle weakness (generalized): Secondary | ICD-10-CM | POA: Diagnosis not present

## 2018-08-03 DIAGNOSIS — R2689 Other abnormalities of gait and mobility: Secondary | ICD-10-CM | POA: Diagnosis not present

## 2018-08-03 DIAGNOSIS — N183 Chronic kidney disease, stage 3 (moderate): Secondary | ICD-10-CM | POA: Diagnosis not present

## 2018-08-03 DIAGNOSIS — E1165 Type 2 diabetes mellitus with hyperglycemia: Secondary | ICD-10-CM | POA: Diagnosis not present

## 2018-08-03 DIAGNOSIS — R2681 Unsteadiness on feet: Secondary | ICD-10-CM | POA: Diagnosis not present

## 2018-08-07 DIAGNOSIS — N183 Chronic kidney disease, stage 3 (moderate): Secondary | ICD-10-CM | POA: Diagnosis not present

## 2018-08-07 DIAGNOSIS — R2681 Unsteadiness on feet: Secondary | ICD-10-CM | POA: Diagnosis not present

## 2018-08-07 DIAGNOSIS — R2689 Other abnormalities of gait and mobility: Secondary | ICD-10-CM | POA: Diagnosis not present

## 2018-08-07 DIAGNOSIS — E1165 Type 2 diabetes mellitus with hyperglycemia: Secondary | ICD-10-CM | POA: Diagnosis not present

## 2018-08-07 DIAGNOSIS — M6281 Muscle weakness (generalized): Secondary | ICD-10-CM | POA: Diagnosis not present

## 2018-08-09 DIAGNOSIS — N183 Chronic kidney disease, stage 3 (moderate): Secondary | ICD-10-CM | POA: Diagnosis not present

## 2018-08-09 DIAGNOSIS — R2689 Other abnormalities of gait and mobility: Secondary | ICD-10-CM | POA: Diagnosis not present

## 2018-08-09 DIAGNOSIS — R2681 Unsteadiness on feet: Secondary | ICD-10-CM | POA: Diagnosis not present

## 2018-08-09 DIAGNOSIS — M6281 Muscle weakness (generalized): Secondary | ICD-10-CM | POA: Diagnosis not present

## 2018-08-09 DIAGNOSIS — E1165 Type 2 diabetes mellitus with hyperglycemia: Secondary | ICD-10-CM | POA: Diagnosis not present

## 2018-08-13 DIAGNOSIS — M6281 Muscle weakness (generalized): Secondary | ICD-10-CM | POA: Diagnosis not present

## 2018-08-13 DIAGNOSIS — R2689 Other abnormalities of gait and mobility: Secondary | ICD-10-CM | POA: Diagnosis not present

## 2018-08-13 DIAGNOSIS — E1165 Type 2 diabetes mellitus with hyperglycemia: Secondary | ICD-10-CM | POA: Diagnosis not present

## 2018-08-13 DIAGNOSIS — N183 Chronic kidney disease, stage 3 (moderate): Secondary | ICD-10-CM | POA: Diagnosis not present

## 2018-08-13 DIAGNOSIS — R2681 Unsteadiness on feet: Secondary | ICD-10-CM | POA: Diagnosis not present

## 2018-08-14 DIAGNOSIS — E1165 Type 2 diabetes mellitus with hyperglycemia: Secondary | ICD-10-CM | POA: Diagnosis not present

## 2018-08-14 DIAGNOSIS — M6281 Muscle weakness (generalized): Secondary | ICD-10-CM | POA: Diagnosis not present

## 2018-08-14 DIAGNOSIS — R2681 Unsteadiness on feet: Secondary | ICD-10-CM | POA: Diagnosis not present

## 2018-08-14 DIAGNOSIS — N183 Chronic kidney disease, stage 3 (moderate): Secondary | ICD-10-CM | POA: Diagnosis not present

## 2018-08-14 DIAGNOSIS — R2689 Other abnormalities of gait and mobility: Secondary | ICD-10-CM | POA: Diagnosis not present

## 2018-08-15 ENCOUNTER — Ambulatory Visit: Payer: Medicare Other | Admitting: Podiatry

## 2018-08-16 DIAGNOSIS — M6281 Muscle weakness (generalized): Secondary | ICD-10-CM | POA: Diagnosis not present

## 2018-08-16 DIAGNOSIS — R2681 Unsteadiness on feet: Secondary | ICD-10-CM | POA: Diagnosis not present

## 2018-08-16 DIAGNOSIS — N183 Chronic kidney disease, stage 3 (moderate): Secondary | ICD-10-CM | POA: Diagnosis not present

## 2018-08-16 DIAGNOSIS — R2689 Other abnormalities of gait and mobility: Secondary | ICD-10-CM | POA: Diagnosis not present

## 2018-08-16 DIAGNOSIS — E1165 Type 2 diabetes mellitus with hyperglycemia: Secondary | ICD-10-CM | POA: Diagnosis not present

## 2018-08-26 DIAGNOSIS — R2689 Other abnormalities of gait and mobility: Secondary | ICD-10-CM | POA: Diagnosis not present

## 2018-08-26 DIAGNOSIS — M6281 Muscle weakness (generalized): Secondary | ICD-10-CM | POA: Diagnosis not present

## 2018-08-26 DIAGNOSIS — N183 Chronic kidney disease, stage 3 (moderate): Secondary | ICD-10-CM | POA: Diagnosis not present

## 2018-08-26 DIAGNOSIS — E1165 Type 2 diabetes mellitus with hyperglycemia: Secondary | ICD-10-CM | POA: Diagnosis not present

## 2018-08-26 DIAGNOSIS — R2681 Unsteadiness on feet: Secondary | ICD-10-CM | POA: Diagnosis not present

## 2018-08-27 DIAGNOSIS — R2689 Other abnormalities of gait and mobility: Secondary | ICD-10-CM | POA: Diagnosis not present

## 2018-08-27 DIAGNOSIS — R2681 Unsteadiness on feet: Secondary | ICD-10-CM | POA: Diagnosis not present

## 2018-08-27 DIAGNOSIS — M6281 Muscle weakness (generalized): Secondary | ICD-10-CM | POA: Diagnosis not present

## 2018-08-27 DIAGNOSIS — E1165 Type 2 diabetes mellitus with hyperglycemia: Secondary | ICD-10-CM | POA: Diagnosis not present

## 2018-08-27 DIAGNOSIS — N183 Chronic kidney disease, stage 3 (moderate): Secondary | ICD-10-CM | POA: Diagnosis not present

## 2018-08-29 DIAGNOSIS — R2689 Other abnormalities of gait and mobility: Secondary | ICD-10-CM | POA: Diagnosis not present

## 2018-08-29 DIAGNOSIS — N183 Chronic kidney disease, stage 3 (moderate): Secondary | ICD-10-CM | POA: Diagnosis not present

## 2018-08-29 DIAGNOSIS — E1165 Type 2 diabetes mellitus with hyperglycemia: Secondary | ICD-10-CM | POA: Diagnosis not present

## 2018-08-29 DIAGNOSIS — M6281 Muscle weakness (generalized): Secondary | ICD-10-CM | POA: Diagnosis not present

## 2018-08-29 DIAGNOSIS — R2681 Unsteadiness on feet: Secondary | ICD-10-CM | POA: Diagnosis not present

## 2018-08-31 DIAGNOSIS — N183 Chronic kidney disease, stage 3 (moderate): Secondary | ICD-10-CM | POA: Diagnosis not present

## 2018-08-31 DIAGNOSIS — M6281 Muscle weakness (generalized): Secondary | ICD-10-CM | POA: Diagnosis not present

## 2018-08-31 DIAGNOSIS — R2689 Other abnormalities of gait and mobility: Secondary | ICD-10-CM | POA: Diagnosis not present

## 2018-08-31 DIAGNOSIS — E1165 Type 2 diabetes mellitus with hyperglycemia: Secondary | ICD-10-CM | POA: Diagnosis not present

## 2018-08-31 DIAGNOSIS — R2681 Unsteadiness on feet: Secondary | ICD-10-CM | POA: Diagnosis not present

## 2018-09-04 DIAGNOSIS — R2689 Other abnormalities of gait and mobility: Secondary | ICD-10-CM | POA: Diagnosis not present

## 2018-09-04 DIAGNOSIS — M6281 Muscle weakness (generalized): Secondary | ICD-10-CM | POA: Diagnosis not present

## 2018-09-04 DIAGNOSIS — E1165 Type 2 diabetes mellitus with hyperglycemia: Secondary | ICD-10-CM | POA: Diagnosis not present

## 2018-09-04 DIAGNOSIS — N183 Chronic kidney disease, stage 3 (moderate): Secondary | ICD-10-CM | POA: Diagnosis not present

## 2018-09-04 DIAGNOSIS — R2681 Unsteadiness on feet: Secondary | ICD-10-CM | POA: Diagnosis not present

## 2018-09-05 DIAGNOSIS — E1165 Type 2 diabetes mellitus with hyperglycemia: Secondary | ICD-10-CM | POA: Diagnosis not present

## 2018-09-05 DIAGNOSIS — R2689 Other abnormalities of gait and mobility: Secondary | ICD-10-CM | POA: Diagnosis not present

## 2018-09-05 DIAGNOSIS — N183 Chronic kidney disease, stage 3 (moderate): Secondary | ICD-10-CM | POA: Diagnosis not present

## 2018-09-05 DIAGNOSIS — M6281 Muscle weakness (generalized): Secondary | ICD-10-CM | POA: Diagnosis not present

## 2018-09-05 DIAGNOSIS — R2681 Unsteadiness on feet: Secondary | ICD-10-CM | POA: Diagnosis not present

## 2018-09-11 DIAGNOSIS — E1165 Type 2 diabetes mellitus with hyperglycemia: Secondary | ICD-10-CM | POA: Diagnosis not present

## 2018-09-11 DIAGNOSIS — M6281 Muscle weakness (generalized): Secondary | ICD-10-CM | POA: Diagnosis not present

## 2018-09-11 DIAGNOSIS — R2689 Other abnormalities of gait and mobility: Secondary | ICD-10-CM | POA: Diagnosis not present

## 2018-09-11 DIAGNOSIS — N183 Chronic kidney disease, stage 3 (moderate): Secondary | ICD-10-CM | POA: Diagnosis not present

## 2018-09-11 DIAGNOSIS — R2681 Unsteadiness on feet: Secondary | ICD-10-CM | POA: Diagnosis not present

## 2018-10-10 ENCOUNTER — Other Ambulatory Visit: Payer: Self-pay

## 2018-10-10 ENCOUNTER — Non-Acute Institutional Stay: Payer: Medicare Other | Admitting: Adult Health Nurse Practitioner

## 2018-10-10 DIAGNOSIS — Z515 Encounter for palliative care: Secondary | ICD-10-CM | POA: Diagnosis not present

## 2018-10-10 NOTE — Progress Notes (Signed)
[facility] Consult Note Telephone: [phone]  Fax: [phone]  PATIENT NAME: Lisa Villegas DOB: [birth year] MRN: [id number]  PRIMARY CARE PROVIDER:   [name], MD  REFERRING PROVIDER:  [name], [address] [place],  [place] [address]  RESPONSIBLE PARTY:    [name] [phone]    Due to the COVID-19 crisis, this visit was done via telemedicine and it was initiated and consent by this patient and or family.  Visit was via Duo on smart phones assisted by [facility] of nursing at facility, Elliot Gault.  RECOMMENDATIONS and PLAN:  1. Dementia.  FAST 7a. Patient is incontinent of B&B.  Patient mainly stays in bed but can sit up in a wheelchair.  Requires assistance with ADLs.  Able to feed herself.  Patient has not had any weight changes or changes in appetite. No concerns reported by staff today.    2.  Goals of care.  Patient is a DNR.   No reported falls, infections, hospitalizations since last visit.  Patient stable. Continue supportive care.  I spent 20 minutes providing this consultation,  from 11:40 to 12:00. More than 50% of the time in this consultation was spent coordinating communication.   HISTORY OF PRESENT ILLNESS:  Lisa Villegas is a 85 y.o. year old female with multiple medical problems including s/ CVA, dementia, gait disturbance, FTT/weight loss, HTN, HLD. Palliative Care was asked to help address goals of care.   CODE STATUS: DNR  PPS: 40% HOSPICE ELIGIBILITY/DIAGNOSIS: TBD  PHYSICAL EXAM:   General: NAD, Lying in bed Neurological:  Patient would not talk with provider today  PAST MEDICAL HISTORY:  Past Medical History:  Diagnosis Date  . Anemia   . Anxiety and depression    per med rec  . 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 Autism   . Chronic CHF (congestive heart failure) (HCC)    per med rec  . Chronic kidney disease (CKD)    stage 3 per chart in 09/2015  .  Diabetes mellitus without complication ([facility])   . Diabetic neuropathy ([place])   . History of thyroid nodule    nontoxic goiter per med rec  . Hodgkin disease ([place])    in 65  . HTN (hypertension), benign   . Hyperlipidemia   . Incontinence   . Lack of sensation   . Osteopenia    per med rec  . Poor balance   . Stroke ([place])   . Tubular adenoma of colon 01/2017  . Vitamin D deficiency     SOCIAL HX:  Social History   Tobacco Use  . Smoking status: Never Smoker  . Smokeless tobacco: Never Used  Substance Use Topics  . Alcohol use: No    ALLERGIES:  Allergies  Allergen Reactions  . Penicillins Rash    On MAR: Has patient had a PCN reaction causing immediate rash, facial/tongue/throat swelling, SOB or lightheadedness with hypotension: Yes Has patient had a PCN reaction causing severe rash involving mucus membranes or skin necrosis: Unk Has patient had a PCN reaction that required hospitalization: Unk Has patient had a PCN reaction occurring within the last 10 years: Unk If all of the above answers are "NO", then may proceed with Cephalosporin use.      PERTINENT MEDICATIONS:  Outpatient Encounter Medications as of 10/10/2018  Medication Sig  . aspirin 81 MG tablet Take 1 tablet (81 mg total) by mouth daily.  Marland Kitchen atorvastatin (LIPITOR) 20 MG tablet  Take 1 tablet (20 mg total) by mouth every evening.  . calcium carbonate (TUMS EX) 750 MG chewable tablet Chew 1 tablet by mouth daily.  . Cetylpyridinium Chloride (CREST PRO-HEALTH MT) by Mouth Rinse route 2 (two) times daily.   . Cholecalciferol (VITAMIN D3) 2000 UNITS TABS Take 1 tablet by mouth daily.   Marland Kitchen FeFum-FePoly-FA-B Cmp-C-Biot (INTEGRA PLUS PO) Take 1 capsule by mouth daily.  . ferrous sulfate 325 (65 FE) MG tablet Take 325 mg by mouth daily.   . fesoterodine (TOVIAZ) 8 MG TB24 tablet TAKE 1 TAB BY MOUTH EVERY DAY.  DO NOT CRUSH OR CHEW . (Patient taking differently: Take 8 mg by mouth daily. )  . hydrALAZINE (APRESOLINE)  10 MG tablet Take 1 tablet (10 mg total) by mouth 3 (three) times daily.  . hydrALAZINE (APRESOLINE) 25 MG tablet Take 25 mg by mouth 3 (three) times daily as needed (for SBP 150 or greater and DBP 85 or greter).  Candace Gallus Care Products (BABY SHAMPOO) SHAM Use to cleanse eyes and eyelids at bedtime  . magnesium chloride (SLOW-MAG) 64 MG TBEC SR tablet Take 1 tablet by mouth 2 (two) times daily.   . metFORMIN (GLUCOPHAGE) 500 MG tablet Take 1,000 mg by mouth 2 (two) times daily with a meal.  . Multiple Vitamins-Minerals (CERTA-VITE PO) Take 1 tablet by mouth daily.   . Nutritional Supplements (GLUCERNA PO) Take 1 Can by mouth 2 (two) times daily.   Marland Kitchen omeprazole (PRILOSEC) 20 MG capsule   . Sodium Fluoride (PREVIDENT 5000 BOOSTER PLUS DT) Place 1 application onto teeth every evening.   Marland Kitchen UNABLE TO FIND Dove sensitive skin bar: use every morning to wash skin  . verapamil (CALAN-SR) 120 MG CR tablet Take 120 mg by mouth at bedtime.   No facility-administered encounter medications on file as of 10/10/2018.       Lianna Sitzmann Jenetta Downer, NP

## 2018-11-07 ENCOUNTER — Encounter: Payer: Self-pay | Admitting: Podiatry

## 2018-11-07 ENCOUNTER — Ambulatory Visit (INDEPENDENT_AMBULATORY_CARE_PROVIDER_SITE_OTHER): Payer: Medicare Other | Admitting: Podiatry

## 2018-11-07 ENCOUNTER — Other Ambulatory Visit: Payer: Self-pay

## 2018-11-07 VITALS — Temp 97.2°F

## 2018-11-07 DIAGNOSIS — M79674 Pain in right toe(s): Secondary | ICD-10-CM

## 2018-11-07 DIAGNOSIS — M79675 Pain in left toe(s): Secondary | ICD-10-CM

## 2018-11-07 DIAGNOSIS — B351 Tinea unguium: Secondary | ICD-10-CM | POA: Diagnosis not present

## 2018-11-07 DIAGNOSIS — E1142 Type 2 diabetes mellitus with diabetic polyneuropathy: Secondary | ICD-10-CM

## 2018-11-07 NOTE — Progress Notes (Signed)
Complaint:  Visit Type: Patient returns to my office for continued preventative foot care services. Complaint: Patient states" my nails have grown long and thick and become painful to walk and wear shoes" Patient has been diagnosed with DM with neuropathy. The patient presents for preventative foot care services. No changes to ROS.  She presents to the office with her POA.  She is interested in new shoes.  Podiatric Exam: Vascular: dorsalis pedis and posterior tibial pulses are palpable bilateral. Capillary return is immediate. Temperature gradient is WNL. Skin turgor WNL  Sensorium: Normal Semmes Weinstein monofilament test. Normal tactile sensation bilaterally. Nail Exam: Pt has thick disfigured discolored nails with subungual debris noted bilateral entire nail hallux through fifth toenails Ulcer Exam: There is no evidence of ulcer or pre-ulcerative changes or infection. Orthopedic Exam: Muscle tone and strength are WNL. No limitations in general ROM. No crepitus or effusions noted. Foot type and digits show no abnormalities. Pes planus  B/L.  Exostosis Midfoot  B/L Skin: No Porokeratosis. No infection or ulcers  Diagnosis:  Onychomycosis, , Pain in right toe, pain in left toes  Treatment & Plan Procedures and Treatment: Consent by patient was obtained for treatment procedures.   Debridement of mycotic and hypertrophic toenails, 1 through 5 bilateral and clearing of subungual debris. No ulceration, no infection noted.  Return Visit-Office Procedure: Patient instructed to return to the office for a follow up visit 3 months for continued evaluation and treatment.    Gardiner Barefoot DPM

## 2018-11-16 ENCOUNTER — Telehealth: Payer: Self-pay | Admitting: Podiatry

## 2018-11-16 NOTE — Telephone Encounter (Signed)
Called and spoke to pts caregiver [name]) and she stated pt needs custom shoes and the only place she has located that does that is a place in [place].  I explained that our office can make a mold for pt and send it out for custom shoes. She is interested in coming in for that and I have scheduled pt to see Rick 9.3.2020.

## 2018-11-29 ENCOUNTER — Other Ambulatory Visit: Payer: Medicare Other | Admitting: Orthotics

## 2018-12-05 DIAGNOSIS — E785 Hyperlipidemia, unspecified: Secondary | ICD-10-CM | POA: Diagnosis not present

## 2018-12-05 DIAGNOSIS — I129 Hypertensive chronic kidney disease with stage 1 through stage 4 chronic kidney disease, or unspecified chronic kidney disease: Secondary | ICD-10-CM | POA: Diagnosis not present

## 2018-12-05 DIAGNOSIS — E1122 Type 2 diabetes mellitus with diabetic chronic kidney disease: Secondary | ICD-10-CM | POA: Diagnosis not present

## 2018-12-05 DIAGNOSIS — I130 Hypertensive heart and chronic kidney disease with heart failure and stage 1 through stage 4 chronic kidney disease, or unspecified chronic kidney disease: Secondary | ICD-10-CM | POA: Diagnosis not present

## 2018-12-10 ENCOUNTER — Other Ambulatory Visit: Payer: Medicare Other | Admitting: Orthotics

## 2018-12-14 ENCOUNTER — Other Ambulatory Visit: Payer: Self-pay

## 2018-12-14 ENCOUNTER — Emergency Department (HOSPITAL_COMMUNITY): Payer: Medicare Other

## 2018-12-14 ENCOUNTER — Inpatient Hospital Stay (HOSPITAL_COMMUNITY)
Admission: EM | Admit: 2018-12-14 | Discharge: 2018-12-17 | DRG: 872 | Disposition: A | Discharge status: Skilled Nursing Facility | Payer: Medicare Other | Source: Skilled Nursing Facility | Attending: Internal Medicine | Admitting: Internal Medicine

## 2018-12-14 DIAGNOSIS — S199XXA Unspecified injury of neck, initial encounter: Secondary | ICD-10-CM | POA: Diagnosis not present

## 2018-12-14 DIAGNOSIS — R531 Weakness: Secondary | ICD-10-CM | POA: Diagnosis not present

## 2018-12-14 DIAGNOSIS — Z8571 Personal history of Hodgkin lymphoma: Secondary | ICD-10-CM

## 2018-12-14 DIAGNOSIS — Z7984 Long term (current) use of oral hypoglycemic drugs: Secondary | ICD-10-CM

## 2018-12-14 DIAGNOSIS — R404 Transient alteration of awareness: Secondary | ICD-10-CM | POA: Diagnosis not present

## 2018-12-14 DIAGNOSIS — R197 Diarrhea, unspecified: Secondary | ICD-10-CM | POA: Diagnosis not present

## 2018-12-14 DIAGNOSIS — I130 Hypertensive heart and chronic kidney disease with heart failure and stage 1 through stage 4 chronic kidney disease, or unspecified chronic kidney disease: Secondary | ICD-10-CM | POA: Diagnosis not present

## 2018-12-14 DIAGNOSIS — Z66 Do not resuscitate: Secondary | ICD-10-CM | POA: Diagnosis present

## 2018-12-14 DIAGNOSIS — E559 Vitamin D deficiency, unspecified: Secondary | ICD-10-CM | POA: Diagnosis present

## 2018-12-14 DIAGNOSIS — A049 Bacterial intestinal infection, unspecified: Secondary | ICD-10-CM | POA: Diagnosis present

## 2018-12-14 DIAGNOSIS — D631 Anemia in chronic kidney disease: Secondary | ICD-10-CM | POA: Diagnosis present

## 2018-12-14 DIAGNOSIS — E1129 Type 2 diabetes mellitus with other diabetic kidney complication: Secondary | ICD-10-CM | POA: Diagnosis present

## 2018-12-14 DIAGNOSIS — Z79899 Other long term (current) drug therapy: Secondary | ICD-10-CM

## 2018-12-14 DIAGNOSIS — Y93E1 Activity, personal bathing and showering: Secondary | ICD-10-CM

## 2018-12-14 DIAGNOSIS — A419 Sepsis, unspecified organism: Secondary | ICD-10-CM | POA: Diagnosis not present

## 2018-12-14 DIAGNOSIS — Z8249 Family history of ischemic heart disease and other diseases of the circulatory system: Secondary | ICD-10-CM

## 2018-12-14 DIAGNOSIS — Z90710 Acquired absence of both cervix and uterus: Secondary | ICD-10-CM

## 2018-12-14 DIAGNOSIS — I5032 Chronic diastolic (congestive) heart failure: Secondary | ICD-10-CM | POA: Diagnosis not present

## 2018-12-14 DIAGNOSIS — I10 Essential (primary) hypertension: Secondary | ICD-10-CM | POA: Diagnosis present

## 2018-12-14 DIAGNOSIS — K219 Gastro-esophageal reflux disease without esophagitis: Secondary | ICD-10-CM | POA: Diagnosis present

## 2018-12-14 DIAGNOSIS — I491 Atrial premature depolarization: Secondary | ICD-10-CM | POA: Diagnosis not present

## 2018-12-14 DIAGNOSIS — Z20828 Contact with and (suspected) exposure to other viral communicable diseases: Secondary | ICD-10-CM | POA: Diagnosis present

## 2018-12-14 DIAGNOSIS — U071 COVID-19: Secondary | ICD-10-CM | POA: Diagnosis not present

## 2018-12-14 DIAGNOSIS — R6510 Systemic inflammatory response syndrome (SIRS) of non-infectious origin without acute organ dysfunction: Secondary | ICD-10-CM | POA: Diagnosis present

## 2018-12-14 DIAGNOSIS — E1122 Type 2 diabetes mellitus with diabetic chronic kidney disease: Secondary | ICD-10-CM | POA: Diagnosis present

## 2018-12-14 DIAGNOSIS — E1165 Type 2 diabetes mellitus with hyperglycemia: Secondary | ICD-10-CM | POA: Diagnosis present

## 2018-12-14 DIAGNOSIS — Z880 Allergy status to penicillin: Secondary | ICD-10-CM

## 2018-12-14 DIAGNOSIS — E872 Acidosis, unspecified: Secondary | ICD-10-CM | POA: Diagnosis present

## 2018-12-14 DIAGNOSIS — W182XXA Fall in (into) shower or empty bathtub, initial encounter: Secondary | ICD-10-CM | POA: Diagnosis present

## 2018-12-14 DIAGNOSIS — Z8673 Personal history of transient ischemic attack (TIA), and cerebral infarction without residual deficits: Secondary | ICD-10-CM

## 2018-12-14 DIAGNOSIS — N183 Chronic kidney disease, stage 3 unspecified: Secondary | ICD-10-CM | POA: Diagnosis present

## 2018-12-14 DIAGNOSIS — E1140 Type 2 diabetes mellitus with diabetic neuropathy, unspecified: Secondary | ICD-10-CM | POA: Diagnosis present

## 2018-12-14 DIAGNOSIS — D72829 Elevated white blood cell count, unspecified: Secondary | ICD-10-CM

## 2018-12-14 DIAGNOSIS — Y92091 Bathroom in other non-institutional residence as the place of occurrence of the external cause: Secondary | ICD-10-CM

## 2018-12-14 DIAGNOSIS — E041 Nontoxic single thyroid nodule: Secondary | ICD-10-CM | POA: Diagnosis not present

## 2018-12-14 DIAGNOSIS — F840 Autistic disorder: Secondary | ICD-10-CM | POA: Diagnosis not present

## 2018-12-14 DIAGNOSIS — E1121 Type 2 diabetes mellitus with diabetic nephropathy: Secondary | ICD-10-CM

## 2018-12-14 DIAGNOSIS — E785 Hyperlipidemia, unspecified: Secondary | ICD-10-CM | POA: Diagnosis present

## 2018-12-14 DIAGNOSIS — S0990XA Unspecified injury of head, initial encounter: Secondary | ICD-10-CM | POA: Diagnosis not present

## 2018-12-14 DIAGNOSIS — R296 Repeated falls: Secondary | ICD-10-CM | POA: Diagnosis not present

## 2018-12-14 DIAGNOSIS — R0602 Shortness of breath: Secondary | ICD-10-CM | POA: Diagnosis not present

## 2018-12-14 DIAGNOSIS — Z7982 Long term (current) use of aspirin: Secondary | ICD-10-CM

## 2018-12-14 DIAGNOSIS — M8588 Other specified disorders of bone density and structure, other site: Secondary | ICD-10-CM | POA: Diagnosis present

## 2018-12-14 LAB — CBC WITH DIFFERENTIAL/PLATELET
Abs Immature Granulocytes: 0.15 10*3/uL — ABNORMAL HIGH (ref 0.00–0.07)
Basophils Absolute: 0 10*3/uL (ref 0.0–0.1)
Basophils Relative: 0 %
Eosinophils Absolute: 0.1 10*3/uL (ref 0.0–0.5)
Eosinophils Relative: 1 %
HCT: 39.8 % (ref 36.0–46.0)
Hemoglobin: 12 g/dL (ref 12.0–15.0)
Immature Granulocytes: 1 %
Lymphocytes Relative: 11 %
Lymphs Abs: 1.2 10*3/uL (ref 0.7–4.0)
MCH: 26.5 pg (ref 26.0–34.0)
MCHC: 30.2 g/dL (ref 30.0–36.0)
MCV: 87.9 fL (ref 80.0–100.0)
Monocytes Absolute: 0.4 10*3/uL (ref 0.1–1.0)
Monocytes Relative: 4 %
Neutro Abs: 8.8 10*3/uL — ABNORMAL HIGH (ref 1.7–7.7)
Neutrophils Relative %: 83 %
Platelets: 235 10*3/uL (ref 150–400)
RBC: 4.53 MIL/uL (ref 3.87–5.11)
RDW: 16.3 % — ABNORMAL HIGH (ref 11.5–15.5)
WBC: 10.7 10*3/uL — ABNORMAL HIGH (ref 4.0–10.5)
nRBC: 0 % (ref 0.0–0.2)

## 2018-12-14 LAB — COMPREHENSIVE METABOLIC PANEL
ALT: 21 U/L (ref 0–44)
AST: 25 U/L (ref 15–41)
Albumin: 3.5 g/dL (ref 3.5–5.0)
Alkaline Phosphatase: 81 U/L (ref 38–126)
Anion gap: 12 (ref 5–15)
BUN: 31 mg/dL — ABNORMAL HIGH (ref 8–23)
CO2: 18 mmol/L — ABNORMAL LOW (ref 22–32)
Calcium: 9.2 mg/dL (ref 8.9–10.3)
Chloride: 107 mmol/L (ref 98–111)
Creatinine, Ser: 1.26 mg/dL — ABNORMAL HIGH (ref 0.44–1.00)
GFR calc Af Amer: 45 mL/min — ABNORMAL LOW (ref >60)
GFR calc non Af Amer: 39 mL/min — ABNORMAL LOW (ref >60)
Glucose, Bld: 179 mg/dL — ABNORMAL HIGH (ref 70–99)
Potassium: 5.1 mmol/L (ref 3.5–5.1)
Sodium: 137 mmol/L (ref 135–145)
Total Bilirubin: 0.6 mg/dL (ref 0.3–1.2)
Total Protein: 6.5 g/dL (ref 6.5–8.1)

## 2018-12-14 LAB — AMMONIA: Ammonia: 28 umol/L (ref 9–35)

## 2018-12-14 LAB — LACTIC ACID, PLASMA: Lactic Acid, Venous: 5 mmol/L (ref 0.5–1.9)

## 2018-12-14 LAB — CBG MONITORING, ED: Glucose-Capillary: 184 mg/dL — ABNORMAL HIGH (ref 70–99)

## 2018-12-14 MED ORDER — METRONIDAZOLE IN NACL 5-0.79 MG/ML-% IV SOLN
500.0000 mg | Freq: Once | INTRAVENOUS | Status: AC
  Administered 2018-12-15: 500 mg via INTRAVENOUS
  Filled 2018-12-14: qty 100

## 2018-12-14 MED ORDER — SODIUM CHLORIDE 0.9 % IV SOLN
2.0000 g | Freq: Once | INTRAVENOUS | Status: AC
  Administered 2018-12-15: 2 g via INTRAVENOUS
  Filled 2018-12-14: qty 2

## 2018-12-14 MED ORDER — VANCOMYCIN HCL IN DEXTROSE 1-5 GM/200ML-% IV SOLN: 1000.0000 mg | Freq: Once | INTRAVENOUS | Status: DC

## 2018-12-14 MED ORDER — SODIUM CHLORIDE 0.9 % IV SOLN: 2.0000 g | Freq: Once | INTRAVENOUS | Status: DC

## 2018-12-14 MED ORDER — VANCOMYCIN HCL 10 G IV SOLR
1250.0000 mg | Freq: Once | INTRAVENOUS | Status: AC
  Administered 2018-12-15: 1250 mg via INTRAVENOUS
  Filled 2018-12-14: qty 1250

## 2018-12-14 NOTE — ED Notes (Signed)
Pt transported to CT ?

## 2018-12-14 NOTE — ED Triage Notes (Signed)
Pt coming from [facility] facility after having two witnessed falls in shower today. Witnessed fall by staff, staff denies pt hitting head. Has had multiple episodes of diarrhea today. Staff was trying to clean her up when she fell in shower. Pt seems lethargic and pale upon arrival. Hx of CHF, DM, CKD. CHG in 170s, BP 137/78, HR in 80s.
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
## 2018-12-15 ENCOUNTER — Encounter (HOSPITAL_COMMUNITY): Payer: Self-pay | Admitting: Internal Medicine

## 2018-12-15 ENCOUNTER — Observation Stay (HOSPITAL_COMMUNITY): Payer: Medicare Other

## 2018-12-15 DIAGNOSIS — R6510 Systemic inflammatory response syndrome (SIRS) of non-infectious origin without acute organ dysfunction: Secondary | ICD-10-CM | POA: Diagnosis present

## 2018-12-15 DIAGNOSIS — R197 Diarrhea, unspecified: Secondary | ICD-10-CM | POA: Diagnosis not present

## 2018-12-15 DIAGNOSIS — E872 Acidosis, unspecified: Secondary | ICD-10-CM | POA: Diagnosis present

## 2018-12-15 DIAGNOSIS — E1121 Type 2 diabetes mellitus with diabetic nephropathy: Secondary | ICD-10-CM | POA: Diagnosis not present

## 2018-12-15 LAB — URINALYSIS, COMPLETE (UACMP) WITH MICROSCOPIC
Bacteria, UA: NONE SEEN
Bilirubin Urine: NEGATIVE
Glucose, UA: NEGATIVE mg/dL
Hgb urine dipstick: NEGATIVE
Ketones, ur: 5 mg/dL — AB
Nitrite: NEGATIVE
Protein, ur: 30 mg/dL — AB
Specific Gravity, Urine: 1.023 (ref 1.005–1.030)
pH: 5 (ref 5.0–8.0)

## 2018-12-15 LAB — LACTIC ACID, PLASMA
Lactic Acid, Venous: 2.8 mmol/L (ref 0.5–1.9)
Lactic Acid, Venous: 3.1 mmol/L (ref 0.5–1.9)
Lactic Acid, Venous: 2.9 mmol/L (ref 0.5–1.9)
Lactic Acid, Venous: 2 mmol/L (ref 0.5–1.9)
Lactic Acid, Venous: 3.4 mmol/L (ref 0.5–1.9)

## 2018-12-15 LAB — COMPREHENSIVE METABOLIC PANEL
ALT: 19 U/L (ref 0–44)
AST: 26 U/L (ref 15–41)
Albumin: 3.1 g/dL — ABNORMAL LOW (ref 3.5–5.0)
Alkaline Phosphatase: 69 U/L (ref 38–126)
Anion gap: 10 (ref 5–15)
BUN: 23 mg/dL (ref 8–23)
CO2: 20 mmol/L — ABNORMAL LOW (ref 22–32)
Calcium: 8.5 mg/dL — ABNORMAL LOW (ref 8.9–10.3)
Chloride: 109 mmol/L (ref 98–111)
Creatinine, Ser: 0.91 mg/dL (ref 0.44–1.00)
GFR calc Af Amer: >60 mL/min (ref >60)
GFR calc non Af Amer: 57 mL/min — ABNORMAL LOW (ref >60)
Glucose, Bld: 155 mg/dL — ABNORMAL HIGH (ref 70–99)
Potassium: 4.3 mmol/L (ref 3.5–5.1)
Sodium: 139 mmol/L (ref 135–145)
Total Bilirubin: 0.4 mg/dL (ref 0.3–1.2)
Total Protein: 6 g/dL — ABNORMAL LOW (ref 6.5–8.1)

## 2018-12-15 LAB — CBC WITH DIFFERENTIAL/PLATELET
Abs Immature Granulocytes: 0.14 10*3/uL — ABNORMAL HIGH (ref 0.00–0.07)
Basophils Absolute: 0 10*3/uL (ref 0.0–0.1)
Basophils Relative: 0 %
Eosinophils Absolute: 0 10*3/uL (ref 0.0–0.5)
Eosinophils Relative: 0 %
HCT: 37.9 % (ref 36.0–46.0)
Hemoglobin: 11.3 g/dL — ABNORMAL LOW (ref 12.0–15.0)
Immature Granulocytes: 1 %
Lymphocytes Relative: 18 %
Lymphs Abs: 2.1 10*3/uL (ref 0.7–4.0)
MCH: 26.5 pg (ref 26.0–34.0)
MCHC: 29.8 g/dL — ABNORMAL LOW (ref 30.0–36.0)
MCV: 88.8 fL (ref 80.0–100.0)
Monocytes Absolute: 0.7 10*3/uL (ref 0.1–1.0)
Monocytes Relative: 6 %
Neutro Abs: 8.3 10*3/uL — ABNORMAL HIGH (ref 1.7–7.7)
Neutrophils Relative %: 75 %
Platelets: 327 10*3/uL (ref 150–400)
RBC: 4.27 MIL/uL (ref 3.87–5.11)
RDW: 16.3 % — ABNORMAL HIGH (ref 11.5–15.5)
WBC: 11.2 10*3/uL — ABNORMAL HIGH (ref 4.0–10.5)
nRBC: 0 % (ref 0.0–0.2)

## 2018-12-15 LAB — PROCALCITONIN: Procalcitonin: <0.1 ng/mL

## 2018-12-15 LAB — GLUCOSE, CAPILLARY
Glucose-Capillary: 186 mg/dL — ABNORMAL HIGH (ref 70–99)
Glucose-Capillary: 172 mg/dL — ABNORMAL HIGH (ref 70–99)

## 2018-12-15 LAB — CBG MONITORING, ED
Glucose-Capillary: 113 mg/dL — ABNORMAL HIGH (ref 70–99)
Glucose-Capillary: 113 mg/dL — ABNORMAL HIGH (ref 70–99)

## 2018-12-15 LAB — PROTIME-INR
INR: 1.1 (ref 0.8–1.2)
Prothrombin Time: 13.7 seconds (ref 11.4–15.2)

## 2018-12-15 LAB — MRSA PCR SCREENING: MRSA by PCR: NEGATIVE

## 2018-12-15 LAB — HEMOGLOBIN A1C
Hgb A1c MFr Bld: 7.7 % — ABNORMAL HIGH (ref 4.8–5.6)
Mean Plasma Glucose: 174.29 mg/dL

## 2018-12-15 LAB — SARS CORONAVIRUS 2 BY RT PCR (HOSPITAL ORDER, PERFORMED IN ~~LOC~~ HOSPITAL LAB): SARS Coronavirus 2: NEGATIVE

## 2018-12-15 MED ORDER — ENOXAPARIN SODIUM 40 MG/0.4ML ~~LOC~~ SOLN
40.0000 mg | SUBCUTANEOUS | Status: DC
  Administered 2018-12-15 – 2018-12-16 (×2): 40 mg via SUBCUTANEOUS
  Filled 2018-12-15 (×3): qty 0.4

## 2018-12-15 MED ORDER — ACETAMINOPHEN 650 MG RE SUPP: 650.0000 mg | Freq: Four times a day (QID) | RECTAL | Status: DC | PRN

## 2018-12-15 MED ORDER — VERAPAMIL HCL ER 120 MG PO TBCR
120.0000 mg | EXTENDED_RELEASE_TABLET | Freq: Every day | ORAL | Status: DC
  Administered 2018-12-15 – 2018-12-16 (×2): 120 mg via ORAL
  Filled 2018-12-15 (×2): qty 1

## 2018-12-15 MED ORDER — ASPIRIN 81 MG PO CHEW
81.0000 mg | CHEWABLE_TABLET | Freq: Every day | ORAL | Status: DC
  Administered 2018-12-15 – 2018-12-17 (×3): 81 mg via ORAL
  Filled 2018-12-15 (×3): qty 1

## 2018-12-15 MED ORDER — PANTOPRAZOLE SODIUM 40 MG PO TBEC
40.0000 mg | DELAYED_RELEASE_TABLET | Freq: Every day | ORAL | Status: DC
  Administered 2018-12-15 – 2018-12-17 (×3): 40 mg via ORAL
  Filled 2018-12-15 (×3): qty 1

## 2018-12-15 MED ORDER — SODIUM CHLORIDE 0.9 % IV BOLUS
1000.0000 mL | Freq: Once | INTRAVENOUS | Status: AC
  Administered 2018-12-15: 07:00:00 1000 mL via INTRAVENOUS

## 2018-12-15 MED ORDER — IOHEXOL 300 MG/ML  SOLN
75.0000 mL | Freq: Once | INTRAMUSCULAR | Status: AC | PRN
  Administered 2018-12-15: 75 mL via INTRAVENOUS

## 2018-12-15 MED ORDER — ACETAMINOPHEN 325 MG PO TABS: 650.0000 mg | ORAL_TABLET | Freq: Four times a day (QID) | ORAL | Status: DC | PRN

## 2018-12-15 MED ORDER — ONDANSETRON HCL 4 MG PO TABS: 4.0000 mg | ORAL_TABLET | Freq: Four times a day (QID) | ORAL | Status: DC | PRN

## 2018-12-15 MED ORDER — SODIUM CHLORIDE 0.9 % IV SOLN
INTRAVENOUS | Status: AC
  Administered 2018-12-15: 04:00:00 via INTRAVENOUS

## 2018-12-15 MED ORDER — INSULIN ASPART 100 UNIT/ML ~~LOC~~ SOLN
0.0000 [IU] | Freq: Three times a day (TID) | SUBCUTANEOUS | Status: DC
  Administered 2018-12-15 – 2018-12-17 (×6): 2 [IU] via SUBCUTANEOUS

## 2018-12-15 MED ORDER — SODIUM CHLORIDE 0.9 % IV BOLUS
30.0000 mL/kg | Freq: Once | INTRAVENOUS | Status: AC
  Administered 2018-12-15: 1905 mL via INTRAVENOUS

## 2018-12-15 MED ORDER — ONDANSETRON HCL 4 MG/2ML IJ SOLN: 4.0000 mg | Freq: Four times a day (QID) | INTRAMUSCULAR | Status: DC | PRN

## 2018-12-15 MED ORDER — VANCOMYCIN HCL IN DEXTROSE 750-5 MG/150ML-% IV SOLN
750.0000 mg | INTRAVENOUS | Status: DC
  Administered 2018-12-16: 750 mg via INTRAVENOUS
  Filled 2018-12-15: qty 150

## 2018-12-15 MED ORDER — ATORVASTATIN CALCIUM 10 MG PO TABS
20.0000 mg | ORAL_TABLET | Freq: Every evening | ORAL | Status: DC
  Administered 2018-12-15 – 2018-12-16 (×2): 20 mg via ORAL
  Filled 2018-12-15 (×2): qty 2

## 2018-12-15 MED ORDER — SODIUM CHLORIDE 0.9 % IV SOLN
2.0000 g | Freq: Two times a day (BID) | INTRAVENOUS | Status: DC
  Administered 2018-12-15 – 2018-12-17 (×5): 2 g via INTRAVENOUS
  Filled 2018-12-15 (×5): qty 2

## 2018-12-15 NOTE — ED Notes (Signed)
Museum/gallery conservator, med tech @ [facility] called for update.  Update given.  Amber stated that pt usually very talkative, walks on own but needs assistant getting up, usually stays in room.  Amber will update legal guardian now.

## 2018-12-15 NOTE — ED Notes (Signed)
Lunch tray ordered 

## 2018-12-15 NOTE — ED Notes (Signed)
Breakfast ordered 

## 2018-12-15 NOTE — Progress Notes (Signed)
Pharmacy Antibiotic Note  Lisa Villegas is a 85 y.o. female admitted on 12/14/2018 with sepsis.  Pharmacy has been consulted for Vancomycin and Cefepime dosing.  Plan: Cefepime 2gm IV q12h Vancomycin 750 mg IV Q 24 hrs. Goal AUC 400-550. Expected AUC: 542 SCr used: 1/26 Will f/u renal function, micro data, and pt's clinical condition Vanc levels prn   Height: 5\' 7"  (170.2 cm) Weight: 140 lb (63.5 kg) IBW/kg (Calculated) : 61.6  Temp (24hrs), Avg:98.4 F (36.9 C), Min:98.4 F (36.9 C), Max:98.4 F (36.9 C)  Recent Labs  Lab 12/14/18 2201 12/14/18 2209 12/15/18 0202  WBC  --  10.7*  --   CREATININE  --  1.26*  --   LATICACIDVEN 5.0*  --  2.8*    Estimated Creatinine Clearance: 31.2 mL/min (A) (by C-G formula based on SCr of 1.26 mg/dL (H)).    Allergies  Allergen Reactions  . Penicillins Rash    On MAR: Has patient had a PCN reaction causing immediate rash, facial/tongue/throat swelling, SOB or lightheadedness with hypotension: Yes Has patient had a PCN reaction causing severe rash involving mucus membranes or skin necrosis: Unk Has patient had a PCN reaction that required hospitalization: Unk Has patient had a PCN reaction occurring within the last 10 years: Unk If all of the above answers are "NO", then may proceed with Cephalosporin use.     Antimicrobials this admission: 9/19 Vanc >>  9/19 Cefepime >>   Microbiology results: 9/18 BCx:  9/19 UCx:   9/19 Covid: negative  Thank you for allowing pharmacy to be a part of this patient's care.  Sherlon Handing, PharmD, BCPS 12/15/2018 3:26 AM

## 2018-12-15 NOTE — ED Notes (Signed)
Tele

## 2018-12-15 NOTE — ED Notes (Signed)
Antibiotics started. One set of cultures obtained thus far. PA aware

## 2018-12-15 NOTE — ED Notes (Signed)
2nd attempt to call report  Will call me back

## 2018-12-15 NOTE — H&P (Signed)
History and Physical    Marilin Galletti SSN-[id number] DOB: Feb 16, 1931 DOA: 12/14/2018  PCP: [name], MD  Patient coming from: Assisted living facility.  History obtained from ER physician.  Patient has autism and has difficulty communicating.  Unable to reach patient's legal guardian.  Chief Complaint: Fall.  HPI: Lisa Villegas is a 85 y.o. female with history of autism, diastolic CHF, chronic kidney disease stage III, diabetes mellitus type 2, and previous history of Hodgkin's disease, anemia had a fall at the living facility while getting a bath.  Per the report patient had multiple episodes of diarrhea yesterday and was getting a bath in the tub and patient felt very weak and had to lie on the floor but in the process fell.  Not sure if she hit her head or not.  But per the report did not lose consciousness.  Not complaining of any chest pain or shortness of breath.  ED Course: In the ER patient was afebrile.  COVID-19 test was negative.  CT head and C-spine unremarkable.  Chest x-ray did not show anything acute.  UA was unremarkable.  Patient's lactate came at 5.  Patient was not hypotensive.  Patient was started on fluid bolus per sepsis protocol and empiric antibiotics.  Since patient had diarrhea CT abdomen was done to rule out any intra-abdominal cause for elevated lactate but abdomen appears benign.  On exam patient follows commands and moves all extremities oriented to her name.  Labs reveal creatinine 1.2 blood glucose 179 potassium 5.1 hemoglobin 12 WBC 10.7 platelets 235.  Review of Systems: As per HPI, rest all negative.   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[place])    Autism    Chronic CHF (congestive heart failure) ([place])    per med rec   Chronic kidney disease (CKD)    stage 3 per chart in 09/2015   Diabetes mellitus without complication (HCC)    Diabetic neuropathy (HCC)      History of thyroid nodule    nontoxic goiter per med rec   Hodgkin disease ([place])    in 1980   HTN (hypertension), benign    Hyperlipidemia    Incontinence    Lack of sensation    Osteopenia    per med rec   Poor balance    Stroke ([place])    Tubular adenoma of colon 01/2017   Vitamin D deficiency     Past Surgical History:  Procedure Laterality Date   ABDOMINAL HYSTERECTOMY     BREAST LUMPECTOMY Left      reports that she has never smoked. She has never used smokeless tobacco. She reports that she does not drink alcohol or use drugs.  Allergies  Allergen Reactions   Penicillins Rash    On MAR: Has patient had a PCN reaction causing immediate rash, facial/tongue/throat swelling, SOB or lightheadedness with hypotension: Yes Has patient had a PCN reaction causing severe rash involving mucus membranes or skin necrosis: Unk Has patient had a PCN reaction that required hospitalization: Unk Has patient had a PCN reaction occurring within the last 10 years: Unk If all of the above answers are "NO", then may proceed with Cephalosporin use.     Family History  Problem Relation Age of Onset   CAD Mother        died at 64 yo   Atrial fibrillation Mother  and tachycardia   CAD Father        died at 41 yo   Atrial fibrillation Father        and tachycardia    Prior to Admission medications   Medication Sig Start Date End Date Taking? Authorizing Provider  aspirin 81 MG tablet Take 1 tablet (81 mg total) by mouth daily. 07/26/17  Yes Patrecia Pour, MD  atorvastatin (LIPITOR) 20 MG tablet Take 1 tablet (20 mg total) by mouth every evening. 10/20/16  Yes Henson, Vickie L, NP-C  calcium carbonate (TUMS EX) 750 MG chewable tablet Chew 1 tablet by mouth daily.   Yes [provider]  Cholecalciferol (VITAMIN D3) 2000 UNITS TABS Take 1 tablet by mouth daily.    Yes [provider]  FeFum-FePoly-FA-B Cmp-C-Biot (INTEGRA PLUS PO) Take 1 capsule by  mouth daily.   Yes [provider]  ferrous sulfate 325 (65 FE) MG tablet Take 325 mg by mouth daily.    Yes [provider]  fesoterodine (TOVIAZ) 8 MG TB24 tablet TAKE 1 TAB BY MOUTH EVERY DAY.  DO NOT CRUSH OR CHEW . Patient taking differently: Take 8 mg by mouth daily.  10/25/16  Yes Denita Lung, MD  hydrALAZINE (APRESOLINE) 10 MG tablet Take 1 tablet (10 mg total) by mouth 3 (three) times daily. 08/03/17  Yes Rai, Ripudeep K, MD  hydrALAZINE (APRESOLINE) 25 MG tablet Take 25 mg by mouth 3 (three) times daily as needed (for SBP 150 or greater and DBP 85 or greter).   Yes [provider]  Infant Care Products (BABY SHAMPOO) SHAM Place 1 application into both eyes See admin instructions. Use to cleanse eyes and eyelids at bedtime    Yes [provider]  magnesium oxide (MAG-OX) 400 MG tablet Take 400 mg by mouth daily.   Yes [provider]  metFORMIN (GLUCOPHAGE) 500 MG tablet Take 1,000 mg by mouth 2 (two) times daily with a meal.   Yes [provider]  Multiple Vitamins-Minerals (CERTA-VITE PO) Take 1 tablet by mouth daily.    Yes [provider]  Nutritional Supplements (GLUCERNA PO) Take 1 Can by mouth 2 (two) times daily.    Yes [provider]  omeprazole (PRILOSEC) 20 MG capsule Take 20 mg by mouth daily.  05/01/18  Yes [provider]  Sodium Fluoride (PREVIDENT 5000 BOOSTER PLUS DT) Place 1 application onto teeth every evening.    Yes [provider]  verapamil (CALAN-SR) 120 MG CR tablet Take 120 mg by mouth at bedtime.   Yes [provider]    Physical Exam: Constitutional: Moderately built and nourished. Vitals:   12/15/18 0200 12/15/18 0222 12/15/18 0230 12/15/18 0306  BP: (!) 158/84  (!) 153/78 (!) 188/83  Pulse:      Resp: (!) 21 14 13  (!) 25  Temp:      TempSrc:      SpO2:      Weight:      Height:       Eyes: Anicteric no pallor. ENMT: No discharge from the ears eyes  nose or mouth. Neck: No mass felt.  No neck rigidity. Respiratory: No rhonchi or crepitations. Cardiovascular: S1-S2 heard. Abdomen: Soft nontender bowel sounds present. Musculoskeletal: No edema. Skin: No rash. Neurologic: Alert awake oriented to her name moves all extremities. Psychiatric: Appears confused.   Labs on Admission: I have personally reviewed following labs and imaging studies  CBC: Recent Labs  Lab 12/14/18 2209  WBC  10.7*  NEUTROABS 8.8*  HGB 12.0  HCT 39.8  MCV 87.9  PLT [id number]   Basic Metabolic Panel: Recent Labs  Lab 12/14/18 2209  NA 137  K 5.1  CL 107  CO2 18*  GLUCOSE 179*  BUN 31*  CREATININE 1.26*  CALCIUM 9.2   GFR: Estimated Creatinine Clearance: 31.2 mL/min (A) (by C-G formula based on SCr of 1.26 mg/dL (H)). Liver Function Tests: Recent Labs  Lab 12/14/18 2209  AST 25  ALT 21  ALKPHOS 81  BILITOT 0.6  PROT 6.5  ALBUMIN 3.5   No results for input(s): LIPASE, AMYLASE in the last 168 hours. Recent Labs  Lab 12/14/18 2201  AMMONIA 28   Coagulation Profile: No results for input(s): INR, PROTIME in the last 168 hours. Cardiac Enzymes: No results for input(s): CKTOTAL, CKMB, CKMBINDEX, TROPONINI in the last 168 hours. BNP (last 3 results) No results for input(s): PROBNP in the last 8760 hours. HbA1C: No results for input(s): HGBA1C in the last 72 hours. CBG: Recent Labs  Lab 12/14/18 2153  GLUCAP 184*   Lipid Profile: No results for input(s): CHOL, HDL, LDLCALC, TRIG, CHOLHDL, LDLDIRECT in the last 72 hours. Thyroid Function Tests: No results for input(s): TSH, T4TOTAL, FREET4, T3FREE, THYROIDAB in the last 72 hours. Anemia Panel: No results for input(s): VITAMINB12, FOLATE, FERRITIN, TIBC, IRON, RETICCTPCT in the last 72 hours. Urine analysis:    Component Value Date/Time   COLORURINE YELLOW 12/15/2018 0122   APPEARANCEUR CLEAR 12/15/2018 0122   APPEARANCEUR Clear 10/11/2017 1310   LABSPEC 1.023 12/15/2018 0122    PHURINE 5.0 12/15/2018 0122   GLUCOSEU NEGATIVE 12/15/2018 0122   HGBUR NEGATIVE 12/15/2018 0122   BILIRUBINUR NEGATIVE 12/15/2018 0122   BILIRUBINUR Negative 10/11/2017 1310   KETONESUR 5 (A) 12/15/2018 0122   PROTEINUR 30 (A) 12/15/2018 0122   UROBILINOGEN negative 03/14/2016 1227   NITRITE NEGATIVE 12/15/2018 0122   LEUKOCYTESUR SMALL (A) 12/15/2018 0122   Sepsis Labs: @LABRCNTIP (procalcitonin:4,lacticidven:4) ) Recent Results (from the past 240 hour(s))  SARS Coronavirus 2 [facility] order, Performed in [facility] hospital lab) Nasopharyngeal Nasopharyngeal Swab     Status: None   Collection Time: 12/15/18  1:26 AM   Specimen: Nasopharyngeal Swab  Result Value Ref Range Status   SARS Coronavirus 2 NEGATIVE NEGATIVE Final    Comment: (NOTE) If result is NEGATIVE SARS-CoV-2 target nucleic acids are NOT DETECTED. The SARS-CoV-2 RNA is generally detectable in upper and lower  respiratory specimens during the acute phase of infection. The lowest  concentration of SARS-CoV-2 viral copies this assay can detect is 250  copies / mL. A negative result does not preclude SARS-CoV-2 infection  and should not be used as the sole basis for treatment or other  patient management decisions.  A negative result may occur with  improper specimen collection / handling, submission of specimen other  than nasopharyngeal swab, presence of viral mutation(s) within the  areas targeted by this assay, and inadequate number of viral copies  (<250 copies / mL). A negative result must be combined with clinical  observations, patient history, and epidemiological information. If result is POSITIVE SARS-CoV-2 target nucleic acids are DETECTED. The SARS-CoV-2 RNA is generally detectable in upper and lower  respiratory specimens dur ing the acute phase of infection.  Positive  results are indicative of active infection with SARS-CoV-2.  Clinical  correlation with patient history and other diagnostic  information is  necessary to determine patient infection status.  Positive results do  not rule  out bacterial infection or co-infection with other viruses. If result is PRESUMPTIVE POSTIVE SARS-CoV-2 nucleic acids MAY BE PRESENT.   A presumptive positive result was obtained on the submitted specimen  and confirmed on repeat testing.  While 2019 novel coronavirus  (SARS-CoV-2) nucleic acids may be present in the submitted sample  additional confirmatory testing may be necessary for epidemiological  and / or clinical management purposes  to differentiate between  SARS-CoV-2 and other Sarbecovirus currently known to infect humans.  If clinically indicated additional testing with an alternate test  methodology [phone]) is advised. The SARS-CoV-2 RNA is generally  detectable in upper and lower respiratory sp ecimens during the acute  phase of infection. The expected result is Negative. Fact Sheet for Patients:  StrictlyIdeas.no Fact Sheet for Healthcare Providers: BankingDealers.co.za This test is not yet approved or cleared by the [place] FDA and has been authorized for detection and/or diagnosis of SARS-CoV-2 by FDA under an Emergency Use Authorization (EUA).  This EUA will remain in effect (meaning this test can be used) for the duration of the COVID-19 declaration under Section 564(b)(1) of the Act, 21 U.S.C. section 360bbb-3(b)(1), unless the authorization is terminated or revoked sooner. Performed at [facility], [place] [address] [place], [place] [address]      Radiological Exams on Admission: Ct Head Wo Contrast  Result Date: 12/14/2018 CLINICAL DATA:  Multiple falls EXAM: CT HEAD WITHOUT CONTRAST CT CERVICAL SPINE WITHOUT CONTRAST TECHNIQUE: Multidetector CT imaging of the head and cervical spine was performed following the standard protocol without intravenous contrast. Multiplanar CT image reconstructions of the  cervical spine were also generated. COMPARISON:  03/30/2018, thyroid biopsy, 08/21/2012 FINDINGS: CT HEAD FINDINGS Brain: No evidence of acute infarction, hemorrhage, hydrocephalus, extra-axial collection or mass lesion/mass effect. Periventricular white matter hypodensity. Vascular: No hyperdense vessel or unexpected calcification. Skull: Normal. Negative for fracture or focal lesion. Sinuses/Orbits: No acute finding. Other: None. CT CERVICAL SPINE FINDINGS Alignment: Normal. Skull base and vertebrae: Osteopenia. No acute fracture. No primary bone lesion or focal pathologic process. Soft tissues and spinal canal: No prevertebral fluid or swelling. No visible canal hematoma. Disc levels: Moderate multilevel disc degenerative disease and osteophytosis. Upper chest: Negative. Other: Irregular, hypodense and partially calcified right lobe thyroid nodule measuring approximately 2.0 cm, unchanged compared to prior examination, previously biopsied. IMPRESSION: 1. No acute intracranial pathology. Small-vessel white matter disease. 2. No fracture or static subluxation of the cervical spine. Osteopenia. Multilevel cervical disc degenerative disease. Electronically Signed   By: Eddie Candle M.D.   On: 12/14/2018 22:00   Ct Cervical Spine Wo Contrast  Result Date: 12/14/2018 CLINICAL DATA:  Multiple falls EXAM: CT HEAD WITHOUT CONTRAST CT CERVICAL SPINE WITHOUT CONTRAST TECHNIQUE: Multidetector CT imaging of the head and cervical spine was performed following the standard protocol without intravenous contrast. Multiplanar CT image reconstructions of the cervical spine were also generated. COMPARISON:  03/30/2018, thyroid biopsy, 08/21/2012 FINDINGS: CT HEAD FINDINGS Brain: No evidence of acute infarction, hemorrhage, hydrocephalus, extra-axial collection or mass lesion/mass effect. Periventricular white matter hypodensity. Vascular: No hyperdense vessel or unexpected calcification. Skull: Normal. Negative for fracture or  focal lesion. Sinuses/Orbits: No acute finding. Other: None. CT CERVICAL SPINE FINDINGS Alignment: Normal. Skull base and vertebrae: Osteopenia. No acute fracture. No primary bone lesion or focal pathologic process. Soft tissues and spinal canal: No prevertebral fluid or swelling. No visible canal hematoma. Disc levels: Moderate multilevel disc degenerative disease and osteophytosis. Upper chest: Negative. Other: Irregular, hypodense and partially calcified right  lobe thyroid nodule measuring approximately 2.0 cm, unchanged compared to prior examination, previously biopsied. IMPRESSION: 1. No acute intracranial pathology. Small-vessel white matter disease. 2. No fracture or static subluxation of the cervical spine. Osteopenia. Multilevel cervical disc degenerative disease. Electronically Signed   By: Eddie Candle M.D.   On: 12/14/2018 22:00   Dg Chest Port 1 View  Result Date: 12/14/2018 CLINICAL DATA:  Shortness of breath EXAM: PORTABLE CHEST 1 VIEW COMPARISON:  03/30/2018 FINDINGS: Minimal basilar atelectasis. No consolidation or effusion. Cardiomediastinal silhouette stable with aortic atherosclerosis. No pneumothorax. IMPRESSION: 1. Mild basilar scarring or atelectasis. 2. No acute cardiopulmonary abnormality identified. Electronically Signed   By: Donavan Foil M.D.   On: 12/14/2018 23:49   Dg Hips Bilat W Or Wo Pelvis 3-4 Views  Result Date: 12/14/2018 CLINICAL DATA:  Multiple fall EXAM: DG HIP (WITH OR WITHOUT PELVIS) 3-4V BILAT COMPARISON:  CT 03/30/2018 FINDINGS: SI joints are non widened. Extensive vascular calcification. Pubic symphysis and rami appear intact. Both femoral heads project in joint. Moderate arthritis of both hips. Evaluation of left femoral neck limited on the view of the pelvis no definite acute displaced fracture or malalignment is seen. Prominent osteophytosis at the left femoral head neck junction. IMPRESSION: No definite acute osseous abnormality. Electronically Signed   By:  Donavan Foil M.D.   On: 12/14/2018 23:53    EKG: Independently reviewed.  Normal sinus rhythm.  Assessment/Plan Principal Problem:   SIRS (systemic inflammatory response syndrome) (HCC) Active Problems:   Type II diabetes mellitus with renal manifestations (HCC)   HTN (hypertension), benign   CKD (chronic kidney disease), stage III (HCC)   History of Hodgkin's disease   Lactic acidosis    1. Lactic acidosis/SIRS -patient is afebrile and not hypotensive.  However since patient has had multiple episodes of diarrhea in the living facility will check CT abdomen to rule out any intra-abdominal cause.  We will continue with empiric antibiotics for now with fluids and repeat lactate check procalcitonin levels and follow closely.  Will hold metformin due to lactic acidosis. 2. Diabetes mellitus type 2 -will hold metformin due to lactic acidosis.  On sliding scale coverage. 3. Chronic kidney disease stage III creatinine appears to be at baseline. -Since patient has lactic acidosis may need to hold of metformin from further use admitted to change medication. 4. Hypertension presently holding of hydralazine but will continue verapamil due to lactic acidosis to make sure that patient does not abruptly drop blood pressure.  PRN IV hydralazine. 5. Chronic anemia likely from renal failure follow CBC. 6. History of Hodgkin's lymphoma in remission. 7. History of autism.   DVT prophylaxis: Lovenox. Code Status: DNR. Family Communication: I try to reach patient's legal guardian was unable to. Disposition Plan: Back to facility when stable. Consults called: None. Admission status: Observation.   Rise Patience MD [facility] Pager [phone].  If 7PM-7AM, please contact night-coverage [email or web address] Password [facility]  12/15/2018, 3:23 AM

## 2018-12-15 NOTE — ED Notes (Addendum)
Report called to rn on 2w  Matt Holmes

## 2018-12-15 NOTE — ED Notes (Signed)
Unsuccessful attempt to cal report on 2w  To call me back

## 2018-12-15 NOTE — ED Notes (Signed)
Pt transported to CT ?

## 2018-12-15 NOTE — ED Notes (Signed)
Gave pt sandwich and cheese to eat.

## 2018-12-15 NOTE — ED Notes (Signed)
Attempted in and out cath. Pt bearing down too much and yelling in order to advance cath. Purewick placed

## 2018-12-15 NOTE — ED Notes (Signed)
Pt hungry  No tray has arrived yet  [place] sandwich  Given  Pt ate it all

## 2018-12-15 NOTE — Progress Notes (Signed)
PROGRESS NOTE  Lisa Villegas SSN-[id number] DOB: Feb 09, 1931 DOA: 12/14/2018 PCP: Lisa Seashore, MD  HPI/Recap of past 24 hours: HPI from Dr Lisa Villegas is a 85 y.o. female with history of autism, diastolic CHF, chronic kidney disease stage III, diabetes mellitus type 2, and previous history of Hodgkin's disease, anemia had a fall at the living facility while getting a bath.  Per the report patient had multiple episodes of diarrhea and was getting a bath in the tub and patient felt very weak and had to lie on the floor but in the process fell.  Not sure if she hit her head or not.  But per the report did not lose consciousness.  Not complaining of any chest pain or shortness of breath. In the ED, CT head and C-spine unremarkable.  Chest x-ray did not show anything acute.  UA was unremarkable.  Patient's lactate noted to be 5.  Patient   Patient was started on sepsis protocol and empiric antibiotics.  Since patient had diarrhea CT abdomen was done to rule out any intra-abdominal cause for elevated lactate but abdomen appears benign.  Patient admitted for further management.   Today, patient denies any new complaints, no further diarrhea weakness since in the ED.  Patient denies any abdominal pain, chest pain, nausea/vomiting, fever/chills.  Assessment/Plan: Principal Problem:   SIRS (systemic inflammatory response syndrome) (HCC) Active Problems:   Type II diabetes mellitus with renal manifestations (HCC)   HTN (hypertension), benign   CKD (chronic kidney disease), stage III (HCC)   History of Hodgkin's disease   Lactic acidosis  Lactic acidosis/? R/O sepsis Lactic acidosis 5 on admission, currently trending downwards, will continue to trend ??  Due to metformin Currently afebrile, with uptrending leukocytosis UA unremarkable, UC pending BC x2 pending Procalcitonin pending Chest x-ray unremarkable CT abdomen pelvis, unremarkable Continue empiric antibiotics,  IV cefepime, vancomycin (will DC vancomycin if MRSA PCR is negative) Monitor closely  CKD stage III Creatinine better than baseline Lactic acidosis may be due to metformin in conjunction with CKD  Anemia of chronic kidney disease Hemoglobin at baseline Daily CBC  Hypertension BP somewhat high Continue home verapamil, may restart hydralazine  Diabetes mellitus type 2 A1c 7.7 SSI, Accu-Cheks, hypoglycemic protocol Hold home metformin  History of Hodgkin's lymphoma In remission  History of autism          Malnutrition Type:      Malnutrition Characteristics:      Nutrition Interventions:       Estimated body mass index is 21.93 kg/m as calculated from the following:   Height as of this encounter: 5\' 7"  (1.702 m).   Weight as of this encounter: 63.5 kg.     Code Status: DNR  Family Communication: None at bedside  Disposition Plan: To be determined   Consultants:  None  Procedures:  None  Antimicrobials:  Cefepime  Vancomycin  DVT prophylaxis: Lovenox   Objective: Vitals:   12/15/18 0700 12/15/18 0800 12/15/18 0900 12/15/18 1000  BP: (!) 157/66 107/85 (!) 138/103 (!) 187/68  Pulse:      Resp: 16 20 19 14   Temp:      TempSrc:      SpO2:      Weight:      Height:        Intake/Output Summary (Last 24 hours) at 12/15/2018 1204 Last data filed at 12/15/2018 0902 Gross per 24 hour  Intake 3381 ml  Output 1200 ml  Net 2181 ml  Filed Weights   12/14/18 2110  Weight: 63.5 kg    Exam:  General: NAD   Cardiovascular: S1, S2 present  Respiratory: CTAB  Abdomen: Soft, nontender, nondistended, bowel sounds present  Musculoskeletal: No bilateral pedal edema noted  Skin: Normal  Psychiatry: Normal mood    Data Reviewed: CBC: Recent Labs  Lab 12/14/18 2209 12/15/18 0444  WBC 10.7* 11.2*  NEUTROABS 8.8* 8.3*  HGB 12.0 11.3*  HCT 39.8 37.9  MCV 87.9 88.8  PLT 235 [id number]   Basic Metabolic Panel: Recent Labs    Lab 12/14/18 2209 12/15/18 0444  NA 137 139  K 5.1 4.3  CL 107 109  CO2 18* 20*  GLUCOSE 179* 155*  BUN 31* 23  CREATININE 1.26* 0.91  CALCIUM 9.2 8.5*   GFR: Estimated Creatinine Clearance: 43.2 mL/min (by C-G formula based on SCr of 0.91 mg/dL). Liver Function Tests: Recent Labs  Lab 12/14/18 2209 12/15/18 0444  AST 25 26  ALT 21 19  ALKPHOS 81 69  BILITOT 0.6 0.4  PROT 6.5 6.0*  ALBUMIN 3.5 3.1*   No results for input(s): LIPASE, AMYLASE in the last 168 hours. Recent Labs  Lab 12/14/18 2201  AMMONIA 28   Coagulation Profile: Recent Labs  Lab 12/15/18 0444  INR 1.1   Cardiac Enzymes: No results for input(s): CKTOTAL, CKMB, CKMBINDEX, TROPONINI in the last 168 hours. BNP (last 3 results) No results for input(s): PROBNP in the last 8760 hours. HbA1C: Recent Labs    12/15/18 0444  HGBA1C 7.7*   CBG: Recent Labs  Lab 12/14/18 2153 12/15/18 0412  GLUCAP 184* 113*   Lipid Profile: No results for input(s): CHOL, HDL, LDLCALC, TRIG, CHOLHDL, LDLDIRECT in the last 72 hours. Thyroid Function Tests: No results for input(s): TSH, T4TOTAL, FREET4, T3FREE, THYROIDAB in the last 72 hours. Anemia Panel: No results for input(s): VITAMINB12, FOLATE, FERRITIN, TIBC, IRON, RETICCTPCT in the last 72 hours. Urine analysis:    Component Value Date/Time   COLORURINE YELLOW 12/15/2018 0122   APPEARANCEUR CLEAR 12/15/2018 0122   APPEARANCEUR Clear 10/11/2017 1310   LABSPEC 1.023 12/15/2018 0122   PHURINE 5.0 12/15/2018 0122   GLUCOSEU NEGATIVE 12/15/2018 0122   HGBUR NEGATIVE 12/15/2018 0122   BILIRUBINUR NEGATIVE 12/15/2018 0122   BILIRUBINUR Negative 10/11/2017 1310   KETONESUR 5 (A) 12/15/2018 0122   PROTEINUR 30 (A) 12/15/2018 0122   UROBILINOGEN negative 03/14/2016 1227   NITRITE NEGATIVE 12/15/2018 0122   LEUKOCYTESUR SMALL (A) 12/15/2018 0122   Sepsis Labs: @LABRCNTIP (procalcitonin:4,lacticidven:4)  ) Recent Results (from the past 240 hour(s))   SARS Coronavirus 2 [facility] order, Performed in [facility] hospital lab) Nasopharyngeal Nasopharyngeal Swab     Status: None   Collection Time: 12/15/18  1:26 AM   Specimen: Nasopharyngeal Swab  Result Value Ref Range Status   SARS Coronavirus 2 NEGATIVE NEGATIVE Final    Comment: (NOTE) If result is NEGATIVE SARS-CoV-2 target nucleic acids are NOT DETECTED. The SARS-CoV-2 RNA is generally detectable in upper and lower  respiratory specimens during the acute phase of infection. The lowest  concentration of SARS-CoV-2 viral copies this assay can detect is 250  copies / mL. A negative result does not preclude SARS-CoV-2 infection  and should not be used as the sole basis for treatment or other  patient management decisions.  A negative result may occur with  improper specimen collection / handling, submission of specimen other  than nasopharyngeal swab, presence of viral mutation(s) within the  areas targeted by this assay,  and inadequate number of viral copies  (<250 copies / mL). A negative result must be combined with clinical  observations, patient history, and epidemiological information. If result is POSITIVE SARS-CoV-2 target nucleic acids are DETECTED. The SARS-CoV-2 RNA is generally detectable in upper and lower  respiratory specimens dur ing the acute phase of infection.  Positive  results are indicative of active infection with SARS-CoV-2.  Clinical  correlation with patient history and other diagnostic information is  necessary to determine patient infection status.  Positive results do  not rule out bacterial infection or co-infection with other viruses. If result is PRESUMPTIVE POSTIVE SARS-CoV-2 nucleic acids MAY BE PRESENT.   A presumptive positive result was obtained on the submitted specimen  and confirmed on repeat testing.  While 2019 novel coronavirus  (SARS-CoV-2) nucleic acids may be present in the submitted sample  additional confirmatory testing may be  necessary for epidemiological  and / or clinical management purposes  to differentiate between  SARS-CoV-2 and other Sarbecovirus currently known to infect humans.  If clinically indicated additional testing with an alternate test  methodology [phone]) is advised. The SARS-CoV-2 RNA is generally  detectable in upper and lower respiratory sp ecimens during the acute  phase of infection. The expected result is Negative. Fact Sheet for Patients:  StrictlyIdeas.no Fact Sheet for Healthcare Providers: BankingDealers.co.za This test is not yet approved or cleared by the [place] FDA and has been authorized for detection and/or diagnosis of SARS-CoV-2 by FDA under an Emergency Use Authorization (EUA).  This EUA will remain in effect (meaning this test can be used) for the duration of the COVID-19 declaration under Section 564(b)(1) of the Act, 21 U.S.C. section 360bbb-3(b)(1), unless the authorization is terminated or revoked sooner. Performed at [facility], [place] [address], [place], [place] [address]       Studies: Ct Head Wo Contrast  Result Date: 12/14/2018 CLINICAL DATA:  Multiple falls EXAM: CT HEAD WITHOUT CONTRAST CT CERVICAL SPINE WITHOUT CONTRAST TECHNIQUE: Multidetector CT imaging of the head and cervical spine was performed following the standard protocol without intravenous contrast. Multiplanar CT image reconstructions of the cervical spine were also generated. COMPARISON:  03/30/2018, thyroid biopsy, 08/21/2012 FINDINGS: CT HEAD FINDINGS Brain: No evidence of acute infarction, hemorrhage, hydrocephalus, extra-axial collection or mass lesion/mass effect. Periventricular white matter hypodensity. Vascular: No hyperdense vessel or unexpected calcification. Skull: Normal. Negative for fracture or focal lesion. Sinuses/Orbits: No acute finding. Other: None. CT CERVICAL SPINE FINDINGS Alignment: Normal. Skull base and vertebrae:  Osteopenia. No acute fracture. No primary bone lesion or focal pathologic process. Soft tissues and spinal canal: No prevertebral fluid or swelling. No visible canal hematoma. Disc levels: Moderate multilevel disc degenerative disease and osteophytosis. Upper chest: Negative. Other: Irregular, hypodense and partially calcified right lobe thyroid nodule measuring approximately 2.0 cm, unchanged compared to prior examination, previously biopsied. IMPRESSION: 1. No acute intracranial pathology. Small-vessel white matter disease. 2. No fracture or static subluxation of the cervical spine. Osteopenia. Multilevel cervical disc degenerative disease. Electronically Signed   By: Eddie Candle M.D.   On: 12/14/2018 22:00   Ct Cervical Spine Wo Contrast  Result Date: 12/14/2018 CLINICAL DATA:  Multiple falls EXAM: CT HEAD WITHOUT CONTRAST CT CERVICAL SPINE WITHOUT CONTRAST TECHNIQUE: Multidetector CT imaging of the head and cervical spine was performed following the standard protocol without intravenous contrast. Multiplanar CT image reconstructions of the cervical spine were also generated. COMPARISON:  03/30/2018, thyroid biopsy, 08/21/2012 FINDINGS: CT HEAD FINDINGS Brain: No evidence  of acute infarction, hemorrhage, hydrocephalus, extra-axial collection or mass lesion/mass effect. Periventricular white matter hypodensity. Vascular: No hyperdense vessel or unexpected calcification. Skull: Normal. Negative for fracture or focal lesion. Sinuses/Orbits: No acute finding. Other: None. CT CERVICAL SPINE FINDINGS Alignment: Normal. Skull base and vertebrae: Osteopenia. No acute fracture. No primary bone lesion or focal pathologic process. Soft tissues and spinal canal: No prevertebral fluid or swelling. No visible canal hematoma. Disc levels: Moderate multilevel disc degenerative disease and osteophytosis. Upper chest: Negative. Other: Irregular, hypodense and partially calcified right lobe thyroid nodule measuring  approximately 2.0 cm, unchanged compared to prior examination, previously biopsied. IMPRESSION: 1. No acute intracranial pathology. Small-vessel white matter disease. 2. No fracture or static subluxation of the cervical spine. Osteopenia. Multilevel cervical disc degenerative disease. Electronically Signed   By: Eddie Candle M.D.   On: 12/14/2018 22:00   Ct Abdomen Pelvis W Contrast  Result Date: 12/15/2018 CLINICAL DATA:  Abdominal pain diarrhea EXAM: CT ABDOMEN AND PELVIS WITH CONTRAST TECHNIQUE: Multidetector CT imaging of the abdomen and pelvis was performed using the standard protocol following bolus administration of intravenous contrast. CONTRAST:  2mL OMNIPAQUE IOHEXOL 300 MG/ML  SOLN COMPARISON:  CT 03/30/2018 FINDINGS: Lower chest: Lung bases demonstrate small pleural effusions. Normal heart size. Moderate hiatal hernia. Small right posterior fat containing diaphragmatic hernia. Hepatobiliary: No focal liver abnormality is seen. No gallstones, gallbladder wall thickening, or biliary dilatation. Pancreas: Unremarkable. No pancreatic ductal dilatation or surrounding inflammatory changes. Spleen: Normal in size without focal abnormality. Adrenals/Urinary Tract: Stable left adrenal gland nodularity. Normal right adrenal gland. Enlarged renal pelvises without hydroureter. Parapelvic cysts on the left. Subcentimeter hypodensities in both kidneys too small to further characterize. Distended urinary bladder Stomach/Bowel: The stomach is nonenlarged. No dilated small bowel. No colon wall thickening. Appendix not well seen but no right lower quadrant inflammatory process. Vascular/Lymphatic: Moderate aortic atherosclerosis. No aneurysmal dilatation. No significantly enlarged lymph nodes Reproductive: Status post hysterectomy. No adnexal masses. Other: Negative for free air or free fluid. Musculoskeletal: Chronic burst fracture deformity at L3 with retropulsion. Stable inferior endplate deformity L1. IMPRESSION:  1. No CT evidence for acute intra-abdominal or pelvic abnormality. 2. Moderate hiatal hernia 3. Chronic burst fracture deformity L3 Electronically Signed   By: Donavan Foil M.D.   On: 12/15/2018 03:58   Dg Chest Port 1 View  Result Date: 12/14/2018 CLINICAL DATA:  Shortness of breath EXAM: PORTABLE CHEST 1 VIEW COMPARISON:  03/30/2018 FINDINGS: Minimal basilar atelectasis. No consolidation or effusion. Cardiomediastinal silhouette stable with aortic atherosclerosis. No pneumothorax. IMPRESSION: 1. Mild basilar scarring or atelectasis. 2. No acute cardiopulmonary abnormality identified. Electronically Signed   By: Donavan Foil M.D.   On: 12/14/2018 23:49   Dg Hips Bilat W Or Wo Pelvis 3-4 Views  Result Date: 12/14/2018 CLINICAL DATA:  Multiple fall EXAM: DG HIP (WITH OR WITHOUT PELVIS) 3-4V BILAT COMPARISON:  CT 03/30/2018 FINDINGS: SI joints are non widened. Extensive vascular calcification. Pubic symphysis and rami appear intact. Both femoral heads project in joint. Moderate arthritis of both hips. Evaluation of left femoral neck limited on the view of the pelvis no definite acute displaced fracture or malalignment is seen. Prominent osteophytosis at the left femoral head neck junction. IMPRESSION: No definite acute osseous abnormality. Electronically Signed   By: Donavan Foil M.D.   On: 12/14/2018 23:53    Scheduled Meds:  aspirin  81 mg Oral Daily   atorvastatin  20 mg Oral QPM   enoxaparin (LOVENOX) injection  40 mg Subcutaneous Q24H  insulin aspart  0-9 Units Subcutaneous TID WC   pantoprazole  40 mg Oral Daily   verapamil  120 mg Oral QHS    Continuous Infusions:  sodium chloride 100 mL/hr at 12/15/18 0858   ceFEPime (MAXIPIME) IV 2 g (12/15/18 1042)   [START ON 12/16/2018] vancomycin       LOS: 0 days     Alma Friendly, MD [facility]  If 7PM-7AM, please contact night-coverage [email or web address] 12/15/2018, 12:04 PM

## 2018-12-16 ENCOUNTER — Observation Stay (HOSPITAL_COMMUNITY): Payer: Medicare Other

## 2018-12-16 DIAGNOSIS — E872 Acidosis: Secondary | ICD-10-CM

## 2018-12-16 DIAGNOSIS — R279 Unspecified lack of coordination: Secondary | ICD-10-CM | POA: Diagnosis not present

## 2018-12-16 DIAGNOSIS — D631 Anemia in chronic kidney disease: Secondary | ICD-10-CM | POA: Diagnosis present

## 2018-12-16 DIAGNOSIS — I130 Hypertensive heart and chronic kidney disease with heart failure and stage 1 through stage 4 chronic kidney disease, or unspecified chronic kidney disease: Secondary | ICD-10-CM | POA: Diagnosis present

## 2018-12-16 DIAGNOSIS — I10 Essential (primary) hypertension: Secondary | ICD-10-CM | POA: Diagnosis not present

## 2018-12-16 DIAGNOSIS — E860 Dehydration: Secondary | ICD-10-CM | POA: Diagnosis not present

## 2018-12-16 DIAGNOSIS — Z7984 Long term (current) use of oral hypoglycemic drugs: Secondary | ICD-10-CM | POA: Diagnosis not present

## 2018-12-16 DIAGNOSIS — A419 Sepsis, unspecified organism: Secondary | ICD-10-CM | POA: Diagnosis present

## 2018-12-16 DIAGNOSIS — N183 Chronic kidney disease, stage 3 (moderate): Secondary | ICD-10-CM | POA: Diagnosis not present

## 2018-12-16 DIAGNOSIS — Z8571 Personal history of Hodgkin lymphoma: Secondary | ICD-10-CM | POA: Diagnosis not present

## 2018-12-16 DIAGNOSIS — Z66 Do not resuscitate: Secondary | ICD-10-CM | POA: Diagnosis present

## 2018-12-16 DIAGNOSIS — M8588 Other specified disorders of bone density and structure, other site: Secondary | ICD-10-CM | POA: Diagnosis present

## 2018-12-16 DIAGNOSIS — Z8673 Personal history of transient ischemic attack (TIA), and cerebral infarction without residual deficits: Secondary | ICD-10-CM | POA: Diagnosis not present

## 2018-12-16 DIAGNOSIS — Z20828 Contact with and (suspected) exposure to other viral communicable diseases: Secondary | ICD-10-CM | POA: Diagnosis present

## 2018-12-16 DIAGNOSIS — Z880 Allergy status to penicillin: Secondary | ICD-10-CM | POA: Diagnosis not present

## 2018-12-16 DIAGNOSIS — K219 Gastro-esophageal reflux disease without esophagitis: Secondary | ICD-10-CM | POA: Diagnosis present

## 2018-12-16 DIAGNOSIS — E785 Hyperlipidemia, unspecified: Secondary | ICD-10-CM | POA: Diagnosis present

## 2018-12-16 DIAGNOSIS — R6510 Systemic inflammatory response syndrome (SIRS) of non-infectious origin without acute organ dysfunction: Secondary | ICD-10-CM | POA: Diagnosis not present

## 2018-12-16 DIAGNOSIS — Y93E1 Activity, personal bathing and showering: Secondary | ICD-10-CM | POA: Diagnosis not present

## 2018-12-16 DIAGNOSIS — E1121 Type 2 diabetes mellitus with diabetic nephropathy: Secondary | ICD-10-CM | POA: Diagnosis not present

## 2018-12-16 DIAGNOSIS — I5032 Chronic diastolic (congestive) heart failure: Secondary | ICD-10-CM | POA: Diagnosis present

## 2018-12-16 DIAGNOSIS — Z90710 Acquired absence of both cervix and uterus: Secondary | ICD-10-CM | POA: Diagnosis not present

## 2018-12-16 DIAGNOSIS — E1140 Type 2 diabetes mellitus with diabetic neuropathy, unspecified: Secondary | ICD-10-CM | POA: Diagnosis present

## 2018-12-16 DIAGNOSIS — A049 Bacterial intestinal infection, unspecified: Secondary | ICD-10-CM | POA: Diagnosis present

## 2018-12-16 DIAGNOSIS — Z7982 Long term (current) use of aspirin: Secondary | ICD-10-CM | POA: Diagnosis not present

## 2018-12-16 DIAGNOSIS — Z743 Need for continuous supervision: Secondary | ICD-10-CM | POA: Diagnosis not present

## 2018-12-16 DIAGNOSIS — F840 Autistic disorder: Secondary | ICD-10-CM | POA: Diagnosis present

## 2018-12-16 DIAGNOSIS — E1122 Type 2 diabetes mellitus with diabetic chronic kidney disease: Secondary | ICD-10-CM | POA: Diagnosis present

## 2018-12-16 DIAGNOSIS — W182XXA Fall in (into) shower or empty bathtub, initial encounter: Secondary | ICD-10-CM | POA: Diagnosis present

## 2018-12-16 DIAGNOSIS — Z8249 Family history of ischemic heart disease and other diseases of the circulatory system: Secondary | ICD-10-CM | POA: Diagnosis not present

## 2018-12-16 DIAGNOSIS — Y92091 Bathroom in other non-institutional residence as the place of occurrence of the external cause: Secondary | ICD-10-CM | POA: Diagnosis not present

## 2018-12-16 DIAGNOSIS — R918 Other nonspecific abnormal finding of lung field: Secondary | ICD-10-CM | POA: Diagnosis not present

## 2018-12-16 DIAGNOSIS — E1165 Type 2 diabetes mellitus with hyperglycemia: Secondary | ICD-10-CM | POA: Diagnosis present

## 2018-12-16 DIAGNOSIS — E559 Vitamin D deficiency, unspecified: Secondary | ICD-10-CM | POA: Diagnosis present

## 2018-12-16 DIAGNOSIS — Z79899 Other long term (current) drug therapy: Secondary | ICD-10-CM | POA: Diagnosis not present

## 2018-12-16 LAB — CBC WITH DIFFERENTIAL/PLATELET
Abs Immature Granulocytes: 0.08 10*3/uL — ABNORMAL HIGH (ref 0.00–0.07)
Basophils Absolute: 0.1 10*3/uL (ref 0.0–0.1)
Basophils Relative: 1 %
Eosinophils Absolute: 0.3 10*3/uL (ref 0.0–0.5)
Eosinophils Relative: 3 %
HCT: 33.8 % — ABNORMAL LOW (ref 36.0–46.0)
Hemoglobin: 10.7 g/dL — ABNORMAL LOW (ref 12.0–15.0)
Immature Granulocytes: 1 %
Lymphocytes Relative: 22 %
Lymphs Abs: 2.1 10*3/uL (ref 0.7–4.0)
MCH: 27 pg (ref 26.0–34.0)
MCHC: 31.7 g/dL (ref 30.0–36.0)
MCV: 85.1 fL (ref 80.0–100.0)
Monocytes Absolute: 0.6 10*3/uL (ref 0.1–1.0)
Monocytes Relative: 6 %
Neutro Abs: 6.4 10*3/uL (ref 1.7–7.7)
Neutrophils Relative %: 67 %
Platelets: 280 10*3/uL (ref 150–400)
RBC: 3.97 MIL/uL (ref 3.87–5.11)
RDW: 16.1 % — ABNORMAL HIGH (ref 11.5–15.5)
WBC: 9.5 10*3/uL (ref 4.0–10.5)
nRBC: 0 % (ref 0.0–0.2)

## 2018-12-16 LAB — BASIC METABOLIC PANEL
Anion gap: 11 (ref 5–15)
BUN: 17 mg/dL (ref 8–23)
CO2: 20 mmol/L — ABNORMAL LOW (ref 22–32)
Calcium: 8.6 mg/dL — ABNORMAL LOW (ref 8.9–10.3)
Chloride: 105 mmol/L (ref 98–111)
Creatinine, Ser: 0.8 mg/dL (ref 0.44–1.00)
GFR calc Af Amer: >60 mL/min (ref >60)
GFR calc non Af Amer: >60 mL/min (ref >60)
Glucose, Bld: 211 mg/dL — ABNORMAL HIGH (ref 70–99)
Potassium: 3.7 mmol/L (ref 3.5–5.1)
Sodium: 136 mmol/L (ref 135–145)

## 2018-12-16 LAB — URINE CULTURE: Culture: <10000 — AB

## 2018-12-16 LAB — GLUCOSE, CAPILLARY
Glucose-Capillary: 184 mg/dL — ABNORMAL HIGH (ref 70–99)
Glucose-Capillary: 178 mg/dL — ABNORMAL HIGH (ref 70–99)
Glucose-Capillary: 155 mg/dL — ABNORMAL HIGH (ref 70–99)
Glucose-Capillary: 148 mg/dL — ABNORMAL HIGH (ref 70–99)

## 2018-12-16 LAB — LACTIC ACID, PLASMA: Lactic Acid, Venous: 1.8 mmol/L (ref 0.5–1.9)

## 2018-12-16 MED ORDER — SODIUM CHLORIDE 0.9 % IV SOLN: INTRAVENOUS | Status: DC

## 2018-12-16 MED ORDER — HYDRALAZINE HCL 10 MG PO TABS
10.0000 mg | ORAL_TABLET | Freq: Three times a day (TID) | ORAL | Status: DC
  Administered 2018-12-16 – 2018-12-17 (×2): 10 mg via ORAL
  Filled 2018-12-16 (×2): qty 1

## 2018-12-16 NOTE — Progress Notes (Addendum)
PROGRESS NOTE  Lisa Villegas SSN-[id number] DOB: Jun 19, 1930 DOA: 12/14/2018 PCP: [name], MD  HPI/Recap of past 24 hours: HPI from Dr Quenton Fetter Lisa Villegas is a 85 y.o. female with history of autism, diastolic CHF, chronic kidney disease stage III, diabetes mellitus type 2, and previous history of Hodgkin's disease, anemia had a fall at the living facility while getting a bath.  Per the report patient had multiple episodes of diarrhea and was getting a bath in the tub and patient felt very weak and had to lie on the floor but in the process fell.  Not sure if she hit her head or not.  But per the report did not lose consciousness.  Not complaining of any chest pain or shortness of breath. In the ED, CT head and C-spine unremarkable.  Chest x-ray did not show anything acute.  UA was unremarkable.  Patient's lactate noted to be 5.  Patient   Patient was started on sepsis protocol and empiric antibiotics.  Since patient had diarrhea CT abdomen was done to rule out any intra-abdominal cause for elevated lactate but abdomen appears benign.  Patient admitted for further management.   Today, patient denies any new complaints.  Assessment/Plan: Principal Problem:   SIRS (systemic inflammatory response syndrome) (HCC) Active Problems:   Type II diabetes mellitus with renal manifestations (HCC)   HTN (hypertension), benign   CKD (chronic kidney disease), stage III (HCC)   History of Hodgkin's disease   Lactic acidosis  Lactic acidosis/? R/O sepsis Vs HCAP Resolved Lactic acidosis 5 on admission, now 1.8 ??  Due to metformin Currently afebrile, with resolved leukocytosis UA unremarkable, UC <10,000 colonies insignificant growth BC x2 NGTD Procalcitonin <0.10 Chest x-ray unremarkable, repeat showed ??possible pneumonia CT abdomen pelvis, unremarkable Continue empiric antibiotics, IV cefepime, d/c vancomycin (MRSA PCR is negative) Monitor closely  CKD stage III  Creatinine better than baseline Lactic acidosis may be due to metformin in conjunction with CKD  Anemia of chronic kidney disease Hemoglobin at baseline Daily CBC  Hypertension BP somewhat high Continue home verapamil, hydralazine  Diabetes mellitus type 2 A1c 7.7 SSI, Accu-Cheks, hypoglycemic protocol Hold home metformin  History of Hodgkin's lymphoma In remission  History of autism          Malnutrition Type:      Malnutrition Characteristics:      Nutrition Interventions:       Estimated body mass index is 23.13 kg/m as calculated from the following:   Height as of this encounter: 5\' 7"  (1.702 m).   Weight as of this encounter: 67 kg.     Code Status: DNR  Family Communication: None at bedside  Disposition Plan: Likely back to assisted living facility in the a.m., was not able to accept back to ALF today   Consultants:  None  Procedures:  None  Antimicrobials:  Cefepime   DVT prophylaxis: Lovenox   Objective: Vitals:   12/15/18 2347 12/16/18 0442 12/16/18 0734 12/16/18 1628  BP: (!) 166/89  (!) 160/60 (!) 162/74  Pulse: 82  70 70  Resp: 18  20 20   Temp: 98.3 F (36.8 C)  98.3 F (36.8 C) 98.3 F (36.8 C)  TempSrc: Oral  Oral   SpO2: 97%  98% 98%  Weight:  67 kg    Height:        Intake/Output Summary (Last 24 hours) at 12/16/2018 1819 Last data filed at 12/16/2018 1808 Gross per 24 hour  Intake 990 ml  Output 3300  ml  Net -2310 ml   Filed Weights   12/14/18 2110 12/16/18 0442  Weight: 63.5 kg 67 kg    Exam:  General: NAD   Cardiovascular: S1, S2 present  Respiratory: CTAB  Abdomen: Soft, nontender, nondistended, bowel sounds present  Musculoskeletal: No bilateral pedal edema noted  Skin: Normal  Psychiatry: Normal mood    Data Reviewed: CBC: Recent Labs  Lab 12/14/18 2209 12/15/18 0444 12/16/18 0706  WBC 10.7* 11.2* 9.5  NEUTROABS 8.8* 8.3* 6.4  HGB 12.0 11.3* 10.7*  HCT 39.8 37.9 33.8*   MCV 87.9 88.8 85.1  PLT 235 327 [id number]   Basic Metabolic Panel: Recent Labs  Lab 12/14/18 2209 12/15/18 0444 12/16/18 0706  NA 137 139 136  K 5.1 4.3 3.7  CL 107 109 105  CO2 18* 20* 20*  GLUCOSE 179* 155* 211*  BUN 31* 23 17  CREATININE 1.26* 0.91 0.80  CALCIUM 9.2 8.5* 8.6*   GFR: Estimated Creatinine Clearance: 49.1 mL/min (by C-G formula based on SCr of 0.8 mg/dL). Liver Function Tests: Recent Labs  Lab 12/14/18 2209 12/15/18 0444  AST 25 26  ALT 21 19  ALKPHOS 81 69  BILITOT 0.6 0.4  PROT 6.5 6.0*  ALBUMIN 3.5 3.1*   No results for input(s): LIPASE, AMYLASE in the last 168 hours. Recent Labs  Lab 12/14/18 2201  AMMONIA 28   Coagulation Profile: Recent Labs  Lab 12/15/18 0444  INR 1.1   Cardiac Enzymes: No results for input(s): CKTOTAL, CKMB, CKMBINDEX, TROPONINI in the last 168 hours. BNP (last 3 results) No results for input(s): PROBNP in the last 8760 hours. HbA1C: Recent Labs    12/15/18 0444  HGBA1C 7.7*   CBG: Recent Labs  Lab 12/15/18 1724 12/15/18 2130 12/16/18 0735 12/16/18 1113 12/16/18 1630  GLUCAP 186* 172* 184* 178* 155*   Lipid Profile: No results for input(s): CHOL, HDL, LDLCALC, TRIG, CHOLHDL, LDLDIRECT in the last 72 hours. Thyroid Function Tests: No results for input(s): TSH, T4TOTAL, FREET4, T3FREE, THYROIDAB in the last 72 hours. Anemia Panel: No results for input(s): VITAMINB12, FOLATE, FERRITIN, TIBC, IRON, RETICCTPCT in the last 72 hours. Urine analysis:    Component Value Date/Time   COLORURINE YELLOW 12/15/2018 0122   APPEARANCEUR CLEAR 12/15/2018 0122   APPEARANCEUR Clear 10/11/2017 1310   LABSPEC 1.023 12/15/2018 0122   PHURINE 5.0 12/15/2018 0122   GLUCOSEU NEGATIVE 12/15/2018 0122   HGBUR NEGATIVE 12/15/2018 0122   BILIRUBINUR NEGATIVE 12/15/2018 0122   BILIRUBINUR Negative 10/11/2017 1310   KETONESUR 5 (A) 12/15/2018 0122   PROTEINUR 30 (A) 12/15/2018 0122   UROBILINOGEN negative 03/14/2016 1227    NITRITE NEGATIVE 12/15/2018 0122   LEUKOCYTESUR SMALL (A) 12/15/2018 0122   Sepsis Labs: @LABRCNTIP (procalcitonin:4,lacticidven:4)  ) Recent Results (from the past 240 hour(s))  Blood Cultures (routine x 2)     Status: None (Preliminary result)   Collection Time: 12/14/18 10:33 PM   Specimen: BLOOD  Result Value Ref Range Status   Specimen Description BLOOD LEFT ANTECUBITAL  Final   Special Requests   Final    BOTTLES DRAWN AEROBIC AND ANAEROBIC Blood Culture adequate volume   Culture   Final    NO GROWTH 2 DAYS Performed at [facility], [place] [address], [place],  [address]    Report Status PENDING  Incomplete  Urine culture     Status: Abnormal   Collection Time: 12/15/18  1:24 AM   Specimen: In/Out Cath Urine  Result Value Ref Range Status  Specimen Description IN/OUT CATH URINE  Final   Special Requests NONE  Final   Culture (A)  Final    <10,000 COLONIES/mL INSIGNIFICANT GROWTH Performed at [facility], [place] [address], [place], [place] [address]    Report Status 12/16/2018 FINAL  Final  SARS Coronavirus 2 [facility] order, Performed in [facility] hospital lab) Nasopharyngeal Nasopharyngeal Swab     Status: None   Collection Time: 12/15/18  1:26 AM   Specimen: Nasopharyngeal Swab  Result Value Ref Range Status   SARS Coronavirus 2 NEGATIVE NEGATIVE Final    Comment: (NOTE) If result is NEGATIVE SARS-CoV-2 target nucleic acids are NOT DETECTED. The SARS-CoV-2 RNA is generally detectable in upper and lower  respiratory specimens during the acute phase of infection. The lowest  concentration of SARS-CoV-2 viral copies this assay can detect is 250  copies / mL. A negative result does not preclude SARS-CoV-2 infection  and should not be used as the sole basis for treatment or other  patient management decisions.  A negative result may occur with  improper specimen collection / handling, submission of specimen other  than nasopharyngeal swab, presence  of viral mutation(s) within the  areas targeted by this assay, and inadequate number of viral copies  (<250 copies / mL). A negative result must be combined with clinical  observations, patient history, and epidemiological information. If result is POSITIVE SARS-CoV-2 target nucleic acids are DETECTED. The SARS-CoV-2 RNA is generally detectable in upper and lower  respiratory specimens dur ing the acute phase of infection.  Positive  results are indicative of active infection with SARS-CoV-2.  Clinical  correlation with patient history and other diagnostic information is  necessary to determine patient infection status.  Positive results do  not rule out bacterial infection or co-infection with other viruses. If result is PRESUMPTIVE POSTIVE SARS-CoV-2 nucleic acids MAY BE PRESENT.   A presumptive positive result was obtained on the submitted specimen  and confirmed on repeat testing.  While 2019 novel coronavirus  (SARS-CoV-2) nucleic acids may be present in the submitted sample  additional confirmatory testing may be necessary for epidemiological  and / or clinical management purposes  to differentiate between  SARS-CoV-2 and other Sarbecovirus currently known to infect humans.  If clinically indicated additional testing with an alternate test  methodology [phone]) is advised. The SARS-CoV-2 RNA is generally  detectable in upper and lower respiratory sp ecimens during the acute  phase of infection. The expected result is Negative. Fact Sheet for Patients:  StrictlyIdeas.no Fact Sheet for Healthcare Providers: BankingDealers.co.za This test is not yet approved or cleared by the [place] FDA and has been authorized for detection and/or diagnosis of SARS-CoV-2 by FDA under an Emergency Use Authorization (EUA).  This EUA will remain in effect (meaning this test can be used) for the duration of the COVID-19 declaration under Section  564(b)(1) of the Act, 21 U.S.C. section 360bbb-3(b)(1), unless the authorization is terminated or revoked sooner. Performed at [facility], [place] [address], [place], [place] [address]   Blood Cultures (routine x 2)     Status: None (Preliminary result)   Collection Time: 12/15/18  4:44 AM   Specimen: BLOOD  Result Value Ref Range Status   Specimen Description BLOOD RIGHT ANTECUBITAL  Final   Special Requests   Final    BOTTLES DRAWN AEROBIC ONLY Blood Culture results may not be optimal due to an inadequate volume of blood received in culture bottles   Culture  Final    NO GROWTH 1 DAY Performed at [facility], [place] [address], [place], [place] [address]    Report Status PENDING  Incomplete  MRSA PCR Screening     Status: None   Collection Time: 12/15/18 10:59 AM   Specimen: Nasal Mucosa; Nasopharyngeal  Result Value Ref Range Status   MRSA by PCR NEGATIVE NEGATIVE Final    Comment:        The GeneXpert MRSA Assay (FDA approved for NASAL specimens only), is one component of a comprehensive MRSA colonization surveillance program. It is not intended to diagnose MRSA infection nor to guide or monitor treatment for MRSA infections. Performed at [facility], [place] [address], [place], [place] [address]       Studies: Dg Chest Port 1 View  Result Date: 12/16/2018 CLINICAL DATA:  Leukocytosis. EXAM: PORTABLE CHEST 1 VIEW COMPARISON:  12/14/2018 FINDINGS: Cardiac silhouette normal in size. Lung base opacities are noted most likely a combination small effusions with atelectasis. Pneumonia possible. Mid to upper lungs are clear. IMPRESSION: 1. Mild increase and by basilar lung opacities from the most recent prior exam. This is most likely due to a combination of small effusions and atelectasis. Pneumonia is possible. No evidence of pulmonary edema. Electronically Signed   By: Lajean Manes M.D.   On: 12/16/2018 07:30    Scheduled Meds: . aspirin  81 mg Oral Daily   . atorvastatin  20 mg Oral QPM  . enoxaparin (LOVENOX) injection  40 mg Subcutaneous Q24H  . insulin aspart  0-9 Units Subcutaneous TID WC  . pantoprazole  40 mg Oral Daily  . verapamil  120 mg Oral QHS    Continuous Infusions: . ceFEPime (MAXIPIME) IV 2 g (12/16/18 1057)     LOS: 0 days     Alma Friendly, MD [facility]  If 7PM-7AM, please contact night-coverage [email or web address] 12/16/2018, 6:19 PM

## 2018-12-16 NOTE — Care Management Obs Status (Signed)
[facility] NOTIFICATION   Patient Details  Name: Tyjae Zottola MRN: [id number] Date of Birth: 1931-02-06   Medicare Observation Status Notification Given:  Yes    Carles Collet, RN 12/16/2018, 9:59 AM

## 2018-12-16 NOTE — Evaluation (Signed)
Physical Therapy Evaluation Patient Details Name: Lisa Villegas MRN: [id number] DOB: 1930/10/18 Today's Date: 12/16/2018   History of Present Illness  Lisa Villegas is a 85 y.o. female with history of autism, diastolic CHF, chronic kidney disease stage III, diabetes mellitus type 2, and previous history of Hodgkin's disease, anemia had a fall at the living facility while getting a bath.  Per the report patient had multiple episodes of diarrhea and was getting a bath in the tub and patient felt very weak and had to lie on the floor but in the process fell. Pt's lactate was 5  Clinical Impression  Pt admitted with above diagnosis. From chart review, pt seems at or near baseline mobility status. Min-guard A needed for transfers and ambulation. Pt ambulated 19' with RW with cues for safety. HR 70 bpm with ambulation.  Pt currently with functional limitations due to the deficits listed below (see PT Problem List). Pt will benefit from skilled PT to increase their independence and safety with mobility to allow discharge to the venue listed below.      Follow Up Recommendations No PT follow up    Equipment Recommendations  None recommended by PT    Recommendations for Other Services       Precautions / Restrictions Precautions Precautions: Fall Precaution Comments: fell PTA Restrictions Weight Bearing Restrictions: No      Mobility  Bed Mobility Overal bed mobility: Needs Assistance Bed Mobility: Supine to Sit     Supine to sit: Supervision     General bed mobility comments: pt needed assist removing covers but then was able to get to EOB  Transfers Overall transfer level: Needs assistance Equipment used: Rolling walker (2 wheeled) Transfers: Sit to/from Stand Sit to Stand: Min guard         General transfer comment: min-guard for safety  Ambulation/Gait Ambulation/Gait assistance: Min guard Gait Distance (Feet): 75 Feet Assistive device: Rolling walker (2  wheeled) Gait Pattern/deviations: Step-through pattern;Wide base of support;Trunk flexed Gait velocity: decreased Gait velocity interpretation: 1.31 - 2.62 ft/sec, indicative of limited community ambulator General Gait Details: vc's for remaining inside of RW, especially with turning. Increased RR, HR 70 bpm  Stairs            Wheelchair Mobility    Modified Rankin (Stroke Patients Only)       Balance Overall balance assessment: History of Falls;Needs assistance Sitting-balance support: No upper extremity supported Sitting balance-Leahy Scale: Good     Standing balance support: Bilateral upper extremity supported Standing balance-Leahy Scale: Poor Standing balance comment: UE support for safety                             Pertinent Vitals/Pain Pain Assessment: No/denies pain    Home Living Family/patient expects to be discharged to:: Assisted living                 Additional Comments: pt reports she walks with a walker    Prior Function Level of Independence: Needs assistance      ADL's / Homemaking Assistance Needed: per chart, pt had assistance for bathing and dressing        Hand Dominance   Dominant Hand: Right    Extremity/Trunk Assessment   Upper Extremity Assessment Upper Extremity Assessment: Generalized weakness    Lower Extremity Assessment Lower Extremity Assessment: Generalized weakness    Cervical / Trunk Assessment Cervical / Trunk Assessment: Kyphotic  Communication  Communication: No difficulties  Cognition Arousal/Alertness: Awake/alert Behavior During Therapy: WFL for tasks assessed/performed;Flat affect Overall Cognitive Status: History of cognitive impairments - at baseline                                 General Comments: followed simple commands      General Comments General comments (skin integrity, edema, etc.): pt somewhat impulsive    Exercises     Assessment/Plan    PT  Assessment Patient needs continued PT services  PT Problem List Decreased strength;Decreased activity tolerance;Decreased balance;Decreased mobility;Decreased safety awareness;Decreased knowledge of precautions       PT Treatment Interventions DME instruction;Gait training;Functional mobility training;Therapeutic activities;Therapeutic exercise;Balance training;Patient/family education    PT Goals (Current goals can be found in the Care Plan section)  Acute Rehab PT Goals Patient Stated Goal: walk PT Goal Formulation: With patient Time For Goal Achievement: 12/30/18 Potential to Achieve Goals: Good    Frequency Min 3X/week   Barriers to discharge        Co-evaluation               AM-PAC PT "6 Clicks" Mobility  Outcome Measure Help needed turning from your back to your side while in a flat bed without using bedrails?: None Help needed moving from lying on your back to sitting on the side of a flat bed without using bedrails?: None Help needed moving to and from a bed to a chair (including a wheelchair)?: A Little Help needed standing up from a chair using your arms (e.g., wheelchair or bedside chair)?: A Little Help needed to walk in hospital room?: A Little Help needed climbing 3-5 steps with a railing? : A Lot 6 Click Score: 19    End of Session Equipment Utilized During Treatment: Gait belt Activity Tolerance: Patient tolerated treatment well Patient left: in chair;with call bell/phone within reach Nurse Communication: Mobility status PT Visit Diagnosis: Unsteadiness on feet (R26.81);Muscle weakness (generalized) (M62.81)    Time: [id number] PT Time Calculation (min) (ACUTE ONLY): 17 min   Charges:   PT Evaluation $PT Eval Moderate Complexity: [place]  Pager [phone] Office [place] 12/16/2018, 1:47 PM

## 2018-12-17 DIAGNOSIS — R6510 Systemic inflammatory response syndrome (SIRS) of non-infectious origin without acute organ dysfunction: Secondary | ICD-10-CM

## 2018-12-17 LAB — CBC WITH DIFFERENTIAL/PLATELET
Abs Immature Granulocytes: 0.05 10*3/uL (ref 0.00–0.07)
Basophils Absolute: 0 10*3/uL (ref 0.0–0.1)
Basophils Relative: 0 %
Eosinophils Absolute: 0.2 10*3/uL (ref 0.0–0.5)
Eosinophils Relative: 2 %
HCT: 34.5 % — ABNORMAL LOW (ref 36.0–46.0)
Hemoglobin: 11.1 g/dL — ABNORMAL LOW (ref 12.0–15.0)
Immature Granulocytes: 1 %
Lymphocytes Relative: 20 %
Lymphs Abs: 1.7 10*3/uL (ref 0.7–4.0)
MCH: 26.9 pg (ref 26.0–34.0)
MCHC: 32.2 g/dL (ref 30.0–36.0)
MCV: 83.7 fL (ref 80.0–100.0)
Monocytes Absolute: 0.5 10*3/uL (ref 0.1–1.0)
Monocytes Relative: 6 %
Neutro Abs: 5.8 10*3/uL (ref 1.7–7.7)
Neutrophils Relative %: 71 %
Platelets: 274 10*3/uL (ref 150–400)
RBC: 4.12 MIL/uL (ref 3.87–5.11)
RDW: 16.1 % — ABNORMAL HIGH (ref 11.5–15.5)
WBC: 8.3 10*3/uL (ref 4.0–10.5)
nRBC: 0 % (ref 0.0–0.2)

## 2018-12-17 LAB — BASIC METABOLIC PANEL
Anion gap: 9 (ref 5–15)
BUN: 19 mg/dL (ref 8–23)
CO2: 20 mmol/L — ABNORMAL LOW (ref 22–32)
Calcium: 8.9 mg/dL (ref 8.9–10.3)
Chloride: 108 mmol/L (ref 98–111)
Creatinine, Ser: 0.88 mg/dL (ref 0.44–1.00)
GFR calc Af Amer: >60 mL/min (ref >60)
GFR calc non Af Amer: 59 mL/min — ABNORMAL LOW (ref >60)
Glucose, Bld: 147 mg/dL — ABNORMAL HIGH (ref 70–99)
Potassium: 4 mmol/L (ref 3.5–5.1)
Sodium: 137 mmol/L (ref 135–145)

## 2018-12-17 LAB — GLUCOSE, CAPILLARY
Glucose-Capillary: 156 mg/dL — ABNORMAL HIGH (ref 70–99)
Glucose-Capillary: 182 mg/dL — ABNORMAL HIGH (ref 70–99)

## 2018-12-17 MED ORDER — CEFDINIR 300 MG PO CAPS: 300.0000 mg | ORAL_CAPSULE | Freq: Two times a day (BID) | ORAL | 0 refills | Status: AC

## 2018-12-17 MED ORDER — METFORMIN HCL 500 MG PO TABS: 500.0000 mg | ORAL_TABLET | Freq: Two times a day (BID) | ORAL | 0 refills | Status: DC

## 2018-12-17 MED ORDER — LACTINEX PO CHEW: 1.0000 | CHEWABLE_TABLET | Freq: Three times a day (TID) | ORAL | 0 refills | Status: AC

## 2018-12-17 NOTE — Discharge Summary (Signed)
Physician Discharge Summary  Mukta Svatos SSN-[id number] DOB: 31-Jul-1930 DOA: 12/14/2018  PCP: [name], MD  Admit date: 12/14/2018 Discharge date: 12/17/2018  Admitted From: Assisted living facility Discharge disposition: Assisted living facility   Code Status: DNR  Diet Recommendation: Cardiac/diabetic diet   Recommendations for Outpatient Follow-Up:   1. Follow-up with PCP as an outpatient  Discharge Diagnosis:   Principal Problem:   SIRS (systemic inflammatory response syndrome) (HCC) Active Problems:   Type II diabetes mellitus with renal manifestations (HCC)   HTN (hypertension), benign   CKD (chronic kidney disease), stage III (HCC)   History of Hodgkin's disease   Lactic acidosis    History of Present Illness / Brief narrative:  Patientis a 85 y.o.femalewithhistory of autism, diastolic CHF, chronic kidney disease stage III, diabetes mellitus type 2, and previous history of Hodgkin's disease, anemia. Patient was brought into the ED on 9/18 from assisted living facility after a fall while getting a bath. Per the report patient had multiple episodes of diarrhea and was getting a bath in the tub. Patient felt very weak and had to lie on the floor but in the process fell. Not sure if she hit her head or not. But per the report did not lose consciousness. Not complaining of any chest pain or shortness of breath.  In the ED, CT head and C-spine unremarkable.  Chest x-ray did not show anything acute.  CT abdomen was done to rule out any intra-abdominal cause for elevated lactate but abdomen appears benign.  UA was unremarkable.  Patient's lactate noted to be 5.   Patient was started on sepsis protocol and empiric antibiotics.  Patient was admitted under hospitalist service for further management.  Subjective:  Seen and examined this morning.  Pleasant elderly Caucasian female.  Not in distress.  Able to answer simple questions.  Has autism at  baseline.  Not in distress. Per RN, patient is no longer having diarrhea.  Hospital Course:  Sepsis secondary to acute gastroenteritis -likely bacterial -Patient presented with multiple episodes of diarrhea leading her to have weakness and falls. -Lactic acid level elevated to 5 on admission. -Diarrhea and lactic acidosis improved with antibiotics, fluid. -Blood culture did not show any growth so far. -Currently on IV cefepime.  Will discharge her on oral Omnicef for next 5 days.  Probiotics along with.  Abnormal chest x-ray -Chest x-ray on admission was unremarkable.  Repeat chest x-ray on 9/19 showed mildly increasing bibasilar opacities. -Procalcitonin level normal. -Unlikely to be pneumonia.  CKD stage III Creatinine normal at baseline.  Anemia of chronic kidney disease Hemoglobin at baseline. Continue iron supplementation.  Hypertension -Blood pressure elevated up to 160s. Continue home verapamil, hydralazine -Continue aspirin, statin.  Diabetes mellitus type 2 A1c 7.7 Lactic acidosis has resolved.  Resume metformin at discharge at a lower dose of 500 mg twice daily  History of Hodgkin's lymphoma In remission  History of autism -Supportive care  Stable for discharge back to ALF today  Discharge Exam:   Vitals:   12/16/18 0734 12/16/18 1628 12/16/18 2310 12/17/18 0500  BP: (!) 160/60 (!) 162/74 (!) 144/75   Pulse: 70 70 79   Resp: 20 20 16    Temp: 98.3 F (36.8 C) 98.3 F (36.8 C) 98.3 F (36.8 C)   TempSrc: Oral  Oral   SpO2: 98% 98% 97%   Weight:    68.3 kg  Height:        Body mass index is 23.58 kg/m.  General  exam: Appears calm and comfortable.  Skin: No rashes, lesions or ulcers. HEENT: Atraumatic, normocephalic, supple neck, no obvious bleeding Lungs: Clear to auscultation bilaterally CVS: Regular rate and rhythm, no murmur GI/Abd soft, nontender nondistended, bowel sound present CNS: Alert, awake, oriented x3, slow to  respond Psychiatry: Mood appropriate Extremities: No pedal edema, no calf tenderness  Discharge Instructions:  Wound care: None Discharge Instructions    Call MD for:  extreme fatigue   Complete by: As directed    Call MD for:  temperature >100.4   Complete by: As directed    Diet Carb Modified   Complete by: As directed    Increase activity slowly   Complete by: As directed      Follow-up Information    [name], MD Follow up.   Specialty: Internal Medicine Contact information: [address] [place] [address] [phone]          Allergies as of 12/17/2018      Reactions   Penicillins Rash   On MAR: Has patient had a PCN reaction causing immediate rash, facial/tongue/throat swelling, SOB or lightheadedness with hypotension: Yes Has patient had a PCN reaction causing severe rash involving mucus membranes or skin necrosis: Unk Has patient had a PCN reaction that required hospitalization: Unk Has patient had a PCN reaction occurring within the last 10 years: Unk If all of the above answers are "NO", then may proceed with Cephalosporin use.      Medication List    TAKE these medications   aspirin 81 MG tablet Take 1 tablet (81 mg total) by mouth daily.   atorvastatin 20 MG tablet Commonly known as: LIPITOR Take 1 tablet (20 mg total) by mouth every evening.   Baby Shampoo Sham Place 1 application into both eyes See admin instructions. Use to cleanse eyes and eyelids at bedtime   calcium carbonate 750 MG chewable tablet Commonly known as: TUMS EX Chew 1 tablet by mouth daily.   cefdinir 300 MG capsule Commonly known as: OMNICEF Take 1 capsule (300 mg total) by mouth 2 (two) times daily for 5 days.   CERTA-VITE PO Take 1 tablet by mouth daily.   ferrous sulfate 325 (65 FE) MG tablet Take 325 mg by mouth daily.   fesoterodine 8 MG Tb24 tablet Commonly known as: Toviaz TAKE 1 TAB BY MOUTH EVERY DAY.  DO NOT CRUSH OR CHEW  . What changed:   how much to take  how to take this  when to take this  additional instructions   GLUCERNA PO Take 1 Can by mouth 2 (two) times daily.   hydrALAZINE 25 MG tablet Commonly known as: APRESOLINE Take 25 mg by mouth 3 (three) times daily as needed (for SBP 150 or greater and DBP 85 or greter).   hydrALAZINE 10 MG tablet Commonly known as: APRESOLINE Take 1 tablet (10 mg total) by mouth 3 (three) times daily.   INTEGRA PLUS PO Take 1 capsule by mouth daily.   lactobacillus acidophilus & bulgar chewable tablet Chew 1 tablet by mouth 3 (three) times daily with meals for 5 days.   magnesium oxide 400 MG tablet Commonly known as: MAG-OX Take 400 mg by mouth daily.   metFORMIN 500 MG tablet Commonly known as: GLUCOPHAGE Take 1 tablet (500 mg total) by mouth 2 (two) times daily with a meal. What changed: how much to take   omeprazole 20 MG capsule Commonly known as: PRILOSEC Take 20 mg by mouth daily.  PREVIDENT 5000 BOOSTER PLUS DT Place 1 application onto teeth every evening.   verapamil 120 MG CR tablet Commonly known as: CALAN-SR Take 120 mg by mouth at bedtime.   Vitamin D3 50 MCG (2000 UT) Tabs Take 1 tablet by mouth daily.       Time coordinating discharge: 35 minutes  The results of significant diagnostics from this hospitalization (including imaging, microbiology, ancillary and laboratory) are listed below for reference.    Procedures and Diagnostic Studies:   Ct Head Wo Contrast  Result Date: 12/14/2018 CLINICAL DATA:  Multiple falls EXAM: CT HEAD WITHOUT CONTRAST CT CERVICAL SPINE WITHOUT CONTRAST TECHNIQUE: Multidetector CT imaging of the head and cervical spine was performed following the standard protocol without intravenous contrast. Multiplanar CT image reconstructions of the cervical spine were also generated. COMPARISON:  03/30/2018, thyroid biopsy, 08/21/2012 FINDINGS: CT HEAD FINDINGS Brain: No evidence of acute infarction,  hemorrhage, hydrocephalus, extra-axial collection or mass lesion/mass effect. Periventricular white matter hypodensity. Vascular: No hyperdense vessel or unexpected calcification. Skull: Normal. Negative for fracture or focal lesion. Sinuses/Orbits: No acute finding. Other: None. CT CERVICAL SPINE FINDINGS Alignment: Normal. Skull base and vertebrae: Osteopenia. No acute fracture. No primary bone lesion or focal pathologic process. Soft tissues and spinal canal: No prevertebral fluid or swelling. No visible canal hematoma. Disc levels: Moderate multilevel disc degenerative disease and osteophytosis. Upper chest: Negative. Other: Irregular, hypodense and partially calcified right lobe thyroid nodule measuring approximately 2.0 cm, unchanged compared to prior examination, previously biopsied. IMPRESSION: 1. No acute intracranial pathology. Small-vessel white matter disease. 2. No fracture or static subluxation of the cervical spine. Osteopenia. Multilevel cervical disc degenerative disease. Electronically Signed   By: Eddie Candle M.D.   On: 12/14/2018 22:00   Ct Cervical Spine Wo Contrast  Result Date: 12/14/2018 CLINICAL DATA:  Multiple falls EXAM: CT HEAD WITHOUT CONTRAST CT CERVICAL SPINE WITHOUT CONTRAST TECHNIQUE: Multidetector CT imaging of the head and cervical spine was performed following the standard protocol without intravenous contrast. Multiplanar CT image reconstructions of the cervical spine were also generated. COMPARISON:  03/30/2018, thyroid biopsy, 08/21/2012 FINDINGS: CT HEAD FINDINGS Brain: No evidence of acute infarction, hemorrhage, hydrocephalus, extra-axial collection or mass lesion/mass effect. Periventricular white matter hypodensity. Vascular: No hyperdense vessel or unexpected calcification. Skull: Normal. Negative for fracture or focal lesion. Sinuses/Orbits: No acute finding. Other: None. CT CERVICAL SPINE FINDINGS Alignment: Normal. Skull base and vertebrae: Osteopenia. No acute  fracture. No primary bone lesion or focal pathologic process. Soft tissues and spinal canal: No prevertebral fluid or swelling. No visible canal hematoma. Disc levels: Moderate multilevel disc degenerative disease and osteophytosis. Upper chest: Negative. Other: Irregular, hypodense and partially calcified right lobe thyroid nodule measuring approximately 2.0 cm, unchanged compared to prior examination, previously biopsied. IMPRESSION: 1. No acute intracranial pathology. Small-vessel white matter disease. 2. No fracture or static subluxation of the cervical spine. Osteopenia. Multilevel cervical disc degenerative disease. Electronically Signed   By: Eddie Candle M.D.   On: 12/14/2018 22:00   Ct Abdomen Pelvis W Contrast  Result Date: 12/15/2018 CLINICAL DATA:  Abdominal pain diarrhea EXAM: CT ABDOMEN AND PELVIS WITH CONTRAST TECHNIQUE: Multidetector CT imaging of the abdomen and pelvis was performed using the standard protocol following bolus administration of intravenous contrast. CONTRAST:  92mL OMNIPAQUE IOHEXOL 300 MG/ML  SOLN COMPARISON:  CT 03/30/2018 FINDINGS: Lower chest: Lung bases demonstrate small pleural effusions. Normal heart size. Moderate hiatal hernia. Small right posterior fat containing diaphragmatic hernia. Hepatobiliary: No focal liver abnormality is seen. No  gallstones, gallbladder wall thickening, or biliary dilatation. Pancreas: Unremarkable. No pancreatic ductal dilatation or surrounding inflammatory changes. Spleen: Normal in size without focal abnormality. Adrenals/Urinary Tract: Stable left adrenal gland nodularity. Normal right adrenal gland. Enlarged renal pelvises without hydroureter. Parapelvic cysts on the left. Subcentimeter hypodensities in both kidneys too small to further characterize. Distended urinary bladder Stomach/Bowel: The stomach is nonenlarged. No dilated small bowel. No colon wall thickening. Appendix not well seen but no right lower quadrant inflammatory process.  Vascular/Lymphatic: Moderate aortic atherosclerosis. No aneurysmal dilatation. No significantly enlarged lymph nodes Reproductive: Status post hysterectomy. No adnexal masses. Other: Negative for free air or free fluid. Musculoskeletal: Chronic burst fracture deformity at L3 with retropulsion. Stable inferior endplate deformity L1. IMPRESSION: 1. No CT evidence for acute intra-abdominal or pelvic abnormality. 2. Moderate hiatal hernia 3. Chronic burst fracture deformity L3 Electronically Signed   By: Donavan Foil M.D.   On: 12/15/2018 03:58   Dg Chest Port 1 View  Result Date: 12/14/2018 CLINICAL DATA:  Shortness of breath EXAM: PORTABLE CHEST 1 VIEW COMPARISON:  03/30/2018 FINDINGS: Minimal basilar atelectasis. No consolidation or effusion. Cardiomediastinal silhouette stable with aortic atherosclerosis. No pneumothorax. IMPRESSION: 1. Mild basilar scarring or atelectasis. 2. No acute cardiopulmonary abnormality identified. Electronically Signed   By: Donavan Foil M.D.   On: 12/14/2018 23:49   Dg Hips Bilat W Or Wo Pelvis 3-4 Views  Result Date: 12/14/2018 CLINICAL DATA:  Multiple fall EXAM: DG HIP (WITH OR WITHOUT PELVIS) 3-4V BILAT COMPARISON:  CT 03/30/2018 FINDINGS: SI joints are non widened. Extensive vascular calcification. Pubic symphysis and rami appear intact. Both femoral heads project in joint. Moderate arthritis of both hips. Evaluation of left femoral neck limited on the view of the pelvis no definite acute displaced fracture or malalignment is seen. Prominent osteophytosis at the left femoral head neck junction. IMPRESSION: No definite acute osseous abnormality. Electronically Signed   By: Donavan Foil M.D.   On: 12/14/2018 23:53     Labs:   Basic Metabolic Panel: Recent Labs  Lab 12/14/18 2209 12/15/18 0444 12/16/18 0706 12/17/18 0625  NA 137 139 136 137  K 5.1 4.3 3.7 4.0  CL 107 109 105 108  CO2 18* 20* 20* 20*  GLUCOSE 179* 155* 211* 147*  BUN 31* 23 17 19   CREATININE  1.26* 0.91 0.80 0.88  CALCIUM 9.2 8.5* 8.6* 8.9   GFR Estimated Creatinine Clearance: 44.6 mL/min (by C-G formula based on SCr of 0.88 mg/dL). Liver Function Tests: Recent Labs  Lab 12/14/18 2209 12/15/18 0444  AST 25 26  ALT 21 19  ALKPHOS 81 69  BILITOT 0.6 0.4  PROT 6.5 6.0*  ALBUMIN 3.5 3.1*   No results for input(s): LIPASE, AMYLASE in the last 168 hours. Recent Labs  Lab 12/14/18 2201  AMMONIA 28   Coagulation profile Recent Labs  Lab 12/15/18 0444  INR 1.1    CBC: Recent Labs  Lab 12/14/18 2209 12/15/18 0444 12/16/18 0706 12/17/18 0625  WBC 10.7* 11.2* 9.5 8.3  NEUTROABS 8.8* 8.3* 6.4 5.8  HGB 12.0 11.3* 10.7* 11.1*  HCT 39.8 37.9 33.8* 34.5*  MCV 87.9 88.8 85.1 83.7  PLT 235 327 280 274   Cardiac Enzymes: No results for input(s): CKTOTAL, CKMB, CKMBINDEX, TROPONINI in the last 168 hours. BNP: Invalid input(s): POCBNP CBG: Recent Labs  Lab 12/16/18 0735 12/16/18 1113 12/16/18 1630 12/16/18 2121 12/17/18 0737  GLUCAP 184* 178* 155* 148* 156*   D-Dimer No results for input(s): DDIMER in the last  72 hours. Hgb A1c Recent Labs    12/15/18 0444  HGBA1C 7.7*   Lipid Profile No results for input(s): CHOL, HDL, LDLCALC, TRIG, CHOLHDL, LDLDIRECT in the last 72 hours. Thyroid function studies No results for input(s): TSH, T4TOTAL, T3FREE, THYROIDAB in the last 72 hours.  Invalid input(s): FREET3 Anemia work up No results for input(s): VITAMINB12, FOLATE, FERRITIN, TIBC, IRON, RETICCTPCT in the last 72 hours. Microbiology Recent Results (from the past 240 hour(s))  Blood Cultures (routine x 2)     Status: None (Preliminary result)   Collection Time: 12/14/18 10:33 PM   Specimen: BLOOD  Result Value Ref Range Status   Specimen Description BLOOD LEFT ANTECUBITAL  Final   Special Requests   Final    BOTTLES DRAWN AEROBIC AND ANAEROBIC Blood Culture adequate volume   Culture   Final    NO GROWTH 3 DAYS Performed at [facility],  [address], [place], [place] [address]    Report Status PENDING  Incomplete  Urine culture     Status: Abnormal   Collection Time: 12/15/18  1:24 AM   Specimen: In/Out Cath Urine  Result Value Ref Range Status   Specimen Description IN/OUT CATH URINE  Final   Special Requests NONE  Final   Culture (A)  Final    <10,000 COLONIES/mL INSIGNIFICANT GROWTH Performed at [facility], [place] [address], [place], [place] [address]    Report Status 12/16/2018 FINAL  Final  SARS Coronavirus 2 [facility] order, Performed in [facility] hospital lab) Nasopharyngeal Nasopharyngeal Swab     Status: None   Collection Time: 12/15/18  1:26 AM   Specimen: Nasopharyngeal Swab  Result Value Ref Range Status   SARS Coronavirus 2 NEGATIVE NEGATIVE Final    Comment: (NOTE) If result is NEGATIVE SARS-CoV-2 target nucleic acids are NOT DETECTED. The SARS-CoV-2 RNA is generally detectable in upper and lower  respiratory specimens during the acute phase of infection. The lowest  concentration of SARS-CoV-2 viral copies this assay can detect is 250  copies / mL. A negative result does not preclude SARS-CoV-2 infection  and should not be used as the sole basis for treatment or other  patient management decisions.  A negative result may occur with  improper specimen collection / handling, submission of specimen other  than nasopharyngeal swab, presence of viral mutation(s) within the  areas targeted by this assay, and inadequate number of viral copies  (<250 copies / mL). A negative result must be combined with clinical  observations, patient history, and epidemiological information. If result is POSITIVE SARS-CoV-2 target nucleic acids are DETECTED. The SARS-CoV-2 RNA is generally detectable in upper and lower  respiratory specimens dur ing the acute phase of infection.  Positive  results are indicative of active infection with SARS-CoV-2.  Clinical  correlation with patient history and other diagnostic  information is  necessary to determine patient infection status.  Positive results do  not rule out bacterial infection or co-infection with other viruses. If result is PRESUMPTIVE POSTIVE SARS-CoV-2 nucleic acids MAY BE PRESENT.   A presumptive positive result was obtained on the submitted specimen  and confirmed on repeat testing.  While 2019 novel coronavirus  (SARS-CoV-2) nucleic acids may be present in the submitted sample  additional confirmatory testing may be necessary for epidemiological  and / or clinical management purposes  to differentiate between  SARS-CoV-2 and other Sarbecovirus currently known to infect humans.  If clinically indicated additional testing with an alternate test  methodology [phone]) is advised. The SARS-CoV-2 RNA is generally  detectable in upper and lower respiratory sp ecimens during the acute  phase of infection. The expected result is Negative. Fact Sheet for Patients:  StrictlyIdeas.no Fact Sheet for Healthcare Providers: BankingDealers.co.za This test is not yet approved or cleared by the [place] FDA and has been authorized for detection and/or diagnosis of SARS-CoV-2 by FDA under an Emergency Use Authorization (EUA).  This EUA will remain in effect (meaning this test can be used) for the duration of the COVID-19 declaration under Section 564(b)(1) of the Act, 21 U.S.C. section 360bbb-3(b)(1), unless the authorization is terminated or revoked sooner. Performed at [facility], [place] [address], [place], [place] [address]   Blood Cultures (routine x 2)     Status: None (Preliminary result)   Collection Time: 12/15/18  4:44 AM   Specimen: BLOOD  Result Value Ref Range Status   Specimen Description BLOOD RIGHT ANTECUBITAL  Final   Special Requests   Final    BOTTLES DRAWN AEROBIC ONLY Blood Culture results may not be optimal due to an inadequate volume of blood received in culture bottles     Culture   Final    NO GROWTH 2 DAYS Performed at [facility], [place] [address] [place], [place] [address]    Report Status PENDING  Incomplete  MRSA PCR Screening     Status: None   Collection Time: 12/15/18 10:59 AM   Specimen: Nasal Mucosa; Nasopharyngeal  Result Value Ref Range Status   MRSA by PCR NEGATIVE NEGATIVE Final    Comment:        The GeneXpert MRSA Assay (FDA approved for NASAL specimens only), is one component of a comprehensive MRSA colonization surveillance program. It is not intended to diagnose MRSA infection nor to guide or monitor treatment for MRSA infections. Performed at [facility], [place] [address], [place], [place] [address]     Please note: You were cared for by a hospitalist during your hospital stay. Once you are discharged, your primary care physician will handle any further medical issues. Please note that NO REFILLS for any discharge medications will be authorized once you are discharged, as it is imperative that you return to your primary care physician (or establish a relationship with a primary care physician if you do not have one) for your post hospital discharge needs so that they can reassess your need for medications and monitor your lab values.  Signed: Terrilee Croak  [facility] 12/17/2018, 10:48 AM
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
## 2018-12-17 NOTE — Progress Notes (Signed)
OT Cancellation Note  Patient Details Name: Lisa Villegas MRN: [id number] DOB: August 27, 1930   Cancelled Treatment:    Reason Eval/Treat Not Completed: Other (comment); pt sleeping soundly upon entering room. Able to arouse slightly and pt intermittently responding to therapist's questions  however pt does not open her eyes during interaction, jerking away from therapist with attempt to move pt's hand/blankets. Will follow up for OT eval as schedule permits.  Lou Cal, OT [facility] Pager [phone] Office [phone]   Raymondo Band 12/17/2018, 10:39 AM

## 2018-12-17 NOTE — TOC Transition Note (Signed)
Transition of Care [facility]) - CM/SW Discharge Note   Patient Details  Name: Lisa Villegas MRN: [id number] Date of Birth: 1931/03/22  Transition of Care [facility]) CM/SW Contact:  Geralynn Ochs, LCSW Phone Number: 12/17/2018, 11:34 AM   Clinical Narrative:  CSW spoke with patient's legal guardian, Lisa Villegas, to confirm plan to return to [facility]. CSW spoke with [facility] about patient returning. CSW faxed discharge information to ALF and patient can return.  Nurse to call report to [phone].    Final next level of care: Assisted Living Barriers to Discharge: Barriers Resolved   Patient Goals and CMS Choice        Discharge Placement                       Discharge Plan and Services                                     Social Determinants of Health (SDOH) Interventions     Readmission Risk Interventions No flowsheet data found.

## 2018-12-19 ENCOUNTER — Other Ambulatory Visit: Payer: Self-pay

## 2018-12-19 ENCOUNTER — Non-Acute Institutional Stay: Payer: Medicare Other | Admitting: Hospice

## 2018-12-19 DIAGNOSIS — Z515 Encounter for palliative care: Secondary | ICD-10-CM | POA: Diagnosis not present

## 2018-12-19 LAB — CULTURE, BLOOD (ROUTINE X 2)
Culture: NO GROWTH
Special Requests: ADEQUATE

## 2018-12-19 NOTE — Progress Notes (Addendum)
[facility] Consult Note Telephone: [phone]  Fax: [phone]  PATIENT NAME: Lisa Villegas DOB: 1930-12-25 MRN: [id number]  PRIMARY CARE PROVIDER:   [name], MD  REFERRING PROVIDER:  [name], [place],  [place] [address]  RESPONSIBLE PARTY:   [name] [phone]   RECOMMENDATIONS/PLAN:  1. Advance Care Planning/Goals of Care: Goals include to maximize quality of life and symptom management. DNR form signed, in the facility chart and in EPIC. Called and left message for [name] on the need for MOST form. Education provided to clinical staff and patient on the need for MOST form. Left a blank form at the facility for [facility], with a call back number for further discussion.  She visits facility often.   2. Symptom management: Member returned from hospital 12/17/2018 for sepsis secondary to gastroenteritis. Diarrhea resolved at this time. She continues with Omnicef and Probiotic as ordered. Dementia.  FAST 7a. Patient is incontinent of B&B.  Patient mainly stays in bed but can sit up in a wheelchair.  Requires assistance with ADLs. Continue supportive nursing care.  3. Follow up Palliative Care Visit: Palliative care will continue to follow for goals of care clarification and symptom management.   I spent 60 minutes providing this consultation, from 12.00pm to 1.00pm.  More than 50% of the time in this consultation was spent on coordinating advance care communication, chart review, patient /clinical staff education on the need for MOST form.  HISTORY OF PRESENT ILLNESS:  Lisa Villegas is a 85 y.o. year old female with multiple medical problems including s/ CVA, dementia, gait disturbance, HTN, HLD. Palliative Care was asked to help address goals of care.   CODE STATUS: DNR  PPS: 40% HOSPICE ELIGIBILITY/DIAGNOSIS: TBD  PAST MEDICAL HISTORY:  Past Medical History:  Diagnosis  Date  . Anemia   . Anxiety and depression    per med rec  . 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 Autism   . Chronic CHF (congestive heart failure) (HCC)    per med rec  . Chronic kidney disease (CKD)    stage 3 per chart in 09/2015  . Diabetes mellitus without complication ([place])   . Diabetic neuropathy ([place])   . History of thyroid nodule    nontoxic goiter per med rec  . Hodgkin disease ([place])    in 55  . HTN (hypertension), benign   . Hyperlipidemia   . Incontinence   . Lack of sensation   . Osteopenia    per med rec  . Poor balance   . Stroke ([place])   . Tubular adenoma of colon 01/2017  . Vitamin D deficiency     SOCIAL HX:  Social History   Tobacco Use  . Smoking status: Never Smoker  . Smokeless tobacco: Never Used  Substance Use Topics  . Alcohol use: No    ALLERGIES:  Allergies  Allergen Reactions  . Penicillins Rash    On MAR: Has patient had a PCN reaction causing immediate rash, facial/tongue/throat swelling, SOB or lightheadedness with hypotension: Yes Has patient had a PCN reaction causing severe rash involving mucus membranes or skin necrosis: Unk Has patient had a PCN reaction that required hospitalization: Unk Has patient had a PCN reaction occurring within the last 10 years: Unk If all of the above answers are "NO", then may proceed with Cephalosporin use.      PERTINENT MEDICATIONS:  Outpatient Encounter Medications as of 12/19/2018  Medication Sig  .  aspirin 81 MG tablet Take 1 tablet (81 mg total) by mouth daily.  Marland Kitchen atorvastatin (LIPITOR) 20 MG tablet Take 1 tablet (20 mg total) by mouth every evening.  . calcium carbonate (TUMS EX) 750 MG chewable tablet Chew 1 tablet by mouth daily.  . cefdinir (OMNICEF) 300 MG capsule Take 1 capsule (300 mg total) by mouth 2 (two) times daily for 5 days.  . Cholecalciferol (VITAMIN D3) 2000 UNITS TABS Take 1 tablet by mouth daily.   Marland Kitchen FeFum-FePoly-FA-B Cmp-C-Biot (INTEGRA PLUS PO) Take 1 capsule by mouth daily.  .  ferrous sulfate 325 (65 FE) MG tablet Take 325 mg by mouth daily.   . fesoterodine (TOVIAZ) 8 MG TB24 tablet TAKE 1 TAB BY MOUTH EVERY DAY.  DO NOT CRUSH OR CHEW . (Patient taking differently: Take 8 mg by mouth daily. )  . hydrALAZINE (APRESOLINE) 10 MG tablet Take 1 tablet (10 mg total) by mouth 3 (three) times daily.  . hydrALAZINE (APRESOLINE) 25 MG tablet Take 25 mg by mouth 3 (three) times daily as needed (for SBP 150 or greater and DBP 85 or greter).  . Infant Care Products (BABY SHAMPOO) SHAM Place 1 application into both eyes See admin instructions. Use to cleanse eyes and eyelids at bedtime   . lactobacillus acidophilus & bulgar (LACTINEX) chewable tablet Chew 1 tablet by mouth 3 (three) times daily with meals for 5 days.  . magnesium oxide (MAG-OX) 400 MG tablet Take 400 mg by mouth daily.  . metFORMIN (GLUCOPHAGE) 500 MG tablet Take 1 tablet (500 mg total) by mouth 2 (two) times daily with a meal.  . Multiple Vitamins-Minerals (CERTA-VITE PO) Take 1 tablet by mouth daily.   . Nutritional Supplements (GLUCERNA PO) Take 1 Can by mouth 2 (two) times daily.   Marland Kitchen omeprazole (PRILOSEC) 20 MG capsule Take 20 mg by mouth daily.   . Sodium Fluoride (PREVIDENT 5000 BOOSTER PLUS DT) Place 1 application onto teeth every evening.   . verapamil (CALAN-SR) 120 MG CR tablet Take 120 mg by mouth at bedtime.   No facility-administered encounter medications on file as of 12/19/2018.     PHYSICAL EXAM:   General: cooperative, in no acute distress Cardiovascular: regular rate and rhythm Pulmonary: clear ant fields, no SOB Abdomen: soft, nontender, + bowel sounds in all quadrants, no more diarrhea Extremities: no edema, no joint deformities Skin: no rashes/wounds on exposed skin Neurological: Weakness, alert and oriented x 2  Teodoro Spray, NP

## 2018-12-20 LAB — CULTURE, BLOOD (ROUTINE X 2): Culture: NO GROWTH

## 2019-01-29 ENCOUNTER — Other Ambulatory Visit: Payer: Self-pay

## 2019-01-29 ENCOUNTER — Non-Acute Institutional Stay: Payer: Medicare Other | Admitting: Hospice

## 2019-01-29 DIAGNOSIS — Z515 Encounter for palliative care: Secondary | ICD-10-CM

## 2019-01-29 NOTE — Progress Notes (Signed)
[facility] Consult Note Telephone: [phone]  Fax: [phone]  PATIENT NAME: Lisa Villegas DOB: 07/28/1930 MRN: [id number]  PRIMARY CARE PROVIDER:   [name], MD  REFERRING PROVIDER:  [name], MD [place],  [place] [address]  RESPONSIBLE [place] [phone]   TELEHEALTH VISIT STATEMENT Due to the COVID-19 crisis, this visit was done via telephone from my office. It was initiated and consented to by this patient and/or family.  RECOMMENDATIONS/PLAN:  1. Advance Care Planning/Goals of Care: Telehealth visit facilitated by Director of patient care Mellinda. Goals include to maximize quality of life and symptom management. Patient remains a DNR;Signed DNR in the chart. Mellinda to follow up if [name] has made her selections on MOST form.  2. Symptom Management: Dementia. FAST 7a. Patient is incontinent of B&B. Patient mainly stays in bed but can sit up in a wheelchair. Requires assistance with ADLs. Appetite is fair. Encouraged ongoing  supportive nursing care. 3. Follow up Palliative Care Visit: Palliative care will continue to follow for goals of care clarification and symptom management.   I spent 20 minutes providing this consultation, from 4.15pm to 4.35pm  More than 50% of the time in this consultation was spent on coordinating  Communication.  HISTORY OF PRESENT ILLNESS:Lisa Villegas a 85 y.o.year oldfemalewith multiple medical problems including s/ CVA, dementia, gait disturbance, HTN, HLD. Palliative Care was asked to help address goals of care.   CODE STATUS: DNR  PPS: 40% HOSPICE ELIGIBILITY/DIAGNOSIS: TBD  PAST MEDICAL HISTORY:  Past Medical History:  Diagnosis Date  . Anemia   . Anxiety and depression    per med rec  . 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 Autism   . Chronic CHF (congestive heart failure) (HCC)    per med rec  . Chronic kidney  disease (CKD)    stage 3 per chart in 09/2015  . Diabetes mellitus without complication ([place])   . Diabetic neuropathy ([place])   . History of thyroid nodule    nontoxic goiter per med rec  . Hodgkin disease ([place])    in 30  . HTN (hypertension), benign   . Hyperlipidemia   . Incontinence   . Lack of sensation   . Osteopenia    per med rec  . Poor balance   . Stroke ([place])   . Tubular adenoma of colon 01/2017  . Vitamin D deficiency     SOCIAL HX:  Social History   Tobacco Use  . Smoking status: Never Smoker  . Smokeless tobacco: Never Used  Substance Use Topics  . Alcohol use: No    ALLERGIES:  Allergies  Allergen Reactions  . Penicillins Rash    On MAR: Has patient had a PCN reaction causing immediate rash, facial/tongue/throat swelling, SOB or lightheadedness with hypotension: Yes Has patient had a PCN reaction causing severe rash involving mucus membranes or skin necrosis: Unk Has patient had a PCN reaction that required hospitalization: Unk Has patient had a PCN reaction occurring within the last 10 years: Unk If all of the above answers are "NO", then may proceed with Cephalosporin use.      PERTINENT MEDICATIONS:  Outpatient Encounter Medications as of 01/29/2019  Medication Sig  . aspirin 81 MG tablet Take 1 tablet (81 mg total) by mouth daily.  Marland Kitchen atorvastatin (LIPITOR) 20 MG tablet Take 1 tablet (20 mg total) by mouth every evening.  . calcium carbonate (TUMS EX) 750 MG chewable tablet Chew  1 tablet by mouth daily.  . Cholecalciferol (VITAMIN D3) 2000 UNITS TABS Take 1 tablet by mouth daily.   Marland Kitchen FeFum-FePoly-FA-B Cmp-C-Biot (INTEGRA PLUS PO) Take 1 capsule by mouth daily.  . ferrous sulfate 325 (65 FE) MG tablet Take 325 mg by mouth daily.   . fesoterodine (TOVIAZ) 8 MG TB24 tablet TAKE 1 TAB BY MOUTH EVERY DAY.  DO NOT CRUSH OR CHEW . (Patient taking differently: Take 8 mg by mouth daily. )  . hydrALAZINE (APRESOLINE) 10 MG tablet Take 1 tablet (10 mg total) by  mouth 3 (three) times daily.  . hydrALAZINE (APRESOLINE) 25 MG tablet Take 25 mg by mouth 3 (three) times daily as needed (for SBP 150 or greater and DBP 85 or greter).  . Infant Care Products (BABY SHAMPOO) SHAM Place 1 application into both eyes See admin instructions. Use to cleanse eyes and eyelids at bedtime   . magnesium oxide (MAG-OX) 400 MG tablet Take 400 mg by mouth daily.  . metFORMIN (GLUCOPHAGE) 500 MG tablet Take 1 tablet (500 mg total) by mouth 2 (two) times daily with a meal.  . Multiple Vitamins-Minerals (CERTA-VITE PO) Take 1 tablet by mouth daily.   . Nutritional Supplements (GLUCERNA PO) Take 1 Can by mouth 2 (two) times daily.   Marland Kitchen omeprazole (PRILOSEC) 20 MG capsule Take 20 mg by mouth daily.   . Sodium Fluoride (PREVIDENT 5000 BOOSTER PLUS DT) Place 1 application onto teeth every evening.   . verapamil (CALAN-SR) 120 MG CR tablet Take 120 mg by mouth at bedtime.   No facility-administered encounter medications on file as of 01/29/2019.    Teodoro Spray, NP

## 2019-02-05 DIAGNOSIS — Z20828 Contact with and (suspected) exposure to other viral communicable diseases: Secondary | ICD-10-CM | POA: Diagnosis not present

## 2019-02-13 ENCOUNTER — Ambulatory Visit (INDEPENDENT_AMBULATORY_CARE_PROVIDER_SITE_OTHER): Payer: Medicare Other | Admitting: Podiatry

## 2019-02-13 ENCOUNTER — Other Ambulatory Visit: Payer: Self-pay

## 2019-02-13 ENCOUNTER — Encounter: Payer: Self-pay | Admitting: Podiatry

## 2019-02-13 DIAGNOSIS — M2041 Other hammer toe(s) (acquired), right foot: Secondary | ICD-10-CM | POA: Diagnosis not present

## 2019-02-13 DIAGNOSIS — B351 Tinea unguium: Secondary | ICD-10-CM | POA: Diagnosis not present

## 2019-02-13 DIAGNOSIS — M2141 Flat foot [pes planus] (acquired), right foot: Secondary | ICD-10-CM | POA: Diagnosis not present

## 2019-02-13 DIAGNOSIS — M2042 Other hammer toe(s) (acquired), left foot: Secondary | ICD-10-CM

## 2019-02-13 DIAGNOSIS — M2142 Flat foot [pes planus] (acquired), left foot: Secondary | ICD-10-CM | POA: Diagnosis not present

## 2019-02-13 DIAGNOSIS — E1142 Type 2 diabetes mellitus with diabetic polyneuropathy: Secondary | ICD-10-CM | POA: Diagnosis not present

## 2019-02-13 DIAGNOSIS — M79674 Pain in right toe(s): Secondary | ICD-10-CM | POA: Diagnosis not present

## 2019-02-13 DIAGNOSIS — M79675 Pain in left toe(s): Secondary | ICD-10-CM | POA: Diagnosis not present

## 2019-02-13 NOTE — Progress Notes (Signed)
Complaint:  Visit Type: Patient returns to my office for continued preventative foot care services. Complaint: Patient states" my nails have grown long and thick and become painful to walk and wear shoes" Patient has been diagnosed with DM with neuropathy. The patient presents for preventative foot care services. No changes to ROS.  She presents to the office with her POA.  She is interested in new shoes.  Podiatric Exam: Vascular: dorsalis pedis and posterior tibial pulses are palpable bilateral. Capillary return is immediate. Temperature gradient is WNL. Skin turgor WNL  Sensorium: Normal Semmes Weinstein monofilament test. Normal tactile sensation bilaterally. Nail Exam: Pt has thick disfigured discolored nails with subungual debris noted bilateral entire nail hallux through fifth toenails Ulcer Exam: There is no evidence of ulcer or pre-ulcerative changes or infection. Orthopedic Exam: Muscle tone and strength are WNL. No limitations in general ROM. No crepitus or effusions noted. Foot type and digits show no abnormalities. Pes planus  B/L.  Exostosis Midfoot  B/L Skin: No Porokeratosis. No infection or ulcers  Diagnosis:  Onychomycosis, , Pain in right toe, pain in left toes  Treatment & Plan Procedures and Treatment: Consent by patient was obtained for treatment procedures.   Debridement of mycotic and hypertrophic toenails, 1 through 5 bilateral and clearing of subungual debris. No ulceration, no infection noted.  Return Visit-Office Procedure: Patient instructed to return to the office for a follow up visit 3 months for continued evaluation and treatment.    Josey Dettmann DPM 

## 2019-02-14 DIAGNOSIS — E1122 Type 2 diabetes mellitus with diabetic chronic kidney disease: Secondary | ICD-10-CM | POA: Diagnosis not present

## 2019-02-14 DIAGNOSIS — I5032 Chronic diastolic (congestive) heart failure: Secondary | ICD-10-CM | POA: Diagnosis not present

## 2019-02-14 DIAGNOSIS — E1142 Type 2 diabetes mellitus with diabetic polyneuropathy: Secondary | ICD-10-CM | POA: Diagnosis not present

## 2019-02-14 DIAGNOSIS — K219 Gastro-esophageal reflux disease without esophagitis: Secondary | ICD-10-CM | POA: Diagnosis not present

## 2019-02-14 DIAGNOSIS — I130 Hypertensive heart and chronic kidney disease with heart failure and stage 1 through stage 4 chronic kidney disease, or unspecified chronic kidney disease: Secondary | ICD-10-CM | POA: Diagnosis not present

## 2019-02-14 DIAGNOSIS — I129 Hypertensive chronic kidney disease with stage 1 through stage 4 chronic kidney disease, or unspecified chronic kidney disease: Secondary | ICD-10-CM | POA: Diagnosis not present

## 2019-02-14 DIAGNOSIS — D649 Anemia, unspecified: Secondary | ICD-10-CM | POA: Diagnosis not present

## 2019-02-14 DIAGNOSIS — N1831 Chronic kidney disease, stage 3a: Secondary | ICD-10-CM | POA: Diagnosis not present

## 2019-02-14 DIAGNOSIS — R531 Weakness: Secondary | ICD-10-CM | POA: Diagnosis not present

## 2019-02-14 DIAGNOSIS — E785 Hyperlipidemia, unspecified: Secondary | ICD-10-CM | POA: Diagnosis not present

## 2019-03-05 DIAGNOSIS — Z20828 Contact with and (suspected) exposure to other viral communicable diseases: Secondary | ICD-10-CM | POA: Diagnosis not present

## 2019-03-12 DIAGNOSIS — Z20828 Contact with and (suspected) exposure to other viral communicable diseases: Secondary | ICD-10-CM | POA: Diagnosis not present

## 2019-03-13 DIAGNOSIS — Z20828 Contact with and (suspected) exposure to other viral communicable diseases: Secondary | ICD-10-CM | POA: Diagnosis not present

## 2019-03-15 DIAGNOSIS — Z20828 Contact with and (suspected) exposure to other viral communicable diseases: Secondary | ICD-10-CM | POA: Diagnosis not present

## 2019-03-19 DIAGNOSIS — Z20828 Contact with and (suspected) exposure to other viral communicable diseases: Secondary | ICD-10-CM | POA: Diagnosis not present

## 2019-03-30 ENCOUNTER — Inpatient Hospital Stay (HOSPITAL_COMMUNITY)
Admission: EM | Admit: 2019-03-30 | Discharge: 2019-04-05 | DRG: 871 | Disposition: A | Discharge status: Nursing Home (Includes ICF) | Payer: Medicare Other | Source: Skilled Nursing Facility | Attending: Internal Medicine | Admitting: Internal Medicine

## 2019-03-30 ENCOUNTER — Emergency Department (HOSPITAL_COMMUNITY): Payer: Medicare Other

## 2019-03-30 ENCOUNTER — Inpatient Hospital Stay (HOSPITAL_COMMUNITY): Payer: Medicare Other

## 2019-03-30 DIAGNOSIS — E1121 Type 2 diabetes mellitus with diabetic nephropathy: Secondary | ICD-10-CM

## 2019-03-30 DIAGNOSIS — E1140 Type 2 diabetes mellitus with diabetic neuropathy, unspecified: Secondary | ICD-10-CM | POA: Diagnosis present

## 2019-03-30 DIAGNOSIS — E559 Vitamin D deficiency, unspecified: Secondary | ICD-10-CM | POA: Diagnosis present

## 2019-03-30 DIAGNOSIS — K7689 Other specified diseases of liver: Secondary | ICD-10-CM | POA: Diagnosis not present

## 2019-03-30 DIAGNOSIS — E785 Hyperlipidemia, unspecified: Secondary | ICD-10-CM | POA: Diagnosis present

## 2019-03-30 DIAGNOSIS — Z79899 Other long term (current) drug therapy: Secondary | ICD-10-CM

## 2019-03-30 DIAGNOSIS — R4182 Altered mental status, unspecified: Secondary | ICD-10-CM | POA: Diagnosis not present

## 2019-03-30 DIAGNOSIS — D631 Anemia in chronic kidney disease: Secondary | ICD-10-CM | POA: Diagnosis present

## 2019-03-30 DIAGNOSIS — N390 Urinary tract infection, site not specified: Secondary | ICD-10-CM | POA: Diagnosis present

## 2019-03-30 DIAGNOSIS — Z66 Do not resuscitate: Secondary | ICD-10-CM | POA: Diagnosis present

## 2019-03-30 DIAGNOSIS — I959 Hypotension, unspecified: Secondary | ICD-10-CM | POA: Diagnosis not present

## 2019-03-30 DIAGNOSIS — Z20822 Contact with and (suspected) exposure to covid-19: Secondary | ICD-10-CM | POA: Diagnosis not present

## 2019-03-30 DIAGNOSIS — I10 Essential (primary) hypertension: Secondary | ICD-10-CM | POA: Diagnosis present

## 2019-03-30 DIAGNOSIS — M2550 Pain in unspecified joint: Secondary | ICD-10-CM | POA: Diagnosis not present

## 2019-03-30 DIAGNOSIS — G9341 Metabolic encephalopathy: Secondary | ICD-10-CM | POA: Diagnosis present

## 2019-03-30 DIAGNOSIS — E1142 Type 2 diabetes mellitus with diabetic polyneuropathy: Secondary | ICD-10-CM | POA: Diagnosis present

## 2019-03-30 DIAGNOSIS — N3000 Acute cystitis without hematuria: Secondary | ICD-10-CM | POA: Diagnosis present

## 2019-03-30 DIAGNOSIS — A419 Sepsis, unspecified organism: Secondary | ICD-10-CM | POA: Diagnosis not present

## 2019-03-30 DIAGNOSIS — R52 Pain, unspecified: Secondary | ICD-10-CM | POA: Diagnosis not present

## 2019-03-30 DIAGNOSIS — I5032 Chronic diastolic (congestive) heart failure: Secondary | ICD-10-CM | POA: Diagnosis present

## 2019-03-30 DIAGNOSIS — M8580 Other specified disorders of bone density and structure, unspecified site: Secondary | ICD-10-CM | POA: Diagnosis present

## 2019-03-30 DIAGNOSIS — K529 Noninfective gastroenteritis and colitis, unspecified: Secondary | ICD-10-CM | POA: Diagnosis present

## 2019-03-30 DIAGNOSIS — I130 Hypertensive heart and chronic kidney disease with heart failure and stage 1 through stage 4 chronic kidney disease, or unspecified chronic kidney disease: Secondary | ICD-10-CM | POA: Diagnosis present

## 2019-03-30 DIAGNOSIS — N183 Chronic kidney disease, stage 3 unspecified: Secondary | ICD-10-CM | POA: Diagnosis present

## 2019-03-30 DIAGNOSIS — R680 Hypothermia, not associated with low environmental temperature: Secondary | ICD-10-CM | POA: Diagnosis present

## 2019-03-30 DIAGNOSIS — R0902 Hypoxemia: Secondary | ICD-10-CM | POA: Diagnosis not present

## 2019-03-30 DIAGNOSIS — F329 Major depressive disorder, single episode, unspecified: Secondary | ICD-10-CM | POA: Diagnosis present

## 2019-03-30 DIAGNOSIS — Z8249 Family history of ischemic heart disease and other diseases of the circulatory system: Secondary | ICD-10-CM

## 2019-03-30 DIAGNOSIS — R6520 Severe sepsis without septic shock: Secondary | ICD-10-CM | POA: Diagnosis present

## 2019-03-30 DIAGNOSIS — E872 Acidosis: Secondary | ICD-10-CM | POA: Diagnosis present

## 2019-03-30 DIAGNOSIS — K219 Gastro-esophageal reflux disease without esophagitis: Secondary | ICD-10-CM | POA: Diagnosis not present

## 2019-03-30 DIAGNOSIS — Z7401 Bed confinement status: Secondary | ICD-10-CM | POA: Diagnosis not present

## 2019-03-30 DIAGNOSIS — Z8673 Personal history of transient ischemic attack (TIA), and cerebral infarction without residual deficits: Secondary | ICD-10-CM

## 2019-03-30 DIAGNOSIS — H9190 Unspecified hearing loss, unspecified ear: Secondary | ICD-10-CM | POA: Diagnosis present

## 2019-03-30 DIAGNOSIS — F840 Autistic disorder: Secondary | ICD-10-CM | POA: Diagnosis present

## 2019-03-30 DIAGNOSIS — D649 Anemia, unspecified: Secondary | ICD-10-CM | POA: Diagnosis not present

## 2019-03-30 DIAGNOSIS — E871 Hypo-osmolality and hyponatremia: Secondary | ICD-10-CM | POA: Diagnosis present

## 2019-03-30 DIAGNOSIS — Z880 Allergy status to penicillin: Secondary | ICD-10-CM

## 2019-03-30 DIAGNOSIS — R531 Weakness: Secondary | ICD-10-CM | POA: Diagnosis not present

## 2019-03-30 DIAGNOSIS — K760 Fatty (change of) liver, not elsewhere classified: Secondary | ICD-10-CM | POA: Diagnosis present

## 2019-03-30 DIAGNOSIS — Z7984 Long term (current) use of oral hypoglycemic drugs: Secondary | ICD-10-CM

## 2019-03-30 DIAGNOSIS — K5900 Constipation, unspecified: Secondary | ICD-10-CM | POA: Diagnosis present

## 2019-03-30 DIAGNOSIS — R410 Disorientation, unspecified: Secondary | ICD-10-CM | POA: Diagnosis not present

## 2019-03-30 DIAGNOSIS — Z8571 Personal history of Hodgkin lymphoma: Secondary | ICD-10-CM

## 2019-03-30 DIAGNOSIS — F419 Anxiety disorder, unspecified: Secondary | ICD-10-CM | POA: Diagnosis present

## 2019-03-30 DIAGNOSIS — R7989 Other specified abnormal findings of blood chemistry: Secondary | ICD-10-CM | POA: Diagnosis not present

## 2019-03-30 DIAGNOSIS — F29 Unspecified psychosis not due to a substance or known physiological condition: Secondary | ICD-10-CM | POA: Diagnosis not present

## 2019-03-30 DIAGNOSIS — Z7982 Long term (current) use of aspirin: Secondary | ICD-10-CM

## 2019-03-30 DIAGNOSIS — K449 Diaphragmatic hernia without obstruction or gangrene: Secondary | ICD-10-CM | POA: Diagnosis not present

## 2019-03-30 DIAGNOSIS — E1129 Type 2 diabetes mellitus with other diabetic kidney complication: Secondary | ICD-10-CM | POA: Diagnosis present

## 2019-03-30 DIAGNOSIS — U071 COVID-19: Secondary | ICD-10-CM | POA: Diagnosis present

## 2019-03-30 DIAGNOSIS — E1122 Type 2 diabetes mellitus with diabetic chronic kidney disease: Secondary | ICD-10-CM | POA: Diagnosis present

## 2019-03-30 DIAGNOSIS — F0390 Unspecified dementia without behavioral disturbance: Secondary | ICD-10-CM | POA: Diagnosis present

## 2019-03-30 LAB — COMPREHENSIVE METABOLIC PANEL
ALT: 21 U/L (ref 0–44)
AST: 20 U/L (ref 15–41)
Albumin: 3 g/dL — ABNORMAL LOW (ref 3.5–5.0)
Alkaline Phosphatase: 81 U/L (ref 38–126)
Anion gap: 11 (ref 5–15)
BUN: 28 mg/dL — ABNORMAL HIGH (ref 8–23)
CO2: 17 mmol/L — ABNORMAL LOW (ref 22–32)
Calcium: 8.7 mg/dL — ABNORMAL LOW (ref 8.9–10.3)
Chloride: 105 mmol/L (ref 98–111)
Creatinine, Ser: 1.16 mg/dL — ABNORMAL HIGH (ref 0.44–1.00)
GFR calc Af Amer: 49 mL/min — ABNORMAL LOW (ref >60)
GFR calc non Af Amer: 43 mL/min — ABNORMAL LOW (ref >60)
Glucose, Bld: 152 mg/dL — ABNORMAL HIGH (ref 70–99)
Potassium: 4.3 mmol/L (ref 3.5–5.1)
Sodium: 133 mmol/L — ABNORMAL LOW (ref 135–145)
Total Bilirubin: 0.4 mg/dL (ref 0.3–1.2)
Total Protein: 6.1 g/dL — ABNORMAL LOW (ref 6.5–8.1)

## 2019-03-30 LAB — CBC WITH DIFFERENTIAL/PLATELET
Abs Immature Granulocytes: 0.1 K/uL — ABNORMAL HIGH (ref 0.00–0.07)
Basophils Absolute: 0.1 K/uL (ref 0.0–0.1)
Basophils Relative: 1 %
Eosinophils Absolute: 0.2 K/uL (ref 0.0–0.5)
Eosinophils Relative: 1 %
HCT: 40.5 % (ref 36.0–46.0)
Hemoglobin: 12.1 g/dL (ref 12.0–15.0)
Immature Granulocytes: 1 %
Lymphocytes Relative: 18 %
Lymphs Abs: 2 K/uL (ref 0.7–4.0)
MCH: 25.7 pg — ABNORMAL LOW (ref 26.0–34.0)
MCHC: 29.9 g/dL — ABNORMAL LOW (ref 30.0–36.0)
MCV: 86.2 fL (ref 80.0–100.0)
Monocytes Absolute: 0.6 K/uL (ref 0.1–1.0)
Monocytes Relative: 6 %
Neutro Abs: 8 K/uL — ABNORMAL HIGH (ref 1.7–7.7)
Neutrophils Relative %: 73 %
Platelets: 325 K/uL (ref 150–400)
RBC: 4.7 MIL/uL (ref 3.87–5.11)
RDW: 15.9 % — ABNORMAL HIGH (ref 11.5–15.5)
WBC: 10.9 K/uL — ABNORMAL HIGH (ref 4.0–10.5)
nRBC: 0 % (ref 0.0–0.2)

## 2019-03-30 LAB — URINALYSIS, ROUTINE W REFLEX MICROSCOPIC
Bilirubin Urine: NEGATIVE
Glucose, UA: NEGATIVE mg/dL
Hgb urine dipstick: NEGATIVE
Ketones, ur: NEGATIVE mg/dL
Nitrite: NEGATIVE
Protein, ur: 100 mg/dL — AB
Specific Gravity, Urine: 1.021 (ref 1.005–1.030)
WBC, UA: >50 WBC/hpf — ABNORMAL HIGH (ref 0–5)
pH: 5 (ref 5.0–8.0)

## 2019-03-30 LAB — LACTIC ACID, PLASMA
Lactic Acid, Venous: 6.7 mmol/L (ref 0.5–1.9)
Lactic Acid, Venous: 5.7 mmol/L (ref 0.5–1.9)

## 2019-03-30 LAB — POC OCCULT BLOOD, ED: Fecal Occult Bld: POSITIVE — AB

## 2019-03-30 LAB — TROPONIN I (HIGH SENSITIVITY): Troponin I (High Sensitivity): 4 ng/L

## 2019-03-30 LAB — BRAIN NATRIURETIC PEPTIDE: B Natriuretic Peptide: 37.8 pg/mL (ref 0.0–100.0)

## 2019-03-30 MED ORDER — SODIUM CHLORIDE 0.9 % IV SOLN: 1.0000 g | INTRAVENOUS | Status: DC

## 2019-03-30 MED ORDER — ACETAMINOPHEN 325 MG PO TABS: 650.0000 mg | ORAL_TABLET | Freq: Four times a day (QID) | ORAL | Status: DC | PRN

## 2019-03-30 MED ORDER — LACTATED RINGERS IV BOLUS (SEPSIS)
1000.0000 mL | Freq: Once | INTRAVENOUS | Status: AC
  Administered 2019-03-30: 1000 mL via INTRAVENOUS

## 2019-03-30 MED ORDER — INSULIN ASPART 100 UNIT/ML ~~LOC~~ SOLN
0.0000 [IU] | Freq: Three times a day (TID) | SUBCUTANEOUS | Status: DC
  Administered 2019-03-31: 1 [IU] via SUBCUTANEOUS
  Administered 2019-03-31 – 2019-04-01 (×2): 2 [IU] via SUBCUTANEOUS
  Administered 2019-04-01: 1 [IU] via SUBCUTANEOUS
  Administered 2019-04-01: 2 [IU] via SUBCUTANEOUS
  Administered 2019-04-02: 1 [IU] via SUBCUTANEOUS
  Administered 2019-04-02: 2 [IU] via SUBCUTANEOUS
  Administered 2019-04-03 (×2): 1 [IU] via SUBCUTANEOUS
  Administered 2019-04-04: 5 [IU] via SUBCUTANEOUS
  Administered 2019-04-04: 3 [IU] via SUBCUTANEOUS
  Administered 2019-04-05: 2 [IU] via SUBCUTANEOUS
  Administered 2019-04-05: 1 [IU] via SUBCUTANEOUS

## 2019-03-30 MED ORDER — SODIUM CHLORIDE 0.9 % IV SOLN
1.0000 g | Freq: Once | INTRAVENOUS | Status: AC
  Administered 2019-03-30: 1 g via INTRAVENOUS
  Filled 2019-03-30: qty 10

## 2019-03-30 MED ORDER — INSULIN ASPART 100 UNIT/ML ~~LOC~~ SOLN: 0.0000 [IU] | Freq: Every day | SUBCUTANEOUS | Status: DC

## 2019-03-30 MED ORDER — LACTATED RINGERS IV BOLUS
1000.0000 mL | Freq: Once | INTRAVENOUS | Status: AC
  Administered 2019-03-30: 1000 mL via INTRAVENOUS

## 2019-03-30 MED ORDER — ENOXAPARIN SODIUM 40 MG/0.4ML ~~LOC~~ SOLN
40.0000 mg | Freq: Every day | SUBCUTANEOUS | Status: DC
  Administered 2019-03-31 – 2019-04-05 (×6): 40 mg via SUBCUTANEOUS
  Filled 2019-03-30 (×6): qty 0.4

## 2019-03-30 MED ORDER — ACETAMINOPHEN 650 MG RE SUPP: 650.0000 mg | Freq: Four times a day (QID) | RECTAL | Status: DC | PRN

## 2019-03-30 MED ORDER — SODIUM CHLORIDE 0.9 % IV SOLN: INTRAVENOUS | Status: AC

## 2019-03-30 NOTE — ED Notes (Signed)
Left voicemail with POA [name]) updating her on pt admission

## 2019-03-30 NOTE — H&P (Signed)
TRH H&P    Patient Demographics:    Lisa Villegas, is a 86 y.o. female  MRN: [id number]  DOB - [birth year]  Admit Date - 03/30/2019  Referring MD/NP/PA: Lovena Le Day  Outpatient Primary MD for the patient is [name], MD  Patient coming from:  ALF   Chief complaint-  Altered mental status, hypotension   HPI:    Lisa Villegas  is a 86 y.o. female,  w hx of autism, hypertension, hyperlipidemia, Dm2, CKD stage 3, Diabetic neuropathy, Chronic diastolic CHF, h/o CVA, Anemia, apparently presents with AMS and low bp  In ED,  T 96.5, P 74  R 18, Bp 135/54 pox 100% on RA Wt 72.6kg  CT Brain IMPRESSION: 1. No acute intracranial abnormality. 2. Unchanged atrophy and chronic small vessel ischemia.  CT abd/ pelvis IMPRESSION: 1. Circumferential wall thickening about the colon, most notable at the ascending and transverse colon, suspicious for acute colitis. 2. Large volume retained stool within the rectal vault, suggesting superimposed constipation. 3. Small layering bilateral pleural effusions with associated atelectatic changes.  4. 15 mm focus of irregular opacity within the right middle lobe, suspicious for a small focal infiltrate. Follow-up to resolution recommended. 5. Large hiatal hernia. 6.  Aortic Atherosclerosis (ICD10-I70.0).  CXR IMPRESSION: Hazy atelectasis at the lung bases.  Urinalysis wbc >50, rbc 0-5  Na 133 K 4.3, Bun 28, Creatinine 1.16 Ast 20, Alt 21, Alk phos 81, T. Bili 0.4 Hco3 17, AG 11 BNP 37.8 Trop 4  Lactic acid 6.4-> 5.7 Wbc 10.9, Hgb 12.1, Plt 325  Pt will be admitted for sepsis secondary to acute lower UTI, r/o pneumonia.      Review of systems:    In addition to the HPI above, pt is unable to provide due to AMS  No Fever-chills, No Headache, No changes with Vision or hearing, No problems swallowing food or Liquids, No Chest pain, Cough or  Shortness of Breath, No Abdominal pain, No Nausea or Vomiting, bowel movements are regular, No Blood in stool or Urine, No dysuria, No new skin rashes or bruises, No new joints pains-aches,  No new weakness, tingling, numbness in any extremity, No recent weight gain or loss, No polyuria, polydypsia or polyphagia, No significant Mental Stressors.  All other systems reviewed and are negative.    Past History of the following :    Past Medical History:  Diagnosis Date  . Anemia   . Anxiety and depression    per med rec  . 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 Autism   . Chronic CHF (congestive heart failure) (HCC)    per med rec  . Chronic kidney disease (CKD)    stage 3 per chart in 09/2015  . Diabetes mellitus without complication ([place])   . Diabetic neuropathy ([place])   . History of thyroid nodule    nontoxic goiter per med rec  . Hodgkin disease ([place])    in 25  . HTN (hypertension), benign   . Hyperlipidemia   . Incontinence   . Lack of sensation   .  Osteopenia    per med rec  . Poor balance   . Stroke ([place])   . Tubular adenoma of colon 01/2017  . Vitamin D deficiency       Past Surgical History:  Procedure Laterality Date  . ABDOMINAL HYSTERECTOMY    . BREAST LUMPECTOMY Left       Social History:      Social History   Tobacco Use  . Smoking status: Never Smoker  . Smokeless tobacco: Never Used  Substance Use Topics  . Alcohol use: No       Family History :     Family History  Problem Relation Age of Onset  . CAD Mother        died at 68 yo  . Atrial fibrillation Mother        and tachycardia  . CAD Father        died at 58 yo  . Atrial fibrillation Father        and tachycardia       Home Medications:   Prior to Admission medications   Medication Sig Start Date End Date Taking? Authorizing Provider  aspirin 81 MG tablet Take 1 tablet (81 mg total) by mouth daily. 07/26/17   Patrecia Pour, MD  atorvastatin (LIPITOR) 20 MG tablet Take 1 tablet  (20 mg total) by mouth every evening. 10/20/16   Henson, Vickie L, NP-C  calcium carbonate (TUMS EX) 750 MG chewable tablet Chew 1 tablet by mouth daily.    [provider]  Cholecalciferol (VITAMIN D3) 2000 UNITS TABS Take 1 tablet by mouth daily.     [provider]  FeFum-FePoly-FA-B Cmp-C-Biot (INTEGRA PLUS PO) Take 1 capsule by mouth daily.    [provider]  ferrous sulfate 325 (65 FE) MG tablet Take 325 mg by mouth daily.     [provider]  fesoterodine (TOVIAZ) 8 MG TB24 tablet TAKE 1 TAB BY MOUTH EVERY DAY.  DO NOT CRUSH OR CHEW . Patient taking differently: Take 8 mg by mouth daily.  10/25/16   Denita Lung, MD  hydrALAZINE (APRESOLINE) 10 MG tablet Take 1 tablet (10 mg total) by mouth 3 (three) times daily. 08/03/17   Rai, Vernelle Emerald, MD  hydrALAZINE (APRESOLINE) 25 MG tablet Take 25 mg by mouth 3 (three) times daily as needed (for SBP 150 or greater and DBP 85 or greter).    [provider]  Infant Care Products (BABY SHAMPOO) SHAM Place 1 application into both eyes See admin instructions. Use to cleanse eyes and eyelids at bedtime     [provider]  magnesium oxide (MAG-OX) 400 MG tablet Take 400 mg by mouth daily.    [provider]  metFORMIN (GLUCOPHAGE) 500 MG tablet Take 1 tablet (500 mg total) by mouth 2 (two) times daily with a meal. 12/17/18 03/17/19  Dahal, Marlowe Aschoff, MD  Multiple Vitamins-Minerals (CERTA-VITE PO) Take 1 tablet by mouth daily.     [provider]  Nutritional Supplements (GLUCERNA PO) Take 1 Can by mouth 2 (two) times daily.     [provider]  omeprazole (PRILOSEC) 20 MG capsule Take 20 mg by mouth daily.  05/01/18   [provider]  Sodium Fluoride (PREVIDENT 5000 BOOSTER PLUS DT) Place 1 application onto teeth every evening.     [provider]  verapamil (CALAN-SR) 120 MG CR tablet Take 120 mg by mouth at bedtime.    [provider]     Allergies:  Allergies  Allergen Reactions  . Penicillins Rash    On MAR: Has patient had a PCN reaction causing immediate rash, facial/tongue/throat swelling, SOB or lightheadedness with hypotension: Yes Has patient had a PCN reaction causing severe rash involving mucus membranes or skin necrosis: Unk Has patient had a PCN reaction that required hospitalization: Unk Has patient had a PCN reaction occurring within the last 10 years: Unk If all of the above answers are "NO", then may proceed with Cephalosporin use.      Physical Exam:   Vitals  Blood pressure (!) 132/46, pulse 74, temperature (!) 96.5 F (35.8 C), temperature source Oral, resp. rate 19, height _0  (1.575 m), weight 72.6 kg, SpO2 97 %.  1.  General: Axoxo1(person, place)  2. Psychiatric: euthymic  3. Neurologic: Nonfocal, cn2-12 intact, reflexes 2+ symmetric, diffuse with no clonus, motor 5/5 in all 4 ext  4. HEENMT:  Anicteric, pupils 1.64m symmetric, direct consensual, intact Neck: no jvd  5. Respiratory : Slight crackles, bilateral base, no wheezing  6. Cardiovascular : rrr s1, s2, no m/g/r  7. Gastrointestinal:  Abd: soft, nt, nd, +bs  8. Skin:  Ext: no c/c/e, no rash  9.Musculoskeletal:  Good ROM    Data Review:    CBC Recent Labs  Lab 03/30/19 2049  WBC 10.9*  HGB 12.1  HCT 40.5  PLT 325  MCV 86.2  MCH 25.7*  MCHC 29.9*  RDW 15.9*  LYMPHSABS 2.0  MONOABS 0.6  EOSABS 0.2  BASOSABS 0.1   ------------------------------------------------------------------------------------------------------------------  Results for orders placed or performed during the hospital encounter of 03/30/19 (from the past 48 hour(s))  Urinalysis, Routine w reflex microscopic     Status: Abnormal   Collection Time: 03/30/19  8:41 PM  Result Value Ref Range   Color, Urine YELLOW YELLOW   APPearance CLOUDY (A) CLEAR   Specific Gravity, Urine 1.021 1.005 - 1.030   pH 5.0 5.0 - 8.0   Glucose, UA NEGATIVE  NEGATIVE mg/dL   Hgb urine dipstick NEGATIVE NEGATIVE   Bilirubin Urine NEGATIVE NEGATIVE   Ketones, ur NEGATIVE NEGATIVE mg/dL   Protein, ur 100 (A) NEGATIVE mg/dL   Nitrite NEGATIVE NEGATIVE   Leukocytes,Ua LARGE (A) NEGATIVE   RBC / HPF 0-5 0 - 5 RBC/hpf   WBC, UA >50 (H) 0 - 5 WBC/hpf   Bacteria, UA RARE (A) NONE SEEN   WBC Clumps PRESENT    Mucus PRESENT     Comment: Performed at [facility] [address] [id number] Lactic acid, plasma     Status: Abnormal   Collection Time: 03/30/19  8:49 PM  Result Value Ref Range   Lactic Acid, Venous 6.7 (HH) 0.5 - 1.9 mmol/L    Comment: CRITICAL RESULT CALLED TO, READ BACK BY AND VERIFIED WITH: [facility]01/02/21 2140 WAYK Performed at [facility] [address], [place] [address]  Comprehensive metabolic panel     Status: Abnormal   Collection Time: 03/30/19  8:49 PM  Result Value Ref Range   Sodium 133 (L) 135 - 145 mmol/L   Potassium 4.3 3.5 - 5.1 mmol/L   Chloride 105 98 - 111 mmol/L   CO2 17 (L) 22 - 32 mmol/L   Glucose, Bld 152 (H) 70 - 99 mg/dL   BUN 28 (H) 8 - 23 mg/dL   Creatinine, Ser 1.16 (H) 0.44 - 1.00 mg/dL   Calcium 8.7 (L) 8.9 - 10.3 mg/dL  Total Protein 6.1 (L) 6.5 - 8.1 g/dL   Albumin 3.0 (L) 3.5 - 5.0 g/dL   AST 20 15 - 41 U/L   ALT 21 0 - 44 U/L   Alkaline Phosphatase 81 38 - 126 U/L   Total Bilirubin 0.4 0.3 - 1.2 mg/dL   GFR calc non Af Amer 43 (L) >60 mL/min   GFR calc Af Amer 49 (L) >60 mL/min   Anion gap 11 5 - 15    Comment: Performed at [facility] [address], [place], [place] [address]  CBC WITH DIFFERENTIAL     Status: Abnormal   Collection Time: 03/30/19  8:49 PM  Result Value Ref Range   WBC 10.9 (H) 4.0 - 10.5 K/uL   RBC 4.70 3.87 - 5.11 MIL/uL   Hemoglobin 12.1 12.0 - 15.0 g/dL   HCT 40.5 36.0 - 46.0 %   MCV 86.2 80.0 - 100.0 fL   MCH 25.7 (L) 26.0 - 34.0 pg   MCHC 29.9 (L) 30.0 - 36.0 g/dL   RDW 15.9 (H) 11.5 - 15.5 %    Platelets 325 150 - 400 K/uL   nRBC 0.0 0.0 - 0.2 %   Neutrophils Relative % 73 %   Neutro Abs 8.0 (H) 1.7 - 7.7 K/uL   Lymphocytes Relative 18 %   Lymphs Abs 2.0 0.7 - 4.0 K/uL   Monocytes Relative 6 %   Monocytes Absolute 0.6 0.1 - 1.0 K/uL   Eosinophils Relative 1 %   Eosinophils Absolute 0.2 0.0 - 0.5 K/uL   Basophils Relative 1 %   Basophils Absolute 0.1 0.0 - 0.1 K/uL   Immature Granulocytes 1 %   Abs Immature Granulocytes 0.10 (H) 0.00 - 0.07 K/uL    Comment: Performed at [facility], [address], [place], [place] [address]  Brain natriuretic peptide     Status: None   Collection Time: 03/30/19  8:49 PM  Result Value Ref Range   B Natriuretic Peptide 37.8 0.0 - 100.0 pg/mL    Comment: Performed at [facility] [address], [place], [place] [address]  Troponin I (High Sensitivity)     Status: None   Collection Time: 03/30/19  8:49 PM  Result Value Ref Range   Troponin I (High Sensitivity) 4 <18 ng/L    Comment: (NOTE) Elevated high sensitivity troponin I (hsTnI) values and significant  changes across serial measurements may suggest ACS but many other  chronic and acute conditions are known to elevate hsTnI results.  Refer to the "Links" section for chest pain algorithms and additional  guidance. Performed at [facility], [place] [address] [place], [place] [address]   POC occult blood, ED     Status: Abnormal   Collection Time: 03/30/19  9:02 PM  Result Value Ref Range   Fecal Occult Bld POSITIVE (A) NEGATIVE  Lactic acid, plasma     Status: Abnormal   Collection Time: 03/30/19 10:53 PM  Result Value Ref Range   Lactic Acid, Venous 5.7 (HH) 0.5 - 1.9 mmol/L    Comment: CRITICAL VALUE NOTED.  VALUE IS CONSISTENT WITH PREVIOUSLY REPORTED AND CALLED VALUE. Performed at [facility], [place] [address], [place], [place] [address]     Chemistries  Recent Labs  Lab 03/30/19 2049  NA 133*  K 4.3  CL 105  CO2 17*  GLUCOSE 152*  BUN 28*    CREATININE 1.16*  CALCIUM 8.7*  AST 20  ALT 21  ALKPHOS 81  BILITOT 0.4   ------------------------------------------------------------------------------------------------------------------  ------------------------------------------------------------------------------------------------------------------ GFR: Estimated Creatinine Clearance: 32.5 mL/min (A) (by C-G formula based on SCr of 1.16 mg/dL (H)). Liver Function Tests: Recent Labs  Lab 03/30/19 2049  AST 20  ALT 21  ALKPHOS 81  BILITOT 0.4  PROT 6.1*  ALBUMIN 3.0*   No results for input(s): LIPASE, AMYLASE in the last 168 hours. No results for input(s): AMMONIA in the last 168 hours. Coagulation Profile: No results for input(s): INR, PROTIME in the last 168 hours. Cardiac Enzymes: No results for input(s): CKTOTAL, CKMB, CKMBINDEX, TROPONINI in the last 168 hours. BNP (last 3 results) No results for input(s): PROBNP in the last 8760 hours. HbA1C: No results for input(s): HGBA1C in the last 72 hours. CBG: No results for input(s): GLUCAP in the last 168 hours. Lipid Profile: No results for input(s): CHOL, HDL, LDLCALC, TRIG, CHOLHDL, LDLDIRECT in the last 72 hours. Thyroid Function Tests: No results for input(s): TSH, T4TOTAL, FREET4, T3FREE, THYROIDAB in the last 72 hours. Anemia Panel: No results for input(s): VITAMINB12, FOLATE, FERRITIN, TIBC, IRON, RETICCTPCT in the last 72 hours.  --------------------------------------------------------------------------------------------------------------- Urine analysis:    Component Value Date/Time   COLORURINE YELLOW 03/30/2019 2041   APPEARANCEUR CLOUDY (A) 03/30/2019 2041   APPEARANCEUR Clear 10/11/2017 1310   LABSPEC 1.021 03/30/2019 2041   PHURINE 5.0 03/30/2019 2041   GLUCOSEU NEGATIVE 03/30/2019 2041   HGBUR NEGATIVE 03/30/2019 2041   BILIRUBINUR NEGATIVE 03/30/2019 2041   BILIRUBINUR Negative 10/11/2017 [place] 03/30/2019 2041   PROTEINUR  100 (A) 03/30/2019 2041   UROBILINOGEN negative 03/14/2016 1227   NITRITE NEGATIVE 03/30/2019 2041   LEUKOCYTESUR LARGE (A) 03/30/2019 2041      Imaging Results:    DG Chest Port 1 View  Result Date: 03/30/2019 CLINICAL DATA:  Hypotension EXAM: PORTABLE CHEST 1 VIEW COMPARISON:  12/16/2018 FINDINGS: Hazy atelectasis at the bases. No pleural effusion. Stable cardiomediastinal silhouette with aortic atherosclerosis. No pneumothorax. IMPRESSION: Hazy atelectasis at the lung bases. Electronically Signed   By: Donavan Foil M.D.   On: 03/30/2019 21:08      Assessment & Plan:    Active Problems:   Sepsis ([place])   Sepsis (hypothermia, hypotension, elevated lactic acid) Blood culture x2  Urine culture vanco iv , cefepime iv pharmacy to dose  Acute Lower UTI Urine culture , abx as above  ?Hcap (5m opacity in the right middle lobe) abx as above F/u til resolution recommended by radiology  AMS secondary to UTI STOP Toviaz for now  Hyponatremia Hydrate with ns iv Check cmp in am  Hypertension Cont Hydralazine  Cont Verapamil 1252mpo qhs  Hyperlipidemia Cont Lipitor 2078mo qhs  Dm2 Hold metformin due to lactic acidosis fsbs ac and qhs, ISS  H/o CVA Cont aspirin  Gerd Cont PPI  H/o Anemia Cont Ferrous sulfate  DVT Prophylaxis-   Lovenox - SCDs   AM Labs Ordered, also please review Full Orders  Family Communication: Admission, patients condition and plan of care including tests being ordered have been discussed with the patient who indicate understanding and agree with the plan and Code Status.  Code Status:  DNR per patient, attempted to contact Legal Guardian, no response.   Admission status: Inpatient: Based on patients clinical presentation and evaluation of above clinical data, I have made determination that patient meets Inpatient criteria at this time.   Pt will require iv abx for sepsis , and has high risk of clinical  deterioration, pt will require > 2  nites stay.   Time spent in minutes : 70 minutes  Jani Gravel M.D on 03/30/2019 at 11:57 PM

## 2019-03-30 NOTE — ED Triage Notes (Signed)
BIB EMS from [facility]. LS\N 1630. Per facility pt was found "altered" and hypotensive and had a L sided lean. Was given 517ml NS en route and symptoms resolved. Pt now normotensive.
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
## 2019-03-30 NOTE — ED Notes (Signed)
Spoke with [name])  Would like call with room assignment.

## 2019-03-30 NOTE — ED Notes (Signed)
Lisa Villegas) [phone] call with any questions about patients health as well as updates on the patient

## 2019-03-31 DIAGNOSIS — D649 Anemia, unspecified: Secondary | ICD-10-CM

## 2019-03-31 DIAGNOSIS — N390 Urinary tract infection, site not specified: Secondary | ICD-10-CM | POA: Diagnosis present

## 2019-03-31 LAB — CBC
HCT: 35.6 % — ABNORMAL LOW (ref 36.0–46.0)
Hemoglobin: 10.7 g/dL — ABNORMAL LOW (ref 12.0–15.0)
MCH: 25.4 pg — ABNORMAL LOW (ref 26.0–34.0)
MCHC: 30.1 g/dL (ref 30.0–36.0)
MCV: 84.6 fL (ref 80.0–100.0)
Platelets: 256 10*3/uL (ref 150–400)
RBC: 4.21 MIL/uL (ref 3.87–5.11)
RDW: 15.9 % — ABNORMAL HIGH (ref 11.5–15.5)
WBC: 9.1 10*3/uL (ref 4.0–10.5)
nRBC: 0 % (ref 0.0–0.2)

## 2019-03-31 LAB — COMPREHENSIVE METABOLIC PANEL
ALT: 16 U/L (ref 0–44)
AST: 17 U/L (ref 15–41)
Albumin: 2.7 g/dL — ABNORMAL LOW (ref 3.5–5.0)
Alkaline Phosphatase: 71 U/L (ref 38–126)
Anion gap: 10 (ref 5–15)
BUN: 21 mg/dL (ref 8–23)
CO2: 21 mmol/L — ABNORMAL LOW (ref 22–32)
Calcium: 8.7 mg/dL — ABNORMAL LOW (ref 8.9–10.3)
Chloride: 107 mmol/L (ref 98–111)
Creatinine, Ser: 0.86 mg/dL (ref 0.44–1.00)
GFR calc Af Amer: >60 mL/min (ref >60)
GFR calc non Af Amer: >60 mL/min (ref >60)
Glucose, Bld: 137 mg/dL — ABNORMAL HIGH (ref 70–99)
Potassium: 4.4 mmol/L (ref 3.5–5.1)
Sodium: 138 mmol/L (ref 135–145)
Total Bilirubin: 0.4 mg/dL (ref 0.3–1.2)
Total Protein: 5.4 g/dL — ABNORMAL LOW (ref 6.5–8.1)

## 2019-03-31 LAB — CBG MONITORING, ED
Glucose-Capillary: 163 mg/dL — ABNORMAL HIGH (ref 70–99)
Glucose-Capillary: 118 mg/dL — ABNORMAL HIGH (ref 70–99)
Glucose-Capillary: 197 mg/dL — ABNORMAL HIGH (ref 70–99)

## 2019-03-31 LAB — SARS CORONAVIRUS 2 (TAT 6-24 HRS): SARS Coronavirus 2: NEGATIVE

## 2019-03-31 LAB — TROPONIN I (HIGH SENSITIVITY): Troponin I (High Sensitivity): 6 ng/L (ref <18)

## 2019-03-31 LAB — GLUCOSE, CAPILLARY
Glucose-Capillary: 122 mg/dL — ABNORMAL HIGH (ref 70–99)
Glucose-Capillary: 190 mg/dL — ABNORMAL HIGH (ref 70–99)

## 2019-03-31 MED ORDER — VANCOMYCIN HCL 750 MG/150ML IV SOLN
750.0000 mg | INTRAVENOUS | Status: DC
  Administered 2019-03-31 – 2019-04-02 (×3): 750 mg via INTRAVENOUS
  Filled 2019-03-31 (×4): qty 150

## 2019-03-31 MED ORDER — VITAMIN D 25 MCG (1000 UNIT) PO TABS
2000.0000 [IU] | ORAL_TABLET | Freq: Every day | ORAL | Status: DC
  Administered 2019-04-01 – 2019-04-05 (×5): 2000 [IU] via ORAL
  Filled 2019-03-31 (×5): qty 2

## 2019-03-31 MED ORDER — ATORVASTATIN CALCIUM 10 MG PO TABS
20.0000 mg | ORAL_TABLET | Freq: Every evening | ORAL | Status: DC
  Administered 2019-03-31 – 2019-04-04 (×5): 20 mg via ORAL
  Filled 2019-03-31 (×5): qty 2

## 2019-03-31 MED ORDER — METRONIDAZOLE IN NACL 5-0.79 MG/ML-% IV SOLN
500.0000 mg | Freq: Three times a day (TID) | INTRAVENOUS | Status: DC
  Administered 2019-03-31 – 2019-04-05 (×15): 500 mg via INTRAVENOUS
  Filled 2019-03-31 (×15): qty 100

## 2019-03-31 MED ORDER — PANTOPRAZOLE SODIUM 40 MG PO TBEC
40.0000 mg | DELAYED_RELEASE_TABLET | Freq: Every day | ORAL | Status: DC
  Administered 2019-03-31 – 2019-04-05 (×6): 40 mg via ORAL
  Filled 2019-03-31 (×6): qty 1

## 2019-03-31 MED ORDER — SODIUM CHLORIDE 0.9 % IV SOLN
2.0000 g | Freq: Two times a day (BID) | INTRAVENOUS | Status: DC
  Administered 2019-03-31 – 2019-04-02 (×6): 2 g via INTRAVENOUS
  Filled 2019-03-31 (×7): qty 2

## 2019-03-31 MED ORDER — ASPIRIN EC 81 MG PO TBEC
81.0000 mg | DELAYED_RELEASE_TABLET | Freq: Every day | ORAL | Status: DC
  Administered 2019-03-31 – 2019-04-05 (×6): 81 mg via ORAL
  Filled 2019-03-31 (×6): qty 1

## 2019-03-31 MED ORDER — FE FUMARATE-B12-VIT C-FA-IFC PO CAPS
1.0000 | ORAL_CAPSULE | Freq: Every day | ORAL | Status: DC
  Administered 2019-03-31 – 2019-04-05 (×6): 1 via ORAL
  Filled 2019-03-31 (×6): qty 1

## 2019-03-31 MED ORDER — HYDRALAZINE HCL 20 MG/ML IJ SOLN
5.0000 mg | Freq: Four times a day (QID) | INTRAMUSCULAR | Status: DC | PRN
  Administered 2019-03-31 – 2019-04-01 (×2): 5 mg via INTRAVENOUS
  Filled 2019-03-31 (×3): qty 1

## 2019-03-31 MED ORDER — HYDRALAZINE HCL 10 MG PO TABS
10.0000 mg | ORAL_TABLET | Freq: Three times a day (TID) | ORAL | Status: DC
  Administered 2019-03-31 – 2019-04-05 (×17): 10 mg via ORAL
  Filled 2019-03-31 (×19): qty 1

## 2019-03-31 MED ORDER — VERAPAMIL HCL ER 120 MG PO TBCR
120.0000 mg | EXTENDED_RELEASE_TABLET | Freq: Every day | ORAL | Status: DC
  Administered 2019-03-31 – 2019-04-03 (×4): 120 mg via ORAL
  Filled 2019-03-31 (×6): qty 1

## 2019-03-31 NOTE — Progress Notes (Signed)
Pharmacy Antibiotic Note  Lisa Villegas is a 86 y.o. female admitted on 03/30/2019 with sepsis.  Pharmacy has been consulted for Vancomycin/Cefepime dosing. WBC 10.9. Scr 1.16. Lactic acid elevated. Temp low at 96.5.   Plan: Vancomycin 750 mg IV q24h >>Estimated AUC: 524 Cefepime 2g IV q12h Trend WBC, temp, renal function  F/U infectious work-up Drug levels as indicated   Height: 5\' 2"  (157.5 cm) Weight: 160 lb (72.6 kg) IBW/kg (Calculated) : 50.1  Temp (24hrs), Avg:96.5 F (35.8 C), Min:96.5 F (35.8 C), Max:96.5 F (35.8 C)  Recent Labs  Lab 03/30/19 2049 03/30/19 2253  WBC 10.9*  --   CREATININE 1.16*  --   LATICACIDVEN 6.7* 5.7*    Estimated Creatinine Clearance: 32.5 mL/min (A) (by C-G formula based on SCr of 1.16 mg/dL (H)).    Allergies  Allergen Reactions  . Penicillins Rash    On MAR: Has patient had a PCN reaction causing immediate rash, facial/tongue/throat swelling, SOB or lightheadedness with hypotension: Yes Has patient had a PCN reaction causing severe rash involving mucus membranes or skin necrosis: Unk Has patient had a PCN reaction that required hospitalization: Unk Has patient had a PCN reaction occurring within the last 10 years: Unk If all of the above answers are "NO", then may proceed with Cephalosporin use.     Narda Bonds, PharmD, BCPS Clinical Pharmacist Phone: [phone]

## 2019-03-31 NOTE — ED Notes (Signed)
Dr. Maudie Mercury paged for request for covid swab order for patient.

## 2019-03-31 NOTE — Progress Notes (Signed)
PROGRESS NOTE    Lisa Villegas  SSN-[id number] DOB: 08-Sep-1930 DOA: 03/30/2019 PCP: [name], MD   Brief Narrative:  Per admitting physician: Melody Cameron  is a 86 y.o. female,  w hx of autism, hypertension, hyperlipidemia, Dm2, CKD stage 3, Diabetic neuropathy, Chronic diastolic CHF, h/o CVA, Anemia, apparently presents with AMS and low bp  In the ER In ED,  T 96.5, P 74  R 18, Bp 135/54 pox 100% on RA Wt 72.6kg CT brain had no acute abnormalities CT of abdomen pelvis concerning for colitis of ascending and transverse colon Chest x-ray showing hazy atelectasis of lung bases   Assessment & Plan:   Principal Problem:   Sepsis ([place]) Active Problems:   Type II diabetes mellitus with renal manifestations ([place])   Hypertension   Hyperlipidemia   Anemia   GERD (gastroesophageal reflux disease)   Acute lower UTI   Sepsis/COLITIS (hypothermia, hypotension, elevated lactic acid) Blood culture x2  Urine culture C/w vanco iv , cefepime iv pharmacy to dose Added Flagyl  Acute Lower UTI F/up Urine culture ,  abx as above  ?Hcap (86mm opacity in the right middle lobe) abx as above F/u til resolution recommended by radiology   AMS secondary to UTI STOP Toviaz for now  Hyponatremia Resolved 138 Hydrated with ns iv Check cmp in am  Hypertension Cont Hydralazine  Cont Verapamil 120mg  po qhs  Hyperlipidemia Cont Lipitor 20mg  po qhs  Dm2 Hold metformin due to lactic acidosis fsbs ac and qhs, ISS  H/o CVA Cont aspirin  Gerd Cont PPI  H/o Anemia Cont Ferrous sulfate  DVT Prophylaxis-   Lovenox - SCDs    DVT prophylaxis: Lovenox SQ  Code Status: DNR    Code Status Orders  (From admission, onward)         Start     Ordered   03/30/19 2224  Do not attempt resuscitation (DNR)  Continuous    Question Answer Comment  In the event of cardiac or respiratory ARREST Do not call a "code blue"   In the event of cardiac or  respiratory ARREST Do not perform Intubation, CPR, defibrillation or ACLS   In the event of cardiac or respiratory ARREST Use medication by any route, position, wound care, and other measures to relive pain and suffering. May use oxygen, suction and manual treatment of airway obstruction as needed for comfort.      03/30/19 2226        Code Status History    Date Active Date Inactive Code Status Order ID Comments User Context   12/15/2018 0321 12/17/2018 1639 DNR [id number]  Rise Patience, MD ED   12/15/2018 0304 12/15/2018 0320 DNR [id number]  Rise Patience, MD ED   08/18/2017 1640 08/19/2017 2149 DNR [id number]  Rondel Jumbo, PA-C ED   08/18/2017 1307 08/18/2017 1640 DNR [id number]  Orlie Dakin, MD ED   07/31/2017 1359 08/03/2017 1953 DNR [id number]  Samella Parr, NP ED   07/25/2017 2352 07/27/2017 0252 DNR [id number]  Ivor Costa, MD ED   Advance Care Planning Activity     Family Communication: NONE TODAY  Disposition Plan:   Patient was admitted inpatient we will continue with IV antibiotics, patient not medically cleared stable for discharge Consults called: None Admission status: Inpatient   Consultants:   None  Procedures:  CT ABDOMEN PELVIS WO CONTRAST  Result Date: 03/31/2019 CLINICAL DATA:  Initial evaluation for acute abdominal pain with elevated lactate. EXAM: CT ABDOMEN AND  PELVIS WITHOUT CONTRAST TECHNIQUE: Multidetector CT imaging of the abdomen and pelvis was performed following the standard protocol without IV contrast. COMPARISON:  Prior CT from 03/30/2018. FINDINGS: Lower chest: Small layering bilateral pleural effusions with associated atelectatic changes. 15 mm focus of irregular opacity seen within the right middle lobe (series 5, image 11) suspicious for a small focal infiltrate. Small fat containing Bochdalek's type hernia noted at the right lung base. Extensive coronary artery calcifications noted about the visualized heart. Hepatobiliary: Liver demonstrates  a normal unenhanced appearance. Gallbladder contracted without acute abnormality. No biliary dilatation. Pancreas: She pancreas within normal limits. Spleen: Spleen within normal limits. Adrenals/Urinary Tract: Right adrenal gland within normal limits. 11 mm nodule present at the left adrenal gland, indeterminate, but stable from previous. Few scattered simple cyst noted within the kidneys bilaterally. No nephrolithiasis or hydronephrosis. No hydroureter. Partially distended bladder within normal limits. Stomach/Bowel: Large hiatal hernia noted. Stomach otherwise unremarkable. No evidence for bowel obstruction. Circumferential wall thickening seen about the colon, suspicious for acute colitis. Changes most pronounced at the ascending and transverse colon. Redundancy of the sigmoid colon noted. Large volume retained stool impacted within the rectal vault, suggesting constipation. No other acute inflammatory changes seen about the bowels. Vascular/Lymphatic: Moderate to advanced aorto bi-iliac atherosclerotic disease. No aneurysm. Few mildly prominent mesenteric lymph nodes with associated mesenteric edema noted, nonspecific, but could be reactive. No other pathologically enlarged lymph nodes seen within the abdomen and pelvis. Reproductive: Uterus is absent. Ovaries not definitely of identified. Other: No free air or fluid. Musculoskeletal: No acute osseous abnormality. No worrisome osseous lesions. Multilevel compression deformities noted within the visualized thoracolumbar spine, most pronounced at L3, stable from previous. IMPRESSION: 1. Circumferential wall thickening about the colon, most notable at the ascending and transverse colon, suspicious for acute colitis. 2. Large volume retained stool within the rectal vault, suggesting superimposed constipation. 3. Small layering bilateral pleural effusions with associated atelectatic changes. 4. 15 mm focus of irregular opacity within the right middle lobe,  suspicious for a small focal infiltrate. Follow-up to resolution recommended. 5. Large hiatal hernia. 6.  Aortic Atherosclerosis (ICD10-I70.0). Electronically Signed   By: Jeannine Boga M.D.   On: 03/31/2019 00:24   CT Head Wo Contrast  Result Date: 03/31/2019 CLINICAL DATA:  Altered mental status. Left-sided lean. EXAM: CT HEAD WITHOUT CONTRAST TECHNIQUE: Contiguous axial images were obtained from the base of the skull through the vertex without intravenous contrast. COMPARISON:  Head CT 12/14/2018 FINDINGS: Brain: No intracranial hemorrhage, mass effect, or midline shift. Generalized atrophy is unchanged. No hydrocephalus. The basilar cisterns are patent. Unchanged chronic small vessel ischemia. No evidence of territorial infarct or acute ischemia. No extra-axial or intracranial fluid collection. Vascular: No acute hyperdense vessel. Skull base atherosclerosis. Small focal calcifications in the basilar artery is unchanged. Skull: No fracture or focal lesion. Sinuses/Orbits: Chronic mucosal thickening of left side of sphenoid sinus. No acute findings. Prior bilateral cataract resection. Other: None. IMPRESSION: 1. No acute intracranial abnormality. 2. Unchanged atrophy and chronic small vessel ischemia. Electronically Signed   By: Keith Rake M.D.   On: 03/31/2019 00:07   DG Chest Port 1 View  Result Date: 03/30/2019 CLINICAL DATA:  Hypotension EXAM: PORTABLE CHEST 1 VIEW COMPARISON:  12/16/2018 FINDINGS: Hazy atelectasis at the bases. No pleural effusion. Stable cardiomediastinal silhouette with aortic atherosclerosis. No pneumothorax. IMPRESSION: Hazy atelectasis at the lung bases. Electronically Signed   By: Donavan Foil M.D.   On: 03/30/2019 21:08     Antimicrobials:  Cefepime and vancomycin Added Flagyl   Subjective: No acute events overnight still awaiting bed in the floor  Objective: Vitals:   03/31/19 1230 03/31/19 1300 03/31/19 1330 03/31/19 1500  BP: (!) 185/85 (!)  148/79 (!) 173/65 (!) 191/78  Pulse: 78 83 86   Resp: 18 20 (!) 23 19  Temp:      TempSrc:      SpO2: 96% 97% 96%   Weight:      Height:        Intake/Output Summary (Last 24 hours) at 03/31/2019 1527 Last data filed at 03/31/2019 0949 Gross per 24 hour  Intake 1460 ml  Output --  Net 1460 ml   Filed Weights   03/30/19 2036  Weight: 72.6 kg    Examination:  General exam: Appears calm and comfortable  Respiratory system: Clear to auscultation. Respiratory effort normal. Cardiovascular system: S1 & S2 heard, RRR. No JVD, murmurs, rubs, gallops or clicks. No pedal edema. Gastrointestinal system: Abdomen is nondistended, soft and nontender. No organomegaly or masses felt. Normal bowel sounds heard. Central nervous system: Alert oriented to person and place. No focal neurological deficits.  Nonfocal exam Extremities: Warm well perfused Skin: No rashes, lesions or ulcers      Data Reviewed: I have personally reviewed following labs and imaging studies  CBC: Recent Labs  Lab 03/30/19 2049 03/31/19 0424  WBC 10.9* 9.1  NEUTROABS 8.0*  --   HGB 12.1 10.7*  HCT 40.5 35.6*  MCV 86.2 84.6  PLT 325 [id number]   Basic Metabolic Panel: Recent Labs  Lab 03/30/19 2049 03/31/19 0424  NA 133* 138  K 4.3 4.4  CL 105 107  CO2 17* 21*  GLUCOSE 152* 137*  BUN 28* 21  CREATININE 1.16* 0.86  CALCIUM 8.7* 8.7*   GFR: Estimated Creatinine Clearance: 43.8 mL/min (by C-G formula based on SCr of 0.86 mg/dL). Liver Function Tests: Recent Labs  Lab 03/30/19 2049 03/31/19 0424  AST 20 17  ALT 21 16  ALKPHOS 81 71  BILITOT 0.4 0.4  PROT 6.1* 5.4*  ALBUMIN 3.0* 2.7*   No results for input(s): LIPASE, AMYLASE in the last 168 hours. No results for input(s): AMMONIA in the last 168 hours. Coagulation Profile: No results for input(s): INR, PROTIME in the last 168 hours. Cardiac Enzymes: No results for input(s): CKTOTAL, CKMB, CKMBINDEX, TROPONINI in the last 168 hours. BNP (last 3  results) No results for input(s): PROBNP in the last 8760 hours. HbA1C: No results for input(s): HGBA1C in the last 72 hours. CBG: Recent Labs  Lab 03/31/19 0255 03/31/19 0810 03/31/19 1232  GLUCAP 163* 118* 197*   Lipid Profile: No results for input(s): CHOL, HDL, LDLCALC, TRIG, CHOLHDL, LDLDIRECT in the last 72 hours. Thyroid Function Tests: No results for input(s): TSH, T4TOTAL, FREET4, T3FREE, THYROIDAB in the last 72 hours. Anemia Panel: No results for input(s): VITAMINB12, FOLATE, FERRITIN, TIBC, IRON, RETICCTPCT in the last 72 hours. Sepsis Labs: Recent Labs  Lab 03/30/19 2049 03/30/19 2253  LATICACIDVEN 6.7* 5.7*    Recent Results (from the past 240 hour(s))  SARS CORONAVIRUS 2 (TAT 6-24 HRS) Nasopharyngeal Nasopharyngeal Swab     Status: None   Collection Time: 03/31/19  4:59 AM   Specimen: Nasopharyngeal Swab  Result Value Ref Range Status   SARS Coronavirus 2 NEGATIVE NEGATIVE Final    Comment: (NOTE) SARS-CoV-2 target nucleic acids are NOT DETECTED. The SARS-CoV-2 RNA is generally detectable in upper and lower respiratory specimens during the acute phase of  infection. Negative results do not preclude SARS-CoV-2 infection, do not rule out co-infections with other pathogens, and should not be used as the sole basis for treatment or other patient management decisions. Negative results must be combined with clinical observations, patient history, and epidemiological information. The expected result is Negative. Fact Sheet for Patients: SugarRoll.be Fact Sheet for Healthcare Providers: https://www.woods-mathews.com/ This test is not yet approved or cleared by the [place] FDA and  has been authorized for detection and/or diagnosis of SARS-CoV-2 by FDA under an Emergency Use Authorization (EUA). This EUA will remain  in effect (meaning this test can be used) for the duration of the COVID-19 declaration under Section  56 4(b)(1) of the Act, 21 U.S.C. section 360bbb-3(b)(1), unless the authorization is terminated or revoked sooner. Performed at [facility], [place] [address], [place], [place] [address]          Radiology Studies: CT ABDOMEN PELVIS WO CONTRAST  Result Date: 03/31/2019 CLINICAL DATA:  Initial evaluation for acute abdominal pain with elevated lactate. EXAM: CT ABDOMEN AND PELVIS WITHOUT CONTRAST TECHNIQUE: Multidetector CT imaging of the abdomen and pelvis was performed following the standard protocol without IV contrast. COMPARISON:  Prior CT from 03/30/2018. FINDINGS: Lower chest: Small layering bilateral pleural effusions with associated atelectatic changes. 15 mm focus of irregular opacity seen within the right middle lobe (series 5, image 11) suspicious for a small focal infiltrate. Small fat containing Bochdalek's type hernia noted at the right lung base. Extensive coronary artery calcifications noted about the visualized heart. Hepatobiliary: Liver demonstrates a normal unenhanced appearance. Gallbladder contracted without acute abnormality. No biliary dilatation. Pancreas: She pancreas within normal limits. Spleen: Spleen within normal limits. Adrenals/Urinary Tract: Right adrenal gland within normal limits. 11 mm nodule present at the left adrenal gland, indeterminate, but stable from previous. Few scattered simple cyst noted within the kidneys bilaterally. No nephrolithiasis or hydronephrosis. No hydroureter. Partially distended bladder within normal limits. Stomach/Bowel: Large hiatal hernia noted. Stomach otherwise unremarkable. No evidence for bowel obstruction. Circumferential wall thickening seen about the colon, suspicious for acute colitis. Changes most pronounced at the ascending and transverse colon. Redundancy of the sigmoid colon noted. Large volume retained stool impacted within the rectal vault, suggesting constipation. No other acute inflammatory changes seen about the  bowels. Vascular/Lymphatic: Moderate to advanced aorto bi-iliac atherosclerotic disease. No aneurysm. Few mildly prominent mesenteric lymph nodes with associated mesenteric edema noted, nonspecific, but could be reactive. No other pathologically enlarged lymph nodes seen within the abdomen and pelvis. Reproductive: Uterus is absent. Ovaries not definitely of identified. Other: No free air or fluid. Musculoskeletal: No acute osseous abnormality. No worrisome osseous lesions. Multilevel compression deformities noted within the visualized thoracolumbar spine, most pronounced at L3, stable from previous. IMPRESSION: 1. Circumferential wall thickening about the colon, most notable at the ascending and transverse colon, suspicious for acute colitis. 2. Large volume retained stool within the rectal vault, suggesting superimposed constipation. 3. Small layering bilateral pleural effusions with associated atelectatic changes. 4. 15 mm focus of irregular opacity within the right middle lobe, suspicious for a small focal infiltrate. Follow-up to resolution recommended. 5. Large hiatal hernia. 6.  Aortic Atherosclerosis (ICD10-I70.0). Electronically Signed   By: Jeannine Boga M.D.   On: 03/31/2019 00:24   CT Head Wo Contrast  Result Date: 03/31/2019 CLINICAL DATA:  Altered mental status. Left-sided lean. EXAM: CT HEAD WITHOUT CONTRAST TECHNIQUE: Contiguous axial images were obtained from the base of the skull through the vertex without intravenous contrast. COMPARISON:  Head CT 12/14/2018 FINDINGS: Brain: No intracranial hemorrhage, mass effect, or midline shift. Generalized atrophy is unchanged. No hydrocephalus. The basilar cisterns are patent. Unchanged chronic small vessel ischemia. No evidence of territorial infarct or acute ischemia. No extra-axial or intracranial fluid collection. Vascular: No acute hyperdense vessel. Skull base atherosclerosis. Small focal calcifications in the basilar artery is unchanged.  Skull: No fracture or focal lesion. Sinuses/Orbits: Chronic mucosal thickening of left side of sphenoid sinus. No acute findings. Prior bilateral cataract resection. Other: None. IMPRESSION: 1. No acute intracranial abnormality. 2. Unchanged atrophy and chronic small vessel ischemia. Electronically Signed   By: Keith Rake M.D.   On: 03/31/2019 00:07   DG Chest Port 1 View  Result Date: 03/30/2019 CLINICAL DATA:  Hypotension EXAM: PORTABLE CHEST 1 VIEW COMPARISON:  12/16/2018 FINDINGS: Hazy atelectasis at the bases. No pleural effusion. Stable cardiomediastinal silhouette with aortic atherosclerosis. No pneumothorax. IMPRESSION: Hazy atelectasis at the lung bases. Electronically Signed   By: Donavan Foil M.D.   On: 03/30/2019 21:08        Scheduled Meds: . aspirin EC  81 mg Oral Daily  . atorvastatin  20 mg Oral QPM  . cholecalciferol  2,000 Units Oral Daily  . enoxaparin (LOVENOX) injection  40 mg Subcutaneous Daily  . ferrous [id number] C-folic acid  1 capsule Oral Daily  . hydrALAZINE  10 mg Oral TID  . insulin aspart  0-5 Units Subcutaneous QHS  . insulin aspart  0-9 Units Subcutaneous TID WC  . pantoprazole  40 mg Oral Daily  . verapamil  120 mg Oral QHS   Continuous Infusions: . ceFEPime (MAXIPIME) IV Stopped (03/31/19 0949)  . vancomycin Stopped (03/31/19 [id number])     LOS: 1 day    Time spent: [place], MD [facility]  If 7PM-7AM, please contact night-coverage  03/31/2019, 3:27 PM \

## 2019-03-31 NOTE — ED Notes (Signed)
Tele   Breakfast ordered  

## 2019-04-01 LAB — CBC
HCT: 38.1 % (ref 36.0–46.0)
Hemoglobin: 11.4 g/dL — ABNORMAL LOW (ref 12.0–15.0)
MCH: 25.4 pg — ABNORMAL LOW (ref 26.0–34.0)
MCHC: 29.9 g/dL — ABNORMAL LOW (ref 30.0–36.0)
MCV: 84.9 fL (ref 80.0–100.0)
Platelets: 237 10*3/uL (ref 150–400)
RBC: 4.49 MIL/uL (ref 3.87–5.11)
RDW: 16 % — ABNORMAL HIGH (ref 11.5–15.5)
WBC: 8.6 10*3/uL (ref 4.0–10.5)
nRBC: 0 % (ref 0.0–0.2)

## 2019-04-01 LAB — COMPREHENSIVE METABOLIC PANEL
ALT: 19 U/L (ref 0–44)
AST: 23 U/L (ref 15–41)
Albumin: 2.7 g/dL — ABNORMAL LOW (ref 3.5–5.0)
Alkaline Phosphatase: 82 U/L (ref 38–126)
Anion gap: 13 (ref 5–15)
BUN: 13 mg/dL (ref 8–23)
CO2: 16 mmol/L — ABNORMAL LOW (ref 22–32)
Calcium: 8.6 mg/dL — ABNORMAL LOW (ref 8.9–10.3)
Chloride: 106 mmol/L (ref 98–111)
Creatinine, Ser: 0.96 mg/dL (ref 0.44–1.00)
GFR calc Af Amer: >60 mL/min (ref >60)
GFR calc non Af Amer: 54 mL/min — ABNORMAL LOW (ref >60)
Glucose, Bld: 283 mg/dL — ABNORMAL HIGH (ref 70–99)
Potassium: 4.1 mmol/L (ref 3.5–5.1)
Sodium: 135 mmol/L (ref 135–145)
Total Bilirubin: 0.3 mg/dL (ref 0.3–1.2)
Total Protein: 5.7 g/dL — ABNORMAL LOW (ref 6.5–8.1)

## 2019-04-01 LAB — GLUCOSE, CAPILLARY
Glucose-Capillary: 144 mg/dL — ABNORMAL HIGH (ref 70–99)
Glucose-Capillary: 180 mg/dL — ABNORMAL HIGH (ref 70–99)
Glucose-Capillary: 158 mg/dL — ABNORMAL HIGH (ref 70–99)
Glucose-Capillary: 179 mg/dL — ABNORMAL HIGH (ref 70–99)

## 2019-04-01 LAB — URINE CULTURE: Culture: NO GROWTH

## 2019-04-01 MED ORDER — HYDRALAZINE HCL 25 MG PO TABS
25.0000 mg | ORAL_TABLET | Freq: Once | ORAL | Status: AC
  Administered 2019-04-01: 25 mg via ORAL
  Filled 2019-04-01: qty 1

## 2019-04-01 MED ORDER — LABETALOL HCL 5 MG/ML IV SOLN: 10.0000 mg | Freq: Four times a day (QID) | INTRAVENOUS | Status: DC | PRN

## 2019-04-01 MED ORDER — LORAZEPAM 2 MG/ML IJ SOLN
0.5000 mg | Freq: Once | INTRAMUSCULAR | Status: AC
  Administered 2019-04-01: 0.5 mg via INTRAVENOUS
  Filled 2019-04-01: qty 1

## 2019-04-01 NOTE — Progress Notes (Addendum)
PROGRESS NOTE    Lisa Villegas  SSN-[id number] DOB: 05/19/1930 DOA: 03/30/2019 PCP: [name], MD   Brief Narrative: As per HPI: 86 y.o.female,w hx of autism, hypertension, hyperlipidemia, Dm2, CKD stage 3, Diabetic neuropathy, Chronic diastolic CHF, h/o CVA, Anemia, apparently presents with AMS and low bp  In the ER- vitals stable with T 96.5, P 74 R 18, Bp 135/54 pox 100% on RA Wt 72.6kg. imaging CT brain had no acute abnormalities.CT of abdomen pelvis concerning for colitis of ascending and transverse colon.Chest x-ray showing hazy atelectasis of lung bases Patient was a started on Vanco cefepime and was admitted.  Subjective:  Overnight no fever, saturating well on room air blood pressure Q000111Q to [id number] systolic. On Full liquid diet-tolerating  alert,awake, oriented to place, current president, unable to tell me current date Labs improved- wbc nl, bicarb at 16, creat 0.9  Assessment & Plan:   Sepsis due to colitis presenting with hypothermia hypotension and lactic acidosis: Chest x-ray with hazy atelectasis of lung bases. CT showed colitis of ascending and transverse colon.  Patient is hemodynamically stable, afebrile, no hypothermia.  Culture data shows no growth and urine cultures.  No blood culture drawn in ED. COVID-19 negative.  Continue vancomycin cefepime and Flagyl.  Check procalcitonin level and de-escalate antibiotics.  Tolerating full liquid diet and advance as tolerated  Acute lower UTI: Urine culture no growth.  Antibiotics as above.  ? HCAP/15 mm opacity in the right middle lobe.  Clinically improving current antibiotics as above.  Follow-up imaging I n4-6 wks  Acute metabolic encephalopathy: Presented with confusion likely in the setting of UTI/sepsis.  Patient is hard of hearing but alert awake oriented conversant and able to interact.  CT brain on admission negative. She is nonfocal on exam.  Cont supportive care.  Type II diabetes mellitus with  renal manifestations: Blood sugar borderline controlled continue sliding scale insulin.  Metformin stopped due to lactic acidosis.  Hypertension: Blood pressure borderline controlled.  During her hydralazine, verapamil. History of stroke continue aspirin, statin. Hyperlipidemia: Continue her Lipitor.  Anemia likely from chronic disease.  Hemoglobin is stable.  Continue iron supplementation.  GERD: Continue PPI  Hyponatremia: Resolved. Metabolic acidosis with bicarbonate 16.  Monitor.  Lab in a.m.  Body mass index is 28.99 kg/m.    DVT prophylaxis:lovenox Code Status:DNR  Family Communication: plan of care discussed with patient at bedside.  Patient and nursing staff. Disposition Plan: Remains inpatient pending clinical improvement in sepsis.  Pending PT OT evaluation.   Consultants: none Procedures:none Microbiology:  Antimicrobials: Anti-infectives (From admission, onward)   Start     Dose/Rate Route Frequency Ordered Stop   03/31/19 2200  cefTRIAXone (ROCEPHIN) 1 g in sodium chloride 0.9 % 100 mL IVPB  Status:  Discontinued     1 g 200 mL/hr over 30 Minutes Intravenous Every 24 hours 03/30/19 2228 03/31/19 0112   03/31/19 1600  metroNIDAZOLE (FLAGYL) IVPB 500 mg     500 mg 100 mL/hr over 60 Minutes Intravenous Every 8 hours 03/31/19 1533     03/31/19 0200  ceFEPIme (MAXIPIME) 2 g in sodium chloride 0.9 % 100 mL IVPB     2 g 200 mL/hr over 30 Minutes Intravenous Every 12 hours 03/31/19 0143     03/31/19 0145  vancomycin (VANCOREADY) IVPB 750 mg/150 mL     750 mg 150 mL/hr over 60 Minutes Intravenous Every 24 hours 03/31/19 0143     03/30/19 2200  cefTRIAXone (ROCEPHIN) 1 g in sodium  chloride 0.9 % 100 mL IVPB     1 g 200 mL/hr over 30 Minutes Intravenous  Once 03/30/19 2155 03/30/19 2300       Objective: Vitals:   03/31/19 1547 03/31/19 2149 03/31/19 2243 04/01/19 0355  BP: (!) 156/86 (!) 151/65 (!) 150/76   Pulse: 61 81 79   Resp: 18 13 16    Temp: 98.7 F  (37.1 C) 98.3 F (36.8 C) 98.4 F (36.9 C)   TempSrc: Oral Oral Oral   SpO2: 94% 100% 98%   Weight:    71.9 kg  Height:        Intake/Output Summary (Last 24 hours) at 04/01/2019 0746 Last data filed at 04/01/2019 0255 Gross per 24 hour  Intake 1130 ml  Output 1250 ml  Net -120 ml   Filed Weights   03/30/19 2036 04/01/19 0355  Weight: 72.6 kg 71.9 kg   Weight change: -0.676 kg  Body mass index is 28.99 kg/m.  Intake/Output from previous day: 01/03 0701 - 01/04 0700 In: 1130 [P.O.:480; IV Piggyback:650] Out: 1250 [Urine:1250] Intake/Output this shift: No intake/output data recorded.  Examination:  General exam: AA oriented to place, people, on RA, NAD, Weak appearing. HEENT:Oral mucosa moist, Ear/Nose WNL grossly, dentition normal. Respiratory system: Diminished at the base,no wheezing or crackles,no use of accessory muscle Cardiovascular system: S1 & S2 +, No JVD,. Gastrointestinal system: Abdomen soft, NT,ND, BS+ Nervous System:Alert, awake, moving extremities and grossly nonfocal- moving all extremities well. Extremities: No edema, distal peripheral pulses palpable.  Skin: No rashes,no icterus. MSK: Normal muscle bulk,tone, power  Medications:  Scheduled Meds: . aspirin EC  81 mg Oral Daily  . atorvastatin  20 mg Oral QPM  . cholecalciferol  2,000 Units Oral Daily  . enoxaparin (LOVENOX) injection  40 mg Subcutaneous Daily  . ferrous Q000111Q C-folic acid  1 capsule Oral Daily  . hydrALAZINE  10 mg Oral TID  . insulin aspart  0-5 Units Subcutaneous QHS  . insulin aspart  0-9 Units Subcutaneous TID WC  . pantoprazole  40 mg Oral Daily  . verapamil  120 mg Oral QHS   Continuous Infusions: . ceFEPime (MAXIPIME) IV 2 g (03/31/19 2202)  . metronidazole 500 mg (04/01/19 0133)  . vancomycin 750 mg (04/01/19 0255)    Data Reviewed: I have personally reviewed following labs and imaging studies  CBC: Recent Labs  Lab 03/30/19 2049 03/31/19 0424    WBC 10.9* 9.1  NEUTROABS 8.0*  --   HGB 12.1 10.7*  HCT 40.5 35.6*  MCV 86.2 84.6  PLT 325 [id number]   Basic Metabolic Panel: Recent Labs  Lab 03/30/19 2049 03/31/19 0424  NA 133* 138  K 4.3 4.4  CL 105 107  CO2 17* 21*  GLUCOSE 152* 137*  BUN 28* 21  CREATININE 1.16* 0.86  CALCIUM 8.7* 8.7*   GFR: Estimated Creatinine Clearance: 43.6 mL/min (by C-G formula based on SCr of 0.86 mg/dL). Liver Function Tests: Recent Labs  Lab 03/30/19 2049 03/31/19 0424  AST 20 17  ALT 21 16  ALKPHOS 81 71  BILITOT 0.4 0.4  PROT 6.1* 5.4*  ALBUMIN 3.0* 2.7*   No results for input(s): LIPASE, AMYLASE in the last 168 hours. No results for input(s): AMMONIA in the last 168 hours. Coagulation Profile: No results for input(s): INR, PROTIME in the last 168 hours. Cardiac Enzymes: No results for input(s): CKTOTAL, CKMB, CKMBINDEX, TROPONINI in the last 168 hours. BNP (last 3 results) No results for input(s): PROBNP in the  last 8760 hours. HbA1C: No results for input(s): HGBA1C in the last 72 hours. CBG: Recent Labs  Lab 03/31/19 0255 03/31/19 0810 03/31/19 1232 03/31/19 1614 03/31/19 2127  GLUCAP 163* 118* 197* 122* 190*   Lipid Profile: No results for input(s): CHOL, HDL, LDLCALC, TRIG, CHOLHDL, LDLDIRECT in the last 72 hours. Thyroid Function Tests: No results for input(s): TSH, T4TOTAL, FREET4, T3FREE, THYROIDAB in the last 72 hours. Anemia Panel: No results for input(s): VITAMINB12, FOLATE, FERRITIN, TIBC, IRON, RETICCTPCT in the last 72 hours. Sepsis Labs: Recent Labs  Lab 03/30/19 2049 03/30/19 2253  LATICACIDVEN 6.7* 5.7*    Recent Results (from the past 240 hour(s))  SARS CORONAVIRUS 2 (TAT 6-24 HRS) Nasopharyngeal Nasopharyngeal Swab     Status: None   Collection Time: 03/31/19  4:59 AM   Specimen: Nasopharyngeal Swab  Result Value Ref Range Status   SARS Coronavirus 2 NEGATIVE NEGATIVE Final    Comment: (NOTE) SARS-CoV-2 target nucleic acids are NOT  DETECTED. The SARS-CoV-2 RNA is generally detectable in upper and lower respiratory specimens during the acute phase of infection. Negative results do not preclude SARS-CoV-2 infection, do not rule out co-infections with other pathogens, and should not be used as the sole basis for treatment or other patient management decisions. Negative results must be combined with clinical observations, patient history, and epidemiological information. The expected result is Negative. Fact Sheet for Patients: SugarRoll.be Fact Sheet for Healthcare Providers: https://www.woods-mathews.com/ This test is not yet approved or cleared by the [place] FDA and  has been authorized for detection and/or diagnosis of SARS-CoV-2 by FDA under an Emergency Use Authorization (EUA). This EUA will remain  in effect (meaning this test can be used) for the duration of the COVID-19 declaration under Section 56 4(b)(1) of the Act, 21 U.S.C. section 360bbb-3(b)(1), unless the authorization is terminated or revoked sooner. Performed at [facility], [place] [address] [place], [place] [address]       Radiology Studies: CT ABDOMEN PELVIS WO CONTRAST  Result Date: 03/31/2019 CLINICAL DATA:  Initial evaluation for acute abdominal pain with elevated lactate. EXAM: CT ABDOMEN AND PELVIS WITHOUT CONTRAST TECHNIQUE: Multidetector CT imaging of the abdomen and pelvis was performed following the standard protocol without IV contrast. COMPARISON:  Prior CT from 03/30/2018. FINDINGS: Lower chest: Small layering bilateral pleural effusions with associated atelectatic changes. 15 mm focus of irregular opacity seen within the right middle lobe (series 5, image 11) suspicious for a small focal infiltrate. Small fat containing Bochdalek's type hernia noted at the right lung base. Extensive coronary artery calcifications noted about the visualized heart. Hepatobiliary: Liver demonstrates a  normal unenhanced appearance. Gallbladder contracted without acute abnormality. No biliary dilatation. Pancreas: She pancreas within normal limits. Spleen: Spleen within normal limits. Adrenals/Urinary Tract: Right adrenal gland within normal limits. 11 mm nodule present at the left adrenal gland, indeterminate, but stable from previous. Few scattered simple cyst noted within the kidneys bilaterally. No nephrolithiasis or hydronephrosis. No hydroureter. Partially distended bladder within normal limits. Stomach/Bowel: Large hiatal hernia noted. Stomach otherwise unremarkable. No evidence for bowel obstruction. Circumferential wall thickening seen about the colon, suspicious for acute colitis. Changes most pronounced at the ascending and transverse colon. Redundancy of the sigmoid colon noted. Large volume retained stool impacted within the rectal vault, suggesting constipation. No other acute inflammatory changes seen about the bowels. Vascular/Lymphatic: Moderate to advanced aorto bi-iliac atherosclerotic disease. No aneurysm. Few mildly prominent mesenteric lymph nodes with associated mesenteric edema noted, nonspecific, but could be  reactive. No other pathologically enlarged lymph nodes seen within the abdomen and pelvis. Reproductive: Uterus is absent. Ovaries not definitely of identified. Other: No free air or fluid. Musculoskeletal: No acute osseous abnormality. No worrisome osseous lesions. Multilevel compression deformities noted within the visualized thoracolumbar spine, most pronounced at L3, stable from previous. IMPRESSION: 1. Circumferential wall thickening about the colon, most notable at the ascending and transverse colon, suspicious for acute colitis. 2. Large volume retained stool within the rectal vault, suggesting superimposed constipation. 3. Small layering bilateral pleural effusions with associated atelectatic changes. 4. 15 mm focus of irregular opacity within the right middle lobe, suspicious  for a small focal infiltrate. Follow-up to resolution recommended. 5. Large hiatal hernia. 6.  Aortic Atherosclerosis (ICD10-I70.0). Electronically Signed   By: Jeannine Boga M.D.   On: 03/31/2019 00:24   CT Head Wo Contrast  Result Date: 03/31/2019 CLINICAL DATA:  Altered mental status. Left-sided lean. EXAM: CT HEAD WITHOUT CONTRAST TECHNIQUE: Contiguous axial images were obtained from the base of the skull through the vertex without intravenous contrast. COMPARISON:  Head CT 12/14/2018 FINDINGS: Brain: No intracranial hemorrhage, mass effect, or midline shift. Generalized atrophy is unchanged. No hydrocephalus. The basilar cisterns are patent. Unchanged chronic small vessel ischemia. No evidence of territorial infarct or acute ischemia. No extra-axial or intracranial fluid collection. Vascular: No acute hyperdense vessel. Skull base atherosclerosis. Small focal calcifications in the basilar artery is unchanged. Skull: No fracture or focal lesion. Sinuses/Orbits: Chronic mucosal thickening of left side of sphenoid sinus. No acute findings. Prior bilateral cataract resection. Other: None. IMPRESSION: 1. No acute intracranial abnormality. 2. Unchanged atrophy and chronic small vessel ischemia. Electronically Signed   By: Keith Rake M.D.   On: 03/31/2019 00:07   DG Chest Port 1 View  Result Date: 03/30/2019 CLINICAL DATA:  Hypotension EXAM: PORTABLE CHEST 1 VIEW COMPARISON:  12/16/2018 FINDINGS: Hazy atelectasis at the bases. No pleural effusion. Stable cardiomediastinal silhouette with aortic atherosclerosis. No pneumothorax. IMPRESSION: Hazy atelectasis at the lung bases. Electronically Signed   By: Donavan Foil M.D.   On: 03/30/2019 21:08      LOS: 2 days   Time spent: More than 50% of that time was spent in counseling and/or coordination of care.  Antonieta Pert, MD [facility]  04/01/2019, 7:46 AM

## 2019-04-02 LAB — CBC
HCT: 36 % (ref 36.0–46.0)
Hemoglobin: 11.3 g/dL — ABNORMAL LOW (ref 12.0–15.0)
MCH: 25.7 pg — ABNORMAL LOW (ref 26.0–34.0)
MCHC: 31.4 g/dL (ref 30.0–36.0)
MCV: 81.8 fL (ref 80.0–100.0)
Platelets: 283 10*3/uL (ref 150–400)
RBC: 4.4 MIL/uL (ref 3.87–5.11)
RDW: 16 % — ABNORMAL HIGH (ref 11.5–15.5)
WBC: 8.4 10*3/uL (ref 4.0–10.5)
nRBC: 0 % (ref 0.0–0.2)

## 2019-04-02 LAB — GLUCOSE, CAPILLARY
Glucose-Capillary: 170 mg/dL — ABNORMAL HIGH (ref 70–99)
Glucose-Capillary: 137 mg/dL — ABNORMAL HIGH (ref 70–99)
Glucose-Capillary: 117 mg/dL — ABNORMAL HIGH (ref 70–99)
Glucose-Capillary: 114 mg/dL — ABNORMAL HIGH (ref 70–99)

## 2019-04-02 LAB — COMPREHENSIVE METABOLIC PANEL
ALT: 28 U/L (ref 0–44)
AST: 43 U/L — ABNORMAL HIGH (ref 15–41)
Albumin: 2.8 g/dL — ABNORMAL LOW (ref 3.5–5.0)
Alkaline Phosphatase: 81 U/L (ref 38–126)
Anion gap: 11 (ref 5–15)
BUN: 12 mg/dL (ref 8–23)
CO2: 20 mmol/L — ABNORMAL LOW (ref 22–32)
Calcium: 8.8 mg/dL — ABNORMAL LOW (ref 8.9–10.3)
Chloride: 103 mmol/L (ref 98–111)
Creatinine, Ser: 0.78 mg/dL (ref 0.44–1.00)
GFR calc Af Amer: >60 mL/min (ref >60)
GFR calc non Af Amer: >60 mL/min (ref >60)
Glucose, Bld: 162 mg/dL — ABNORMAL HIGH (ref 70–99)
Potassium: 4.4 mmol/L (ref 3.5–5.1)
Sodium: 134 mmol/L — ABNORMAL LOW (ref 135–145)
Total Bilirubin: 0.9 mg/dL (ref 0.3–1.2)
Total Protein: 5.9 g/dL — ABNORMAL LOW (ref 6.5–8.1)

## 2019-04-02 LAB — PROCALCITONIN: Procalcitonin: 0.16 ng/mL

## 2019-04-02 MED ORDER — HALOPERIDOL LACTATE 5 MG/ML IJ SOLN
1.0000 mg | Freq: Once | INTRAMUSCULAR | Status: AC
  Administered 2019-04-02: 1 mg via INTRAMUSCULAR
  Filled 2019-04-02: qty 1

## 2019-04-02 MED ORDER — SODIUM CHLORIDE 0.9 % IV SOLN
2.0000 g | INTRAVENOUS | Status: DC
  Administered 2019-04-02 – 2019-04-05 (×4): 2 g via INTRAVENOUS
  Filled 2019-04-02: qty 2
  Filled 2019-04-02 (×2): qty 20
  Filled 2019-04-02: qty 2
  Filled 2019-04-02 (×3): qty 20

## 2019-04-02 NOTE — Care Management Important Message (Signed)
Important Message  Patient Details  Name: Lisa Villegas MRN: [id number] Date of Birth: [birth year]   Medicare Important Message Given:  Yes     Orbie Pyo 04/02/2019, 3:13 PM

## 2019-04-02 NOTE — Evaluation (Signed)
Physical Therapy Evaluation Patient Details Name: Kalei Hawksley MRN: [id number] DOB: Dec 09, 1930 Today's Date: 04/02/2019   History of Present Illness  Ayzha Prusha  is a 86 y.o. female,  w hx of autism, hypertension, hyperlipidemia, Dm2, CKD stage 3, Diabetic neuropathy, Chronic diastolic CHF, h/o CVA, Anemia, apparently presents with AMS and low bp. Pt found to have colitis we associated sepsis.    Clinical Impression  Pt very confused and restless trying to get out of bed. Pt poor historian per RN pt from ALF and dependent on walker and leads a very sedentary life however does have a PT that follows her at ALF. Pt only able to verbalize name and birthdate. Pt currently requiring maxA for transfers and is at a very high falls risk. Pt unable to sequence steps to complete stand pvt to chair requiring maxA to advance L LE. At this time recommend SNF upon d/c to progress indep with mobility and achieve safe functional level to return to ALF.    Follow Up Recommendations SNF;Supervision/Assistance - 24 hour(unless ALF can provide 24/7 assist)    Equipment Recommendations  None recommended by PT    Recommendations for Other Services       Precautions / Restrictions Precautions Precautions: Fall Precaution Comments: confusion, bringing legs over bed rail Restrictions Weight Bearing Restrictions: No      Mobility  Bed Mobility Overal bed mobility: Needs Assistance Bed Mobility: Rolling;Supine to Sit;Sit to Supine Rolling: Supervision(pt moving on own in her bed)   Supine to sit: Max assist Sit to supine: Min assist(impulsively returned to bed)   General bed mobility comments: pt attempted to climb out of bed, however not responsive to commands for supine to sit, pt impulsively returning self to lying down  Transfers Overall transfer level: Needs assistance Equipment used: Rolling walker (2 wheeled);2 person hand held assist Transfers: Sit to/from [employer] Sit to Stand: Max assist Stand pivot transfers: Max assist;+2 physical assistance       General transfer comment: pt maxA to stand up to walker however unable to advance LEs to step towards chair despite max verbal and tactile cues, pt impulsively sat down and tried to lay back down. RN came into room and assisted PT with std pvt transfer to chair with 2 person assist with gait belt. Pt required maxA to advance L LE to step towards chair  Ambulation/Gait             General Gait Details: limted to std pvt to chair  Stairs            Wheelchair Mobility    Modified Rankin (Stroke Patients Only)       Balance Overall balance assessment: Needs assistance Sitting-balance support: Feet supported;Bilateral upper extremity supported Sitting balance-Leahy Scale: Poor Sitting balance - Comments: dependent on physical assist Postural control: Posterior lean Standing balance support: Bilateral upper extremity supported Standing balance-Leahy Scale: Zero Standing balance comment: dependent on physical assist                             Pertinent Vitals/Pain Pain Assessment: Faces Faces Pain Scale: No hurt    Home Living Family/patient expects to be discharged to:: Assisted living               Home Equipment: Walker - 2 wheels Additional Comments: pt poor historian/not answering questions with exception of name and birthdate. Per RN pt from ALF and has a  PT that follows her there. RN reports that patient lives sedentary life and when she does get up is heavily depencent on RW    Prior Function Level of Independence: Needs assistance   Gait / Transfers Assistance Needed: per RN pt dependent on walker, needs max encouragement to particiapte in ambulation  ADL's / Homemaking Assistance Needed: pt requires assist for all ADLS        Hand Dominance   Dominant Hand: Right    Extremity/Trunk Assessment   Upper Extremity Assessment Upper  Extremity Assessment: Generalized weakness    Lower Extremity Assessment Lower Extremity Assessment: Generalized weakness    Cervical / Trunk Assessment Cervical / Trunk Assessment: Kyphotic  Communication   Communication: Expressive difficulties(pt not engaging in conversation this date)  Cognition Arousal/Alertness: Awake/alert Behavior During Therapy: Restless;Impulsive Overall Cognitive Status: Impaired/Different from baseline Area of Impairment: Orientation;Attention;Following commands;Safety/judgement;Problem solving                 Orientation Level: Disoriented to;Place;Time;Situation(could only state name and birthdate) Current Attention Level: Focused   Following Commands: Follows one step commands inconsistently;Follows one step commands with increased time Safety/Judgement: Decreased awareness of safety(pt trying to get out of bed/climb over top of bedrail)   Problem Solving: Slow processing;Difficulty sequencing;Requires verbal cues;Requires tactile cues General Comments: pt not engaging in conversastion, very restless in the bed trying to climb over bedrail upon PT arrival. Pt re-oriented several times however there was no carry over, pt unable to state where she lives or how she gets around      General Comments General comments (skin integrity, edema, etc.): VSS, mild edema in feet    Exercises     Assessment/Plan    PT Assessment Patient needs continued PT services  PT Problem List Decreased strength;Decreased activity tolerance;Decreased balance;Decreased mobility;Decreased coordination;Decreased cognition;Decreased knowledge of use of DME;Decreased safety awareness       PT Treatment Interventions DME instruction;Gait training;Stair training;Functional mobility training;Therapeutic activities;Therapeutic exercise;Balance training;Cognitive remediation    PT Goals (Current goals can be found in the Care Plan section)  Acute Rehab PT Goals PT Goal  Formulation: Patient unable to participate in goal setting Time For Goal Achievement: 04/16/19 Potential to Achieve Goals: Fair    Frequency Min 3X/week   Barriers to discharge Decreased caregiver support unsure level of assist that can be provided by ALF    Co-evaluation               AM-PAC PT "6 Clicks" Mobility  Outcome Measure Help needed turning from your back to your side while in a flat bed without using bedrails?: A Little Help needed moving from lying on your back to sitting on the side of a flat bed without using bedrails?: A Lot Help needed moving to and from a bed to a chair (including a wheelchair)?: A Lot Help needed standing up from a chair using your arms (e.g., wheelchair or bedside chair)?: A Lot Help needed to walk in hospital room?: Total Help needed climbing 3-5 steps with a railing? : Total 6 Click Score: 11    End of Session Equipment Utilized During Treatment: Gait belt Activity Tolerance: Patient tolerated treatment well Patient left: in chair;with call bell/phone within reach;with chair alarm set Nurse Communication: Mobility status;Need for lift equipment(use stedy to get pt back) PT Visit Diagnosis: Unsteadiness on feet (R26.81);Repeated falls (R29.6);Muscle weakness (generalized) (M62.81);Difficulty in walking, not elsewhere classified (R26.2)    Time: [id number] PT Time Calculation (min) (ACUTE ONLY): 24 min   Charges:  PT Evaluation $PT Eval Moderate Complexity: 1 Mod PT Treatments $Therapeutic Activity: 8-22 mins        Kittie Plater, PT, DPT Acute Rehabilitation Services Pager #: [phone] Office #: [phone]   Berline Lopes 04/02/2019, 8:33 AM
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
## 2019-04-02 NOTE — Progress Notes (Signed)
PROGRESS NOTE    Lisa Villegas  SSN-[id number] DOB: 1930-10-21 DOA: 03/30/2019 PCP: [name], MD   Brief Narrative: As per HPI: 86 y.o.female,w hx of autism, hypertension, hyperlipidemia, Dm2, CKD stage 3, Diabetic neuropathy, Chronic diastolic CHF, h/o CVA, Anemia, apparently presents with AMS and low bp  In the ER- vitals stable with T 96.5, P 74 R 18, Bp 135/54 pox 100% on RA Wt 72.6kg. imaging CT brain had no acute abnormalities.CT of abdomen pelvis concerning for colitis of ascending and transverse colon.Chest x-ray showing hazy atelectasis of lung bases Patient was a started on Vanco cefepime and was admitted.  Subjective:  Overnight patient was somewhat confused, needed to have tele 1:1. He woke up on calling alert awake, abel to tell me her name and her location denies any complaint. Legal guardian does not want patient return to [facility]   Tolerating soft diet.  Assessment & Plan:   Sepsis due to colitis presenting with hypothermia hypotension and lactic acidosis: Chest x-ray with hazy atelectasis of lung bases. CT showed colitis of ascending and transverse colon.  Patient is hemodynamically stable, afebrile, no hypothermia.  Culture data shows no growth and urine cultures.  No blood culture drawn in ED. COVID-19 negative.  On vancomycin cefepime and Flagyl. procalcitonin 0.16.  Will discontinue vancomycin change to ceftriaxone and Flagyl.  Continue soft diet tolerating well.  Acute lower UTI: Urine culture no growth.  Antibiotics as above.  ? HCAP/15 mm opacity in the right middle lobe.  Clinically improving current antibiotics as above.  Follow-up imaging I n 4-6 wks  Acute metabolic encephalopathy: Presented with confusion likely in the setting of UTI/sepsis.  Patient is hard of hearing, overnight confusion noted needing one-to-one tele supervision.CT brain on admission negative. She is nonfocal on exam.Continue supportive care, fall precaution. Per  legal guardian she is bright/well mentation. More agitated and trying to get out of the bed per nursing staff , will add Haldol 1 mg IM x1, reviewed QTC and normal on 1/2  Type II diabetes mellitus with renal manifestations: Blood sugar well controlled, continue sliding scale insulin.Metformin stopped due to lactic acidosis.  Hypertension: Blood pressure fairly controlled.  Poorly controlled yesterday, continue prn meds.Continue hydralazine verapamil.   History of stroke continue aspirin, statin. Hyperlipidemia: Continue her Lipitor. Anemia likely from chronic disease.  Hemoglobin is stable.  Continue iron supplementation.  GERD: Continue PPI.  Hyponatremia: Resolved.  Metabolic acidosis with bicarbonate 16-improved to 20. Cont po diet.  Body mass index is 26.77 kg/m.   DVT prophylaxis:lovenox. Code Status:DNR.  Family Communication: Plan of care discussed with patient at bedside.Called her legal guardian [name] and updated.[facility]greeable for [facility] or other places. Brother who deals with finance for her and his name and no is: Mallie Mussel [phone] and SW can reach there to d/w. SW to call POA.  Disposition Plan:SNF when available. Per POA currently at current ALF- may not be able to provide 24 hr assistance/supervision. SW to look into.  Consultants: none Procedures:none Microbiology:  Antimicrobials: Anti-infectives (From admission, onward)   Start     Dose/Rate Route Frequency Ordered Stop   03/31/19 2200  cefTRIAXone (ROCEPHIN) 1 g in sodium chloride 0.9 % 100 mL IVPB  Status:  Discontinued     1 g 200 mL/hr over 30 Minutes Intravenous Every 24 hours 03/30/19 2228 03/31/19 0112   03/31/19 1600  metroNIDAZOLE (FLAGYL) IVPB 500 mg     500 mg 100 mL/hr over 60 Minutes Intravenous Every 8  hours 03/31/19 1533     03/31/19 0200  ceFEPIme (MAXIPIME) 2 g in sodium chloride 0.9 % 100 mL IVPB     2 g 200 mL/hr over 30 Minutes Intravenous Every 12 hours 03/31/19  0143     03/31/19 0145  vancomycin (VANCOREADY) IVPB 750 mg/150 mL     750 mg 150 mL/hr over 60 Minutes Intravenous Every 24 hours 03/31/19 0143     03/30/19 2200  cefTRIAXone (ROCEPHIN) 1 g in sodium chloride 0.9 % 100 mL IVPB     1 g 200 mL/hr over 30 Minutes Intravenous  Once 03/30/19 2155 03/30/19 2300       Objective: Vitals:   04/02/19 0100 04/02/19 0215 04/02/19 0438 04/02/19 0811  BP:    140/70  Pulse:    84  Resp: 19 18 (!) 26 16  Temp:    98.2 F (36.8 C)  TempSrc:    Oral  SpO2:    92%  Weight:   66.4 kg   Height:        Intake/Output Summary (Last 24 hours) at 04/02/2019 1149 Last data filed at 04/02/2019 0154 Gross per 24 hour  Intake 425.67 ml  Output 2850 ml  Net -2424.33 ml   Filed Weights   03/30/19 2036 04/01/19 0355 04/02/19 0438  Weight: 72.6 kg 71.9 kg 66.4 kg   Weight change: -5.5 kg  Body mass index is 26.77 kg/m.  Intake/Output from previous day: 01/04 0701 - 01/05 0700 In: 425.7 [P.O.:60; IV Piggyback:365.7] Out: 2850 [Urine:2850] Intake/Output this shift: No intake/output data recorded.  Examination:  General exam: AA oriented to place, on RA, NAD, Weak appearing. HEENT:Oral mucosa moist, Ear/Nose WNL grossly, dentition normal. Respiratory system: Diminished at the base,no wheezing or crackles,no use of accessory muscle Cardiovascular system: S1 & S2 +, No JVD,. Gastrointestinal system: Abdomen soft, NT,ND, BS+ Nervous System:Alert, awake, moving extremities and grossly nonfocal- moving all extremities well. Extremities: No edema, distal peripheral pulses palpable.  Skin: No rashes,no icterus. MSK: Normal muscle bulk,tone, power  Medications:  Scheduled Meds: . aspirin EC  81 mg Oral Daily  . atorvastatin  20 mg Oral QPM  . cholecalciferol  2,000 Units Oral Daily  . enoxaparin (LOVENOX) injection  40 mg Subcutaneous Daily  . ferrous Q000111Q C-folic acid  1 capsule Oral Daily  . hydrALAZINE  10 mg Oral TID  .  insulin aspart  0-5 Units Subcutaneous QHS  . insulin aspart  0-9 Units Subcutaneous TID WC  . pantoprazole  40 mg Oral Daily  . verapamil  120 mg Oral QHS   Continuous Infusions: . ceFEPime (MAXIPIME) IV 2 g (04/02/19 0907)  . metronidazole 500 mg (04/02/19 1017)  . vancomycin 750 mg (04/02/19 0154)    Data Reviewed: I have personally reviewed following labs and imaging studies  CBC: Recent Labs  Lab 03/30/19 2049 03/31/19 0424 04/01/19 0922 04/02/19 0428  WBC 10.9* 9.1 8.6 8.4  NEUTROABS 8.0*  --   --   --   HGB 12.1 10.7* 11.4* 11.3*  HCT 40.5 35.6* 38.1 36.0  MCV 86.2 84.6 84.9 81.8  PLT 325 256 237 Q000111Q   Basic Metabolic Panel: Recent Labs  Lab 03/30/19 2049 03/31/19 0424 04/01/19 0922 04/02/19 0428  NA 133* 138 135 134*  K 4.3 4.4 4.1 4.4  CL 105 107 106 103  CO2 17* 21* 16* 20*  GLUCOSE 152* 137* 283* 162*  BUN 28* 21 13 12   CREATININE 1.16* 0.86 0.96 0.78  CALCIUM 8.7* 8.7*  8.6* 8.8*   GFR: Estimated Creatinine Clearance: 45.1 mL/min (by C-G formula based on SCr of 0.78 mg/dL). Liver Function Tests: Recent Labs  Lab 03/30/19 2049 03/31/19 0424 04/01/19 0922 04/02/19 0428  AST 20 17 23  43*  ALT 21 16 19 28   ALKPHOS 81 71 82 81  BILITOT 0.4 0.4 0.3 0.9  PROT 6.1* 5.4* 5.7* 5.9*  ALBUMIN 3.0* 2.7* 2.7* 2.8*   No results for input(s): LIPASE, AMYLASE in the last 168 hours. No results for input(s): AMMONIA in the last 168 hours. Coagulation Profile: No results for input(s): INR, PROTIME in the last 168 hours. Cardiac Enzymes: No results for input(s): CKTOTAL, CKMB, CKMBINDEX, TROPONINI in the last 168 hours. BNP (last 3 results) No results for input(s): PROBNP in the last 8760 hours. HbA1C: No results for input(s): HGBA1C in the last 72 hours. CBG: Recent Labs  Lab 04/01/19 0750 04/01/19 1153 04/01/19 1714 04/01/19 2223 04/02/19 0830  GLUCAP 144* 180* 158* 179* 170*   Lipid Profile: No results for input(s): CHOL, HDL, LDLCALC, TRIG,  CHOLHDL, LDLDIRECT in the last 72 hours. Thyroid Function Tests: No results for input(s): TSH, T4TOTAL, FREET4, T3FREE, THYROIDAB in the last 72 hours. Anemia Panel: No results for input(s): VITAMINB12, FOLATE, FERRITIN, TIBC, IRON, RETICCTPCT in the last 72 hours. Sepsis Labs: Recent Labs  Lab 03/30/19 2049 03/30/19 2253 04/02/19 0428  PROCALCITON  --   --  0.16  LATICACIDVEN 6.7* 5.7*  --     Recent Results (from the past 240 hour(s))  Urine culture     Status: None   Collection Time: 03/30/19  8:41 PM   Specimen: In/Out Cath Urine  Result Value Ref Range Status   Specimen Description IN/OUT CATH URINE  Final   Special Requests NONE  Final   Culture   Final    NO GROWTH Performed at [facility], [address], [place], [place] [address]    Report Status 04/01/2019 FINAL  Final  SARS CORONAVIRUS 2 (TAT 6-24 HRS) Nasopharyngeal Nasopharyngeal Swab     Status: None   Collection Time: 03/31/19  4:59 AM   Specimen: Nasopharyngeal Swab  Result Value Ref Range Status   SARS Coronavirus 2 NEGATIVE NEGATIVE Final    Comment: (NOTE) SARS-CoV-2 target nucleic acids are NOT DETECTED. The SARS-CoV-2 RNA is generally detectable in upper and lower respiratory specimens during the acute phase of infection. Negative results do not preclude SARS-CoV-2 infection, do not rule out co-infections with other pathogens, and should not be used as the sole basis for treatment or other patient management decisions. Negative results must be combined with clinical observations, patient history, and epidemiological information. The expected result is Negative. Fact Sheet for Patients: SugarRoll.be Fact Sheet for Healthcare Providers: https://www.woods-mathews.com/ This test is not yet approved or cleared by the [place] FDA and  has been authorized for detection and/or diagnosis of SARS-CoV-2 by FDA under an Emergency Use Authorization (EUA).  This EUA will remain  in effect (meaning this test can be used) for the duration of the COVID-19 declaration under Section 56 4(b)(1) of the Act, 21 U.S.C. section 360bbb-3(b)(1), unless the authorization is terminated or revoked sooner. Performed at [facility], [place] [address], [place],  [address]       Radiology Studies: No results found.    LOS: 3 days   Time spent: More than 50% of that time was spent in counseling and/or coordination of care.  Antonieta Pert, MD [facility]  04/02/2019, 11:49  AM

## 2019-04-02 NOTE — Progress Notes (Signed)
CSW spoke with Engineer, manufacturing at [employer]. She is going to discuss patient with their nurse to see if patient can return.   Percell Locus Labrian Torregrossa LCSW [phone]

## 2019-04-02 NOTE — Progress Notes (Signed)
Occupational Therapy Evaluation Patient Details Name: Lisa Villegas MRN: [id number] DOB: [birth year] Today's Date: 04/02/2019    History of Present Illness 86 y.o. female presents with AMS and low bp. Pt found to have colitis with associated sepsis and lactic acidosis. CT 1/2 hazy atelectasis of lung bases UTI (+) and acute metabolic encephalopathy  pmh: of autism, hypertension, hyperlipidemia, Dm2, CKD stage 3, Diabetic neuropathy, Chronic diastolic CHF, h/o CVA, Anemia,    Clinical Impression   Pt poor historian, due to chart, PTA, pt was living at ALF and lead a sedentary lifestyle. She  Used a RW for functional mobility. Pt currently with limited conversational engagement during session. She required minA to progress to EOB and required maxA to stand from edge of bed and side step toward the head of the bed with max cues for sequencing steps. Due to decline in current level of function, pt would benefit from acute OT to address established goals to facilitate safe D/C to venue listed below. At this time, recommend SNF follow-up, unless ALF can provide 24/7 physical assistance. Will continue to follow acutely.     Follow Up Recommendations  SNF;Supervision/Assistance - 24 hour(unless ALF can provide 24/7)    Equipment Recommendations  3 in 1 bedside commode    Recommendations for Other Services       Precautions / Restrictions Precautions Precautions: Fall Precaution Comments: confusion, bringing legs over bed rail Restrictions Weight Bearing Restrictions: No      Mobility Bed Mobility Overal bed mobility: Needs Assistance Bed Mobility: Rolling;Supine to Sit;Sit to Supine Rolling: Supervision(pt moving on own in her bed)   Supine to sit: Min assist;HOB elevated Sit to supine: Min assist   General bed mobility comments: pt attempted to climb out of bed, however not responsive to commands for supine to sit, pt impulsively returning self to lying down  Transfers Overall  transfer level: Needs assistance Equipment used: Rolling walker (2 wheeled);2 person hand held assist Transfers: Sit to/from Stand Sit to Stand: Max assist Stand pivot transfers: Max assist;+2 physical assistance       General transfer comment: maxA to stand from EOB, required maxA and max cues to side step along EOB to advance higher in the bed;    Balance Overall balance assessment: Needs assistance Sitting-balance support: Feet supported;Bilateral upper extremity supported Sitting balance-Leahy Scale: Poor Sitting balance - Comments: dependent on physical assist Postural control: Posterior lean Standing balance support: Bilateral upper extremity supported Standing balance-Leahy Scale: Zero Standing balance comment: dependent on physical assist                           ADL either performed or assessed with clinical judgement   ADL Overall ADL's : Needs assistance/impaired Eating/Feeding: Minimal assistance;Sitting Eating/Feeding Details (indicate cue type and reason): minA for cues to take medication Grooming: Set up;Sitting   Upper Body Bathing: Minimal assistance;Sitting   Lower Body Bathing: Maximal assistance;Sit to/from stand   Upper Body Dressing : Minimal assistance;Sitting   Lower Body Dressing: Maximal assistance;Sit to/from stand   Toilet Transfer: Maximal assistance;Stand-pivot Toilet Transfer Details (indicate cue type and reason): simulated Toileting- Clothing Manipulation and Hygiene: Maximal assistance;Sit to/from stand       Functional mobility during ADLs: Maximal assistance;Rolling walker General ADL Comments: maxA to stand from EOB and side step along edge of bed to reposition;pt required max cues for sequencing and maxA for stability;pt with decreased initiation of tasks     Vision  Perception     Praxis      Pertinent Vitals/Pain Pain Assessment: Faces Faces Pain Scale: No hurt Pain Intervention(s): Monitored during  session     Hand Dominance Right   Extremity/Trunk Assessment Upper Extremity Assessment Upper Extremity Assessment: RUE deficits/detail;LUE deficits/detail RUE Deficits / Details: tremor noted when pt attempted to grasp cup of pills, decreased grip strength noted as well;she required increased time and effort to bring cup to mouth;able to hold cup of water with increased effort;grossly 3+/5 RUE Coordination: decreased fine motor;decreased gross motor LUE Deficits / Details: no tremor noted, but pt with gross strength 3+/5  LUE Coordination: decreased gross motor;decreased fine motor   Lower Extremity Assessment Lower Extremity Assessment: Generalized weakness   Cervical / Trunk Assessment Cervical / Trunk Assessment: Kyphotic   Communication Communication Communication: Expressive difficulties(pt not engaging in conversation this date)   Cognition Arousal/Alertness: Awake/alert Behavior During Therapy: Restless;Impulsive Overall Cognitive Status: Impaired/Different from baseline Area of Impairment: Orientation;Attention;Following commands;Safety/judgement;Problem solving                 Orientation Level: Disoriented to;Place;Time;Situation(could only state name and birthdate) Current Attention Level: Focused   Following Commands: Follows one step commands inconsistently;Follows one step commands with increased time Safety/Judgement: Decreased awareness of safety(pt trying to get out of bed/climb over top of bedrail)   Problem Solving: Slow processing;Difficulty sequencing;Requires verbal cues;Requires tactile cues General Comments: Pt does not engage conversationally, but will answer questions;pt requires max cues for sequencing during ADL and functional mobility;requires cues to swallow pills and take sips of water with RN present   General Comments  vss    Exercises     Shoulder Instructions      Home Living Family/patient expects to be discharged to:: Assisted  living                             Home Equipment: Walker - 2 wheels   Additional Comments: pt poor historian/not answering questions with exception of name and birthdate. Per RN pt from ALF and has a PT that follows her there. RN reports that patient lives sedentary life and when she does get up is heavily depencent on RW      Prior Functioning/Environment Level of Independence: Needs assistance  Gait / Transfers Assistance Needed: per RN pt dependent on walker, needs max encouragement to particiapte in ambulation ADL's / Homemaking Assistance Needed: pt requires assist for all ADLS            OT Problem List: Decreased range of motion;Decreased activity tolerance;Impaired balance (sitting and/or standing);Decreased safety awareness;Decreased cognition;Decreased knowledge of use of DME or AE;Decreased knowledge of precautions      OT Treatment/Interventions: Self-care/ADL training;Therapeutic exercise;DME and/or AE instruction;Therapeutic activities;Cognitive remediation/compensation;Patient/family education;Balance training    OT Goals(Current goals can be found in the care plan section) Acute Rehab OT Goals Patient Stated Goal: pt did not state OT Goal Formulation: With patient Time For Goal Achievement: 04/16/19 Potential to Achieve Goals: Good ADL Goals Pt Will Perform Grooming: sitting;with modified independence Pt Will Perform Upper Body Dressing: with supervision;sitting Pt Will Perform Lower Body Dressing: with min guard assist;sit to/from stand Pt Will Transfer to Toilet: with min guard assist;ambulating  OT Frequency: Min 2X/week   Barriers to D/C: Decreased caregiver support          Co-evaluation              AM-PAC OT "6 Clicks" Daily Activity  Outcome Measure Help from another person eating meals?: A Little Help from another person taking care of personal grooming?: A Little Help from another person toileting, which includes using  toliet, bedpan, or urinal?: A Lot Help from another person bathing (including washing, rinsing, drying)?: A Lot Help from another person to put on and taking off regular upper body clothing?: A Little Help from another person to put on and taking off regular lower body clothing?: A Lot 6 Click Score: 15   End of Session Equipment Utilized During Treatment: Gait belt;Rolling walker Nurse Communication: Mobility status  Activity Tolerance: Patient tolerated treatment well Patient left: in bed;with call bell/phone within reach;with bed alarm set  OT Visit Diagnosis: Unsteadiness on feet (R26.81);Other abnormalities of gait and mobility (R26.89);Muscle weakness (generalized) (M62.81);Other symptoms and signs involving cognitive function                Time: [id number] OT Time Calculation (min): 18 min Charges:  OT General Charges $OT Visit: 1 Visit OT Evaluation $OT Eval Moderate Complexity: [place]y OTR/L Acute Rehabilitation Services Office: [place] 04/02/2019, 11:13 AM

## 2019-04-03 DIAGNOSIS — K219 Gastro-esophageal reflux disease without esophagitis: Secondary | ICD-10-CM

## 2019-04-03 LAB — COMPREHENSIVE METABOLIC PANEL
ALT: 88 U/L — ABNORMAL HIGH (ref 0–44)
AST: 105 U/L — ABNORMAL HIGH (ref 15–41)
Albumin: 2.6 g/dL — ABNORMAL LOW (ref 3.5–5.0)
Alkaline Phosphatase: 69 U/L (ref 38–126)
Anion gap: 10 (ref 5–15)
BUN: 12 mg/dL (ref 8–23)
CO2: 21 mmol/L — ABNORMAL LOW (ref 22–32)
Calcium: 8.4 mg/dL — ABNORMAL LOW (ref 8.9–10.3)
Chloride: 104 mmol/L (ref 98–111)
Creatinine, Ser: 0.72 mg/dL (ref 0.44–1.00)
GFR calc Af Amer: >60 mL/min (ref >60)
GFR calc non Af Amer: >60 mL/min (ref >60)
Glucose, Bld: 141 mg/dL — ABNORMAL HIGH (ref 70–99)
Potassium: 3.6 mmol/L (ref 3.5–5.1)
Sodium: 135 mmol/L (ref 135–145)
Total Bilirubin: 0.5 mg/dL (ref 0.3–1.2)
Total Protein: 5.7 g/dL — ABNORMAL LOW (ref 6.5–8.1)

## 2019-04-03 LAB — CBC
HCT: 34.2 % — ABNORMAL LOW (ref 36.0–46.0)
Hemoglobin: 10.4 g/dL — ABNORMAL LOW (ref 12.0–15.0)
MCH: 25.6 pg — ABNORMAL LOW (ref 26.0–34.0)
MCHC: 30.4 g/dL (ref 30.0–36.0)
MCV: 84 fL (ref 80.0–100.0)
Platelets: 202 10*3/uL (ref 150–400)
RBC: 4.07 MIL/uL (ref 3.87–5.11)
RDW: 16.3 % — ABNORMAL HIGH (ref 11.5–15.5)
WBC: 4.8 10*3/uL (ref 4.0–10.5)
nRBC: 0 % (ref 0.0–0.2)

## 2019-04-03 LAB — GLUCOSE, CAPILLARY
Glucose-Capillary: 123 mg/dL — ABNORMAL HIGH (ref 70–99)
Glucose-Capillary: 125 mg/dL — ABNORMAL HIGH (ref 70–99)
Glucose-Capillary: 93 mg/dL (ref 70–99)
Glucose-Capillary: 133 mg/dL — ABNORMAL HIGH (ref 70–99)

## 2019-04-03 LAB — SARS CORONAVIRUS 2 (TAT 6-24 HRS): SARS Coronavirus 2: POSITIVE — AB

## 2019-04-03 LAB — PROCALCITONIN: Procalcitonin: 0.11 ng/mL

## 2019-04-03 NOTE — Progress Notes (Signed)
PROGRESS NOTE    Lisa Villegas  SSN-[id number] DOB: 07-16-1930 DOA: 03/30/2019 PCP: [name], MD   Brief Narrative: As per HPI: 86 y.o.female,w hx of autism, hypertension, hyperlipidemia, Dm2, CKD stage 3, Diabetic neuropathy, Chronic diastolic CHF, h/o CVA, Anemia, apparently presents with AMS and low bp  In the ER- vitals stable with T 96.5, P 74 R 18, Bp 135/54 pox 100% on RA Wt 72.6kg. imaging CT brain had no acute abnormalities.CT of abdomen pelvis concerning for colitis of ascending and transverse colon.Chest x-ray showing hazy atelectasis of lung bases Patient was a started on Vanco cefepime and was admitted.  Subjective:  Patient has been confused during the night this morning not making eye contact covering herself reports "I am fine", able to tell me she is in the hospital. Tolerating diet. Febrile, WBC 4.8K stable  Assessment & Plan:   Sepsis due to colitis presenting with hypothermia hypotension and lactic acidosis: Chest x-ray with hazy atelectasis of lung bases. CT showed colitis of ascending and transverse colon.  Patient is hemodynamically stable, afebrile, no hypothermia.  Culture data shows no growth and urine cultures.  No blood culture drawn in ED. COVID-19 negative.  On vancomycin cefepime and Flagyl. procalcitonin 0.16.  Discontinued vancomycin 1/5-changed to ceftriaxone and cont Flagyl.Continue soft diet.  No bowel movement and, start a stool softener.   Acute lower UTI: Urine culture no growth.  Antibiotics as above.  ? HCAP/15 mm opacity in the right middle lobe: resp status stable, afebrile and no leukocytosis. Follow-up imaging I n 4-6 wks.  Acute metabolic encephalopathy: Presented with confusion likely in the setting of UTI/sepsis.  Patient is hard of hearing. still appears confused this morning.  Cont on one-to-one tele supervision. CT brain on admission negative. She is nonfocal on exam.Continue supportive care, fall precaution. Per legal  guardian she is bright/well mentation at baseline. s/p Haldol 1 mg IM x1 1/5.QTC was normal on 1/2.  Type II diabetes mellitus with renal manifestations:Blood sugar well controlled, continue sliding scale insulin.Metformin stopped due to lactic acidosis. Recent Labs  Lab 04/01/19 2223 04/02/19 0830 04/02/19 1206 04/02/19 1629 04/02/19 2153  GLUCAP 179* 170* 137* 117* 114*   Hypertension: BP intermittently borderline controlled, continue prn meds.Continue home hydralazine verapamil.   History of stroke continue aspirin, statin. Hyperlipidemia: Continue her Lipitor. Anemia likely from chronic disease.  Hemoglobin is stable.  Continue iron supplementation.  GERD: Continue PPI.  Hyponatremia: Resolved.  Metabolic acidosis with bicarbonate 16-improved to 21. Cont po diet.  Body mass index is 25.4 kg/m.   DVT prophylaxis:lovenox. Code Status:DNR.  Family Communication: Plan of care discussed with legal guardian [name] and was updated 1/5-agreeable for SNF at [facility] or other places. Brother who deals with finance for her and his name and no is: Mallie Mussel Pourciau:[phone] and SW can reach there to d/w.   Disposition Plan: Planning on a skilled nursing facility once her mental status improves.  Social worker looking into skilled nursing facility.  Consultants: none Procedures:none Microbiology:  Antimicrobials: Anti-infectives (From admission, onward)   Start     Dose/Rate Route Frequency Ordered Stop   04/02/19 1200  cefTRIAXone (ROCEPHIN) 2 g in sodium chloride 0.9 % 100 mL IVPB     2 g 200 mL/hr over 30 Minutes Intravenous Every 24 hours 04/02/19 1158     03/31/19 2200  cefTRIAXone (ROCEPHIN) 1 g in sodium chloride 0.9 % 100 mL IVPB  Status:  Discontinued     1 g 200 mL/hr over 30  Minutes Intravenous Every 24 hours 03/30/19 2228 03/31/19 0112   03/31/19 1600  metroNIDAZOLE (FLAGYL) IVPB 500 mg     500 mg 100 mL/hr over 60 Minutes Intravenous Every 8 hours  03/31/19 1533     03/31/19 0200  ceFEPIme (MAXIPIME) 2 g in sodium chloride 0.9 % 100 mL IVPB  Status:  Discontinued     2 g 200 mL/hr over 30 Minutes Intravenous Every 12 hours 03/31/19 0143 04/02/19 1158   03/31/19 0145  vancomycin (VANCOREADY) IVPB 750 mg/150 mL  Status:  Discontinued     750 mg 150 mL/hr over 60 Minutes Intravenous Every 24 hours 03/31/19 0143 04/02/19 1158   03/30/19 2200  cefTRIAXone (ROCEPHIN) 1 g in sodium chloride 0.9 % 100 mL IVPB     1 g 200 mL/hr over 30 Minutes Intravenous  Once 03/30/19 2155 03/30/19 2300       Objective: Vitals:   04/02/19 0811 04/02/19 1626 04/02/19 2309 04/03/19 0500  BP: 140/70 (!) 160/69 (!) 152/77   Pulse: 84 80 75   Resp: 16 18 16    Temp: 98.2 F (36.8 C) 98.3 F (36.8 C) 98 F (36.7 C)   TempSrc: Oral Oral Axillary   SpO2: 92% 92% 99%   Weight:    63 kg  Height:        Intake/Output Summary (Last 24 hours) at 04/03/2019 0751 Last data filed at 04/03/2019 0515 Gross per 24 hour  Intake 430.25 ml  Output 1000 ml  Net -569.75 ml   Filed Weights   04/01/19 0355 04/02/19 0438 04/03/19 0500  Weight: 71.9 kg 66.4 kg 63 kg   Weight change: -3.4 kg  Body mass index is 25.4 kg/m.  Intake/Output from previous day: 01/05 0701 - 01/06 0700 In: 430.3 [P.O.:120; IV Piggyback:310.3] Out: 1000 [Urine:1000] Intake/Output this shift: No intake/output data recorded.  Examination:  General exam: Alert awake oriented to self, place, not making eye contact, covering herself.   HEENT:Oral mucosa moist, Ear/Nose WNL grossly, dentition normal. Respiratory system: Bilaterally diminished breath sounds no wheezing no crackles, no wheezing or crackles,no use of accessory muscle Cardiovascular system: S1 & S2 +, No JVD,. Gastrointestinal system: Abdomen soft, nondistended and bowel sounds present.  Nervous System: Nonfocal on exam alert awake speech normal, hard of hearing Extremities: No edema, distal peripheral pulses palpable.    Skin: No rashes,no icterus. MSK: Normal muscle bulk,tone, power  Medications:  Scheduled Meds: . aspirin EC  81 mg Oral Daily  . atorvastatin  20 mg Oral QPM  . cholecalciferol  2,000 Units Oral Daily  . enoxaparin (LOVENOX) injection  40 mg Subcutaneous Daily  . ferrous [id number] C-folic acid  1 capsule Oral Daily  . hydrALAZINE  10 mg Oral TID  . insulin aspart  0-5 Units Subcutaneous QHS  . insulin aspart  0-9 Units Subcutaneous TID WC  . pantoprazole  40 mg Oral Daily  . verapamil  120 mg Oral QHS   Continuous Infusions: . cefTRIAXone (ROCEPHIN)  IV 2 g (04/02/19 1915)  . metronidazole 500 mg (04/02/19 2318)    Data Reviewed: I have personally reviewed following labs and imaging studies  CBC: Recent Labs  Lab 03/30/19 2049 03/31/19 0424 04/01/19 0922 04/02/19 0428 04/03/19 0330  WBC 10.9* 9.1 8.6 8.4 4.8  NEUTROABS 8.0*  --   --   --   --   HGB 12.1 10.7* 11.4* 11.3* 10.4*  HCT 40.5 35.6* 38.1 36.0 34.2*  MCV 86.2 84.6 84.9 81.8 84.0  PLT 325  256 237 283 [id number]   Basic Metabolic Panel: Recent Labs  Lab 03/30/19 2049 03/31/19 0424 04/01/19 0922 04/02/19 0428 04/03/19 0330  NA 133* 138 135 134* 135  K 4.3 4.4 4.1 4.4 3.6  CL 105 107 106 103 104  CO2 17* 21* 16* 20* 21*  GLUCOSE 152* 137* 283* 162* 141*  BUN 28* 21 13 12 12   CREATININE 1.16* 0.86 0.96 0.78 0.72  CALCIUM 8.7* 8.7* 8.6* 8.8* 8.4*   GFR: Estimated Creatinine Clearance: 44.1 mL/min (by C-G formula based on SCr of 0.72 mg/dL). Liver Function Tests: Recent Labs  Lab 03/30/19 2049 03/31/19 0424 04/01/19 0922 04/02/19 0428 04/03/19 0330  AST 20 17 23  43* 105*  ALT 21 16 19 28  88*  ALKPHOS 81 71 82 81 69  BILITOT 0.4 0.4 0.3 0.9 0.5  PROT 6.1* 5.4* 5.7* 5.9* 5.7*  ALBUMIN 3.0* 2.7* 2.7* 2.8* 2.6*   No results for input(s): LIPASE, AMYLASE in the last 168 hours. No results for input(s): AMMONIA in the last 168 hours. Coagulation Profile: No results for input(s): INR,  PROTIME in the last 168 hours. Cardiac Enzymes: No results for input(s): CKTOTAL, CKMB, CKMBINDEX, TROPONINI in the last 168 hours. BNP (last 3 results) No results for input(s): PROBNP in the last 8760 hours. HbA1C: No results for input(s): HGBA1C in the last 72 hours. CBG: Recent Labs  Lab 04/01/19 2223 04/02/19 0830 04/02/19 1206 04/02/19 1629 04/02/19 2153  GLUCAP 179* 170* 137* 117* 114*   Lipid Profile: No results for input(s): CHOL, HDL, LDLCALC, TRIG, CHOLHDL, LDLDIRECT in the last 72 hours. Thyroid Function Tests: No results for input(s): TSH, T4TOTAL, FREET4, T3FREE, THYROIDAB in the last 72 hours. Anemia Panel: No results for input(s): VITAMINB12, FOLATE, FERRITIN, TIBC, IRON, RETICCTPCT in the last 72 hours. Sepsis Labs: Recent Labs  Lab 03/30/19 2049 03/30/19 2253 04/02/19 0428 04/03/19 0330  PROCALCITON  --   --  0.16 0.11  LATICACIDVEN 6.7* 5.7*  --   --     Recent Results (from the past 240 hour(s))  Urine culture     Status: None   Collection Time: 03/30/19  8:41 PM   Specimen: In/Out Cath Urine  Result Value Ref Range Status   Specimen Description IN/OUT CATH URINE  Final   Special Requests NONE  Final   Culture   Final    NO GROWTH Performed at [facility], [address] [place], [place] [address]    Report Status 04/01/2019 FINAL  Final  SARS CORONAVIRUS 2 (TAT 6-24 HRS) Nasopharyngeal Nasopharyngeal Swab     Status: None   Collection Time: 03/31/19  4:59 AM   Specimen: Nasopharyngeal Swab  Result Value Ref Range Status   SARS Coronavirus 2 NEGATIVE NEGATIVE Final    Comment: (NOTE) SARS-CoV-2 target nucleic acids are NOT DETECTED. The SARS-CoV-2 RNA is generally detectable in upper and lower respiratory specimens during the acute phase of infection. Negative results do not preclude SARS-CoV-2 infection, do not rule out co-infections with other pathogens, and should not be used as the sole basis for treatment or other patient  management decisions. Negative results must be combined with clinical observations, patient history, and epidemiological information. The expected result is Negative. Fact Sheet for Patients: SugarRoll.be Fact Sheet for Healthcare Providers: https://www.woods-mathews.com/ This test is not yet approved or cleared by the [place] FDA and  has been authorized for detection and/or diagnosis of SARS-CoV-2 by FDA under an Emergency Use Authorization (EUA). This EUA will remain  in effect (meaning this test can be used) for the duration of the COVID-19 declaration under Section 56 4(b)(1) of the Act, 21 U.S.C. section 360bbb-3(b)(1), unless the authorization is terminated or revoked sooner. Performed at [facility], [place] [address] [place], [place] [address]       Radiology Studies: No results found.    LOS: 4 days   Time spent: More than 50% of that time was spent in counseling and/or coordination of care.  Antonieta Pert, MD [facility]  04/03/2019, 7:51 AM

## 2019-04-03 NOTE — Plan of Care (Signed)

## 2019-04-03 NOTE — TOC Progression Note (Signed)
Transition of Care [facility]) - Progression Note    Patient Details  Name: Lisa Villegas MRN: [id number] Date of Birth: September 04, 1930  Transition of Care [facility]) CM/SW [place], LCSW Phone Number: 04/03/2019, 1:16 PM  Clinical Narrative:    CSW presented alternate SNF offers to [place]. She reported preference for patient to return to [facility]F as long they can work to keep her more engaged and out of bed.    Expected Discharge Plan: Assisted Living Barriers to Discharge: Continued Medical Work up, Other (comment), SNF Pending bed offer([place])  Expected Discharge Plan and Services Expected Discharge Plan: Assisted Living In-house Referral: Clinical Social Work   Post Acute Care Choice: [place] Living arrangements for the past 2 months: [place]                                       Social Determinants of Health (SDOH) Interventions    Readmission Risk Interventions Readmission Risk Prevention Plan 04/02/2019  Transportation Screening Complete  PCP or Specialist Appt within 3-5 Days Complete  HRI or [place] Complete  Social Work Consult for [place] Planning/Counseling Complete  Palliative Care Screening Not Applicable  Medication Review Press photographer) Referral to Pharmacy  Some recent data might be hidden

## 2019-04-03 NOTE — Progress Notes (Signed)
CRITICAL VALUE STICKER  CRITICAL VALUE: COVID Positive   RECEIVER (on-site recipient of call): [place] NOTIFIED: 04/03/2019 1056   MD NOTIFIED: Benjamine Mola   TIME OF NOTIFICATION: 1100  RESPONSE: Transfer to Covid room
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
## 2019-04-03 NOTE — Progress Notes (Signed)
COVID swab obtained and sent this shift.

## 2019-04-04 DIAGNOSIS — R7989 Other specified abnormal findings of blood chemistry: Secondary | ICD-10-CM

## 2019-04-04 DIAGNOSIS — U071 COVID-19: Secondary | ICD-10-CM

## 2019-04-04 LAB — C-REACTIVE PROTEIN: CRP: 1.7 mg/dL — ABNORMAL HIGH (ref <1.0)

## 2019-04-04 LAB — COMPREHENSIVE METABOLIC PANEL
ALT: 110 U/L — ABNORMAL HIGH (ref 0–44)
AST: 104 U/L — ABNORMAL HIGH (ref 15–41)
Albumin: 2.4 g/dL — ABNORMAL LOW (ref 3.5–5.0)
Alkaline Phosphatase: 69 U/L (ref 38–126)
Anion gap: 9 (ref 5–15)
BUN: 12 mg/dL (ref 8–23)
CO2: 22 mmol/L (ref 22–32)
Calcium: 8.4 mg/dL — ABNORMAL LOW (ref 8.9–10.3)
Chloride: 105 mmol/L (ref 98–111)
Creatinine, Ser: 0.79 mg/dL (ref 0.44–1.00)
GFR calc Af Amer: >60 mL/min (ref >60)
GFR calc non Af Amer: >60 mL/min (ref >60)
Glucose, Bld: 126 mg/dL — ABNORMAL HIGH (ref 70–99)
Potassium: 3.3 mmol/L — ABNORMAL LOW (ref 3.5–5.1)
Sodium: 136 mmol/L (ref 135–145)
Total Bilirubin: 0.3 mg/dL (ref 0.3–1.2)
Total Protein: 5.3 g/dL — ABNORMAL LOW (ref 6.5–8.1)

## 2019-04-04 LAB — GLUCOSE, CAPILLARY
Glucose-Capillary: 113 mg/dL — ABNORMAL HIGH (ref 70–99)
Glucose-Capillary: 282 mg/dL — ABNORMAL HIGH (ref 70–99)
Glucose-Capillary: 231 mg/dL — ABNORMAL HIGH (ref 70–99)
Glucose-Capillary: 161 mg/dL — ABNORMAL HIGH (ref 70–99)

## 2019-04-04 LAB — CBC
HCT: 36.6 % (ref 36.0–46.0)
Hemoglobin: 11 g/dL — ABNORMAL LOW (ref 12.0–15.0)
MCH: 25.2 pg — ABNORMAL LOW (ref 26.0–34.0)
MCHC: 30.1 g/dL (ref 30.0–36.0)
MCV: 83.9 fL (ref 80.0–100.0)
Platelets: 245 10*3/uL (ref 150–400)
RBC: 4.36 MIL/uL (ref 3.87–5.11)
RDW: 16.2 % — ABNORMAL HIGH (ref 11.5–15.5)
WBC: 4.2 10*3/uL (ref 4.0–10.5)
nRBC: 0 % (ref 0.0–0.2)

## 2019-04-04 LAB — LACTIC ACID, PLASMA: Lactic Acid, Venous: 0.8 mmol/L (ref 0.5–1.9)

## 2019-04-04 LAB — D-DIMER, QUANTITATIVE: D-Dimer, Quant: 0.61 ug/mL-FEU — ABNORMAL HIGH (ref 0.00–0.50)

## 2019-04-04 MED ORDER — SODIUM CHLORIDE 0.9 % IV SOLN: INTRAVENOUS | Status: AC

## 2019-04-04 MED ORDER — DOCUSATE SODIUM 100 MG PO CAPS
100.0000 mg | ORAL_CAPSULE | Freq: Every day | ORAL | Status: DC
  Administered 2019-04-04 – 2019-04-05 (×2): 100 mg via ORAL
  Filled 2019-04-04 (×2): qty 1

## 2019-04-04 NOTE — TOC Progression Note (Signed)
Transition of Care [facility]) - Progression Note    Patient Details  Name: Lisa Villegas MRN: [id number] Date of Birth: 20-Oct-1930  Transition of Care [facility]) CM/SW [place], LCSW Phone Number: 04/04/2019, 5:23 PM  Clinical Narrative:    CSW spoke with Rip Harbour at [facility]. She confirmed patient will still be able to return as COVID positive. She reports they would not be able to accept her on the weekend however. MD aware. CSW updated [place], [place].   Expected Discharge Plan: Assisted Living Barriers to Discharge: Continued Medical Work up, Other (comment), SNF Pending bed offer([facility])  Expected Discharge Plan and Services Expected Discharge Plan: Assisted Living In-house Referral: Clinical Social Work   Post Acute Care Choice: [place] Living arrangements for the past 2 months: [place]                                       Social Determinants of Health (SDOH) Interventions    Readmission Risk Interventions Readmission Risk Prevention Plan 04/02/2019  Transportation Screening Complete  PCP or Specialist Appt within 3-5 Days Complete  HRI or [place] Complete  Social Work Consult for [place] Planning/Counseling Complete  Palliative Care Screening Not Applicable  Medication Review Press photographer) Referral to Pharmacy  Some recent data might be hidden

## 2019-04-04 NOTE — Progress Notes (Signed)
Physical Therapy Treatment and Discharge Patient Details Name: Lisa Villegas MRN: [id number] DOB: May 10, 1930 Today's Date: 04/04/2019    History of Present Illness 86 y.o. female presents with AMS and low bp. Pt found to have colitis with associated sepsis and lactic acidosis. CT 1/2 hazy atelectasis of lung bases UTI (+) and acute metabolic encephalopathy  pmh: of autism, hypertension, hyperlipidemia, Dm2, CKD stage 3, Diabetic neuropathy, Chronic diastolic CHF, h/o CVA, Anemia,; 1/6 COVID +    PT Comments    Patient with very short attention span (will stop attending to task part way through). Participated with max multi-modal cues in LE exercises. Participated in coming to sit at [facility], however began to attempt to lie back down when began to work on standing. Noted per SW note that she spoke with staff at [facility] and they feel patient is at her baseline (max assist). No further PT indicated at this time. PT is signing off.     Follow Up Recommendations  Supervision/Assistance - 24 hour(per SW note, ALF can provide 24/7, max assist)     Equipment Recommendations  None recommended by PT    Recommendations for Other Services       Precautions / Restrictions Precautions Precautions: Fall Restrictions Weight Bearing Restrictions: No    Mobility  Bed Mobility Overal bed mobility: Needs Assistance Bed Mobility: Rolling;Supine to Sit;Sit to Supine Rolling: Supervision(pt moving on own in her bed)   Supine to sit: HOB elevated;Mod assist Sit to supine: Min assist   General bed mobility comments: pt required 3 attempts for supine to sit until successful (difficult to coordinate with her effort as she is slow to respond to cues and then tries on her own)  Transfers                 General transfer comment: pt resisting attempts to stand (in sitting and pushing backwards (hip and trunk extension); would not follow cues for lateral scoot to [facility]; returned to supine, lifting her  own legs up onto bed and she partially scooted up toward the pillow.  Ambulation/Gait                 Stairs             Wheelchair Mobility    Modified Rankin (Stroke Patients Only)       Balance Overall balance assessment: Needs assistance Sitting-balance support: Feet supported;Bilateral upper extremity supported Sitting balance-Leahy Scale: Fair Sitting balance - Comments: when she wanted to sit at EOB, she held herself in midline wiht close guarding x 2 minutes                                    Cognition Arousal/Alertness: Awake/alert Behavior During Therapy: Flat affect Overall Cognitive Status: No family/caregiver present to determine baseline cognitive functioning Area of Impairment: Orientation;Attention;Following commands;Safety/judgement;Problem solving                 Orientation Level: Disoriented to;Place;Time;Situation(could only state name and birthdate) Current Attention Level: Focused   Following Commands: Follows one step commands inconsistently;Follows one step commands with increased time     Problem Solving: Slow processing;Difficulty sequencing;Requires verbal cues;Requires tactile cues;Decreased initiation General Comments: Would not answer when spoken to; would initiate leg extension with resistance in supine with max multi-modal cues      Exercises General Exercises - Lower Extremity Ankle Circles/Pumps: PROM;Both;5 reps;Supine(with stretching) Quad Sets: AROM;Both;5 reps(pt  does after each heelslide with resistance and holds) Heel Slides: AAROM;Strengthening;Both;5 reps;Supine(assisted flexion; resisted extension)    General Comments        Pertinent Vitals/Pain Pain Assessment: Faces Faces Pain Scale: No hurt    Home Living                      Prior Function            PT Goals (current goals can now be found in the care plan section) Acute Rehab PT Goals Patient Stated Goal: pt did  not state Time For Goal Achievement: 04/16/19 Potential to Achieve Goals: Fair Progress towards PT goals: Not progressing toward goals - comment(patient resisting movements towards OOB or standing)    Frequency           PT Plan Discharge plan needs to be updated    Co-evaluation              AM-PAC PT "6 Clicks" Mobility   Outcome Measure  Help needed turning from your back to your side while in a flat bed without using bedrails?: A Little Help needed moving from lying on your back to sitting on the side of a flat bed without using bedrails?: A Lot Help needed moving to and from a bed to a chair (including a wheelchair)?: Total Help needed standing up from a chair using your arms (e.g., wheelchair or bedside chair)?: Total Help needed to walk in hospital room?: Total Help needed climbing 3-5 steps with a railing? : Total 6 Click Score: 9    End of Session   Activity Tolerance: Patient tolerated treatment well Patient left: with call bell/phone within reach;in bed;with bed alarm set   PT Visit Diagnosis: Unsteadiness on feet (R26.81);Repeated falls (R29.6);Muscle weakness (generalized) (M62.81);Difficulty in walking, not elsewhere classified (R26.2)     Time: 1245-1315 PT Time Calculation (min) (ACUTE ONLY): 30 min  Charges:  $Therapeutic Exercise: 8-22 mins $Therapeutic Activity: 8-22 mins                      [name], PT Pager [phone]    [name] 04/04/2019, 1:32 PM

## 2019-04-04 NOTE — Progress Notes (Signed)
PROGRESS NOTE    Lisa Villegas    Code Status: DNR  PF:[id number] DOB: Dec 16, 1930 DOA: 03/30/2019  PCP: [name], MD    Hospital Summary  86 year old female with history of autism, hypertension, hyperlipidemia, type 2 diabetes, CKD 3, diabetic neuropathy, chronic diastolic heart failure, CVA, anemia who presented with altered mental status and low blood pressure found to have mild hypothermia on admission, SPO2 100% on room air on admission.  CT brain unremarkable.  CT abdomen pelvis concerning for colitis of ascending and transverse colon.  Chest x-ray showing hazy atelectasis lung bases.  She was started on vancomycin/cefepime and was admitted for sepsis due to colitis and continued on vancomycin/cefepime/Flagyl.  Vancomycin was discontinued 1/5.  Antibiotics changed to ceftriaxone and Flagyl.  Also found to have UTI.  Metabolic encephalopathy in setting of polypharmacy.  COVID-19 +1/7, remained on room air and was not started on Covid directed treatment.  A & P   Principal Problem:   Sepsis ([place]) Active Problems:   Type II diabetes mellitus with renal manifestations (HCC)   Hypertension   Hyperlipidemia   Anemia   GERD (gastroesophageal reflux disease)   Acute lower UTI   1. Sepsis secondary to colitis a. Presented with hypothermia, hypotension and lactic acidosis with abnormal CT abdomen pelvis b. Currently day 6/7-10 antibiotics, currently ceftriaxone/Flagyl c. Still no BM d. Poor urine output and poor p.o. intake e. Gentle IV fluids f. Colace added 2. Cystitis a. Antibiotics as above 3. Possible HAP a. Afebrile, no leukocytosis, easy atelectasis at lung bases, likely not HAP at this time 4. COVID-19 a. Tolerating room air b. D-dimer and CRP minimally elevated c. Hold off on remdesivir and Decadron at this time 5. Elevated LFTs a. RUQ ultrasound 6. Acute metabolic encephalopathy a. Likely secondary to sepsis in setting of baseline autism and suspected  dementia b. Seems improved compared to previous notes c. CT brain negative on admission d. Responding to questioning today though oriented x0 e. Hold CNS acting agents such as Haldol f. Delirium precautions 7. Type 2 diabetes a. Continue sliding scale 8. Hypertension a. Continue as needed medications and home hydralazine and verapamil 9. History of CVA on aspirin, statin 10. Hyperlipidemia on Lipitor 11. Anemia of chronic disease stable  DVT prophylaxis: Lovenox Family Communication: No family at bedside Disposition Plan: Hopeful discharge back to ALF in the next day pending improvement in clinical status  Consultants  None  Procedures  None  Antibiotics   Anti-infectives (From admission, onward)   Start     Dose/Rate Route Frequency Ordered Stop   04/02/19 1200  cefTRIAXone (ROCEPHIN) 2 g in sodium chloride 0.9 % 100 mL IVPB     2 g 200 mL/hr over 30 Minutes Intravenous Every 24 hours 04/02/19 1158     03/31/19 2200  cefTRIAXone (ROCEPHIN) 1 g in sodium chloride 0.9 % 100 mL IVPB  Status:  Discontinued     1 g 200 mL/hr over 30 Minutes Intravenous Every 24 hours 03/30/19 2228 03/31/19 0112   03/31/19 1600  metroNIDAZOLE (FLAGYL) IVPB 500 mg     500 mg 100 mL/hr over 60 Minutes Intravenous Every 8 hours 03/31/19 1533     03/31/19 0200  ceFEPIme (MAXIPIME) 2 g in sodium chloride 0.9 % 100 mL IVPB  Status:  Discontinued     2 g 200 mL/hr over 30 Minutes Intravenous Every 12 hours 03/31/19 0143 04/02/19 1158   03/31/19 0145  vancomycin (VANCOREADY) IVPB 750 mg/150 mL  Status:  Discontinued     750 mg 150 mL/hr over 60 Minutes Intravenous Every 24 hours 03/31/19 0143 04/02/19 1158   03/30/19 2200  cefTRIAXone (ROCEPHIN) 1 g in sodium chloride 0.9 % 100 mL IVPB     1 g 200 mL/hr over 30 Minutes Intravenous  Once 03/30/19 2155 03/30/19 2300           Subjective   Examined at bedside no acute distress resting comfortably.  Has mitts on hands.  She seems comfortable.   She is a poor historian.  Denies any complaints of chest pain, abdominal pain, nausea or vomiting.  Per nursing this evening patient has had decreased and dark urine output as well as decreased p.o. intake.  Also has not had a BM.   Objective   Vitals:   04/03/19 2156 04/03/19 2327 04/04/19 0756 04/04/19 1644  BP: (!) 175/71 (!) 155/67 (!) 147/57 (!) 128/58  Pulse: 79 80 75 79  Resp:  18 17 16   Temp: 99.5 F (37.5 C) 98 F (36.7 C) 97.9 F (36.6 C) 98.4 F (36.9 C)  TempSrc: Oral Oral Oral Oral  SpO2: 99% 99% 98% 96%  Weight:      Height:        Intake/Output Summary (Last 24 hours) at 04/04/2019 1728 Last data filed at 04/04/2019 1404 Gross per 24 hour  Intake 598 ml  Output -  Net 598 ml   Filed Weights   04/01/19 0355 04/02/19 0438 04/03/19 0500  Weight: 71.9 kg 66.4 kg 63 kg    Examination:  Physical Exam Vitals and nursing note reviewed. Exam conducted with a chaperone present.  Constitutional:      Appearance: She is not ill-appearing.  HENT:     Head: Normocephalic.     Mouth/Throat:     Mouth: Mucous membranes are moist.  Eyes:     Extraocular Movements: Extraocular movements intact.  Cardiovascular:     Rate and Rhythm: Normal rate.  Pulmonary:     Effort: Pulmonary effort is normal. No respiratory distress.  Abdominal:     General: Abdomen is flat. There is no distension.     Tenderness: There is no abdominal tenderness.  Musculoskeletal:        General: No swelling or tenderness.     Cervical back: Normal range of motion.  Neurological:     Mental Status: She is alert. Mental status is at baseline.     Comments: AA, Ox0  Psychiatric:        Mood and Affect: Mood normal.     Data Reviewed: I have personally reviewed following labs and imaging studies  CBC: Recent Labs  Lab 03/30/19 2049 03/31/19 0424 04/01/19 0922 04/02/19 0428 04/03/19 0330 04/04/19 0700  WBC 10.9* 9.1 8.6 8.4 4.8 4.2  NEUTROABS 8.0*  --   --   --   --   --   HGB 12.1  10.7* 11.4* 11.3* 10.4* 11.0*  HCT 40.5 35.6* 38.1 36.0 34.2* 36.6  MCV 86.2 84.6 84.9 81.8 84.0 83.9  PLT 325 256 237 283 202 [id number]   Basic Metabolic Panel: Recent Labs  Lab 03/31/19 0424 04/01/19 0922 04/02/19 0428 04/03/19 0330 04/04/19 0700  NA 138 135 134* 135 136  K 4.4 4.1 4.4 3.6 3.3*  CL 107 106 103 104 105  CO2 21* 16* 20* 21* 22  GLUCOSE 137* 283* 162* 141* 126*  BUN 21 13 12 12 12   CREATININE 0.86 0.96 0.78 0.72 0.79  CALCIUM 8.7* 8.6* 8.8*  8.4* 8.4*   GFR: Estimated Creatinine Clearance: 44.1 mL/min (by C-G formula based on SCr of 0.79 mg/dL). Liver Function Tests: Recent Labs  Lab 03/31/19 0424 04/01/19 0922 04/02/19 0428 04/03/19 0330 04/04/19 0700  AST 17 23 43* 105* 104*  ALT 16 19 28  88* 110*  ALKPHOS 71 82 81 69 69  BILITOT 0.4 0.3 0.9 0.5 0.3  PROT 5.4* 5.7* 5.9* 5.7* 5.3*  ALBUMIN 2.7* 2.7* 2.8* 2.6* 2.4*   No results for input(s): LIPASE, AMYLASE in the last 168 hours. No results for input(s): AMMONIA in the last 168 hours. Coagulation Profile: No results for input(s): INR, PROTIME in the last 168 hours. Cardiac Enzymes: No results for input(s): CKTOTAL, CKMB, CKMBINDEX, TROPONINI in the last 168 hours. BNP (last 3 results) No results for input(s): PROBNP in the last 8760 hours. HbA1C: No results for input(s): HGBA1C in the last 72 hours. CBG: Recent Labs  Lab 04/03/19 1705 04/03/19 2138 04/04/19 0753 04/04/19 1118 04/04/19 1641  GLUCAP 93 133* 113* 282* 231*   Lipid Profile: No results for input(s): CHOL, HDL, LDLCALC, TRIG, CHOLHDL, LDLDIRECT in the last 72 hours. Thyroid Function Tests: No results for input(s): TSH, T4TOTAL, FREET4, T3FREE, THYROIDAB in the last 72 hours. Anemia Panel: No results for input(s): VITAMINB12, FOLATE, FERRITIN, TIBC, IRON, RETICCTPCT in the last 72 hours. Sepsis Labs: Recent Labs  Lab 03/30/19 2049 03/30/19 2253 04/02/19 0428 04/03/19 0330 04/04/19 0758  PROCALCITON  --   --  0.16 0.11  --    LATICACIDVEN 6.7* 5.7*  --   --  0.8    Recent Results (from the past 240 hour(s))  Urine culture     Status: None   Collection Time: 03/30/19  8:41 PM   Specimen: In/Out Cath Urine  Result Value Ref Range Status   Specimen Description IN/OUT CATH URINE  Final   Special Requests NONE  Final   Culture   Final    NO GROWTH Performed at [facility], [address], [place], [place] [address]    Report Status 04/01/2019 FINAL  Final  SARS CORONAVIRUS 2 (TAT 6-24 HRS) Nasopharyngeal Nasopharyngeal Swab     Status: None   Collection Time: 03/31/19  4:59 AM   Specimen: Nasopharyngeal Swab  Result Value Ref Range Status   SARS Coronavirus 2 NEGATIVE NEGATIVE Final    Comment: (NOTE) SARS-CoV-2 target nucleic acids are NOT DETECTED. The SARS-CoV-2 RNA is generally detectable in upper and lower respiratory specimens during the acute phase of infection. Negative results do not preclude SARS-CoV-2 infection, do not rule out co-infections with other pathogens, and should not be used as the sole basis for treatment or other patient management decisions. Negative results must be combined with clinical observations, patient history, and epidemiological information. The expected result is Negative. Fact Sheet for Patients: SugarRoll.be Fact Sheet for Healthcare Providers: https://www.woods-mathews.com/ This test is not yet approved or cleared by the [place] FDA and  has been authorized for detection and/or diagnosis of SARS-CoV-2 by FDA under an Emergency Use Authorization (EUA). This EUA will remain  in effect (meaning this test can be used) for the duration of the COVID-19 declaration under Section 56 4(b)(1) of the Act, 21 U.S.C. section 360bbb-3(b)(1), unless the authorization is terminated or revoked sooner. Performed at [facility], [place] [address], [place], [place] [address]   SARS CORONAVIRUS 2 (TAT 6-24 HRS)  Nasopharyngeal     Status: Abnormal   Collection Time: 04/03/19  1:27 PM  Specimen: Nasopharyngeal  Result Value Ref Range Status   SARS Coronavirus 2 POSITIVE (A) NEGATIVE Final    Comment: RESULT CALLED TO, READ BACK BY AND VERIFIED WITH: A.ANDREWS RN 2256 04/03/19 MCCORMICK K (NOTE) SARS-CoV-2 target nucleic acids are DETECTED. The SARS-CoV-2 RNA is generally detectable in upper and lower respiratory specimens during the acute phase of infection. Positive results are indicative of the presence of SARS-CoV-2 RNA. Clinical correlation with patient history and other diagnostic information is  necessary to determine patient infection status. Positive results do not rule out bacterial infection or co-infection with other viruses.  The expected result is Negative. Fact Sheet for Patients: SugarRoll.be Fact Sheet for Healthcare Providers: https://www.woods-mathews.com/ This test is not yet approved or cleared by the [place] FDA and  has been authorized for detection and/or diagnosis of SARS-CoV-2 by FDA under an Emergency Use Authorization (EUA). This EUA will remain  in effect (meaning this test can be used) for  the duration of the COVID-19 declaration under Section 564(b)(1) of the Act, 21 U.S.C. section 360bbb-3(b)(1), unless the authorization is terminated or revoked sooner. Performed at [facility], [place] [address], [place], [place] [address]          Radiology Studies: No results found.      Scheduled Meds: . aspirin EC  81 mg Oral Daily  . atorvastatin  20 mg Oral QPM  . cholecalciferol  2,000 Units Oral Daily  . enoxaparin (LOVENOX) injection  40 mg Subcutaneous Daily  . ferrous [id number] C-folic acid  1 capsule Oral Daily  . hydrALAZINE  10 mg Oral TID  . insulin aspart  0-5 Units Subcutaneous QHS  . insulin aspart  0-9 Units Subcutaneous TID WC  . pantoprazole  40 mg Oral Daily  . verapamil   120 mg Oral QHS   Continuous Infusions: . cefTRIAXone (ROCEPHIN)  IV 2 g (04/04/19 1228)  . metronidazole 500 mg (04/04/19 1716)     LOS: 5 days    Time spent: 26 minutes with over 50% of the time coordinating the patient's care    Harold Hedge, DO [facility] Pager [phone]  If 7PM-7AM, please contact night-coverage [email or web address] Password [facility] 04/04/2019, 5:28 PM

## 2019-04-05 ENCOUNTER — Inpatient Hospital Stay (HOSPITAL_COMMUNITY): Payer: Medicare Other

## 2019-04-05 LAB — COMPREHENSIVE METABOLIC PANEL
ALT: 83 U/L — ABNORMAL HIGH (ref 0–44)
AST: 53 U/L — ABNORMAL HIGH (ref 15–41)
Albumin: 2.3 g/dL — ABNORMAL LOW (ref 3.5–5.0)
Alkaline Phosphatase: 62 U/L (ref 38–126)
Anion gap: 9 (ref 5–15)
BUN: 16 mg/dL (ref 8–23)
CO2: 23 mmol/L (ref 22–32)
Calcium: 8.1 mg/dL — ABNORMAL LOW (ref 8.9–10.3)
Chloride: 107 mmol/L (ref 98–111)
Creatinine, Ser: 0.96 mg/dL (ref 0.44–1.00)
GFR calc Af Amer: >60 mL/min (ref >60)
GFR calc non Af Amer: 54 mL/min — ABNORMAL LOW (ref >60)
Glucose, Bld: 174 mg/dL — ABNORMAL HIGH (ref 70–99)
Potassium: 3.6 mmol/L (ref 3.5–5.1)
Sodium: 139 mmol/L (ref 135–145)
Total Bilirubin: 0.3 mg/dL (ref 0.3–1.2)
Total Protein: 5.3 g/dL — ABNORMAL LOW (ref 6.5–8.1)

## 2019-04-05 LAB — GLUCOSE, CAPILLARY
Glucose-Capillary: 148 mg/dL — ABNORMAL HIGH (ref 70–99)
Glucose-Capillary: 157 mg/dL — ABNORMAL HIGH (ref 70–99)
Glucose-Capillary: 118 mg/dL — ABNORMAL HIGH (ref 70–99)

## 2019-04-05 LAB — CBC
HCT: 34.3 % — ABNORMAL LOW (ref 36.0–46.0)
Hemoglobin: 10.4 g/dL — ABNORMAL LOW (ref 12.0–15.0)
MCH: 25.3 pg — ABNORMAL LOW (ref 26.0–34.0)
MCHC: 30.3 g/dL (ref 30.0–36.0)
MCV: 83.5 fL (ref 80.0–100.0)
Platelets: 252 10*3/uL (ref 150–400)
RBC: 4.11 MIL/uL (ref 3.87–5.11)
RDW: 16.2 % — ABNORMAL HIGH (ref 11.5–15.5)
WBC: 4.9 10*3/uL (ref 4.0–10.5)
nRBC: 0 % (ref 0.0–0.2)

## 2019-04-05 LAB — D-DIMER, QUANTITATIVE: D-Dimer, Quant: 0.65 ug/mL-FEU — ABNORMAL HIGH (ref 0.00–0.50)

## 2019-04-05 LAB — C-REACTIVE PROTEIN: CRP: 0.8 mg/dL (ref <1.0)

## 2019-04-05 MED ORDER — ZINC SULFATE 220 (50 ZN) MG PO CAPS: 220.0000 mg | ORAL_CAPSULE | Freq: Every day | ORAL | 0 refills | Status: DC

## 2019-04-05 MED ORDER — ASCORBIC ACID 500 MG PO TABS: 500.0000 mg | ORAL_TABLET | Freq: Every day | ORAL | 0 refills | Status: DC

## 2019-04-05 MED ORDER — ASCORBIC ACID 500 MG PO TABS: 500.0000 mg | ORAL_TABLET | Freq: Every day | ORAL | 0 refills | Status: AC

## 2019-04-05 MED ORDER — ZINC SULFATE 220 (50 ZN) MG PO CAPS: 220.0000 mg | ORAL_CAPSULE | Freq: Every day | ORAL | 0 refills | Status: AC

## 2019-04-05 MED ORDER — ASCORBIC ACID 500 MG PO TABS
500.0000 mg | ORAL_TABLET | Freq: Every day | ORAL | Status: DC
  Administered 2019-04-05: 500 mg via ORAL
  Filled 2019-04-05: qty 1

## 2019-04-05 MED ORDER — ZINC SULFATE 220 (50 ZN) MG PO CAPS
220.0000 mg | ORAL_CAPSULE | Freq: Every day | ORAL | Status: DC
  Administered 2019-04-05: 220 mg via ORAL
  Filled 2019-04-05: qty 1

## 2019-04-05 NOTE — TOC Progression Note (Addendum)
Transition of Care [facility]) - Progression Note    Patient Details  Name: Lisa Villegas MRN: [id number] Date of Birth: Nov 26, 1930  [facility] CM/SW [place], LCSW Phone Number: 04/05/2019, 8:53 AM  Clinical Narrative:    8:52am-CSW faxed clinicals to [facility] at [facility]. She is reviewing them and will contact CSW back to confirm plan.   1pm-Per Melinda, patient fine to discharge today to ALF. She is requesting scripts for Vitamin C, B, D, and Zinc. MD aware.    Expected Discharge Plan: Assisted Living Barriers to Discharge: Continued Medical Work up, Other (comment), SNF Pending bed offer([place])  Expected Discharge Plan and Services Expected Discharge Plan: Assisted Living In-house Referral: Clinical Social Work   Post Acute Care Choice: [place] Living arrangements for the past 2 months: [place]                                       Social Determinants of Health (SDOH) Interventions    Readmission Risk Interventions Readmission Risk Prevention Plan 04/02/2019  Transportation Screening Complete  PCP or Specialist Appt within 3-5 Days Complete  HRI or [place] Complete  Social Work Consult for [place] Planning/Counseling Complete  Palliative Care Screening Not Applicable  Medication Review Press photographer) Referral to Pharmacy  Some recent data might be hidden

## 2019-04-05 NOTE — Progress Notes (Signed)
report called to [place] at [place].  All questions and concerns answered.  Awaiting ptar
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
## 2019-04-05 NOTE — Discharge Summary (Signed)
Physician Discharge Summary  Lacee Schwantz SSN-[id number] DOB: Feb 15, 1931 DOA: 03/30/2019  PCP: [name], MD  Admit date: 03/30/2019 Discharge date: 04/05/2019   Code Status: DNR  Admitted From: ALF Discharged to:  SNF Discharge Condition:stable   Recommendations for Outpatient Follow-up   1. Continue isolation per CDC guidelines.  Covid 19 test + on 1/6 2. Follow-up CMP to ensure resolution of elevated LFTs 3. Monitor for signs/symptoms of COVID-19 over the coming days and return to ED if hypoxic 4. Currently no role for azithromycin, remdesivir or Decadron, but would need Covid treatment if hypoxic.  Completed antibiotics while inpatient.  Hospital Summary  86 year old female with history of autism, hypertension, hyperlipidemia, type 2 diabetes, CKD 3, diabetic neuropathy, chronic diastolic heart failure, CVA, anemia who presented with encephalopathy and low blood pressure found to have mild hypothermia on admission, SPO2 100% on room air on admission.  CT brain unremarkable.  CT abdomen pelvis concerning for colitis of ascending and transverse colon.  Chest x-ray showing hazy atelectasis lung bases.  She was started on vancomycin/cefepime and was admitted for sepsis due to colitis and continued on vancomycin/cefepime/Flagyl.  Vancomycin was discontinued 1/5.  Antibiotics changed to ceftriaxone and Flagyl.  Also found to have UTI.  Metabolic encephalopathy in setting of polypharmacy.  COVID-19 +1/6, remained on room air and was not started on Covid directed treatment.  Discharged in stable condition with 5 days of vitamin C and zinc.  A & P   Principal Problem:   Sepsis ([place]) Active Problems:   Type II diabetes mellitus with renal manifestations ([place])   Hypertension   Hyperlipidemia   Anemia   GERD (gastroesophageal reflux disease)   Acute lower UTI  1. Sepsis secondary to colitis, resolved a. Presented with hypothermia, hypotension and lactic acidosis with abnormal CT  abdomen pelvis. b. Currently afebrile, no leukocytosis and tolerating room air.  Baseline mentation. c. Completed 7 days antibiotics d. Given gentle IV fluids 1/7 e. Currently constipated, consider Colace outpatient 2. Cystitis a. Antibiotics completed as above 3. Initial concern for possible HAP a. CXR atelectasis at lung bases, likely not HAP at this time.  Found to be COVID-19 positive, this is likely chest x-ray finding 4. COVID-19 a. Tolerating room air during hospitalization b. D-dimer and CRP minimally elevated  c. Did not receive remdesivir or Decadron during hospitalization due to mild COVID-19 infection however if she becomes hypoxic post discharge would recommend return to ED for remdesivir and Decadron and further treatment  d. Discharged with 5 days zinc and vitamin C 5. Elevated LFTs, likely viral induced in setting of hepatic steatosis, improved a. RUQ ultrasound: Hepatic steatosis with small amount of echogenic debris in the gallbladder possibly biliary sludge. b. Follow-up CMP outpatient 6. Acute metabolic encephalopathy a. Likely secondary to sepsis in setting of baseline autism and suspected dementia b. Seems improved compared to previous notes c. CT brain negative on admission d. Responding to questioning today though oriented x0 e. Hold CNS acting agents such as Haldol 7. Type 2 diabetes a. Continue outpatient regimen 8. Hypertension a. Continue home hydralazine and verapamil 9. History of CVA on aspirin, statin 10. Hyperlipidemia on Lipitor 11. Anemia of chronic disease, stable   Consultants  . None  Procedures  . None  Antibiotics   Anti-infectives (From admission, onward)   Start     Dose/Rate Route Frequency Ordered Stop   04/02/19 1200  cefTRIAXone (ROCEPHIN) 2 g in sodium chloride 0.9 % 100 mL IVPB  2 g 200 mL/hr over 30 Minutes Intravenous Every 24 hours 04/02/19 1158     03/31/19 2200  cefTRIAXone (ROCEPHIN) 1 g in sodium chloride 0.9 % 100  mL IVPB  Status:  Discontinued     1 g 200 mL/hr over 30 Minutes Intravenous Every 24 hours 03/30/19 2228 03/31/19 0112   03/31/19 1600  metroNIDAZOLE (FLAGYL) IVPB 500 mg     500 mg 100 mL/hr over 60 Minutes Intravenous Every 8 hours 03/31/19 1533     03/31/19 0200  ceFEPIme (MAXIPIME) 2 g in sodium chloride 0.9 % 100 mL IVPB  Status:  Discontinued     2 g 200 mL/hr over 30 Minutes Intravenous Every 12 hours 03/31/19 0143 04/02/19 1158   03/31/19 0145  vancomycin (VANCOREADY) IVPB 750 mg/150 mL  Status:  Discontinued     750 mg 150 mL/hr over 60 Minutes Intravenous Every 24 hours 03/31/19 0143 04/02/19 1158   03/30/19 2200  cefTRIAXone (ROCEPHIN) 1 g in sodium chloride 0.9 % 100 mL IVPB     1 g 200 mL/hr over 30 Minutes Intravenous  Once 03/30/19 2155 03/30/19 2300        Subjective  Patient examined at bedside lying flat and resting comfortably on room air.  Denies any complaints at this time.  When questioned, she denied any chest pain, shortness of breath, nausea, vomiting, abdominal pain, diarrhea, urinary issues. Objective   Discharge Exam: Vitals:   04/04/19 2346 04/05/19 0737  BP: 135/60 (!) 188/65  Pulse: 77 76  Resp: (!) 25 16  Temp: 98.5 F (36.9 C) 98.3 F (36.8 C)  SpO2: 95% 100%   Vitals:   04/04/19 1644 04/04/19 2151 04/04/19 2346 04/05/19 0737  BP: (!) 128/58 (!) 151/59 135/60 (!) 188/65  Pulse: 79  77 76  Resp: 16  (!) 25 16  Temp: 98.4 F (36.9 C)  98.5 F (36.9 C) 98.3 F (36.8 C)  TempSrc: Oral  Oral Oral  SpO2: 96%  95% 100%  Weight:      Height:        Physical Exam Vitals and nursing note reviewed.  Constitutional:      Appearance: Normal appearance.  HENT:     Head: Normocephalic.     Mouth/Throat:     Comments: Moist mucous membranes Cardiovascular:     Rate and Rhythm: Normal rate and regular rhythm.  Pulmonary:     Effort: Pulmonary effort is normal. No respiratory distress.     Breath sounds: No wheezing.  Abdominal:      General: Abdomen is flat. There is no distension.     Palpations: Abdomen is soft.     Tenderness: There is no abdominal tenderness.  Musculoskeletal:        General: No swelling or tenderness.  Neurological:     Mental Status: She is alert. Mental status is at baseline.  Psychiatric:        Mood and Affect: Mood normal.     Comments: Seems at baseline autism       The results of significant diagnostics from this hospitalization (including imaging, microbiology, ancillary and laboratory) are listed below for reference.     Microbiology: Recent Results (from the past 240 hour(s))  Urine culture     Status: None   Collection Time: 03/30/19  8:41 PM   Specimen: In/Out Cath Urine  Result Value Ref Range Status   Specimen Description IN/OUT CATH URINE  Final   Special Requests NONE  Final  Culture   Final    NO GROWTH Performed at [facility], [place] [address], [place], [place] [address]    Report Status 04/01/2019 FINAL  Final  SARS CORONAVIRUS 2 (TAT 6-24 HRS) Nasopharyngeal Nasopharyngeal Swab     Status: None   Collection Time: 03/31/19  4:59 AM   Specimen: Nasopharyngeal Swab  Result Value Ref Range Status   SARS Coronavirus 2 NEGATIVE NEGATIVE Final    Comment: (NOTE) SARS-CoV-2 target nucleic acids are NOT DETECTED. The SARS-CoV-2 RNA is generally detectable in upper and lower respiratory specimens during the acute phase of infection. Negative results do not preclude SARS-CoV-2 infection, do not rule out co-infections with other pathogens, and should not be used as the sole basis for treatment or other patient management decisions. Negative results must be combined with clinical observations, patient history, and epidemiological information. The expected result is Negative. Fact Sheet for Patients: SugarRoll.be Fact Sheet for Healthcare Providers: https://www.woods-mathews.com/ This test is not yet approved or cleared  by the [place] FDA and  has been authorized for detection and/or diagnosis of SARS-CoV-2 by FDA under an Emergency Use Authorization (EUA). This EUA will remain  in effect (meaning this test can be used) for the duration of the COVID-19 declaration under Section 56 4(b)(1) of the Act, 21 U.S.C. section 360bbb-3(b)(1), unless the authorization is terminated or revoked sooner. Performed at [facility], [place] [address] [place], [place] [address]   SARS CORONAVIRUS 2 (TAT 6-24 HRS) Nasopharyngeal     Status: Abnormal   Collection Time: 04/03/19  1:27 PM   Specimen: Nasopharyngeal  Result Value Ref Range Status   SARS Coronavirus 2 POSITIVE (A) NEGATIVE Final    Comment: RESULT CALLED TO, READ BACK BY AND VERIFIED WITH: A.ANDREWS RN 2256 04/03/19 MCCORMICK K (NOTE) SARS-CoV-2 target nucleic acids are DETECTED. The SARS-CoV-2 RNA is generally detectable in upper and lower respiratory specimens during the acute phase of infection. Positive results are indicative of the presence of SARS-CoV-2 RNA. Clinical correlation with patient history and other diagnostic information is  necessary to determine patient infection status. Positive results do not rule out bacterial infection or co-infection with other viruses.  The expected result is Negative. Fact Sheet for Patients: SugarRoll.be Fact Sheet for Healthcare Providers: https://www.woods-mathews.com/ This test is not yet approved or cleared by the [place] FDA and  has been authorized for detection and/or diagnosis of SARS-CoV-2 by FDA under an Emergency Use Authorization (EUA). This EUA will remain  in effect (meaning this test can be used) for  the duration of the COVID-19 declaration under Section 564(b)(1) of the Act, 21 U.S.C. section 360bbb-3(b)(1), unless the authorization is terminated or revoked sooner. Performed at [facility], [place] [address], [place],   [address]      Labs: BNP (last 3 results) Recent Labs    03/30/19 2049  BNP [id number]   Basic Metabolic Panel: Recent Labs  Lab 04/01/19 0922 04/02/19 0428 04/03/19 0330 04/04/19 0700 04/05/19 0347  NA 135 134* 135 136 139  K 4.1 4.4 3.6 3.3* 3.6  CL 106 103 104 105 107  CO2 16* 20* 21* 22 23  GLUCOSE 283* 162* 141* 126* 174*  BUN 13 12 12 12 16   CREATININE 0.96 0.78 0.72 0.79 0.96  CALCIUM 8.6* 8.8* 8.4* 8.4* 8.1*   Liver Function Tests: Recent Labs  Lab 04/01/19 0922 04/02/19 0428 04/03/19 0330 04/04/19 0700 04/05/19 0347  AST 23 43* 105* 104* 53*  ALT  19 28 88* 110* 83*  ALKPHOS 82 81 69 69 62  BILITOT 0.3 0.9 0.5 0.3 0.3  PROT 5.7* 5.9* 5.7* 5.3* 5.3*  ALBUMIN 2.7* 2.8* 2.6* 2.4* 2.3*   No results for input(s): LIPASE, AMYLASE in the last 168 hours. No results for input(s): AMMONIA in the last 168 hours. CBC: Recent Labs  Lab 03/30/19 2049 04/01/19 0922 04/02/19 0428 04/03/19 0330 04/04/19 0700 04/05/19 0347  WBC 10.9* 8.6 8.4 4.8 4.2 4.9  NEUTROABS 8.0*  --   --   --   --   --   HGB 12.1 11.4* 11.3* 10.4* 11.0* 10.4*  HCT 40.5 38.1 36.0 34.2* 36.6 34.3*  MCV 86.2 84.9 81.8 84.0 83.9 83.5  PLT 325 237 283 202 245 252   Cardiac Enzymes: No results for input(s): CKTOTAL, CKMB, CKMBINDEX, TROPONINI in the last 168 hours. BNP: Invalid input(s): POCBNP CBG: Recent Labs  Lab 04/04/19 1118 04/04/19 1641 04/04/19 2030 04/05/19 0734 04/05/19 1140  GLUCAP 282* 231* 161* 148* 157*   D-Dimer Recent Labs    04/04/19 0700 04/05/19 0347  DDIMER 0.61* 0.65*   Hgb A1c No results for input(s): HGBA1C in the last 72 hours. Lipid Profile No results for input(s): CHOL, HDL, LDLCALC, TRIG, CHOLHDL, LDLDIRECT in the last 72 hours. Thyroid function studies No results for input(s): TSH, T4TOTAL, T3FREE, THYROIDAB in the last 72 hours.  Invalid input(s): FREET3 Anemia work up No results for input(s): VITAMINB12, FOLATE, FERRITIN, TIBC, IRON, RETICCTPCT  in the last 72 hours. Urinalysis    Component Value Date/Time   COLORURINE YELLOW 03/30/2019 2041   APPEARANCEUR CLOUDY (A) 03/30/2019 2041   APPEARANCEUR Clear 10/11/2017 1310   LABSPEC 1.021 03/30/2019 2041   PHURINE 5.0 03/30/2019 2041   GLUCOSEU NEGATIVE 03/30/2019 2041   HGBUR NEGATIVE 03/30/2019 2041   BILIRUBINUR NEGATIVE 03/30/2019 2041   BILIRUBINUR Negative 10/11/2017 [place] 03/30/2019 2041   PROTEINUR 100 (A) 03/30/2019 2041   UROBILINOGEN negative 03/14/2016 1227   NITRITE NEGATIVE 03/30/2019 2041   LEUKOCYTESUR LARGE (A) 03/30/2019 2041   Sepsis Labs Invalid input(s): PROCALCITONIN,  WBC,  LACTICIDVEN Microbiology Recent Results (from the past 240 hour(s))  Urine culture     Status: None   Collection Time: 03/30/19  8:41 PM   Specimen: In/Out Cath Urine  Result Value Ref Range Status   Specimen Description IN/OUT CATH URINE  Final   Special Requests NONE  Final   Culture   Final    NO GROWTH Performed at [facility], [place] [address] [place], [place] [address]    Report Status 04/01/2019 FINAL  Final  SARS CORONAVIRUS 2 (TAT 6-24 HRS) Nasopharyngeal Nasopharyngeal Swab     Status: None   Collection Time: 03/31/19  4:59 AM   Specimen: Nasopharyngeal Swab  Result Value Ref Range Status   SARS Coronavirus 2 NEGATIVE NEGATIVE Final    Comment: (NOTE) SARS-CoV-2 target nucleic acids are NOT DETECTED. The SARS-CoV-2 RNA is generally detectable in upper and lower respiratory specimens during the acute phase of infection. Negative results do not preclude SARS-CoV-2 infection, do not rule out co-infections with other pathogens, and should not be used as the sole basis for treatment or other patient management decisions. Negative results must be combined with clinical observations, patient history, and epidemiological information. The expected result is Negative. Fact Sheet for Patients: SugarRoll.be Fact  Sheet for Healthcare Providers: https://www.woods-mathews.com/ This test is not yet approved or cleared by the [place] FDA and  has been authorized for detection and/or diagnosis of SARS-CoV-2 by FDA under an Emergency Use Authorization (EUA). This EUA will remain  in effect (meaning this test can be used) for the duration of the COVID-19 declaration under Section 56 4(b)(1) of the Act, 21 U.S.C. section 360bbb-3(b)(1), unless the authorization is terminated or revoked sooner. Performed at [facility], [place] [address] [place], [place] [address]   SARS CORONAVIRUS 2 (TAT 6-24 HRS) Nasopharyngeal     Status: Abnormal   Collection Time: 04/03/19  1:27 PM   Specimen: Nasopharyngeal  Result Value Ref Range Status   SARS Coronavirus 2 POSITIVE (A) NEGATIVE Final    Comment: RESULT CALLED TO, READ BACK BY AND VERIFIED WITH: A.ANDREWS RN 2256 04/03/19 MCCORMICK K (NOTE) SARS-CoV-2 target nucleic acids are DETECTED. The SARS-CoV-2 RNA is generally detectable in upper and lower respiratory specimens during the acute phase of infection. Positive results are indicative of the presence of SARS-CoV-2 RNA. Clinical correlation with patient history and other diagnostic information is  necessary to determine patient infection status. Positive results do not rule out bacterial infection or co-infection with other viruses.  The expected result is Negative. Fact Sheet for Patients: SugarRoll.be Fact Sheet for Healthcare Providers: https://www.woods-mathews.com/ This test is not yet approved or cleared by the [place] FDA and  has been authorized for detection and/or diagnosis of SARS-CoV-2 by FDA under an Emergency Use Authorization (EUA). This EUA will remain  in effect (meaning this test can be used) for  the duration of the COVID-19 declaration under Section 564(b)(1) of the Act, 21 U.S.C. section 360bbb-3(b)(1), unless the  authorization is terminated or revoked sooner. Performed at [facility], [place] [address], [place], [place] [address]     Discharge Instructions     Discharge Instructions    Diet - low sodium heart healthy   Complete by: As directed    Discharge instructions   Complete by: As directed    You were seen and examined in the hospital for sepsis from colitis and found to be COVID 19 + as well and cared for by a hospitalist.   Upon Discharge:  -continue taking your home medications -must isolate according to CDC guidelines for at least 10 days from positive test/symptom onset and must not have a fever for 24 hours without using antipyretics and must have improved/resolved symptoms Make an appointment with your primary care physician within 7 days - Get lab work in two days to follow up on your liver function -Can take Vitamin C and Zinc x 5 days and continue home vitamin D. On multivitamin -No need for azithromycin at this time    Read the complete instructions along with all the possible side effects for all the medicines you take and that have been prescribed to you. Take any new medicines after you have completely understood and accept all the possible adverse reactions/side effects.   If you have any questions about your discharge medications or the care you received while you were in the hospital, you can call the unit and asked to speak with the hospitalist on call. Once you are discharged, your primary care physician will handle any further medical issues. Please note that NO REFILLS for any discharge medications will be authorized, as it is imperative that you return to your primary care physician (or establish a relationship with a primary care physician if you do not have one) for your aftercare needs so that they can reassess your need for  medications and monitor your lab values.   Do not drive, operate heavy machinery, perform activities at heights, swimming or  participation in water activities or provide baby sitting services if your were admitted for loss of consciousness/seizures or if you are on sedating medications including, but not limited to benzodiazepines, sleep medications, narcotic pain medications, etc., until you have been cleared to do so by a medical doctor.   Do not take more than prescribed medications.   Wear a seat belt while driving.  If you have smoked or chewed Tobacco in the last 2 years please stop smoking; also stop any regular Alcohol and/or any Recreational drug use including marijuana.  If you experience worsening of your admission symptoms or develop shortness of breath, chest pain, suicidal or homicidal thoughts or experience a life threatening emergency, you must seek medical attention immediately by calling 911 or calling your PCP immediately.   Increase activity slowly   Complete by: As directed      Allergies as of 04/05/2019      Reactions   Penicillins Rash   On MAR: Has patient had a PCN reaction causing immediate rash, facial/tongue/throat swelling, SOB or lightheadedness with hypotension: Yes Has patient had a PCN reaction causing severe rash involving mucus membranes or skin necrosis: Unk Has patient had a PCN reaction that required hospitalization: Unk Has patient had a PCN reaction occurring within the last 10 years: Unk If all of the above answers are "NO", then may proceed with Cephalosporin use.      Medication List    TAKE these medications   ascorbic acid 500 MG tablet Commonly known as: VITAMIN C Take 1 tablet (500 mg total) by mouth daily for 5 days.   aspirin 81 MG tablet Take 1 tablet (81 mg total) by mouth daily.   atorvastatin 20 MG tablet Commonly known as: LIPITOR Take 1 tablet (20 mg total) by mouth every evening.   Baby Shampoo Sham Place 1 application into both eyes See admin instructions. Use to cleanse eyes and eyelids at bedtime   calcium carbonate 750 MG chewable  tablet Commonly known as: TUMS EX Chew 1 tablet by mouth daily.   CERTA-VITE PO Take 1 tablet by mouth daily.   ferrous sulfate 325 (65 FE) MG tablet Take 325 mg by mouth daily.   fesoterodine 8 MG Tb24 tablet Commonly known as: Toviaz TAKE 1 TAB BY MOUTH EVERY DAY.  DO NOT CRUSH OR CHEW . What changed:   how much to take  how to take this  when to take this  additional instructions   GLUCERNA PO Take 1 Can by mouth 2 (two) times daily.   hydrALAZINE 25 MG tablet Commonly known as: APRESOLINE Take 25 mg by mouth 3 (three) times daily as needed (for SBP 150 or greater and DBP 85 or greter).   hydrALAZINE 10 MG tablet Commonly known as: APRESOLINE Take 1 tablet (10 mg total) by mouth 3 (three) times daily.   INTEGRA PLUS PO Take 1 capsule by mouth daily.   magnesium oxide 400 MG tablet Commonly known as: MAG-OX Take 400 mg by mouth every morning.   metFORMIN 500 MG tablet Commonly known as: GLUCOPHAGE Take 500 mg by mouth 2 (two) times daily with a meal.   omeprazole 20 MG capsule Commonly known as: PRILOSEC Take 20 mg by mouth daily.   PREVIDENT 5000 BOOSTER PLUS DT Place 1 application onto teeth every evening.   verapamil 120 MG CR tablet Commonly known as:  CALAN-SR Take 120 mg by mouth at bedtime.   Vitamin D3 50 MCG (2000 UT) Tabs Take 1 tablet by mouth daily.   zinc sulfate 220 (50 Zn) MG capsule Take 1 capsule (220 mg total) by mouth daily for 5 days.       Allergies  Allergen Reactions  . Penicillins Rash    On MAR: Has patient had a PCN reaction causing immediate rash, facial/tongue/throat swelling, SOB or lightheadedness with hypotension: Yes Has patient had a PCN reaction causing severe rash involving mucus membranes or skin necrosis: Unk Has patient had a PCN reaction that required hospitalization: Unk Has patient had a PCN reaction occurring within the last 10 years: Unk If all of the above answers are "NO", then may proceed with  Cephalosporin use.     Time coordinating discharge: Over 30 minutes   SIGNED:   Harold Hedge, D.O. [facility] Pager: [phone]  04/05/2019, 1:59 PM

## 2019-04-05 NOTE — TOC Transition Note (Signed)
Transition of Care [facility]) - CM/SW Discharge Note   Patient Details  Name: Lisa Villegas MRN: [id number] Date of Birth: [birth year]  Transition of Care [facility]) CM/SW Contact:  Benard Halsted, [place] Phone Number: 04/05/2019, 3:05 PM   Clinical Narrative:    Patient will DC to: [facility] Anticipated DC date: 04/05/19 Family notified: Delfino Lovett Transport by: Corey Harold (behind)   Per MD patient ready for DC to [facility]. RN, patient, patient's family, and facility notified of DC. Discharge Summary and FL2 sent to facility along with scripts. RN to call report prior to discharge [phone]). DC packet on chart. Ambulance transport requested for patient.   CSW will sign off for now as social work intervention is no longer needed. Please consult [place] again if new needs arise.  Cedric Fishman, LCSW Clinical Social Worker [phone]    Final next level of care: Assisted Living Barriers to Discharge: No Barriers Identified   Patient Goals and CMS Choice Patient states their goals for this hospitalization and ongoing recovery are:: Get stronger and interact more CMS Medicare.gov Compare Post Acute Care list provided to:: Legal Guardian Choice offered to / list presented to : [facility] POA / [place]  Discharge Placement              Patient chooses bed at: [facility] Patient to be transferred to facility by: [place] Name of family member notified: Delfino Lovett Patient and family notified of of transfer: 04/05/19  Discharge Plan and Services In-house Referral: Clinical Social Work   Post Acute Care Choice: [place]                               Social Determinants of Health (SDOH) Interventions     Readmission Risk Interventions Readmission Risk Prevention Plan 04/02/2019  Transportation Screening Complete  PCP or Specialist Appt within 3-5 Days Complete  HRI or [place] Complete  Social Work Consult for [place]  Planning/Counseling Complete  Palliative Care Screening Not Applicable  Medication Review Press photographer) Referral to Pharmacy  Some recent data might be hidden

## 2019-04-05 NOTE — Consult Note (Signed)
   [facility] CM Inpatient Consult   04/05/2019  Lisa Villegas [birth year] [id number]  Patient screened for high risk score for unplanned readmission score in [place] with Medicare [place] roster Review of patient's medical record reveals patient is COVID -19 positive from an Assisted Living facility [ALF].  Plan:  Patient is to return to [facility] noted per inpatient Transition of Care team notes.  No [facility] Care Management needs assessed at this time of review.  Please place a [facility] Care Management consult as appropriate and for questions contact:   Natividad Brood, RN BSN [facility] Liaison  [phone] business mobile phone Toll free office [phone]  Fax number: [phone] [name]

## 2019-04-08 DIAGNOSIS — E119 Type 2 diabetes mellitus without complications: Secondary | ICD-10-CM | POA: Diagnosis not present

## 2019-04-08 DIAGNOSIS — F840 Autistic disorder: Secondary | ICD-10-CM | POA: Diagnosis not present

## 2019-04-08 DIAGNOSIS — R2681 Unsteadiness on feet: Secondary | ICD-10-CM | POA: Diagnosis not present

## 2019-04-08 DIAGNOSIS — M6281 Muscle weakness (generalized): Secondary | ICD-10-CM | POA: Diagnosis not present

## 2019-04-08 DIAGNOSIS — F848 Other pervasive developmental disorders: Secondary | ICD-10-CM | POA: Diagnosis not present

## 2019-04-09 DIAGNOSIS — M6281 Muscle weakness (generalized): Secondary | ICD-10-CM | POA: Diagnosis not present

## 2019-04-09 DIAGNOSIS — F840 Autistic disorder: Secondary | ICD-10-CM | POA: Diagnosis not present

## 2019-04-09 DIAGNOSIS — R2681 Unsteadiness on feet: Secondary | ICD-10-CM | POA: Diagnosis not present

## 2019-04-09 DIAGNOSIS — F848 Other pervasive developmental disorders: Secondary | ICD-10-CM | POA: Diagnosis not present

## 2019-04-09 DIAGNOSIS — E119 Type 2 diabetes mellitus without complications: Secondary | ICD-10-CM | POA: Diagnosis not present

## 2019-04-11 DIAGNOSIS — M6281 Muscle weakness (generalized): Secondary | ICD-10-CM | POA: Diagnosis not present

## 2019-04-11 DIAGNOSIS — E119 Type 2 diabetes mellitus without complications: Secondary | ICD-10-CM | POA: Diagnosis not present

## 2019-04-11 DIAGNOSIS — F848 Other pervasive developmental disorders: Secondary | ICD-10-CM | POA: Diagnosis not present

## 2019-04-11 DIAGNOSIS — F840 Autistic disorder: Secondary | ICD-10-CM | POA: Diagnosis not present

## 2019-04-11 DIAGNOSIS — R2681 Unsteadiness on feet: Secondary | ICD-10-CM | POA: Diagnosis not present

## 2019-04-15 DIAGNOSIS — M6281 Muscle weakness (generalized): Secondary | ICD-10-CM | POA: Diagnosis not present

## 2019-04-15 DIAGNOSIS — R2681 Unsteadiness on feet: Secondary | ICD-10-CM | POA: Diagnosis not present

## 2019-04-15 DIAGNOSIS — F848 Other pervasive developmental disorders: Secondary | ICD-10-CM | POA: Diagnosis not present

## 2019-04-15 DIAGNOSIS — E119 Type 2 diabetes mellitus without complications: Secondary | ICD-10-CM | POA: Diagnosis not present

## 2019-04-15 DIAGNOSIS — F840 Autistic disorder: Secondary | ICD-10-CM | POA: Diagnosis not present

## 2019-04-17 DIAGNOSIS — F840 Autistic disorder: Secondary | ICD-10-CM | POA: Diagnosis not present

## 2019-04-17 DIAGNOSIS — M6281 Muscle weakness (generalized): Secondary | ICD-10-CM | POA: Diagnosis not present

## 2019-04-17 DIAGNOSIS — R2681 Unsteadiness on feet: Secondary | ICD-10-CM | POA: Diagnosis not present

## 2019-04-17 DIAGNOSIS — F848 Other pervasive developmental disorders: Secondary | ICD-10-CM | POA: Diagnosis not present

## 2019-04-17 DIAGNOSIS — E119 Type 2 diabetes mellitus without complications: Secondary | ICD-10-CM | POA: Diagnosis not present

## 2019-04-18 DIAGNOSIS — F840 Autistic disorder: Secondary | ICD-10-CM | POA: Diagnosis not present

## 2019-04-18 DIAGNOSIS — R2681 Unsteadiness on feet: Secondary | ICD-10-CM | POA: Diagnosis not present

## 2019-04-18 DIAGNOSIS — F848 Other pervasive developmental disorders: Secondary | ICD-10-CM | POA: Diagnosis not present

## 2019-04-18 DIAGNOSIS — M6281 Muscle weakness (generalized): Secondary | ICD-10-CM | POA: Diagnosis not present

## 2019-04-18 DIAGNOSIS — E119 Type 2 diabetes mellitus without complications: Secondary | ICD-10-CM | POA: Diagnosis not present

## 2019-04-19 DIAGNOSIS — E119 Type 2 diabetes mellitus without complications: Secondary | ICD-10-CM | POA: Diagnosis not present

## 2019-04-19 DIAGNOSIS — M6281 Muscle weakness (generalized): Secondary | ICD-10-CM | POA: Diagnosis not present

## 2019-04-19 DIAGNOSIS — F840 Autistic disorder: Secondary | ICD-10-CM | POA: Diagnosis not present

## 2019-04-19 DIAGNOSIS — F848 Other pervasive developmental disorders: Secondary | ICD-10-CM | POA: Diagnosis not present

## 2019-04-19 DIAGNOSIS — R2681 Unsteadiness on feet: Secondary | ICD-10-CM | POA: Diagnosis not present

## 2019-04-20 DIAGNOSIS — F840 Autistic disorder: Secondary | ICD-10-CM | POA: Diagnosis not present

## 2019-04-20 DIAGNOSIS — R2681 Unsteadiness on feet: Secondary | ICD-10-CM | POA: Diagnosis not present

## 2019-04-20 DIAGNOSIS — F848 Other pervasive developmental disorders: Secondary | ICD-10-CM | POA: Diagnosis not present

## 2019-04-20 DIAGNOSIS — M6281 Muscle weakness (generalized): Secondary | ICD-10-CM | POA: Diagnosis not present

## 2019-04-20 DIAGNOSIS — E119 Type 2 diabetes mellitus without complications: Secondary | ICD-10-CM | POA: Diagnosis not present

## 2019-04-22 DIAGNOSIS — E119 Type 2 diabetes mellitus without complications: Secondary | ICD-10-CM | POA: Diagnosis not present

## 2019-04-22 DIAGNOSIS — F848 Other pervasive developmental disorders: Secondary | ICD-10-CM | POA: Diagnosis not present

## 2019-04-22 DIAGNOSIS — R2681 Unsteadiness on feet: Secondary | ICD-10-CM | POA: Diagnosis not present

## 2019-04-22 DIAGNOSIS — M6281 Muscle weakness (generalized): Secondary | ICD-10-CM | POA: Diagnosis not present

## 2019-04-22 DIAGNOSIS — F840 Autistic disorder: Secondary | ICD-10-CM | POA: Diagnosis not present

## 2019-04-23 DIAGNOSIS — R2681 Unsteadiness on feet: Secondary | ICD-10-CM | POA: Diagnosis not present

## 2019-04-23 DIAGNOSIS — E119 Type 2 diabetes mellitus without complications: Secondary | ICD-10-CM | POA: Diagnosis not present

## 2019-04-23 DIAGNOSIS — M6281 Muscle weakness (generalized): Secondary | ICD-10-CM | POA: Diagnosis not present

## 2019-04-23 DIAGNOSIS — F840 Autistic disorder: Secondary | ICD-10-CM | POA: Diagnosis not present

## 2019-04-23 DIAGNOSIS — F848 Other pervasive developmental disorders: Secondary | ICD-10-CM | POA: Diagnosis not present

## 2019-04-24 DIAGNOSIS — F848 Other pervasive developmental disorders: Secondary | ICD-10-CM | POA: Diagnosis not present

## 2019-04-24 DIAGNOSIS — F840 Autistic disorder: Secondary | ICD-10-CM | POA: Diagnosis not present

## 2019-04-24 DIAGNOSIS — R2681 Unsteadiness on feet: Secondary | ICD-10-CM | POA: Diagnosis not present

## 2019-04-24 DIAGNOSIS — E119 Type 2 diabetes mellitus without complications: Secondary | ICD-10-CM | POA: Diagnosis not present

## 2019-04-24 DIAGNOSIS — M6281 Muscle weakness (generalized): Secondary | ICD-10-CM | POA: Diagnosis not present

## 2019-04-25 DIAGNOSIS — F848 Other pervasive developmental disorders: Secondary | ICD-10-CM | POA: Diagnosis not present

## 2019-04-25 DIAGNOSIS — M6281 Muscle weakness (generalized): Secondary | ICD-10-CM | POA: Diagnosis not present

## 2019-04-25 DIAGNOSIS — F840 Autistic disorder: Secondary | ICD-10-CM | POA: Diagnosis not present

## 2019-04-25 DIAGNOSIS — R2681 Unsteadiness on feet: Secondary | ICD-10-CM | POA: Diagnosis not present

## 2019-04-25 DIAGNOSIS — E119 Type 2 diabetes mellitus without complications: Secondary | ICD-10-CM | POA: Diagnosis not present

## 2019-04-27 DIAGNOSIS — F840 Autistic disorder: Secondary | ICD-10-CM | POA: Diagnosis not present

## 2019-04-27 DIAGNOSIS — E119 Type 2 diabetes mellitus without complications: Secondary | ICD-10-CM | POA: Diagnosis not present

## 2019-04-27 DIAGNOSIS — R2681 Unsteadiness on feet: Secondary | ICD-10-CM | POA: Diagnosis not present

## 2019-04-27 DIAGNOSIS — F848 Other pervasive developmental disorders: Secondary | ICD-10-CM | POA: Diagnosis not present

## 2019-04-27 DIAGNOSIS — M6281 Muscle weakness (generalized): Secondary | ICD-10-CM | POA: Diagnosis not present

## 2019-04-29 DIAGNOSIS — F848 Other pervasive developmental disorders: Secondary | ICD-10-CM | POA: Diagnosis not present

## 2019-04-29 DIAGNOSIS — F840 Autistic disorder: Secondary | ICD-10-CM | POA: Diagnosis not present

## 2019-04-29 DIAGNOSIS — E119 Type 2 diabetes mellitus without complications: Secondary | ICD-10-CM | POA: Diagnosis not present

## 2019-04-29 DIAGNOSIS — R2681 Unsteadiness on feet: Secondary | ICD-10-CM | POA: Diagnosis not present

## 2019-04-29 DIAGNOSIS — M6281 Muscle weakness (generalized): Secondary | ICD-10-CM | POA: Diagnosis not present

## 2019-04-30 DIAGNOSIS — E119 Type 2 diabetes mellitus without complications: Secondary | ICD-10-CM | POA: Diagnosis not present

## 2019-04-30 DIAGNOSIS — R2681 Unsteadiness on feet: Secondary | ICD-10-CM | POA: Diagnosis not present

## 2019-04-30 DIAGNOSIS — M6281 Muscle weakness (generalized): Secondary | ICD-10-CM | POA: Diagnosis not present

## 2019-04-30 DIAGNOSIS — F840 Autistic disorder: Secondary | ICD-10-CM | POA: Diagnosis not present

## 2019-04-30 DIAGNOSIS — F848 Other pervasive developmental disorders: Secondary | ICD-10-CM | POA: Diagnosis not present

## 2019-05-01 DIAGNOSIS — E119 Type 2 diabetes mellitus without complications: Secondary | ICD-10-CM | POA: Diagnosis not present

## 2019-05-01 DIAGNOSIS — M6281 Muscle weakness (generalized): Secondary | ICD-10-CM | POA: Diagnosis not present

## 2019-05-01 DIAGNOSIS — R2681 Unsteadiness on feet: Secondary | ICD-10-CM | POA: Diagnosis not present

## 2019-05-01 DIAGNOSIS — F840 Autistic disorder: Secondary | ICD-10-CM | POA: Diagnosis not present

## 2019-05-01 DIAGNOSIS — F848 Other pervasive developmental disorders: Secondary | ICD-10-CM | POA: Diagnosis not present

## 2019-05-02 DIAGNOSIS — R2681 Unsteadiness on feet: Secondary | ICD-10-CM | POA: Diagnosis not present

## 2019-05-02 DIAGNOSIS — E119 Type 2 diabetes mellitus without complications: Secondary | ICD-10-CM | POA: Diagnosis not present

## 2019-05-02 DIAGNOSIS — M6281 Muscle weakness (generalized): Secondary | ICD-10-CM | POA: Diagnosis not present

## 2019-05-02 DIAGNOSIS — F840 Autistic disorder: Secondary | ICD-10-CM | POA: Diagnosis not present

## 2019-05-02 DIAGNOSIS — F848 Other pervasive developmental disorders: Secondary | ICD-10-CM | POA: Diagnosis not present

## 2019-05-03 DIAGNOSIS — I130 Hypertensive heart and chronic kidney disease with heart failure and stage 1 through stage 4 chronic kidney disease, or unspecified chronic kidney disease: Secondary | ICD-10-CM | POA: Diagnosis not present

## 2019-05-03 DIAGNOSIS — M6281 Muscle weakness (generalized): Secondary | ICD-10-CM | POA: Diagnosis not present

## 2019-05-03 DIAGNOSIS — F848 Other pervasive developmental disorders: Secondary | ICD-10-CM | POA: Diagnosis not present

## 2019-05-03 DIAGNOSIS — E119 Type 2 diabetes mellitus without complications: Secondary | ICD-10-CM | POA: Diagnosis not present

## 2019-05-03 DIAGNOSIS — R5383 Other fatigue: Secondary | ICD-10-CM | POA: Diagnosis not present

## 2019-05-03 DIAGNOSIS — F840 Autistic disorder: Secondary | ICD-10-CM | POA: Diagnosis not present

## 2019-05-03 DIAGNOSIS — I5032 Chronic diastolic (congestive) heart failure: Secondary | ICD-10-CM | POA: Diagnosis not present

## 2019-05-03 DIAGNOSIS — E1122 Type 2 diabetes mellitus with diabetic chronic kidney disease: Secondary | ICD-10-CM | POA: Diagnosis not present

## 2019-05-03 DIAGNOSIS — R4189 Other symptoms and signs involving cognitive functions and awareness: Secondary | ICD-10-CM | POA: Diagnosis not present

## 2019-05-03 DIAGNOSIS — R2681 Unsteadiness on feet: Secondary | ICD-10-CM | POA: Diagnosis not present

## 2019-05-07 DIAGNOSIS — F848 Other pervasive developmental disorders: Secondary | ICD-10-CM | POA: Diagnosis not present

## 2019-05-07 DIAGNOSIS — R2681 Unsteadiness on feet: Secondary | ICD-10-CM | POA: Diagnosis not present

## 2019-05-07 DIAGNOSIS — E119 Type 2 diabetes mellitus without complications: Secondary | ICD-10-CM | POA: Diagnosis not present

## 2019-05-07 DIAGNOSIS — F840 Autistic disorder: Secondary | ICD-10-CM | POA: Diagnosis not present

## 2019-05-07 DIAGNOSIS — M6281 Muscle weakness (generalized): Secondary | ICD-10-CM | POA: Diagnosis not present

## 2019-05-08 DIAGNOSIS — M6281 Muscle weakness (generalized): Secondary | ICD-10-CM | POA: Diagnosis not present

## 2019-05-08 DIAGNOSIS — F848 Other pervasive developmental disorders: Secondary | ICD-10-CM | POA: Diagnosis not present

## 2019-05-08 DIAGNOSIS — R2681 Unsteadiness on feet: Secondary | ICD-10-CM | POA: Diagnosis not present

## 2019-05-08 DIAGNOSIS — F840 Autistic disorder: Secondary | ICD-10-CM | POA: Diagnosis not present

## 2019-05-08 DIAGNOSIS — E119 Type 2 diabetes mellitus without complications: Secondary | ICD-10-CM | POA: Diagnosis not present

## 2019-05-09 DIAGNOSIS — R2681 Unsteadiness on feet: Secondary | ICD-10-CM | POA: Diagnosis not present

## 2019-05-09 DIAGNOSIS — E119 Type 2 diabetes mellitus without complications: Secondary | ICD-10-CM | POA: Diagnosis not present

## 2019-05-09 DIAGNOSIS — F840 Autistic disorder: Secondary | ICD-10-CM | POA: Diagnosis not present

## 2019-05-09 DIAGNOSIS — M6281 Muscle weakness (generalized): Secondary | ICD-10-CM | POA: Diagnosis not present

## 2019-05-09 DIAGNOSIS — F848 Other pervasive developmental disorders: Secondary | ICD-10-CM | POA: Diagnosis not present

## 2019-05-10 DIAGNOSIS — M6281 Muscle weakness (generalized): Secondary | ICD-10-CM | POA: Diagnosis not present

## 2019-05-10 DIAGNOSIS — E119 Type 2 diabetes mellitus without complications: Secondary | ICD-10-CM | POA: Diagnosis not present

## 2019-05-10 DIAGNOSIS — F840 Autistic disorder: Secondary | ICD-10-CM | POA: Diagnosis not present

## 2019-05-10 DIAGNOSIS — R2681 Unsteadiness on feet: Secondary | ICD-10-CM | POA: Diagnosis not present

## 2019-05-10 DIAGNOSIS — F848 Other pervasive developmental disorders: Secondary | ICD-10-CM | POA: Diagnosis not present

## 2019-05-13 ENCOUNTER — Emergency Department (HOSPITAL_COMMUNITY): Payer: Medicare Other

## 2019-05-13 ENCOUNTER — Encounter (HOSPITAL_COMMUNITY): Payer: Self-pay

## 2019-05-13 ENCOUNTER — Other Ambulatory Visit: Payer: Self-pay

## 2019-05-13 ENCOUNTER — Emergency Department (HOSPITAL_COMMUNITY)
Admission: EM | Admit: 2019-05-13 | Discharge: 2019-05-13 | Disposition: A | Discharge status: Home / Self Care | Payer: Medicare Other | Attending: Emergency Medicine | Admitting: Emergency Medicine

## 2019-05-13 DIAGNOSIS — Z8673 Personal history of transient ischemic attack (TIA), and cerebral infarction without residual deficits: Secondary | ICD-10-CM | POA: Diagnosis not present

## 2019-05-13 DIAGNOSIS — R279 Unspecified lack of coordination: Secondary | ICD-10-CM | POA: Diagnosis not present

## 2019-05-13 DIAGNOSIS — F29 Unspecified psychosis not due to a substance or known physiological condition: Secondary | ICD-10-CM | POA: Diagnosis not present

## 2019-05-13 DIAGNOSIS — Z743 Need for continuous supervision: Secondary | ICD-10-CM | POA: Diagnosis not present

## 2019-05-13 DIAGNOSIS — I130 Hypertensive heart and chronic kidney disease with heart failure and stage 1 through stage 4 chronic kidney disease, or unspecified chronic kidney disease: Secondary | ICD-10-CM | POA: Diagnosis not present

## 2019-05-13 DIAGNOSIS — I959 Hypotension, unspecified: Secondary | ICD-10-CM | POA: Diagnosis not present

## 2019-05-13 DIAGNOSIS — R4189 Other symptoms and signs involving cognitive functions and awareness: Secondary | ICD-10-CM

## 2019-05-13 DIAGNOSIS — R4020 Unspecified coma: Secondary | ICD-10-CM | POA: Diagnosis not present

## 2019-05-13 DIAGNOSIS — Z8571 Personal history of Hodgkin lymphoma: Secondary | ICD-10-CM | POA: Insufficient documentation

## 2019-05-13 DIAGNOSIS — Z79899 Other long term (current) drug therapy: Secondary | ICD-10-CM | POA: Insufficient documentation

## 2019-05-13 DIAGNOSIS — I491 Atrial premature depolarization: Secondary | ICD-10-CM | POA: Diagnosis not present

## 2019-05-13 DIAGNOSIS — F840 Autistic disorder: Secondary | ICD-10-CM | POA: Diagnosis not present

## 2019-05-13 DIAGNOSIS — F848 Other pervasive developmental disorders: Secondary | ICD-10-CM | POA: Diagnosis not present

## 2019-05-13 DIAGNOSIS — M6281 Muscle weakness (generalized): Secondary | ICD-10-CM | POA: Diagnosis not present

## 2019-05-13 DIAGNOSIS — I5032 Chronic diastolic (congestive) heart failure: Secondary | ICD-10-CM | POA: Insufficient documentation

## 2019-05-13 DIAGNOSIS — E119 Type 2 diabetes mellitus without complications: Secondary | ICD-10-CM | POA: Diagnosis not present

## 2019-05-13 DIAGNOSIS — R55 Syncope and collapse: Secondary | ICD-10-CM | POA: Diagnosis not present

## 2019-05-13 DIAGNOSIS — N183 Chronic kidney disease, stage 3 unspecified: Secondary | ICD-10-CM | POA: Insufficient documentation

## 2019-05-13 DIAGNOSIS — Z7984 Long term (current) use of oral hypoglycemic drugs: Secondary | ICD-10-CM | POA: Diagnosis not present

## 2019-05-13 DIAGNOSIS — E1122 Type 2 diabetes mellitus with diabetic chronic kidney disease: Secondary | ICD-10-CM | POA: Insufficient documentation

## 2019-05-13 DIAGNOSIS — R5381 Other malaise: Secondary | ICD-10-CM | POA: Diagnosis not present

## 2019-05-13 DIAGNOSIS — R4182 Altered mental status, unspecified: Secondary | ICD-10-CM | POA: Diagnosis not present

## 2019-05-13 DIAGNOSIS — R2681 Unsteadiness on feet: Secondary | ICD-10-CM | POA: Diagnosis not present

## 2019-05-13 DIAGNOSIS — F0390 Unspecified dementia without behavioral disturbance: Secondary | ICD-10-CM | POA: Insufficient documentation

## 2019-05-13 DIAGNOSIS — R404 Transient alteration of awareness: Secondary | ICD-10-CM | POA: Diagnosis not present

## 2019-05-13 DIAGNOSIS — Z7982 Long term (current) use of aspirin: Secondary | ICD-10-CM | POA: Diagnosis not present

## 2019-05-13 DIAGNOSIS — R410 Disorientation, unspecified: Secondary | ICD-10-CM | POA: Diagnosis not present

## 2019-05-13 LAB — URINALYSIS, ROUTINE W REFLEX MICROSCOPIC
Bacteria, UA: NONE SEEN
Bilirubin Urine: NEGATIVE
Glucose, UA: NEGATIVE mg/dL
Hgb urine dipstick: NEGATIVE
Ketones, ur: 20 mg/dL — AB
Leukocytes,Ua: NEGATIVE
Nitrite: NEGATIVE
Protein, ur: 100 mg/dL — AB
Specific Gravity, Urine: 1.017 (ref 1.005–1.030)
pH: 8 (ref 5.0–8.0)

## 2019-05-13 LAB — CBC WITH DIFFERENTIAL/PLATELET
Abs Immature Granulocytes: 0.12 10*3/uL — ABNORMAL HIGH (ref 0.00–0.07)
Basophils Absolute: 0.1 10*3/uL (ref 0.0–0.1)
Basophils Relative: 1 %
Eosinophils Absolute: 0.2 10*3/uL (ref 0.0–0.5)
Eosinophils Relative: 2 %
HCT: 39.6 % (ref 36.0–46.0)
Hemoglobin: 11.8 g/dL — ABNORMAL LOW (ref 12.0–15.0)
Immature Granulocytes: 1 %
Lymphocytes Relative: 21 %
Lymphs Abs: 2 10*3/uL (ref 0.7–4.0)
MCH: 25.6 pg — ABNORMAL LOW (ref 26.0–34.0)
MCHC: 29.8 g/dL — ABNORMAL LOW (ref 30.0–36.0)
MCV: 85.9 fL (ref 80.0–100.0)
Monocytes Absolute: 0.5 10*3/uL (ref 0.1–1.0)
Monocytes Relative: 6 %
Neutro Abs: 6.6 10*3/uL (ref 1.7–7.7)
Neutrophils Relative %: 69 %
Platelets: 412 10*3/uL — ABNORMAL HIGH (ref 150–400)
RBC: 4.61 MIL/uL (ref 3.87–5.11)
RDW: 17.3 % — ABNORMAL HIGH (ref 11.5–15.5)
WBC: 9.4 10*3/uL (ref 4.0–10.5)
nRBC: 0 % (ref 0.0–0.2)

## 2019-05-13 LAB — BASIC METABOLIC PANEL
Anion gap: 16 — ABNORMAL HIGH (ref 5–15)
BUN: 25 mg/dL — ABNORMAL HIGH (ref 8–23)
CO2: 21 mmol/L — ABNORMAL LOW (ref 22–32)
Calcium: 9.8 mg/dL (ref 8.9–10.3)
Chloride: 102 mmol/L (ref 98–111)
Creatinine, Ser: 1.14 mg/dL — ABNORMAL HIGH (ref 0.44–1.00)
GFR calc Af Amer: 50 mL/min — ABNORMAL LOW (ref >60)
GFR calc non Af Amer: 44 mL/min — ABNORMAL LOW (ref >60)
Glucose, Bld: 191 mg/dL — ABNORMAL HIGH (ref 70–99)
Potassium: 4.6 mmol/L (ref 3.5–5.1)
Sodium: 139 mmol/L (ref 135–145)

## 2019-05-13 NOTE — ED Notes (Signed)
[phone] linda lande poa wants an update

## 2019-05-13 NOTE — ED Triage Notes (Signed)
Pt arrived via [facility] from [facility]. Facility initially called because pt was doing physical therapy and was not acting right. EMS stated pt was lethargic and drooling from the right side of her mouth when they arrived. EMS moved pt to stretcher, laid her flat and pt became oriented to self which is baseline. Pt has no complaints.

## 2019-05-13 NOTE — ED Notes (Signed)
Please call [name] (emergency contact) if pt is discharged, she will drive pt back to facility

## 2019-05-13 NOTE — Discharge Instructions (Signed)
Recommend follow-up with primary doctor.  Return to ER for any recurrent episodes of passing out, unresponsiveness.

## 2019-05-13 NOTE — ED Notes (Signed)
Gave pt a [place] sandwich and apple sauce.

## 2019-05-13 NOTE — ED Provider Notes (Signed)
Blood pressure 99/85, pulse 69, temperature 97.7 F (36.5 C), temperature source Oral, resp. rate 18, SpO2 99 %.  Assuming care from Dr. Roslynn Amble.  In short, Lisa Villegas is a 86 y.o. female with a chief complaint of Altered Mental Status .  Refer to the original H&P for additional details.  The current plan of care is to f/u on UA and reassess.   UA negative for infection. Will discharge back to SNF.    Margette Fast, MD 05/13/19 [phone]

## 2019-05-13 NOTE — ED Notes (Signed)
Pt transported to CT ?

## 2019-05-13 NOTE — ED Notes (Signed)
Pt's family member stated "pt is diabetic so I've been giving her chocolate chips and nuts and it's keeping her happy."

## 2019-05-13 NOTE — ED Notes (Signed)
Pt found sitting at the foot of her bed, large BM noted on the mattress, pt cleaned, linens changed.
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
## 2019-05-14 DIAGNOSIS — F848 Other pervasive developmental disorders: Secondary | ICD-10-CM | POA: Diagnosis not present

## 2019-05-14 DIAGNOSIS — M6281 Muscle weakness (generalized): Secondary | ICD-10-CM | POA: Diagnosis not present

## 2019-05-14 DIAGNOSIS — R2681 Unsteadiness on feet: Secondary | ICD-10-CM | POA: Diagnosis not present

## 2019-05-14 DIAGNOSIS — E119 Type 2 diabetes mellitus without complications: Secondary | ICD-10-CM | POA: Diagnosis not present

## 2019-05-14 DIAGNOSIS — F840 Autistic disorder: Secondary | ICD-10-CM | POA: Diagnosis not present

## 2019-05-15 ENCOUNTER — Ambulatory Visit: Payer: Medicare Other | Admitting: Podiatry

## 2019-05-15 DIAGNOSIS — M6281 Muscle weakness (generalized): Secondary | ICD-10-CM | POA: Diagnosis not present

## 2019-05-15 DIAGNOSIS — F848 Other pervasive developmental disorders: Secondary | ICD-10-CM | POA: Diagnosis not present

## 2019-05-15 DIAGNOSIS — E119 Type 2 diabetes mellitus without complications: Secondary | ICD-10-CM | POA: Diagnosis not present

## 2019-05-15 DIAGNOSIS — R2681 Unsteadiness on feet: Secondary | ICD-10-CM | POA: Diagnosis not present

## 2019-05-15 DIAGNOSIS — F840 Autistic disorder: Secondary | ICD-10-CM | POA: Diagnosis not present

## 2019-05-17 ENCOUNTER — Ambulatory Visit: Payer: Medicare Other | Admitting: Podiatry

## 2019-05-17 DIAGNOSIS — M6281 Muscle weakness (generalized): Secondary | ICD-10-CM | POA: Diagnosis not present

## 2019-05-17 DIAGNOSIS — F848 Other pervasive developmental disorders: Secondary | ICD-10-CM | POA: Diagnosis not present

## 2019-05-17 DIAGNOSIS — F840 Autistic disorder: Secondary | ICD-10-CM | POA: Diagnosis not present

## 2019-05-17 DIAGNOSIS — R2681 Unsteadiness on feet: Secondary | ICD-10-CM | POA: Diagnosis not present

## 2019-05-17 DIAGNOSIS — E119 Type 2 diabetes mellitus without complications: Secondary | ICD-10-CM | POA: Diagnosis not present

## 2019-05-18 DIAGNOSIS — E119 Type 2 diabetes mellitus without complications: Secondary | ICD-10-CM | POA: Diagnosis not present

## 2019-05-18 DIAGNOSIS — F840 Autistic disorder: Secondary | ICD-10-CM | POA: Diagnosis not present

## 2019-05-18 DIAGNOSIS — R2681 Unsteadiness on feet: Secondary | ICD-10-CM | POA: Diagnosis not present

## 2019-05-18 DIAGNOSIS — F848 Other pervasive developmental disorders: Secondary | ICD-10-CM | POA: Diagnosis not present

## 2019-05-18 DIAGNOSIS — M6281 Muscle weakness (generalized): Secondary | ICD-10-CM | POA: Diagnosis not present

## 2019-05-20 DIAGNOSIS — R2681 Unsteadiness on feet: Secondary | ICD-10-CM | POA: Diagnosis not present

## 2019-05-20 DIAGNOSIS — F848 Other pervasive developmental disorders: Secondary | ICD-10-CM | POA: Diagnosis not present

## 2019-05-20 DIAGNOSIS — M6281 Muscle weakness (generalized): Secondary | ICD-10-CM | POA: Diagnosis not present

## 2019-05-20 DIAGNOSIS — F840 Autistic disorder: Secondary | ICD-10-CM | POA: Diagnosis not present

## 2019-05-20 DIAGNOSIS — E119 Type 2 diabetes mellitus without complications: Secondary | ICD-10-CM | POA: Diagnosis not present

## 2019-05-21 DIAGNOSIS — M6281 Muscle weakness (generalized): Secondary | ICD-10-CM | POA: Diagnosis not present

## 2019-05-21 DIAGNOSIS — R2681 Unsteadiness on feet: Secondary | ICD-10-CM | POA: Diagnosis not present

## 2019-05-21 DIAGNOSIS — F848 Other pervasive developmental disorders: Secondary | ICD-10-CM | POA: Diagnosis not present

## 2019-05-21 DIAGNOSIS — E119 Type 2 diabetes mellitus without complications: Secondary | ICD-10-CM | POA: Diagnosis not present

## 2019-05-21 DIAGNOSIS — F840 Autistic disorder: Secondary | ICD-10-CM | POA: Diagnosis not present

## 2019-05-22 DIAGNOSIS — M6281 Muscle weakness (generalized): Secondary | ICD-10-CM | POA: Diagnosis not present

## 2019-05-22 DIAGNOSIS — F848 Other pervasive developmental disorders: Secondary | ICD-10-CM | POA: Diagnosis not present

## 2019-05-22 DIAGNOSIS — F840 Autistic disorder: Secondary | ICD-10-CM | POA: Diagnosis not present

## 2019-05-22 DIAGNOSIS — R2681 Unsteadiness on feet: Secondary | ICD-10-CM | POA: Diagnosis not present

## 2019-05-22 DIAGNOSIS — E119 Type 2 diabetes mellitus without complications: Secondary | ICD-10-CM | POA: Diagnosis not present

## 2019-05-24 DIAGNOSIS — F848 Other pervasive developmental disorders: Secondary | ICD-10-CM | POA: Diagnosis not present

## 2019-05-24 DIAGNOSIS — F840 Autistic disorder: Secondary | ICD-10-CM | POA: Diagnosis not present

## 2019-05-24 DIAGNOSIS — M6281 Muscle weakness (generalized): Secondary | ICD-10-CM | POA: Diagnosis not present

## 2019-05-24 DIAGNOSIS — R2681 Unsteadiness on feet: Secondary | ICD-10-CM | POA: Diagnosis not present

## 2019-05-24 DIAGNOSIS — E119 Type 2 diabetes mellitus without complications: Secondary | ICD-10-CM | POA: Diagnosis not present

## 2019-05-27 DIAGNOSIS — E119 Type 2 diabetes mellitus without complications: Secondary | ICD-10-CM | POA: Diagnosis not present

## 2019-05-27 DIAGNOSIS — M6281 Muscle weakness (generalized): Secondary | ICD-10-CM | POA: Diagnosis not present

## 2019-05-27 DIAGNOSIS — F840 Autistic disorder: Secondary | ICD-10-CM | POA: Diagnosis not present

## 2019-05-27 DIAGNOSIS — F848 Other pervasive developmental disorders: Secondary | ICD-10-CM | POA: Diagnosis not present

## 2019-05-27 DIAGNOSIS — R2681 Unsteadiness on feet: Secondary | ICD-10-CM | POA: Diagnosis not present

## 2019-05-28 DIAGNOSIS — F848 Other pervasive developmental disorders: Secondary | ICD-10-CM | POA: Diagnosis not present

## 2019-05-28 DIAGNOSIS — M6281 Muscle weakness (generalized): Secondary | ICD-10-CM | POA: Diagnosis not present

## 2019-05-28 DIAGNOSIS — R2681 Unsteadiness on feet: Secondary | ICD-10-CM | POA: Diagnosis not present

## 2019-05-28 DIAGNOSIS — E119 Type 2 diabetes mellitus without complications: Secondary | ICD-10-CM | POA: Diagnosis not present

## 2019-05-28 DIAGNOSIS — F840 Autistic disorder: Secondary | ICD-10-CM | POA: Diagnosis not present

## 2019-05-29 DIAGNOSIS — F840 Autistic disorder: Secondary | ICD-10-CM | POA: Diagnosis not present

## 2019-05-29 DIAGNOSIS — M6281 Muscle weakness (generalized): Secondary | ICD-10-CM | POA: Diagnosis not present

## 2019-05-29 DIAGNOSIS — E119 Type 2 diabetes mellitus without complications: Secondary | ICD-10-CM | POA: Diagnosis not present

## 2019-05-29 DIAGNOSIS — F848 Other pervasive developmental disorders: Secondary | ICD-10-CM | POA: Diagnosis not present

## 2019-05-29 DIAGNOSIS — R2681 Unsteadiness on feet: Secondary | ICD-10-CM | POA: Diagnosis not present

## 2019-05-31 DIAGNOSIS — R2681 Unsteadiness on feet: Secondary | ICD-10-CM | POA: Diagnosis not present

## 2019-05-31 DIAGNOSIS — E119 Type 2 diabetes mellitus without complications: Secondary | ICD-10-CM | POA: Diagnosis not present

## 2019-05-31 DIAGNOSIS — F848 Other pervasive developmental disorders: Secondary | ICD-10-CM | POA: Diagnosis not present

## 2019-05-31 DIAGNOSIS — M6281 Muscle weakness (generalized): Secondary | ICD-10-CM | POA: Diagnosis not present

## 2019-05-31 DIAGNOSIS — F840 Autistic disorder: Secondary | ICD-10-CM | POA: Diagnosis not present

## 2019-06-02 DIAGNOSIS — Z23 Encounter for immunization: Secondary | ICD-10-CM | POA: Diagnosis not present

## 2019-06-03 DIAGNOSIS — F840 Autistic disorder: Secondary | ICD-10-CM | POA: Diagnosis not present

## 2019-06-03 DIAGNOSIS — M6281 Muscle weakness (generalized): Secondary | ICD-10-CM | POA: Diagnosis not present

## 2019-06-03 DIAGNOSIS — E119 Type 2 diabetes mellitus without complications: Secondary | ICD-10-CM | POA: Diagnosis not present

## 2019-06-03 DIAGNOSIS — F848 Other pervasive developmental disorders: Secondary | ICD-10-CM | POA: Diagnosis not present

## 2019-06-03 DIAGNOSIS — R2681 Unsteadiness on feet: Secondary | ICD-10-CM | POA: Diagnosis not present

## 2019-06-04 DIAGNOSIS — R2681 Unsteadiness on feet: Secondary | ICD-10-CM | POA: Diagnosis not present

## 2019-06-04 DIAGNOSIS — F840 Autistic disorder: Secondary | ICD-10-CM | POA: Diagnosis not present

## 2019-06-04 DIAGNOSIS — M6281 Muscle weakness (generalized): Secondary | ICD-10-CM | POA: Diagnosis not present

## 2019-06-04 DIAGNOSIS — E119 Type 2 diabetes mellitus without complications: Secondary | ICD-10-CM | POA: Diagnosis not present

## 2019-06-04 DIAGNOSIS — F848 Other pervasive developmental disorders: Secondary | ICD-10-CM | POA: Diagnosis not present

## 2019-06-05 DIAGNOSIS — R2681 Unsteadiness on feet: Secondary | ICD-10-CM | POA: Diagnosis not present

## 2019-06-05 DIAGNOSIS — F840 Autistic disorder: Secondary | ICD-10-CM | POA: Diagnosis not present

## 2019-06-05 DIAGNOSIS — F848 Other pervasive developmental disorders: Secondary | ICD-10-CM | POA: Diagnosis not present

## 2019-06-05 DIAGNOSIS — M6281 Muscle weakness (generalized): Secondary | ICD-10-CM | POA: Diagnosis not present

## 2019-06-05 DIAGNOSIS — E119 Type 2 diabetes mellitus without complications: Secondary | ICD-10-CM | POA: Diagnosis not present

## 2019-06-06 DIAGNOSIS — R2681 Unsteadiness on feet: Secondary | ICD-10-CM | POA: Diagnosis not present

## 2019-06-06 DIAGNOSIS — F840 Autistic disorder: Secondary | ICD-10-CM | POA: Diagnosis not present

## 2019-06-06 DIAGNOSIS — M6281 Muscle weakness (generalized): Secondary | ICD-10-CM | POA: Diagnosis not present

## 2019-06-06 DIAGNOSIS — E119 Type 2 diabetes mellitus without complications: Secondary | ICD-10-CM | POA: Diagnosis not present

## 2019-06-06 DIAGNOSIS — F848 Other pervasive developmental disorders: Secondary | ICD-10-CM | POA: Diagnosis not present

## 2019-06-07 DIAGNOSIS — E119 Type 2 diabetes mellitus without complications: Secondary | ICD-10-CM | POA: Diagnosis not present

## 2019-06-07 DIAGNOSIS — F848 Other pervasive developmental disorders: Secondary | ICD-10-CM | POA: Diagnosis not present

## 2019-06-07 DIAGNOSIS — R2681 Unsteadiness on feet: Secondary | ICD-10-CM | POA: Diagnosis not present

## 2019-06-07 DIAGNOSIS — M6281 Muscle weakness (generalized): Secondary | ICD-10-CM | POA: Diagnosis not present

## 2019-06-07 DIAGNOSIS — F840 Autistic disorder: Secondary | ICD-10-CM | POA: Diagnosis not present

## 2019-06-10 DIAGNOSIS — E119 Type 2 diabetes mellitus without complications: Secondary | ICD-10-CM | POA: Diagnosis not present

## 2019-06-10 DIAGNOSIS — M6281 Muscle weakness (generalized): Secondary | ICD-10-CM | POA: Diagnosis not present

## 2019-06-10 DIAGNOSIS — F848 Other pervasive developmental disorders: Secondary | ICD-10-CM | POA: Diagnosis not present

## 2019-06-10 DIAGNOSIS — R2681 Unsteadiness on feet: Secondary | ICD-10-CM | POA: Diagnosis not present

## 2019-06-10 DIAGNOSIS — F840 Autistic disorder: Secondary | ICD-10-CM | POA: Diagnosis not present

## 2019-06-11 ENCOUNTER — Other Ambulatory Visit: Payer: Self-pay

## 2019-06-11 ENCOUNTER — Non-Acute Institutional Stay: Payer: Medicare Other | Admitting: Hospice

## 2019-06-11 DIAGNOSIS — F848 Other pervasive developmental disorders: Secondary | ICD-10-CM | POA: Diagnosis not present

## 2019-06-11 DIAGNOSIS — Z515 Encounter for palliative care: Secondary | ICD-10-CM | POA: Diagnosis not present

## 2019-06-11 DIAGNOSIS — E119 Type 2 diabetes mellitus without complications: Secondary | ICD-10-CM | POA: Diagnosis not present

## 2019-06-11 DIAGNOSIS — F0390 Unspecified dementia without behavioral disturbance: Secondary | ICD-10-CM

## 2019-06-11 DIAGNOSIS — F840 Autistic disorder: Secondary | ICD-10-CM | POA: Diagnosis not present

## 2019-06-11 DIAGNOSIS — M6281 Muscle weakness (generalized): Secondary | ICD-10-CM | POA: Diagnosis not present

## 2019-06-11 DIAGNOSIS — R2681 Unsteadiness on feet: Secondary | ICD-10-CM | POA: Diagnosis not present

## 2019-06-11 NOTE — Progress Notes (Addendum)
[facility] Pall[facility] Care Consult Note Telephone: [phone]  Fax: [phone]  PATIENT NAME: Lisa Villegas DOB: [birth year] MRN: [id number]  PRIMARY CARE PROVIDER:   [name], MD  REFERRING PROVIDER:  [name], [place] [address] [place],  [place] [address]  [place] [phone]     RECOMMENDATIONS/PLAN: 1. Advance Care Planning/Goals of Care: Goals include to maximize quality of life and symptom management. Patient remains a DNR;Signed DNR is in the chart and uploaded in [place]. 2. Symptom Management: Memory loss related to Dementia is ongoing and at baseline. FAST 6c.Patient ambulates with her rolling walker. PT/OT is ongoing. Last month patient seen at ED for unresponsive episode; she recovered shortly and back to her baseline. UTI was ruled out. No report of  unresponsive episode since then She requires assistance with ADLs.Appetite is fair. She denied pain/discomfort. Nursing with no concerns. Encouraged ongoing  supportive nursing care. 3. Follow up Palliative Care Visit: Palliative care will continue to follow for goals of care clarification and symptom management.  I spent25 minutes providing this consultation. More than 50% of the time in this consultation was spent on coordinating  Communication.  HISTORY OF PRESENT ILLNESS:Lisa Villegas a 86 y.o.year oldfemalewith multiple medical problems including s/ CVA, dementia, gait disturbance, HTN, HLD. Palliative Care was asked to help address goals of care.  CODE STATUS: DNR  PPS: 50% HOSPICE ELIGIBILITY/DIAGNOSIS: TBD  PAST MEDICAL HISTORY:  Past Medical History:  Diagnosis Date  . Anemia   . Anxiety and depression    per med rec  . 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 Autism   . Chronic CHF (congestive heart failure) (HCC)    per med rec  . Chronic kidney disease (CKD)    stage 3 per chart in 09/2015  .  Diabetes mellitus without complication ([place])   . Diabetic neuropathy ([place])   . History of thyroid nodule    nontoxic goiter per med rec  . Hodgkin disease ([place])    in 27  . HTN (hypertension), benign   . Hyperlipidemia   . Incontinence   . Lack of sensation   . Osteopenia    per med rec  . Poor balance   . Stroke ([place])   . Tubular adenoma of colon 01/2017  . Vitamin D deficiency     SOCIAL HX:  Social History   Tobacco Use  . Smoking status: Never Smoker  . Smokeless tobacco: Never Used  Substance Use Topics  . Alcohol use: No    ALLERGIES:  Allergies  Allergen Reactions  . Penicillins Rash    On MAR: Has patient had a PCN reaction causing immediate rash, facial/tongue/throat swelling, SOB or lightheadedness with hypotension: Yes Has patient had a PCN reaction causing severe rash involving mucus membranes or skin necrosis: Unk Has patient had a PCN reaction that required hospitalization: Unk Has patient had a PCN reaction occurring within the last 10 years: Unk If all of the above answers are "NO", then may proceed with Cephalosporin use.      PERTINENT MEDICATIONS:  Outpatient Encounter Medications as of 06/11/2019  Medication Sig  . aspirin 81 MG tablet Take 1 tablet (81 mg total) by mouth daily.  Marland Kitchen atorvastatin (LIPITOR) 20 MG tablet Take 1 tablet (20 mg total) by mouth every evening.  . calcium carbonate (TUMS EX) 750 MG chewable tablet Chew 1 tablet by mouth daily.  . Cholecalciferol (VITAMIN D3) 2000 UNITS TABS Take 1 tablet by mouth  daily.   . FeFum-FePoly-FA-B Cmp-C-Biot (INTEGRA PLUS PO) Take 1 capsule by mouth daily.  . ferrous sulfate 325 (65 FE) MG tablet Take 325 mg by mouth daily.   . fesoterodine (TOVIAZ) 8 MG TB24 tablet TAKE 1 TAB BY MOUTH EVERY DAY.  DO NOT CRUSH OR CHEW . (Patient taking differently: Take 8 mg by mouth daily. )  . hydrALAZINE (APRESOLINE) 10 MG tablet Take 1 tablet (10 mg total) by mouth 3 (three) times daily.  . hydrALAZINE  (APRESOLINE) 25 MG tablet Take 25 mg by mouth 3 (three) times daily as needed (for SBP 150 or greater and DBP 85 or greter).  . Infant Care Products (BABY SHAMPOO) SHAM Place 1 application into both eyes See admin instructions. Use to cleanse eyes and eyelids at bedtime   . magnesium oxide (MAG-OX) 400 MG tablet Take 400 mg by mouth every morning.   . metFORMIN (GLUCOPHAGE) 500 MG tablet Take 500 mg by mouth 2 (two) times daily with a meal.  . Multiple Vitamins-Minerals (CERTA-VITE PO) Take 1 tablet by mouth daily.   . Nutritional Supplements (GLUCERNA PO) Take 1 Can by mouth 2 (two) times daily.   Marland Kitchen omeprazole (PRILOSEC) 20 MG capsule Take 20 mg by mouth daily.   . Sodium Fluoride (PREVIDENT 5000 BOOSTER PLUS DT) Place 1 application onto teeth every evening.   . verapamil (CALAN-SR) 120 MG CR tablet Take 120 mg by mouth at bedtime.   No facility-administered encounter medications on file as of 06/11/2019.    PHYSICAL EXAM/ROS:   General: NAD,cooperative Cardiovascular: regular rate and rhythm; denies chest pain/discomfort Pulmonary: clear ant fields; no coughing, no SOB, normal respiratory effort Extremities: non pitting mild edema, no joint deformities Skin: no rashes to exposed skin Neurological: Weakness but otherwise nonfocal  Teodoro Spray, NP

## 2019-06-12 DIAGNOSIS — M6281 Muscle weakness (generalized): Secondary | ICD-10-CM | POA: Diagnosis not present

## 2019-06-12 DIAGNOSIS — F840 Autistic disorder: Secondary | ICD-10-CM | POA: Diagnosis not present

## 2019-06-12 DIAGNOSIS — F848 Other pervasive developmental disorders: Secondary | ICD-10-CM | POA: Diagnosis not present

## 2019-06-12 DIAGNOSIS — R413 Other amnesia: Secondary | ICD-10-CM | POA: Diagnosis not present

## 2019-06-12 DIAGNOSIS — E119 Type 2 diabetes mellitus without complications: Secondary | ICD-10-CM | POA: Diagnosis not present

## 2019-06-12 DIAGNOSIS — R2681 Unsteadiness on feet: Secondary | ICD-10-CM | POA: Diagnosis not present

## 2019-06-13 DIAGNOSIS — E119 Type 2 diabetes mellitus without complications: Secondary | ICD-10-CM | POA: Diagnosis not present

## 2019-06-13 DIAGNOSIS — F848 Other pervasive developmental disorders: Secondary | ICD-10-CM | POA: Diagnosis not present

## 2019-06-13 DIAGNOSIS — F840 Autistic disorder: Secondary | ICD-10-CM | POA: Diagnosis not present

## 2019-06-13 DIAGNOSIS — R2681 Unsteadiness on feet: Secondary | ICD-10-CM | POA: Diagnosis not present

## 2019-06-13 DIAGNOSIS — M6281 Muscle weakness (generalized): Secondary | ICD-10-CM | POA: Diagnosis not present

## 2019-06-14 DIAGNOSIS — F840 Autistic disorder: Secondary | ICD-10-CM | POA: Diagnosis not present

## 2019-06-14 DIAGNOSIS — M6281 Muscle weakness (generalized): Secondary | ICD-10-CM | POA: Diagnosis not present

## 2019-06-14 DIAGNOSIS — F848 Other pervasive developmental disorders: Secondary | ICD-10-CM | POA: Diagnosis not present

## 2019-06-14 DIAGNOSIS — R2681 Unsteadiness on feet: Secondary | ICD-10-CM | POA: Diagnosis not present

## 2019-06-14 DIAGNOSIS — E119 Type 2 diabetes mellitus without complications: Secondary | ICD-10-CM | POA: Diagnosis not present

## 2019-06-15 IMAGING — CT CT HEAD W/O CM
4 series · 17 of 47 positions shown, 19 images · non-contrast
Comparison: CT scan of February 05, 2016.

CLINICAL DATA: Unable wall.

EXAM:
CT HEAD WITHOUT CONTRAST
TECHNIQUE: Contiguous axial images were obtained from the base of the skull
through the vertex without intravenous contrast.

[Series 3: head wo · axial · 0.44mm/px · z∈[-586,-460]mm · 7 of 35 slices shown, 9 images]
[im 5/35  brain]
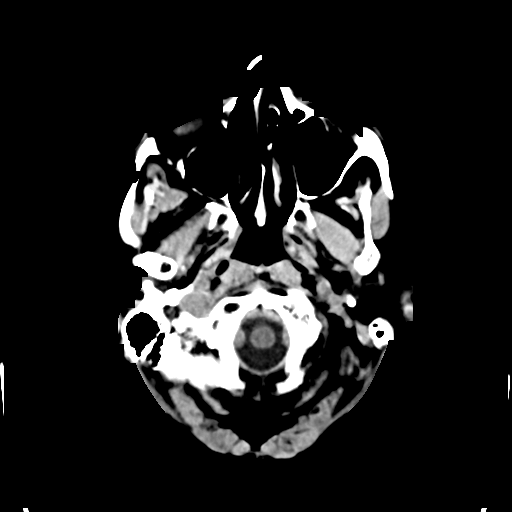
[im 5/35  bone]
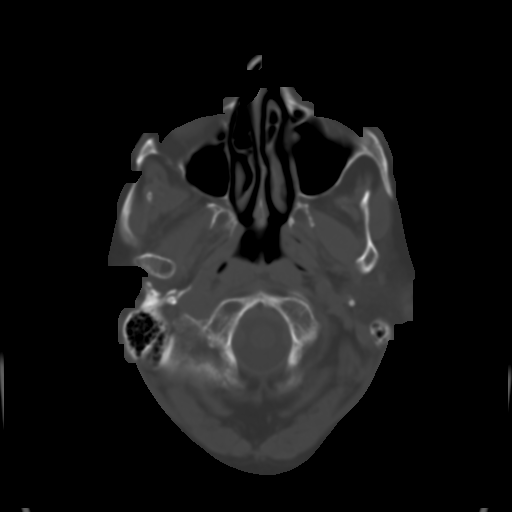
[im 9/35  brain]
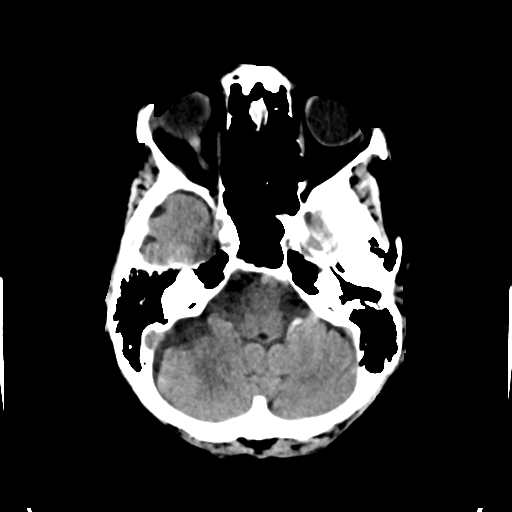
[im 13/35  brain]
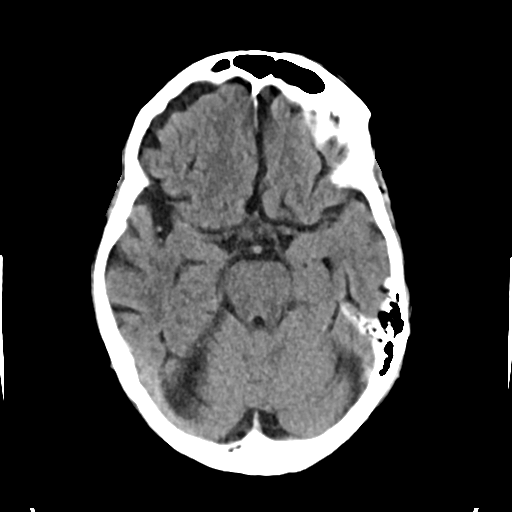
[im 18/35  brain]
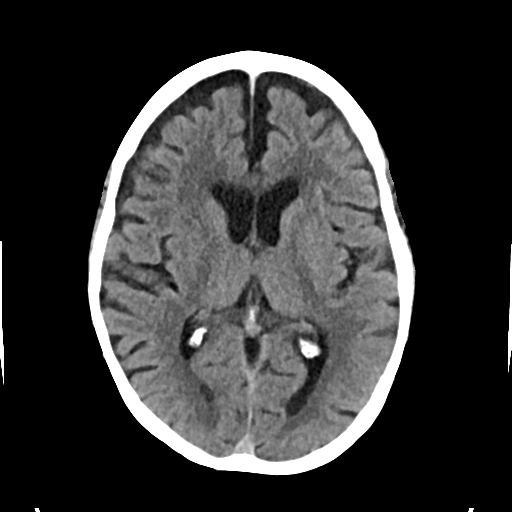
[im 22/35  brain]
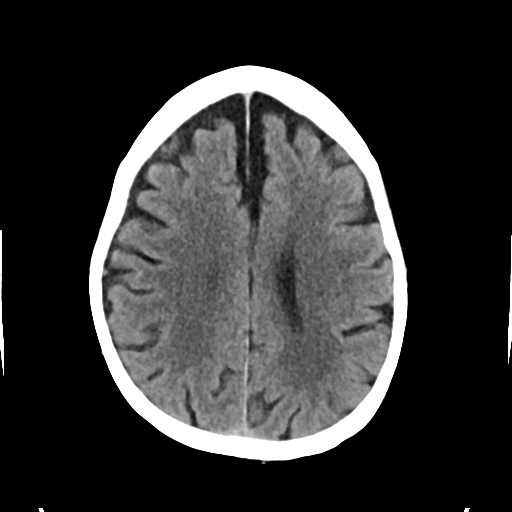
[im 22/35  bone]
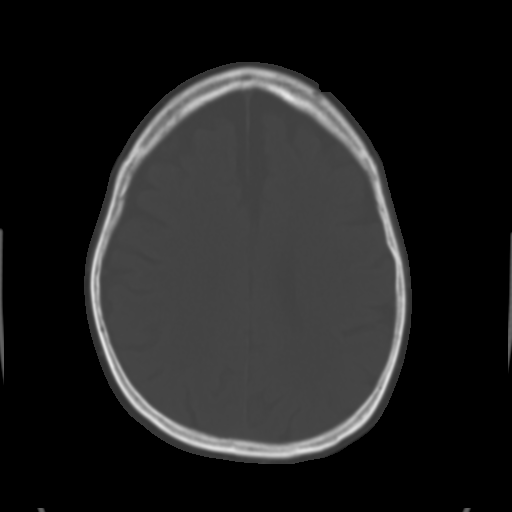
[im 26/35  brain]
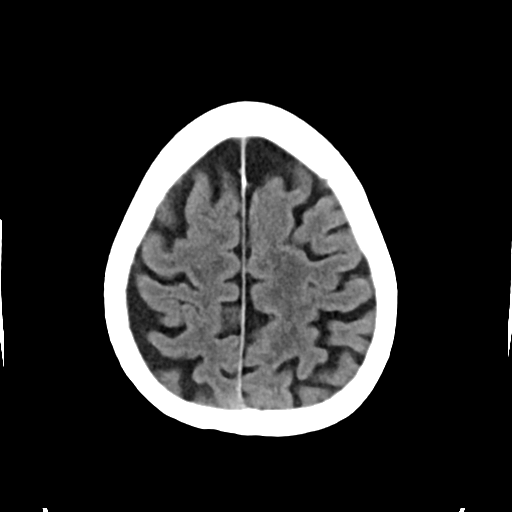
[im 30/35  brain]
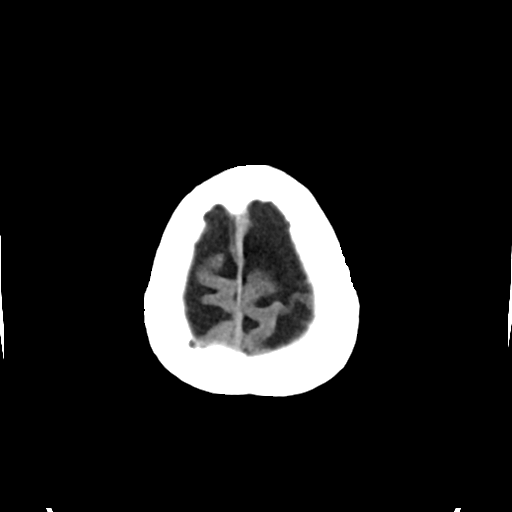

[Series 4: head bone · axial · 0.44mm/px · z∈[-590,-530]mm · 4 of 87 slices shown]
[im 9/87  bone]
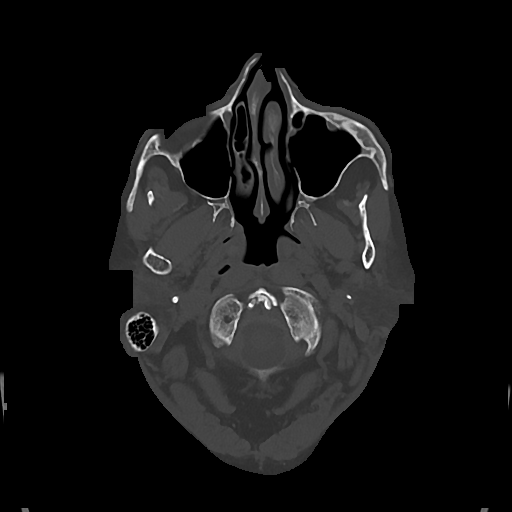
[im 18/87  bone]
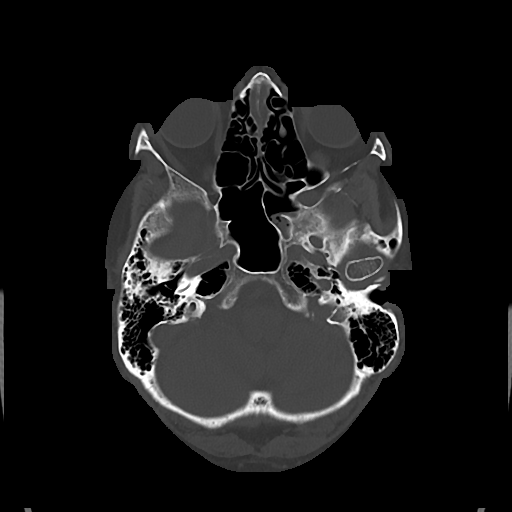
[im 26/87  bone]
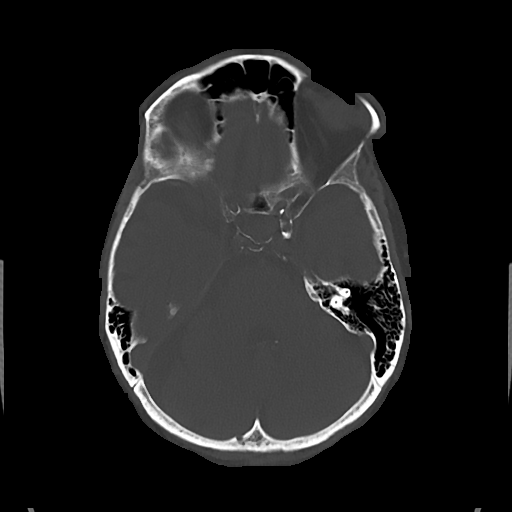
[im 39/87  bone]
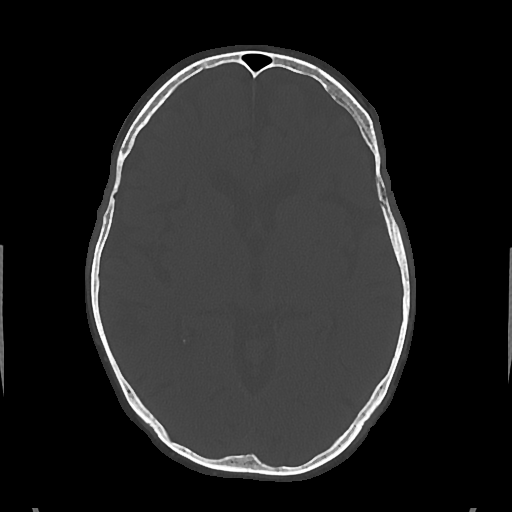

[Series 5: cor soft · coronal · 0.33mm/px · 3 of 67 slices shown]
[im 23/67  brain]
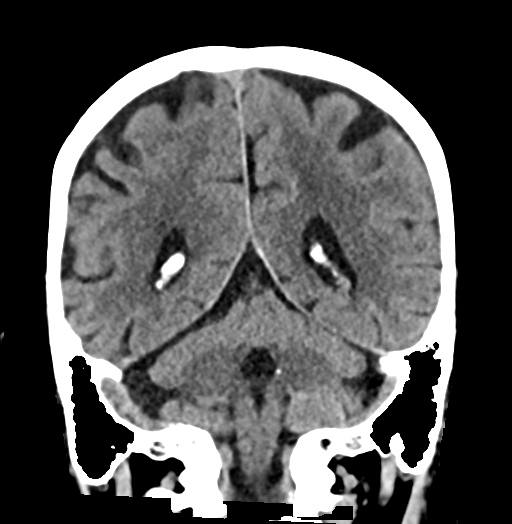
[im 30/67  brain]
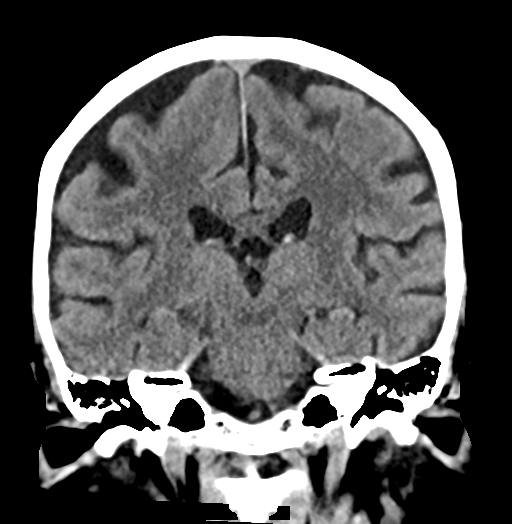
[im 37/67  brain]
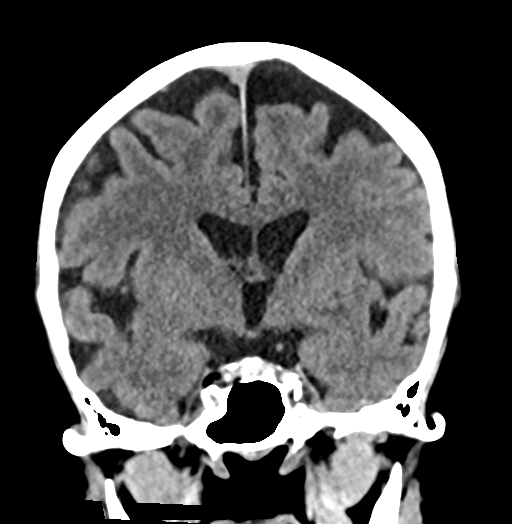

[Series 6: sag soft · sagittal · 0.34mm/px · 3 of 59 slices shown]
[im 20/59  brain]
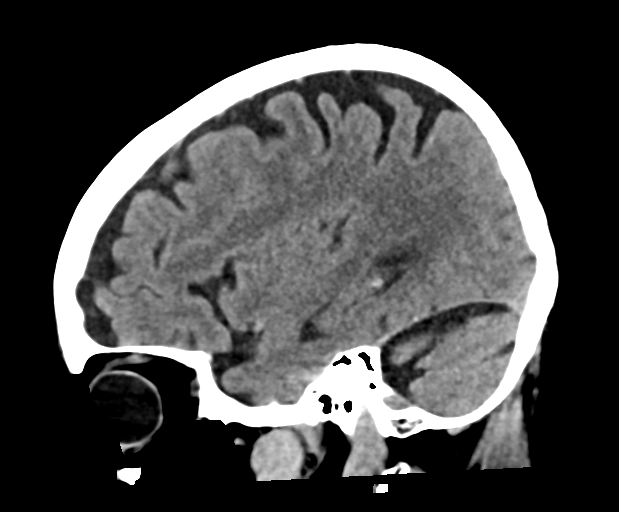
[im 30/59  brain]
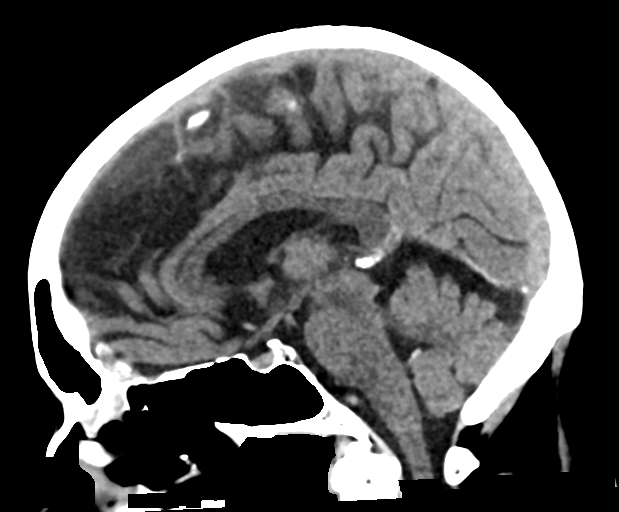
[im 39/59  brain]
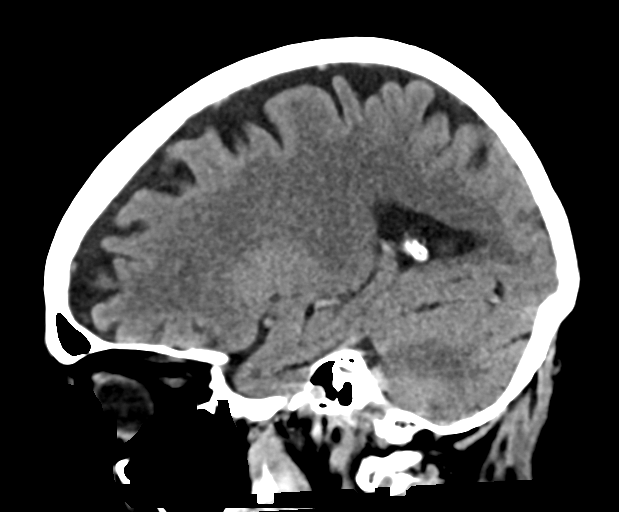

[17 of 47 positions shown; findings below may reference images not displayed]

FINDINGS: Brain: Mild diffuse cortical atrophy is noted. Mild chronic ischemic
white matter disease is noted. No mass effect or midline shift is
noted. Ventricular size is within normal limits. There is no
evidence of mass lesion, hemorrhage or acute infarction.

Vascular: No hyperdense vessel or unexpected calcification.

Skull: Normal. Negative for fracture or focal lesion.

Sinuses/Orbits: Left sphenoid sinusitis.

Other: None.
IMPRESSION: Mild diffuse cortical atrophy. Mild chronic ischemic white matter
disease. No acute intracranial abnormality seen.

## 2019-06-17 DIAGNOSIS — F840 Autistic disorder: Secondary | ICD-10-CM | POA: Diagnosis not present

## 2019-06-17 DIAGNOSIS — F848 Other pervasive developmental disorders: Secondary | ICD-10-CM | POA: Diagnosis not present

## 2019-06-17 DIAGNOSIS — R2681 Unsteadiness on feet: Secondary | ICD-10-CM | POA: Diagnosis not present

## 2019-06-17 DIAGNOSIS — M6281 Muscle weakness (generalized): Secondary | ICD-10-CM | POA: Diagnosis not present

## 2019-06-17 DIAGNOSIS — E119 Type 2 diabetes mellitus without complications: Secondary | ICD-10-CM | POA: Diagnosis not present

## 2019-06-18 DIAGNOSIS — M6281 Muscle weakness (generalized): Secondary | ICD-10-CM | POA: Diagnosis not present

## 2019-06-18 DIAGNOSIS — E119 Type 2 diabetes mellitus without complications: Secondary | ICD-10-CM | POA: Diagnosis not present

## 2019-06-18 DIAGNOSIS — F840 Autistic disorder: Secondary | ICD-10-CM | POA: Diagnosis not present

## 2019-06-18 DIAGNOSIS — F848 Other pervasive developmental disorders: Secondary | ICD-10-CM | POA: Diagnosis not present

## 2019-06-18 DIAGNOSIS — R2681 Unsteadiness on feet: Secondary | ICD-10-CM | POA: Diagnosis not present

## 2019-06-19 DIAGNOSIS — M6281 Muscle weakness (generalized): Secondary | ICD-10-CM | POA: Diagnosis not present

## 2019-06-19 DIAGNOSIS — R2681 Unsteadiness on feet: Secondary | ICD-10-CM | POA: Diagnosis not present

## 2019-06-19 DIAGNOSIS — F848 Other pervasive developmental disorders: Secondary | ICD-10-CM | POA: Diagnosis not present

## 2019-06-19 DIAGNOSIS — E119 Type 2 diabetes mellitus without complications: Secondary | ICD-10-CM | POA: Diagnosis not present

## 2019-06-19 DIAGNOSIS — F840 Autistic disorder: Secondary | ICD-10-CM | POA: Diagnosis not present

## 2019-06-21 DIAGNOSIS — F840 Autistic disorder: Secondary | ICD-10-CM | POA: Diagnosis not present

## 2019-06-21 DIAGNOSIS — F848 Other pervasive developmental disorders: Secondary | ICD-10-CM | POA: Diagnosis not present

## 2019-06-21 DIAGNOSIS — M6281 Muscle weakness (generalized): Secondary | ICD-10-CM | POA: Diagnosis not present

## 2019-06-21 DIAGNOSIS — E119 Type 2 diabetes mellitus without complications: Secondary | ICD-10-CM | POA: Diagnosis not present

## 2019-06-21 DIAGNOSIS — R2681 Unsteadiness on feet: Secondary | ICD-10-CM | POA: Diagnosis not present

## 2019-06-24 DIAGNOSIS — R2681 Unsteadiness on feet: Secondary | ICD-10-CM | POA: Diagnosis not present

## 2019-06-24 DIAGNOSIS — F840 Autistic disorder: Secondary | ICD-10-CM | POA: Diagnosis not present

## 2019-06-24 DIAGNOSIS — M6281 Muscle weakness (generalized): Secondary | ICD-10-CM | POA: Diagnosis not present

## 2019-06-24 DIAGNOSIS — F848 Other pervasive developmental disorders: Secondary | ICD-10-CM | POA: Diagnosis not present

## 2019-06-24 DIAGNOSIS — E119 Type 2 diabetes mellitus without complications: Secondary | ICD-10-CM | POA: Diagnosis not present

## 2019-06-25 DIAGNOSIS — Z20828 Contact with and (suspected) exposure to other viral communicable diseases: Secondary | ICD-10-CM | POA: Diagnosis not present

## 2019-06-26 DIAGNOSIS — E119 Type 2 diabetes mellitus without complications: Secondary | ICD-10-CM | POA: Diagnosis not present

## 2019-06-26 DIAGNOSIS — M6281 Muscle weakness (generalized): Secondary | ICD-10-CM | POA: Diagnosis not present

## 2019-06-26 DIAGNOSIS — R2681 Unsteadiness on feet: Secondary | ICD-10-CM | POA: Diagnosis not present

## 2019-06-26 DIAGNOSIS — F840 Autistic disorder: Secondary | ICD-10-CM | POA: Diagnosis not present

## 2019-06-26 DIAGNOSIS — F848 Other pervasive developmental disorders: Secondary | ICD-10-CM | POA: Diagnosis not present

## 2019-06-27 DIAGNOSIS — F848 Other pervasive developmental disorders: Secondary | ICD-10-CM | POA: Diagnosis not present

## 2019-06-27 DIAGNOSIS — F840 Autistic disorder: Secondary | ICD-10-CM | POA: Diagnosis not present

## 2019-06-27 DIAGNOSIS — E119 Type 2 diabetes mellitus without complications: Secondary | ICD-10-CM | POA: Diagnosis not present

## 2019-06-27 DIAGNOSIS — R2681 Unsteadiness on feet: Secondary | ICD-10-CM | POA: Diagnosis not present

## 2019-06-27 DIAGNOSIS — M6281 Muscle weakness (generalized): Secondary | ICD-10-CM | POA: Diagnosis not present

## 2019-06-28 DIAGNOSIS — M6281 Muscle weakness (generalized): Secondary | ICD-10-CM | POA: Diagnosis not present

## 2019-06-28 DIAGNOSIS — E119 Type 2 diabetes mellitus without complications: Secondary | ICD-10-CM | POA: Diagnosis not present

## 2019-06-28 DIAGNOSIS — R2681 Unsteadiness on feet: Secondary | ICD-10-CM | POA: Diagnosis not present

## 2019-06-28 DIAGNOSIS — F840 Autistic disorder: Secondary | ICD-10-CM | POA: Diagnosis not present

## 2019-06-28 DIAGNOSIS — F848 Other pervasive developmental disorders: Secondary | ICD-10-CM | POA: Diagnosis not present

## 2019-07-01 DIAGNOSIS — F840 Autistic disorder: Secondary | ICD-10-CM | POA: Diagnosis not present

## 2019-07-01 DIAGNOSIS — F848 Other pervasive developmental disorders: Secondary | ICD-10-CM | POA: Diagnosis not present

## 2019-07-01 DIAGNOSIS — M6281 Muscle weakness (generalized): Secondary | ICD-10-CM | POA: Diagnosis not present

## 2019-07-01 DIAGNOSIS — E119 Type 2 diabetes mellitus without complications: Secondary | ICD-10-CM | POA: Diagnosis not present

## 2019-07-01 DIAGNOSIS — R2681 Unsteadiness on feet: Secondary | ICD-10-CM | POA: Diagnosis not present

## 2019-07-02 DIAGNOSIS — M6281 Muscle weakness (generalized): Secondary | ICD-10-CM | POA: Diagnosis not present

## 2019-07-02 DIAGNOSIS — E119 Type 2 diabetes mellitus without complications: Secondary | ICD-10-CM | POA: Diagnosis not present

## 2019-07-02 DIAGNOSIS — R2681 Unsteadiness on feet: Secondary | ICD-10-CM | POA: Diagnosis not present

## 2019-07-02 DIAGNOSIS — F848 Other pervasive developmental disorders: Secondary | ICD-10-CM | POA: Diagnosis not present

## 2019-07-02 DIAGNOSIS — F840 Autistic disorder: Secondary | ICD-10-CM | POA: Diagnosis not present

## 2019-07-03 DIAGNOSIS — E119 Type 2 diabetes mellitus without complications: Secondary | ICD-10-CM | POA: Diagnosis not present

## 2019-07-03 DIAGNOSIS — F848 Other pervasive developmental disorders: Secondary | ICD-10-CM | POA: Diagnosis not present

## 2019-07-03 DIAGNOSIS — F840 Autistic disorder: Secondary | ICD-10-CM | POA: Diagnosis not present

## 2019-07-03 DIAGNOSIS — M6281 Muscle weakness (generalized): Secondary | ICD-10-CM | POA: Diagnosis not present

## 2019-07-03 DIAGNOSIS — R2681 Unsteadiness on feet: Secondary | ICD-10-CM | POA: Diagnosis not present

## 2019-07-04 DIAGNOSIS — R2681 Unsteadiness on feet: Secondary | ICD-10-CM | POA: Diagnosis not present

## 2019-07-04 DIAGNOSIS — M6281 Muscle weakness (generalized): Secondary | ICD-10-CM | POA: Diagnosis not present

## 2019-07-04 DIAGNOSIS — E119 Type 2 diabetes mellitus without complications: Secondary | ICD-10-CM | POA: Diagnosis not present

## 2019-07-04 DIAGNOSIS — F848 Other pervasive developmental disorders: Secondary | ICD-10-CM | POA: Diagnosis not present

## 2019-07-04 DIAGNOSIS — F840 Autistic disorder: Secondary | ICD-10-CM | POA: Diagnosis not present

## 2019-07-05 DIAGNOSIS — M6281 Muscle weakness (generalized): Secondary | ICD-10-CM | POA: Diagnosis not present

## 2019-07-05 DIAGNOSIS — R2681 Unsteadiness on feet: Secondary | ICD-10-CM | POA: Diagnosis not present

## 2019-07-05 DIAGNOSIS — F848 Other pervasive developmental disorders: Secondary | ICD-10-CM | POA: Diagnosis not present

## 2019-07-05 DIAGNOSIS — E119 Type 2 diabetes mellitus without complications: Secondary | ICD-10-CM | POA: Diagnosis not present

## 2019-07-05 DIAGNOSIS — F840 Autistic disorder: Secondary | ICD-10-CM | POA: Diagnosis not present

## 2019-07-08 DIAGNOSIS — F840 Autistic disorder: Secondary | ICD-10-CM | POA: Diagnosis not present

## 2019-07-08 DIAGNOSIS — R2681 Unsteadiness on feet: Secondary | ICD-10-CM | POA: Diagnosis not present

## 2019-07-08 DIAGNOSIS — M6281 Muscle weakness (generalized): Secondary | ICD-10-CM | POA: Diagnosis not present

## 2019-07-08 DIAGNOSIS — E119 Type 2 diabetes mellitus without complications: Secondary | ICD-10-CM | POA: Diagnosis not present

## 2019-07-08 DIAGNOSIS — F848 Other pervasive developmental disorders: Secondary | ICD-10-CM | POA: Diagnosis not present

## 2019-07-09 ENCOUNTER — Other Ambulatory Visit: Payer: Self-pay

## 2019-07-09 ENCOUNTER — Encounter: Payer: Self-pay | Admitting: Podiatry

## 2019-07-09 ENCOUNTER — Ambulatory Visit (INDEPENDENT_AMBULATORY_CARE_PROVIDER_SITE_OTHER): Payer: Medicare Other | Admitting: Podiatry

## 2019-07-09 VITALS — Temp 97.3°F

## 2019-07-09 DIAGNOSIS — E1142 Type 2 diabetes mellitus with diabetic polyneuropathy: Secondary | ICD-10-CM | POA: Diagnosis not present

## 2019-07-09 DIAGNOSIS — M79674 Pain in right toe(s): Secondary | ICD-10-CM | POA: Diagnosis not present

## 2019-07-09 DIAGNOSIS — M6281 Muscle weakness (generalized): Secondary | ICD-10-CM | POA: Diagnosis not present

## 2019-07-09 DIAGNOSIS — F848 Other pervasive developmental disorders: Secondary | ICD-10-CM | POA: Diagnosis not present

## 2019-07-09 DIAGNOSIS — M79675 Pain in left toe(s): Secondary | ICD-10-CM | POA: Diagnosis not present

## 2019-07-09 DIAGNOSIS — F840 Autistic disorder: Secondary | ICD-10-CM | POA: Diagnosis not present

## 2019-07-09 DIAGNOSIS — M2141 Flat foot [pes planus] (acquired), right foot: Secondary | ICD-10-CM

## 2019-07-09 DIAGNOSIS — B351 Tinea unguium: Secondary | ICD-10-CM | POA: Diagnosis not present

## 2019-07-09 DIAGNOSIS — M2142 Flat foot [pes planus] (acquired), left foot: Secondary | ICD-10-CM

## 2019-07-09 DIAGNOSIS — R2681 Unsteadiness on feet: Secondary | ICD-10-CM | POA: Diagnosis not present

## 2019-07-09 DIAGNOSIS — E119 Type 2 diabetes mellitus without complications: Secondary | ICD-10-CM | POA: Diagnosis not present

## 2019-07-09 NOTE — Progress Notes (Signed)
This patient returns to my office for at risk foot care.  This patient requires this care by a professional since this patient will be at risk due to having  Diabetic neuropathy and chronic kidney disease.  This patient is unable to cut nails herself since the patient cannot reach her nails.These nails are painful walking and wearing shoes.  This patient presents for at risk foot care today.She presents to the office with her POA.  General Appearance  Alert, conversant and in no acute stress.  Vascular  Dorsalis pedis and posterior tibial  pulses are palpable  bilaterally.  Capillary return is within normal limits  bilaterally. Temperature is within normal limits  bilaterally.  Neurologic  Senn-Weinstein monofilament wire test within normal limits  bilaterally. Muscle power within normal limits bilaterally.  Nails Thick disfigured discolored nails with subungual debris  from hallux to fifth toes bilaterally. No evidence of bacterial infection or drainage bilaterally.  Orthopedic  No limitations of motion  feet .  No crepitus or effusions noted.  No bony pathology or digital deformities noted.  Skin  normotropic skin with no porokeratosis noted bilaterally.  No signs of infections or ulcers noted.     Onychomycosis  Pain in right toes  Pain in left toes  Consent was obtained for treatment procedures.   Mechanical debridement of nails 1-5  bilaterally performed with a nail nipper.  Filed with dremel without incident.    Return office visit   3 months                   Told patient to return for periodic foot care and evaluation due to potential at risk complications.   Dontavious Emily DPM  

## 2019-07-10 DIAGNOSIS — E119 Type 2 diabetes mellitus without complications: Secondary | ICD-10-CM | POA: Diagnosis not present

## 2019-07-10 DIAGNOSIS — F840 Autistic disorder: Secondary | ICD-10-CM | POA: Diagnosis not present

## 2019-07-10 DIAGNOSIS — M6281 Muscle weakness (generalized): Secondary | ICD-10-CM | POA: Diagnosis not present

## 2019-07-10 DIAGNOSIS — R2681 Unsteadiness on feet: Secondary | ICD-10-CM | POA: Diagnosis not present

## 2019-07-10 DIAGNOSIS — F848 Other pervasive developmental disorders: Secondary | ICD-10-CM | POA: Diagnosis not present

## 2019-07-11 DIAGNOSIS — E119 Type 2 diabetes mellitus without complications: Secondary | ICD-10-CM | POA: Diagnosis not present

## 2019-07-11 DIAGNOSIS — F848 Other pervasive developmental disorders: Secondary | ICD-10-CM | POA: Diagnosis not present

## 2019-07-11 DIAGNOSIS — M6281 Muscle weakness (generalized): Secondary | ICD-10-CM | POA: Diagnosis not present

## 2019-07-11 DIAGNOSIS — F840 Autistic disorder: Secondary | ICD-10-CM | POA: Diagnosis not present

## 2019-07-11 DIAGNOSIS — R2681 Unsteadiness on feet: Secondary | ICD-10-CM | POA: Diagnosis not present

## 2019-07-12 DIAGNOSIS — E119 Type 2 diabetes mellitus without complications: Secondary | ICD-10-CM | POA: Diagnosis not present

## 2019-07-12 DIAGNOSIS — R2681 Unsteadiness on feet: Secondary | ICD-10-CM | POA: Diagnosis not present

## 2019-07-12 DIAGNOSIS — F840 Autistic disorder: Secondary | ICD-10-CM | POA: Diagnosis not present

## 2019-07-12 DIAGNOSIS — M6281 Muscle weakness (generalized): Secondary | ICD-10-CM | POA: Diagnosis not present

## 2019-07-12 DIAGNOSIS — F848 Other pervasive developmental disorders: Secondary | ICD-10-CM | POA: Diagnosis not present

## 2019-07-15 DIAGNOSIS — F848 Other pervasive developmental disorders: Secondary | ICD-10-CM | POA: Diagnosis not present

## 2019-07-15 DIAGNOSIS — M6281 Muscle weakness (generalized): Secondary | ICD-10-CM | POA: Diagnosis not present

## 2019-07-15 DIAGNOSIS — F840 Autistic disorder: Secondary | ICD-10-CM | POA: Diagnosis not present

## 2019-07-15 DIAGNOSIS — R2681 Unsteadiness on feet: Secondary | ICD-10-CM | POA: Diagnosis not present

## 2019-07-15 DIAGNOSIS — E119 Type 2 diabetes mellitus without complications: Secondary | ICD-10-CM | POA: Diagnosis not present

## 2019-07-16 DIAGNOSIS — F840 Autistic disorder: Secondary | ICD-10-CM | POA: Diagnosis not present

## 2019-07-16 DIAGNOSIS — R2681 Unsteadiness on feet: Secondary | ICD-10-CM | POA: Diagnosis not present

## 2019-07-16 DIAGNOSIS — F848 Other pervasive developmental disorders: Secondary | ICD-10-CM | POA: Diagnosis not present

## 2019-07-16 DIAGNOSIS — E119 Type 2 diabetes mellitus without complications: Secondary | ICD-10-CM | POA: Diagnosis not present

## 2019-07-16 DIAGNOSIS — M6281 Muscle weakness (generalized): Secondary | ICD-10-CM | POA: Diagnosis not present

## 2019-07-17 DIAGNOSIS — E119 Type 2 diabetes mellitus without complications: Secondary | ICD-10-CM | POA: Diagnosis not present

## 2019-07-17 DIAGNOSIS — F848 Other pervasive developmental disorders: Secondary | ICD-10-CM | POA: Diagnosis not present

## 2019-07-17 DIAGNOSIS — M6281 Muscle weakness (generalized): Secondary | ICD-10-CM | POA: Diagnosis not present

## 2019-07-17 DIAGNOSIS — R2681 Unsteadiness on feet: Secondary | ICD-10-CM | POA: Diagnosis not present

## 2019-07-17 DIAGNOSIS — F840 Autistic disorder: Secondary | ICD-10-CM | POA: Diagnosis not present

## 2019-07-19 DIAGNOSIS — M6281 Muscle weakness (generalized): Secondary | ICD-10-CM | POA: Diagnosis not present

## 2019-07-19 DIAGNOSIS — E119 Type 2 diabetes mellitus without complications: Secondary | ICD-10-CM | POA: Diagnosis not present

## 2019-07-19 DIAGNOSIS — F848 Other pervasive developmental disorders: Secondary | ICD-10-CM | POA: Diagnosis not present

## 2019-07-19 DIAGNOSIS — F840 Autistic disorder: Secondary | ICD-10-CM | POA: Diagnosis not present

## 2019-07-19 DIAGNOSIS — R2681 Unsteadiness on feet: Secondary | ICD-10-CM | POA: Diagnosis not present

## 2019-07-22 DIAGNOSIS — F848 Other pervasive developmental disorders: Secondary | ICD-10-CM | POA: Diagnosis not present

## 2019-07-22 DIAGNOSIS — E119 Type 2 diabetes mellitus without complications: Secondary | ICD-10-CM | POA: Diagnosis not present

## 2019-07-22 DIAGNOSIS — F840 Autistic disorder: Secondary | ICD-10-CM | POA: Diagnosis not present

## 2019-07-22 DIAGNOSIS — R2681 Unsteadiness on feet: Secondary | ICD-10-CM | POA: Diagnosis not present

## 2019-07-22 DIAGNOSIS — M6281 Muscle weakness (generalized): Secondary | ICD-10-CM | POA: Diagnosis not present

## 2019-07-23 DIAGNOSIS — F840 Autistic disorder: Secondary | ICD-10-CM | POA: Diagnosis not present

## 2019-07-23 DIAGNOSIS — F848 Other pervasive developmental disorders: Secondary | ICD-10-CM | POA: Diagnosis not present

## 2019-07-23 DIAGNOSIS — M6281 Muscle weakness (generalized): Secondary | ICD-10-CM | POA: Diagnosis not present

## 2019-07-23 DIAGNOSIS — R2681 Unsteadiness on feet: Secondary | ICD-10-CM | POA: Diagnosis not present

## 2019-07-23 DIAGNOSIS — E119 Type 2 diabetes mellitus without complications: Secondary | ICD-10-CM | POA: Diagnosis not present

## 2019-07-24 DIAGNOSIS — E119 Type 2 diabetes mellitus without complications: Secondary | ICD-10-CM | POA: Diagnosis not present

## 2019-07-24 DIAGNOSIS — R2681 Unsteadiness on feet: Secondary | ICD-10-CM | POA: Diagnosis not present

## 2019-07-24 DIAGNOSIS — F840 Autistic disorder: Secondary | ICD-10-CM | POA: Diagnosis not present

## 2019-07-24 DIAGNOSIS — F848 Other pervasive developmental disorders: Secondary | ICD-10-CM | POA: Diagnosis not present

## 2019-07-24 DIAGNOSIS — M6281 Muscle weakness (generalized): Secondary | ICD-10-CM | POA: Diagnosis not present

## 2019-07-25 DIAGNOSIS — F848 Other pervasive developmental disorders: Secondary | ICD-10-CM | POA: Diagnosis not present

## 2019-07-25 DIAGNOSIS — R2681 Unsteadiness on feet: Secondary | ICD-10-CM | POA: Diagnosis not present

## 2019-07-25 DIAGNOSIS — F840 Autistic disorder: Secondary | ICD-10-CM | POA: Diagnosis not present

## 2019-07-25 DIAGNOSIS — M6281 Muscle weakness (generalized): Secondary | ICD-10-CM | POA: Diagnosis not present

## 2019-07-25 DIAGNOSIS — E119 Type 2 diabetes mellitus without complications: Secondary | ICD-10-CM | POA: Diagnosis not present

## 2019-07-28 ENCOUNTER — Encounter (HOSPITAL_COMMUNITY): Payer: Self-pay | Admitting: *Deleted

## 2019-07-28 ENCOUNTER — Other Ambulatory Visit: Payer: Self-pay

## 2019-07-28 ENCOUNTER — Inpatient Hospital Stay (HOSPITAL_COMMUNITY)
Admission: EM | Admit: 2019-07-28 | Discharge: 2019-08-01 | DRG: 193 | Disposition: A | Discharge status: Hospice | Payer: Medicare Other | Source: Skilled Nursing Facility | Attending: Internal Medicine | Admitting: Internal Medicine

## 2019-07-28 ENCOUNTER — Emergency Department (HOSPITAL_COMMUNITY): Payer: Medicare Other

## 2019-07-28 DIAGNOSIS — A419 Sepsis, unspecified organism: Secondary | ICD-10-CM | POA: Diagnosis not present

## 2019-07-28 DIAGNOSIS — F32A Depression, unspecified: Secondary | ICD-10-CM

## 2019-07-28 DIAGNOSIS — R410 Disorientation, unspecified: Secondary | ICD-10-CM | POA: Diagnosis not present

## 2019-07-28 DIAGNOSIS — M8580 Other specified disorders of bone density and structure, unspecified site: Secondary | ICD-10-CM | POA: Diagnosis present

## 2019-07-28 DIAGNOSIS — J9601 Acute respiratory failure with hypoxia: Secondary | ICD-10-CM | POA: Diagnosis present

## 2019-07-28 DIAGNOSIS — E1165 Type 2 diabetes mellitus with hyperglycemia: Secondary | ICD-10-CM | POA: Diagnosis not present

## 2019-07-28 DIAGNOSIS — F0390 Unspecified dementia without behavioral disturbance: Secondary | ICD-10-CM | POA: Diagnosis present

## 2019-07-28 DIAGNOSIS — N183 Chronic kidney disease, stage 3 unspecified: Secondary | ICD-10-CM | POA: Diagnosis present

## 2019-07-28 DIAGNOSIS — E1142 Type 2 diabetes mellitus with diabetic polyneuropathy: Secondary | ICD-10-CM | POA: Diagnosis present

## 2019-07-28 DIAGNOSIS — Z7984 Long term (current) use of oral hypoglycemic drugs: Secondary | ICD-10-CM

## 2019-07-28 DIAGNOSIS — M2550 Pain in unspecified joint: Secondary | ICD-10-CM | POA: Diagnosis not present

## 2019-07-28 DIAGNOSIS — F840 Autistic disorder: Secondary | ICD-10-CM

## 2019-07-28 DIAGNOSIS — J189 Pneumonia, unspecified organism: Secondary | ICD-10-CM | POA: Diagnosis present

## 2019-07-28 DIAGNOSIS — R531 Weakness: Secondary | ICD-10-CM | POA: Diagnosis not present

## 2019-07-28 DIAGNOSIS — E1121 Type 2 diabetes mellitus with diabetic nephropathy: Secondary | ICD-10-CM | POA: Diagnosis not present

## 2019-07-28 DIAGNOSIS — N1831 Chronic kidney disease, stage 3a: Secondary | ICD-10-CM | POA: Diagnosis present

## 2019-07-28 DIAGNOSIS — Z8616 Personal history of COVID-19: Secondary | ICD-10-CM

## 2019-07-28 DIAGNOSIS — I130 Hypertensive heart and chronic kidney disease with heart failure and stage 1 through stage 4 chronic kidney disease, or unspecified chronic kidney disease: Secondary | ICD-10-CM | POA: Diagnosis present

## 2019-07-28 DIAGNOSIS — Z515 Encounter for palliative care: Secondary | ICD-10-CM | POA: Diagnosis not present

## 2019-07-28 DIAGNOSIS — F419 Anxiety disorder, unspecified: Secondary | ICD-10-CM

## 2019-07-28 DIAGNOSIS — Z9849 Cataract extraction status, unspecified eye: Secondary | ICD-10-CM | POA: Diagnosis not present

## 2019-07-28 DIAGNOSIS — R404 Transient alteration of awareness: Secondary | ICD-10-CM | POA: Diagnosis not present

## 2019-07-28 DIAGNOSIS — N1832 Chronic kidney disease, stage 3b: Secondary | ICD-10-CM | POA: Diagnosis not present

## 2019-07-28 DIAGNOSIS — I10 Essential (primary) hypertension: Secondary | ICD-10-CM | POA: Diagnosis not present

## 2019-07-28 DIAGNOSIS — Z8673 Personal history of transient ischemic attack (TIA), and cerebral infarction without residual deficits: Secondary | ICD-10-CM

## 2019-07-28 DIAGNOSIS — I491 Atrial premature depolarization: Secondary | ICD-10-CM | POA: Diagnosis not present

## 2019-07-28 DIAGNOSIS — D509 Iron deficiency anemia, unspecified: Secondary | ICD-10-CM | POA: Diagnosis present

## 2019-07-28 DIAGNOSIS — Z7982 Long term (current) use of aspirin: Secondary | ICD-10-CM

## 2019-07-28 DIAGNOSIS — Z8619 Personal history of other infectious and parasitic diseases: Secondary | ICD-10-CM

## 2019-07-28 DIAGNOSIS — I5032 Chronic diastolic (congestive) heart failure: Secondary | ICD-10-CM | POA: Diagnosis present

## 2019-07-28 DIAGNOSIS — Z20822 Contact with and (suspected) exposure to covid-19: Secondary | ICD-10-CM | POA: Diagnosis not present

## 2019-07-28 DIAGNOSIS — Z8571 Personal history of Hodgkin lymphoma: Secondary | ICD-10-CM | POA: Diagnosis not present

## 2019-07-28 DIAGNOSIS — E1122 Type 2 diabetes mellitus with diabetic chronic kidney disease: Secondary | ICD-10-CM | POA: Diagnosis present

## 2019-07-28 DIAGNOSIS — Z90710 Acquired absence of both cervix and uterus: Secondary | ICD-10-CM

## 2019-07-28 DIAGNOSIS — Z79899 Other long term (current) drug therapy: Secondary | ICD-10-CM

## 2019-07-28 DIAGNOSIS — Z66 Do not resuscitate: Secondary | ICD-10-CM | POA: Diagnosis present

## 2019-07-28 DIAGNOSIS — E1169 Type 2 diabetes mellitus with other specified complication: Secondary | ICD-10-CM | POA: Diagnosis present

## 2019-07-28 DIAGNOSIS — Z8249 Family history of ischemic heart disease and other diseases of the circulatory system: Secondary | ICD-10-CM

## 2019-07-28 DIAGNOSIS — K219 Gastro-esophageal reflux disease without esophagitis: Secondary | ICD-10-CM | POA: Diagnosis present

## 2019-07-28 DIAGNOSIS — Z880 Allergy status to penicillin: Secondary | ICD-10-CM

## 2019-07-28 DIAGNOSIS — R0902 Hypoxemia: Secondary | ICD-10-CM | POA: Diagnosis not present

## 2019-07-28 DIAGNOSIS — F0391 Unspecified dementia with behavioral disturbance: Secondary | ICD-10-CM

## 2019-07-28 DIAGNOSIS — E785 Hyperlipidemia, unspecified: Secondary | ICD-10-CM | POA: Diagnosis present

## 2019-07-28 DIAGNOSIS — G8929 Other chronic pain: Secondary | ICD-10-CM | POA: Diagnosis present

## 2019-07-28 DIAGNOSIS — E1129 Type 2 diabetes mellitus with other diabetic kidney complication: Secondary | ICD-10-CM | POA: Diagnosis present

## 2019-07-28 DIAGNOSIS — Z7401 Bed confinement status: Secondary | ICD-10-CM | POA: Diagnosis not present

## 2019-07-28 LAB — TROPONIN I (HIGH SENSITIVITY): Troponin I (High Sensitivity): 8 ng/L (ref <18)

## 2019-07-28 LAB — CBC WITH DIFFERENTIAL/PLATELET
Abs Immature Granulocytes: 0.07 10*3/uL (ref 0.00–0.07)
Basophils Absolute: 0 10*3/uL (ref 0.0–0.1)
Basophils Relative: 0 %
Eosinophils Absolute: 0.2 10*3/uL (ref 0.0–0.5)
Eosinophils Relative: 2 %
HCT: 39.4 % (ref 36.0–46.0)
Hemoglobin: 11.9 g/dL — ABNORMAL LOW (ref 12.0–15.0)
Immature Granulocytes: 1 %
Lymphocytes Relative: 13 %
Lymphs Abs: 1.4 10*3/uL (ref 0.7–4.0)
MCH: 26 pg (ref 26.0–34.0)
MCHC: 30.2 g/dL (ref 30.0–36.0)
MCV: 86.2 fL (ref 80.0–100.0)
Monocytes Absolute: 0.5 10*3/uL (ref 0.1–1.0)
Monocytes Relative: 5 %
Neutro Abs: 8.7 10*3/uL — ABNORMAL HIGH (ref 1.7–7.7)
Neutrophils Relative %: 79 %
Platelets: 273 10*3/uL (ref 150–400)
RBC: 4.57 MIL/uL (ref 3.87–5.11)
RDW: 16.1 % — ABNORMAL HIGH (ref 11.5–15.5)
WBC: 10.9 10*3/uL — ABNORMAL HIGH (ref 4.0–10.5)
nRBC: 0 % (ref 0.0–0.2)

## 2019-07-28 LAB — PROCALCITONIN: Procalcitonin: <0.1 ng/mL

## 2019-07-28 LAB — URINALYSIS, ROUTINE W REFLEX MICROSCOPIC
Bacteria, UA: NONE SEEN
Bilirubin Urine: NEGATIVE
Glucose, UA: 150 mg/dL — AB
Hgb urine dipstick: NEGATIVE
Ketones, ur: 5 mg/dL — AB
Leukocytes,Ua: NEGATIVE
Nitrite: NEGATIVE
Protein, ur: 30 mg/dL — AB
Specific Gravity, Urine: 1.02 (ref 1.005–1.030)
pH: 6 (ref 5.0–8.0)

## 2019-07-28 LAB — RESPIRATORY PANEL BY RT PCR (FLU A&B, COVID)
Influenza A by PCR: NEGATIVE
Influenza B by PCR: NEGATIVE
SARS Coronavirus 2 by RT PCR: NEGATIVE

## 2019-07-28 LAB — COMPREHENSIVE METABOLIC PANEL
ALT: 26 U/L (ref 0–44)
AST: 23 U/L (ref 15–41)
Albumin: 3.8 g/dL (ref 3.5–5.0)
Alkaline Phosphatase: 83 U/L (ref 38–126)
Anion gap: 9 (ref 5–15)
BUN: 26 mg/dL — ABNORMAL HIGH (ref 8–23)
CO2: 25 mmol/L (ref 22–32)
Calcium: 9.4 mg/dL (ref 8.9–10.3)
Chloride: 102 mmol/L (ref 98–111)
Creatinine, Ser: 1 mg/dL (ref 0.44–1.00)
GFR calc Af Amer: 59 mL/min — ABNORMAL LOW (ref >60)
GFR calc non Af Amer: 51 mL/min — ABNORMAL LOW (ref >60)
Glucose, Bld: 193 mg/dL — ABNORMAL HIGH (ref 70–99)
Potassium: 4.6 mmol/L (ref 3.5–5.1)
Sodium: 136 mmol/L (ref 135–145)
Total Bilirubin: 0.6 mg/dL (ref 0.3–1.2)
Total Protein: 7.1 g/dL (ref 6.5–8.1)

## 2019-07-28 LAB — LACTIC ACID, PLASMA
Lactic Acid, Venous: 2.9 mmol/L (ref 0.5–1.9)
Lactic Acid, Venous: 2.4 mmol/L (ref 0.5–1.9)

## 2019-07-28 LAB — PROTIME-INR
INR: 1.1 (ref 0.8–1.2)
Prothrombin Time: 14.1 seconds (ref 11.4–15.2)

## 2019-07-28 LAB — HEMOGLOBIN A1C
Hgb A1c MFr Bld: 8.1 % — ABNORMAL HIGH (ref 4.8–5.6)
Mean Plasma Glucose: 185.77 mg/dL

## 2019-07-28 LAB — APTT: aPTT: 26 seconds (ref 24–36)

## 2019-07-28 MED ORDER — SODIUM CHLORIDE 0.9 % IV SOLN
2.0000 g | Freq: Once | INTRAVENOUS | Status: AC
  Administered 2019-07-28: 2 g via INTRAVENOUS
  Filled 2019-07-28: qty 2

## 2019-07-28 MED ORDER — ONDANSETRON HCL 4 MG/2ML IJ SOLN: 4.0000 mg | Freq: Four times a day (QID) | INTRAMUSCULAR | Status: DC | PRN

## 2019-07-28 MED ORDER — PANTOPRAZOLE SODIUM 40 MG PO TBEC
40.0000 mg | DELAYED_RELEASE_TABLET | Freq: Every day | ORAL | Status: DC
  Administered 2019-07-29 – 2019-08-01 (×4): 40 mg via ORAL
  Filled 2019-07-28 (×4): qty 1

## 2019-07-28 MED ORDER — ONDANSETRON HCL 4 MG PO TABS: 4.0000 mg | ORAL_TABLET | Freq: Four times a day (QID) | ORAL | Status: DC | PRN

## 2019-07-28 MED ORDER — HYDRALAZINE HCL 10 MG PO TABS
10.0000 mg | ORAL_TABLET | Freq: Three times a day (TID) | ORAL | Status: DC
  Administered 2019-07-28 – 2019-08-01 (×12): 10 mg via ORAL
  Filled 2019-07-28 (×13): qty 1

## 2019-07-28 MED ORDER — ASPIRIN EC 81 MG PO TBEC
81.0000 mg | DELAYED_RELEASE_TABLET | Freq: Every day | ORAL | Status: DC
  Administered 2019-07-29 – 2019-08-01 (×4): 81 mg via ORAL
  Filled 2019-07-28 (×4): qty 1

## 2019-07-28 MED ORDER — LACTATED RINGERS IV BOLUS (SEPSIS)
1000.0000 mL | Freq: Once | INTRAVENOUS | Status: AC
  Administered 2019-07-28: 1000 mL via INTRAVENOUS

## 2019-07-28 MED ORDER — ENOXAPARIN SODIUM 40 MG/0.4ML ~~LOC~~ SOLN
40.0000 mg | SUBCUTANEOUS | Status: DC
  Administered 2019-07-28 – 2019-07-31 (×4): 40 mg via SUBCUTANEOUS
  Filled 2019-07-28 (×4): qty 0.4

## 2019-07-28 MED ORDER — INSULIN ASPART 100 UNIT/ML ~~LOC~~ SOLN
0.0000 [IU] | Freq: Three times a day (TID) | SUBCUTANEOUS | Status: DC
  Administered 2019-07-29 – 2019-07-30 (×3): 1 [IU] via SUBCUTANEOUS
  Administered 2019-07-30: 3 [IU] via SUBCUTANEOUS
  Administered 2019-07-31: 1 [IU] via SUBCUTANEOUS
  Administered 2019-07-31: 2 [IU] via SUBCUTANEOUS
  Administered 2019-08-01 (×2): 1 [IU] via SUBCUTANEOUS
  Filled 2019-07-28: qty 0.09

## 2019-07-28 MED ORDER — VANCOMYCIN HCL IN DEXTROSE 1-5 GM/200ML-% IV SOLN
1000.0000 mg | Freq: Once | INTRAVENOUS | Status: AC
  Administered 2019-07-28: 1000 mg via INTRAVENOUS
  Filled 2019-07-28: qty 200

## 2019-07-28 MED ORDER — SODIUM CHLORIDE 0.9 % IV SOLN
500.0000 mg | INTRAVENOUS | Status: AC
  Administered 2019-07-28 – 2019-07-29 (×2): 500 mg via INTRAVENOUS
  Filled 2019-07-28 (×2): qty 500

## 2019-07-28 MED ORDER — SODIUM CHLORIDE 0.9 % IV SOLN
2.0000 g | INTRAVENOUS | Status: DC
  Administered 2019-07-29: 2 g via INTRAVENOUS
  Filled 2019-07-28: qty 20
  Filled 2019-07-28: qty 2

## 2019-07-28 MED ORDER — ACETAMINOPHEN 650 MG RE SUPP: 650.0000 mg | Freq: Four times a day (QID) | RECTAL | Status: DC | PRN

## 2019-07-28 MED ORDER — FESOTERODINE FUMARATE ER 8 MG PO TB24
8.0000 mg | ORAL_TABLET | Freq: Every day | ORAL | Status: DC
  Administered 2019-07-29 – 2019-08-01 (×4): 8 mg via ORAL
  Filled 2019-07-28 (×4): qty 1

## 2019-07-28 MED ORDER — ACETAMINOPHEN 325 MG PO TABS: 650.0000 mg | ORAL_TABLET | Freq: Four times a day (QID) | ORAL | Status: DC | PRN

## 2019-07-28 MED ORDER — VERAPAMIL HCL ER 120 MG PO TBCR
120.0000 mg | EXTENDED_RELEASE_TABLET | Freq: Every day | ORAL | Status: DC
  Administered 2019-07-28 – 2019-07-31 (×4): 120 mg via ORAL
  Filled 2019-07-28 (×5): qty 1

## 2019-07-28 MED ORDER — ATORVASTATIN CALCIUM 20 MG PO TABS
20.0000 mg | ORAL_TABLET | Freq: Every day | ORAL | Status: DC
  Administered 2019-07-28 – 2019-08-01 (×5): 20 mg via ORAL
  Filled 2019-07-28: qty 2
  Filled 2019-07-28 (×4): qty 1

## 2019-07-28 NOTE — ED Notes (Signed)
Sent message to pharmacy to verify medications 
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
## 2019-07-28 NOTE — H&P (Addendum)
History and Physical    Lisa Villegas SSN-[id number] DOB: [birth year] DOA: 07/28/2019  PCP: [name], MD  Patient coming from: [facility]  I have personally briefly reviewed patient's old medical records in Lisa Villegas  Chief Complaint: Weakness/lethargy and hypoxia  HPI: Lisa Villegas is a 86 y.o. female with medical history significant for autism spectrum disorder, history of CVA, chronic diastolic CHF (EF Q000111Q, G2 DD by TTE 07/26/2017), type 2 diabetes, CKD stage III, hypertension, hyperlipidemia who presents to the ED for evaluation of weakness/lethargy and hypoxia.  History is limited from patient due to underlying mental status and therefore supplemented by EDP and chart review.  Patient was noted to have a couple days of generalized weakness and lethargy at her ALF.  EMS were called to the facility earlier today and she was noted to have hypoxia with O2 saturation of 88%.  She is not on supplemental oxygen normally.  She was brought to the ED for further evaluation.  At time of admission patient currently has no complaints but otherwise not contributing further to relevant history.  Of note patient had a prior positive SARS-CoV-2 test on 04/03/2019 when she was previously hospitalized.  Per discharge summary, at that time she was not hypoxic and did not require directed therapy with COVID-19 infection.  ADDENDUM: I was able to get in contact with patient's POA Lisa Villegas.  She states that patient has autism with severe social dysfunction.  She says patient is highly intelligent and did complete a doctorate in mathematics at a young age however has had no interest in caring for herself or completing ADLs such as bathing herself since she was a child.  POA states that she likely can ambulate, eat, and perform activities on her own but just does not want to and has essentially stayed in bed at her ALF over the last couple months.  She also states that patient  generally only answers questions with yes/no but will answer further if told that she is being tested.  She states that patient is able to eat without evidence of swallowing dysfunction or aspiration.  ED Course:  Initial vitals showed BP 144/105, pulse 109, RR 26, temp 99.1 Fahrenheit, SPO2 94% on 3 L supplemental O2 via [place].  Labs are notable for WBC 10.9, hemoglobin 11.9, platelets 273,000, sodium 136, potassium 4.6, bicarb 25, BUN 26, creatinine 1.00, serum glucose 193, LFTs within normal limits, lactic acid 2.9, high-sensitivity troponin I 8.  Urinalysis shows negative nitrates, negative leukocytes, 0-5 RBCs and WBCs/hpf, no bacteria on microscopy.  Blood and urine cultures were obtained and pending.  SARS-CoV-2 respiratory panel was collected and pending.  Portable chest x-ray shows right upper lobe consolidation.  Patient was given 1 L LR and started on empiric IV vancomycin and cefepime.  The hospitalist service was consulted to admit for further evaluation and management.  Review of Systems:  Full review of systems limited due to patient's mental status.   Past Medical History:  Diagnosis Date  . Anemia   . Anxiety and depression    per med rec  . 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 Autism   . Chronic CHF (congestive heart failure) (HCC)    per med rec  . Chronic kidney disease (CKD)    stage 3 per chart in 09/2015  . Diabetes mellitus without complication ([place])   . Diabetic neuropathy ([place])   . History of thyroid nodule    nontoxic goiter per med rec  .  Hodgkin disease ([place])    in 78  . HTN (hypertension), benign   . Hyperlipidemia   . Incontinence   . Lack of sensation   . Osteopenia    per med rec  . Poor balance   . Stroke ([place])   . Tubular adenoma of colon 01/2017  . Vitamin D deficiency     Past Surgical History:  Procedure Laterality Date  . ABDOMINAL HYSTERECTOMY    . BREAST LUMPECTOMY Left     Social History:  reports that she has never smoked. She has  never used smokeless tobacco. She reports that she does not drink alcohol or use drugs.  Allergies  Allergen Reactions  . Penicillins Rash    On MAR: Has patient had a PCN reaction causing immediate rash, facial/tongue/throat swelling, SOB or lightheadedness with hypotension: Yes Has patient had a PCN reaction causing severe rash involving mucus membranes or skin necrosis: Unk Has patient had a PCN reaction that required hospitalization: Unk Has patient had a PCN reaction occurring within the last 10 years: Unk If all of the above answers are "NO", then may proceed with Cephalosporin use.     Family History  Problem Relation Age of Onset  . CAD Mother        died at 44 yo  . Atrial fibrillation Mother        and tachycardia  . CAD Father        died at 99 yo  . Atrial fibrillation Father        and tachycardia     Prior to Admission medications   Medication Sig Start Date End Date Taking? Authorizing Provider  aspirin 81 MG tablet Take 1 tablet (81 mg total) by mouth daily. 07/26/17  Yes Patrecia Pour, MD  atorvastatin (LIPITOR) 20 MG tablet Take 1 tablet (20 mg total) by mouth every evening. 10/20/16  Yes Henson, Vickie L, NP-C  calcium carbonate (TUMS EX) 750 MG chewable tablet Chew 1 tablet by mouth daily.   Yes [provider]  Cholecalciferol (VITAMIN D3) 2000 UNITS TABS Take 1 tablet by mouth daily.    Yes [provider]  Fe Fum-FePoly-Vit C-Vit B3 (INTEGRA PO) Take by mouth. 120-40-3mg  capsule,1cap po daily   Yes [provider]  ferrous sulfate 325 (65 FE) MG tablet Take 325 mg by mouth daily.    Yes [provider]  fesoterodine (TOVIAZ) 8 MG TB24 tablet TAKE 1 TAB BY MOUTH EVERY DAY.  DO NOT CRUSH OR CHEW . Patient taking differently: Take 8 mg by mouth daily.  10/25/16  Yes Denita Lung, MD  hydrALAZINE (APRESOLINE) 10 MG tablet Take 1 tablet (10 mg total) by mouth 3 (three) times daily. 08/03/17  Yes Rai, Ripudeep K, MD  hydrALAZINE  (APRESOLINE) 25 MG tablet Take 25 mg by mouth 3 (three) times daily as needed (for SBP 150 or greater and DBP 85 or greter).   Yes [provider]  Infant Care Products (BABY SHAMPOO) SHAM Place 1 application into both eyes See admin instructions. Use to cleanse eyes and eyelids at bedtime    Yes [provider]  magnesium oxide (MAG-OX) 400 MG tablet Take 400 mg by mouth every morning.    Yes [provider]  metFORMIN (GLUCOPHAGE) 500 MG tablet Take 500 mg by mouth 2 (two) times daily with a meal.   Yes [provider]  Multiple Vitamins-Minerals (CERTA-VITE PO) Take 1 tablet by mouth daily.    Yes [provider]  Nutritional Supplements (GLUCERNA PO) Take 1 Can by mouth 2 (two) times daily.    Yes [provider]  omeprazole (PRILOSEC) 20 MG capsule Take 20 mg by mouth daily.  05/01/18  Yes [provider]  Sodium Fluoride (PREVIDENT 5000 BOOSTER PLUS DT) Place 1 application onto teeth every evening.    Yes [provider]  Stannous Fluoride (SENSODYNE COMPLETE PROTECTION MT) Use as directed in the mouth or throat. Mint,place one application onto teeth daily as directed   Yes [provider]  verapamil (CALAN-SR) 120 MG CR tablet Take 120 mg by mouth at bedtime.   Yes [provider]    Physical Exam: Vitals:   07/28/19 1900 07/28/19 1930 07/28/19 2000 07/28/19 2030  BP: (!) 162/94 (!) 181/90 (!) 189/74 (!) 171/72  Pulse: (!) 102 (!) 107 100 95  Resp: (!) 24 19 (!) 26 (!) 21  Temp:      TempSrc:      SpO2: 96% 97% 98% 96%  Weight:      Height:       Exam limited due to cooperation. Constitutional: Resting supine in bed, NAD, calm, comfortable Eyes: PERRL, lids and conjunctivae normal ENMT: Mucous membranes are moist. Posterior pharynx clear of any exudate or lesions. Neck: normal, supple, no masses. Respiratory: clear to auscultation anteriorly. Normal respiratory effort. No accessory muscle use.    Cardiovascular: Regular rate and rhythm, no murmurs / rubs / gallops.  Trace bilateral lower extremity edema. 2+ pedal pulses. Abdomen: no tenderness, no masses palpated. No hepatosplenomegaly. Bowel sounds positive.  Musculoskeletal: no clubbing / cyanosis. No joint deformity upper and lower extremities. Good ROM of both upper extremities, limited movement of both lower extremities. No contractures. Normal muscle tone.  Skin: no rashes, lesions, ulcers. No induration Neurologic: CN 2-12 grossly intact. Sensation intact, Strength 5/5 in both upper extremities, strength diminished bilateral lower extremities.  Tremor of right hand. Psychiatric: Awake and alert.  She is oriented to self and knows that she is in the hospital but not which one.  Does not answer when asked what year or month it is.  Labs on Admission: I have personally reviewed following labs and imaging studies  CBC: Recent Labs  Lab 07/28/19 1828  WBC 10.9*  NEUTROABS 8.7*  HGB 11.9*  HCT 39.4  MCV 86.2  PLT 123456   Basic Metabolic Panel: Recent Labs  Lab 07/28/19 1828  NA 136  K 4.6  CL 102  CO2 25  GLUCOSE 193*  BUN 26*  CREATININE 1.00  CALCIUM 9.4   GFR: Estimated Creatinine Clearance: 37.8 mL/min (by C-G formula based on SCr of 1 mg/dL). Liver Function Tests: Recent Labs  Lab 07/28/19 1828  AST 23  ALT 26  ALKPHOS 83  BILITOT 0.6  PROT 7.1  ALBUMIN 3.8   No results for input(s): LIPASE, AMYLASE in the last 168 hours. No results for input(s): AMMONIA in the last 168 hours. Coagulation Profile: Recent Labs  Lab 07/28/19 1828  INR 1.1   Cardiac Enzymes: No results for input(s): CKTOTAL, CKMB, CKMBINDEX, TROPONINI in the last 168 hours. BNP (last 3 results) No results for input(s): PROBNP in the last 8760 hours. HbA1C: No results for input(s): HGBA1C in the last 72 hours. CBG: No results for input(s): GLUCAP in the last 168 hours. Lipid Profile: No results for input(s): CHOL, HDL, LDLCALC,  TRIG, CHOLHDL, LDLDIRECT in the last 72 hours. Thyroid Function Tests: No results for input(s): TSH, T4TOTAL, FREET4, T3FREE,  THYROIDAB in the last 72 hours. Anemia Panel: No results for input(s): VITAMINB12, FOLATE, FERRITIN, TIBC, IRON, RETICCTPCT in the last 72 hours. Urine analysis:    Component Value Date/Time   COLORURINE YELLOW 07/28/2019 1828   APPEARANCEUR CLEAR 07/28/2019 1828   APPEARANCEUR Clear 10/11/2017 1310   LABSPEC 1.020 07/28/2019 1828   PHURINE 6.0 07/28/2019 1828   GLUCOSEU 150 (A) 07/28/2019 1828   HGBUR NEGATIVE 07/28/2019 1828   BILIRUBINUR NEGATIVE 07/28/2019 1828   BILIRUBINUR Negative 10/11/2017 1310   KETONESUR 5 (A) 07/28/2019 1828   PROTEINUR 30 (A) 07/28/2019 1828   UROBILINOGEN negative 03/14/2016 1227   NITRITE NEGATIVE 07/28/2019 1828   LEUKOCYTESUR NEGATIVE 07/28/2019 1828    Radiological Exams on Admission: DG Chest Port 1 View  Result Date: 07/28/2019 CLINICAL DATA:  Weakness EXAM: PORTABLE CHEST 1 VIEW COMPARISON:  03/30/2019 FINDINGS: Large area of consolidation in the right upper lobe. Lungs are otherwise clear. No pneumothorax or pleural effusion. Mild cardiomegaly with calcific aortic atherosclerosis. IMPRESSION: Right upper lobe consolidation, likely pneumonia. Electronically Signed   By: Ulyses Jarred M.D.   On: 07/28/2019 19:02    EKG: Independently reviewed. Ventricular bigeminy, RAD.  Similar to prior.  Assessment/Plan Principal Problem:   Right upper lobe pneumonia Active Problems:   Type II diabetes mellitus with renal manifestations (HCC)   Hypertension   Hyperlipidemia   CKD (chronic kidney disease), stage III (HCC)   Chronic diastolic heart failure, NYHA class 2 (HCC)   History of CVA (cerebrovascular accident)   Acute respiratory failure with hypoxia ([place])  Arnetia Fusilier is a 86 y.o. female with medical history significant for dementia, history of CVA, chronic diastolic CHF (EF Q000111Q, G2 DD by TTE 07/26/2017), type 2  diabetes, CKD stage III, hypertension, hyperlipidemia who is admitted with acute respiratory failure with hypoxia due to right upper lobe pneumonia.  Acute respiratory failure with hypoxia due to right upper lobe pneumonia: -Continue IV ceftriaxone and azithromycin -Continue supplemental oxygen as needed and wean down as able -Check urine strep pneumo and Legionella -Follow blood cultures -Follow SARS-CoV-2 PCR (prior positive on 04/03/2019 -did not require treatment at that time)  Type 2 diabetes: Hold home Metformin and place on sensitive SSI while in hospital.  CKD stage III: Chronic and stable, continue to monitor.  Chronic diastolic CHF: EF Q000111Q, G2 DD by TTE 07/26/2017.  She has trace edema on bilateral lower extremities but otherwise does not appear decompensated from CHF standpoint.  She is not requiring diuretic therapy as an outpatient. -Follow strict I/O's and daily weights  History of CVA: -Continue aspirin 81 mg and atorvastatin  Hypertension: -Continue home hydralazine and verapamil  Hyperlipidemia: -Continue atorvastatin  DVT prophylaxis: Lovenox Code Status: DNR, confirmed by POA Family Communication: Discussed with POA Lisa Villegas by phone at [phone] Disposition Plan: From [facility], likely discharge to same facility in 1-2 days Consults called: None Admission status:  Status is: Inpatient  Remains inpatient appropriate because:IV treatments appropriate due to intensity of illness or inability to take PO   Dispo: The patient is from: ALF              Anticipated d/c is to: ALF              Anticipated d/c date is: 2 days              Patient currently is not medically stable to d/c.   Zada Finders MD [facility]  If 7PM-7AM, please contact night-coverage  [email or web address]  07/28/2019, 9:03 PM

## 2019-07-28 NOTE — ED Notes (Signed)
Lab contacted to add on additional labs.

## 2019-07-28 NOTE — ED Notes (Signed)
Lisa Villegas [phone], said she is her POA and friend can give you any information you need.

## 2019-07-28 NOTE — ED Triage Notes (Signed)
Pt BIB EMS and pt is coming from [place].  Pt has increasing lethargy and weakness since yesterday.  Pt had a fall yesterday but sustained injuries. Hx dementia and is confused at baseline.

## 2019-07-28 NOTE — ED Notes (Addendum)
Date and time results received: 07/28/19 7:31 PM  Test: Lactic Acid Critical Value: 2.9  Name of Provider Notified: Tyrone Nine, DO  Orders Received? Or Actions Taken?: Check MAR

## 2019-07-29 ENCOUNTER — Encounter (HOSPITAL_COMMUNITY): Payer: Self-pay | Admitting: Internal Medicine

## 2019-07-29 ENCOUNTER — Inpatient Hospital Stay (HOSPITAL_COMMUNITY): Payer: Medicare Other

## 2019-07-29 DIAGNOSIS — A419 Sepsis, unspecified organism: Secondary | ICD-10-CM

## 2019-07-29 DIAGNOSIS — J9601 Acute respiratory failure with hypoxia: Secondary | ICD-10-CM

## 2019-07-29 LAB — STREP PNEUMONIAE URINARY ANTIGEN: Strep Pneumo Urinary Antigen: NEGATIVE

## 2019-07-29 LAB — MRSA PCR SCREENING: MRSA by PCR: NEGATIVE

## 2019-07-29 LAB — URINE CULTURE: Culture: NO GROWTH

## 2019-07-29 LAB — BASIC METABOLIC PANEL
Anion gap: 8 (ref 5–15)
BUN: 20 mg/dL (ref 8–23)
CO2: 24 mmol/L (ref 22–32)
Calcium: 8.7 mg/dL — ABNORMAL LOW (ref 8.9–10.3)
Chloride: 102 mmol/L (ref 98–111)
Creatinine, Ser: 0.75 mg/dL (ref 0.44–1.00)
GFR calc Af Amer: >60 mL/min (ref >60)
GFR calc non Af Amer: >60 mL/min (ref >60)
Glucose, Bld: 145 mg/dL — ABNORMAL HIGH (ref 70–99)
Potassium: 4 mmol/L (ref 3.5–5.1)
Sodium: 134 mmol/L — ABNORMAL LOW (ref 135–145)

## 2019-07-29 LAB — TSH: TSH: 0.506 u[IU]/mL (ref 0.350–4.500)

## 2019-07-29 LAB — GLUCOSE, CAPILLARY
Glucose-Capillary: 111 mg/dL — ABNORMAL HIGH (ref 70–99)
Glucose-Capillary: 114 mg/dL — ABNORMAL HIGH (ref 70–99)
Glucose-Capillary: 126 mg/dL — ABNORMAL HIGH (ref 70–99)
Glucose-Capillary: 127 mg/dL — ABNORMAL HIGH (ref 70–99)
Glucose-Capillary: 169 mg/dL — ABNORMAL HIGH (ref 70–99)

## 2019-07-29 LAB — CBC
HCT: 35.3 % — ABNORMAL LOW (ref 36.0–46.0)
Hemoglobin: 10.6 g/dL — ABNORMAL LOW (ref 12.0–15.0)
MCH: 26.1 pg (ref 26.0–34.0)
MCHC: 30 g/dL (ref 30.0–36.0)
MCV: 86.9 fL (ref 80.0–100.0)
Platelets: 223 10*3/uL (ref 150–400)
RBC: 4.06 MIL/uL (ref 3.87–5.11)
RDW: 16 % — ABNORMAL HIGH (ref 11.5–15.5)
WBC: 11.9 10*3/uL — ABNORMAL HIGH (ref 4.0–10.5)
nRBC: 0 % (ref 0.0–0.2)

## 2019-07-29 LAB — MAGNESIUM: Magnesium: 1.7 mg/dL (ref 1.7–2.4)

## 2019-07-29 LAB — D-DIMER, QUANTITATIVE: D-Dimer, Quant: 1.13 ug/mL-FEU — ABNORMAL HIGH (ref 0.00–0.50)

## 2019-07-29 MED ORDER — IOHEXOL 350 MG/ML SOLN
100.0000 mL | Freq: Once | INTRAVENOUS | Status: AC | PRN
  Administered 2019-07-29: 100 mL via INTRAVENOUS

## 2019-07-29 MED ORDER — SODIUM CHLORIDE 0.9 % IV SOLN
1.0000 g | INTRAVENOUS | Status: DC
  Administered 2019-07-30 – 2019-07-31 (×2): 1 g via INTRAVENOUS
  Filled 2019-07-29: qty 10
  Filled 2019-07-29: qty 1
  Filled 2019-07-29: qty 10

## 2019-07-29 MED ORDER — SODIUM CHLORIDE (PF) 0.9 % IJ SOLN
INTRAMUSCULAR | Status: AC
  Filled 2019-07-29: qty 50

## 2019-07-29 NOTE — Progress Notes (Signed)
RN attempted to call [name] for requested patient update. Voicemail left.

## 2019-07-29 NOTE — Plan of Care (Signed)
  Problem: Clinical Measurements: Goal: Ability to maintain clinical measurements within normal limits will improve Outcome: Progressing   Problem: Clinical Measurements: Goal: Will remain free from infection Outcome: Progressing   Problem: Coping: Goal: Level of anxiety will decrease Outcome: Progressing   Problem: Nutrition: Goal: Adequate nutrition will be maintained Outcome: Progressing   Problem: Safety: Goal: Ability to remain free from injury will improve Outcome: Progressing

## 2019-07-29 NOTE — Progress Notes (Signed)
PROGRESS NOTE    Lisa Villegas    Code Status: DNR  PF:[id number] DOB: 1930/05/20 DOA: 07/28/2019 LOS: 1 days  PCP: [name], MD CC: No chief complaint on file.      Hospital Summary   This is an 86 year old female with a history of autism spectrum disorder, CVA, HFpEF, type 2 diabetes, CKD 3, hypertension, prior Covid positive on 04/03/2019, hyperlipidemia who presented to the ED with hypoxia as well as generalized weakness and general inactivity as of recent.  CXR positive for RUL consolidation started on ceftriaxone and azithromycin for CAP therapy.  D-dimer found to be mildly elevated (1.1) with Wells score: 3 (HR > 100 at presentation, inactivity) CTA chest ordered which was negative for PE but did show extensive RUL opacity concerning for pneumonia as well as incidental right thyroid lobe mass measuring 1.8 x 1.6 cm.   A & P   Principal Problem:   Right upper lobe pneumonia Active Problems:   Type II diabetes mellitus with renal manifestations (HCC)   Hypertension   Hyperlipidemia   CKD (chronic kidney disease), stage III (HCC)   Chronic diastolic heart failure, NYHA class 2 (HCC)   History of CVA (cerebrovascular accident)   Acute respiratory failure with hypoxia ([place])   1. Acute hypoxic respiratory failure / sepsis secondary to RUL CAP a. Tachycardic, tachypneic, leukocytosis, hypoxic with elevated lactic acid (2.9) on admission, overall improved but WBC increased from 10.9> 11.9 b. Received LR bolus in ED c. SLP eval d. MRSA nares and procalcitonin negative e. CTA chest: Extensive airspace opacity consistent with RUL pneumonia arising in the posterior segment of RUL with much smaller areas noted in RML and inferior lingula with pleural effusions bilaterally and bibasilar atelectasis f. Incentive spirometry g. Day 2 ceftriaxone/azithromycin, continue IV antibiotics  2. Elevated D-dimer likely from underlying illness a. D-dimer found to be mildly elevated  (1.1) with Wells score: 3 (HR > 100 at presentation, inactivity) CTA chest ordered which was negative for PE   3. Incidental right thyroid lobe mass a. Partially calcified inhomogenous mass measuring 1.8 x 1.6 cm on CT scan b. TSH borderline low c. Would recommend dedicated studies and evaluation as an outpatient  4. CKD 3 a, stable  5. Type 2 diabetes  a. on sliding scale insulin b. Only home Metformin  6. Chronic diastolic heart failure not in overt acute exacerbation a. Mildly volume overloaded with effusions noted on CT b. Continue to follow I's/O and daily weights  7. Hypertension a. Continue home hydralazine and verapamil  8. Hyperlipidemia a. Continue atorvastatin  9. History of CVA a. On home aspirin and atorvastatin   DVT prophylaxis: Lovenox Family Communication: Patient's guardian was called and voicemail was left  Disposition Plan:  Status is: Inpatient  Remains inpatient appropriate because:IV treatments appropriate due to intensity of illness or inability to take PO   Dispo: The patient is from: ALF              Anticipated d/c is to: TBD              Anticipated d/c date is: 3 days              Patient currently is not medically stable to d/c.           Pressure injury documentation    None  Consultants  None  Procedures  None  Antibiotics   Anti-infectives (From admission, onward)   Start     Dose/Rate Route  Frequency Ordered Stop   07/30/19 1200  cefTRIAXone (ROCEPHIN) 1 g in sodium chloride 0.9 % 100 mL IVPB     1 g 200 mL/hr over 30 Minutes Intravenous Every 24 hours 07/29/19 1238 08/03/19 1159   07/29/19 0800  cefTRIAXone (ROCEPHIN) 2 g in sodium chloride 0.9 % 100 mL IVPB  Status:  Discontinued     2 g 200 mL/hr over 30 Minutes Intravenous Every 24 hours 07/28/19 2049 07/29/19 1238   07/28/19 2200  azithromycin (ZITHROMAX) 500 mg in sodium chloride 0.9 % 250 mL IVPB     500 mg 250 mL/hr over 60 Minutes Intravenous Every 24  hours 07/28/19 2049 08/02/19 2159   07/28/19 1915  vancomycin (VANCOCIN) IVPB 1000 mg/200 mL premix     1,000 mg 200 mL/hr over 60 Minutes Intravenous  Once 07/28/19 1910 07/28/19 2036   07/28/19 1915  ceFEPIme (MAXIPIME) 2 g in sodium chloride 0.9 % 100 mL IVPB     2 g 200 mL/hr over 30 Minutes Intravenous  Once 07/28/19 1910 07/28/19 1958        Subjective   Patient sleeping and resting, lying flat.  She was arousable to verbal stimuli but is a poor historian even her baseline autism and only providing one-word answers and falling back asleep.  Unable to obtain full ROS  Objective   Vitals:   07/29/19 0425 07/29/19 0901 07/29/19 0902 07/29/19 1320  BP: (!) 146/54 (!) 149/60 (!) 149/60 (!) 144/59  Pulse: 68  72 71  Resp: 18     Temp:   98.3 F (36.8 C) 98.5 F (36.9 C)  TempSrc:   Oral Oral  SpO2: 92%  99% 97%  Weight:      Height:        Intake/Output Summary (Last 24 hours) at 07/29/2019 1504 Last data filed at 07/29/2019 1327 Gross per 24 hour  Intake 672.82 ml  Output 200 ml  Net 472.82 ml   Filed Weights   07/28/19 1759 07/29/19 0425  Weight: 63 kg 63 kg    Examination:  Physical Exam Vitals and nursing note reviewed.  Constitutional:      Comments: Somnolent  HENT:     Head: Normocephalic.  Neck:     Comments: No thyromegaly Cardiovascular:     Rate and Rhythm: Normal rate and regular rhythm.     Heart sounds: No murmur.  Pulmonary:     Effort: No respiratory distress.     Breath sounds: No wheezing.     Comments: Poor effort Abdominal:     General: Abdomen is flat. There is no distension.  Musculoskeletal:        General: No swelling.  Skin:    Coloration: Skin is not pale.     Data Reviewed: I have personally reviewed following labs and imaging studies  CBC: Recent Labs  Lab 07/28/19 1828 07/29/19 0517  WBC 10.9* 11.9*  NEUTROABS 8.7*  --   HGB 11.9* 10.6*  HCT 39.4 35.3*  MCV 86.2 86.9  PLT 273 [id number]   Basic Metabolic  Panel: Recent Labs  Lab 07/28/19 1828 07/29/19 0517  NA 136 134*  K 4.6 4.0  CL 102 102  CO2 25 24  GLUCOSE 193* 145*  BUN 26* 20  CREATININE 1.00 0.75  CALCIUM 9.4 8.7*  MG  --  1.7   GFR: Estimated Creatinine Clearance: 47.3 mL/min (by C-G formula based on SCr of 0.75 mg/dL). Liver Function Tests: Recent Labs  Lab 07/28/19 1828  AST  23  ALT 26  ALKPHOS 83  BILITOT 0.6  PROT 7.1  ALBUMIN 3.8   No results for input(s): LIPASE, AMYLASE in the last 168 hours. No results for input(s): AMMONIA in the last 168 hours. Coagulation Profile: Recent Labs  Lab 07/28/19 1828  INR 1.1   Cardiac Enzymes: No results for input(s): CKTOTAL, CKMB, CKMBINDEX, TROPONINI in the last 168 hours. BNP (last 3 results) No results for input(s): PROBNP in the last 8760 hours. HbA1C: Recent Labs    07/28/19 1839  HGBA1C 8.1*   CBG: Recent Labs  Lab 07/28/19 2330 07/29/19 0741 07/29/19 1138  GLUCAP 169* 126* 127*   Lipid Profile: No results for input(s): CHOL, HDL, LDLCALC, TRIG, CHOLHDL, LDLDIRECT in the last 72 hours. Thyroid Function Tests: Recent Labs    07/29/19 1249  TSH 0.506   Anemia Panel: No results for input(s): VITAMINB12, FOLATE, FERRITIN, TIBC, IRON, RETICCTPCT in the last 72 hours. Sepsis Labs: Recent Labs  Lab 07/28/19 1828 07/28/19 1924 07/28/19 2100  PROCALCITON  --   --  <0.10  LATICACIDVEN 2.9* 2.4*  --     Recent Results (from the past 240 hour(s))  Blood Culture (routine x 2)     Status: None (Preliminary result)   Collection Time: 07/28/19  5:59 PM   Specimen: BLOOD  Result Value Ref Range Status   Specimen Description   Final    BLOOD RIGHT ARM Performed at [facility] [address] [place], [place] [address]    Special Requests   Final    BOTTLES DRAWN AEROBIC AND ANAEROBIC Blood Culture adequate volume Performed at [facility] [address], [place], [place] [address]    Culture   Final     NO GROWTH < 12 HOURS Performed at [facility] [address], [place], [place] [address]    Report Status PENDING  Incomplete  Blood Culture (routine x 2)     Status: None (Preliminary result)   Collection Time: 07/28/19  6:28 PM   Specimen: BLOOD  Result Value Ref Range Status   Specimen Description   Final    BLOOD RIGHT WRIST Performed at [facility] [address], [place], [place] [address]    Special Requests   Final    BOTTLES DRAWN AEROBIC ONLY Blood Culture results may not be optimal due to an inadequate volume of blood received in culture bottles Performed at [facility] [address] [place], [place] [address]    Culture   Final    NO GROWTH < 12 HOURS Performed at [facility] [address] [place], [place] [address]    Report Status PENDING  Incomplete  Respiratory Panel by RT PCR (Flu A&B, Covid) - Nasopharyngeal Swab     Status: None   Collection Time: 07/28/19  6:57 PM   Specimen: Nasopharyngeal Swab  Result Value Ref Range Status   SARS Coronavirus 2 by RT PCR NEGATIVE NEGATIVE Final    Comment: (NOTE) SARS-CoV-2 target nucleic acids are NOT DETECTED. The SARS-CoV-2 RNA is generally detectable in upper respiratoy specimens during the acute phase of infection. The lowest concentration of SARS-CoV-2 viral copies this assay can detect is 131 copies/mL. A negative result does not preclude SARS-Cov-2 infection and should not be used as the sole basis for treatment or other patient management decisions. A negative result may occur with  improper specimen collection/handling, submission of specimen other than nasopharyngeal swab, presence of  viral mutation(s) within the areas targeted by this assay, and inadequate number of viral copies (<131 copies/mL). A negative result must be combined with clinical observations, patient history, and epidemiological information. The expected result is Negative. Fact  Sheet for Patients:  PinkCheek.be Fact Sheet for Healthcare Providers:  GravelBags.it This test is not yet ap proved or cleared by the [place] FDA and  has been authorized for detection and/or diagnosis of SARS-CoV-2 by FDA under an Emergency Use Authorization (EUA). This EUA will remain  in effect (meaning this test can be used) for the duration of the COVID-19 declaration under Section 564(b)(1) of the Act, 21 U.S.C. section 360bbb-3(b)(1), unless the authorization is terminated or revoked sooner.    Influenza A by PCR NEGATIVE NEGATIVE Final   Influenza B by PCR NEGATIVE NEGATIVE Final    Comment: (NOTE) The Xpert Xpress SARS-CoV-2/FLU/RSV assay is intended as an aid in  the diagnosis of influenza from Nasopharyngeal swab specimens and  should not be used as a sole basis for treatment. Nasal washings and  aspirates are unacceptable for Xpert Xpress SARS-CoV-2/FLU/RSV  testing. Fact Sheet for Patients: PinkCheek.be Fact Sheet for Healthcare Providers: GravelBags.it This test is not yet approved or cleared by the [place] FDA and  has been authorized for detection and/or diagnosis of SARS-CoV-2 by  FDA under an Emergency Use Authorization (EUA). This EUA will remain  in effect (meaning this test can be used) for the duration of the  Covid-19 declaration under Section 564(b)(1) of the Act, 21  U.S.C. section 360bbb-3(b)(1), unless the authorization is  terminated or revoked. Performed at [facility], [place] [address] [place], [place] [address]   MRSA PCR Screening     Status: None   Collection Time: 07/29/19  9:46 AM   Specimen: Nasopharyngeal  Result Value Ref Range Status   MRSA by PCR NEGATIVE NEGATIVE Final    Comment:        The GeneXpert MRSA Assay (FDA approved for NASAL specimens only), is one component of a comprehensive  MRSA colonization surveillance program. It is not intended to diagnose MRSA infection nor to guide or monitor treatment for MRSA infections. Performed at [facility], [place] [address], [place], [place] [address]          Radiology Studies: CT ANGIO CHEST PE W OR WO CONTRAST  Result Date: 07/29/2019 CLINICAL DATA:  Hypoxia and elevated D-dimer EXAM: CT ANGIOGRAPHY CHEST WITH CONTRAST TECHNIQUE: Multidetector CT imaging of the chest was performed using the standard protocol during bolus administration of intravenous contrast. Multiplanar CT image reconstructions and MIPs were obtained to evaluate the vascular anatomy. CONTRAST:  173mL OMNIPAQUE IOHEXOL 350 MG/ML SOLN COMPARISON:  Chest radiograph Jul 28, 2019 FINDINGS: Cardiovascular: No demonstrable pulmonary embolus. There is no appreciable thoracic aortic aneurysm or dissection. There are scattered foci of calcification in visualized great vessels. There are foci of aortic atherosclerosis. There are foci of coronary artery calcification. There is left ventricular hypertrophy. Minimal pericardial effusion may be within physiologic range. There is no pericardial thickening. Mediastinum/Nodes: There is an inhomogeneous partially calcified mass right lobe of the thyroid measuring 1.8 x 1.6 cm. Thyroid otherwise appears normal. There is no appreciable thoracic adenopathy. There is a fairly sizable hiatal type hernia. Lungs/Pleura: There is airspace consolidation in the right upper lobe with primary involvement in the posterior segment of the right upper lobe but with infiltrate as well in portions of the anterior and apical segments of the right upper  lobe. There are bilateral pleural effusions, fairly small, with atelectatic change in both lower lung regions. A small area of infiltrate is noted in the medial segment of the right middle lobe. A second smaller of infiltrate is noted in the inferior lingula. Upper Abdomen: There is again  noted an apparent foramen of Morgagni hernia on the right with liver extending into this region, stable. There is aortic atherosclerosis in the upper abdomen as well as foci of mesenteric arterial vascular calcification. Visualized upper abdominal structures otherwise appear unremarkable. Musculoskeletal: There is degenerative change in the thoracic spine with mild anterior wedging of several lower thoracic vertebral bodies. There is a degree of underlying osteoporosis. No blastic or lytic bone lesions. No chest wall lesions are evident. Review of the MIP images confirms the above findings. IMPRESSION: 1. No demonstrable pulmonary embolus. No thoracic aortic aneurysm or dissection. There are foci of aortic atherosclerosis as well as foci of great vessel and coronary artery calcification. 2. Fairly extensive airspace opacity consistent with pneumonia in the right upper lobe, primarily but not exclusively arising in the posterior segment right upper lobe. Much smaller areas of infiltrate noted in the right middle lobe and inferior lingula. Pleural effusions bilaterally with bibasilar compressive atelectasis. Mild superimposed pneumonia in these areas cannot be excluded. 3.  No evident adenopathy. 4. Right lobe thyroid mass measuring 1.8 x 1.6 cm, inhomogeneous in appearance and partially calcified. In the setting of significant comorbidities or limited life expectancy, no follow-up recommended unless clinical situation warrants otherwise. (Ref: J Am Coll Radiol. 2015 Feb;12(2): 143-50). 5.  Fairly sizable hiatal hernia. 6. 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 Aortic Atherosclerosis (ICD10-I70.0). Electronically Signed   By: Lowella Grip III M.D.   On: 07/29/2019 10:55   DG Chest Port 1 View  Result Date: 07/28/2019 CLINICAL DATA:  Weakness EXAM: PORTABLE CHEST 1 VIEW COMPARISON:  03/30/2019 FINDINGS: Large area of consolidation in the right upper lobe. Lungs are otherwise  clear. No pneumothorax or pleural effusion. Mild cardiomegaly with calcific aortic atherosclerosis. IMPRESSION: Right upper lobe consolidation, likely pneumonia. Electronically Signed   By: Ulyses Jarred M.D.   On: 07/28/2019 19:02        Scheduled Meds:  aspirin EC  81 mg Oral Daily   atorvastatin  20 mg Oral q1800   enoxaparin (LOVENOX) injection  40 mg Subcutaneous Q24H   fesoterodine  8 mg Oral Daily   hydrALAZINE  10 mg Oral TID   insulin aspart  0-9 Units Subcutaneous TID WC   pantoprazole  40 mg Oral Daily   verapamil  120 mg Oral QHS   Continuous Infusions:  azithromycin 500 mg (07/28/19 2336)   [START ON 07/30/2019] cefTRIAXone (ROCEPHIN)  IV       Time spent: 30 minutes with over 50% of the time coordinating the patient's care    Harold Hedge, DO [facility] Pager [phone]  Call night coverage person covering after 7pm

## 2019-07-30 LAB — CBC
HCT: 32.6 % — ABNORMAL LOW (ref 36.0–46.0)
Hemoglobin: 9.9 g/dL — ABNORMAL LOW (ref 12.0–15.0)
MCH: 25.8 pg — ABNORMAL LOW (ref 26.0–34.0)
MCHC: 30.4 g/dL (ref 30.0–36.0)
MCV: 85.1 fL (ref 80.0–100.0)
Platelets: 223 10*3/uL (ref 150–400)
RBC: 3.83 MIL/uL — ABNORMAL LOW (ref 3.87–5.11)
RDW: 16 % — ABNORMAL HIGH (ref 11.5–15.5)
WBC: 9.5 10*3/uL (ref 4.0–10.5)
nRBC: 0 % (ref 0.0–0.2)

## 2019-07-30 LAB — BASIC METABOLIC PANEL
Anion gap: 9 (ref 5–15)
BUN: 22 mg/dL (ref 8–23)
CO2: 23 mmol/L (ref 22–32)
Calcium: 8.6 mg/dL — ABNORMAL LOW (ref 8.9–10.3)
Chloride: 103 mmol/L (ref 98–111)
Creatinine, Ser: 0.95 mg/dL (ref 0.44–1.00)
GFR calc Af Amer: >60 mL/min (ref >60)
GFR calc non Af Amer: 54 mL/min — ABNORMAL LOW (ref >60)
Glucose, Bld: 103 mg/dL — ABNORMAL HIGH (ref 70–99)
Potassium: 4 mmol/L (ref 3.5–5.1)
Sodium: 135 mmol/L (ref 135–145)

## 2019-07-30 LAB — GLUCOSE, CAPILLARY
Glucose-Capillary: 116 mg/dL — ABNORMAL HIGH (ref 70–99)
Glucose-Capillary: 241 mg/dL — ABNORMAL HIGH (ref 70–99)
Glucose-Capillary: 145 mg/dL — ABNORMAL HIGH (ref 70–99)
Glucose-Capillary: 175 mg/dL — ABNORMAL HIGH (ref 70–99)

## 2019-07-30 LAB — LEGIONELLA PNEUMOPHILA SEROGP 1 UR AG: L. pneumophila Serogp 1 Ur Ag: NEGATIVE

## 2019-07-30 NOTE — Evaluation (Signed)
Clinical/Bedside Swallow Evaluation Patient Details  Name: Lisa Villegas MRN: [id number] Date of Birth: [birth year]  Today's Date: 07/30/2019 Time: SLP Start Time (ACUTE ONLY): 0932 SLP Stop Time (ACUTE ONLY): 1005 SLP Time Calculation (min) (ACUTE ONLY): 33 min  Past Medical History:  Past Medical History:  Diagnosis Date  . Anemia   . Anxiety and depression    per med rec  . 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 Autism   . Chronic CHF (congestive heart failure) (HCC)    per med rec  . Chronic kidney disease (CKD)    stage 3 per chart in 09/2015  . Diabetes mellitus without complication ([place])   . Diabetic neuropathy ([place])   . History of thyroid nodule    nontoxic goiter per med rec  . Hodgkin disease ([place])    in 76  . HTN (hypertension), benign   . Hyperlipidemia   . Incontinence   . Lack of sensation   . Osteopenia    per med rec  . Poor balance   . Stroke ([place])   . Tubular adenoma of colon 01/2017  . Vitamin D deficiency    Past Surgical History:  Past Surgical History:  Procedure Laterality Date  . ABDOMINAL HYSTERECTOMY    . BREAST LUMPECTOMY Left    HPI:  86 yo female adm to [facility] with respiratory deficits from Morning view and found to have right lung opacities. CT chest showed extensive RUL airspace opacity and smaller ares of infiltration.  Pt with h/o austism, COVID Jan 2021, right thyroid mass, osteophytosis, and multiple cervical disc degenerative dx.  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  Swallow eval ordered due to concern for pt possible aspirating.  Pt denies dysphagia.  Per imaging study, she does have h/o concerrns for left mca neurological involvement. RN reports pt tolerating po well today - and taking medications with thin but she does require cues at times.   Assessment / Plan / Recommendation Clinical Impression  Pt presents with functional oropharyngeal swallow ability. She is able to feed herself cheerios, graham  crackers and 2 small boluses of water.  Pt does eat at a rapid rate with minimal oral pocketing on left - but she was able to eventually orally clear independently.  Minimal facial asymmetry on left and obvious decreased sensation evidenced by pocketing presumed due to facial and trigeminal nerve involvement.  SLP is concerned pt's fluid intake may be inadequate as she only took a few small sips and declined further liquid offerings. SLP provided her with written compensation strategies to mitigate aspiration risk having her read them and using teach back.  Of note, pt denies dysphagia as SLP inquired due to her thyroid mass and cervical spine DDD.  No SLP follow up indicated.  Thanks for this consult.  SLP Visit Diagnosis: Dysphagia, oral phase (R13.11)    Aspiration Risk  Mild aspiration risk    Diet Recommendation Regular;Thin liquid   Liquid Administration via: Cup;Straw Medication Administration: Whole meds with liquid Supervision: Patient able to self feed;Intermittent supervision to cue for compensatory strategies Compensations: Slow rate;Small sips/bites Postural Changes: Seated upright at 90 degrees;Remain upright for at least 30 minutes after po intake    Other  Recommendations Oral Care Recommendations: Oral care BID   Follow up Recommendations None      Frequency and Duration            Prognosis        Swallow Study  General Date of Onset: 07/30/19 HPI: 86 yo female adm to [facility] with respiratory deficits from Morning view and found to have right lung opacities. CT chest showed extensive RUL airspace opacity and smaller ares of infiltration.  Pt with h/o austism, COVID Jan 2021, right thyroid maxx, osteophytosis, and multiple cervical disc degenerative dx.  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  Swallow eval ordered due to concern for pt possible aspirating.  Pt denies dysphagia.  Per imaging study, she does have h/o concerrns for left  mca neurological involvement. RN reports pt tolerating po well today - and taking medications with thin but she does require cues at times. Type of Study: Bedside Swallow Evaluation Diet Prior to this Study: Regular;Thin liquids Temperature Spikes Noted: No Respiratory Status: Nasal cannula History of Recent Intubation: No Behavior/Cognition: Alert;Cooperative;Other (Comment)(able to mimic better than follow directions) Oral Cavity Assessment: Within Functional Limits Oral Care Completed by SLP: No Oral Cavity - Dentition: Adequate natural dentition Vision: Functional for self-feeding Self-Feeding Abilities: Able to feed self Patient Positioning: Upright in bed Baseline Vocal Quality: Normal Volitional Cough: Strong Volitional Swallow: Able to elicit    Oral/Motor/Sensory Function Overall Oral Motor/Sensory Function: Mild impairment Facial ROM: Within Functional Limits Facial Symmetry: Abnormal symmetry left Facial Strength: Reduced left Facial Sensation: (dnt) Lingual ROM: Within Functional Limits Lingual Symmetry: Within Functional Limits Lingual Strength: Within Functional Limits Lingual Sensation: Within Functional Limits Velum: Within Functional Limits Mandible: (dnt)   Ice Chips Ice chips: Not tested   Thin Liquid Other Comments: pt did not pass the 3 ounce yale water test with two trials due to stopping intake, she consumed only a minimal amount of liquids    Nectar Thick Nectar Thick Liquid: Not tested   Honey Thick Honey Thick Liquid: Not tested   Puree Puree: Not tested   Solid     Solid: Within functional limits Presentation: Self Fed;Spoon Other Comments: pt with minimal oral pocketing on left and rapid rate of intake, but she effectively clears oral cavity eventually      Macario Golds 07/30/2019,10:35 AM

## 2019-07-30 NOTE — Plan of Care (Signed)
  Problem: Clinical Measurements: Goal: Respiratory complications will improve Outcome: Progressing   Problem: Nutrition: Goal: Adequate nutrition will be maintained Outcome: Progressing   Problem: Elimination: Goal: Will not experience complications related to urinary retention Outcome: Progressing   

## 2019-07-30 NOTE — Progress Notes (Signed)
PROGRESS NOTE    Lisa Villegas    Code Status: DNR  PF:[id number] DOB: 1930-05-28 DOA: 07/28/2019 LOS: 2 days  PCP: [name], MD CC: No chief complaint on file.      Hospital Summary   This is an 86 year old female with a history of autism spectrum disorder, CVA, HFpEF, type 2 diabetes, CKD 3, hypertension, prior Covid positive on 04/03/2019, hyperlipidemia who presented to the ED with hypoxia as well as generalized weakness and general inactivity as of recent.  CXR positive for RUL consolidation started on ceftriaxone and azithromycin for CAP therapy.  D-dimer found to be mildly elevated (1.1) with Wells score: 3 (HR > 100 at presentation, inactivity) CTA chest ordered which was negative for PE but did show extensive RUL opacity concerning for pneumonia as well as incidental right thyroid lobe mass measuring 1.8 x 1.6 cm.   A & P   Principal Problem:   Right upper lobe pneumonia Active Problems:   Type II diabetes mellitus with renal manifestations (HCC)   Hypertension   Hyperlipidemia   CKD (chronic kidney disease), stage III (HCC)   Chronic diastolic heart failure, NYHA class 2 (HCC)   History of CVA (cerebrovascular accident)   Acute respiratory failure with hypoxia ([place])   1. Acute hypoxic respiratory failure / sepsis secondary to RUL CAP a. Sepsis has resolved, still requiring O2 b. SLP eval: no follow-up needed c. MRSA nares and procalcitonin negative d. CTA chest: Extensive airspace opacity consistent with RUL pneumonia arising in the posterior segment of RUL with much smaller areas noted in RML and inferior lingula with pleural effusions bilaterally and bibasilar atelectasis e. Incentive spirometry f. Day 3 ceftriaxone/azithromycin, continue IV antibiotics g. Hopefully can discharge in the next 24-48 hours pending O2 requirements  2. Elevated D-dimer likely from underlying illness a. D-dimer found to be mildly elevated (1.1) with Wells score: 3 (HR > 100  at presentation, inactivity) CTA chest ordered which was negative for PE   3. Incidental right thyroid lobe mass a. Partially calcified inhomogenous mass measuring 1.8 x 1.6 cm on CT scan b. TSH borderline low c. Would recommend dedicated studies and evaluation as an outpatient  4. CKD 3 a, stable  5. Type 2 diabetes  a. on sliding scale insulin b. Holding home Metformin  6. Chronic diastolic heart failure not in overt acute exacerbation a. Mildly volume overloaded with effusions noted on CT b. Weight stable c. Continue to follow I's/O and daily weights  7. Hypertension a. Continue home hydralazine and verapamil  8. Hyperlipidemia a. Continue atorvastatin  9. History of CVA a. On home aspirin and atorvastatin   DVT prophylaxis: Lovenox Family Communication: Patient's guardian was called and voicemail was left 5/3 Disposition Plan: can likely DC in next 24-48 hours pending O2 requirements and if ALF can accommodate for PT Status is: Inpatient  Remains inpatient appropriate because:IV treatments appropriate due to intensity of illness or inability to take PO   Dispo: The patient is from: ALF              Anticipated d/c is to: TBD              Anticipated d/c date is: 2 days              Patient currently is not medically stable to d/c.           Pressure injury documentation    None  Consultants  None  Procedures  None  Antibiotics  Anti-infectives (From admission, onward)   Start     Dose/Rate Route Frequency Ordered Stop   07/30/19 1200  cefTRIAXone (ROCEPHIN) 1 g in sodium chloride 0.9 % 100 mL IVPB     1 g 200 mL/hr over 30 Minutes Intravenous Every 24 hours 07/29/19 1238 08/03/19 1159   07/29/19 0800  cefTRIAXone (ROCEPHIN) 2 g in sodium chloride 0.9 % 100 mL IVPB  Status:  Discontinued     2 g 200 mL/hr over 30 Minutes Intravenous Every 24 hours 07/28/19 2049 07/29/19 1238   07/28/19 2200  azithromycin (ZITHROMAX) 500 mg in sodium chloride  0.9 % 250 mL IVPB     500 mg 250 mL/hr over 60 Minutes Intravenous Every 24 hours 07/28/19 2049 07/30/19 1647   07/28/19 1915  vancomycin (VANCOCIN) IVPB 1000 mg/200 mL premix     1,000 mg 200 mL/hr over 60 Minutes Intravenous  Once 07/28/19 1910 07/28/19 2036   07/28/19 1915  ceFEPIme (MAXIPIME) 2 g in sodium chloride 0.9 % 100 mL IVPB     2 g 200 mL/hr over 30 Minutes Intravenous  Once 07/28/19 1910 07/28/19 1958        Subjective   Seems much better today. Patient is awake and answers one word answers due to autism. No overnight events, no complaints  Objective   Vitals:   07/29/19 1320 07/29/19 2216 07/30/19 0534 07/30/19 1439  BP: (!) 144/59 (!) 156/74 (!) 119/51 126/63  Pulse: 71 79 65 76  Resp:  18 18 17   Temp: 98.5 F (36.9 C) 99 F (37.2 C) 99.5 F (37.5 C) 98.8 F (37.1 C)  TempSrc: Oral Oral Oral Oral  SpO2: 97% 96% 92% 95%  Weight:      Height:        Intake/Output Summary (Last 24 hours) at 07/30/2019 1641 Last data filed at 07/30/2019 1438 Gross per 24 hour  Intake 780 ml  Output 1000 ml  Net -220 ml   Filed Weights   07/28/19 1759 07/29/19 0425  Weight: 63 kg 63 kg    Examination:  Physical Exam Vitals and nursing note reviewed.  Constitutional:      General: She is not in acute distress. HENT:     Head: Normocephalic.     Mouth/Throat:     Mouth: Mucous membranes are moist.  Eyes:     Conjunctiva/sclera: Conjunctivae normal.  Cardiovascular:     Rate and Rhythm: Normal rate and regular rhythm.  Pulmonary:     Effort: No respiratory distress.     Comments: Poor effort On 2L/min Abdominal:     General: There is no distension.  Musculoskeletal:        General: No swelling or tenderness.  Neurological:     Mental Status: She is alert. Mental status is at baseline.     Data Reviewed: I have personally reviewed following labs and imaging studies  CBC: Recent Labs  Lab 07/28/19 1828 07/29/19 0517 07/30/19 0438  WBC 10.9* 11.9*  9.5  NEUTROABS 8.7*  --   --   HGB 11.9* 10.6* 9.9*  HCT 39.4 35.3* 32.6*  MCV 86.2 86.9 85.1  PLT 273 223 [id number]   Basic Metabolic Panel: Recent Labs  Lab 07/28/19 1828 07/29/19 0517 07/30/19 0438  NA 136 134* 135  K 4.6 4.0 4.0  CL 102 102 103  CO2 25 24 23   GLUCOSE 193* 145* 103*  BUN 26* 20 22  CREATININE 1.00 0.75 0.95  CALCIUM 9.4 8.7* 8.6*  MG  --  1.7  --    GFR: Estimated Creatinine Clearance: 39.8 mL/min (by C-G formula based on SCr of 0.95 mg/dL). Liver Function Tests: Recent Labs  Lab 07/28/19 1828  AST 23  ALT 26  ALKPHOS 83  BILITOT 0.6  PROT 7.1  ALBUMIN 3.8   No results for input(s): LIPASE, AMYLASE in the last 168 hours. No results for input(s): AMMONIA in the last 168 hours. Coagulation Profile: Recent Labs  Lab 07/28/19 1828  INR 1.1   Cardiac Enzymes: No results for input(s): CKTOTAL, CKMB, CKMBINDEX, TROPONINI in the last 168 hours. BNP (last 3 results) No results for input(s): PROBNP in the last 8760 hours. HbA1C: Recent Labs    07/28/19 1839  HGBA1C 8.1*   CBG: Recent Labs  Lab 07/29/19 1138 07/29/19 1638 07/29/19 2120 07/30/19 0726 07/30/19 1144  GLUCAP 127* 111* 114* 116* 241*   Lipid Profile: No results for input(s): CHOL, HDL, LDLCALC, TRIG, CHOLHDL, LDLDIRECT in the last 72 hours. Thyroid Function Tests: Recent Labs    07/29/19 1249  TSH 0.506   Anemia Panel: No results for input(s): VITAMINB12, FOLATE, FERRITIN, TIBC, IRON, RETICCTPCT in the last 72 hours. Sepsis Labs: Recent Labs  Lab 07/28/19 1828 07/28/19 1924 07/28/19 2100  PROCALCITON  --   --  <0.10  LATICACIDVEN 2.9* 2.4*  --     Recent Results (from the past 240 hour(s))  Blood Culture (routine x 2)     Status: None (Preliminary result)   Collection Time: 07/28/19  5:59 PM   Specimen: BLOOD  Result Value Ref Range Status   Specimen Description   Final    BLOOD RIGHT ARM Performed at [facility] [address],  [place], [place] [address]    Special Requests   Final    BOTTLES DRAWN AEROBIC AND ANAEROBIC Blood Culture adequate volume Performed at [facility] [address], [place], [place] [address]    Culture   Final    NO GROWTH 2 DAYS Performed at [facility] [address], [place], [place] [address]    Report Status PENDING  Incomplete  Blood Culture (routine x 2)     Status: None (Preliminary result)   Collection Time: 07/28/19  6:28 PM   Specimen: BLOOD  Result Value Ref Range Status   Specimen Description   Final    BLOOD RIGHT WRIST Performed at [facility] [address] [place], [place] [address]    Special Requests   Final    BOTTLES DRAWN AEROBIC ONLY Blood Culture results may not be optimal due to an inadequate volume of blood received in culture bottles Performed at [facility] [address], [place], [place] [address]    Culture   Final    NO GROWTH 2 DAYS Performed at [facility] [address], [place], [place] [address]    Report Status PENDING  Incomplete  Urine culture     Status: None   Collection Time: 07/28/19  6:28 PM   Specimen: In/Out Cath Urine  Result Value Ref Range Status   Specimen Description   Final    IN/OUT CATH URINE Performed at [facility] [address] [place], [place] [address]    Special Requests   Final    NONE Performed at [facility], [place] [address], [place], [place] [address]    Culture   Final    NO GROWTH Performed at [facility], [address], [place], [place] [address]    Report Status 07/29/2019 FINAL  Final  Respiratory Panel by RT PCR (Flu A&B, Covid) - Nasopharyngeal Swab     Status: None   Collection Time: 07/28/19  6:57 PM   Specimen: Nasopharyngeal Swab  Result Value Ref Range Status   SARS Coronavirus 2 by RT PCR NEGATIVE NEGATIVE Final    Comment: (NOTE) SARS-CoV-2 target nucleic acids are  NOT DETECTED. The SARS-CoV-2 RNA is generally detectable in upper respiratoy specimens during the acute phase of infection. The lowest concentration of SARS-CoV-2 viral copies this assay can detect is 131 copies/mL. A negative result does not preclude SARS-Cov-2 infection and should not be used as the sole basis for treatment or other patient management decisions. A negative result may occur with  improper specimen collection/handling, submission of specimen other than nasopharyngeal swab, presence of viral mutation(s) within the areas targeted by this assay, and inadequate number of viral copies (<131 copies/mL). A negative result must be combined with clinical observations, patient history, and epidemiological information. The expected result is Negative. Fact Sheet for Patients:  PinkCheek.be Fact Sheet for Healthcare Providers:  GravelBags.it This test is not yet ap proved or cleared by the [place] FDA and  has been authorized for detection and/or diagnosis of SARS-CoV-2 by FDA under an Emergency Use Authorization (EUA). This EUA will remain  in effect (meaning this test can be used) for the duration of the COVID-19 declaration under Section 564(b)(1) of the Act, 21 U.S.C. section 360bbb-3(b)(1), unless the authorization is terminated or revoked sooner.    Influenza A by PCR NEGATIVE NEGATIVE Final   Influenza B by PCR NEGATIVE NEGATIVE Final    Comment: (NOTE) The Xpert Xpress SARS-CoV-2/FLU/RSV assay is intended as an aid in  the diagnosis of influenza from Nasopharyngeal swab specimens and  should not be used as a sole basis for treatment. Nasal washings and  aspirates are unacceptable for Xpert Xpress SARS-CoV-2/FLU/RSV  testing. Fact Sheet for Patients: PinkCheek.be Fact Sheet for Healthcare Providers: GravelBags.it This test is not yet approved or  cleared by the [place] FDA and  has been authorized for detection and/or diagnosis of SARS-CoV-2 by  FDA under an Emergency Use Authorization (EUA). This EUA will remain  in effect (meaning this test can be used) for the duration of the  Covid-19 declaration under Section 564(b)(1) of the Act, 21  U.S.C. section 360bbb-3(b)(1), unless the authorization is  terminated or revoked. Performed at [facility], [place] [address] [place], [place] [address]   MRSA PCR Screening     Status: None   Collection Time: 07/29/19  9:46 AM   Specimen: Nasopharyngeal  Result Value Ref Range Status   MRSA by PCR NEGATIVE NEGATIVE Final    Comment:        The GeneXpert MRSA Assay (FDA approved for NASAL specimens only), is one component of a comprehensive MRSA colonization surveillance program. It is not intended to diagnose MRSA infection nor to guide or monitor treatment for MRSA infections. Performed at [facility], [place] [address], [place], [place] [address]          Radiology Studies: CT ANGIO CHEST PE W OR WO CONTRAST  Result Date: 07/29/2019 CLINICAL DATA:  Hypoxia and elevated D-dimer EXAM: CT ANGIOGRAPHY CHEST WITH CONTRAST TECHNIQUE: Multidetector CT imaging of the chest was performed using the standard protocol during bolus administration of intravenous contrast. Multiplanar CT image reconstructions and MIPs were obtained to  evaluate the vascular anatomy. CONTRAST:  147mL OMNIPAQUE IOHEXOL 350 MG/ML SOLN COMPARISON:  Chest radiograph Jul 28, 2019 FINDINGS: Cardiovascular: No demonstrable pulmonary embolus. There is no appreciable thoracic aortic aneurysm or dissection. There are scattered foci of calcification in visualized great vessels. There are foci of aortic atherosclerosis. There are foci of coronary artery calcification. There is left ventricular hypertrophy. Minimal pericardial effusion may be within physiologic range. There is no pericardial  thickening. Mediastinum/Nodes: There is an inhomogeneous partially calcified mass right lobe of the thyroid measuring 1.8 x 1.6 cm. Thyroid otherwise appears normal. There is no appreciable thoracic adenopathy. There is a fairly sizable hiatal type hernia. Lungs/Pleura: There is airspace consolidation in the right upper lobe with primary involvement in the posterior segment of the right upper lobe but with infiltrate as well in portions of the anterior and apical segments of the right upper lobe. There are bilateral pleural effusions, fairly small, with atelectatic change in both lower lung regions. A small area of infiltrate is noted in the medial segment of the right middle lobe. A second smaller of infiltrate is noted in the inferior lingula. 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 There is aortic atherosclerosis in the upper abdomen as well as foci of mesenteric arterial vascular calcification. Visualized upper abdominal structures otherwise appear unremarkable. Musculoskeletal: There is degenerative change in the thoracic spine with mild anterior wedging of several lower thoracic vertebral bodies. There is a degree of underlying osteoporosis. No blastic or lytic bone lesions. No chest wall lesions are evident. Review of the MIP images confirms the above findings. IMPRESSION: 1. No demonstrable pulmonary embolus. No thoracic aortic aneurysm or dissection. There are foci of aortic atherosclerosis as well as foci of great vessel and coronary artery calcification. 2. Fairly extensive airspace opacity consistent with pneumonia in the right upper lobe, primarily but not exclusively arising in the posterior segment right upper lobe. Much smaller areas of infiltrate noted in the right middle lobe and inferior lingula. Pleural effusions bilaterally with bibasilar compressive atelectasis. Mild superimposed pneumonia in these  areas cannot be excluded. 3.  No evident adenopathy. 4. Right lobe thyroid mass measuring 1.8 x 1.6 cm, inhomogeneous in appearance and partially calcified. In the setting of significant comorbidities or limited life expectancy, no follow-up recommended unless clinical situation warrants otherwise. (Ref: J Am Coll Radiol. 2015 Feb;12(2): 143-50). 5.  Fairly sizable hiatal hernia. 6. 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 Aortic Atherosclerosis (ICD10-I70.0). Electronically Signed   By: Lowella Grip III M.D.   On: 07/29/2019 10:55   DG Chest Port 1 View  Result Date: 07/28/2019 CLINICAL DATA:  Weakness EXAM: PORTABLE CHEST 1 VIEW COMPARISON:  03/30/2019 FINDINGS: Large area of consolidation in the right upper lobe. Lungs are otherwise clear. No pneumothorax or pleural effusion. Mild cardiomegaly with calcific aortic atherosclerosis. IMPRESSION: Right upper lobe consolidation, likely pneumonia. Electronically Signed   By: Ulyses Jarred M.D.   On: 07/28/2019 19:02        Scheduled Meds: . aspirin EC  81 mg Oral Daily  . atorvastatin  20 mg Oral q1800  . enoxaparin (LOVENOX) injection  40 mg Subcutaneous Q24H  . fesoterodine  8 mg Oral Daily  . hydrALAZINE  10 mg Oral TID  . insulin aspart  0-9 Units Subcutaneous TID WC  . pantoprazole  40 mg Oral Daily  . verapamil  120 mg Oral  QHS   Continuous Infusions: . [COMPLETED] azithromycin 500 mg (07/29/19 2320)  . cefTRIAXone (ROCEPHIN)  IV 1 g (07/30/19 1054)     Time spent: 25 minutes with over 50% of the time coordinating the patient's care    Harold Hedge, DO [facility] Pager [phone]  Call night coverage person covering after 7pm

## 2019-07-30 NOTE — Evaluation (Signed)
Physical Therapy One Time Evaluation Patient Details Name: Lisa Villegas MRN: [id number] DOB: 1930/04/08 Today's Date: 07/30/2019   History of Present Illness  86 year old female with a history of autism spectrum disorder, CVA, HFpEF, type 2 diabetes, CKD 3, hypertension, prior Covid positive on 04/03/2019, hyperlipidemia who presented to the ED with hypoxia as well as generalized weakness and general inactivity as of recent.  CXR positive for RUL consolidation started on ceftriaxone and azithromycin for CAP therapy.  D-dimer found to be mildly elevated (1.1) with Wells score: 3 (HR > 100 at presentation, inactivity) CTA chest ordered which was negative for PE but did show extensive RUL opacity concerning for pneumonia as well as incidental right thyroid lobe mass measuring 1.8 x 1.6 cm.  Clinical Impression  Patient evaluated by Physical Therapy with no further acute PT needs identified. All education has been completed and the patient has no further questions.  Pt eventually able sit EOB.  Pt does appear to perform and answer questions at her own will.  Pt not agreeable to standing or further mobility today however did bed mobility with supervision.  Will defer PT needs to ALF, and if pt will participate. PT is signing off. Thank you for this referral.     Follow Up Recommendations Other (comment);Home health PT(per ALF, if pt will participate)    Equipment Recommendations  None recommended by PT    Recommendations for Other Services       Precautions / Restrictions Precautions Precautions: Fall      Mobility  Bed Mobility Overal bed mobility: Needs Assistance Bed Mobility: Supine to Sit;Sit to Supine     Supine to sit: Supervision;HOB elevated Sit to supine: Supervision;HOB elevated   General bed mobility comments: pt required some encouragement however to eventually sit EOB briefly and able to return to bed without assist  Transfers                 General transfer  comment: pt declined  Ambulation/Gait                Stairs            Wheelchair Mobility    Modified Rankin (Stroke Patients Only)       Balance                                             Pertinent Vitals/Pain Pain Assessment: No/denies pain    Home Living Family/patient expects to be discharged to:: Assisted living               Home Equipment: Gilford Rile - 2 wheels Additional Comments: from admission in January: Per RN pt from ALF and has a PT that follows her there. RN reports that patient lives sedentary life and when she does get up is heavily depencent on RW    Prior Function Level of Independence: Needs assistance   Gait / Transfers Assistance Needed: per RN pt dependent on walker, needs max encouragement to particiapte in ambulation  ADL's / Homemaking Assistance Needed: pt requires assist for all ADLS        Hand Dominance        Extremity/Trunk Assessment   Upper Extremity Assessment Upper Extremity Assessment: Overall WFL for tasks assessed    Lower Extremity Assessment Lower Extremity Assessment: Generalized weakness    Cervical / Trunk Assessment Cervical / Trunk  Assessment: Kyphotic  Communication      Cognition Arousal/Alertness: Awake/alert Behavior During Therapy: Flat affect Overall Cognitive Status: History of cognitive impairments - at baseline                                 General Comments: Hx Autism, pt appears to choose when to reply and participate per notes      General Comments      Exercises     Assessment/Plan    PT Assessment Patent does not need any further PT services  PT Problem List Decreased strength;Decreased mobility;Decreased activity tolerance       PT Treatment Interventions      PT Goals (Current goals can be found in the Care Plan section)  Acute Rehab PT Goals PT Goal Formulation: All assessment and education complete, DC therapy    Frequency      Barriers to discharge        Co-evaluation               AM-PAC PT "6 Clicks" Mobility  Outcome Measure Help needed turning from your back to your side while in a flat bed without using bedrails?: None Help needed moving from lying on your back to sitting on the side of a flat bed without using bedrails?: A Little Help needed moving to and from a bed to a chair (including a wheelchair)?: A Little Help needed standing up from a chair using your arms (e.g., wheelchair or bedside chair)?: A Little Help needed to walk in hospital room?: A Lot Help needed climbing 3-5 steps with a railing? : A Lot 6 Click Score: 17    End of Session   Activity Tolerance: Patient tolerated treatment well Patient left: in bed;with call bell/phone within reach;with bed alarm set   PT Visit Diagnosis: Difficulty in walking, not elsewhere classified (R26.2)    Time: [id number] PT Time Calculation (min) (ACUTE ONLY): 13 min   Charges:   PT Evaluation $PT Eval Low Complexity: 1 Low         Kati PT, DPT Acute Rehabilitation Services Office: [phone]  York Ram E 07/30/2019, 1:05 PM

## 2019-07-31 ENCOUNTER — Ambulatory Visit: Payer: Medicare Other | Admitting: Neurology

## 2019-07-31 DIAGNOSIS — I5032 Chronic diastolic (congestive) heart failure: Secondary | ICD-10-CM

## 2019-07-31 DIAGNOSIS — N1832 Chronic kidney disease, stage 3b: Secondary | ICD-10-CM

## 2019-07-31 DIAGNOSIS — E785 Hyperlipidemia, unspecified: Secondary | ICD-10-CM

## 2019-07-31 DIAGNOSIS — E1121 Type 2 diabetes mellitus with diabetic nephropathy: Secondary | ICD-10-CM

## 2019-07-31 DIAGNOSIS — I10 Essential (primary) hypertension: Secondary | ICD-10-CM

## 2019-07-31 LAB — GLUCOSE, CAPILLARY
Glucose-Capillary: 154 mg/dL — ABNORMAL HIGH (ref 70–99)
Glucose-Capillary: 145 mg/dL — ABNORMAL HIGH (ref 70–99)
Glucose-Capillary: 107 mg/dL — ABNORMAL HIGH (ref 70–99)
Glucose-Capillary: 114 mg/dL — ABNORMAL HIGH (ref 70–99)

## 2019-07-31 LAB — BASIC METABOLIC PANEL
Anion gap: 10 (ref 5–15)
BUN: 23 mg/dL (ref 8–23)
CO2: 23 mmol/L (ref 22–32)
Calcium: 8.7 mg/dL — ABNORMAL LOW (ref 8.9–10.3)
Chloride: 102 mmol/L (ref 98–111)
Creatinine, Ser: 0.95 mg/dL (ref 0.44–1.00)
GFR calc Af Amer: >60 mL/min (ref >60)
GFR calc non Af Amer: 54 mL/min — ABNORMAL LOW (ref >60)
Glucose, Bld: 166 mg/dL — ABNORMAL HIGH (ref 70–99)
Potassium: 4.3 mmol/L (ref 3.5–5.1)
Sodium: 135 mmol/L (ref 135–145)

## 2019-07-31 LAB — CBC
HCT: 34.4 % — ABNORMAL LOW (ref 36.0–46.0)
Hemoglobin: 10.4 g/dL — ABNORMAL LOW (ref 12.0–15.0)
MCH: 26.3 pg (ref 26.0–34.0)
MCHC: 30.2 g/dL (ref 30.0–36.0)
MCV: 87.1 fL (ref 80.0–100.0)
Platelets: 225 10*3/uL (ref 150–400)
RBC: 3.95 MIL/uL (ref 3.87–5.11)
RDW: 15.8 % — ABNORMAL HIGH (ref 11.5–15.5)
WBC: 10 10*3/uL (ref 4.0–10.5)
nRBC: 0 % (ref 0.0–0.2)
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
## 2019-07-31 NOTE — Progress Notes (Addendum)
PROGRESS NOTE    Lisa Villegas  SSN-[id number] DOB: December 01, 1930 DOA: 07/28/2019 PCP: [name], MD    Brief Narrative:  Patient admitted to the hospital with the working diagnosis of acute hypoxic respiratory failure due to right upper lobe pneumonia.  86 year old female with the past medical history of CVA, diastolic heart failure, type 2 diabetes mellitus, chronic kidney disease stage III, hypertension, dyslipidemia and autism spectrum disorder.  Patient tested positive for SARS COVID-19 on April 03, 2019, did not require hospitalization.  She lives in a assisted living facility, she was noted to have generalized weakness and lethargy for a couple of days, EMS was called and his oxygen saturation was found to be 88%.  On her initial physical examination blood pressure 144/105, heart rate 109, respiratory rate 26, temperature 99.1, oxygen saturation 94% on 3 L per nasal cannula.  Her lungs were clear to auscultation bilaterally, heart S1-S2 present with me, abdomen was soft, trace bilateral lower extremity edema. Sodium 136, potassium 4.6, chloride 102, bicarb 25, glucose 193, BUN 26, creatinine 1.0, lactic acid 2.9, white count 10.9, hemoglobin 11.9, hematocrit 39.4, platelets 273.  SARS COVID-19 negative.  D-dimer 1.1, chest radiograph with the right upper lobe alveolar, dense infiltrate.  CT chest negative for pulmonary embolism, positive for right upper lobe alveolar, dense infiltrate.  EKG 108 bpm, normal axis, normal intervals, sinus rhythm with PVC in a bigeminy pattern, no ST segment or T wave changes.  Assessment & Plan:   Principal Problem:   Right upper lobe pneumonia Active Problems:   Type II diabetes mellitus with renal manifestations (HCC)   Hypertension   Hyperlipidemia   CKD (chronic kidney disease), stage III (HCC)   Chronic diastolic heart failure, NYHA class 2 (HCC)   History of CVA (cerebrovascular accident)   Acute respiratory failure with hypoxia  ([place])   1. Acute hypoxic respiratory failure due to community acquired pneumonia, right upper lobe, present on admission. Oxygenation is 92% on 2 L per min per [place]. Wbc is 10, cultures have been no growth and patient now has tested negative for COVID 19. Regular diet per speech therapy.   Will continue antibiotic therapy with IV ceftriaxone and oral azithromycin, plan for 8 day therapy, considering large dense infiltrate at the right upper lobe. Out of bed to chair tid with meals, continue physical and occupational therapy.   Possible dc to assisted living facility in am.   2. HTN. Continue blood pressure control with verapamil and hydralazine.   3. T2Dm with dyslipidemia. Fasting glucose this am 166 mg/dl. Continue insulin sliding scale for glucose cover and monitoring. Poor oral intake.   Continue with atorvastatin.   4. Cognitive impairment. No agitation, patient deconditioned.   Status is: Inpatient  Remains inpatient appropriate because:IV treatments appropriate due to intensity of illness or inability to take PO   Dispo: The patient is from: ALF              Anticipated d/c is to: ALF              Anticipated d/c date is: 1 day              Patient currently is medically stable to d/c.        DVT prophylaxis: Enoxaparin   Code Status:   dnr   Family Communication:  I called her contact number, no answer, I left a message on her voicemail.      Subjective: Limited communication, patient not in dyspnea  or chest pain, no nausea or vomiting.   Objective: Vitals:   07/30/19 0534 07/30/19 1439 07/30/19 2150 07/31/19 0509  BP: (!) 119/51 126/63 (!) 159/71 (!) 158/70  Pulse: 65 76 76 69  Resp: 18 17 18 19   Temp: 99.5 F (37.5 C) 98.8 F (37.1 C) 98.7 F (37.1 C) 98.7 F (37.1 C)  TempSrc: Oral Oral Oral Oral  SpO2: 92% 95% 93% 92%  Weight:      Height:        Intake/Output Summary (Last 24 hours) at 07/31/2019 1133 Last data filed at 07/31/2019 1100 Gross per 24  hour  Intake 920 ml  Output 1400 ml  Net -480 ml   Filed Weights   07/28/19 1759 07/29/19 0425  Weight: 63 kg 63 kg    Examination:   General: Not in pain or dyspnea, deconditioned  Neurology: patient with eyes closed, only answering to direct questions.  E ENT: mild pallor, no icterus, oral mucosa moist Cardiovascular: No JVD. S1-S2 present, rhythmic, no gallops, rubs, or murmurs. No lower extremity edema. Pulmonary: positive breath sounds bilaterally, adequate air movement, no wheezing, or rhonchi, positive rales at right upper lobe. Limited auscultation to anterior chest.  Gastrointestinal. Abdomen with, no organomegaly, non tender, no rebound or guarding Skin. No rashes Musculoskeletal: no joint deformities     Data Reviewed: I have personally reviewed following labs and imaging studies  CBC: Recent Labs  Lab 07/28/19 1828 07/29/19 0517 07/30/19 0438 07/31/19 0436  WBC 10.9* 11.9* 9.5 10.0  NEUTROABS 8.7*  --   --   --   HGB 11.9* 10.6* 9.9* 10.4*  HCT 39.4 35.3* 32.6* 34.4*  MCV 86.2 86.9 85.1 87.1  PLT 273 223 223 [id number]   Basic Metabolic Panel: Recent Labs  Lab 07/28/19 1828 07/29/19 0517 07/30/19 0438 07/31/19 0436  NA 136 134* 135 135  K 4.6 4.0 4.0 4.3  CL 102 102 103 102  CO2 25 24 23 23   GLUCOSE 193* 145* 103* 166*  BUN 26* 20 22 23   CREATININE 1.00 0.75 0.95 0.95  CALCIUM 9.4 8.7* 8.6* 8.7*  MG  --  1.7  --   --    GFR: Estimated Creatinine Clearance: 39.8 mL/min (by C-G formula based on SCr of 0.95 mg/dL). Liver Function Tests: Recent Labs  Lab 07/28/19 1828  AST 23  ALT 26  ALKPHOS 83  BILITOT 0.6  PROT 7.1  ALBUMIN 3.8   No results for input(s): LIPASE, AMYLASE in the last 168 hours. No results for input(s): AMMONIA in the last 168 hours. Coagulation Profile: Recent Labs  Lab 07/28/19 1828  INR 1.1   Cardiac Enzymes: No results for input(s): CKTOTAL, CKMB, CKMBINDEX, TROPONINI in the last 168 hours. BNP (last 3 results) No  results for input(s): PROBNP in the last 8760 hours. HbA1C: Recent Labs    07/28/19 1839  HGBA1C 8.1*   CBG: Recent Labs  Lab 07/30/19 0726 07/30/19 1144 07/30/19 1653 07/30/19 2219 07/31/19 0727  GLUCAP 116* 241* 145* 175* 154*   Lipid Profile: No results for input(s): CHOL, HDL, LDLCALC, TRIG, CHOLHDL, LDLDIRECT in the last 72 hours. Thyroid Function Tests: Recent Labs    07/29/19 1249  TSH 0.506   Anemia Panel: No results for input(s): VITAMINB12, FOLATE, FERRITIN, TIBC, IRON, RETICCTPCT in the last 72 hours.    Radiology Studies: I have reviewed all of the imaging during this hospital visit personally     Scheduled Meds: . aspirin EC  81 mg Oral Daily  .  atorvastatin  20 mg Oral q1800  . enoxaparin (LOVENOX) injection  40 mg Subcutaneous Q24H  . fesoterodine  8 mg Oral Daily  . hydrALAZINE  10 mg Oral TID  . insulin aspart  0-9 Units Subcutaneous TID WC  . pantoprazole  40 mg Oral Daily  . verapamil  120 mg Oral QHS   Continuous Infusions: . cefTRIAXone (ROCEPHIN)  IV 1 g (07/30/19 1054)     LOS: 3 days        Geordie Nooney Gerome Apley, MD

## 2019-08-01 LAB — GLUCOSE, CAPILLARY
Glucose-Capillary: 119 mg/dL — ABNORMAL HIGH (ref 70–99)
Glucose-Capillary: 145 mg/dL — ABNORMAL HIGH (ref 70–99)
Glucose-Capillary: 135 mg/dL — ABNORMAL HIGH (ref 70–99)

## 2019-08-01 MED ORDER — CEPHALEXIN 500 MG PO CAPS
500.0000 mg | ORAL_CAPSULE | Freq: Three times a day (TID) | ORAL | Status: DC
  Administered 2019-08-01: 500 mg via ORAL
  Filled 2019-08-01: qty 1

## 2019-08-01 MED ORDER — CEPHALEXIN 500 MG PO CAPS: 500.0000 mg | ORAL_CAPSULE | Freq: Three times a day (TID) | ORAL | 0 refills | Status: AC

## 2019-08-01 NOTE — Progress Notes (Signed)
[facility] contacted and learned they were running behind. [place] aware.

## 2019-08-01 NOTE — Progress Notes (Signed)
Manufacturing engineer [facility]) Hospital Liaison Note:  Received a referral for hospice or palliative services from [facility] [place]) upon patient discharge back to [facility] this afternoon. Unable to reach patient POA via telephone to confirm patient wishes so left [facility] contact information for POA to return call. [facility] TOC aware.  [facility] HLT will continue to reach out to patient POA to discuss Hospice verses Palliative services and how [facility] can support patient.  Also,  [facility] is aware of above and will continue to reach out to [facility] as well, following up with patient and Facility after discharge.  Thank you for this referral,  Gar Ponto, RN [place] [facility] (on [place]) [phone]

## 2019-08-01 NOTE — Plan of Care (Signed)
  Problem: Health Behavior/Discharge Planning: Goal: Ability to manage health-related needs will improve Outcome: Progressing   Problem: Clinical Measurements: Goal: Ability to maintain clinical measurements within normal limits will improve Outcome: Progressing Goal: Will remain free from infection Outcome: Progressing Goal: Diagnostic test results will improve Outcome: Progressing Goal: Respiratory complications will improve Outcome: Progressing   Problem: Activity: Goal: Risk for activity intolerance will decrease Outcome: Progressing   Problem: Nutrition: Goal: Adequate nutrition will be maintained Outcome: Progressing   Problem: Elimination: Goal: Will not experience complications related to bowel motility Outcome: Progressing   Problem: Safety: Goal: Ability to remain free from injury will improve Outcome: Progressing   Problem: Activity: Goal: Ability to tolerate increased activity will improve Outcome: Progressing   Problem: Respiratory: Goal: Ability to maintain adequate ventilation will improve Outcome: Progressing

## 2019-08-01 NOTE — Discharge Summary (Addendum)
Physician Discharge Summary  Lisa Villegas SSN-[id number] DOB: [birth year] DOA: 07/28/2019  PCP: [name], MD  Admit date: 07/28/2019 Discharge date: 08/01/2019  Admitted From: ALF  Disposition:  ALF   Recommendations for Outpatient Follow-up and new medication changes:  1. Follow up with Dr. Ashby Dawes in 7 days 2. Continue antibiotic therapy with Cephalexin for 5 more days.  3. Continue supplemental 02 at home 2 L/ min per [place]. 4. I spoke with her POA, [name], and will add Hospice services to Mrs. Lisa Villegas care.   Home Health: Yes   Equipment/Devices: no    Discharge Condition: stable CODE STATUS: DNR   Diet recommendation: heart healthy and diabetic prudent.   Brief/Interim Summary: Patient admitted to the hospital with the working diagnosis of acute hypoxic respiratory failure due to right upper lobe pneumonia.   86 year old female with the past medical history of CVA, diastolic heart failure, type 2 diabetes mellitus, chronic kidney disease stage III, hypertension, dyslipidemia and autism spectrum disorder.  Patient tested positive for SARS COVID-19 on April 03, 2019, did not require hospitalization.  She lives in a assisted living facility, she was noted to have generalized weakness and lethargy for a couple of days, EMS was called and his oxygen saturation was found to be 88%.  On her initial physical examination blood pressure 144/105, heart rate 109, respiratory rate 26, temperature 99.1, oxygen saturation 94% on 3 L per nasal cannula.  Her lungs were clear to auscultation bilaterally, heart S1-S2 present and rhythmic, abdomen was soft, trace bilateral lower extremity edema. Sodium 136, potassium 4.6, chloride 102, bicarb 25, glucose 193, BUN 26, creatinine 1.0, lactic acid 2.9, white count 10.9, hemoglobin 11.9, hematocrit 39.4, platelets 273.  SARS COVID-19 negative.  D-dimer 1.1, chest radiograph with the right upper lobe alveolar, dense infiltrate.  CT chest  negative for pulmonary embolism, positive for right upper lobe alveolar, dense infiltrate.  EKG 108 bpm, normal axis, normal intervals, sinus rhythm with PVC in a bigeminy pattern, no ST segment or T wave changes.   1. Acute hypoxic respiratory failure due to right upper lobe community acquired pneumonia (present on admission). Patient was admitted to the medical ward, she was placed on supplemental 02 per [place] and IV antibiotic therapy with ceftriaxone.   Patient responded well to medical therapy, improved mentation and oxygenation. Antibiotic therapy changed to oral cephalexin that will be continued for 5 more days. Her oxymetry at discharge at rest 88% while sleeping and 91% when awake, will prescribe home 02 and continue close oxymetry monitoring.   Patient will continue home health services at home.   2. HTN. Blood pressure remained well controlled with verapamil and hydralazine.   3. T2DM with dyslipidemia. Glucose remained stable with insulin sliding scale, at patient will resume diabetic regimen with metformin.    Continue with atorvastatin.   4. Cognitive impairment. No agitation, mentation has improved.   5. Iron deficiency anemia. Hgb remain stable, patient will continue on iron tablets.    Sepsis ruled out, patient with hypoxemia related to pneumonia and lactic acid elevation with no acidosis (hyperlactatemia).   Discharge Diagnoses:  Principal Problem:   Right upper lobe pneumonia Active Problems:   Type II diabetes mellitus with renal manifestations (HCC)   Hypertension   Hyperlipidemia   CKD (chronic kidney disease), stage III (HCC)   Chronic diastolic heart failure, NYHA class 2 (HCC)   History of CVA (cerebrovascular accident)   Acute respiratory failure with hypoxia [facility])    Discharge  Instructions   Allergies as of 08/01/2019      Reactions   Penicillins Rash   On MAR: Has patient had a PCN reaction causing immediate rash, facial/tongue/throat swelling, SOB  or lightheadedness with hypotension: Yes Has patient had a PCN reaction causing severe rash involving mucus membranes or skin necrosis: Unk Has patient had a PCN reaction that required hospitalization: Unk Has patient had a PCN reaction occurring within the last 10 years: Unk If all of the above answers are "NO", then may proceed with Cephalosporin use.      Medication List    TAKE these medications   aspirin 81 MG tablet Take 1 tablet (81 mg total) by mouth daily.   atorvastatin 20 MG tablet Commonly known as: LIPITOR Take 1 tablet (20 mg total) by mouth every evening.   Baby Shampoo Sham Place 1 application into both eyes See admin instructions. Use to cleanse eyes and eyelids at bedtime   calcium carbonate 750 MG chewable tablet Commonly known as: TUMS EX Chew 1 tablet by mouth daily.   cephALEXin 500 MG capsule Commonly known as: KEFLEX Take 1 capsule (500 mg total) by mouth every 8 (eight) hours for 5 days.   CERTA-VITE PO Take 1 tablet by mouth daily.   ferrous sulfate 325 (65 FE) MG tablet Take 325 mg by mouth daily.   fesoterodine 8 MG Tb24 tablet Commonly known as: Toviaz TAKE 1 TAB BY MOUTH EVERY DAY.  DO NOT CRUSH OR CHEW . What changed:   how much to take  how to take this  when to take this  additional instructions   GLUCERNA PO Take 1 Can by mouth 2 (two) times daily.   hydrALAZINE 25 MG tablet Commonly known as: APRESOLINE Take 25 mg by mouth 3 (three) times daily as needed (for SBP 150 or greater and DBP 85 or greter).   hydrALAZINE 10 MG tablet Commonly known as: APRESOLINE Take 1 tablet (10 mg total) by mouth 3 (three) times daily.   INTEGRA PO Take by mouth. 120-40-3mg  capsule,1cap po daily   magnesium oxide 400 MG tablet Commonly known as: MAG-OX Take 400 mg by mouth every morning.   metFORMIN 500 MG tablet Commonly known as: GLUCOPHAGE Take 500 mg by mouth 2 (two) times daily with a meal.   omeprazole 20 MG capsule Commonly  known as: PRILOSEC Take 20 mg by mouth daily.   PREVIDENT 5000 BOOSTER PLUS DT Place 1 application onto teeth every evening.   SENSODYNE COMPLETE PROTECTION MT Use as directed in the mouth or throat. Mint,place one application onto teeth daily as directed   verapamil 120 MG CR tablet Commonly known as: CALAN-SR Take 120 mg by mouth at bedtime.   Vitamin D3 50 MCG (2000 UT) Tabs Take 1 tablet by mouth daily.       Allergies  Allergen Reactions  . Penicillins Rash    On MAR: Has patient had a PCN reaction causing immediate rash, facial/tongue/throat swelling, SOB or lightheadedness with hypotension: Yes Has patient had a PCN reaction causing severe rash involving mucus membranes or skin necrosis: Unk Has patient had a PCN reaction that required hospitalization: Unk Has patient had a PCN reaction occurring within the last 10 years: Unk If all of the above answers are "NO", then may proceed with Cephalosporin use.         Procedures/Studies: CT ANGIO CHEST PE W OR WO CONTRAST  Result Date: 07/29/2019 CLINICAL DATA:  Hypoxia and elevated D-dimer EXAM: CT ANGIOGRAPHY  CHEST WITH CONTRAST TECHNIQUE: Multidetector CT imaging of the chest was performed using the standard protocol during bolus administration of intravenous contrast. Multiplanar CT image reconstructions and MIPs were obtained to evaluate the vascular anatomy. CONTRAST:  121mL OMNIPAQUE IOHEXOL 350 MG/ML SOLN COMPARISON:  Chest radiograph Jul 28, 2019 FINDINGS: Cardiovascular: No demonstrable pulmonary embolus. There is no appreciable thoracic aortic aneurysm or dissection. There are scattered foci of calcification in visualized great vessels. There are foci of aortic atherosclerosis. There are foci of coronary artery calcification. There is left ventricular hypertrophy. Minimal pericardial effusion may be within physiologic range. There is no pericardial thickening. Mediastinum/Nodes: There is an inhomogeneous partially  calcified mass right lobe of the thyroid measuring 1.8 x 1.6 cm. Thyroid otherwise appears normal. There is no appreciable thoracic adenopathy. There is a fairly sizable hiatal type hernia. Lungs/Pleura: There is airspace consolidation in the right upper lobe with primary involvement in the posterior segment of the right upper lobe but with infiltrate as well in portions of the anterior and apical segments of the right upper lobe. There are bilateral pleural effusions, fairly small, with atelectatic change in both lower lung regions. A small area of infiltrate is noted in the medial segment of the right middle lobe. A second smaller of infiltrate is noted in the inferior lingula. 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 There is aortic atherosclerosis in the upper abdomen as well as foci of mesenteric arterial vascular calcification. Visualized upper abdominal structures otherwise appear unremarkable. Musculoskeletal: There is degenerative change in the thoracic spine with mild anterior wedging of several lower thoracic vertebral bodies. There is a degree of underlying osteoporosis. No blastic or lytic bone lesions. No chest wall lesions are evident. Review of the MIP images confirms the above findings. IMPRESSION: 1. No demonstrable pulmonary embolus. No thoracic aortic aneurysm or dissection. There are foci of aortic atherosclerosis as well as foci of great vessel and coronary artery calcification. 2. Fairly extensive airspace opacity consistent with pneumonia in the right upper lobe, primarily but not exclusively arising in the posterior segment right upper lobe. Much smaller areas of infiltrate noted in the right middle lobe and inferior lingula. Pleural effusions bilaterally with bibasilar compressive atelectasis. Mild superimposed pneumonia in these areas cannot be excluded. 3.  No evident adenopathy. 4. Right lobe  thyroid mass measuring 1.8 x 1.6 cm, inhomogeneous in appearance and partially calcified. In the setting of significant comorbidities or limited life expectancy, no follow-up recommended unless clinical situation warrants otherwise. (Ref: J Am Coll Radiol. 2015 Feb;12(2): 143-50). 5.  Fairly sizable hiatal hernia. 6. 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 Aortic Atherosclerosis (ICD10-I70.0). Electronically Signed   By: Lowella Grip III M.D.   On: 07/29/2019 10:55   DG Chest Port 1 View  Result Date: 07/28/2019 CLINICAL DATA:  Weakness EXAM: PORTABLE CHEST 1 VIEW COMPARISON:  03/30/2019 FINDINGS: Large area of consolidation in the right upper lobe. Lungs are otherwise clear. No pneumothorax or pleural effusion. Mild cardiomegaly with calcific aortic atherosclerosis. IMPRESSION: Right upper lobe consolidation, likely pneumonia. Electronically Signed   By: Ulyses Jarred M.D.   On: 07/28/2019 19:02        Subjective: Patient is more awake and alert, no dyspnea, no chest pain, no nausea or vomiting.   Discharge Exam: Vitals:   07/31/19 2145 08/01/19 0550  BP: (!) 168/73 (!) 146/61  Pulse: 74 71  Resp: 18 18  Temp: 98.8 F (37.1 C) 98.1 F (36.7 C)  SpO2: 95% 94%   Vitals:   07/31/19 0509 07/31/19 1313 07/31/19 2145 08/01/19 0550  BP: (!) 158/70 (!) 161/73 (!) 168/73 (!) 146/61  Pulse: 69 73 74 71  Resp: 19  18 18   Temp: 98.7 F (37.1 C) 99.1 F (37.3 C) 98.8 F (37.1 C) 98.1 F (36.7 C)  TempSrc: Oral Oral Oral Oral  SpO2: 92% 95% 95% 94%  Weight:      Height:        General: not in pain or dyspnea.  Neurology: Awake and alert, non focal  E ENT: no pallor, no icterus, oral mucosa moist Cardiovascular: No JVD. S1-S2 present, rhythmic, no gallops, rubs, or murmurs. No lower extremity edema. Pulmonary: positive breath sounds bilaterally, adequate air movement, no wheezing, rhonchi mild rales at the right upper lobe.  Gastrointestinal.  Abdomen with no organomegaly, non tender, no rebound or guarding Skin. No rashes Musculoskeletal: no joint deformities   The results of significant diagnostics from this hospitalization (including imaging, microbiology, ancillary and laboratory) are listed below for reference.     Microbiology: Recent Results (from the past 240 hour(s))  Blood Culture (routine x 2)     Status: None (Preliminary result)   Collection Time: 07/28/19  5:59 PM   Specimen: BLOOD  Result Value Ref Range Status   Specimen Description   Final    BLOOD RIGHT ARM Performed at [facility] [address] [place], [place] [address]    Special Requests   Final    BOTTLES DRAWN AEROBIC AND ANAEROBIC Blood Culture adequate volume Performed at [facility] [address], [place], [place] [address]    Culture   Final    NO GROWTH 4 DAYS Performed at [facility], [place] [address], [place], [place] [address]    Report Status PENDING  Incomplete  Blood Culture (routine x 2)     Status: None (Preliminary result)   Collection Time: 07/28/19  6:28 PM   Specimen: BLOOD  Result Value Ref Range Status   Specimen Description   Final    BLOOD RIGHT WRIST Performed at [facility] [address], [place], [place] [address]    Special Requests   Final    BOTTLES DRAWN AEROBIC ONLY Blood Culture results may not be optimal due to an inadequate volume of blood received in culture bottles Performed at [facility] [address] [place], [place] [address]    Culture   Final    NO GROWTH 4 DAYS Performed at [facility], [place] [address], [place], [place] [address]    Report Status PENDING  Incomplete  Urine culture     Status: None   Collection Time: 07/28/19  6:28 PM   Specimen: In/Out Cath Urine  Result Value Ref Range Status   Specimen Description   Final    IN/OUT CATH URINE Performed at [facility] [address] [place], [place] [address]    Special Requests   Final    NONE Performed at [facility], [place] [address] [place], [place] [address]    Culture   Final    NO GROWTH Performed at [facility], [place] [address] [place],  [address]    Report Status 07/29/2019 FINAL  Final  Respiratory Panel by RT PCR (Flu A&B, Covid) - Nasopharyngeal Swab  Status: None   Collection Time: 07/28/19  6:57 PM   Specimen: Nasopharyngeal Swab  Result Value Ref Range Status   SARS Coronavirus 2 by RT PCR NEGATIVE NEGATIVE Final    Comment: (NOTE) SARS-CoV-2 target nucleic acids are NOT DETECTED. The SARS-CoV-2 RNA is generally detectable in upper respiratoy specimens during the acute phase of infection. The lowest concentration of SARS-CoV-2 viral copies this assay can detect is 131 copies/mL. A negative result does not preclude SARS-Cov-2 infection and should not be used as the sole basis for treatment or other patient management decisions. A negative result may occur with  improper specimen collection/handling, submission of specimen other than nasopharyngeal swab, presence of viral mutation(s) within the areas targeted by this assay, and inadequate number of viral copies (<131 copies/mL). A negative result must be combined with clinical observations, patient history, and epidemiological information. The expected result is Negative. Fact Sheet for Patients:  PinkCheek.be Fact Sheet for Healthcare Providers:  GravelBags.it This test is not yet ap proved or cleared by the [place] FDA and  has been authorized for detection and/or diagnosis of SARS-CoV-2 by FDA under an Emergency Use Authorization (EUA). This EUA will remain  in effect (meaning this test can be used) for the duration of the COVID-19 declaration under Section 564(b)(1) of the Act, 21 U.S.C. section 360bbb-3(b)(1), unless the  authorization is terminated or revoked sooner.    Influenza A by PCR NEGATIVE NEGATIVE Final   Influenza B by PCR NEGATIVE NEGATIVE Final    Comment: (NOTE) The Xpert Xpress SARS-CoV-2/FLU/RSV assay is intended as an aid in  the diagnosis of influenza from Nasopharyngeal swab specimens and  should not be used as a sole basis for treatment. Nasal washings and  aspirates are unacceptable for Xpert Xpress SARS-CoV-2/FLU/RSV  testing. Fact Sheet for Patients: PinkCheek.be Fact Sheet for Healthcare Providers: GravelBags.it This test is not yet approved or cleared by the [place] FDA and  has been authorized for detection and/or diagnosis of SARS-CoV-2 by  FDA under an Emergency Use Authorization (EUA). This EUA will remain  in effect (meaning this test can be used) for the duration of the  Covid-19 declaration under Section 564(b)(1) of the Act, 21  U.S.C. section 360bbb-3(b)(1), unless the authorization is  terminated or revoked. Performed at [facility], [place] [address] [place], [place] [address]   MRSA PCR Screening     Status: None   Collection Time: 07/29/19  9:46 AM   Specimen: Nasopharyngeal  Result Value Ref Range Status   MRSA by PCR NEGATIVE NEGATIVE Final    Comment:        The GeneXpert MRSA Assay (FDA approved for NASAL specimens only), is one component of a comprehensive MRSA colonization surveillance program. It is not intended to diagnose MRSA infection nor to guide or monitor treatment for MRSA infections. Performed at [facility], [place] [address], [place], [place] [address]      Labs: BNP (last 3 results) Recent Labs    03/30/19 2049  BNP [id number]   Basic Metabolic Panel: Recent Labs  Lab 07/28/19 1828 07/29/19 0517 07/30/19 0438 07/31/19 0436  NA 136 134* 135 135  K 4.6 4.0 4.0 4.3  CL 102 102 103 102  CO2 25 24 23 23   GLUCOSE 193* 145* 103* 166*   BUN 26* 20 22 23   CREATININE 1.00 0.75 0.95 0.95  CALCIUM 9.4 8.7* 8.6* 8.7*  MG  --  1.7  --   --  Liver Function Tests: Recent Labs  Lab 07/28/19 1828  AST 23  ALT 26  ALKPHOS 83  BILITOT 0.6  PROT 7.1  ALBUMIN 3.8   No results for input(s): LIPASE, AMYLASE in the last 168 hours. No results for input(s): AMMONIA in the last 168 hours. CBC: Recent Labs  Lab 07/28/19 1828 07/29/19 0517 07/30/19 0438 07/31/19 0436  WBC 10.9* 11.9* 9.5 10.0  NEUTROABS 8.7*  --   --   --   HGB 11.9* 10.6* 9.9* 10.4*  HCT 39.4 35.3* 32.6* 34.4*  MCV 86.2 86.9 85.1 87.1  PLT 273 223 223 225   Cardiac Enzymes: No results for input(s): CKTOTAL, CKMB, CKMBINDEX, TROPONINI in the last 168 hours. BNP: Invalid input(s): POCBNP CBG: Recent Labs  Lab 07/31/19 0727 07/31/19 1134 07/31/19 1631 07/31/19 2138 08/01/19 0742  GLUCAP 154* 145* 107* 114* 119*   D-Dimer No results for input(s): DDIMER in the last 72 hours. Hgb A1c No results for input(s): HGBA1C in the last 72 hours. Lipid Profile No results for input(s): CHOL, HDL, LDLCALC, TRIG, CHOLHDL, LDLDIRECT in the last 72 hours. Thyroid function studies Recent Labs    07/29/19 1249  TSH 0.506   Anemia work up No results for input(s): VITAMINB12, FOLATE, FERRITIN, TIBC, IRON, RETICCTPCT in the last 72 hours. Urinalysis    Component Value Date/Time   COLORURINE YELLOW 07/28/2019 1828   APPEARANCEUR CLEAR 07/28/2019 1828   APPEARANCEUR Clear 10/11/2017 1310   LABSPEC 1.020 07/28/2019 1828   PHURINE 6.0 07/28/2019 1828   GLUCOSEU 150 (A) 07/28/2019 1828   HGBUR NEGATIVE 07/28/2019 1828   BILIRUBINUR NEGATIVE 07/28/2019 1828   BILIRUBINUR Negative 10/11/2017 1310   KETONESUR 5 (A) 07/28/2019 1828   PROTEINUR 30 (A) 07/28/2019 1828   UROBILINOGEN negative 03/14/2016 1227   NITRITE NEGATIVE 07/28/2019 1828   LEUKOCYTESUR NEGATIVE 07/28/2019 1828   Sepsis Labs Invalid input(s): PROCALCITONIN,  WBC,   LACTICIDVEN Microbiology Recent Results (from the past 240 hour(s))  Blood Culture (routine x 2)     Status: None (Preliminary result)   Collection Time: 07/28/19  5:59 PM   Specimen: BLOOD  Result Value Ref Range Status   Specimen Description   Final    BLOOD RIGHT ARM Performed at [facility], [place] [address] [place], [place] [address]    Special Requests   Final    BOTTLES DRAWN AEROBIC AND ANAEROBIC Blood Culture adequate volume Performed at [facility] [address], [place], [place] [address]    Culture   Final    NO GROWTH 4 DAYS Performed at [facility], [place] [address], [place], [place] [address]    Report Status PENDING  Incomplete  Blood Culture (routine x 2)     Status: None (Preliminary result)   Collection Time: 07/28/19  6:28 PM   Specimen: BLOOD  Result Value Ref Range Status   Specimen Description   Final    BLOOD RIGHT WRIST Performed at [facility] [address] [place], [place] [address]    Special Requests   Final    BOTTLES DRAWN AEROBIC ONLY Blood Culture results may not be optimal due to an inadequate volume of blood received in culture bottles Performed at [facility] [address] [place], [place] [address]    Culture   Final    NO GROWTH 4 DAYS Performed at [facility], [place] [address] [place], [place] [address]    Report Status PENDING  Incomplete  Urine culture     Status: None   Collection Time: 07/28/19  6:28 PM   Specimen: In/Out Cath Urine  Result Value Ref Range Status   Specimen Description   Final    IN/OUT CATH URINE Performed at [facility], [place] [address] [place], [place] [address]    Special Requests   Final    NONE Performed at [facility], [place] [address], [place], [place] [address]    Culture   Final    NO GROWTH Performed at [facility], [place] [address], [place], [place]  [address]    Report Status 07/29/2019 FINAL  Final  Respiratory Panel by RT PCR (Flu A&B, Covid) - Nasopharyngeal Swab     Status: None   Collection Time: 07/28/19  6:57 PM   Specimen: Nasopharyngeal Swab  Result Value Ref Range Status   SARS Coronavirus 2 by RT PCR NEGATIVE NEGATIVE Final    Comment: (NOTE) SARS-CoV-2 target nucleic acids are NOT DETECTED. The SARS-CoV-2 RNA is generally detectable in upper respiratoy specimens during the acute phase of infection. The lowest concentration of SARS-CoV-2 viral copies this assay can detect is 131 copies/mL. A negative result does not preclude SARS-Cov-2 infection and should not be used as the sole basis for treatment or other patient management decisions. A negative result may occur with  improper specimen collection/handling, submission of specimen other than nasopharyngeal swab, presence of viral mutation(s) within the areas targeted by this assay, and inadequate number of viral copies (<131 copies/mL). A negative result must be combined with clinical observations, patient history, and epidemiological information. The expected result is Negative. Fact Sheet for Patients:  PinkCheek.be Fact Sheet for Healthcare Providers:  GravelBags.it This test is not yet ap proved or cleared by the [place] FDA and  has been authorized for detection and/or diagnosis of SARS-CoV-2 by FDA under an Emergency Use Authorization (EUA). This EUA will remain  in effect (meaning this test can be used) for the duration of the COVID-19 declaration under Section 564(b)(1) of the Act, 21 U.S.C. section 360bbb-3(b)(1), unless the authorization is terminated or revoked sooner.    Influenza A by PCR NEGATIVE NEGATIVE Final   Influenza B by PCR NEGATIVE NEGATIVE Final    Comment: (NOTE) The Xpert Xpress SARS-CoV-2/FLU/RSV assay is intended as an aid in  the diagnosis of influenza from Nasopharyngeal  swab specimens and  should not be used as a sole basis for treatment. Nasal washings and  aspirates are unacceptable for Xpert Xpress SARS-CoV-2/FLU/RSV  testing. Fact Sheet for Patients: PinkCheek.be Fact Sheet for Healthcare Providers: GravelBags.it This test is not yet approved or cleared by the [place] FDA and  has been authorized for detection and/or diagnosis of SARS-CoV-2 by  FDA under an Emergency Use Authorization (EUA). This EUA will remain  in effect (meaning this test can be used) for the duration of the  Covid-19 declaration under Section 564(b)(1) of the Act, 21  U.S.C. section 360bbb-3(b)(1), unless the authorization is  terminated or revoked. Performed at [facility], [place] [address], [place], [place] [address]   MRSA PCR Screening     Status: None   Collection Time: 07/29/19  9:46 AM   Specimen: Nasopharyngeal  Result Value Ref Range Status   MRSA by PCR NEGATIVE NEGATIVE Final    Comment:        The GeneXpert MRSA Assay (FDA approved for NASAL specimens only), is one component of a comprehensive  MRSA colonization surveillance program. It is not intended to diagnose MRSA infection nor to guide or monitor treatment for MRSA infections. Performed at [facility] [facility], [facility] [address], [place], [place] [address]      Time coordinating discharge: 45 minutes  SIGNED:   Tawni Millers, MD  [facility] 08/01/2019, 9:22 AM
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
## 2019-08-01 NOTE — Progress Notes (Signed)
SATURATION QUALIFICATIONS: (This note is used to comply with regulatory documentation for home oxygen)  Patient Saturations on Room Air at Rest = Lying flat 88% Sitting up 91%  Patient Saturations on Room Air while Ambulating = unable to ambulate/movement in bed = 89%  Patient Saturations on 2 Liters of oxygen while active in bed = 94%  Please briefly explain why patient needs home oxygen:

## 2019-08-01 NOTE — Progress Notes (Signed)
Report given to [employer], RN, @ [facility] about pt progress. Due to be transport via PTAR to facility. Awaiting PTAR to arrive.

## 2019-08-01 NOTE — TOC Transition Note (Addendum)
Transition of Care [facility]) - CM/SW Discharge Note   Patient Details  Name: Lisa Villegas MRN: [id number] Date of Birth: 1930-05-17  [facility] CM/SW Contact:  Lia Hopping, [place] Phone Number: 08/01/2019, 2:22 PM   Clinical Narrative:    Physician recommended and discussed hospice services with the patient legal guardian [name].  Patient will return to the facility with oxygen orders. CSW made a referral to [facility]. Oxygen will be delivered between 3:00 pm-3:00pm today. CSW reached out to [employer] to plan discharge. CSW was inform to call [place], the [employer]. CSW reached out to [facility], confirm the patient can return today. CSW sent requested clinicals and orders via HUB and to the requested fax [phone].    CSW discussed Hospice options with LG, she chose [facility]. CSW notified the hospital liaison [place].  She will follow up with the patient legal guardian.  PTAR to transport.   Confirm oxygen delivered.  FL2 updated, faxed.  Nurse call report to: [phone],  Room 140.    Final next level of care: Assisted Living Barriers to Discharge: Barriers Resolved   Patient Goals and CMS Choice Patient states their goals for this hospitalization and ongoing recovery are:: could not get pt to engage      Discharge Placement              Patient chooses bed at: [facility] Patient to be transferred to facility by: [place] Name of family member notified: Legal Guardian-[name] Patient and family notified of of transfer: 08/01/19  Discharge Plan and Services In-house Referral: Clinical Social Work              DME Arranged: Oxygen DME Agency: [facility] Date DME Agency Contacted: 08/01/19 Time DME Agency Contacted: 1210 Representative spoke with at DME Agency: Thedore Mins HH Arranged: PT          Social Determinants of Health ([place]) Interventions     Readmission Risk Interventions Readmission Risk  Prevention Plan 04/02/2019  Transportation Screening Complete  PCP or Specialist Appt within 3-5 Days Complete  HRI or [place] Complete  Social Work Consult for [place] Planning/Counseling Complete  Palliative Care Screening Not Applicable  Medication Review Press photographer) Referral to Pharmacy  Some recent data might be hidden

## 2019-08-01 NOTE — Progress Notes (Signed)
Pt transported from floor via PTAR @ 2210.

## 2019-08-01 NOTE — Progress Notes (Signed)
Pt medical necessity form completed by this RN at request of PTAR.

## 2019-08-01 NOTE — Care Management Important Message (Signed)
Important Message  Patient Details IM Letter given to [place] Case Manager to present to the Patient Name: Lisa Villegas MRN: [id number] Date of Birth: 01-08-1931   Medicare Important Message Given:  Yes     Kerin Salen 08/01/2019, 10:47 AM

## 2019-08-02 LAB — CULTURE, BLOOD (ROUTINE X 2)
Culture: NO GROWTH
Culture: NO GROWTH
Special Requests: ADEQUATE

## 2019-08-12 DIAGNOSIS — I130 Hypertensive heart and chronic kidney disease with heart failure and stage 1 through stage 4 chronic kidney disease, or unspecified chronic kidney disease: Secondary | ICD-10-CM | POA: Diagnosis not present

## 2019-08-12 DIAGNOSIS — Z789 Other specified health status: Secondary | ICD-10-CM | POA: Diagnosis not present

## 2019-08-12 DIAGNOSIS — E1142 Type 2 diabetes mellitus with diabetic polyneuropathy: Secondary | ICD-10-CM | POA: Diagnosis not present

## 2019-08-12 DIAGNOSIS — R531 Weakness: Secondary | ICD-10-CM | POA: Diagnosis not present

## 2019-08-12 DIAGNOSIS — E1122 Type 2 diabetes mellitus with diabetic chronic kidney disease: Secondary | ICD-10-CM | POA: Diagnosis not present

## 2019-08-12 DIAGNOSIS — R29898 Other symptoms and signs involving the musculoskeletal system: Secondary | ICD-10-CM | POA: Diagnosis not present

## 2019-08-12 DIAGNOSIS — E785 Hyperlipidemia, unspecified: Secondary | ICD-10-CM | POA: Diagnosis not present

## 2019-08-12 DIAGNOSIS — D649 Anemia, unspecified: Secondary | ICD-10-CM | POA: Diagnosis not present

## 2019-08-12 DIAGNOSIS — Z09 Encounter for follow-up examination after completed treatment for conditions other than malignant neoplasm: Secondary | ICD-10-CM | POA: Diagnosis not present

## 2019-08-12 DIAGNOSIS — Z7409 Other reduced mobility: Secondary | ICD-10-CM | POA: Diagnosis not present

## 2019-08-12 DIAGNOSIS — I10 Essential (primary) hypertension: Secondary | ICD-10-CM | POA: Diagnosis not present

## 2019-08-14 DIAGNOSIS — J189 Pneumonia, unspecified organism: Secondary | ICD-10-CM | POA: Diagnosis not present

## 2019-08-14 DIAGNOSIS — R279 Unspecified lack of coordination: Secondary | ICD-10-CM | POA: Diagnosis not present

## 2019-08-14 DIAGNOSIS — M6281 Muscle weakness (generalized): Secondary | ICD-10-CM | POA: Diagnosis not present

## 2019-08-14 DIAGNOSIS — R2681 Unsteadiness on feet: Secondary | ICD-10-CM | POA: Diagnosis not present

## 2019-08-15 ENCOUNTER — Ambulatory Visit: Payer: Medicare Other | Admitting: Neurology

## 2019-08-15 DIAGNOSIS — I130 Hypertensive heart and chronic kidney disease with heart failure and stage 1 through stage 4 chronic kidney disease, or unspecified chronic kidney disease: Secondary | ICD-10-CM | POA: Diagnosis not present

## 2019-08-15 DIAGNOSIS — R413 Other amnesia: Secondary | ICD-10-CM | POA: Diagnosis not present

## 2019-08-15 DIAGNOSIS — N1831 Chronic kidney disease, stage 3a: Secondary | ICD-10-CM | POA: Diagnosis not present

## 2019-08-15 DIAGNOSIS — E049 Nontoxic goiter, unspecified: Secondary | ICD-10-CM | POA: Diagnosis not present

## 2019-08-15 DIAGNOSIS — E785 Hyperlipidemia, unspecified: Secondary | ICD-10-CM | POA: Diagnosis not present

## 2019-08-15 DIAGNOSIS — F339 Major depressive disorder, recurrent, unspecified: Secondary | ICD-10-CM | POA: Diagnosis not present

## 2019-08-15 DIAGNOSIS — Z0000 Encounter for general adult medical examination without abnormal findings: Secondary | ICD-10-CM | POA: Diagnosis not present

## 2019-08-15 DIAGNOSIS — E1122 Type 2 diabetes mellitus with diabetic chronic kidney disease: Secondary | ICD-10-CM | POA: Diagnosis not present

## 2019-08-15 DIAGNOSIS — F840 Autistic disorder: Secondary | ICD-10-CM | POA: Diagnosis not present

## 2019-08-15 DIAGNOSIS — E1142 Type 2 diabetes mellitus with diabetic polyneuropathy: Secondary | ICD-10-CM | POA: Diagnosis not present

## 2019-08-15 DIAGNOSIS — I5032 Chronic diastolic (congestive) heart failure: Secondary | ICD-10-CM | POA: Diagnosis not present

## 2019-08-15 DIAGNOSIS — I129 Hypertensive chronic kidney disease with stage 1 through stage 4 chronic kidney disease, or unspecified chronic kidney disease: Secondary | ICD-10-CM | POA: Diagnosis not present

## 2019-08-28 ENCOUNTER — Ambulatory Visit: Payer: Medicare Other | Admitting: Neurology

## 2019-08-30 ENCOUNTER — Emergency Department (HOSPITAL_BASED_OUTPATIENT_CLINIC_OR_DEPARTMENT_OTHER)
Admission: EM | Admit: 2019-08-30 | Discharge: 2019-08-30 | Disposition: A | Discharge status: Home / Self Care | Payer: Medicare Other | Attending: Emergency Medicine | Admitting: Emergency Medicine

## 2019-08-30 ENCOUNTER — Other Ambulatory Visit: Payer: Self-pay

## 2019-08-30 ENCOUNTER — Emergency Department (HOSPITAL_BASED_OUTPATIENT_CLINIC_OR_DEPARTMENT_OTHER): Payer: Medicare Other

## 2019-08-30 ENCOUNTER — Encounter (HOSPITAL_BASED_OUTPATIENT_CLINIC_OR_DEPARTMENT_OTHER): Payer: Self-pay | Admitting: *Deleted

## 2019-08-30 DIAGNOSIS — Z79899 Other long term (current) drug therapy: Secondary | ICD-10-CM | POA: Diagnosis not present

## 2019-08-30 DIAGNOSIS — N189 Chronic kidney disease, unspecified: Secondary | ICD-10-CM | POA: Diagnosis not present

## 2019-08-30 DIAGNOSIS — Z7984 Long term (current) use of oral hypoglycemic drugs: Secondary | ICD-10-CM | POA: Diagnosis not present

## 2019-08-30 DIAGNOSIS — Z880 Allergy status to penicillin: Secondary | ICD-10-CM | POA: Diagnosis not present

## 2019-08-30 DIAGNOSIS — E1140 Type 2 diabetes mellitus with diabetic neuropathy, unspecified: Secondary | ICD-10-CM | POA: Insufficient documentation

## 2019-08-30 DIAGNOSIS — R4182 Altered mental status, unspecified: Secondary | ICD-10-CM | POA: Diagnosis not present

## 2019-08-30 DIAGNOSIS — F840 Autistic disorder: Secondary | ICD-10-CM | POA: Insufficient documentation

## 2019-08-30 DIAGNOSIS — I130 Hypertensive heart and chronic kidney disease with heart failure and stage 1 through stage 4 chronic kidney disease, or unspecified chronic kidney disease: Secondary | ICD-10-CM | POA: Insufficient documentation

## 2019-08-30 DIAGNOSIS — R531 Weakness: Secondary | ICD-10-CM | POA: Insufficient documentation

## 2019-08-30 DIAGNOSIS — Z7982 Long term (current) use of aspirin: Secondary | ICD-10-CM | POA: Diagnosis not present

## 2019-08-30 DIAGNOSIS — E1122 Type 2 diabetes mellitus with diabetic chronic kidney disease: Secondary | ICD-10-CM | POA: Insufficient documentation

## 2019-08-30 DIAGNOSIS — Z0000 Encounter for general adult medical examination without abnormal findings: Secondary | ICD-10-CM

## 2019-08-30 DIAGNOSIS — I5032 Chronic diastolic (congestive) heart failure: Secondary | ICD-10-CM | POA: Diagnosis not present

## 2019-08-30 LAB — COMPREHENSIVE METABOLIC PANEL
ALT: 17 U/L (ref 0–44)
AST: 19 U/L (ref 15–41)
Albumin: 3.6 g/dL (ref 3.5–5.0)
Alkaline Phosphatase: 105 U/L (ref 38–126)
Anion gap: 14 (ref 5–15)
BUN: 26 mg/dL — ABNORMAL HIGH (ref 8–23)
CO2: 21 mmol/L — ABNORMAL LOW (ref 22–32)
Calcium: 9.5 mg/dL (ref 8.9–10.3)
Chloride: 103 mmol/L (ref 98–111)
Creatinine, Ser: 1.05 mg/dL — ABNORMAL HIGH (ref 0.44–1.00)
GFR calc Af Amer: 56 mL/min — ABNORMAL LOW (ref >60)
GFR calc non Af Amer: 48 mL/min — ABNORMAL LOW (ref >60)
Glucose, Bld: 139 mg/dL — ABNORMAL HIGH (ref 70–99)
Potassium: 4.8 mmol/L (ref 3.5–5.1)
Sodium: 138 mmol/L (ref 135–145)
Total Bilirubin: 0.1 mg/dL — ABNORMAL LOW (ref 0.3–1.2)
Total Protein: 7 g/dL (ref 6.5–8.1)

## 2019-08-30 LAB — CBC WITH DIFFERENTIAL/PLATELET
Abs Immature Granulocytes: 0.06 10*3/uL (ref 0.00–0.07)
Basophils Absolute: 0.1 10*3/uL (ref 0.0–0.1)
Basophils Relative: 1 %
Eosinophils Absolute: 0.1 10*3/uL (ref 0.0–0.5)
Eosinophils Relative: 1 %
HCT: 37.4 % (ref 36.0–46.0)
Hemoglobin: 11.3 g/dL — ABNORMAL LOW (ref 12.0–15.0)
Immature Granulocytes: 1 %
Lymphocytes Relative: 18 %
Lymphs Abs: 1.5 10*3/uL (ref 0.7–4.0)
MCH: 25.5 pg — ABNORMAL LOW (ref 26.0–34.0)
MCHC: 30.2 g/dL (ref 30.0–36.0)
MCV: 84.2 fL (ref 80.0–100.0)
Monocytes Absolute: 0.5 10*3/uL (ref 0.1–1.0)
Monocytes Relative: 6 %
Neutro Abs: 6.4 10*3/uL (ref 1.7–7.7)
Neutrophils Relative %: 73 %
Platelets: 387 10*3/uL (ref 150–400)
RBC: 4.44 MIL/uL (ref 3.87–5.11)
RDW: 15.6 % — ABNORMAL HIGH (ref 11.5–15.5)
WBC: 8.7 10*3/uL (ref 4.0–10.5)
nRBC: 0 % (ref 0.0–0.2)

## 2019-08-30 LAB — URINALYSIS, ROUTINE W REFLEX MICROSCOPIC
Bilirubin Urine: NEGATIVE
Glucose, UA: NEGATIVE mg/dL
Hgb urine dipstick: NEGATIVE
Ketones, ur: NEGATIVE mg/dL
Leukocytes,Ua: NEGATIVE
Nitrite: NEGATIVE
Protein, ur: NEGATIVE mg/dL
Specific Gravity, Urine: 1.015 (ref 1.005–1.030)
pH: 5.5 (ref 5.0–8.0)

## 2019-08-30 NOTE — ED Triage Notes (Signed)
Care taker states altered mental status x 2 days. Pt lives at assist living. Care taker states more herself today
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
## 2019-08-30 NOTE — Discharge Instructions (Addendum)
As we discussed, your work-up today was reassuring.  Your urine did not show any signs of infection and there was no infection on your chest x-ray.  Follow-up with your primary care doctor.  Return to emergency department for any fever, confusion, vomiting or any other worsening or concerning symptoms.

## 2019-09-05 ENCOUNTER — Other Ambulatory Visit: Payer: Self-pay

## 2019-09-05 ENCOUNTER — Non-Acute Institutional Stay: Payer: Medicare Other | Admitting: Hospice

## 2019-09-05 DIAGNOSIS — Z515 Encounter for palliative care: Secondary | ICD-10-CM

## 2019-09-05 DIAGNOSIS — F0390 Unspecified dementia without behavioral disturbance: Secondary | ICD-10-CM

## 2019-09-05 NOTE — Progress Notes (Addendum)
[facility] Pall[facility] Consult Note Telephone: [phone]  Fax: [phone]  PATIENT NAME: Lisa Villegas DOB: [birth year] MRN: [id number]  PRIMARY CARE PROVIDER:   [name], MD  REFERRING PROVIDER:  [name], [address] [place],  [place] [address]  RESPONSIBLE PARTY:[name] [name] [phone]    RECOMMENDATIONS/PLAN: 1. Advance Care Planning/Goals of Care:Visit is to build trust and follow-up on file palliative care.  Visit consisted of counseling and education dealing with the complex and emotionally intense issues of symptom management and palliative care in the setting of serious and potentially life-threatening illness. Palliative care team will continue to support patient, patient's family, and medical team.  Goals include to maximize quality of life and symptom management.Patient remains aDNR. Signed DNR is in the chart and uploaded in [facility].  NP called responsible party and left her a voicemail with callback number.  Responsible party later called back and received updates on visit.  She expressed gratitude for the call/update. 2.Symptom Management:Patient in no medical acuity, denies pain/discomfort.  No coughing or respiratory distress. She was treated for pneumonia in May 2021and seen in the ED 08/30/2019 for altered mental status which resolved shortly.  Vital signs, pertinent labs and imaging results were reassuring Per chart review.  Memory loss related to Dementia is ongoing and at baseline. FAST 6c.Patient ambulates with her rolling walker. She requires assistance with ADLs.Appetite is fair.  Nursing with no concerns. Encouraged ongoingsupportive nursing care. 3. Follow up Palliative Care Visit: Palliative care will continue to follow for goals of care clarification and symptom management.  I spent48 minutes providing this consultation.More than 50% of the time in this  consultation was spent on coordinating Communication.  HISTORY OF PRESENT ILLNESS:Lisa Villegas a 86 y.o.year oldfemalewith multiple medical problems including s/ CVA, dementia, gait disturbance, Autism, HTN, HLD. Palliative Care was asked to help address goals of care.  CODE STATUS:DNR  PPS:50% HOSPICE ELIGIBILITY/DIAGNOSIS: TBD  PAST MEDICAL HISTORY:  Past Medical History:  Diagnosis Date  . Anemia   . Anxiety and depression    per med rec  . 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 Autism   . Chronic CHF (congestive heart failure) (HCC)    per med rec  . Chronic kidney disease (CKD)    stage 3 per chart in 09/2015  . Diabetes mellitus without complication ([place])   . Diabetic neuropathy ([place])   . History of thyroid nodule    nontoxic goiter per med rec  . Hodgkin disease ([place])    in 70  . HTN (hypertension), benign   . Hyperlipidemia   . Incontinence   . Lack of sensation   . Osteopenia    per med rec  . Poor balance   . Stroke ([place])   . Tubular adenoma of colon 01/2017  . Vitamin D deficiency     SOCIAL HX:  Social History   Tobacco Use  . Smoking status: Never Smoker  . Smokeless tobacco: Never Used  Substance Use Topics  . Alcohol use: No    ALLERGIES:  Allergies  Allergen Reactions  . Penicillins Rash    On MAR: Has patient had a PCN reaction causing immediate rash, facial/tongue/throat swelling, SOB or lightheadedness with hypotension: Yes Has patient had a PCN reaction causing severe rash involving mucus membranes or skin necrosis: Unk Has patient had a PCN reaction that required hospitalization: Unk Has patient had a PCN reaction occurring within the last 10 years: Unk If all of  the above answers are "NO", then may proceed with Cephalosporin use.      PERTINENT MEDICATIONS:  Outpatient Encounter Medications as of 09/05/2019  Medication Sig  . aspirin 81 MG tablet Take 1 tablet (81 mg total) by mouth daily.  Marland Kitchen atorvastatin (LIPITOR) 20 MG  tablet Take 1 tablet (20 mg total) by mouth every evening.  . calcium carbonate (TUMS EX) 750 MG chewable tablet Chew 1 tablet by mouth daily.  . Cholecalciferol (VITAMIN D3) 2000 UNITS TABS Take 1 tablet by mouth daily.   . Fe Fum-FePoly-Vit C-Vit B3 (INTEGRA PO) Take by mouth. 120-40-3mg  capsule,1cap po daily  . ferrous sulfate 325 (65 FE) MG tablet Take 325 mg by mouth daily.   . fesoterodine (TOVIAZ) 8 MG TB24 tablet TAKE 1 TAB BY MOUTH EVERY DAY.  DO NOT CRUSH OR CHEW . (Patient taking differently: Take 8 mg by mouth daily. )  . hydrALAZINE (APRESOLINE) 10 MG tablet Take 1 tablet (10 mg total) by mouth 3 (three) times daily.  . hydrALAZINE (APRESOLINE) 25 MG tablet Take 25 mg by mouth 3 (three) times daily as needed (for SBP 150 or greater and DBP 85 or greter).  . Infant Care Products (BABY SHAMPOO) SHAM Place 1 application into both eyes See admin instructions. Use to cleanse eyes and eyelids at bedtime   . magnesium oxide (MAG-OX) 400 MG tablet Take 400 mg by mouth every morning.   . metFORMIN (GLUCOPHAGE) 500 MG tablet Take 500 mg by mouth 2 (two) times daily with a meal.  . Multiple Vitamins-Minerals (CERTA-VITE PO) Take 1 tablet by mouth daily.   . Nutritional Supplements (GLUCERNA PO) Take 1 Can by mouth 2 (two) times daily.   Marland Kitchen omeprazole (PRILOSEC) 20 MG capsule Take 20 mg by mouth daily.   . Sodium Fluoride (PREVIDENT 5000 BOOSTER PLUS DT) Place 1 application onto teeth every evening.   . Stannous Fluoride (SENSODYNE COMPLETE PROTECTION MT) Use as directed in the mouth or throat. Mint,place one application onto teeth daily as directed  . verapamil (CALAN-SR) 120 MG CR tablet Take 120 mg by mouth at bedtime.   No facility-administered encounter medications on file as of 09/05/2019.    PHYSICAL EXAM/ROS:  General: NAD, cooperative Cardiovascular: regular rate and rhythm; denies chest pain Pulmonary: clear ant /post fields; lungs clear to auscultation bilaterally, no wheezing,  rales, rhonchi Abdomen: soft, nontender, + bowel sounds, no guarding GU: no suprapubic tenderness Extremities: no edema, no joint deformities Skin: no rashes to exposed skin Neurological: Weakness but otherwise nonfocal; alert and oriented x2  Teodoro Spray, NP

## 2019-09-17 ENCOUNTER — Other Ambulatory Visit: Payer: Self-pay

## 2019-09-17 ENCOUNTER — Emergency Department (HOSPITAL_COMMUNITY): Payer: Medicare Other

## 2019-09-17 ENCOUNTER — Emergency Department (HOSPITAL_COMMUNITY)
Admission: EM | Admit: 2019-09-17 | Discharge: 2019-09-17 | Disposition: A | Discharge status: Home / Self Care | Payer: Medicare Other | Attending: Emergency Medicine | Admitting: Emergency Medicine

## 2019-09-17 ENCOUNTER — Encounter (HOSPITAL_COMMUNITY): Payer: Self-pay | Admitting: Emergency Medicine

## 2019-09-17 DIAGNOSIS — R413 Other amnesia: Secondary | ICD-10-CM | POA: Diagnosis not present

## 2019-09-17 DIAGNOSIS — F840 Autistic disorder: Secondary | ICD-10-CM | POA: Insufficient documentation

## 2019-09-17 DIAGNOSIS — R55 Syncope and collapse: Secondary | ICD-10-CM | POA: Diagnosis not present

## 2019-09-17 DIAGNOSIS — I700 Atherosclerosis of aorta: Secondary | ICD-10-CM | POA: Diagnosis not present

## 2019-09-17 DIAGNOSIS — Z7984 Long term (current) use of oral hypoglycemic drugs: Secondary | ICD-10-CM | POA: Diagnosis not present

## 2019-09-17 DIAGNOSIS — N189 Chronic kidney disease, unspecified: Secondary | ICD-10-CM | POA: Diagnosis not present

## 2019-09-17 DIAGNOSIS — F0390 Unspecified dementia without behavioral disturbance: Secondary | ICD-10-CM | POA: Insufficient documentation

## 2019-09-17 DIAGNOSIS — E1122 Type 2 diabetes mellitus with diabetic chronic kidney disease: Secondary | ICD-10-CM | POA: Diagnosis not present

## 2019-09-17 DIAGNOSIS — I509 Heart failure, unspecified: Secondary | ICD-10-CM | POA: Diagnosis not present

## 2019-09-17 DIAGNOSIS — I130 Hypertensive heart and chronic kidney disease with heart failure and stage 1 through stage 4 chronic kidney disease, or unspecified chronic kidney disease: Secondary | ICD-10-CM | POA: Insufficient documentation

## 2019-09-17 DIAGNOSIS — S0990XA Unspecified injury of head, initial encounter: Secondary | ICD-10-CM | POA: Diagnosis not present

## 2019-09-17 DIAGNOSIS — Z79899 Other long term (current) drug therapy: Secondary | ICD-10-CM | POA: Diagnosis not present

## 2019-09-17 DIAGNOSIS — J9811 Atelectasis: Secondary | ICD-10-CM | POA: Diagnosis not present

## 2019-09-17 DIAGNOSIS — Z7982 Long term (current) use of aspirin: Secondary | ICD-10-CM | POA: Insufficient documentation

## 2019-09-17 LAB — CBG MONITORING, ED: Glucose-Capillary: 221 mg/dL — ABNORMAL HIGH (ref 70–99)

## 2019-09-17 LAB — URINALYSIS, ROUTINE W REFLEX MICROSCOPIC
Bacteria, UA: NONE SEEN
Bilirubin Urine: NEGATIVE
Glucose, UA: NEGATIVE mg/dL
Hgb urine dipstick: NEGATIVE
Ketones, ur: NEGATIVE mg/dL
Leukocytes,Ua: NEGATIVE
Nitrite: NEGATIVE
Protein, ur: 30 mg/dL — AB
Specific Gravity, Urine: 1.014 (ref 1.005–1.030)
pH: 6 (ref 5.0–8.0)

## 2019-09-17 LAB — BASIC METABOLIC PANEL
Anion gap: 13 (ref 5–15)
BUN: 25 mg/dL — ABNORMAL HIGH (ref 8–23)
CO2: 21 mmol/L — ABNORMAL LOW (ref 22–32)
Calcium: 9.5 mg/dL (ref 8.9–10.3)
Chloride: 103 mmol/L (ref 98–111)
Creatinine, Ser: 1.13 mg/dL — ABNORMAL HIGH (ref 0.44–1.00)
GFR calc Af Amer: 51 mL/min — ABNORMAL LOW (ref >60)
GFR calc non Af Amer: 44 mL/min — ABNORMAL LOW (ref >60)
Glucose, Bld: 262 mg/dL — ABNORMAL HIGH (ref 70–99)
Potassium: 5 mmol/L (ref 3.5–5.1)
Sodium: 137 mmol/L (ref 135–145)

## 2019-09-17 LAB — CBC
HCT: 42 % (ref 36.0–46.0)
Hemoglobin: 11.9 g/dL — ABNORMAL LOW (ref 12.0–15.0)
MCH: 24.7 pg — ABNORMAL LOW (ref 26.0–34.0)
MCHC: 28.3 g/dL — ABNORMAL LOW (ref 30.0–36.0)
MCV: 87.3 fL (ref 80.0–100.0)
Platelets: 305 10*3/uL (ref 150–400)
RBC: 4.81 MIL/uL (ref 3.87–5.11)
RDW: 16.1 % — ABNORMAL HIGH (ref 11.5–15.5)
WBC: 9.9 10*3/uL (ref 4.0–10.5)
nRBC: 0 % (ref 0.0–0.2)

## 2019-09-17 MED ORDER — SODIUM CHLORIDE 0.9% FLUSH: 3.0000 mL | Freq: Once | INTRAVENOUS | Status: DC

## 2019-09-17 NOTE — ED Notes (Signed)
Pt verbalizes understanding of DC instructions. Pt belongings returned and transported by [facility] to morning view.
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
## 2019-09-17 NOTE — ED Notes (Addendum)
Called [facility] for transport to [facility]. Paperwork and DNR at nurses station. Called Pts contact [name] X 3 without answer. Called facility for report nurse not available at this time will follow up.

## 2019-09-17 NOTE — Discharge Instructions (Signed)
Please follow-up with your primary care doctor. For now I recommend decreasing the blood pressure medication hydralazine that you are taking.  Instead of taking 3 times daily please take it twice daily.  You should follow-up closely with your primary care doctor in order to recheck blood pressure and make long-term plans to manage these low blood pressures and episodes of passing out. The laboratory work-up today was reassuring with normal blood work, urine and head CT.

## 2019-09-17 NOTE — ED Notes (Signed)
[facility] at bedside. Maylon Peppers Daughter [phone] no answer.

## 2019-09-17 NOTE — ED Triage Notes (Signed)
Patient here from home reporting syncopal episode today. Hx of same. Has caregiver. Denies n/v. Hypotensive initially.

## 2019-09-27 DIAGNOSIS — R41841 Cognitive communication deficit: Secondary | ICD-10-CM | POA: Diagnosis not present

## 2019-09-27 DIAGNOSIS — R2681 Unsteadiness on feet: Secondary | ICD-10-CM | POA: Diagnosis not present

## 2019-09-27 DIAGNOSIS — F848 Other pervasive developmental disorders: Secondary | ICD-10-CM | POA: Diagnosis not present

## 2019-09-27 DIAGNOSIS — E119 Type 2 diabetes mellitus without complications: Secondary | ICD-10-CM | POA: Diagnosis not present

## 2019-09-27 DIAGNOSIS — F0150 Vascular dementia without behavioral disturbance: Secondary | ICD-10-CM | POA: Diagnosis not present

## 2019-09-27 DIAGNOSIS — M6281 Muscle weakness (generalized): Secondary | ICD-10-CM | POA: Diagnosis not present

## 2019-09-27 DIAGNOSIS — R4189 Other symptoms and signs involving cognitive functions and awareness: Secondary | ICD-10-CM | POA: Diagnosis not present

## 2019-09-27 DIAGNOSIS — R2689 Other abnormalities of gait and mobility: Secondary | ICD-10-CM | POA: Diagnosis not present

## 2019-10-01 ENCOUNTER — Other Ambulatory Visit: Payer: Self-pay

## 2019-10-01 ENCOUNTER — Non-Acute Institutional Stay: Payer: Medicare Other | Admitting: Hospice

## 2019-10-01 DIAGNOSIS — M6281 Muscle weakness (generalized): Secondary | ICD-10-CM | POA: Diagnosis not present

## 2019-10-01 DIAGNOSIS — R41841 Cognitive communication deficit: Secondary | ICD-10-CM | POA: Diagnosis not present

## 2019-10-01 DIAGNOSIS — E119 Type 2 diabetes mellitus without complications: Secondary | ICD-10-CM | POA: Diagnosis not present

## 2019-10-01 DIAGNOSIS — R2681 Unsteadiness on feet: Secondary | ICD-10-CM | POA: Diagnosis not present

## 2019-10-01 DIAGNOSIS — F0390 Unspecified dementia without behavioral disturbance: Secondary | ICD-10-CM

## 2019-10-01 DIAGNOSIS — Z515 Encounter for palliative care: Secondary | ICD-10-CM

## 2019-10-01 DIAGNOSIS — R2689 Other abnormalities of gait and mobility: Secondary | ICD-10-CM | POA: Diagnosis not present

## 2019-10-01 DIAGNOSIS — R4189 Other symptoms and signs involving cognitive functions and awareness: Secondary | ICD-10-CM | POA: Diagnosis not present

## 2019-10-01 NOTE — Progress Notes (Signed)
[facility] Consult Note Telephone: [phone]  Fax: [phone]  PATIENT NAME:Lisa Villegas DOB:1930/07/06 ZYS:[id number]  PRIMARY CARE PROVIDER:Ramachandran, Mauro Kaufmann, MD  REFERRING PROVIDER:Ramachandran, Mauro Kaufmann, MD [address] [place], [place] [address]  [place] [phone]    RECOMMENDATIONS/PLAN: 1. Advance Care Planning/Goals of Care:Visit is to build trust and follow-up on file palliative care.  Visit consisted of counseling and education dealing with the complex and emotionally intense issues of symptom management and palliative care in the setting of serious and potentially life-threatening illness. Palliative care team will continue to support patient, patient's family, and medical team.  Goals include to maximize quality of life and symptom management.Patient remains aDNR.  2.Symptom Management:Patient in no medical acuity, denies pain/discomfort.  No coughing or respiratory distress. She was treated for pneumonia in May 2021and seen in the ED 08/30/2019 for altered mental status which resolved shortly; 09/17/2019 for near syncope which also resolved shortly.  EKG was without acute findings and labs within normal limits.  Memory loss related toDementiais ongoing and at baseline. FAST6c.Patient ambulates with her rolling walker. She requires assistance with ADLs. She continues with her PT.  Appetite is fair. Nursing with no concerns.Encouraged ongoingsupportive nursing care. 3. Follow up Palliative Care Visit: Palliative care will continue to follow for goals of care clarification and symptom management.  I spent48 minutes providing this consultation; time includes chart review and documentation. More than 50% of the time in this consultation was spent on coordinating Communication.  HISTORY OF PRESENT ILLNESS:Lisa Villegas a 86 y.o.year  oldfemalewith multiple medical problems including s/ CVA, dementia, gait disturbance, Autism, HTN, HLD. Palliative Care was asked to help address goals of care.  CODE STATUS:DNR  PPS:50% HOSPICE ELIGIBILITY/DIAGNOSIS: TBD  PAST MEDICAL HISTORY:  Past Medical History:  Diagnosis Date  . Anemia   . Anxiety and depression    per med rec  . 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 Autism   . Chronic CHF (congestive heart failure) (HCC)    per med rec  . Chronic kidney disease (CKD)    stage 3 per chart in 09/2015  . Diabetes mellitus without complication ([place])   . Diabetic neuropathy ([place])   . History of thyroid nodule    nontoxic goiter per med rec  . Hodgkin disease ([place])    in 20  . HTN (hypertension), benign   . Hyperlipidemia   . Incontinence   . Lack of sensation   . Osteopenia    per med rec  . Poor balance   . Stroke ([place])   . Tubular adenoma of colon 01/2017  . Vitamin D deficiency     SOCIAL HX:  Social History   Tobacco Use  . Smoking status: Never Smoker  . Smokeless tobacco: Never Used  Substance Use Topics  . Alcohol use: No    ALLERGIES:  Allergies  Allergen Reactions  . Penicillins Rash    On MAR: Has patient had a PCN reaction causing immediate rash, facial/tongue/throat swelling, SOB or lightheadedness with hypotension: Yes Has patient had a PCN reaction causing severe rash involving mucus membranes or skin necrosis: Unk Has patient had a PCN reaction that required hospitalization: Unk Has patient had a PCN reaction occurring within the last 10 years: Unk If all of the above answers are "NO", then may proceed with Cephalosporin use.      PERTINENT MEDICATIONS:  Outpatient Encounter Medications as of 10/01/2019  Medication Sig  . aspirin 81 MG tablet  Take 1 tablet (81 mg total) by mouth daily.  Marland Kitchen atorvastatin (LIPITOR) 20 MG tablet Take 1 tablet (20 mg total) by mouth every evening.  . calcium carbonate (TUMS EX) 750 MG chewable tablet Chew 1 tablet  by mouth daily.  . Cholecalciferol (VITAMIN D3) 2000 UNITS TABS Take 1 tablet by mouth daily.   . Fe Fum-FePoly-Vit C-Vit B3 (INTEGRA PO) Take by mouth. 120-40-3mg  capsule,1cap po daily  . ferrous sulfate 325 (65 FE) MG tablet Take 325 mg by mouth daily.   . fesoterodine (TOVIAZ) 8 MG TB24 tablet TAKE 1 TAB BY MOUTH EVERY DAY.  DO NOT CRUSH OR CHEW . (Patient taking differently: Take 8 mg by mouth daily. )  . hydrALAZINE (APRESOLINE) 10 MG tablet Take 1 tablet (10 mg total) by mouth 3 (three) times daily.  . hydrALAZINE (APRESOLINE) 25 MG tablet Take 25 mg by mouth 3 (three) times daily as needed (for SBP 150 or greater and DBP 85 or greter).  . Infant Care Products (BABY SHAMPOO) SHAM Place 1 application into both eyes See admin instructions. Use to cleanse eyes and eyelids at bedtime   . magnesium oxide (MAG-OX) 400 MG tablet Take 400 mg by mouth every morning.   . metFORMIN (GLUCOPHAGE) 500 MG tablet Take 500 mg by mouth 2 (two) times daily with a meal.  . Multiple Vitamins-Minerals (CERTA-VITE PO) Take 1 tablet by mouth daily.   . Nutritional Supplements (GLUCERNA PO) Take 1 Can by mouth 2 (two) times daily.   Marland Kitchen omeprazole (PRILOSEC) 20 MG capsule Take 20 mg by mouth daily.   . Sodium Fluoride (PREVIDENT 5000 BOOSTER PLUS DT) Place 1 application onto teeth every evening.   . Stannous Fluoride (SENSODYNE COMPLETE PROTECTION MT) Use as directed in the mouth or throat. Mint,place one application onto teeth daily as directed  . verapamil (CALAN-SR) 120 MG CR tablet Take 120 mg by mouth at bedtime.   No facility-administered encounter medications on file as of 10/01/2019.    PHYSICAL EXAM/ROS:  General: NAD, cooperative Cardiovascular: regular rate and rhythm; denies chest pain Pulmonary: clear ant /post fields; lungs clear to auscultation bilaterally, no wheezing, rales, rhonchi Abdomen: soft, nontender, + bowel sounds, no guarding GU: no suprapubic tenderness Extremities: no edema, no joint  deformities Skin: no rashes to exposed skin Neurological: Weakness but otherwise nonfocal; alert and oriented x2  Teodoro Spray, NP

## 2019-10-02 DIAGNOSIS — E119 Type 2 diabetes mellitus without complications: Secondary | ICD-10-CM | POA: Diagnosis not present

## 2019-10-02 DIAGNOSIS — R4189 Other symptoms and signs involving cognitive functions and awareness: Secondary | ICD-10-CM | POA: Diagnosis not present

## 2019-10-02 DIAGNOSIS — R2681 Unsteadiness on feet: Secondary | ICD-10-CM | POA: Diagnosis not present

## 2019-10-02 DIAGNOSIS — R41841 Cognitive communication deficit: Secondary | ICD-10-CM | POA: Diagnosis not present

## 2019-10-02 DIAGNOSIS — R2689 Other abnormalities of gait and mobility: Secondary | ICD-10-CM | POA: Diagnosis not present

## 2019-10-02 DIAGNOSIS — M6281 Muscle weakness (generalized): Secondary | ICD-10-CM | POA: Diagnosis not present

## 2019-10-04 DIAGNOSIS — R41841 Cognitive communication deficit: Secondary | ICD-10-CM | POA: Diagnosis not present

## 2019-10-04 DIAGNOSIS — R2681 Unsteadiness on feet: Secondary | ICD-10-CM | POA: Diagnosis not present

## 2019-10-04 DIAGNOSIS — M6281 Muscle weakness (generalized): Secondary | ICD-10-CM | POA: Diagnosis not present

## 2019-10-04 DIAGNOSIS — R2689 Other abnormalities of gait and mobility: Secondary | ICD-10-CM | POA: Diagnosis not present

## 2019-10-04 DIAGNOSIS — R4189 Other symptoms and signs involving cognitive functions and awareness: Secondary | ICD-10-CM | POA: Diagnosis not present

## 2019-10-04 DIAGNOSIS — E119 Type 2 diabetes mellitus without complications: Secondary | ICD-10-CM | POA: Diagnosis not present

## 2019-10-07 DIAGNOSIS — R2681 Unsteadiness on feet: Secondary | ICD-10-CM | POA: Diagnosis not present

## 2019-10-07 DIAGNOSIS — R4189 Other symptoms and signs involving cognitive functions and awareness: Secondary | ICD-10-CM | POA: Diagnosis not present

## 2019-10-07 DIAGNOSIS — M6281 Muscle weakness (generalized): Secondary | ICD-10-CM | POA: Diagnosis not present

## 2019-10-07 DIAGNOSIS — E119 Type 2 diabetes mellitus without complications: Secondary | ICD-10-CM | POA: Diagnosis not present

## 2019-10-07 DIAGNOSIS — R41841 Cognitive communication deficit: Secondary | ICD-10-CM | POA: Diagnosis not present

## 2019-10-07 DIAGNOSIS — R2689 Other abnormalities of gait and mobility: Secondary | ICD-10-CM | POA: Diagnosis not present

## 2019-10-09 ENCOUNTER — Ambulatory Visit (INDEPENDENT_AMBULATORY_CARE_PROVIDER_SITE_OTHER): Payer: Medicare Other | Admitting: Podiatry

## 2019-10-09 ENCOUNTER — Encounter: Payer: Self-pay | Admitting: Podiatry

## 2019-10-09 ENCOUNTER — Other Ambulatory Visit: Payer: Self-pay

## 2019-10-09 DIAGNOSIS — E1142 Type 2 diabetes mellitus with diabetic polyneuropathy: Secondary | ICD-10-CM

## 2019-10-09 DIAGNOSIS — M79674 Pain in right toe(s): Secondary | ICD-10-CM | POA: Diagnosis not present

## 2019-10-09 DIAGNOSIS — B351 Tinea unguium: Secondary | ICD-10-CM

## 2019-10-09 DIAGNOSIS — M79675 Pain in left toe(s): Secondary | ICD-10-CM | POA: Diagnosis not present

## 2019-10-09 NOTE — Progress Notes (Signed)
This patient returns to my office for at risk foot care.  This patient requires this care by a professional since this patient will be at risk due to having  Diabetic neuropathy and chronic kidney disease.  This patient is unable to cut nails herself since the patient cannot reach her nails.These nails are painful walking and wearing shoes.  This patient presents for at risk foot care today.She presents to the office with her POA.  General Appearance  Alert, conversant and in no acute stress.  Vascular  Dorsalis pedis and posterior tibial  pulses are palpable  bilaterally.  Capillary return is within normal limits  bilaterally. Temperature is within normal limits  bilaterally.  Neurologic  Senn-Weinstein monofilament wire test within normal limits  bilaterally. Muscle power within normal limits bilaterally.  Nails Thick disfigured discolored nails with subungual debris  from hallux to fifth toes bilaterally. No evidence of bacterial infection or drainage bilaterally.  Orthopedic  No limitations of motion  feet .  No crepitus or effusions noted.  No bony pathology or digital deformities noted.  Skin  normotropic skin with no porokeratosis noted bilaterally.  No signs of infections or ulcers noted.     Onychomycosis  Pain in right toes  Pain in left toes  Consent was obtained for treatment procedures.   Mechanical debridement of nails 1-5  bilaterally performed with a nail nipper.  Filed with dremel without incident.    Return office visit   3 months                   Told patient to return for periodic foot care and evaluation due to potential at risk complications.   Gardiner Barefoot DPM

## 2019-10-11 DIAGNOSIS — M6281 Muscle weakness (generalized): Secondary | ICD-10-CM | POA: Diagnosis not present

## 2019-10-11 DIAGNOSIS — R2689 Other abnormalities of gait and mobility: Secondary | ICD-10-CM | POA: Diagnosis not present

## 2019-10-11 DIAGNOSIS — R41841 Cognitive communication deficit: Secondary | ICD-10-CM | POA: Diagnosis not present

## 2019-10-11 DIAGNOSIS — R4189 Other symptoms and signs involving cognitive functions and awareness: Secondary | ICD-10-CM | POA: Diagnosis not present

## 2019-10-11 DIAGNOSIS — R2681 Unsteadiness on feet: Secondary | ICD-10-CM | POA: Diagnosis not present

## 2019-10-11 DIAGNOSIS — E119 Type 2 diabetes mellitus without complications: Secondary | ICD-10-CM | POA: Diagnosis not present

## 2019-10-12 DIAGNOSIS — R41841 Cognitive communication deficit: Secondary | ICD-10-CM | POA: Diagnosis not present

## 2019-10-12 DIAGNOSIS — E119 Type 2 diabetes mellitus without complications: Secondary | ICD-10-CM | POA: Diagnosis not present

## 2019-10-12 DIAGNOSIS — R2689 Other abnormalities of gait and mobility: Secondary | ICD-10-CM | POA: Diagnosis not present

## 2019-10-12 DIAGNOSIS — R2681 Unsteadiness on feet: Secondary | ICD-10-CM | POA: Diagnosis not present

## 2019-10-12 DIAGNOSIS — R4189 Other symptoms and signs involving cognitive functions and awareness: Secondary | ICD-10-CM | POA: Diagnosis not present

## 2019-10-12 DIAGNOSIS — M6281 Muscle weakness (generalized): Secondary | ICD-10-CM | POA: Diagnosis not present

## 2019-10-14 DIAGNOSIS — R4189 Other symptoms and signs involving cognitive functions and awareness: Secondary | ICD-10-CM | POA: Diagnosis not present

## 2019-10-14 DIAGNOSIS — R41841 Cognitive communication deficit: Secondary | ICD-10-CM | POA: Diagnosis not present

## 2019-10-14 DIAGNOSIS — E119 Type 2 diabetes mellitus without complications: Secondary | ICD-10-CM | POA: Diagnosis not present

## 2019-10-14 DIAGNOSIS — M6281 Muscle weakness (generalized): Secondary | ICD-10-CM | POA: Diagnosis not present

## 2019-10-14 DIAGNOSIS — R2689 Other abnormalities of gait and mobility: Secondary | ICD-10-CM | POA: Diagnosis not present

## 2019-10-14 DIAGNOSIS — R2681 Unsteadiness on feet: Secondary | ICD-10-CM | POA: Diagnosis not present

## 2019-10-15 DIAGNOSIS — R2689 Other abnormalities of gait and mobility: Secondary | ICD-10-CM | POA: Diagnosis not present

## 2019-10-15 DIAGNOSIS — R4189 Other symptoms and signs involving cognitive functions and awareness: Secondary | ICD-10-CM | POA: Diagnosis not present

## 2019-10-15 DIAGNOSIS — M6281 Muscle weakness (generalized): Secondary | ICD-10-CM | POA: Diagnosis not present

## 2019-10-15 DIAGNOSIS — R2681 Unsteadiness on feet: Secondary | ICD-10-CM | POA: Diagnosis not present

## 2019-10-15 DIAGNOSIS — R41841 Cognitive communication deficit: Secondary | ICD-10-CM | POA: Diagnosis not present

## 2019-10-15 DIAGNOSIS — E119 Type 2 diabetes mellitus without complications: Secondary | ICD-10-CM | POA: Diagnosis not present

## 2019-10-16 DIAGNOSIS — M6281 Muscle weakness (generalized): Secondary | ICD-10-CM | POA: Diagnosis not present

## 2019-10-16 DIAGNOSIS — R4189 Other symptoms and signs involving cognitive functions and awareness: Secondary | ICD-10-CM | POA: Diagnosis not present

## 2019-10-16 DIAGNOSIS — R2689 Other abnormalities of gait and mobility: Secondary | ICD-10-CM | POA: Diagnosis not present

## 2019-10-16 DIAGNOSIS — E119 Type 2 diabetes mellitus without complications: Secondary | ICD-10-CM | POA: Diagnosis not present

## 2019-10-16 DIAGNOSIS — R41841 Cognitive communication deficit: Secondary | ICD-10-CM | POA: Diagnosis not present

## 2019-10-16 DIAGNOSIS — R2681 Unsteadiness on feet: Secondary | ICD-10-CM | POA: Diagnosis not present

## 2019-10-17 DIAGNOSIS — R2681 Unsteadiness on feet: Secondary | ICD-10-CM | POA: Diagnosis not present

## 2019-10-17 DIAGNOSIS — R32 Unspecified urinary incontinence: Secondary | ICD-10-CM | POA: Diagnosis not present

## 2019-10-17 DIAGNOSIS — E119 Type 2 diabetes mellitus without complications: Secondary | ICD-10-CM | POA: Diagnosis not present

## 2019-10-17 DIAGNOSIS — E1122 Type 2 diabetes mellitus with diabetic chronic kidney disease: Secondary | ICD-10-CM | POA: Diagnosis not present

## 2019-10-17 DIAGNOSIS — E785 Hyperlipidemia, unspecified: Secondary | ICD-10-CM | POA: Diagnosis not present

## 2019-10-17 DIAGNOSIS — M6281 Muscle weakness (generalized): Secondary | ICD-10-CM | POA: Diagnosis not present

## 2019-10-17 DIAGNOSIS — R41841 Cognitive communication deficit: Secondary | ICD-10-CM | POA: Diagnosis not present

## 2019-10-17 DIAGNOSIS — R2689 Other abnormalities of gait and mobility: Secondary | ICD-10-CM | POA: Diagnosis not present

## 2019-10-17 DIAGNOSIS — R4189 Other symptoms and signs involving cognitive functions and awareness: Secondary | ICD-10-CM | POA: Diagnosis not present

## 2019-10-17 DIAGNOSIS — I129 Hypertensive chronic kidney disease with stage 1 through stage 4 chronic kidney disease, or unspecified chronic kidney disease: Secondary | ICD-10-CM | POA: Diagnosis not present

## 2019-10-17 DIAGNOSIS — D649 Anemia, unspecified: Secondary | ICD-10-CM | POA: Diagnosis not present

## 2019-10-18 DIAGNOSIS — E119 Type 2 diabetes mellitus without complications: Secondary | ICD-10-CM | POA: Diagnosis not present

## 2019-10-18 DIAGNOSIS — M6281 Muscle weakness (generalized): Secondary | ICD-10-CM | POA: Diagnosis not present

## 2019-10-18 DIAGNOSIS — R2689 Other abnormalities of gait and mobility: Secondary | ICD-10-CM | POA: Diagnosis not present

## 2019-10-18 DIAGNOSIS — R4189 Other symptoms and signs involving cognitive functions and awareness: Secondary | ICD-10-CM | POA: Diagnosis not present

## 2019-10-18 DIAGNOSIS — R41841 Cognitive communication deficit: Secondary | ICD-10-CM | POA: Diagnosis not present

## 2019-10-18 DIAGNOSIS — R2681 Unsteadiness on feet: Secondary | ICD-10-CM | POA: Diagnosis not present

## 2019-10-20 DIAGNOSIS — R2681 Unsteadiness on feet: Secondary | ICD-10-CM | POA: Diagnosis not present

## 2019-10-20 DIAGNOSIS — E119 Type 2 diabetes mellitus without complications: Secondary | ICD-10-CM | POA: Diagnosis not present

## 2019-10-20 DIAGNOSIS — R4189 Other symptoms and signs involving cognitive functions and awareness: Secondary | ICD-10-CM | POA: Diagnosis not present

## 2019-10-20 DIAGNOSIS — R41841 Cognitive communication deficit: Secondary | ICD-10-CM | POA: Diagnosis not present

## 2019-10-20 DIAGNOSIS — M6281 Muscle weakness (generalized): Secondary | ICD-10-CM | POA: Diagnosis not present

## 2019-10-20 DIAGNOSIS — R2689 Other abnormalities of gait and mobility: Secondary | ICD-10-CM | POA: Diagnosis not present

## 2019-10-21 DIAGNOSIS — M6281 Muscle weakness (generalized): Secondary | ICD-10-CM | POA: Diagnosis not present

## 2019-10-21 DIAGNOSIS — E119 Type 2 diabetes mellitus without complications: Secondary | ICD-10-CM | POA: Diagnosis not present

## 2019-10-21 DIAGNOSIS — R4189 Other symptoms and signs involving cognitive functions and awareness: Secondary | ICD-10-CM | POA: Diagnosis not present

## 2019-10-21 DIAGNOSIS — R41841 Cognitive communication deficit: Secondary | ICD-10-CM | POA: Diagnosis not present

## 2019-10-21 DIAGNOSIS — R2689 Other abnormalities of gait and mobility: Secondary | ICD-10-CM | POA: Diagnosis not present

## 2019-10-21 DIAGNOSIS — R2681 Unsteadiness on feet: Secondary | ICD-10-CM | POA: Diagnosis not present

## 2019-10-22 DIAGNOSIS — E119 Type 2 diabetes mellitus without complications: Secondary | ICD-10-CM | POA: Diagnosis not present

## 2019-10-22 DIAGNOSIS — R41841 Cognitive communication deficit: Secondary | ICD-10-CM | POA: Diagnosis not present

## 2019-10-22 DIAGNOSIS — R4189 Other symptoms and signs involving cognitive functions and awareness: Secondary | ICD-10-CM | POA: Diagnosis not present

## 2019-10-22 DIAGNOSIS — R2681 Unsteadiness on feet: Secondary | ICD-10-CM | POA: Diagnosis not present

## 2019-10-22 DIAGNOSIS — R2689 Other abnormalities of gait and mobility: Secondary | ICD-10-CM | POA: Diagnosis not present

## 2019-10-22 DIAGNOSIS — M6281 Muscle weakness (generalized): Secondary | ICD-10-CM | POA: Diagnosis not present

## 2019-10-24 DIAGNOSIS — R41841 Cognitive communication deficit: Secondary | ICD-10-CM | POA: Diagnosis not present

## 2019-10-24 DIAGNOSIS — E119 Type 2 diabetes mellitus without complications: Secondary | ICD-10-CM | POA: Diagnosis not present

## 2019-10-24 DIAGNOSIS — R2681 Unsteadiness on feet: Secondary | ICD-10-CM | POA: Diagnosis not present

## 2019-10-24 DIAGNOSIS — R2689 Other abnormalities of gait and mobility: Secondary | ICD-10-CM | POA: Diagnosis not present

## 2019-10-24 DIAGNOSIS — M6281 Muscle weakness (generalized): Secondary | ICD-10-CM | POA: Diagnosis not present

## 2019-10-24 DIAGNOSIS — R4189 Other symptoms and signs involving cognitive functions and awareness: Secondary | ICD-10-CM | POA: Diagnosis not present

## 2019-10-28 DIAGNOSIS — R2689 Other abnormalities of gait and mobility: Secondary | ICD-10-CM | POA: Diagnosis not present

## 2019-10-28 DIAGNOSIS — R4189 Other symptoms and signs involving cognitive functions and awareness: Secondary | ICD-10-CM | POA: Diagnosis not present

## 2019-10-28 DIAGNOSIS — M6281 Muscle weakness (generalized): Secondary | ICD-10-CM | POA: Diagnosis not present

## 2019-10-28 DIAGNOSIS — E119 Type 2 diabetes mellitus without complications: Secondary | ICD-10-CM | POA: Diagnosis not present

## 2019-10-28 DIAGNOSIS — R41841 Cognitive communication deficit: Secondary | ICD-10-CM | POA: Diagnosis not present

## 2019-10-28 DIAGNOSIS — R2681 Unsteadiness on feet: Secondary | ICD-10-CM | POA: Diagnosis not present

## 2019-10-28 DIAGNOSIS — F488 Other specified nonpsychotic mental disorders: Secondary | ICD-10-CM | POA: Diagnosis not present

## 2019-10-28 DIAGNOSIS — F0150 Vascular dementia without behavioral disturbance: Secondary | ICD-10-CM | POA: Diagnosis not present

## 2019-10-30 DIAGNOSIS — R41841 Cognitive communication deficit: Secondary | ICD-10-CM | POA: Diagnosis not present

## 2019-10-30 DIAGNOSIS — M6281 Muscle weakness (generalized): Secondary | ICD-10-CM | POA: Diagnosis not present

## 2019-10-30 DIAGNOSIS — R2689 Other abnormalities of gait and mobility: Secondary | ICD-10-CM | POA: Diagnosis not present

## 2019-10-30 DIAGNOSIS — E119 Type 2 diabetes mellitus without complications: Secondary | ICD-10-CM | POA: Diagnosis not present

## 2019-10-30 DIAGNOSIS — R2681 Unsteadiness on feet: Secondary | ICD-10-CM | POA: Diagnosis not present

## 2019-10-30 DIAGNOSIS — R4189 Other symptoms and signs involving cognitive functions and awareness: Secondary | ICD-10-CM | POA: Diagnosis not present

## 2019-11-01 DIAGNOSIS — E119 Type 2 diabetes mellitus without complications: Secondary | ICD-10-CM | POA: Diagnosis not present

## 2019-11-01 DIAGNOSIS — R4189 Other symptoms and signs involving cognitive functions and awareness: Secondary | ICD-10-CM | POA: Diagnosis not present

## 2019-11-01 DIAGNOSIS — R2681 Unsteadiness on feet: Secondary | ICD-10-CM | POA: Diagnosis not present

## 2019-11-01 DIAGNOSIS — R2689 Other abnormalities of gait and mobility: Secondary | ICD-10-CM | POA: Diagnosis not present

## 2019-11-01 DIAGNOSIS — R41841 Cognitive communication deficit: Secondary | ICD-10-CM | POA: Diagnosis not present

## 2019-11-01 DIAGNOSIS — M6281 Muscle weakness (generalized): Secondary | ICD-10-CM | POA: Diagnosis not present

## 2019-11-04 DIAGNOSIS — R4189 Other symptoms and signs involving cognitive functions and awareness: Secondary | ICD-10-CM | POA: Diagnosis not present

## 2019-11-04 DIAGNOSIS — R2681 Unsteadiness on feet: Secondary | ICD-10-CM | POA: Diagnosis not present

## 2019-11-04 DIAGNOSIS — R41841 Cognitive communication deficit: Secondary | ICD-10-CM | POA: Diagnosis not present

## 2019-11-04 DIAGNOSIS — M6281 Muscle weakness (generalized): Secondary | ICD-10-CM | POA: Diagnosis not present

## 2019-11-04 DIAGNOSIS — Z20828 Contact with and (suspected) exposure to other viral communicable diseases: Secondary | ICD-10-CM | POA: Diagnosis not present

## 2019-11-04 DIAGNOSIS — R2689 Other abnormalities of gait and mobility: Secondary | ICD-10-CM | POA: Diagnosis not present

## 2019-11-04 DIAGNOSIS — E119 Type 2 diabetes mellitus without complications: Secondary | ICD-10-CM | POA: Diagnosis not present

## 2019-11-07 DIAGNOSIS — R2681 Unsteadiness on feet: Secondary | ICD-10-CM | POA: Diagnosis not present

## 2019-11-07 DIAGNOSIS — R41841 Cognitive communication deficit: Secondary | ICD-10-CM | POA: Diagnosis not present

## 2019-11-07 DIAGNOSIS — M6281 Muscle weakness (generalized): Secondary | ICD-10-CM | POA: Diagnosis not present

## 2019-11-07 DIAGNOSIS — R4189 Other symptoms and signs involving cognitive functions and awareness: Secondary | ICD-10-CM | POA: Diagnosis not present

## 2019-11-07 DIAGNOSIS — E119 Type 2 diabetes mellitus without complications: Secondary | ICD-10-CM | POA: Diagnosis not present

## 2019-11-07 DIAGNOSIS — R2689 Other abnormalities of gait and mobility: Secondary | ICD-10-CM | POA: Diagnosis not present

## 2019-11-08 DIAGNOSIS — R4189 Other symptoms and signs involving cognitive functions and awareness: Secondary | ICD-10-CM | POA: Diagnosis not present

## 2019-11-08 DIAGNOSIS — R2689 Other abnormalities of gait and mobility: Secondary | ICD-10-CM | POA: Diagnosis not present

## 2019-11-08 DIAGNOSIS — M6281 Muscle weakness (generalized): Secondary | ICD-10-CM | POA: Diagnosis not present

## 2019-11-08 DIAGNOSIS — R2681 Unsteadiness on feet: Secondary | ICD-10-CM | POA: Diagnosis not present

## 2019-11-08 DIAGNOSIS — E119 Type 2 diabetes mellitus without complications: Secondary | ICD-10-CM | POA: Diagnosis not present

## 2019-11-08 DIAGNOSIS — R41841 Cognitive communication deficit: Secondary | ICD-10-CM | POA: Diagnosis not present

## 2019-11-11 DIAGNOSIS — Z20828 Contact with and (suspected) exposure to other viral communicable diseases: Secondary | ICD-10-CM | POA: Diagnosis not present

## 2019-11-12 DIAGNOSIS — Z20828 Contact with and (suspected) exposure to other viral communicable diseases: Secondary | ICD-10-CM | POA: Diagnosis not present

## 2019-11-14 DIAGNOSIS — R2689 Other abnormalities of gait and mobility: Secondary | ICD-10-CM | POA: Diagnosis not present

## 2019-11-14 DIAGNOSIS — R41841 Cognitive communication deficit: Secondary | ICD-10-CM | POA: Diagnosis not present

## 2019-11-14 DIAGNOSIS — R4189 Other symptoms and signs involving cognitive functions and awareness: Secondary | ICD-10-CM | POA: Diagnosis not present

## 2019-11-14 DIAGNOSIS — R2681 Unsteadiness on feet: Secondary | ICD-10-CM | POA: Diagnosis not present

## 2019-11-14 DIAGNOSIS — M6281 Muscle weakness (generalized): Secondary | ICD-10-CM | POA: Diagnosis not present

## 2019-11-14 DIAGNOSIS — E119 Type 2 diabetes mellitus without complications: Secondary | ICD-10-CM | POA: Diagnosis not present

## 2019-11-22 DIAGNOSIS — Z20828 Contact with and (suspected) exposure to other viral communicable diseases: Secondary | ICD-10-CM | POA: Diagnosis not present

## 2019-11-23 DIAGNOSIS — Z20828 Contact with and (suspected) exposure to other viral communicable diseases: Secondary | ICD-10-CM | POA: Diagnosis not present

## 2019-11-25 DIAGNOSIS — E119 Type 2 diabetes mellitus without complications: Secondary | ICD-10-CM | POA: Diagnosis not present

## 2019-11-25 DIAGNOSIS — M6281 Muscle weakness (generalized): Secondary | ICD-10-CM | POA: Diagnosis not present

## 2019-11-25 DIAGNOSIS — R41841 Cognitive communication deficit: Secondary | ICD-10-CM | POA: Diagnosis not present

## 2019-11-25 DIAGNOSIS — R2681 Unsteadiness on feet: Secondary | ICD-10-CM | POA: Diagnosis not present

## 2019-11-25 DIAGNOSIS — R4189 Other symptoms and signs involving cognitive functions and awareness: Secondary | ICD-10-CM | POA: Diagnosis not present

## 2019-11-25 DIAGNOSIS — R2689 Other abnormalities of gait and mobility: Secondary | ICD-10-CM | POA: Diagnosis not present

## 2019-11-26 DIAGNOSIS — R4189 Other symptoms and signs involving cognitive functions and awareness: Secondary | ICD-10-CM | POA: Diagnosis not present

## 2019-11-26 DIAGNOSIS — R41841 Cognitive communication deficit: Secondary | ICD-10-CM | POA: Diagnosis not present

## 2019-11-26 DIAGNOSIS — R2681 Unsteadiness on feet: Secondary | ICD-10-CM | POA: Diagnosis not present

## 2019-11-26 DIAGNOSIS — M6281 Muscle weakness (generalized): Secondary | ICD-10-CM | POA: Diagnosis not present

## 2019-11-26 DIAGNOSIS — E119 Type 2 diabetes mellitus without complications: Secondary | ICD-10-CM | POA: Diagnosis not present

## 2019-11-26 DIAGNOSIS — R2689 Other abnormalities of gait and mobility: Secondary | ICD-10-CM | POA: Diagnosis not present

## 2019-11-29 DIAGNOSIS — Z20828 Contact with and (suspected) exposure to other viral communicable diseases: Secondary | ICD-10-CM | POA: Diagnosis not present

## 2019-12-01 ENCOUNTER — Encounter (HOSPITAL_BASED_OUTPATIENT_CLINIC_OR_DEPARTMENT_OTHER): Payer: Self-pay | Admitting: Emergency Medicine

## 2019-12-01 ENCOUNTER — Emergency Department (HOSPITAL_BASED_OUTPATIENT_CLINIC_OR_DEPARTMENT_OTHER)
Admission: EM | Admit: 2019-12-01 | Discharge: 2019-12-01 | Disposition: A | Discharge status: Home / Self Care | Payer: Medicare Other | Source: Home / Self Care | Attending: Emergency Medicine | Admitting: Emergency Medicine

## 2019-12-01 ENCOUNTER — Other Ambulatory Visit: Payer: Self-pay

## 2019-12-01 DIAGNOSIS — N390 Urinary tract infection, site not specified: Secondary | ICD-10-CM | POA: Insufficient documentation

## 2019-12-01 DIAGNOSIS — E1165 Type 2 diabetes mellitus with hyperglycemia: Secondary | ICD-10-CM | POA: Insufficient documentation

## 2019-12-01 DIAGNOSIS — I130 Hypertensive heart and chronic kidney disease with heart failure and stage 1 through stage 4 chronic kidney disease, or unspecified chronic kidney disease: Secondary | ICD-10-CM | POA: Insufficient documentation

## 2019-12-01 DIAGNOSIS — Z79899 Other long term (current) drug therapy: Secondary | ICD-10-CM | POA: Insufficient documentation

## 2019-12-01 DIAGNOSIS — Z7984 Long term (current) use of oral hypoglycemic drugs: Secondary | ICD-10-CM | POA: Insufficient documentation

## 2019-12-01 DIAGNOSIS — N183 Chronic kidney disease, stage 3 unspecified: Secondary | ICD-10-CM | POA: Insufficient documentation

## 2019-12-01 DIAGNOSIS — I10 Essential (primary) hypertension: Secondary | ICD-10-CM | POA: Diagnosis not present

## 2019-12-01 DIAGNOSIS — Z7982 Long term (current) use of aspirin: Secondary | ICD-10-CM | POA: Insufficient documentation

## 2019-12-01 DIAGNOSIS — I509 Heart failure, unspecified: Secondary | ICD-10-CM | POA: Insufficient documentation

## 2019-12-01 DIAGNOSIS — R739 Hyperglycemia, unspecified: Secondary | ICD-10-CM

## 2019-12-01 LAB — COMPREHENSIVE METABOLIC PANEL
ALT: 14 U/L (ref 0–44)
AST: 11 U/L — ABNORMAL LOW (ref 15–41)
Albumin: 3.4 g/dL — ABNORMAL LOW (ref 3.5–5.0)
Alkaline Phosphatase: 106 U/L (ref 38–126)
Anion gap: 10 (ref 5–15)
BUN: 24 mg/dL — ABNORMAL HIGH (ref 8–23)
CO2: 24 mmol/L (ref 22–32)
Calcium: 9.2 mg/dL (ref 8.9–10.3)
Chloride: 101 mmol/L (ref 98–111)
Creatinine, Ser: 1.01 mg/dL — ABNORMAL HIGH (ref 0.44–1.00)
GFR calc Af Amer: 58 mL/min — ABNORMAL LOW (ref >60)
GFR calc non Af Amer: 50 mL/min — ABNORMAL LOW (ref >60)
Glucose, Bld: 332 mg/dL — ABNORMAL HIGH (ref 70–99)
Potassium: 4.4 mmol/L (ref 3.5–5.1)
Sodium: 135 mmol/L (ref 135–145)
Total Bilirubin: 0.1 mg/dL — ABNORMAL LOW (ref 0.3–1.2)
Total Protein: 7.2 g/dL (ref 6.5–8.1)

## 2019-12-01 LAB — URINALYSIS, MICROSCOPIC (REFLEX): WBC, UA: >50 WBC/hpf (ref 0–5)

## 2019-12-01 LAB — CBC WITH DIFFERENTIAL/PLATELET
Abs Immature Granulocytes: 0.05 10*3/uL (ref 0.00–0.07)
Basophils Absolute: 0.1 10*3/uL (ref 0.0–0.1)
Basophils Relative: 1 %
Eosinophils Absolute: 0.1 10*3/uL (ref 0.0–0.5)
Eosinophils Relative: 2 %
HCT: 36.8 % (ref 36.0–46.0)
Hemoglobin: 11.1 g/dL — ABNORMAL LOW (ref 12.0–15.0)
Immature Granulocytes: 1 %
Lymphocytes Relative: 28 %
Lymphs Abs: 2.2 10*3/uL (ref 0.7–4.0)
MCH: 24.7 pg — ABNORMAL LOW (ref 26.0–34.0)
MCHC: 30.2 g/dL (ref 30.0–36.0)
MCV: 81.8 fL (ref 80.0–100.0)
Monocytes Absolute: 0.7 10*3/uL (ref 0.1–1.0)
Monocytes Relative: 9 %
Neutro Abs: 4.8 10*3/uL (ref 1.7–7.7)
Neutrophils Relative %: 59 %
Platelets: 368 10*3/uL (ref 150–400)
RBC: 4.5 MIL/uL (ref 3.87–5.11)
RDW: 17 % — ABNORMAL HIGH (ref 11.5–15.5)
WBC: 8 10*3/uL (ref 4.0–10.5)
nRBC: 0 % (ref 0.0–0.2)

## 2019-12-01 LAB — URINALYSIS, ROUTINE W REFLEX MICROSCOPIC
Bilirubin Urine: NEGATIVE
Glucose, UA: >=500 mg/dL — AB
Ketones, ur: NEGATIVE mg/dL
Nitrite: NEGATIVE
Protein, ur: 30 mg/dL — AB
Specific Gravity, Urine: 1.02 (ref 1.005–1.030)
pH: 8 (ref 5.0–8.0)

## 2019-12-01 LAB — CBG MONITORING, ED
Glucose-Capillary: 314 mg/dL — ABNORMAL HIGH (ref 70–99)
Glucose-Capillary: 257 mg/dL — ABNORMAL HIGH (ref 70–99)

## 2019-12-01 MED ORDER — CEPHALEXIN 500 MG PO CAPS: 500.0000 mg | ORAL_CAPSULE | Freq: Three times a day (TID) | ORAL | 0 refills | Status: DC

## 2019-12-01 MED ORDER — SODIUM CHLORIDE 0.9 % IV BOLUS
250.0000 mL | Freq: Once | INTRAVENOUS | Status: AC
  Administered 2019-12-01: 250 mL via INTRAVENOUS

## 2019-12-01 MED ORDER — METFORMIN HCL 500 MG PO TABS: 500.0000 mg | ORAL_TABLET | ORAL | 0 refills | Status: DC

## 2019-12-01 NOTE — ED Notes (Signed)
Discussed with POA and pt that a urine specimen was needed. She is incontinent so an external catheter was placed. We discussed that an in and out catheter may be needed at some point if the external catheter did not yield any output.

## 2019-12-01 NOTE — ED Triage Notes (Signed)
Pt here with reports of weight loss, hyperglycemia with no insulin at the assisted living in triage. POA is here and states that she's been very sedentary and non responsive.

## 2019-12-01 NOTE — ED Notes (Signed)
CBG 257
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
## 2019-12-02 LAB — URINE CULTURE

## 2019-12-04 ENCOUNTER — Inpatient Hospital Stay (HOSPITAL_COMMUNITY): Payer: Medicare Other

## 2019-12-04 ENCOUNTER — Inpatient Hospital Stay (HOSPITAL_COMMUNITY)
Admission: EM | Admit: 2019-12-04 | Discharge: 2019-12-06 | DRG: 638 | Disposition: A | Discharge status: Nursing Home (Includes ICF) | Payer: Medicare Other | Attending: Internal Medicine | Admitting: Internal Medicine

## 2019-12-04 ENCOUNTER — Other Ambulatory Visit: Payer: Self-pay

## 2019-12-04 DIAGNOSIS — I130 Hypertensive heart and chronic kidney disease with heart failure and stage 1 through stage 4 chronic kidney disease, or unspecified chronic kidney disease: Secondary | ICD-10-CM | POA: Diagnosis present

## 2019-12-04 DIAGNOSIS — Z7982 Long term (current) use of aspirin: Secondary | ICD-10-CM

## 2019-12-04 DIAGNOSIS — Z90710 Acquired absence of both cervix and uterus: Secondary | ICD-10-CM

## 2019-12-04 DIAGNOSIS — E1165 Type 2 diabetes mellitus with hyperglycemia: Secondary | ICD-10-CM | POA: Diagnosis not present

## 2019-12-04 DIAGNOSIS — Z8249 Family history of ischemic heart disease and other diseases of the circulatory system: Secondary | ICD-10-CM

## 2019-12-04 DIAGNOSIS — N390 Urinary tract infection, site not specified: Secondary | ICD-10-CM | POA: Diagnosis present

## 2019-12-04 DIAGNOSIS — N1831 Chronic kidney disease, stage 3a: Secondary | ICD-10-CM | POA: Diagnosis present

## 2019-12-04 DIAGNOSIS — E860 Dehydration: Secondary | ICD-10-CM | POA: Diagnosis present

## 2019-12-04 DIAGNOSIS — E1122 Type 2 diabetes mellitus with diabetic chronic kidney disease: Secondary | ICD-10-CM | POA: Diagnosis present

## 2019-12-04 DIAGNOSIS — F840 Autistic disorder: Secondary | ICD-10-CM | POA: Diagnosis present

## 2019-12-04 DIAGNOSIS — E1140 Type 2 diabetes mellitus with diabetic neuropathy, unspecified: Secondary | ICD-10-CM | POA: Diagnosis present

## 2019-12-04 DIAGNOSIS — I509 Heart failure, unspecified: Secondary | ICD-10-CM

## 2019-12-04 DIAGNOSIS — Z20822 Contact with and (suspected) exposure to covid-19: Secondary | ICD-10-CM | POA: Diagnosis present

## 2019-12-04 DIAGNOSIS — E876 Hypokalemia: Secondary | ICD-10-CM | POA: Diagnosis present

## 2019-12-04 DIAGNOSIS — D75839 Thrombocytosis, unspecified: Secondary | ICD-10-CM | POA: Diagnosis present

## 2019-12-04 DIAGNOSIS — F329 Major depressive disorder, single episode, unspecified: Secondary | ICD-10-CM | POA: Diagnosis present

## 2019-12-04 DIAGNOSIS — E1110 Type 2 diabetes mellitus with ketoacidosis without coma: Secondary | ICD-10-CM | POA: Diagnosis present

## 2019-12-04 DIAGNOSIS — R627 Adult failure to thrive: Secondary | ICD-10-CM | POA: Diagnosis present

## 2019-12-04 DIAGNOSIS — Z8571 Personal history of Hodgkin lymphoma: Secondary | ICD-10-CM

## 2019-12-04 DIAGNOSIS — Z66 Do not resuscitate: Secondary | ICD-10-CM | POA: Diagnosis present

## 2019-12-04 DIAGNOSIS — N1832 Chronic kidney disease, stage 3b: Secondary | ICD-10-CM | POA: Diagnosis not present

## 2019-12-04 DIAGNOSIS — K219 Gastro-esophageal reflux disease without esophagitis: Secondary | ICD-10-CM | POA: Diagnosis present

## 2019-12-04 DIAGNOSIS — I10 Essential (primary) hypertension: Secondary | ICD-10-CM | POA: Diagnosis present

## 2019-12-04 DIAGNOSIS — Z880 Allergy status to penicillin: Secondary | ICD-10-CM

## 2019-12-04 DIAGNOSIS — Z79899 Other long term (current) drug therapy: Secondary | ICD-10-CM

## 2019-12-04 DIAGNOSIS — R000 Tachycardia, unspecified: Secondary | ICD-10-CM | POA: Diagnosis not present

## 2019-12-04 DIAGNOSIS — Z8673 Personal history of transient ischemic attack (TIA), and cerebral infarction without residual deficits: Secondary | ICD-10-CM

## 2019-12-04 DIAGNOSIS — F419 Anxiety disorder, unspecified: Secondary | ICD-10-CM | POA: Diagnosis present

## 2019-12-04 DIAGNOSIS — N179 Acute kidney failure, unspecified: Secondary | ICD-10-CM | POA: Diagnosis present

## 2019-12-04 DIAGNOSIS — E785 Hyperlipidemia, unspecified: Secondary | ICD-10-CM | POA: Diagnosis present

## 2019-12-04 DIAGNOSIS — I639 Cerebral infarction, unspecified: Secondary | ICD-10-CM | POA: Diagnosis present

## 2019-12-04 DIAGNOSIS — N189 Chronic kidney disease, unspecified: Secondary | ICD-10-CM | POA: Diagnosis present

## 2019-12-04 DIAGNOSIS — R7989 Other specified abnormal findings of blood chemistry: Secondary | ICD-10-CM | POA: Diagnosis present

## 2019-12-04 DIAGNOSIS — Z7984 Long term (current) use of oral hypoglycemic drugs: Secondary | ICD-10-CM

## 2019-12-04 DIAGNOSIS — I5032 Chronic diastolic (congestive) heart failure: Secondary | ICD-10-CM | POA: Diagnosis present

## 2019-12-04 DIAGNOSIS — M8580 Other specified disorders of bone density and structure, unspecified site: Secondary | ICD-10-CM | POA: Diagnosis present

## 2019-12-04 DIAGNOSIS — R918 Other nonspecific abnormal finding of lung field: Secondary | ICD-10-CM | POA: Diagnosis not present

## 2019-12-04 DIAGNOSIS — R0689 Other abnormalities of breathing: Secondary | ICD-10-CM | POA: Diagnosis not present

## 2019-12-04 DIAGNOSIS — R404 Transient alteration of awareness: Secondary | ICD-10-CM | POA: Diagnosis not present

## 2019-12-04 LAB — URINALYSIS, ROUTINE W REFLEX MICROSCOPIC
Bacteria, UA: NONE SEEN
Bilirubin Urine: NEGATIVE
Glucose, UA: >=500 mg/dL — AB
Ketones, ur: 20 mg/dL — AB
Nitrite: NEGATIVE
Protein, ur: 100 mg/dL — AB
Specific Gravity, Urine: 1.026 (ref 1.005–1.030)
pH: 5 (ref 5.0–8.0)

## 2019-12-04 LAB — CBC WITH DIFFERENTIAL/PLATELET
Abs Immature Granulocytes: 0.09 10*3/uL — ABNORMAL HIGH (ref 0.00–0.07)
Basophils Absolute: 0 10*3/uL (ref 0.0–0.1)
Basophils Relative: 0 %
Eosinophils Absolute: 0 10*3/uL (ref 0.0–0.5)
Eosinophils Relative: 0 %
HCT: 43.1 % (ref 36.0–46.0)
Hemoglobin: 13.2 g/dL (ref 12.0–15.0)
Immature Granulocytes: 1 %
Lymphocytes Relative: 12 %
Lymphs Abs: 1.9 10*3/uL (ref 0.7–4.0)
MCH: 24.6 pg — ABNORMAL LOW (ref 26.0–34.0)
MCHC: 30.6 g/dL (ref 30.0–36.0)
MCV: 80.4 fL (ref 80.0–100.0)
Monocytes Absolute: 0.7 10*3/uL (ref 0.1–1.0)
Monocytes Relative: 4 %
Neutro Abs: 12.6 10*3/uL — ABNORMAL HIGH (ref 1.7–7.7)
Neutrophils Relative %: 83 %
Platelets: 501 10*3/uL — ABNORMAL HIGH (ref 150–400)
RBC: 5.36 MIL/uL — ABNORMAL HIGH (ref 3.87–5.11)
RDW: 17.4 % — ABNORMAL HIGH (ref 11.5–15.5)
WBC: 15.3 10*3/uL — ABNORMAL HIGH (ref 4.0–10.5)
nRBC: 0 % (ref 0.0–0.2)

## 2019-12-04 LAB — CBC
HCT: 40.7 % (ref 36.0–46.0)
Hemoglobin: 12 g/dL (ref 12.0–15.0)
MCH: 24.3 pg — ABNORMAL LOW (ref 26.0–34.0)
MCHC: 29.5 g/dL — ABNORMAL LOW (ref 30.0–36.0)
MCV: 82.4 fL (ref 80.0–100.0)
Platelets: 385 10*3/uL (ref 150–400)
RBC: 4.94 MIL/uL (ref 3.87–5.11)
RDW: 17.2 % — ABNORMAL HIGH (ref 11.5–15.5)
WBC: 15.8 10*3/uL — ABNORMAL HIGH (ref 4.0–10.5)
nRBC: 0 % (ref 0.0–0.2)

## 2019-12-04 LAB — I-STAT VENOUS BLOOD GAS, ED
Acid-base deficit: 2 mmol/L (ref 0.0–2.0)
Bicarbonate: 20.6 mmol/L (ref 20.0–28.0)
Calcium, Ion: 1.1 mmol/L — ABNORMAL LOW (ref 1.15–1.40)
HCT: 38 % (ref 36.0–46.0)
Hemoglobin: 12.9 g/dL (ref 12.0–15.0)
O2 Saturation: 94 %
Potassium: 4.1 mmol/L (ref 3.5–5.1)
Sodium: 142 mmol/L (ref 135–145)
TCO2: 21 mmol/L — ABNORMAL LOW (ref 22–32)
pCO2, Ven: 27.5 mmHg — ABNORMAL LOW (ref 44.0–60.0)
pH, Ven: 7.481 — ABNORMAL HIGH (ref 7.250–7.430)
pO2, Ven: 65 mmHg — ABNORMAL HIGH (ref 32.0–45.0)

## 2019-12-04 LAB — BASIC METABOLIC PANEL
Anion gap: 16 — ABNORMAL HIGH (ref 5–15)
Anion gap: 14 (ref 5–15)
Anion gap: 11 (ref 5–15)
BUN: 39 mg/dL — ABNORMAL HIGH (ref 8–23)
BUN: 33 mg/dL — ABNORMAL HIGH (ref 8–23)
BUN: 28 mg/dL — ABNORMAL HIGH (ref 8–23)
CO2: 22 mmol/L (ref 22–32)
CO2: 23 mmol/L (ref 22–32)
CO2: 23 mmol/L (ref 22–32)
Calcium: 9.8 mg/dL (ref 8.9–10.3)
Calcium: 9.3 mg/dL (ref 8.9–10.3)
Calcium: 8.9 mg/dL (ref 8.9–10.3)
Chloride: 105 mmol/L (ref 98–111)
Chloride: 106 mmol/L (ref 98–111)
Chloride: 105 mmol/L (ref 98–111)
Creatinine, Ser: 1.17 mg/dL — ABNORMAL HIGH (ref 0.44–1.00)
Creatinine, Ser: 1.01 mg/dL — ABNORMAL HIGH (ref 0.44–1.00)
Creatinine, Ser: 0.84 mg/dL (ref 0.44–1.00)
GFR calc Af Amer: 49 mL/min — ABNORMAL LOW (ref >60)
GFR calc Af Amer: 58 mL/min — ABNORMAL LOW (ref >60)
GFR calc Af Amer: >60 mL/min (ref >60)
GFR calc non Af Amer: 42 mL/min — ABNORMAL LOW (ref >60)
GFR calc non Af Amer: 50 mL/min — ABNORMAL LOW (ref >60)
GFR calc non Af Amer: >60 mL/min (ref >60)
Glucose, Bld: 222 mg/dL — ABNORMAL HIGH (ref 70–99)
Glucose, Bld: 181 mg/dL — ABNORMAL HIGH (ref 70–99)
Glucose, Bld: 151 mg/dL — ABNORMAL HIGH (ref 70–99)
Potassium: 4.1 mmol/L (ref 3.5–5.1)
Potassium: 3.6 mmol/L (ref 3.5–5.1)
Potassium: 3.3 mmol/L — ABNORMAL LOW (ref 3.5–5.1)
Sodium: 143 mmol/L (ref 135–145)
Sodium: 143 mmol/L (ref 135–145)
Sodium: 139 mmol/L (ref 135–145)

## 2019-12-04 LAB — PHOSPHORUS: Phosphorus: 3.1 mg/dL (ref 2.5–4.6)

## 2019-12-04 LAB — CBG MONITORING, ED
Glucose-Capillary: 459 mg/dL — ABNORMAL HIGH (ref 70–99)
Glucose-Capillary: 444 mg/dL — ABNORMAL HIGH (ref 70–99)
Glucose-Capillary: 258 mg/dL — ABNORMAL HIGH (ref 70–99)
Glucose-Capillary: 172 mg/dL — ABNORMAL HIGH (ref 70–99)
Glucose-Capillary: 174 mg/dL — ABNORMAL HIGH (ref 70–99)
Glucose-Capillary: 199 mg/dL — ABNORMAL HIGH (ref 70–99)
Glucose-Capillary: 192 mg/dL — ABNORMAL HIGH (ref 70–99)
Glucose-Capillary: 170 mg/dL — ABNORMAL HIGH (ref 70–99)
Glucose-Capillary: 142 mg/dL — ABNORMAL HIGH (ref 70–99)
Glucose-Capillary: 137 mg/dL — ABNORMAL HIGH (ref 70–99)

## 2019-12-04 LAB — COMPREHENSIVE METABOLIC PANEL
ALT: 33 U/L (ref 0–44)
AST: 78 U/L — ABNORMAL HIGH (ref 15–41)
Albumin: 4 g/dL (ref 3.5–5.0)
Alkaline Phosphatase: 114 U/L (ref 38–126)
Anion gap: 22 — ABNORMAL HIGH (ref 5–15)
BUN: 50 mg/dL — ABNORMAL HIGH (ref 8–23)
CO2: 18 mmol/L — ABNORMAL LOW (ref 22–32)
Calcium: 10.7 mg/dL — ABNORMAL HIGH (ref 8.9–10.3)
Chloride: 99 mmol/L (ref 98–111)
Creatinine, Ser: 1.44 mg/dL — ABNORMAL HIGH (ref 0.44–1.00)
GFR calc Af Amer: 38 mL/min — ABNORMAL LOW (ref >60)
GFR calc non Af Amer: 33 mL/min — ABNORMAL LOW (ref >60)
Glucose, Bld: 523 mg/dL (ref 70–99)
Potassium: 5 mmol/L (ref 3.5–5.1)
Sodium: 139 mmol/L (ref 135–145)
Total Bilirubin: 0.5 mg/dL (ref 0.3–1.2)
Total Protein: 8.3 g/dL — ABNORMAL HIGH (ref 6.5–8.1)

## 2019-12-04 LAB — BETA-HYDROXYBUTYRIC ACID
Beta-Hydroxybutyric Acid: 3.79 mmol/L — ABNORMAL HIGH (ref 0.05–0.27)
Beta-Hydroxybutyric Acid: 1.36 mmol/L — ABNORMAL HIGH (ref 0.05–0.27)
Beta-Hydroxybutyric Acid: 0.16 mmol/L (ref 0.05–0.27)

## 2019-12-04 LAB — HEMOGLOBIN A1C
Hgb A1c MFr Bld: 9.9 % — ABNORMAL HIGH (ref 4.8–5.6)
Mean Plasma Glucose: 237.43 mg/dL

## 2019-12-04 LAB — MAGNESIUM: Magnesium: 1.9 mg/dL (ref 1.7–2.4)

## 2019-12-04 LAB — SARS CORONAVIRUS 2 BY RT PCR (HOSPITAL ORDER, PERFORMED IN ~~LOC~~ HOSPITAL LAB): SARS Coronavirus 2: NEGATIVE

## 2019-12-04 LAB — LACTIC ACID, PLASMA
Lactic Acid, Venous: 3.1 mmol/L (ref 0.5–1.9)
Lactic Acid, Venous: 3.9 mmol/L (ref 0.5–1.9)

## 2019-12-04 MED ORDER — INSULIN GLARGINE 100 UNIT/ML ~~LOC~~ SOLN
15.0000 [IU] | Freq: Every day | SUBCUTANEOUS | Status: DC
  Administered 2019-12-05 (×2): 15 [IU] via SUBCUTANEOUS
  Filled 2019-12-04 (×4): qty 0.15

## 2019-12-04 MED ORDER — LACTATED RINGERS IV SOLN: INTRAVENOUS | Status: DC

## 2019-12-04 MED ORDER — SODIUM CHLORIDE 0.9 % IV SOLN
1.0000 g | INTRAVENOUS | Status: DC
  Administered 2019-12-04 – 2019-12-05 (×2): 1 g via INTRAVENOUS
  Filled 2019-12-04 (×2): qty 10

## 2019-12-04 MED ORDER — LACTATED RINGERS IV BOLUS
1000.0000 mL | Freq: Once | INTRAVENOUS | Status: AC
  Administered 2019-12-04: 1000 mL via INTRAVENOUS

## 2019-12-04 MED ORDER — POTASSIUM CHLORIDE 10 MEQ/100ML IV SOLN: 10.0000 meq | INTRAVENOUS | Status: DC

## 2019-12-04 MED ORDER — HYDRALAZINE HCL 20 MG/ML IJ SOLN
10.0000 mg | Freq: Four times a day (QID) | INTRAMUSCULAR | Status: DC | PRN
  Administered 2019-12-05: 10 mg via INTRAVENOUS
  Filled 2019-12-04: qty 1

## 2019-12-04 MED ORDER — DEXTROSE 50 % IV SOLN: 0.0000 mL | INTRAVENOUS | Status: DC | PRN

## 2019-12-04 MED ORDER — POTASSIUM CHLORIDE 10 MEQ/100ML IV SOLN
10.0000 meq | INTRAVENOUS | Status: AC
  Administered 2019-12-04 (×2): 10 meq via INTRAVENOUS
  Filled 2019-12-04 (×2): qty 100

## 2019-12-04 MED ORDER — INSULIN REGULAR(HUMAN) IN NACL 100-0.9 UT/100ML-% IV SOLN: INTRAVENOUS | Status: DC

## 2019-12-04 MED ORDER — ENOXAPARIN SODIUM 30 MG/0.3ML ~~LOC~~ SOLN
30.0000 mg | SUBCUTANEOUS | Status: DC
  Administered 2019-12-04 – 2019-12-05 (×2): 30 mg via SUBCUTANEOUS
  Filled 2019-12-04 (×2): qty 0.3

## 2019-12-04 MED ORDER — INSULIN ASPART 100 UNIT/ML ~~LOC~~ SOLN
0.0000 [IU] | Freq: Three times a day (TID) | SUBCUTANEOUS | Status: DC
  Administered 2019-12-05 (×2): 5 [IU] via SUBCUTANEOUS
  Administered 2019-12-05: 3 [IU] via SUBCUTANEOUS
  Administered 2019-12-06: 11 [IU] via SUBCUTANEOUS
  Administered 2019-12-06: 2 [IU] via SUBCUTANEOUS

## 2019-12-04 MED ORDER — INSULIN REGULAR(HUMAN) IN NACL 100-0.9 UT/100ML-% IV SOLN
INTRAVENOUS | Status: DC
  Administered 2019-12-04: 9.5 [IU]/h via INTRAVENOUS
  Filled 2019-12-04: qty 100

## 2019-12-04 MED ORDER — DEXTROSE IN LACTATED RINGERS 5 % IV SOLN: INTRAVENOUS | Status: DC

## 2019-12-04 NOTE — ED Notes (Signed)
pts eyes open no verbal response

## 2019-12-04 NOTE — Significant Event (Signed)
HOSPITAL MEDICINE OVERNIGHT EVENT NOTE    Notified by nursing she has received ENDOTOOL alert that patient is to be transitioned off of insulin infusion.  Chart reviewed.  Gap is closed and beta-hydroxybutyrate level normalized.  Patient not on scheduled basal insulin at home - will place on approx 0.3 units per kg of basal insulin.  Cardiac/Diabetic diet additionally started.  Insulin infusion to be stopped 2 hrs after administration of basal insulin administration.   I  Initiating accuchecks QAC and QHS with sliding scale insulin.  Vernelle Emerald  MD [facility]

## 2019-12-04 NOTE — ED Notes (Signed)
The pt is sleeping 
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
## 2019-12-04 NOTE — H&P (Signed)
History and Physical    Lisa Villegas CZY:[id number] DOB: 04/27/1930 DOA: 12/04/2019  PCP: [name], MD  [facility]  I have personally briefly reviewed patient's old medical records in [place]  Chief Complaint: Elevated blood sugar and confusion  HPI: [place] Villegas is a 86 y.o. female with medical history significant of type 2 diabetes mellitus, autism spectrum disorder, history of CVA, chronic diastolic CHF with preserved ejection fraction and grade 2 diastolic dysfunction, CKD stage III, hypertension, hyperlipidemia brought by EMS to the emergency department for the evaluation of hyperglycemia.  Patient is not very good historian.  History gathered from charts.  Apparently patient had recent evaluation in ED on 12/01/2019 with hyperglycemia and possible UTI.  She was started on Keflex and Metformin was increased and discharged to nursing home in stable condition.  Reportedly her glucose was noted to be in 400s yesterday and 500s this morning therefore she was brought by EMS to ED for further management.  She has underlying autism and ambulatory at baseline but today she has been more confused.  Upon my evaluation: She is resting comfortably on the bed, appears dehydrated, she tells me she is doing okay, and denies any complaints.  ED Course: Upon arrival to ED: Patient tachycardic, tachypneic, afebrile with leukocytosis of 15.3, platelet of 501, CMP shows worsening kidney function, anion gap of 22, blood glucose of 459, CO2: 18, UA positive for leukocyte and WBC, A1c: 9.9, COVID-19 pending.  Beta hydroxybutyric acid: Pending.  Patient received IV fluid bolus x3, KCl 10 meq and started on insulin drip in ED.  [facility] hospitalist consulted for admission for DKA.  Review of Systems: As per HPI otherwise negative.    Past Medical History:  Diagnosis Date  . Anemia   . Anxiety and depression    per med rec  .  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 Autism   . Chronic CHF (congestive heart failure) (HCC)    per med rec  . Chronic kidney disease (CKD)    stage 3 per chart in 09/2015  . Diabetes mellitus without complication ([place])   . Diabetic neuropathy ([place])   . History of thyroid nodule    nontoxic goiter per med rec  . Hodgkin disease ([place])    in 77  . HTN (hypertension), benign   . Hyperlipidemia   . Incontinence   . Lack of sensation   . Osteopenia    per med rec  . Poor balance   . Stroke ([place])   . Tubular adenoma of colon 01/2017  . Vitamin D deficiency     Past Surgical History:  Procedure Laterality Date  . ABDOMINAL HYSTERECTOMY    . BREAST LUMPECTOMY Left      reports that she has never smoked. She has never used smokeless tobacco. She reports that she does not drink alcohol and does not use drugs.  Allergies  Allergen Reactions  . Penicillins Rash    On MAR: Has patient had a PCN reaction causing immediate rash, facial/tongue/throat swelling, SOB or lightheadedness with hypotension: Yes Has patient had a PCN reaction causing severe rash involving mucus membranes or skin necrosis: Unk Has patient had a PCN reaction that required hospitalization: Unk Has patient had a PCN reaction occurring within the last 10 years: Unk If all of the above answers are "NO", then may proceed with Cephalosporin use.     Family History  Problem Relation Age of Onset  . CAD  Mother        died at 47 yo  . Atrial fibrillation Mother        and tachycardia  . CAD Father        died at 88 yo  . Atrial fibrillation Father        and tachycardia    Prior to Admission medications   Medication Sig Start Date End Date Taking? Authorizing Provider  aspirin 81 MG tablet Take 1 tablet (81 mg total) by mouth daily. 07/26/17  Yes Patrecia Pour, MD  atorvastatin (LIPITOR) 20 MG tablet Take 1 tablet (20 mg total) by mouth every evening. 10/20/16  Yes Henson, Vickie L, NP-C  calcium carbonate (TUMS EX) 750  MG chewable tablet Chew 1 tablet by mouth daily.   Yes [provider]  cephALEXin (KEFLEX) 500 MG capsule Take 1 capsule (500 mg total) by mouth 3 (three) times daily for 7 days. 12/01/19 12/08/19 Yes Gareth Morgan, MD  Cholecalciferol (VITAMIN D3) 2000 UNITS TABS Take 1 tablet by mouth daily.    Yes [provider]  Fe Fum-FePoly-Vit C-Vit B3 (INTEGRA PO) Take by mouth. 120-40-3mg  capsule,1cap po daily   Yes [provider]  ferrous sulfate 325 (65 FE) MG tablet Take 325 mg by mouth daily.    Yes [provider]  fesoterodine (TOVIAZ) 8 MG TB24 tablet TAKE 1 TAB BY MOUTH EVERY DAY.  DO NOT CRUSH OR CHEW . Patient taking differently: Take 8 mg by mouth daily.  10/25/16  Yes Denita Lung, MD  hydrALAZINE (APRESOLINE) 10 MG tablet Take 1 tablet (10 mg total) by mouth 3 (three) times daily. Patient taking differently: Take 10 mg by mouth in the morning and at bedtime.  08/03/17  Yes Rai, Ripudeep K, MD  magnesium oxide (MAG-OX) 400 MG tablet Take 400 mg by mouth every morning.    Yes [provider]  metFORMIN (GLUCOPHAGE) 500 MG tablet Take 1 tablet (500 mg total) by mouth See admin instructions. Take 1000mg  in the morning and 500mg  at night. Patient taking differently: Take 500-1,000 mg by mouth See admin instructions. Take 2 tabs in the morning and 1 tab at bedtime. 12/01/19  Yes Gareth Morgan, MD  Multiple Vitamins-Minerals (CERTA-VITE PO) Take 1 tablet by mouth daily.    Yes [provider]  Nutritional Supplements (GLUCERNA PO) Take 1 Can by mouth 2 (two) times daily.    Yes [provider]  omeprazole (PRILOSEC) 20 MG capsule Take 20 mg by mouth daily.  05/01/18  Yes [provider]  Sodium Fluoride (PREVIDENT 5000 BOOSTER PLUS DT) Place 1 application onto teeth every evening.    Yes [provider]  Stannous Fluoride (SENSODYNE COMPLETE PROTECTION MT) Use as directed in the mouth or throat. Mint,place one  application onto teeth daily as directed   Yes [provider]  verapamil (CALAN-SR) 120 MG CR tablet Take 120 mg by mouth at bedtime.   Yes [provider]    Physical Exam: Vitals:   12/04/19 0906 12/04/19 0915 12/04/19 1045 12/04/19 1100  BP:  (!) 130/94 (!) 140/95 (!) 153/86  Pulse:  (!) 114 (!) 107 79  Resp:  (!) 30 18 (!) 30  Temp: 98.9 F (37.2 C)     TempSrc: Rectal     SpO2:  100% 99% 100%    Constitutional: NAD, calm, comfortable, on room air, appears dehydrated Eyes: PERRL, lids and conjunctivae normal ENMT: Mucous membranes are: Dry. Posterior pharynx clear of any  exudate or lesions.Normal dentition.  Neck: normal, supple, no masses, no thyromegaly Respiratory:  no wheezing, rhonchi or crackles. Cardiovascular: Tachycardic, no murmurs / rubs / gallops. No extremity edema. 2+ pedal pulses. No carotid bruits.  Abdomen: no tenderness, no masses palpated. No hepatosplenomegaly. Bowel sounds positive.  Musculoskeletal: no clubbing / cyanosis. No joint deformity upper and lower extremities. Good ROM, no contractures. Normal muscle tone.  Skin: no rashes, lesions, ulcers. No induration Neurologic: Alert, does not make eye contact.  Does not follow commands.  Labs on Admission: I have personally reviewed following labs and imaging studies  CBC: Recent Labs  Lab 12/01/19 0900 12/04/19 0844  WBC 8.0 15.3*  NEUTROABS 4.8 12.6*  HGB 11.1* 13.2  HCT 36.8 43.1  MCV 81.8 80.4  PLT 368 098*   Basic Metabolic Panel: Recent Labs  Lab 12/01/19 0941 12/04/19 0844  NA 135 139  K 4.4 5.0  CL 101 99  CO2 24 18*  GLUCOSE 332* 523*  BUN 24* 50*  CREATININE 1.01* 1.44*  CALCIUM 9.2 10.7*   GFR: CrCl cannot be calculated (Unknown ideal weight.). Liver Function Tests: Recent Labs  Lab 12/01/19 0941 12/04/19 0844  AST 11* 78*  ALT 14 33  ALKPHOS 106 114  BILITOT 0.1* 0.5  PROT 7.2 8.3*  ALBUMIN 3.4* 4.0   No results for input(s): LIPASE, AMYLASE  in the last 168 hours. No results for input(s): AMMONIA in the last 168 hours. Coagulation Profile: No results for input(s): INR, PROTIME in the last 168 hours. Cardiac Enzymes: No results for input(s): CKTOTAL, CKMB, CKMBINDEX, TROPONINI in the last 168 hours. BNP (last 3 results) No results for input(s): PROBNP in the last 8760 hours. HbA1C: Recent Labs    12/04/19 0844  HGBA1C 9.9*   CBG: Recent Labs  Lab 12/01/19 0731 12/01/19 1220 12/04/19 1020 12/04/19 1120 12/04/19 1156  GLUCAP 314* 257* 459* 444* 258*   Lipid Profile: No results for input(s): CHOL, HDL, LDLCALC, TRIG, CHOLHDL, LDLDIRECT in the last 72 hours. Thyroid Function Tests: No results for input(s): TSH, T4TOTAL, FREET4, T3FREE, THYROIDAB in the last 72 hours. Anemia Panel: No results for input(s): VITAMINB12, FOLATE, FERRITIN, TIBC, IRON, RETICCTPCT in the last 72 hours. Urine analysis:    Component Value Date/Time   COLORURINE YELLOW 12/04/2019 0856   APPEARANCEUR CLEAR 12/04/2019 0856   APPEARANCEUR Clear 10/11/2017 1310   LABSPEC 1.026 12/04/2019 0856   PHURINE 5.0 12/04/2019 0856   GLUCOSEU >=500 (A) 12/04/2019 0856   HGBUR MODERATE (A) 12/04/2019 0856   BILIRUBINUR NEGATIVE 12/04/2019 0856   BILIRUBINUR Negative 10/11/2017 1310   KETONESUR 20 (A) 12/04/2019 0856   PROTEINUR 100 (A) 12/04/2019 0856   UROBILINOGEN negative 03/14/2016 1227   NITRITE NEGATIVE 12/04/2019 0856   LEUKOCYTESUR TRACE (A) 12/04/2019 0856    Radiological Exams on Admission: DG Chest Portable 1 View  Result Date: 12/04/2019 CLINICAL DATA:  Dyspnea.  Altered mental status. EXAM: PORTABLE CHEST 1 VIEW COMPARISON:  09/17/2019 FINDINGS: No focal consolidation. Streaky left basilar opacities. The visualized skeletal structures are unremarkable. Similar cardiac silhouette. Aortic atherosclerosis. IMPRESSION: Streaky left basilar opacities are favored to reflect atelectasis. Dedicated PA and lateral radiographs could better  evaluate if clinically indicated. Electronically Signed   By: Margaretha Sheffield MD   On: 12/04/2019 11:08    EKG: Independently reviewed.  Sinus tachycardia, biatrial enlargement, borderline prolonged QT interval.  No ST elevation or depression noted.  Assessment/Plan Principal Problem:   DKA (diabetic ketoacidoses) (HCC) Active Problems:   Hypertension  Hyperlipidemia   GERD (gastroesophageal reflux disease)   Chronic CHF (congestive heart failure) (HCC)   Stroke (HCC)   Acute on chronic kidney failure (HCC)   Thrombocytosis (HCC)   DKA: -Uncontrolled type 2 diabetes mellitus.  Patient tachycardic, tachypneic, afebrile with leukocytosis of 15.3, worsening kidney function, blood glucose of 523, anion gap of 22,, CO2 of 18, A1c: 9.9. -Patient received IV fluid bolus x3 in ED and started on insulin drip. -Admit patient to stepdown unit for close monitoring.  On telemetry.  Continue insulin drip.  Monitor blood sugar closely. -Continue normal saline if blood sugar more than 250, change IV fluid to D5 half-normal saline if blood sugar is less than 250. -Beta hydroxybutyric acid, lactic acid: Pending.  COVID-19 is pending -Monitor vitals closely.  We will keep her n.p.o. -Consult diabetic counselor  AKI on CKD stage III: -GFR: 33, creatinine: 1.44 (baseline GFR: 50, creatinine: 1.01) -Continue IV fluids.  Monitor kidney function closely.  Hold nephrotoxic medication  UTI? -Patient presented to ED on 9/5 and was prescribed Keflex as urine was positive for leukocyte and bacteria.  Urine culture shows multiple species suggested recollection. -UA from today shows trace leukocytes, WBC.  Urine culture is pending. -I will start on Rocephin and will follow urine culture result.  Hypertension: Blood pressure is labile -She takes hydralazine and verapamil at home.  Will hold for now as patient is NPO.  Start patient on hydralazine for blood pressure more than 160/100.  Monitor blood pressure  closely.  Leukocytosis: WBC of 15.3 -Could be UTI?  Versus reactive.  Continue Rocephin.  Repeat CBC tomorrow a.m.  Thrombocytosis: Platelet of 501. -Repeat CBC tomorrow a.m.  Hyperlipidemia: Hold atorvastatin  GERD: Hold PPI  History of stroke: -Hold aspirin and atorvastatin  Chronic diastolic CHF: -Patient appears euvolemic on exam. -Strict INO's and daily weight.  Monitor signs of fluid overload.  DVT prophylaxis: Lovenox/SCD Code Status: DNR-DNR reviewed documents Family Communication: None present at bedside.  Plan of care discussed with patient in length and she verbalized understanding and agreed with it. Disposition Plan: Nursing home in 1 to 2 days Consults called: None Admission status: Inpatient   Mckinley Jewel MD [facility]  If 7PM-7AM, please contact night-coverage [email or web address] Password [facility]  12/04/2019, 12:09 PM

## 2019-12-04 NOTE — ED Notes (Signed)
Pt's CBG result was 192. Informed Gerald Stabs - RN.

## 2019-12-04 NOTE — ED Notes (Signed)
Call [name]  POA at [phone] with an update

## 2019-12-04 NOTE — ED Notes (Signed)
Pt's CBG result was 172. Informed Sarah - RN.

## 2019-12-04 NOTE — ED Triage Notes (Signed)
Pt arrives from [facility] for for hyperglycemia. CBG elevated yesterday morning to 400, this morning to 500. Dx with UTI and prescribed Keflex yesterday at [facility]. Today pt tachypnic, tachycardic. Normally ambulatory, today somewhat more confused than normal.

## 2019-12-04 NOTE — Progress Notes (Addendum)
Inpatient Diabetes Program Recommendations  AACE/ADA: New Consensus Statement on Inpatient Glycemic Control (2015)  Target Ranges:  Prepandial:   less than 140 mg/dL      Peak postprandial:   less than 180 mg/dL (1-2 hours)      Critically ill patients:  140 - 180 mg/dL   Lab Results  Component Value Date   GLUCAP 459 (H) 12/04/2019   HGBA1C 9.9 (H) 12/04/2019    Review of Glycemic Control Results for Lisa Villegas, Lisa Villegas (MRN [id number]) as of 12/04/2019 10:33  Ref. Range 12/04/2019 10:20  Glucose-Capillary Latest Ref Range: 70 - 99 mg/dL 459 (H)  Results for Lisa Villegas, Lisa Villegas (MRN [id number]) as of 12/04/2019 10:33  Ref. Range 12/04/2019 08:44  CO2 Latest Ref Range: 22 - 32 mmol/L 18 (L)  Results for Lisa Villegas, Lisa Villegas (MRN [id number]) as of 12/04/2019 10:33  Ref. Range 12/04/2019 08:44  Anion gap Latest Ref Range: 5 - 15  22 (H)  Results for Lisa Villegas, Lisa Villegas (MRN [id number]) as of 12/04/2019 10:33  Ref. Range 12/04/2019 08:44  GFR, Est Non African American Latest Ref Range: >60 mL/min 33 (L)  GFR, Est African American Latest Ref Range: >60 mL/min 38 (L)   Diabetes history:  DM2  Outpatient Diabetes medications:  Metformin 1000 mg qam & 500 mg qpm  Current orders for Inpatient glycemic control:  IV Insulin  Inpatient Diabetes Program Recommendations:     When criteria is met and MD is ready to transition to subcutaneous insulin please consider,  Lantus 10 units 2 hrs prior to dc of IV insulin Novolog 0-9 units TID  Note:  Current A1C is 9.9%.  Presents to ED in DKA.  Might consider adding Tradjenta at discharge as it if fecally cleared.    Will continue to follow while inpatient.  Thank you, Reche Dixon, RN, BSN Diabetes Coordinator Inpatient Diabetes Program [phone] (team pager from 8a-5p)

## 2019-12-04 NOTE — ED Notes (Signed)
Pt's CBG result was 199. Informed Gerald Stabs - RN.

## 2019-12-05 LAB — BASIC METABOLIC PANEL
Anion gap: 11 (ref 5–15)
BUN: 25 mg/dL — ABNORMAL HIGH (ref 8–23)
CO2: 25 mmol/L (ref 22–32)
Calcium: 9.1 mg/dL (ref 8.9–10.3)
Chloride: 106 mmol/L (ref 98–111)
Creatinine, Ser: 0.85 mg/dL (ref 0.44–1.00)
GFR calc Af Amer: >60 mL/min (ref >60)
GFR calc non Af Amer: >60 mL/min (ref >60)
Glucose, Bld: 172 mg/dL — ABNORMAL HIGH (ref 70–99)
Potassium: 4 mmol/L (ref 3.5–5.1)
Sodium: 142 mmol/L (ref 135–145)

## 2019-12-05 LAB — URINE CULTURE: Culture: NO GROWTH

## 2019-12-05 LAB — GLUCOSE, CAPILLARY
Glucose-Capillary: 227 mg/dL — ABNORMAL HIGH (ref 70–99)
Glucose-Capillary: 214 mg/dL — ABNORMAL HIGH (ref 70–99)

## 2019-12-05 LAB — CBG MONITORING, ED
Glucose-Capillary: 121 mg/dL — ABNORMAL HIGH (ref 70–99)
Glucose-Capillary: 149 mg/dL — ABNORMAL HIGH (ref 70–99)
Glucose-Capillary: 160 mg/dL — ABNORMAL HIGH (ref 70–99)
Glucose-Capillary: 166 mg/dL — ABNORMAL HIGH (ref 70–99)
Glucose-Capillary: 189 mg/dL — ABNORMAL HIGH (ref 70–99)

## 2019-12-05 MED ORDER — ATORVASTATIN CALCIUM 10 MG PO TABS
20.0000 mg | ORAL_TABLET | Freq: Every evening | ORAL | Status: DC
  Administered 2019-12-05: 20 mg via ORAL
  Filled 2019-12-05: qty 2

## 2019-12-05 MED ORDER — HYDRALAZINE HCL 10 MG PO TABS
10.0000 mg | ORAL_TABLET | Freq: Three times a day (TID) | ORAL | Status: DC
  Administered 2019-12-05 – 2019-12-06 (×2): 10 mg via ORAL
  Filled 2019-12-05 (×2): qty 1

## 2019-12-05 MED ORDER — VERAPAMIL HCL ER 120 MG PO TBCR
120.0000 mg | EXTENDED_RELEASE_TABLET | Freq: Every day | ORAL | Status: DC
  Administered 2019-12-05: 120 mg via ORAL
  Filled 2019-12-05 (×3): qty 1

## 2019-12-05 MED ORDER — ASPIRIN EC 81 MG PO TBEC
81.0000 mg | DELAYED_RELEASE_TABLET | Freq: Every day | ORAL | Status: DC
  Administered 2019-12-05 – 2019-12-06 (×2): 81 mg via ORAL
  Filled 2019-12-05 (×2): qty 1

## 2019-12-05 NOTE — Progress Notes (Signed)
PROGRESS NOTE    [name]  AYT:[id number] DOB: 1931/03/01 DOA: 12/04/2019 PCP: [name], MD     Brief Narrative:  [place] Villegas is a 86 y.o. female with medical history significant of type 2 diabetes mellitus, autism spectrum disorder, history of CVA, chronic diastolic CHF with preserved ejection fraction and grade 2 diastolic dysfunction, CKD stage III, hypertension, hyperlipidemia brought by EMS to the emergency department for the evaluation of hyperglycemia.   Patient had recent evaluation in ED on 12/01/2019 with hyperglycemia and possible UTI.  She was started on Keflex and Metformin was increased and discharged to nursing home in stable condition.  Reportedly her glucose was noted to be in 400s yesterday and 500s this morning therefore she was brought by EMS to ED for further management.  She has underlying autism and ambulatory at baseline but today she has been more confused.   She was diagnosed with DKA on admission, started on IV insulin as well as IV fluid.  New events last 24 hours / Subjective: Anion gap closed overnight and patient was transitioned to subcutaneous insulin.  On my examination, she has bedsheet covering her head.  She refused physical examination or to interact with me numerous times.  Assessment & Plan:   Principal Problem:   DKA (diabetic ketoacidoses) ([place]) Active Problems:   Hypertension   Hyperlipidemia   GERD (gastroesophageal reflux disease)   Chronic CHF (congestive heart failure) (HCC)   Stroke (HCC)   Acute on chronic kidney failure (HCC)   Thrombocytosis (HCC)   DKA -Ha1c 9.9  -Diabetic coordinator consulted -Improved and now off insulin gtt. Continue lantus, SSI   AKI on CKD stage IIIa -Baseline creatinine 1 -Resolved   Hypertension -Continue hydralazine and verapamil   History of stroke -Continue aspirin and atorvastatin  Chronic diastolic CHF -Patient appears euvolemic on exam -Monitor   DVT  prophylaxis:  enoxaparin (LOVENOX) injection 30 mg Start: 12/04/19 1200 SCDs Start: 12/04/19 1033  Code Status: DNR Family Communication: None at bedside, discussed with legal guardian over the phone  Disposition Plan:   Status is: Inpatient  Remains inpatient appropriate because:Altered mental status   Dispo: The patient is from: ALF              Anticipated d/c is to: ALF              Anticipated d/c date is: 1 day              Patient currently is not medically stable to d/c.  Not back to her baseline yet. Hopefully will be cooperative to take PO today, hopeful discharge back to facility 9/10 if back to baseline.    Consultants:   None  Procedures:   None  Antimicrobials:  Anti-infectives (From admission, onward)   Start     Dose/Rate Route Frequency Ordered Stop   12/04/19 1230  cefTRIAXone (ROCEPHIN) 1 g in sodium chloride 0.9 % 100 mL IVPB  Status:  Discontinued        1 g 200 mL/hr over 30 Minutes Intravenous Every 24 hours 12/04/19 1220 12/05/19 1240       Objective: Vitals:   12/05/19 1130 12/05/19 1145 12/05/19 1200 12/05/19 1230  BP: (!) 171/98 (!) 174/153 (!) 173/101 (!) 205/96  Pulse: 80 90 93 86  Resp:   18 17  Temp:      TempSrc:      SpO2: 98% 93% 100% 95%    Intake/Output Summary (Last 24 hours) at 12/05/2019  [place] filed at 12/05/2019 0238 Gross per 24 hour  Intake 2633.26 ml  Output 700 ml  Net 1933.26 ml   There were no vitals filed for this visit.  Examination:  General exam: Appears calm  Declined exam.   Data Reviewed: I have personally reviewed following labs and imaging studies  CBC: Recent Labs  Lab 12/01/19 0900 12/04/19 0844 12/04/19 1209 12/04/19 1222  WBC 8.0 15.3* 15.8*  --   NEUTROABS 4.8 12.6*  --   --   HGB 11.1* 13.2 12.0 12.9  HCT 36.8 43.1 40.7 38.0  MCV 81.8 80.4 82.4  --   PLT 368 501* 385  --    Basic Metabolic Panel: Recent Labs  Lab 12/04/19 0844 12/04/19 0844 12/04/19 1209 12/04/19 1222  12/04/19 1450 12/04/19 2029 12/05/19 0333  NA 139   < > 143 142 143 139 142  K 5.0   < > 4.1 4.1 3.6 3.3* 4.0  CL 99  --  105  --  106 105 106  CO2 18*  --  22  --  23 23 25   GLUCOSE 523*  --  222*  --  181* 151* 172*  BUN 50*  --  39*  --  33* 28* 25*  CREATININE 1.44*  --  1.17*  --  1.01* 0.84 0.85  CALCIUM 10.7*  --  9.8  --  9.3 8.9 9.1  MG  --   --  1.9  --   --   --   --   PHOS  --   --  3.1  --   --   --   --    < > = values in this interval not displayed.   GFR: CrCl cannot be calculated (Unknown ideal weight.). Liver Function Tests: Recent Labs  Lab 12/01/19 0941 12/04/19 0844  AST 11* 78*  ALT 14 33  ALKPHOS 106 114  BILITOT 0.1* 0.5  PROT 7.2 8.3*  ALBUMIN 3.4* 4.0   No results for input(s): LIPASE, AMYLASE in the last 168 hours. No results for input(s): AMMONIA in the last 168 hours. Coagulation Profile: No results for input(s): INR, PROTIME in the last 168 hours. Cardiac Enzymes: No results for input(s): CKTOTAL, CKMB, CKMBINDEX, TROPONINI in the last 168 hours. BNP (last 3 results) No results for input(s): PROBNP in the last 8760 hours. HbA1C: Recent Labs    12/04/19 0844  HGBA1C 9.9*   CBG: Recent Labs  Lab 12/05/19 0025 12/05/19 0150 12/05/19 0243 12/05/19 0842 12/05/19 1156  GLUCAP 166* 160* 189* 121* 149*   Lipid Profile: No results for input(s): CHOL, HDL, LDLCALC, TRIG, CHOLHDL, LDLDIRECT in the last 72 hours. Thyroid Function Tests: No results for input(s): TSH, T4TOTAL, FREET4, T3FREE, THYROIDAB in the last 72 hours. Anemia Panel: No results for input(s): VITAMINB12, FOLATE, FERRITIN, TIBC, IRON, RETICCTPCT in the last 72 hours. Sepsis Labs: Recent Labs  Lab 12/04/19 1051 12/04/19 1325  LATICACIDVEN 3.1* 3.9*    Recent Results (from the past 240 hour(s))  Urine culture     Status: Abnormal   Collection Time: 12/01/19 11:31 AM   Specimen: Urine, Clean Catch  Result Value Ref Range Status   Specimen Description   Final     URINE, CLEAN CATCH Performed at [facility], [address] [place], [place] [address]    Special Requests   Final    NONE Performed at [facility], [address] [place], [place] [address]  Culture MULTIPLE SPECIES PRESENT, SUGGEST RECOLLECTION (A)  Final   Report Status 12/02/2019 FINAL  Final  Urine culture     Status: None   Collection Time: 12/04/19  8:56 AM   Specimen: Urine, Random  Result Value Ref Range Status   Specimen Description URINE, RANDOM  Final   Special Requests NONE  Final   Culture   Final    NO GROWTH Performed at [facility], [place] [address], [place], [place] [address]    Report Status 12/05/2019 FINAL  Final  SARS Coronavirus 2 by RT PCR (hospital order, performed in [facility] hospital lab) Nasopharyngeal Nasopharyngeal Swab     Status: None   Collection Time: 12/04/19 10:51 AM   Specimen: Nasopharyngeal Swab  Result Value Ref Range Status   SARS Coronavirus 2 NEGATIVE NEGATIVE Final    Comment: (NOTE) SARS-CoV-2 target nucleic acids are NOT DETECTED.  The SARS-CoV-2 RNA is generally detectable in upper and lower respiratory specimens during the acute phase of infection. The lowest concentration of SARS-CoV-2 viral copies this assay can detect is 250 copies / mL. A negative result does not preclude SARS-CoV-2 infection and should not be used as the sole basis for treatment or other patient management decisions.  A negative result may occur with improper specimen collection / handling, submission of specimen other than nasopharyngeal swab, presence of viral mutation(s) within the areas targeted by this assay, and inadequate number of viral copies (<250 copies / mL). A negative result must be combined with clinical observations, patient history, and epidemiological information.  Fact Sheet for Patients:   StrictlyIdeas.no  Fact Sheet for Healthcare  Providers: BankingDealers.co.za  This test is not yet approved or  cleared by the [place] FDA and has been authorized for detection and/or diagnosis of SARS-CoV-2 by FDA under an Emergency Use Authorization (EUA).  This EUA will remain in effect (meaning this test can be used) for the duration of the COVID-19 declaration under Section 564(b)(1) of the Act, 21 U.S.C. section 360bbb-3(b)(1), unless the authorization is terminated or revoked sooner.  Performed at [facility], [place] [address], [place], [place] [address]       Radiology Studies: DG Chest Portable 1 View  Result Date: 12/04/2019 CLINICAL DATA:  Dyspnea.  Altered mental status. EXAM: PORTABLE CHEST 1 VIEW COMPARISON:  09/17/2019 FINDINGS: No focal consolidation. Streaky left basilar opacities. The visualized skeletal structures are unremarkable. Similar cardiac silhouette. Aortic atherosclerosis. IMPRESSION: Streaky left basilar opacities are favored to reflect atelectasis. Dedicated PA and lateral radiographs could better evaluate if clinically indicated. Electronically Signed   By: Margaretha Sheffield MD   On: 12/04/2019 11:08      Scheduled Meds:  aspirin  81 mg Oral Daily   atorvastatin  20 mg Oral QPM   enoxaparin (LOVENOX) injection  30 mg Subcutaneous Q24H   hydrALAZINE  10 mg Oral Q8H   insulin aspart  0-15 Units Subcutaneous TID AC & HS   insulin glargine  15 Units Subcutaneous QHS   verapamil  120 mg Oral QHS   Continuous Infusions:    LOS: 1 day      Time spent: 35 minutes   Dessa Phi, DO [facility] 12/05/2019, 12:43 PM   Available via Epic secure chat 7am-7pm After these hours, please refer to coverage provider listed on [email or web address]

## 2019-12-05 NOTE — ED Notes (Signed)
[name] POA [phone] would like an update

## 2019-12-05 NOTE — ED Notes (Signed)
Attempted to call 4E for report - unsuccessful.

## 2019-12-05 NOTE — Progress Notes (Signed)
Pt received from ED to 4e08. Oriented to room and call bell. CHG bath complete. CCMD called. VSS. Call bell in reach. Will continue to monitor.  Arletta Bale, RN

## 2019-12-05 NOTE — ED Notes (Signed)
Charted paper downtime: Pt on pure wick, soiled linens, pt provided hygiene care, fresh linens and repositioned in bed in most comfortable position.

## 2019-12-06 LAB — CBC
HCT: 37.3 % (ref 36.0–46.0)
Hemoglobin: 11.2 g/dL — ABNORMAL LOW (ref 12.0–15.0)
MCH: 24.2 pg — ABNORMAL LOW (ref 26.0–34.0)
MCHC: 30 g/dL (ref 30.0–36.0)
MCV: 80.6 fL (ref 80.0–100.0)
Platelets: 310 10*3/uL (ref 150–400)
RBC: 4.63 MIL/uL (ref 3.87–5.11)
RDW: 17.2 % — ABNORMAL HIGH (ref 11.5–15.5)
WBC: 11.2 10*3/uL — ABNORMAL HIGH (ref 4.0–10.5)
nRBC: 0 % (ref 0.0–0.2)

## 2019-12-06 LAB — BASIC METABOLIC PANEL
Anion gap: 11 (ref 5–15)
BUN: 29 mg/dL — ABNORMAL HIGH (ref 8–23)
CO2: 23 mmol/L (ref 22–32)
Calcium: 8.8 mg/dL — ABNORMAL LOW (ref 8.9–10.3)
Chloride: 103 mmol/L (ref 98–111)
Creatinine, Ser: 0.83 mg/dL (ref 0.44–1.00)
GFR calc Af Amer: >60 mL/min (ref >60)
GFR calc non Af Amer: >60 mL/min (ref >60)
Glucose, Bld: 98 mg/dL (ref 70–99)
Potassium: 3.3 mmol/L — ABNORMAL LOW (ref 3.5–5.1)
Sodium: 137 mmol/L (ref 135–145)

## 2019-12-06 LAB — GLUCOSE, CAPILLARY
Glucose-Capillary: 130 mg/dL — ABNORMAL HIGH (ref 70–99)
Glucose-Capillary: 312 mg/dL — ABNORMAL HIGH (ref 70–99)

## 2019-12-06 MED ORDER — LANTUS SOLOSTAR 100 UNIT/ML ~~LOC~~ SOPN: 12.0000 [IU] | PEN_INJECTOR | Freq: Every day | SUBCUTANEOUS | 0 refills | Status: DC

## 2019-12-06 MED ORDER — BLOOD GLUCOSE METER KIT: PACK | 0 refills | Status: AC

## 2019-12-06 MED ORDER — POTASSIUM CHLORIDE CRYS ER 20 MEQ PO TBCR
40.0000 meq | EXTENDED_RELEASE_TABLET | Freq: Once | ORAL | Status: AC
  Administered 2019-12-06: 40 meq via ORAL
  Filled 2019-12-06: qty 2

## 2019-12-06 MED FILL — PENTIPS 32G X 4 MM MISC: 32G X 4 MM | 30 days supply | Qty: 100 | Fill #0

## 2019-12-06 MED FILL — LANTUS SOLOSTAR 100 UNITS/M: 100 | 25 days supply | Qty: 3 | Fill #0

## 2019-12-06 NOTE — Consult Note (Signed)
   [facility] Inpatient Consult   12/06/2019  Lisa Villegas Feb 15, 1931 [id number]  [facility] [ACO] Patient:   Patient screened for extreme high risk score for unplanned readmission score. Medical record reviewed and patient is from [facility].  Also, notes revealed patient is not a good historian with underlying autism per MD notes in history and physical. Notes that Hemoglobin A1C 9.9  Primary Care Provider is [name], MD this provider is listed to provide the transition of care [TOC] for post hospital follow up.   Plan: Current need to be met at the facility.  For questions contact:   Natividad Brood, RN BSN [facility] Liaison  [phone] business mobile phone Toll free office [phone]  Fax number: [phone] [email or web address]

## 2019-12-06 NOTE — Progress Notes (Signed)
Inpatient Diabetes Program Recommendations  AACE/ADA: New Consensus Statement on Inpatient Glycemic Control (2015)  Target Ranges:  Prepandial:   less than 140 mg/dL      Peak postprandial:   less than 180 mg/dL (1-2 hours)      Critically ill patients:  140 - 180 mg/dL   Lab Results  Component Value Date   GLUCAP 130 (H) 12/06/2019   HGBA1C 9.9 (H) 12/04/2019    Review of Glycemic Control  Diabetes history: DM 2 Outpatient Diabetes medications: Metformin 1000 mg qam, 500 mg qpm Current orders for Inpatient glycemic control:  Lantus 15 units Novolog 0-15 units 4x/day  A1c 9.9% on 9/8  Inpatient Diabetes Program Recommendations:    Note pt from ALF and will have assistance with medications.  Pt would benefit from once daily basal insulin at home but would lower dose to 12 units along with her metformin dose at home.  Thanks,  Tama Headings RN, MSN, BC-ADM Inpatient Diabetes Coordinator Team Pager [phone] (8a-5p)

## 2019-12-06 NOTE — TOC Transition Note (Signed)
Transition of Care [facility]) - CM/SW Discharge Note   Patient Details  Name: Lisa Villegas MRN: [id number] Date of Birth: Mar 08, 1931  [facility]) CM/SW Contact:  Trula Ore, [place] Phone Number: 12/06/2019, 1:05 PM   Clinical Narrative:     Patient will DC to: [facility]  Anticipated DC date: 12/06/2019  Family notified: [name]  Transport by: Car/[name]  ?  Per MD patient ready for DC to [facility]. RN, patient, patient's family, and facility notified of DC. Discharge Summary sent to facility. RN given number for report tele#[phone] RM#[phone]. DC packet on chart. Ambulance transport requested for patient.  CSW signing off.  Final next level of care: Assisted Living     Patient Goals and CMS Choice        Discharge Placement              Patient chooses bed at: [facility] Patient to be transferred to facility by: car/[name] Name of family member notified: [name] Patient and family notified of of transfer: 12/06/19  Discharge Plan and Services                                     Social Determinants of Health (SDOH) Interventions     Readmission Risk Interventions Readmission Risk Prevention Plan 08/01/2019 04/02/2019  Transportation Screening Complete Complete  PCP or Specialist Appt within 3-5 Days Complete Complete  HRI or [place] Complete Complete  Social Work Consult for [place] Planning/Counseling Complete Complete  Palliative Care Screening Complete Not Applicable  Medication Review [employer] photographer) Complete Referral to Pharmacy  Some recent data might be hidden

## 2019-12-06 NOTE — Discharge Summary (Addendum)
Physician Discharge Summary  Lisa Villegas QGB:[id number] DOB: Oct 02, 1930 DOA: 12/04/2019  PCP: [name], MD  Admit date: 12/04/2019 Discharge date: 12/06/2019  Admitted From: ALF Disposition:  ALF  Recommendations for Outpatient Follow-up:  1. Follow up with PCP in 1 week 2. Recommend outpatient palliative care follow up   Discharge Condition: Stable CODE STATUS: DNR  Diet recommendation: Carb modified   Brief/Interim Summary: Lisa Villegas a 86 y.o.femalewith medical history significant oftype 2 diabetes mellitus, autism spectrum disorder, history of CVA, chronic diastolic CHF with preserved ejection fraction and grade 2 diastolic dysfunction, CKD stage III, hypertension, hyperlipidemia brought by EMS to the emergency department for the evaluation of hyperglycemia.   Patient had recent evaluation in ED on 12/01/2019 with hyperglycemia and possible UTI. She was started on Keflex and Metformin was increased and discharged to nursing home in stable condition. Reportedly her glucose was noted to be in 400s yesterday and 500sthis morning therefore she was brought by EMS to ED for further management. She has underlying autism and ambulatory at baseline but today she has been more confused.   She was diagnosed with DKA on admission, started on IV insulin as well as IV fluid. Anion gap closed and patient was transitioned to subcutaneous insulin. Her mentation returned back to her baseline.  Discharge Diagnoses:  Principal Problem:   DKA (diabetic ketoacidoses) ([place]) Active Problems:   Hypertension   Hyperlipidemia   GERD (gastroesophageal reflux disease)   Chronic CHF (congestive heart failure) (HCC)   Stroke (HCC)   Acute on chronic kidney failure (HCC)   Thrombocytosis (HCC)   DKA -Ha1c 9.9  -Diabetic coordinator consulted -Improved and now off insulin gtt. Continue lantus  AKI on CKD stage IIIa -Baseline creatinine 1 -Resolved    Hypertension -Continue hydralazine and verapamil   History of stroke -Continue aspirin and atorvastatin  Chronic diastolic CHF -Patient appears euvolemic on exam -Monitor  Hypokalemia -Replace   Discharge Instructions  Discharge Instructions    Call MD for:  difficulty breathing, headache or visual disturbances   Complete by: As directed    Call MD for:  extreme fatigue   Complete by: As directed    Call MD for:  persistant dizziness or light-headedness   Complete by: As directed    Call MD for:  persistant nausea and vomiting   Complete by: As directed    Call MD for:  severe uncontrolled pain   Complete by: As directed    Call MD for:  temperature >100.4   Complete by: As directed    Diet Carb Modified   Complete by: As directed    Discharge instructions   Complete by: As directed    You were cared for by a hospitalist during your hospital stay. If you have any questions about your discharge medications or the care you received while you were in the hospital after you are discharged, you can call the unit and ask to speak with the hospitalist on call if the hospitalist that took care of you is not available. Once you are discharged, your primary care physician will handle any further medical issues. Please note that NO REFILLS for any discharge medications will be authorized once you are discharged, as it is imperative that you return to your primary care physician (or establish a relationship with a primary care physician if you do not have one) for your aftercare needs so that they can reassess your need for medications and monitor your lab values.   Increase  activity slowly   Complete by: As directed      Allergies as of 12/06/2019      Reactions   Penicillins Rash   On MAR: Has patient had a PCN reaction causing immediate rash, facial/tongue/throat swelling, SOB or lightheadedness with hypotension: Yes Has patient had a PCN reaction causing severe rash involving  mucus membranes or skin necrosis: Unk Has patient had a PCN reaction that required hospitalization: Unk Has patient had a PCN reaction occurring within the last 10 years: Unk If all of the above answers are "NO", then may proceed with Cephalosporin use.      Medication List    STOP taking these medications   cephALEXin 500 MG capsule Commonly known as: KEFLEX     TAKE these medications   aspirin 81 MG tablet Take 1 tablet (81 mg total) by mouth daily.   atorvastatin 20 MG tablet Commonly known as: LIPITOR Take 1 tablet (20 mg total) by mouth every evening.   blood glucose meter kit and supplies Dispense based on patient and insurance preference. Use up to four times daily as directed. (FOR ICD-10 E10.9, E11.9).   calcium carbonate 750 MG chewable tablet Commonly known as: TUMS EX Chew 1 tablet by mouth daily.   CERTA-VITE PO Take 1 tablet by mouth daily.   ferrous sulfate 325 (65 FE) MG tablet Take 325 mg by mouth daily.   fesoterodine 8 MG Tb24 tablet Commonly known as: Toviaz TAKE 1 TAB BY MOUTH EVERY DAY.  DO NOT CRUSH OR CHEW . What changed:   how much to take  how to take this  when to take this  additional instructions   GLUCERNA PO Take 1 Can by mouth 2 (two) times daily.   hydrALAZINE 10 MG tablet Commonly known as: APRESOLINE Take 1 tablet (10 mg total) by mouth 3 (three) times daily. What changed: when to take this   INTEGRA PO Take by mouth. 120-40-24m capsule,1cap po daily   Lantus SoloStar 100 UNIT/ML Solostar Pen Generic drug: insulin glargine Inject 12 Units into the skin at bedtime.   magnesium oxide 400 MG tablet Commonly known as: MAG-OX Take 400 mg by mouth every morning.   metFORMIN 500 MG tablet Commonly known as: GLUCOPHAGE Take 1 tablet (500 mg total) by mouth See admin instructions. Take 10044min the morning and 50075mt night. What changed:   how much to take  additional instructions   omeprazole 20 MG  capsule Commonly known as: PRILOSEC Take 20 mg by mouth daily.   PREVIDENT 5000 BOOSTER PLUS DT Place 1 application onto teeth every evening.   SENSODYNE COMPLETE PROTECTION MT Use as directed in the mouth or throat. Mint,place one application onto teeth daily as directed   verapamil 120 MG CR tablet Commonly known as: CALAN-SR Take 120 mg by mouth at bedtime.   Vitamin D3 50 MCG (2000 UT) Tabs Take 1 tablet by mouth daily.       Follow-up Information    RamMerrilee SeashoreD. Schedule an appointment as soon as possible for a visit in 1 week(s).   Specialty: Internal Medicine Contact information: [address] [phone]              Allergies  Allergen Reactions  . Penicillins Rash    On MAR: Has patient had a PCN reaction causing immediate rash, facial/tongue/throat swelling, SOB or lightheadedness with hypotension: Yes Has patient had a PCN reaction causing severe rash involving mucus membranes  or skin necrosis: Unk Has patient had a PCN reaction that required hospitalization: Unk Has patient had a PCN reaction occurring within the last 10 years: Unk If all of the above answers are "NO", then may proceed with Cephalosporin use.     Consultations:  None   Procedures/Studies: DG Chest Portable 1 View  Result Date: 12/04/2019 CLINICAL DATA:  Dyspnea.  Altered mental status. EXAM: PORTABLE CHEST 1 VIEW COMPARISON:  09/17/2019 FINDINGS: No focal consolidation. Streaky left basilar opacities. The visualized skeletal structures are unremarkable. Similar cardiac silhouette. Aortic atherosclerosis. IMPRESSION: Streaky left basilar opacities are favored to reflect atelectasis. Dedicated PA and lateral radiographs could better evaluate if clinically indicated. Electronically Signed   By: Margaretha Sheffield MD   On: 12/04/2019 11:08      Discharge Exam: Vitals:   12/06/19 0430 12/06/19 0756  BP: (!) 113/54 (!) 114/56  Pulse:  68 83  Resp: (!) 21 19  Temp: 98 F (36.7 C) (!) 97.5 F (36.4 C)  SpO2: 97% 94%    General: Pt is alert, awake, not in acute distress Cardiovascular: RRR, S1/S2 +, no edema Respiratory: CTA bilaterally, no wheezing, no rhonchi, no respiratory distress, no conversational dyspnea  Abdominal: Soft, NT, ND, bowel sounds + Extremities: no edema, no cyanosis Psych: Stable     The results of significant diagnostics from this hospitalization (including imaging, microbiology, ancillary and laboratory) are listed below for reference.     Microbiology: Recent Results (from the past 240 hour(s))  Urine culture     Status: Abnormal   Collection Time: 12/01/19 11:31 AM   Specimen: Urine, Clean Catch  Result Value Ref Range Status   Specimen Description   Final    URINE, CLEAN CATCH Performed at [facility], [address] [place], [place] [address]    Special Requests   Final    NONE Performed at [facility], [address] [place], [place] [address]    Culture MULTIPLE SPECIES PRESENT, SUGGEST RECOLLECTION (A)  Final   Report Status 12/02/2019 FINAL  Final  Urine culture     Status: None   Collection Time: 12/04/19  8:56 AM   Specimen: Urine, Random  Result Value Ref Range Status   Specimen Description URINE, RANDOM  Final   Special Requests NONE  Final   Culture   Final    NO GROWTH Performed at [facility], [place] [address] [place], [place] [address]    Report Status 12/05/2019 FINAL  Final  SARS Coronavirus 2 by RT PCR (hospital order, performed in [facility] hospital lab) Nasopharyngeal Nasopharyngeal Swab     Status: None   Collection Time: 12/04/19 10:51 AM   Specimen: Nasopharyngeal Swab  Result Value Ref Range Status   SARS Coronavirus 2 NEGATIVE NEGATIVE Final    Comment: (NOTE) SARS-CoV-2 target nucleic acids are NOT DETECTED.  The SARS-CoV-2 RNA is generally detectable in upper and lower respiratory specimens during the acute  phase of infection. The lowest concentration of SARS-CoV-2 viral copies this assay can detect is 250 copies / mL. A negative result does not preclude SARS-CoV-2 infection and should not be used as the sole basis for treatment or other patient management decisions.  A negative result may occur with improper specimen collection / handling, submission of specimen other than nasopharyngeal swab, presence of viral mutation(s) within the areas targeted by this assay, and inadequate number of viral copies (<250 copies / mL). A negative result must be  combined with clinical observations, patient history, and epidemiological information.  Fact Sheet for Patients:   StrictlyIdeas.no  Fact Sheet for Healthcare Providers: BankingDealers.co.za  This test is not yet approved or  cleared by the [place] FDA and has been authorized for detection and/or diagnosis of SARS-CoV-2 by FDA under an Emergency Use Authorization (EUA).  This EUA will remain in effect (meaning this test can be used) for the duration of the COVID-19 declaration under Section 564(b)(1) of the Act, 21 U.S.C. section 360bbb-3(b)(1), unless the authorization is terminated or revoked sooner.  Performed at [facility], [place] [address], [place],  [address]      Labs: BNP (last 3 results) Recent Labs    03/30/19 2049  BNP 84.6   Basic Metabolic Panel: Recent Labs  Lab 12/04/19 1209 12/04/19 1209 12/04/19 1222 12/04/19 1450 12/04/19 2029 12/05/19 0333 12/06/19 0229  NA 143   < > 142 143 139 142 137  K 4.1   < > 4.1 3.6 3.3* 4.0 3.3*  CL 105  --   --  106 105 106 103  CO2 22  --   --  _0 GLUCOSE 222*  --   --  181* 151* 172* 98  BUN 39*  --   --  33* 28* 25* 29*  CREATININE 1.17*  --   --  1.01* 0.84 0.85 0.83  CALCIUM 9.8  --   --  9.3 8.9 9.1 8.8*  MG 1.9  --   --   --   --   --   --   PHOS 3.1  --   --   --   --   --   --    < > = values  in this interval not displayed.   Liver Function Tests: Recent Labs  Lab 12/01/19 0941 12/04/19 0844  AST 11* 78*  ALT 14 33  ALKPHOS 106 114  BILITOT 0.1* 0.5  PROT 7.2 8.3*  ALBUMIN 3.4* 4.0   No results for input(s): LIPASE, AMYLASE in the last 168 hours. No results for input(s): AMMONIA in the last 168 hours. CBC: Recent Labs  Lab 12/01/19 0900 12/04/19 0844 12/04/19 1209 12/04/19 1222 12/06/19 0229  WBC 8.0 15.3* 15.8*  --  11.2*  NEUTROABS 4.8 12.6*  --   --   --   HGB 11.1* 13.2 12.0 12.9 11.2*  HCT 36.8 43.1 40.7 38.0 37.3  MCV 81.8 80.4 82.4  --  80.6  PLT 368 501* 385  --  310   Cardiac Enzymes: No results for input(s): CKTOTAL, CKMB, CKMBINDEX, TROPONINI in the last 168 hours. BNP: Invalid input(s): POCBNP CBG: Recent Labs  Lab 12/05/19 0842 12/05/19 1156 12/05/19 1644 12/05/19 2050 12/06/19 0650  GLUCAP 121* 149* 227* 214* 130*   D-Dimer No results for input(s): DDIMER in the last 72 hours. Hgb A1c Recent Labs    12/04/19 0844  HGBA1C 9.9*   Lipid Profile No results for input(s): CHOL, HDL, LDLCALC, TRIG, CHOLHDL, LDLDIRECT in the last 72 hours. Thyroid function studies No results for input(s): TSH, T4TOTAL, T3FREE, THYROIDAB in the last 72 hours.  Invalid input(s): FREET3 Anemia work up No results for input(s): VITAMINB12, FOLATE, FERRITIN, TIBC, IRON, RETICCTPCT in the last 72 hours. Urinalysis    Component Value Date/Time   COLORURINE YELLOW 12/04/2019 [place] 12/04/2019 0856   APPEARANCEUR Clear 10/11/2017 1310   LABSPEC 1.026 12/04/2019 0856   PHURINE 5.0 12/04/2019 0856  GLUCOSEU >=500 (A) 12/04/2019 0856   HGBUR MODERATE (A) 12/04/2019 0856   BILIRUBINUR NEGATIVE 12/04/2019 0856   BILIRUBINUR Negative 10/11/2017 1310   KETONESUR 20 (A) 12/04/2019 0856   PROTEINUR 100 (A) 12/04/2019 0856   UROBILINOGEN negative 03/14/2016 1227   NITRITE NEGATIVE 12/04/2019 0856   LEUKOCYTESUR TRACE (A) 12/04/2019 0856    Sepsis Labs Invalid input(s): PROCALCITONIN,  WBC,  LACTICIDVEN Microbiology Recent Results (from the past 240 hour(s))  Urine culture     Status: Abnormal   Collection Time: 12/01/19 11:31 AM   Specimen: Urine, Clean Catch  Result Value Ref Range Status   Specimen Description   Final    URINE, CLEAN CATCH Performed at [facility], [address] [place], [place] [address]    Special Requests   Final    NONE Performed at [facility], [address] [place], [place] [address]    Culture MULTIPLE SPECIES PRESENT, SUGGEST RECOLLECTION (A)  Final   Report Status 12/02/2019 FINAL  Final  Urine culture     Status: None   Collection Time: 12/04/19  8:56 AM   Specimen: Urine, Random  Result Value Ref Range Status   Specimen Description URINE, RANDOM  Final   Special Requests NONE  Final   Culture   Final    NO GROWTH Performed at [facility], [place] [address], [place], [place] [address]    Report Status 12/05/2019 FINAL  Final  SARS Coronavirus 2 by RT PCR (hospital order, performed in [facility] hospital lab) Nasopharyngeal Nasopharyngeal Swab     Status: None   Collection Time: 12/04/19 10:51 AM   Specimen: Nasopharyngeal Swab  Result Value Ref Range Status   SARS Coronavirus 2 NEGATIVE NEGATIVE Final    Comment: (NOTE) SARS-CoV-2 target nucleic acids are NOT DETECTED.  The SARS-CoV-2 RNA is generally detectable in upper and lower respiratory specimens during the acute phase of infection. The lowest concentration of SARS-CoV-2 viral copies this assay can detect is 250 copies / mL. A negative result does not preclude SARS-CoV-2 infection and should not be used as the sole basis for treatment or other patient management decisions.  A negative result may occur with improper specimen collection / handling, submission of specimen other than nasopharyngeal swab, presence of viral mutation(s) within the areas targeted by this assay, and inadequate  number of viral copies (<250 copies / mL). A negative result must be combined with clinical observations, patient history, and epidemiological information.  Fact Sheet for Patients:   StrictlyIdeas.no  Fact Sheet for Healthcare Providers: BankingDealers.co.za  This test is not yet approved or  cleared by the [place] FDA and has been authorized for detection and/or diagnosis of SARS-CoV-2 by FDA under an Emergency Use Authorization (EUA).  This EUA will remain in effect (meaning this test can be used) for the duration of the COVID-19 declaration under Section 564(b)(1) of the Act, 21 U.S.C. section 360bbb-3(b)(1), unless the authorization is terminated or revoked sooner.  Performed at [facility], [place] [address] [place], [place] [address]      Patient was seen and examined on the day of discharge and was found to be in stable condition. Time coordinating discharge: 35 minutes including assessment and coordination of care, as well as examination of the patient.   SIGNED:  Dessa Phi, DO [facility] 12/06/2019, 9:47 AM

## 2019-12-06 NOTE — Progress Notes (Signed)
Report called to ALF. All questions answered. [name] to escort pt to ALF with medications and discharge instructions. IV removed, clean and intact.  Clyde Canterbury, RN
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
## 2019-12-20 DIAGNOSIS — D631 Anemia in chronic kidney disease: Secondary | ICD-10-CM | POA: Diagnosis not present

## 2019-12-20 DIAGNOSIS — E785 Hyperlipidemia, unspecified: Secondary | ICD-10-CM | POA: Diagnosis not present

## 2019-12-20 DIAGNOSIS — N183 Chronic kidney disease, stage 3 unspecified: Secondary | ICD-10-CM | POA: Diagnosis not present

## 2019-12-20 DIAGNOSIS — Z8571 Personal history of Hodgkin lymphoma: Secondary | ICD-10-CM | POA: Diagnosis not present

## 2019-12-20 DIAGNOSIS — N3281 Overactive bladder: Secondary | ICD-10-CM | POA: Diagnosis not present

## 2019-12-20 DIAGNOSIS — F418 Other specified anxiety disorders: Secondary | ICD-10-CM | POA: Diagnosis not present

## 2019-12-20 DIAGNOSIS — B449 Aspergillosis, unspecified: Secondary | ICD-10-CM | POA: Diagnosis not present

## 2019-12-20 DIAGNOSIS — I5042 Chronic combined systolic (congestive) and diastolic (congestive) heart failure: Secondary | ICD-10-CM | POA: Diagnosis not present

## 2019-12-20 DIAGNOSIS — I130 Hypertensive heart and chronic kidney disease with heart failure and stage 1 through stage 4 chronic kidney disease, or unspecified chronic kidney disease: Secondary | ICD-10-CM | POA: Diagnosis not present

## 2019-12-20 DIAGNOSIS — Z8673 Personal history of transient ischemic attack (TIA), and cerebral infarction without residual deficits: Secondary | ICD-10-CM | POA: Diagnosis not present

## 2019-12-20 DIAGNOSIS — K219 Gastro-esophageal reflux disease without esophagitis: Secondary | ICD-10-CM | POA: Diagnosis not present

## 2019-12-20 DIAGNOSIS — F840 Autistic disorder: Secondary | ICD-10-CM | POA: Diagnosis not present

## 2019-12-20 DIAGNOSIS — Z7401 Bed confinement status: Secondary | ICD-10-CM | POA: Diagnosis not present

## 2019-12-20 DIAGNOSIS — R159 Full incontinence of feces: Secondary | ICD-10-CM | POA: Diagnosis not present

## 2019-12-20 DIAGNOSIS — R32 Unspecified urinary incontinence: Secondary | ICD-10-CM | POA: Diagnosis not present

## 2019-12-20 DIAGNOSIS — M8580 Other specified disorders of bone density and structure, unspecified site: Secondary | ICD-10-CM | POA: Diagnosis not present

## 2019-12-20 DIAGNOSIS — E1122 Type 2 diabetes mellitus with diabetic chronic kidney disease: Secondary | ICD-10-CM | POA: Diagnosis not present

## 2019-12-20 DIAGNOSIS — Z993 Dependence on wheelchair: Secondary | ICD-10-CM | POA: Diagnosis not present

## 2019-12-20 DIAGNOSIS — Z6821 Body mass index (BMI) 21.0-21.9, adult: Secondary | ICD-10-CM | POA: Diagnosis not present

## 2019-12-20 DIAGNOSIS — Z741 Need for assistance with personal care: Secondary | ICD-10-CM | POA: Diagnosis not present

## 2019-12-20 DIAGNOSIS — E1142 Type 2 diabetes mellitus with diabetic polyneuropathy: Secondary | ICD-10-CM | POA: Diagnosis not present

## 2019-12-23 DIAGNOSIS — I5042 Chronic combined systolic (congestive) and diastolic (congestive) heart failure: Secondary | ICD-10-CM | POA: Diagnosis not present

## 2019-12-23 DIAGNOSIS — D631 Anemia in chronic kidney disease: Secondary | ICD-10-CM | POA: Diagnosis not present

## 2019-12-23 DIAGNOSIS — E1142 Type 2 diabetes mellitus with diabetic polyneuropathy: Secondary | ICD-10-CM | POA: Diagnosis not present

## 2019-12-23 DIAGNOSIS — N183 Chronic kidney disease, stage 3 unspecified: Secondary | ICD-10-CM | POA: Diagnosis not present

## 2019-12-23 DIAGNOSIS — E1122 Type 2 diabetes mellitus with diabetic chronic kidney disease: Secondary | ICD-10-CM | POA: Diagnosis not present

## 2019-12-23 DIAGNOSIS — I130 Hypertensive heart and chronic kidney disease with heart failure and stage 1 through stage 4 chronic kidney disease, or unspecified chronic kidney disease: Secondary | ICD-10-CM | POA: Diagnosis not present

## 2019-12-25 DIAGNOSIS — E1142 Type 2 diabetes mellitus with diabetic polyneuropathy: Secondary | ICD-10-CM | POA: Diagnosis not present

## 2019-12-25 DIAGNOSIS — I130 Hypertensive heart and chronic kidney disease with heart failure and stage 1 through stage 4 chronic kidney disease, or unspecified chronic kidney disease: Secondary | ICD-10-CM | POA: Diagnosis not present

## 2019-12-25 DIAGNOSIS — I5042 Chronic combined systolic (congestive) and diastolic (congestive) heart failure: Secondary | ICD-10-CM | POA: Diagnosis not present

## 2019-12-25 DIAGNOSIS — N183 Chronic kidney disease, stage 3 unspecified: Secondary | ICD-10-CM | POA: Diagnosis not present

## 2019-12-25 DIAGNOSIS — E1122 Type 2 diabetes mellitus with diabetic chronic kidney disease: Secondary | ICD-10-CM | POA: Diagnosis not present

## 2019-12-25 DIAGNOSIS — D631 Anemia in chronic kidney disease: Secondary | ICD-10-CM | POA: Diagnosis not present

## 2019-12-27 DIAGNOSIS — E785 Hyperlipidemia, unspecified: Secondary | ICD-10-CM | POA: Diagnosis not present

## 2019-12-27 DIAGNOSIS — K219 Gastro-esophageal reflux disease without esophagitis: Secondary | ICD-10-CM | POA: Diagnosis not present

## 2019-12-27 DIAGNOSIS — F840 Autistic disorder: Secondary | ICD-10-CM | POA: Diagnosis not present

## 2019-12-27 DIAGNOSIS — Z7401 Bed confinement status: Secondary | ICD-10-CM | POA: Diagnosis not present

## 2019-12-27 DIAGNOSIS — R159 Full incontinence of feces: Secondary | ICD-10-CM | POA: Diagnosis not present

## 2019-12-27 DIAGNOSIS — Z8673 Personal history of transient ischemic attack (TIA), and cerebral infarction without residual deficits: Secondary | ICD-10-CM | POA: Diagnosis not present

## 2019-12-27 DIAGNOSIS — Z8571 Personal history of Hodgkin lymphoma: Secondary | ICD-10-CM | POA: Diagnosis not present

## 2019-12-27 DIAGNOSIS — M8580 Other specified disorders of bone density and structure, unspecified site: Secondary | ICD-10-CM | POA: Diagnosis not present

## 2019-12-27 DIAGNOSIS — I5042 Chronic combined systolic (congestive) and diastolic (congestive) heart failure: Secondary | ICD-10-CM | POA: Diagnosis not present

## 2019-12-27 DIAGNOSIS — F418 Other specified anxiety disorders: Secondary | ICD-10-CM | POA: Diagnosis not present

## 2019-12-27 DIAGNOSIS — I130 Hypertensive heart and chronic kidney disease with heart failure and stage 1 through stage 4 chronic kidney disease, or unspecified chronic kidney disease: Secondary | ICD-10-CM | POA: Diagnosis not present

## 2019-12-27 DIAGNOSIS — E1142 Type 2 diabetes mellitus with diabetic polyneuropathy: Secondary | ICD-10-CM | POA: Diagnosis not present

## 2019-12-27 DIAGNOSIS — N183 Chronic kidney disease, stage 3 unspecified: Secondary | ICD-10-CM | POA: Diagnosis not present

## 2019-12-27 DIAGNOSIS — E1122 Type 2 diabetes mellitus with diabetic chronic kidney disease: Secondary | ICD-10-CM | POA: Diagnosis not present

## 2019-12-27 DIAGNOSIS — R32 Unspecified urinary incontinence: Secondary | ICD-10-CM | POA: Diagnosis not present

## 2019-12-27 DIAGNOSIS — B449 Aspergillosis, unspecified: Secondary | ICD-10-CM | POA: Diagnosis not present

## 2019-12-27 DIAGNOSIS — Z993 Dependence on wheelchair: Secondary | ICD-10-CM | POA: Diagnosis not present

## 2019-12-27 DIAGNOSIS — Z741 Need for assistance with personal care: Secondary | ICD-10-CM | POA: Diagnosis not present

## 2019-12-27 DIAGNOSIS — N3281 Overactive bladder: Secondary | ICD-10-CM | POA: Diagnosis not present

## 2019-12-27 DIAGNOSIS — D631 Anemia in chronic kidney disease: Secondary | ICD-10-CM | POA: Diagnosis not present

## 2019-12-27 DIAGNOSIS — Z6821 Body mass index (BMI) 21.0-21.9, adult: Secondary | ICD-10-CM | POA: Diagnosis not present

## 2019-12-31 DIAGNOSIS — I130 Hypertensive heart and chronic kidney disease with heart failure and stage 1 through stage 4 chronic kidney disease, or unspecified chronic kidney disease: Secondary | ICD-10-CM | POA: Diagnosis not present

## 2019-12-31 DIAGNOSIS — E1122 Type 2 diabetes mellitus with diabetic chronic kidney disease: Secondary | ICD-10-CM | POA: Diagnosis not present

## 2019-12-31 DIAGNOSIS — E1142 Type 2 diabetes mellitus with diabetic polyneuropathy: Secondary | ICD-10-CM | POA: Diagnosis not present

## 2019-12-31 DIAGNOSIS — D631 Anemia in chronic kidney disease: Secondary | ICD-10-CM | POA: Diagnosis not present

## 2019-12-31 DIAGNOSIS — N183 Chronic kidney disease, stage 3 unspecified: Secondary | ICD-10-CM | POA: Diagnosis not present

## 2019-12-31 DIAGNOSIS — I5042 Chronic combined systolic (congestive) and diastolic (congestive) heart failure: Secondary | ICD-10-CM | POA: Diagnosis not present

## 2020-01-02 DIAGNOSIS — E1142 Type 2 diabetes mellitus with diabetic polyneuropathy: Secondary | ICD-10-CM | POA: Diagnosis not present

## 2020-01-02 DIAGNOSIS — E1122 Type 2 diabetes mellitus with diabetic chronic kidney disease: Secondary | ICD-10-CM | POA: Diagnosis not present

## 2020-01-02 DIAGNOSIS — I5042 Chronic combined systolic (congestive) and diastolic (congestive) heart failure: Secondary | ICD-10-CM | POA: Diagnosis not present

## 2020-01-02 DIAGNOSIS — I130 Hypertensive heart and chronic kidney disease with heart failure and stage 1 through stage 4 chronic kidney disease, or unspecified chronic kidney disease: Secondary | ICD-10-CM | POA: Diagnosis not present

## 2020-01-02 DIAGNOSIS — N183 Chronic kidney disease, stage 3 unspecified: Secondary | ICD-10-CM | POA: Diagnosis not present

## 2020-01-02 DIAGNOSIS — D631 Anemia in chronic kidney disease: Secondary | ICD-10-CM | POA: Diagnosis not present

## 2020-01-07 DIAGNOSIS — I130 Hypertensive heart and chronic kidney disease with heart failure and stage 1 through stage 4 chronic kidney disease, or unspecified chronic kidney disease: Secondary | ICD-10-CM | POA: Diagnosis not present

## 2020-01-07 DIAGNOSIS — N183 Chronic kidney disease, stage 3 unspecified: Secondary | ICD-10-CM | POA: Diagnosis not present

## 2020-01-07 DIAGNOSIS — E1122 Type 2 diabetes mellitus with diabetic chronic kidney disease: Secondary | ICD-10-CM | POA: Diagnosis not present

## 2020-01-07 DIAGNOSIS — D631 Anemia in chronic kidney disease: Secondary | ICD-10-CM | POA: Diagnosis not present

## 2020-01-07 DIAGNOSIS — I5042 Chronic combined systolic (congestive) and diastolic (congestive) heart failure: Secondary | ICD-10-CM | POA: Diagnosis not present

## 2020-01-07 DIAGNOSIS — E1142 Type 2 diabetes mellitus with diabetic polyneuropathy: Secondary | ICD-10-CM | POA: Diagnosis not present

## 2020-01-13 DIAGNOSIS — E1142 Type 2 diabetes mellitus with diabetic polyneuropathy: Secondary | ICD-10-CM | POA: Diagnosis not present

## 2020-01-13 DIAGNOSIS — N183 Chronic kidney disease, stage 3 unspecified: Secondary | ICD-10-CM | POA: Diagnosis not present

## 2020-01-13 DIAGNOSIS — D631 Anemia in chronic kidney disease: Secondary | ICD-10-CM | POA: Diagnosis not present

## 2020-01-13 DIAGNOSIS — I130 Hypertensive heart and chronic kidney disease with heart failure and stage 1 through stage 4 chronic kidney disease, or unspecified chronic kidney disease: Secondary | ICD-10-CM | POA: Diagnosis not present

## 2020-01-13 DIAGNOSIS — I5042 Chronic combined systolic (congestive) and diastolic (congestive) heart failure: Secondary | ICD-10-CM | POA: Diagnosis not present

## 2020-01-13 DIAGNOSIS — E1122 Type 2 diabetes mellitus with diabetic chronic kidney disease: Secondary | ICD-10-CM | POA: Diagnosis not present

## 2020-01-14 DIAGNOSIS — E1142 Type 2 diabetes mellitus with diabetic polyneuropathy: Secondary | ICD-10-CM | POA: Diagnosis not present

## 2020-01-14 DIAGNOSIS — D631 Anemia in chronic kidney disease: Secondary | ICD-10-CM | POA: Diagnosis not present

## 2020-01-14 DIAGNOSIS — E1122 Type 2 diabetes mellitus with diabetic chronic kidney disease: Secondary | ICD-10-CM | POA: Diagnosis not present

## 2020-01-14 DIAGNOSIS — I130 Hypertensive heart and chronic kidney disease with heart failure and stage 1 through stage 4 chronic kidney disease, or unspecified chronic kidney disease: Secondary | ICD-10-CM | POA: Diagnosis not present

## 2020-01-14 DIAGNOSIS — I5042 Chronic combined systolic (congestive) and diastolic (congestive) heart failure: Secondary | ICD-10-CM | POA: Diagnosis not present

## 2020-01-14 DIAGNOSIS — N183 Chronic kidney disease, stage 3 unspecified: Secondary | ICD-10-CM | POA: Diagnosis not present

## 2020-01-15 ENCOUNTER — Ambulatory Visit: Payer: Medicare Other | Admitting: Podiatry

## 2020-01-20 ENCOUNTER — Ambulatory Visit: Payer: Medicare Other | Admitting: Podiatrist

## 2020-01-21 DIAGNOSIS — I5042 Chronic combined systolic (congestive) and diastolic (congestive) heart failure: Secondary | ICD-10-CM | POA: Diagnosis not present

## 2020-01-21 DIAGNOSIS — E1142 Type 2 diabetes mellitus with diabetic polyneuropathy: Secondary | ICD-10-CM | POA: Diagnosis not present

## 2020-01-21 DIAGNOSIS — D631 Anemia in chronic kidney disease: Secondary | ICD-10-CM | POA: Diagnosis not present

## 2020-01-21 DIAGNOSIS — I130 Hypertensive heart and chronic kidney disease with heart failure and stage 1 through stage 4 chronic kidney disease, or unspecified chronic kidney disease: Secondary | ICD-10-CM | POA: Diagnosis not present

## 2020-01-21 DIAGNOSIS — N183 Chronic kidney disease, stage 3 unspecified: Secondary | ICD-10-CM | POA: Diagnosis not present

## 2020-01-21 DIAGNOSIS — E1122 Type 2 diabetes mellitus with diabetic chronic kidney disease: Secondary | ICD-10-CM | POA: Diagnosis not present

## 2020-01-24 DIAGNOSIS — E785 Hyperlipidemia, unspecified: Secondary | ICD-10-CM | POA: Diagnosis not present

## 2020-01-24 DIAGNOSIS — D631 Anemia in chronic kidney disease: Secondary | ICD-10-CM | POA: Diagnosis not present

## 2020-01-24 DIAGNOSIS — N1831 Chronic kidney disease, stage 3a: Secondary | ICD-10-CM | POA: Diagnosis not present

## 2020-01-24 DIAGNOSIS — N183 Chronic kidney disease, stage 3 unspecified: Secondary | ICD-10-CM | POA: Diagnosis not present

## 2020-01-24 DIAGNOSIS — E1122 Type 2 diabetes mellitus with diabetic chronic kidney disease: Secondary | ICD-10-CM | POA: Diagnosis not present

## 2020-01-24 DIAGNOSIS — I5042 Chronic combined systolic (congestive) and diastolic (congestive) heart failure: Secondary | ICD-10-CM | POA: Diagnosis not present

## 2020-01-24 DIAGNOSIS — E1142 Type 2 diabetes mellitus with diabetic polyneuropathy: Secondary | ICD-10-CM | POA: Diagnosis not present

## 2020-01-24 DIAGNOSIS — I130 Hypertensive heart and chronic kidney disease with heart failure and stage 1 through stage 4 chronic kidney disease, or unspecified chronic kidney disease: Secondary | ICD-10-CM | POA: Diagnosis not present

## 2020-01-24 DIAGNOSIS — I129 Hypertensive chronic kidney disease with stage 1 through stage 4 chronic kidney disease, or unspecified chronic kidney disease: Secondary | ICD-10-CM | POA: Diagnosis not present

## 2020-01-24 DIAGNOSIS — I5032 Chronic diastolic (congestive) heart failure: Secondary | ICD-10-CM | POA: Diagnosis not present

## 2020-01-27 DIAGNOSIS — B449 Aspergillosis, unspecified: Secondary | ICD-10-CM | POA: Diagnosis not present

## 2020-01-27 DIAGNOSIS — E1142 Type 2 diabetes mellitus with diabetic polyneuropathy: Secondary | ICD-10-CM | POA: Diagnosis not present

## 2020-01-27 DIAGNOSIS — Z8571 Personal history of Hodgkin lymphoma: Secondary | ICD-10-CM | POA: Diagnosis not present

## 2020-01-27 DIAGNOSIS — Z741 Need for assistance with personal care: Secondary | ICD-10-CM | POA: Diagnosis not present

## 2020-01-27 DIAGNOSIS — N3281 Overactive bladder: Secondary | ICD-10-CM | POA: Diagnosis not present

## 2020-01-27 DIAGNOSIS — E1122 Type 2 diabetes mellitus with diabetic chronic kidney disease: Secondary | ICD-10-CM | POA: Diagnosis not present

## 2020-01-27 DIAGNOSIS — Z993 Dependence on wheelchair: Secondary | ICD-10-CM | POA: Diagnosis not present

## 2020-01-27 DIAGNOSIS — R32 Unspecified urinary incontinence: Secondary | ICD-10-CM | POA: Diagnosis not present

## 2020-01-27 DIAGNOSIS — Z6821 Body mass index (BMI) 21.0-21.9, adult: Secondary | ICD-10-CM | POA: Diagnosis not present

## 2020-01-27 DIAGNOSIS — R159 Full incontinence of feces: Secondary | ICD-10-CM | POA: Diagnosis not present

## 2020-01-27 DIAGNOSIS — E785 Hyperlipidemia, unspecified: Secondary | ICD-10-CM | POA: Diagnosis not present

## 2020-01-27 DIAGNOSIS — F418 Other specified anxiety disorders: Secondary | ICD-10-CM | POA: Diagnosis not present

## 2020-01-27 DIAGNOSIS — N183 Chronic kidney disease, stage 3 unspecified: Secondary | ICD-10-CM | POA: Diagnosis not present

## 2020-01-27 DIAGNOSIS — I130 Hypertensive heart and chronic kidney disease with heart failure and stage 1 through stage 4 chronic kidney disease, or unspecified chronic kidney disease: Secondary | ICD-10-CM | POA: Diagnosis not present

## 2020-01-27 DIAGNOSIS — Z8673 Personal history of transient ischemic attack (TIA), and cerebral infarction without residual deficits: Secondary | ICD-10-CM | POA: Diagnosis not present

## 2020-01-27 DIAGNOSIS — F840 Autistic disorder: Secondary | ICD-10-CM | POA: Diagnosis not present

## 2020-01-27 DIAGNOSIS — Z7401 Bed confinement status: Secondary | ICD-10-CM | POA: Diagnosis not present

## 2020-01-27 DIAGNOSIS — I5042 Chronic combined systolic (congestive) and diastolic (congestive) heart failure: Secondary | ICD-10-CM | POA: Diagnosis not present

## 2020-01-27 DIAGNOSIS — K219 Gastro-esophageal reflux disease without esophagitis: Secondary | ICD-10-CM | POA: Diagnosis not present

## 2020-01-27 DIAGNOSIS — D631 Anemia in chronic kidney disease: Secondary | ICD-10-CM | POA: Diagnosis not present

## 2020-01-27 DIAGNOSIS — M8580 Other specified disorders of bone density and structure, unspecified site: Secondary | ICD-10-CM | POA: Diagnosis not present

## 2020-01-28 DIAGNOSIS — I5042 Chronic combined systolic (congestive) and diastolic (congestive) heart failure: Secondary | ICD-10-CM | POA: Diagnosis not present

## 2020-01-28 DIAGNOSIS — D631 Anemia in chronic kidney disease: Secondary | ICD-10-CM | POA: Diagnosis not present

## 2020-01-28 DIAGNOSIS — E1122 Type 2 diabetes mellitus with diabetic chronic kidney disease: Secondary | ICD-10-CM | POA: Diagnosis not present

## 2020-01-28 DIAGNOSIS — I130 Hypertensive heart and chronic kidney disease with heart failure and stage 1 through stage 4 chronic kidney disease, or unspecified chronic kidney disease: Secondary | ICD-10-CM | POA: Diagnosis not present

## 2020-01-28 DIAGNOSIS — E1142 Type 2 diabetes mellitus with diabetic polyneuropathy: Secondary | ICD-10-CM | POA: Diagnosis not present

## 2020-01-28 DIAGNOSIS — N183 Chronic kidney disease, stage 3 unspecified: Secondary | ICD-10-CM | POA: Diagnosis not present

## 2020-01-29 DIAGNOSIS — N183 Chronic kidney disease, stage 3 unspecified: Secondary | ICD-10-CM | POA: Diagnosis not present

## 2020-01-29 DIAGNOSIS — I5042 Chronic combined systolic (congestive) and diastolic (congestive) heart failure: Secondary | ICD-10-CM | POA: Diagnosis not present

## 2020-01-29 DIAGNOSIS — I130 Hypertensive heart and chronic kidney disease with heart failure and stage 1 through stage 4 chronic kidney disease, or unspecified chronic kidney disease: Secondary | ICD-10-CM | POA: Diagnosis not present

## 2020-01-29 DIAGNOSIS — D631 Anemia in chronic kidney disease: Secondary | ICD-10-CM | POA: Diagnosis not present

## 2020-01-29 DIAGNOSIS — E1142 Type 2 diabetes mellitus with diabetic polyneuropathy: Secondary | ICD-10-CM | POA: Diagnosis not present

## 2020-01-29 DIAGNOSIS — E1122 Type 2 diabetes mellitus with diabetic chronic kidney disease: Secondary | ICD-10-CM | POA: Diagnosis not present

## 2020-01-30 DIAGNOSIS — Z23 Encounter for immunization: Secondary | ICD-10-CM | POA: Diagnosis not present

## 2020-02-07 DIAGNOSIS — I5042 Chronic combined systolic (congestive) and diastolic (congestive) heart failure: Secondary | ICD-10-CM | POA: Diagnosis not present

## 2020-02-07 DIAGNOSIS — N183 Chronic kidney disease, stage 3 unspecified: Secondary | ICD-10-CM | POA: Diagnosis not present

## 2020-02-07 DIAGNOSIS — I130 Hypertensive heart and chronic kidney disease with heart failure and stage 1 through stage 4 chronic kidney disease, or unspecified chronic kidney disease: Secondary | ICD-10-CM | POA: Diagnosis not present

## 2020-02-07 DIAGNOSIS — D631 Anemia in chronic kidney disease: Secondary | ICD-10-CM | POA: Diagnosis not present

## 2020-02-07 DIAGNOSIS — E1142 Type 2 diabetes mellitus with diabetic polyneuropathy: Secondary | ICD-10-CM | POA: Diagnosis not present

## 2020-02-07 DIAGNOSIS — E1122 Type 2 diabetes mellitus with diabetic chronic kidney disease: Secondary | ICD-10-CM | POA: Diagnosis not present

## 2020-02-10 DIAGNOSIS — D631 Anemia in chronic kidney disease: Secondary | ICD-10-CM | POA: Diagnosis not present

## 2020-02-10 DIAGNOSIS — N183 Chronic kidney disease, stage 3 unspecified: Secondary | ICD-10-CM | POA: Diagnosis not present

## 2020-02-10 DIAGNOSIS — I5042 Chronic combined systolic (congestive) and diastolic (congestive) heart failure: Secondary | ICD-10-CM | POA: Diagnosis not present

## 2020-02-10 DIAGNOSIS — E1122 Type 2 diabetes mellitus with diabetic chronic kidney disease: Secondary | ICD-10-CM | POA: Diagnosis not present

## 2020-02-10 DIAGNOSIS — I130 Hypertensive heart and chronic kidney disease with heart failure and stage 1 through stage 4 chronic kidney disease, or unspecified chronic kidney disease: Secondary | ICD-10-CM | POA: Diagnosis not present

## 2020-02-10 DIAGNOSIS — E1142 Type 2 diabetes mellitus with diabetic polyneuropathy: Secondary | ICD-10-CM | POA: Diagnosis not present

## 2020-02-18 DIAGNOSIS — N183 Chronic kidney disease, stage 3 unspecified: Secondary | ICD-10-CM | POA: Diagnosis not present

## 2020-02-18 DIAGNOSIS — E1122 Type 2 diabetes mellitus with diabetic chronic kidney disease: Secondary | ICD-10-CM | POA: Diagnosis not present

## 2020-02-18 DIAGNOSIS — D631 Anemia in chronic kidney disease: Secondary | ICD-10-CM | POA: Diagnosis not present

## 2020-02-18 DIAGNOSIS — E1142 Type 2 diabetes mellitus with diabetic polyneuropathy: Secondary | ICD-10-CM | POA: Diagnosis not present

## 2020-02-18 DIAGNOSIS — I130 Hypertensive heart and chronic kidney disease with heart failure and stage 1 through stage 4 chronic kidney disease, or unspecified chronic kidney disease: Secondary | ICD-10-CM | POA: Diagnosis not present

## 2020-02-18 DIAGNOSIS — I5042 Chronic combined systolic (congestive) and diastolic (congestive) heart failure: Secondary | ICD-10-CM | POA: Diagnosis not present

## 2020-02-25 DIAGNOSIS — I130 Hypertensive heart and chronic kidney disease with heart failure and stage 1 through stage 4 chronic kidney disease, or unspecified chronic kidney disease: Secondary | ICD-10-CM | POA: Diagnosis not present

## 2020-02-25 DIAGNOSIS — D631 Anemia in chronic kidney disease: Secondary | ICD-10-CM | POA: Diagnosis not present

## 2020-02-25 DIAGNOSIS — E1122 Type 2 diabetes mellitus with diabetic chronic kidney disease: Secondary | ICD-10-CM | POA: Diagnosis not present

## 2020-02-25 DIAGNOSIS — N183 Chronic kidney disease, stage 3 unspecified: Secondary | ICD-10-CM | POA: Diagnosis not present

## 2020-02-25 DIAGNOSIS — E1142 Type 2 diabetes mellitus with diabetic polyneuropathy: Secondary | ICD-10-CM | POA: Diagnosis not present

## 2020-02-25 DIAGNOSIS — I5042 Chronic combined systolic (congestive) and diastolic (congestive) heart failure: Secondary | ICD-10-CM | POA: Diagnosis not present

## 2020-02-26 DIAGNOSIS — N183 Chronic kidney disease, stage 3 unspecified: Secondary | ICD-10-CM | POA: Diagnosis not present

## 2020-02-26 DIAGNOSIS — E785 Hyperlipidemia, unspecified: Secondary | ICD-10-CM | POA: Diagnosis not present

## 2020-02-26 DIAGNOSIS — R32 Unspecified urinary incontinence: Secondary | ICD-10-CM | POA: Diagnosis not present

## 2020-02-26 DIAGNOSIS — F840 Autistic disorder: Secondary | ICD-10-CM | POA: Diagnosis not present

## 2020-02-26 DIAGNOSIS — Z6821 Body mass index (BMI) 21.0-21.9, adult: Secondary | ICD-10-CM | POA: Diagnosis not present

## 2020-02-26 DIAGNOSIS — Z8673 Personal history of transient ischemic attack (TIA), and cerebral infarction without residual deficits: Secondary | ICD-10-CM | POA: Diagnosis not present

## 2020-02-26 DIAGNOSIS — D631 Anemia in chronic kidney disease: Secondary | ICD-10-CM | POA: Diagnosis not present

## 2020-02-26 DIAGNOSIS — B449 Aspergillosis, unspecified: Secondary | ICD-10-CM | POA: Diagnosis not present

## 2020-02-26 DIAGNOSIS — N3281 Overactive bladder: Secondary | ICD-10-CM | POA: Diagnosis not present

## 2020-02-26 DIAGNOSIS — Z741 Need for assistance with personal care: Secondary | ICD-10-CM | POA: Diagnosis not present

## 2020-02-26 DIAGNOSIS — Z7401 Bed confinement status: Secondary | ICD-10-CM | POA: Diagnosis not present

## 2020-02-26 DIAGNOSIS — F418 Other specified anxiety disorders: Secondary | ICD-10-CM | POA: Diagnosis not present

## 2020-02-26 DIAGNOSIS — K219 Gastro-esophageal reflux disease without esophagitis: Secondary | ICD-10-CM | POA: Diagnosis not present

## 2020-02-26 DIAGNOSIS — I5042 Chronic combined systolic (congestive) and diastolic (congestive) heart failure: Secondary | ICD-10-CM | POA: Diagnosis not present

## 2020-02-26 DIAGNOSIS — Z993 Dependence on wheelchair: Secondary | ICD-10-CM | POA: Diagnosis not present

## 2020-02-26 DIAGNOSIS — I130 Hypertensive heart and chronic kidney disease with heart failure and stage 1 through stage 4 chronic kidney disease, or unspecified chronic kidney disease: Secondary | ICD-10-CM | POA: Diagnosis not present

## 2020-02-26 DIAGNOSIS — M8580 Other specified disorders of bone density and structure, unspecified site: Secondary | ICD-10-CM | POA: Diagnosis not present

## 2020-02-26 DIAGNOSIS — Z8571 Personal history of Hodgkin lymphoma: Secondary | ICD-10-CM | POA: Diagnosis not present

## 2020-02-26 DIAGNOSIS — E1122 Type 2 diabetes mellitus with diabetic chronic kidney disease: Secondary | ICD-10-CM | POA: Diagnosis not present

## 2020-02-26 DIAGNOSIS — E1142 Type 2 diabetes mellitus with diabetic polyneuropathy: Secondary | ICD-10-CM | POA: Diagnosis not present

## 2020-02-26 DIAGNOSIS — R159 Full incontinence of feces: Secondary | ICD-10-CM | POA: Diagnosis not present

## 2020-03-04 DIAGNOSIS — E1122 Type 2 diabetes mellitus with diabetic chronic kidney disease: Secondary | ICD-10-CM | POA: Diagnosis not present

## 2020-03-04 DIAGNOSIS — E1142 Type 2 diabetes mellitus with diabetic polyneuropathy: Secondary | ICD-10-CM | POA: Diagnosis not present

## 2020-03-04 DIAGNOSIS — N183 Chronic kidney disease, stage 3 unspecified: Secondary | ICD-10-CM | POA: Diagnosis not present

## 2020-03-04 DIAGNOSIS — D631 Anemia in chronic kidney disease: Secondary | ICD-10-CM | POA: Diagnosis not present

## 2020-03-04 DIAGNOSIS — I130 Hypertensive heart and chronic kidney disease with heart failure and stage 1 through stage 4 chronic kidney disease, or unspecified chronic kidney disease: Secondary | ICD-10-CM | POA: Diagnosis not present

## 2020-03-04 DIAGNOSIS — I5042 Chronic combined systolic (congestive) and diastolic (congestive) heart failure: Secondary | ICD-10-CM | POA: Diagnosis not present

## 2020-03-09 DIAGNOSIS — I5042 Chronic combined systolic (congestive) and diastolic (congestive) heart failure: Secondary | ICD-10-CM | POA: Diagnosis not present

## 2020-03-09 DIAGNOSIS — I130 Hypertensive heart and chronic kidney disease with heart failure and stage 1 through stage 4 chronic kidney disease, or unspecified chronic kidney disease: Secondary | ICD-10-CM | POA: Diagnosis not present

## 2020-03-09 DIAGNOSIS — N183 Chronic kidney disease, stage 3 unspecified: Secondary | ICD-10-CM | POA: Diagnosis not present

## 2020-03-09 DIAGNOSIS — E1142 Type 2 diabetes mellitus with diabetic polyneuropathy: Secondary | ICD-10-CM | POA: Diagnosis not present

## 2020-03-09 DIAGNOSIS — E1122 Type 2 diabetes mellitus with diabetic chronic kidney disease: Secondary | ICD-10-CM | POA: Diagnosis not present

## 2020-03-09 DIAGNOSIS — D631 Anemia in chronic kidney disease: Secondary | ICD-10-CM | POA: Diagnosis not present

## 2020-03-10 DIAGNOSIS — N183 Chronic kidney disease, stage 3 unspecified: Secondary | ICD-10-CM | POA: Diagnosis not present

## 2020-03-10 DIAGNOSIS — D631 Anemia in chronic kidney disease: Secondary | ICD-10-CM | POA: Diagnosis not present

## 2020-03-10 DIAGNOSIS — I130 Hypertensive heart and chronic kidney disease with heart failure and stage 1 through stage 4 chronic kidney disease, or unspecified chronic kidney disease: Secondary | ICD-10-CM | POA: Diagnosis not present

## 2020-03-10 DIAGNOSIS — E1142 Type 2 diabetes mellitus with diabetic polyneuropathy: Secondary | ICD-10-CM | POA: Diagnosis not present

## 2020-03-10 DIAGNOSIS — E1122 Type 2 diabetes mellitus with diabetic chronic kidney disease: Secondary | ICD-10-CM | POA: Diagnosis not present

## 2020-03-10 DIAGNOSIS — I5042 Chronic combined systolic (congestive) and diastolic (congestive) heart failure: Secondary | ICD-10-CM | POA: Diagnosis not present

## 2020-03-13 DIAGNOSIS — E1122 Type 2 diabetes mellitus with diabetic chronic kidney disease: Secondary | ICD-10-CM | POA: Diagnosis not present

## 2020-03-13 DIAGNOSIS — N183 Chronic kidney disease, stage 3 unspecified: Secondary | ICD-10-CM | POA: Diagnosis not present

## 2020-03-13 DIAGNOSIS — E1142 Type 2 diabetes mellitus with diabetic polyneuropathy: Secondary | ICD-10-CM | POA: Diagnosis not present

## 2020-03-13 DIAGNOSIS — I5042 Chronic combined systolic (congestive) and diastolic (congestive) heart failure: Secondary | ICD-10-CM | POA: Diagnosis not present

## 2020-03-13 DIAGNOSIS — I130 Hypertensive heart and chronic kidney disease with heart failure and stage 1 through stage 4 chronic kidney disease, or unspecified chronic kidney disease: Secondary | ICD-10-CM | POA: Diagnosis not present

## 2020-03-13 DIAGNOSIS — D631 Anemia in chronic kidney disease: Secondary | ICD-10-CM | POA: Diagnosis not present

## 2020-03-27 DIAGNOSIS — I130 Hypertensive heart and chronic kidney disease with heart failure and stage 1 through stage 4 chronic kidney disease, or unspecified chronic kidney disease: Secondary | ICD-10-CM | POA: Diagnosis not present

## 2020-03-27 DIAGNOSIS — D631 Anemia in chronic kidney disease: Secondary | ICD-10-CM | POA: Diagnosis not present

## 2020-03-27 DIAGNOSIS — N183 Chronic kidney disease, stage 3 unspecified: Secondary | ICD-10-CM | POA: Diagnosis not present

## 2020-03-27 DIAGNOSIS — E1122 Type 2 diabetes mellitus with diabetic chronic kidney disease: Secondary | ICD-10-CM | POA: Diagnosis not present

## 2020-03-27 DIAGNOSIS — E1142 Type 2 diabetes mellitus with diabetic polyneuropathy: Secondary | ICD-10-CM | POA: Diagnosis not present

## 2020-03-27 DIAGNOSIS — I5042 Chronic combined systolic (congestive) and diastolic (congestive) heart failure: Secondary | ICD-10-CM | POA: Diagnosis not present

## 2020-03-28 DIAGNOSIS — E1122 Type 2 diabetes mellitus with diabetic chronic kidney disease: Secondary | ICD-10-CM | POA: Diagnosis not present

## 2020-03-28 DIAGNOSIS — Z993 Dependence on wheelchair: Secondary | ICD-10-CM | POA: Diagnosis not present

## 2020-03-28 DIAGNOSIS — F418 Other specified anxiety disorders: Secondary | ICD-10-CM | POA: Diagnosis not present

## 2020-03-28 DIAGNOSIS — E1142 Type 2 diabetes mellitus with diabetic polyneuropathy: Secondary | ICD-10-CM | POA: Diagnosis not present

## 2020-03-28 DIAGNOSIS — M8580 Other specified disorders of bone density and structure, unspecified site: Secondary | ICD-10-CM | POA: Diagnosis not present

## 2020-03-28 DIAGNOSIS — W19XXXD Unspecified fall, subsequent encounter: Secondary | ICD-10-CM | POA: Diagnosis not present

## 2020-03-28 DIAGNOSIS — N3281 Overactive bladder: Secondary | ICD-10-CM | POA: Diagnosis not present

## 2020-03-28 DIAGNOSIS — B449 Aspergillosis, unspecified: Secondary | ICD-10-CM | POA: Diagnosis not present

## 2020-03-28 DIAGNOSIS — Z7401 Bed confinement status: Secondary | ICD-10-CM | POA: Diagnosis not present

## 2020-03-28 DIAGNOSIS — R32 Unspecified urinary incontinence: Secondary | ICD-10-CM | POA: Diagnosis not present

## 2020-03-28 DIAGNOSIS — Z6821 Body mass index (BMI) 21.0-21.9, adult: Secondary | ICD-10-CM | POA: Diagnosis not present

## 2020-03-28 DIAGNOSIS — F840 Autistic disorder: Secondary | ICD-10-CM | POA: Diagnosis not present

## 2020-03-28 DIAGNOSIS — I130 Hypertensive heart and chronic kidney disease with heart failure and stage 1 through stage 4 chronic kidney disease, or unspecified chronic kidney disease: Secondary | ICD-10-CM | POA: Diagnosis not present

## 2020-03-28 DIAGNOSIS — E785 Hyperlipidemia, unspecified: Secondary | ICD-10-CM | POA: Diagnosis not present

## 2020-03-28 DIAGNOSIS — K219 Gastro-esophageal reflux disease without esophagitis: Secondary | ICD-10-CM | POA: Diagnosis not present

## 2020-03-28 DIAGNOSIS — Z741 Need for assistance with personal care: Secondary | ICD-10-CM | POA: Diagnosis not present

## 2020-03-28 DIAGNOSIS — N183 Chronic kidney disease, stage 3 unspecified: Secondary | ICD-10-CM | POA: Diagnosis not present

## 2020-03-28 DIAGNOSIS — I5042 Chronic combined systolic (congestive) and diastolic (congestive) heart failure: Secondary | ICD-10-CM | POA: Diagnosis not present

## 2020-03-28 DIAGNOSIS — R159 Full incontinence of feces: Secondary | ICD-10-CM | POA: Diagnosis not present

## 2020-03-28 DIAGNOSIS — Z8571 Personal history of Hodgkin lymphoma: Secondary | ICD-10-CM | POA: Diagnosis not present

## 2020-03-28 DIAGNOSIS — Z8673 Personal history of transient ischemic attack (TIA), and cerebral infarction without residual deficits: Secondary | ICD-10-CM | POA: Diagnosis not present

## 2020-03-28 DIAGNOSIS — D631 Anemia in chronic kidney disease: Secondary | ICD-10-CM | POA: Diagnosis not present

## 2020-03-31 DIAGNOSIS — I130 Hypertensive heart and chronic kidney disease with heart failure and stage 1 through stage 4 chronic kidney disease, or unspecified chronic kidney disease: Secondary | ICD-10-CM | POA: Diagnosis not present

## 2020-03-31 DIAGNOSIS — E1122 Type 2 diabetes mellitus with diabetic chronic kidney disease: Secondary | ICD-10-CM | POA: Diagnosis not present

## 2020-03-31 DIAGNOSIS — D631 Anemia in chronic kidney disease: Secondary | ICD-10-CM | POA: Diagnosis not present

## 2020-03-31 DIAGNOSIS — N183 Chronic kidney disease, stage 3 unspecified: Secondary | ICD-10-CM | POA: Diagnosis not present

## 2020-03-31 DIAGNOSIS — E1142 Type 2 diabetes mellitus with diabetic polyneuropathy: Secondary | ICD-10-CM | POA: Diagnosis not present

## 2020-03-31 DIAGNOSIS — I5042 Chronic combined systolic (congestive) and diastolic (congestive) heart failure: Secondary | ICD-10-CM | POA: Diagnosis not present

## 2020-04-02 DIAGNOSIS — E1122 Type 2 diabetes mellitus with diabetic chronic kidney disease: Secondary | ICD-10-CM | POA: Diagnosis not present

## 2020-04-02 DIAGNOSIS — I5042 Chronic combined systolic (congestive) and diastolic (congestive) heart failure: Secondary | ICD-10-CM | POA: Diagnosis not present

## 2020-04-02 DIAGNOSIS — D631 Anemia in chronic kidney disease: Secondary | ICD-10-CM | POA: Diagnosis not present

## 2020-04-02 DIAGNOSIS — N183 Chronic kidney disease, stage 3 unspecified: Secondary | ICD-10-CM | POA: Diagnosis not present

## 2020-04-02 DIAGNOSIS — I130 Hypertensive heart and chronic kidney disease with heart failure and stage 1 through stage 4 chronic kidney disease, or unspecified chronic kidney disease: Secondary | ICD-10-CM | POA: Diagnosis not present

## 2020-04-02 DIAGNOSIS — E1142 Type 2 diabetes mellitus with diabetic polyneuropathy: Secondary | ICD-10-CM | POA: Diagnosis not present

## 2020-04-07 DIAGNOSIS — I5042 Chronic combined systolic (congestive) and diastolic (congestive) heart failure: Secondary | ICD-10-CM | POA: Diagnosis not present

## 2020-04-07 DIAGNOSIS — N183 Chronic kidney disease, stage 3 unspecified: Secondary | ICD-10-CM | POA: Diagnosis not present

## 2020-04-07 DIAGNOSIS — I130 Hypertensive heart and chronic kidney disease with heart failure and stage 1 through stage 4 chronic kidney disease, or unspecified chronic kidney disease: Secondary | ICD-10-CM | POA: Diagnosis not present

## 2020-04-07 DIAGNOSIS — E1122 Type 2 diabetes mellitus with diabetic chronic kidney disease: Secondary | ICD-10-CM | POA: Diagnosis not present

## 2020-04-08 DIAGNOSIS — D631 Anemia in chronic kidney disease: Secondary | ICD-10-CM | POA: Diagnosis not present

## 2020-04-08 DIAGNOSIS — N183 Chronic kidney disease, stage 3 unspecified: Secondary | ICD-10-CM | POA: Diagnosis not present

## 2020-04-08 DIAGNOSIS — I5042 Chronic combined systolic (congestive) and diastolic (congestive) heart failure: Secondary | ICD-10-CM | POA: Diagnosis not present

## 2020-04-08 DIAGNOSIS — E1142 Type 2 diabetes mellitus with diabetic polyneuropathy: Secondary | ICD-10-CM | POA: Diagnosis not present

## 2020-04-08 DIAGNOSIS — E1122 Type 2 diabetes mellitus with diabetic chronic kidney disease: Secondary | ICD-10-CM | POA: Diagnosis not present

## 2020-04-08 DIAGNOSIS — I130 Hypertensive heart and chronic kidney disease with heart failure and stage 1 through stage 4 chronic kidney disease, or unspecified chronic kidney disease: Secondary | ICD-10-CM | POA: Diagnosis not present

## 2020-04-09 DIAGNOSIS — I5042 Chronic combined systolic (congestive) and diastolic (congestive) heart failure: Secondary | ICD-10-CM | POA: Diagnosis not present

## 2020-04-09 DIAGNOSIS — I130 Hypertensive heart and chronic kidney disease with heart failure and stage 1 through stage 4 chronic kidney disease, or unspecified chronic kidney disease: Secondary | ICD-10-CM | POA: Diagnosis not present

## 2020-04-09 DIAGNOSIS — N183 Chronic kidney disease, stage 3 unspecified: Secondary | ICD-10-CM | POA: Diagnosis not present

## 2020-04-09 DIAGNOSIS — E1122 Type 2 diabetes mellitus with diabetic chronic kidney disease: Secondary | ICD-10-CM | POA: Diagnosis not present

## 2020-04-09 DIAGNOSIS — D631 Anemia in chronic kidney disease: Secondary | ICD-10-CM | POA: Diagnosis not present

## 2020-04-09 DIAGNOSIS — E1142 Type 2 diabetes mellitus with diabetic polyneuropathy: Secondary | ICD-10-CM | POA: Diagnosis not present

## 2020-04-10 DIAGNOSIS — E1142 Type 2 diabetes mellitus with diabetic polyneuropathy: Secondary | ICD-10-CM | POA: Diagnosis not present

## 2020-04-10 DIAGNOSIS — E1122 Type 2 diabetes mellitus with diabetic chronic kidney disease: Secondary | ICD-10-CM | POA: Diagnosis not present

## 2020-04-10 DIAGNOSIS — D631 Anemia in chronic kidney disease: Secondary | ICD-10-CM | POA: Diagnosis not present

## 2020-04-10 DIAGNOSIS — I5042 Chronic combined systolic (congestive) and diastolic (congestive) heart failure: Secondary | ICD-10-CM | POA: Diagnosis not present

## 2020-04-10 DIAGNOSIS — I130 Hypertensive heart and chronic kidney disease with heart failure and stage 1 through stage 4 chronic kidney disease, or unspecified chronic kidney disease: Secondary | ICD-10-CM | POA: Diagnosis not present

## 2020-04-10 DIAGNOSIS — N183 Chronic kidney disease, stage 3 unspecified: Secondary | ICD-10-CM | POA: Diagnosis not present

## 2020-04-17 DIAGNOSIS — I130 Hypertensive heart and chronic kidney disease with heart failure and stage 1 through stage 4 chronic kidney disease, or unspecified chronic kidney disease: Secondary | ICD-10-CM | POA: Diagnosis not present

## 2020-04-17 DIAGNOSIS — N183 Chronic kidney disease, stage 3 unspecified: Secondary | ICD-10-CM | POA: Diagnosis not present

## 2020-04-17 DIAGNOSIS — I5042 Chronic combined systolic (congestive) and diastolic (congestive) heart failure: Secondary | ICD-10-CM | POA: Diagnosis not present

## 2020-04-17 DIAGNOSIS — E1142 Type 2 diabetes mellitus with diabetic polyneuropathy: Secondary | ICD-10-CM | POA: Diagnosis not present

## 2020-04-17 DIAGNOSIS — E1122 Type 2 diabetes mellitus with diabetic chronic kidney disease: Secondary | ICD-10-CM | POA: Diagnosis not present

## 2020-04-17 DIAGNOSIS — D631 Anemia in chronic kidney disease: Secondary | ICD-10-CM | POA: Diagnosis not present

## 2020-04-20 DIAGNOSIS — E1142 Type 2 diabetes mellitus with diabetic polyneuropathy: Secondary | ICD-10-CM | POA: Diagnosis not present

## 2020-04-20 DIAGNOSIS — I5042 Chronic combined systolic (congestive) and diastolic (congestive) heart failure: Secondary | ICD-10-CM | POA: Diagnosis not present

## 2020-04-20 DIAGNOSIS — I130 Hypertensive heart and chronic kidney disease with heart failure and stage 1 through stage 4 chronic kidney disease, or unspecified chronic kidney disease: Secondary | ICD-10-CM | POA: Diagnosis not present

## 2020-04-20 DIAGNOSIS — N183 Chronic kidney disease, stage 3 unspecified: Secondary | ICD-10-CM | POA: Diagnosis not present

## 2020-04-20 DIAGNOSIS — E1122 Type 2 diabetes mellitus with diabetic chronic kidney disease: Secondary | ICD-10-CM | POA: Diagnosis not present

## 2020-04-20 DIAGNOSIS — D631 Anemia in chronic kidney disease: Secondary | ICD-10-CM | POA: Diagnosis not present

## 2020-04-22 DIAGNOSIS — E1122 Type 2 diabetes mellitus with diabetic chronic kidney disease: Secondary | ICD-10-CM | POA: Diagnosis not present

## 2020-04-22 DIAGNOSIS — D631 Anemia in chronic kidney disease: Secondary | ICD-10-CM | POA: Diagnosis not present

## 2020-04-22 DIAGNOSIS — E1142 Type 2 diabetes mellitus with diabetic polyneuropathy: Secondary | ICD-10-CM | POA: Diagnosis not present

## 2020-04-22 DIAGNOSIS — I130 Hypertensive heart and chronic kidney disease with heart failure and stage 1 through stage 4 chronic kidney disease, or unspecified chronic kidney disease: Secondary | ICD-10-CM | POA: Diagnosis not present

## 2020-04-22 DIAGNOSIS — N183 Chronic kidney disease, stage 3 unspecified: Secondary | ICD-10-CM | POA: Diagnosis not present

## 2020-04-22 DIAGNOSIS — I5042 Chronic combined systolic (congestive) and diastolic (congestive) heart failure: Secondary | ICD-10-CM | POA: Diagnosis not present

## 2020-04-28 DIAGNOSIS — E785 Hyperlipidemia, unspecified: Secondary | ICD-10-CM | POA: Diagnosis not present

## 2020-04-28 DIAGNOSIS — Z7401 Bed confinement status: Secondary | ICD-10-CM | POA: Diagnosis not present

## 2020-04-28 DIAGNOSIS — Z741 Need for assistance with personal care: Secondary | ICD-10-CM | POA: Diagnosis not present

## 2020-04-28 DIAGNOSIS — R159 Full incontinence of feces: Secondary | ICD-10-CM | POA: Diagnosis not present

## 2020-04-28 DIAGNOSIS — E1122 Type 2 diabetes mellitus with diabetic chronic kidney disease: Secondary | ICD-10-CM | POA: Diagnosis not present

## 2020-04-28 DIAGNOSIS — I130 Hypertensive heart and chronic kidney disease with heart failure and stage 1 through stage 4 chronic kidney disease, or unspecified chronic kidney disease: Secondary | ICD-10-CM | POA: Diagnosis not present

## 2020-04-28 DIAGNOSIS — N183 Chronic kidney disease, stage 3 unspecified: Secondary | ICD-10-CM | POA: Diagnosis not present

## 2020-04-28 DIAGNOSIS — B449 Aspergillosis, unspecified: Secondary | ICD-10-CM | POA: Diagnosis not present

## 2020-04-28 DIAGNOSIS — E1142 Type 2 diabetes mellitus with diabetic polyneuropathy: Secondary | ICD-10-CM | POA: Diagnosis not present

## 2020-04-28 DIAGNOSIS — F418 Other specified anxiety disorders: Secondary | ICD-10-CM | POA: Diagnosis not present

## 2020-04-28 DIAGNOSIS — I5042 Chronic combined systolic (congestive) and diastolic (congestive) heart failure: Secondary | ICD-10-CM | POA: Diagnosis not present

## 2020-04-28 DIAGNOSIS — R32 Unspecified urinary incontinence: Secondary | ICD-10-CM | POA: Diagnosis not present

## 2020-04-28 DIAGNOSIS — F840 Autistic disorder: Secondary | ICD-10-CM | POA: Diagnosis not present

## 2020-04-28 DIAGNOSIS — K219 Gastro-esophageal reflux disease without esophagitis: Secondary | ICD-10-CM | POA: Diagnosis not present

## 2020-04-28 DIAGNOSIS — Z993 Dependence on wheelchair: Secondary | ICD-10-CM | POA: Diagnosis not present

## 2020-04-28 DIAGNOSIS — Z8673 Personal history of transient ischemic attack (TIA), and cerebral infarction without residual deficits: Secondary | ICD-10-CM | POA: Diagnosis not present

## 2020-04-28 DIAGNOSIS — D631 Anemia in chronic kidney disease: Secondary | ICD-10-CM | POA: Diagnosis not present

## 2020-04-28 DIAGNOSIS — M8580 Other specified disorders of bone density and structure, unspecified site: Secondary | ICD-10-CM | POA: Diagnosis not present

## 2020-04-28 DIAGNOSIS — Z6821 Body mass index (BMI) 21.0-21.9, adult: Secondary | ICD-10-CM | POA: Diagnosis not present

## 2020-04-28 DIAGNOSIS — Z8571 Personal history of Hodgkin lymphoma: Secondary | ICD-10-CM | POA: Diagnosis not present

## 2020-04-28 DIAGNOSIS — N3281 Overactive bladder: Secondary | ICD-10-CM | POA: Diagnosis not present

## 2020-04-28 DIAGNOSIS — W19XXXD Unspecified fall, subsequent encounter: Secondary | ICD-10-CM | POA: Diagnosis not present

## 2020-04-29 DIAGNOSIS — N183 Chronic kidney disease, stage 3 unspecified: Secondary | ICD-10-CM | POA: Diagnosis not present

## 2020-04-29 DIAGNOSIS — D631 Anemia in chronic kidney disease: Secondary | ICD-10-CM | POA: Diagnosis not present

## 2020-04-29 DIAGNOSIS — E1122 Type 2 diabetes mellitus with diabetic chronic kidney disease: Secondary | ICD-10-CM | POA: Diagnosis not present

## 2020-04-29 DIAGNOSIS — I130 Hypertensive heart and chronic kidney disease with heart failure and stage 1 through stage 4 chronic kidney disease, or unspecified chronic kidney disease: Secondary | ICD-10-CM | POA: Diagnosis not present

## 2020-04-29 DIAGNOSIS — I5042 Chronic combined systolic (congestive) and diastolic (congestive) heart failure: Secondary | ICD-10-CM | POA: Diagnosis not present

## 2020-04-29 DIAGNOSIS — E1142 Type 2 diabetes mellitus with diabetic polyneuropathy: Secondary | ICD-10-CM | POA: Diagnosis not present

## 2020-04-30 DIAGNOSIS — I130 Hypertensive heart and chronic kidney disease with heart failure and stage 1 through stage 4 chronic kidney disease, or unspecified chronic kidney disease: Secondary | ICD-10-CM | POA: Diagnosis not present

## 2020-04-30 DIAGNOSIS — E1142 Type 2 diabetes mellitus with diabetic polyneuropathy: Secondary | ICD-10-CM | POA: Diagnosis not present

## 2020-04-30 DIAGNOSIS — D631 Anemia in chronic kidney disease: Secondary | ICD-10-CM | POA: Diagnosis not present

## 2020-04-30 DIAGNOSIS — E1122 Type 2 diabetes mellitus with diabetic chronic kidney disease: Secondary | ICD-10-CM | POA: Diagnosis not present

## 2020-04-30 DIAGNOSIS — N183 Chronic kidney disease, stage 3 unspecified: Secondary | ICD-10-CM | POA: Diagnosis not present

## 2020-04-30 DIAGNOSIS — I5042 Chronic combined systolic (congestive) and diastolic (congestive) heart failure: Secondary | ICD-10-CM | POA: Diagnosis not present

## 2020-05-07 DIAGNOSIS — E1122 Type 2 diabetes mellitus with diabetic chronic kidney disease: Secondary | ICD-10-CM | POA: Diagnosis not present

## 2020-05-07 DIAGNOSIS — N183 Chronic kidney disease, stage 3 unspecified: Secondary | ICD-10-CM | POA: Diagnosis not present

## 2020-05-07 DIAGNOSIS — I130 Hypertensive heart and chronic kidney disease with heart failure and stage 1 through stage 4 chronic kidney disease, or unspecified chronic kidney disease: Secondary | ICD-10-CM | POA: Diagnosis not present

## 2020-05-07 DIAGNOSIS — E1142 Type 2 diabetes mellitus with diabetic polyneuropathy: Secondary | ICD-10-CM | POA: Diagnosis not present

## 2020-05-07 DIAGNOSIS — D631 Anemia in chronic kidney disease: Secondary | ICD-10-CM | POA: Diagnosis not present

## 2020-05-07 DIAGNOSIS — I5042 Chronic combined systolic (congestive) and diastolic (congestive) heart failure: Secondary | ICD-10-CM | POA: Diagnosis not present

## 2020-05-14 DIAGNOSIS — I130 Hypertensive heart and chronic kidney disease with heart failure and stage 1 through stage 4 chronic kidney disease, or unspecified chronic kidney disease: Secondary | ICD-10-CM | POA: Diagnosis not present

## 2020-05-14 DIAGNOSIS — D631 Anemia in chronic kidney disease: Secondary | ICD-10-CM | POA: Diagnosis not present

## 2020-05-14 DIAGNOSIS — E1122 Type 2 diabetes mellitus with diabetic chronic kidney disease: Secondary | ICD-10-CM | POA: Diagnosis not present

## 2020-05-14 DIAGNOSIS — E1142 Type 2 diabetes mellitus with diabetic polyneuropathy: Secondary | ICD-10-CM | POA: Diagnosis not present

## 2020-05-14 DIAGNOSIS — N183 Chronic kidney disease, stage 3 unspecified: Secondary | ICD-10-CM | POA: Diagnosis not present

## 2020-05-14 DIAGNOSIS — I5042 Chronic combined systolic (congestive) and diastolic (congestive) heart failure: Secondary | ICD-10-CM | POA: Diagnosis not present

## 2020-05-15 DIAGNOSIS — E1142 Type 2 diabetes mellitus with diabetic polyneuropathy: Secondary | ICD-10-CM | POA: Diagnosis not present

## 2020-05-15 DIAGNOSIS — D631 Anemia in chronic kidney disease: Secondary | ICD-10-CM | POA: Diagnosis not present

## 2020-05-15 DIAGNOSIS — I130 Hypertensive heart and chronic kidney disease with heart failure and stage 1 through stage 4 chronic kidney disease, or unspecified chronic kidney disease: Secondary | ICD-10-CM | POA: Diagnosis not present

## 2020-05-15 DIAGNOSIS — I5042 Chronic combined systolic (congestive) and diastolic (congestive) heart failure: Secondary | ICD-10-CM | POA: Diagnosis not present

## 2020-05-15 DIAGNOSIS — E1122 Type 2 diabetes mellitus with diabetic chronic kidney disease: Secondary | ICD-10-CM | POA: Diagnosis not present

## 2020-05-15 DIAGNOSIS — N183 Chronic kidney disease, stage 3 unspecified: Secondary | ICD-10-CM | POA: Diagnosis not present

## 2020-05-18 DIAGNOSIS — Z20828 Contact with and (suspected) exposure to other viral communicable diseases: Secondary | ICD-10-CM | POA: Diagnosis not present

## 2020-05-19 DIAGNOSIS — I5042 Chronic combined systolic (congestive) and diastolic (congestive) heart failure: Secondary | ICD-10-CM | POA: Diagnosis not present

## 2020-05-19 DIAGNOSIS — I130 Hypertensive heart and chronic kidney disease with heart failure and stage 1 through stage 4 chronic kidney disease, or unspecified chronic kidney disease: Secondary | ICD-10-CM | POA: Diagnosis not present

## 2020-05-19 DIAGNOSIS — E1142 Type 2 diabetes mellitus with diabetic polyneuropathy: Secondary | ICD-10-CM | POA: Diagnosis not present

## 2020-05-19 DIAGNOSIS — D631 Anemia in chronic kidney disease: Secondary | ICD-10-CM | POA: Diagnosis not present

## 2020-05-19 DIAGNOSIS — E1122 Type 2 diabetes mellitus with diabetic chronic kidney disease: Secondary | ICD-10-CM | POA: Diagnosis not present

## 2020-05-19 DIAGNOSIS — N183 Chronic kidney disease, stage 3 unspecified: Secondary | ICD-10-CM | POA: Diagnosis not present

## 2020-05-26 DIAGNOSIS — I5042 Chronic combined systolic (congestive) and diastolic (congestive) heart failure: Secondary | ICD-10-CM | POA: Diagnosis not present

## 2020-05-26 DIAGNOSIS — R159 Full incontinence of feces: Secondary | ICD-10-CM | POA: Diagnosis not present

## 2020-05-26 DIAGNOSIS — E785 Hyperlipidemia, unspecified: Secondary | ICD-10-CM | POA: Diagnosis not present

## 2020-05-26 DIAGNOSIS — I130 Hypertensive heart and chronic kidney disease with heart failure and stage 1 through stage 4 chronic kidney disease, or unspecified chronic kidney disease: Secondary | ICD-10-CM | POA: Diagnosis not present

## 2020-05-26 DIAGNOSIS — M8580 Other specified disorders of bone density and structure, unspecified site: Secondary | ICD-10-CM | POA: Diagnosis not present

## 2020-05-26 DIAGNOSIS — Z8673 Personal history of transient ischemic attack (TIA), and cerebral infarction without residual deficits: Secondary | ICD-10-CM | POA: Diagnosis not present

## 2020-05-26 DIAGNOSIS — Z7401 Bed confinement status: Secondary | ICD-10-CM | POA: Diagnosis not present

## 2020-05-26 DIAGNOSIS — R32 Unspecified urinary incontinence: Secondary | ICD-10-CM | POA: Diagnosis not present

## 2020-05-26 DIAGNOSIS — N183 Chronic kidney disease, stage 3 unspecified: Secondary | ICD-10-CM | POA: Diagnosis not present

## 2020-05-26 DIAGNOSIS — Z6821 Body mass index (BMI) 21.0-21.9, adult: Secondary | ICD-10-CM | POA: Diagnosis not present

## 2020-05-26 DIAGNOSIS — F418 Other specified anxiety disorders: Secondary | ICD-10-CM | POA: Diagnosis not present

## 2020-05-26 DIAGNOSIS — F840 Autistic disorder: Secondary | ICD-10-CM | POA: Diagnosis not present

## 2020-05-26 DIAGNOSIS — B449 Aspergillosis, unspecified: Secondary | ICD-10-CM | POA: Diagnosis not present

## 2020-05-26 DIAGNOSIS — N3281 Overactive bladder: Secondary | ICD-10-CM | POA: Diagnosis not present

## 2020-05-26 DIAGNOSIS — Z8571 Personal history of Hodgkin lymphoma: Secondary | ICD-10-CM | POA: Diagnosis not present

## 2020-05-26 DIAGNOSIS — D631 Anemia in chronic kidney disease: Secondary | ICD-10-CM | POA: Diagnosis not present

## 2020-05-26 DIAGNOSIS — Z741 Need for assistance with personal care: Secondary | ICD-10-CM | POA: Diagnosis not present

## 2020-05-26 DIAGNOSIS — E1142 Type 2 diabetes mellitus with diabetic polyneuropathy: Secondary | ICD-10-CM | POA: Diagnosis not present

## 2020-05-26 DIAGNOSIS — W19XXXD Unspecified fall, subsequent encounter: Secondary | ICD-10-CM | POA: Diagnosis not present

## 2020-05-26 DIAGNOSIS — Z993 Dependence on wheelchair: Secondary | ICD-10-CM | POA: Diagnosis not present

## 2020-05-26 DIAGNOSIS — E1122 Type 2 diabetes mellitus with diabetic chronic kidney disease: Secondary | ICD-10-CM | POA: Diagnosis not present

## 2020-05-26 DIAGNOSIS — K219 Gastro-esophageal reflux disease without esophagitis: Secondary | ICD-10-CM | POA: Diagnosis not present

## 2020-05-28 DIAGNOSIS — I5042 Chronic combined systolic (congestive) and diastolic (congestive) heart failure: Secondary | ICD-10-CM | POA: Diagnosis not present

## 2020-05-28 DIAGNOSIS — I130 Hypertensive heart and chronic kidney disease with heart failure and stage 1 through stage 4 chronic kidney disease, or unspecified chronic kidney disease: Secondary | ICD-10-CM | POA: Diagnosis not present

## 2020-05-28 DIAGNOSIS — E1142 Type 2 diabetes mellitus with diabetic polyneuropathy: Secondary | ICD-10-CM | POA: Diagnosis not present

## 2020-05-28 DIAGNOSIS — N183 Chronic kidney disease, stage 3 unspecified: Secondary | ICD-10-CM | POA: Diagnosis not present

## 2020-05-28 DIAGNOSIS — D631 Anemia in chronic kidney disease: Secondary | ICD-10-CM | POA: Diagnosis not present

## 2020-05-28 DIAGNOSIS — E1122 Type 2 diabetes mellitus with diabetic chronic kidney disease: Secondary | ICD-10-CM | POA: Diagnosis not present

## 2020-06-01 DIAGNOSIS — D631 Anemia in chronic kidney disease: Secondary | ICD-10-CM | POA: Diagnosis not present

## 2020-06-01 DIAGNOSIS — I5042 Chronic combined systolic (congestive) and diastolic (congestive) heart failure: Secondary | ICD-10-CM | POA: Diagnosis not present

## 2020-06-01 DIAGNOSIS — I130 Hypertensive heart and chronic kidney disease with heart failure and stage 1 through stage 4 chronic kidney disease, or unspecified chronic kidney disease: Secondary | ICD-10-CM | POA: Diagnosis not present

## 2020-06-01 DIAGNOSIS — N183 Chronic kidney disease, stage 3 unspecified: Secondary | ICD-10-CM | POA: Diagnosis not present

## 2020-06-01 DIAGNOSIS — E1142 Type 2 diabetes mellitus with diabetic polyneuropathy: Secondary | ICD-10-CM | POA: Diagnosis not present

## 2020-06-01 DIAGNOSIS — E1122 Type 2 diabetes mellitus with diabetic chronic kidney disease: Secondary | ICD-10-CM | POA: Diagnosis not present

## 2020-06-04 DIAGNOSIS — I130 Hypertensive heart and chronic kidney disease with heart failure and stage 1 through stage 4 chronic kidney disease, or unspecified chronic kidney disease: Secondary | ICD-10-CM | POA: Diagnosis not present

## 2020-06-04 DIAGNOSIS — N183 Chronic kidney disease, stage 3 unspecified: Secondary | ICD-10-CM | POA: Diagnosis not present

## 2020-06-04 DIAGNOSIS — E1142 Type 2 diabetes mellitus with diabetic polyneuropathy: Secondary | ICD-10-CM | POA: Diagnosis not present

## 2020-06-04 DIAGNOSIS — E1122 Type 2 diabetes mellitus with diabetic chronic kidney disease: Secondary | ICD-10-CM | POA: Diagnosis not present

## 2020-06-04 DIAGNOSIS — D631 Anemia in chronic kidney disease: Secondary | ICD-10-CM | POA: Diagnosis not present

## 2020-06-04 DIAGNOSIS — I5042 Chronic combined systolic (congestive) and diastolic (congestive) heart failure: Secondary | ICD-10-CM | POA: Diagnosis not present

## 2020-06-05 DIAGNOSIS — I5042 Chronic combined systolic (congestive) and diastolic (congestive) heart failure: Secondary | ICD-10-CM | POA: Diagnosis not present

## 2020-06-05 DIAGNOSIS — E1142 Type 2 diabetes mellitus with diabetic polyneuropathy: Secondary | ICD-10-CM | POA: Diagnosis not present

## 2020-06-05 DIAGNOSIS — E1122 Type 2 diabetes mellitus with diabetic chronic kidney disease: Secondary | ICD-10-CM | POA: Diagnosis not present

## 2020-06-05 DIAGNOSIS — I130 Hypertensive heart and chronic kidney disease with heart failure and stage 1 through stage 4 chronic kidney disease, or unspecified chronic kidney disease: Secondary | ICD-10-CM | POA: Diagnosis not present

## 2020-06-05 DIAGNOSIS — D631 Anemia in chronic kidney disease: Secondary | ICD-10-CM | POA: Diagnosis not present

## 2020-06-05 DIAGNOSIS — N183 Chronic kidney disease, stage 3 unspecified: Secondary | ICD-10-CM | POA: Diagnosis not present

## 2020-06-09 DIAGNOSIS — D631 Anemia in chronic kidney disease: Secondary | ICD-10-CM | POA: Diagnosis not present

## 2020-06-09 DIAGNOSIS — I5042 Chronic combined systolic (congestive) and diastolic (congestive) heart failure: Secondary | ICD-10-CM | POA: Diagnosis not present

## 2020-06-09 DIAGNOSIS — E1122 Type 2 diabetes mellitus with diabetic chronic kidney disease: Secondary | ICD-10-CM | POA: Diagnosis not present

## 2020-06-09 DIAGNOSIS — I130 Hypertensive heart and chronic kidney disease with heart failure and stage 1 through stage 4 chronic kidney disease, or unspecified chronic kidney disease: Secondary | ICD-10-CM | POA: Diagnosis not present

## 2020-06-09 DIAGNOSIS — N183 Chronic kidney disease, stage 3 unspecified: Secondary | ICD-10-CM | POA: Diagnosis not present

## 2020-06-09 DIAGNOSIS — E1142 Type 2 diabetes mellitus with diabetic polyneuropathy: Secondary | ICD-10-CM | POA: Diagnosis not present

## 2020-06-10 DIAGNOSIS — I5032 Chronic diastolic (congestive) heart failure: Secondary | ICD-10-CM | POA: Diagnosis not present

## 2020-06-10 DIAGNOSIS — N1831 Chronic kidney disease, stage 3a: Secondary | ICD-10-CM | POA: Diagnosis not present

## 2020-06-10 DIAGNOSIS — E1122 Type 2 diabetes mellitus with diabetic chronic kidney disease: Secondary | ICD-10-CM | POA: Diagnosis not present

## 2020-06-10 DIAGNOSIS — I130 Hypertensive heart and chronic kidney disease with heart failure and stage 1 through stage 4 chronic kidney disease, or unspecified chronic kidney disease: Secondary | ICD-10-CM | POA: Diagnosis not present

## 2020-06-10 DIAGNOSIS — E1142 Type 2 diabetes mellitus with diabetic polyneuropathy: Secondary | ICD-10-CM | POA: Diagnosis not present

## 2020-06-12 DIAGNOSIS — Z20828 Contact with and (suspected) exposure to other viral communicable diseases: Secondary | ICD-10-CM | POA: Diagnosis not present

## 2020-06-17 DIAGNOSIS — I5042 Chronic combined systolic (congestive) and diastolic (congestive) heart failure: Secondary | ICD-10-CM | POA: Diagnosis not present

## 2020-06-17 DIAGNOSIS — E1122 Type 2 diabetes mellitus with diabetic chronic kidney disease: Secondary | ICD-10-CM | POA: Diagnosis not present

## 2020-06-17 DIAGNOSIS — I130 Hypertensive heart and chronic kidney disease with heart failure and stage 1 through stage 4 chronic kidney disease, or unspecified chronic kidney disease: Secondary | ICD-10-CM | POA: Diagnosis not present

## 2020-06-17 DIAGNOSIS — E1142 Type 2 diabetes mellitus with diabetic polyneuropathy: Secondary | ICD-10-CM | POA: Diagnosis not present

## 2020-06-17 DIAGNOSIS — D631 Anemia in chronic kidney disease: Secondary | ICD-10-CM | POA: Diagnosis not present

## 2020-06-17 DIAGNOSIS — N183 Chronic kidney disease, stage 3 unspecified: Secondary | ICD-10-CM | POA: Diagnosis not present

## 2020-06-19 DIAGNOSIS — E785 Hyperlipidemia, unspecified: Secondary | ICD-10-CM | POA: Diagnosis not present

## 2020-06-19 DIAGNOSIS — I130 Hypertensive heart and chronic kidney disease with heart failure and stage 1 through stage 4 chronic kidney disease, or unspecified chronic kidney disease: Secondary | ICD-10-CM | POA: Diagnosis not present

## 2020-06-19 DIAGNOSIS — E1122 Type 2 diabetes mellitus with diabetic chronic kidney disease: Secondary | ICD-10-CM | POA: Diagnosis not present

## 2020-06-19 DIAGNOSIS — N1831 Chronic kidney disease, stage 3a: Secondary | ICD-10-CM | POA: Diagnosis not present

## 2020-06-19 DIAGNOSIS — I129 Hypertensive chronic kidney disease with stage 1 through stage 4 chronic kidney disease, or unspecified chronic kidney disease: Secondary | ICD-10-CM | POA: Diagnosis not present

## 2020-06-22 DIAGNOSIS — N183 Chronic kidney disease, stage 3 unspecified: Secondary | ICD-10-CM | POA: Diagnosis not present

## 2020-06-22 DIAGNOSIS — E1142 Type 2 diabetes mellitus with diabetic polyneuropathy: Secondary | ICD-10-CM | POA: Diagnosis not present

## 2020-06-22 DIAGNOSIS — D631 Anemia in chronic kidney disease: Secondary | ICD-10-CM | POA: Diagnosis not present

## 2020-06-22 DIAGNOSIS — I5042 Chronic combined systolic (congestive) and diastolic (congestive) heart failure: Secondary | ICD-10-CM | POA: Diagnosis not present

## 2020-06-22 DIAGNOSIS — E1122 Type 2 diabetes mellitus with diabetic chronic kidney disease: Secondary | ICD-10-CM | POA: Diagnosis not present

## 2020-06-22 DIAGNOSIS — I130 Hypertensive heart and chronic kidney disease with heart failure and stage 1 through stage 4 chronic kidney disease, or unspecified chronic kidney disease: Secondary | ICD-10-CM | POA: Diagnosis not present

## 2020-08-04 DIAGNOSIS — Z20828 Contact with and (suspected) exposure to other viral communicable diseases: Secondary | ICD-10-CM | POA: Diagnosis not present

## 2020-09-14 DIAGNOSIS — I5022 Chronic systolic (congestive) heart failure: Secondary | ICD-10-CM | POA: Diagnosis not present

## 2020-09-14 DIAGNOSIS — R41841 Cognitive communication deficit: Secondary | ICD-10-CM | POA: Diagnosis not present

## 2020-09-14 DIAGNOSIS — M6281 Muscle weakness (generalized): Secondary | ICD-10-CM | POA: Diagnosis not present

## 2020-09-14 DIAGNOSIS — R55 Syncope and collapse: Secondary | ICD-10-CM | POA: Diagnosis not present

## 2020-09-14 DIAGNOSIS — R2681 Unsteadiness on feet: Secondary | ICD-10-CM | POA: Diagnosis not present

## 2020-09-14 DIAGNOSIS — R2689 Other abnormalities of gait and mobility: Secondary | ICD-10-CM | POA: Diagnosis not present

## 2020-09-15 DIAGNOSIS — R2689 Other abnormalities of gait and mobility: Secondary | ICD-10-CM | POA: Diagnosis not present

## 2020-09-15 DIAGNOSIS — I5022 Chronic systolic (congestive) heart failure: Secondary | ICD-10-CM | POA: Diagnosis not present

## 2020-09-15 DIAGNOSIS — R55 Syncope and collapse: Secondary | ICD-10-CM | POA: Diagnosis not present

## 2020-09-15 DIAGNOSIS — R2681 Unsteadiness on feet: Secondary | ICD-10-CM | POA: Diagnosis not present

## 2020-09-15 DIAGNOSIS — M6281 Muscle weakness (generalized): Secondary | ICD-10-CM | POA: Diagnosis not present

## 2020-09-15 DIAGNOSIS — R41841 Cognitive communication deficit: Secondary | ICD-10-CM | POA: Diagnosis not present

## 2020-09-16 DIAGNOSIS — R41841 Cognitive communication deficit: Secondary | ICD-10-CM | POA: Diagnosis not present

## 2020-09-16 DIAGNOSIS — M6281 Muscle weakness (generalized): Secondary | ICD-10-CM | POA: Diagnosis not present

## 2020-09-16 DIAGNOSIS — R55 Syncope and collapse: Secondary | ICD-10-CM | POA: Diagnosis not present

## 2020-09-16 DIAGNOSIS — R2689 Other abnormalities of gait and mobility: Secondary | ICD-10-CM | POA: Diagnosis not present

## 2020-09-16 DIAGNOSIS — I5022 Chronic systolic (congestive) heart failure: Secondary | ICD-10-CM | POA: Diagnosis not present

## 2020-09-16 DIAGNOSIS — R2681 Unsteadiness on feet: Secondary | ICD-10-CM | POA: Diagnosis not present

## 2020-09-17 DIAGNOSIS — R2689 Other abnormalities of gait and mobility: Secondary | ICD-10-CM | POA: Diagnosis not present

## 2020-09-17 DIAGNOSIS — I5022 Chronic systolic (congestive) heart failure: Secondary | ICD-10-CM | POA: Diagnosis not present

## 2020-09-17 DIAGNOSIS — M6281 Muscle weakness (generalized): Secondary | ICD-10-CM | POA: Diagnosis not present

## 2020-09-17 DIAGNOSIS — R2681 Unsteadiness on feet: Secondary | ICD-10-CM | POA: Diagnosis not present

## 2020-09-17 DIAGNOSIS — R55 Syncope and collapse: Secondary | ICD-10-CM | POA: Diagnosis not present

## 2020-09-17 DIAGNOSIS — R41841 Cognitive communication deficit: Secondary | ICD-10-CM | POA: Diagnosis not present

## 2020-09-18 DIAGNOSIS — I5022 Chronic systolic (congestive) heart failure: Secondary | ICD-10-CM | POA: Diagnosis not present

## 2020-09-18 DIAGNOSIS — M6281 Muscle weakness (generalized): Secondary | ICD-10-CM | POA: Diagnosis not present

## 2020-09-18 DIAGNOSIS — R55 Syncope and collapse: Secondary | ICD-10-CM | POA: Diagnosis not present

## 2020-09-18 DIAGNOSIS — R2689 Other abnormalities of gait and mobility: Secondary | ICD-10-CM | POA: Diagnosis not present

## 2020-09-18 DIAGNOSIS — R41841 Cognitive communication deficit: Secondary | ICD-10-CM | POA: Diagnosis not present

## 2020-09-18 DIAGNOSIS — R2681 Unsteadiness on feet: Secondary | ICD-10-CM | POA: Diagnosis not present

## 2020-09-21 DIAGNOSIS — R41841 Cognitive communication deficit: Secondary | ICD-10-CM | POA: Diagnosis not present

## 2020-09-21 DIAGNOSIS — R2689 Other abnormalities of gait and mobility: Secondary | ICD-10-CM | POA: Diagnosis not present

## 2020-09-21 DIAGNOSIS — I5022 Chronic systolic (congestive) heart failure: Secondary | ICD-10-CM | POA: Diagnosis not present

## 2020-09-21 DIAGNOSIS — R2681 Unsteadiness on feet: Secondary | ICD-10-CM | POA: Diagnosis not present

## 2020-09-21 DIAGNOSIS — R55 Syncope and collapse: Secondary | ICD-10-CM | POA: Diagnosis not present

## 2020-09-21 DIAGNOSIS — M6281 Muscle weakness (generalized): Secondary | ICD-10-CM | POA: Diagnosis not present

## 2020-09-22 DIAGNOSIS — R55 Syncope and collapse: Secondary | ICD-10-CM | POA: Diagnosis not present

## 2020-09-22 DIAGNOSIS — R2681 Unsteadiness on feet: Secondary | ICD-10-CM | POA: Diagnosis not present

## 2020-09-22 DIAGNOSIS — I5022 Chronic systolic (congestive) heart failure: Secondary | ICD-10-CM | POA: Diagnosis not present

## 2020-09-22 DIAGNOSIS — R41841 Cognitive communication deficit: Secondary | ICD-10-CM | POA: Diagnosis not present

## 2020-09-22 DIAGNOSIS — M6281 Muscle weakness (generalized): Secondary | ICD-10-CM | POA: Diagnosis not present

## 2020-09-22 DIAGNOSIS — R2689 Other abnormalities of gait and mobility: Secondary | ICD-10-CM | POA: Diagnosis not present

## 2020-09-24 DIAGNOSIS — R2681 Unsteadiness on feet: Secondary | ICD-10-CM | POA: Diagnosis not present

## 2020-09-24 DIAGNOSIS — R55 Syncope and collapse: Secondary | ICD-10-CM | POA: Diagnosis not present

## 2020-09-24 DIAGNOSIS — R41841 Cognitive communication deficit: Secondary | ICD-10-CM | POA: Diagnosis not present

## 2020-09-24 DIAGNOSIS — R2689 Other abnormalities of gait and mobility: Secondary | ICD-10-CM | POA: Diagnosis not present

## 2020-09-24 DIAGNOSIS — I5022 Chronic systolic (congestive) heart failure: Secondary | ICD-10-CM | POA: Diagnosis not present

## 2020-09-24 DIAGNOSIS — M6281 Muscle weakness (generalized): Secondary | ICD-10-CM | POA: Diagnosis not present

## 2020-09-25 DIAGNOSIS — R2681 Unsteadiness on feet: Secondary | ICD-10-CM | POA: Diagnosis not present

## 2020-09-25 DIAGNOSIS — R2689 Other abnormalities of gait and mobility: Secondary | ICD-10-CM | POA: Diagnosis not present

## 2020-09-25 DIAGNOSIS — M6281 Muscle weakness (generalized): Secondary | ICD-10-CM | POA: Diagnosis not present

## 2020-09-29 DIAGNOSIS — M6281 Muscle weakness (generalized): Secondary | ICD-10-CM | POA: Diagnosis not present

## 2020-09-29 DIAGNOSIS — R2689 Other abnormalities of gait and mobility: Secondary | ICD-10-CM | POA: Diagnosis not present

## 2020-09-29 DIAGNOSIS — R2681 Unsteadiness on feet: Secondary | ICD-10-CM | POA: Diagnosis not present

## 2020-10-01 DIAGNOSIS — R5383 Other fatigue: Secondary | ICD-10-CM | POA: Diagnosis not present

## 2020-10-01 DIAGNOSIS — M6281 Muscle weakness (generalized): Secondary | ICD-10-CM | POA: Diagnosis not present

## 2020-10-01 DIAGNOSIS — E785 Hyperlipidemia, unspecified: Secondary | ICD-10-CM | POA: Diagnosis not present

## 2020-10-01 DIAGNOSIS — E1122 Type 2 diabetes mellitus with diabetic chronic kidney disease: Secondary | ICD-10-CM | POA: Diagnosis not present

## 2020-10-01 DIAGNOSIS — D649 Anemia, unspecified: Secondary | ICD-10-CM | POA: Diagnosis not present

## 2020-10-01 DIAGNOSIS — R2681 Unsteadiness on feet: Secondary | ICD-10-CM | POA: Diagnosis not present

## 2020-10-01 DIAGNOSIS — R2689 Other abnormalities of gait and mobility: Secondary | ICD-10-CM | POA: Diagnosis not present

## 2020-10-01 DIAGNOSIS — I129 Hypertensive chronic kidney disease with stage 1 through stage 4 chronic kidney disease, or unspecified chronic kidney disease: Secondary | ICD-10-CM | POA: Diagnosis not present

## 2020-10-02 DIAGNOSIS — R2681 Unsteadiness on feet: Secondary | ICD-10-CM | POA: Diagnosis not present

## 2020-10-02 DIAGNOSIS — R2689 Other abnormalities of gait and mobility: Secondary | ICD-10-CM | POA: Diagnosis not present

## 2020-10-02 DIAGNOSIS — M6281 Muscle weakness (generalized): Secondary | ICD-10-CM | POA: Diagnosis not present

## 2020-10-03 DIAGNOSIS — R2681 Unsteadiness on feet: Secondary | ICD-10-CM | POA: Diagnosis not present

## 2020-10-03 DIAGNOSIS — M6281 Muscle weakness (generalized): Secondary | ICD-10-CM | POA: Diagnosis not present

## 2020-10-03 DIAGNOSIS — R2689 Other abnormalities of gait and mobility: Secondary | ICD-10-CM | POA: Diagnosis not present

## 2020-10-05 DIAGNOSIS — M6281 Muscle weakness (generalized): Secondary | ICD-10-CM | POA: Diagnosis not present

## 2020-10-05 DIAGNOSIS — R2689 Other abnormalities of gait and mobility: Secondary | ICD-10-CM | POA: Diagnosis not present

## 2020-10-05 DIAGNOSIS — R2681 Unsteadiness on feet: Secondary | ICD-10-CM | POA: Diagnosis not present

## 2020-10-06 DIAGNOSIS — R2681 Unsteadiness on feet: Secondary | ICD-10-CM | POA: Diagnosis not present

## 2020-10-06 DIAGNOSIS — M6281 Muscle weakness (generalized): Secondary | ICD-10-CM | POA: Diagnosis not present

## 2020-10-06 DIAGNOSIS — R2689 Other abnormalities of gait and mobility: Secondary | ICD-10-CM | POA: Diagnosis not present

## 2020-10-08 DIAGNOSIS — R2689 Other abnormalities of gait and mobility: Secondary | ICD-10-CM | POA: Diagnosis not present

## 2020-10-08 DIAGNOSIS — M6281 Muscle weakness (generalized): Secondary | ICD-10-CM | POA: Diagnosis not present

## 2020-10-08 DIAGNOSIS — R2681 Unsteadiness on feet: Secondary | ICD-10-CM | POA: Diagnosis not present

## 2020-10-12 DIAGNOSIS — R2689 Other abnormalities of gait and mobility: Secondary | ICD-10-CM | POA: Diagnosis not present

## 2020-10-12 DIAGNOSIS — M6281 Muscle weakness (generalized): Secondary | ICD-10-CM | POA: Diagnosis not present

## 2020-10-12 DIAGNOSIS — R2681 Unsteadiness on feet: Secondary | ICD-10-CM | POA: Diagnosis not present

## 2020-10-13 DIAGNOSIS — R2681 Unsteadiness on feet: Secondary | ICD-10-CM | POA: Diagnosis not present

## 2020-10-13 DIAGNOSIS — M6281 Muscle weakness (generalized): Secondary | ICD-10-CM | POA: Diagnosis not present

## 2020-10-13 DIAGNOSIS — R2689 Other abnormalities of gait and mobility: Secondary | ICD-10-CM | POA: Diagnosis not present

## 2020-10-15 DIAGNOSIS — R2681 Unsteadiness on feet: Secondary | ICD-10-CM | POA: Diagnosis not present

## 2020-10-15 DIAGNOSIS — M6281 Muscle weakness (generalized): Secondary | ICD-10-CM | POA: Diagnosis not present

## 2020-10-15 DIAGNOSIS — R2689 Other abnormalities of gait and mobility: Secondary | ICD-10-CM | POA: Diagnosis not present

## 2020-10-17 DIAGNOSIS — M6281 Muscle weakness (generalized): Secondary | ICD-10-CM | POA: Diagnosis not present

## 2020-10-17 DIAGNOSIS — R2681 Unsteadiness on feet: Secondary | ICD-10-CM | POA: Diagnosis not present

## 2020-10-17 DIAGNOSIS — R2689 Other abnormalities of gait and mobility: Secondary | ICD-10-CM | POA: Diagnosis not present

## 2020-10-18 DIAGNOSIS — R2689 Other abnormalities of gait and mobility: Secondary | ICD-10-CM | POA: Diagnosis not present

## 2020-10-18 DIAGNOSIS — M6281 Muscle weakness (generalized): Secondary | ICD-10-CM | POA: Diagnosis not present

## 2020-10-18 DIAGNOSIS — R2681 Unsteadiness on feet: Secondary | ICD-10-CM | POA: Diagnosis not present

## 2020-10-20 DIAGNOSIS — R2681 Unsteadiness on feet: Secondary | ICD-10-CM | POA: Diagnosis not present

## 2020-10-20 DIAGNOSIS — R2689 Other abnormalities of gait and mobility: Secondary | ICD-10-CM | POA: Diagnosis not present

## 2020-10-20 DIAGNOSIS — M6281 Muscle weakness (generalized): Secondary | ICD-10-CM | POA: Diagnosis not present

## 2020-10-23 ENCOUNTER — Ambulatory Visit: Payer: Medicare Other | Admitting: Podiatry

## 2020-10-23 DIAGNOSIS — R2681 Unsteadiness on feet: Secondary | ICD-10-CM | POA: Diagnosis not present

## 2020-10-23 DIAGNOSIS — M6281 Muscle weakness (generalized): Secondary | ICD-10-CM | POA: Diagnosis not present

## 2020-10-23 DIAGNOSIS — R2689 Other abnormalities of gait and mobility: Secondary | ICD-10-CM | POA: Diagnosis not present

## 2020-10-26 DIAGNOSIS — R2689 Other abnormalities of gait and mobility: Secondary | ICD-10-CM | POA: Diagnosis not present

## 2020-10-26 DIAGNOSIS — R2681 Unsteadiness on feet: Secondary | ICD-10-CM | POA: Diagnosis not present

## 2020-10-26 DIAGNOSIS — M6281 Muscle weakness (generalized): Secondary | ICD-10-CM | POA: Diagnosis not present

## 2020-10-29 DIAGNOSIS — R2681 Unsteadiness on feet: Secondary | ICD-10-CM | POA: Diagnosis not present

## 2020-10-29 DIAGNOSIS — R2689 Other abnormalities of gait and mobility: Secondary | ICD-10-CM | POA: Diagnosis not present

## 2020-10-29 DIAGNOSIS — M6281 Muscle weakness (generalized): Secondary | ICD-10-CM | POA: Diagnosis not present

## 2020-10-30 DIAGNOSIS — M6281 Muscle weakness (generalized): Secondary | ICD-10-CM | POA: Diagnosis not present

## 2020-10-30 DIAGNOSIS — R2689 Other abnormalities of gait and mobility: Secondary | ICD-10-CM | POA: Diagnosis not present

## 2020-10-30 DIAGNOSIS — R2681 Unsteadiness on feet: Secondary | ICD-10-CM | POA: Diagnosis not present

## 2020-11-04 DIAGNOSIS — R2681 Unsteadiness on feet: Secondary | ICD-10-CM | POA: Diagnosis not present

## 2020-11-04 DIAGNOSIS — M6281 Muscle weakness (generalized): Secondary | ICD-10-CM | POA: Diagnosis not present

## 2020-11-04 DIAGNOSIS — R2689 Other abnormalities of gait and mobility: Secondary | ICD-10-CM | POA: Diagnosis not present

## 2020-11-05 DIAGNOSIS — R2681 Unsteadiness on feet: Secondary | ICD-10-CM | POA: Diagnosis not present

## 2020-11-05 DIAGNOSIS — M6281 Muscle weakness (generalized): Secondary | ICD-10-CM | POA: Diagnosis not present

## 2020-11-05 DIAGNOSIS — R2689 Other abnormalities of gait and mobility: Secondary | ICD-10-CM | POA: Diagnosis not present

## 2020-11-10 DIAGNOSIS — M6281 Muscle weakness (generalized): Secondary | ICD-10-CM | POA: Diagnosis not present

## 2020-11-10 DIAGNOSIS — R2689 Other abnormalities of gait and mobility: Secondary | ICD-10-CM | POA: Diagnosis not present

## 2020-11-10 DIAGNOSIS — R2681 Unsteadiness on feet: Secondary | ICD-10-CM | POA: Diagnosis not present

## 2020-11-11 DIAGNOSIS — R2681 Unsteadiness on feet: Secondary | ICD-10-CM | POA: Diagnosis not present

## 2020-11-11 DIAGNOSIS — M6281 Muscle weakness (generalized): Secondary | ICD-10-CM | POA: Diagnosis not present

## 2020-11-11 DIAGNOSIS — R2689 Other abnormalities of gait and mobility: Secondary | ICD-10-CM | POA: Diagnosis not present

## 2020-11-12 DIAGNOSIS — R2681 Unsteadiness on feet: Secondary | ICD-10-CM | POA: Diagnosis not present

## 2020-11-12 DIAGNOSIS — M6281 Muscle weakness (generalized): Secondary | ICD-10-CM | POA: Diagnosis not present

## 2020-11-12 DIAGNOSIS — R2689 Other abnormalities of gait and mobility: Secondary | ICD-10-CM | POA: Diagnosis not present

## 2020-11-13 DIAGNOSIS — R2689 Other abnormalities of gait and mobility: Secondary | ICD-10-CM | POA: Diagnosis not present

## 2020-11-13 DIAGNOSIS — R2681 Unsteadiness on feet: Secondary | ICD-10-CM | POA: Diagnosis not present

## 2020-11-13 DIAGNOSIS — M6281 Muscle weakness (generalized): Secondary | ICD-10-CM | POA: Diagnosis not present

## 2020-11-16 DIAGNOSIS — M6281 Muscle weakness (generalized): Secondary | ICD-10-CM | POA: Diagnosis not present

## 2020-11-16 DIAGNOSIS — R2681 Unsteadiness on feet: Secondary | ICD-10-CM | POA: Diagnosis not present

## 2020-11-16 DIAGNOSIS — R2689 Other abnormalities of gait and mobility: Secondary | ICD-10-CM | POA: Diagnosis not present

## 2020-11-17 DIAGNOSIS — R2689 Other abnormalities of gait and mobility: Secondary | ICD-10-CM | POA: Diagnosis not present

## 2020-11-17 DIAGNOSIS — R2681 Unsteadiness on feet: Secondary | ICD-10-CM | POA: Diagnosis not present

## 2020-11-17 DIAGNOSIS — M6281 Muscle weakness (generalized): Secondary | ICD-10-CM | POA: Diagnosis not present

## 2020-11-18 DIAGNOSIS — M6281 Muscle weakness (generalized): Secondary | ICD-10-CM | POA: Diagnosis not present

## 2020-11-18 DIAGNOSIS — R2681 Unsteadiness on feet: Secondary | ICD-10-CM | POA: Diagnosis not present

## 2020-11-18 DIAGNOSIS — R2689 Other abnormalities of gait and mobility: Secondary | ICD-10-CM | POA: Diagnosis not present

## 2020-11-19 DIAGNOSIS — R2689 Other abnormalities of gait and mobility: Secondary | ICD-10-CM | POA: Diagnosis not present

## 2020-11-19 DIAGNOSIS — M6281 Muscle weakness (generalized): Secondary | ICD-10-CM | POA: Diagnosis not present

## 2020-11-19 DIAGNOSIS — R2681 Unsteadiness on feet: Secondary | ICD-10-CM | POA: Diagnosis not present

## 2020-11-20 DIAGNOSIS — M6281 Muscle weakness (generalized): Secondary | ICD-10-CM | POA: Diagnosis not present

## 2020-11-20 DIAGNOSIS — R2681 Unsteadiness on feet: Secondary | ICD-10-CM | POA: Diagnosis not present

## 2020-11-20 DIAGNOSIS — R2689 Other abnormalities of gait and mobility: Secondary | ICD-10-CM | POA: Diagnosis not present

## 2020-11-24 DIAGNOSIS — R2689 Other abnormalities of gait and mobility: Secondary | ICD-10-CM | POA: Diagnosis not present

## 2020-11-24 DIAGNOSIS — R2681 Unsteadiness on feet: Secondary | ICD-10-CM | POA: Diagnosis not present

## 2020-11-24 DIAGNOSIS — M6281 Muscle weakness (generalized): Secondary | ICD-10-CM | POA: Diagnosis not present

## 2020-11-25 DIAGNOSIS — M6281 Muscle weakness (generalized): Secondary | ICD-10-CM | POA: Diagnosis not present

## 2020-11-25 DIAGNOSIS — R2681 Unsteadiness on feet: Secondary | ICD-10-CM | POA: Diagnosis not present

## 2020-11-25 DIAGNOSIS — R2689 Other abnormalities of gait and mobility: Secondary | ICD-10-CM | POA: Diagnosis not present

## 2020-11-26 DIAGNOSIS — R2681 Unsteadiness on feet: Secondary | ICD-10-CM | POA: Diagnosis not present

## 2020-11-26 DIAGNOSIS — M6281 Muscle weakness (generalized): Secondary | ICD-10-CM | POA: Diagnosis not present

## 2020-11-26 DIAGNOSIS — R2689 Other abnormalities of gait and mobility: Secondary | ICD-10-CM | POA: Diagnosis not present

## 2020-11-27 ENCOUNTER — Ambulatory Visit: Payer: Medicare Other | Admitting: Podiatry

## 2020-12-02 DIAGNOSIS — R2689 Other abnormalities of gait and mobility: Secondary | ICD-10-CM | POA: Diagnosis not present

## 2020-12-02 DIAGNOSIS — M6281 Muscle weakness (generalized): Secondary | ICD-10-CM | POA: Diagnosis not present

## 2020-12-02 DIAGNOSIS — R2681 Unsteadiness on feet: Secondary | ICD-10-CM | POA: Diagnosis not present

## 2020-12-03 DIAGNOSIS — R2681 Unsteadiness on feet: Secondary | ICD-10-CM | POA: Diagnosis not present

## 2020-12-03 DIAGNOSIS — R2689 Other abnormalities of gait and mobility: Secondary | ICD-10-CM | POA: Diagnosis not present

## 2020-12-03 DIAGNOSIS — M6281 Muscle weakness (generalized): Secondary | ICD-10-CM | POA: Diagnosis not present

## 2020-12-04 DIAGNOSIS — M6281 Muscle weakness (generalized): Secondary | ICD-10-CM | POA: Diagnosis not present

## 2020-12-04 DIAGNOSIS — R2681 Unsteadiness on feet: Secondary | ICD-10-CM | POA: Diagnosis not present

## 2020-12-04 DIAGNOSIS — R2689 Other abnormalities of gait and mobility: Secondary | ICD-10-CM | POA: Diagnosis not present

## 2020-12-07 ENCOUNTER — Ambulatory Visit: Payer: Medicare Other | Admitting: Podiatry

## 2020-12-07 DIAGNOSIS — I130 Hypertensive heart and chronic kidney disease with heart failure and stage 1 through stage 4 chronic kidney disease, or unspecified chronic kidney disease: Secondary | ICD-10-CM | POA: Insufficient documentation

## 2020-12-07 DIAGNOSIS — I129 Hypertensive chronic kidney disease with stage 1 through stage 4 chronic kidney disease, or unspecified chronic kidney disease: Secondary | ICD-10-CM | POA: Insufficient documentation

## 2020-12-07 DIAGNOSIS — R413 Other amnesia: Secondary | ICD-10-CM | POA: Insufficient documentation

## 2020-12-07 DIAGNOSIS — E049 Nontoxic goiter, unspecified: Secondary | ICD-10-CM | POA: Insufficient documentation

## 2020-12-07 DIAGNOSIS — M21969 Unspecified acquired deformity of unspecified lower leg: Secondary | ICD-10-CM | POA: Insufficient documentation

## 2020-12-07 DIAGNOSIS — R001 Bradycardia, unspecified: Secondary | ICD-10-CM | POA: Insufficient documentation

## 2020-12-08 DIAGNOSIS — M6281 Muscle weakness (generalized): Secondary | ICD-10-CM | POA: Diagnosis not present

## 2020-12-08 DIAGNOSIS — R2689 Other abnormalities of gait and mobility: Secondary | ICD-10-CM | POA: Diagnosis not present

## 2020-12-08 DIAGNOSIS — R2681 Unsteadiness on feet: Secondary | ICD-10-CM | POA: Diagnosis not present

## 2020-12-09 ENCOUNTER — Ambulatory Visit: Payer: Medicare Other | Admitting: Podiatry

## 2020-12-09 DIAGNOSIS — R2681 Unsteadiness on feet: Secondary | ICD-10-CM | POA: Diagnosis not present

## 2020-12-09 DIAGNOSIS — R2689 Other abnormalities of gait and mobility: Secondary | ICD-10-CM | POA: Diagnosis not present

## 2020-12-09 DIAGNOSIS — M6281 Muscle weakness (generalized): Secondary | ICD-10-CM | POA: Diagnosis not present

## 2020-12-10 DIAGNOSIS — R2689 Other abnormalities of gait and mobility: Secondary | ICD-10-CM | POA: Diagnosis not present

## 2020-12-10 DIAGNOSIS — M6281 Muscle weakness (generalized): Secondary | ICD-10-CM | POA: Diagnosis not present

## 2020-12-10 DIAGNOSIS — R2681 Unsteadiness on feet: Secondary | ICD-10-CM | POA: Diagnosis not present

## 2020-12-11 DIAGNOSIS — M6281 Muscle weakness (generalized): Secondary | ICD-10-CM | POA: Diagnosis not present

## 2020-12-11 DIAGNOSIS — R2689 Other abnormalities of gait and mobility: Secondary | ICD-10-CM | POA: Diagnosis not present

## 2020-12-11 DIAGNOSIS — R2681 Unsteadiness on feet: Secondary | ICD-10-CM | POA: Diagnosis not present

## 2020-12-15 DIAGNOSIS — R2681 Unsteadiness on feet: Secondary | ICD-10-CM | POA: Diagnosis not present

## 2020-12-15 DIAGNOSIS — M6281 Muscle weakness (generalized): Secondary | ICD-10-CM | POA: Diagnosis not present

## 2020-12-15 DIAGNOSIS — R2689 Other abnormalities of gait and mobility: Secondary | ICD-10-CM | POA: Diagnosis not present

## 2020-12-16 DIAGNOSIS — R2681 Unsteadiness on feet: Secondary | ICD-10-CM | POA: Diagnosis not present

## 2020-12-16 DIAGNOSIS — M6281 Muscle weakness (generalized): Secondary | ICD-10-CM | POA: Diagnosis not present

## 2020-12-16 DIAGNOSIS — R2689 Other abnormalities of gait and mobility: Secondary | ICD-10-CM | POA: Diagnosis not present

## 2020-12-17 DIAGNOSIS — R2681 Unsteadiness on feet: Secondary | ICD-10-CM | POA: Diagnosis not present

## 2020-12-17 DIAGNOSIS — M6281 Muscle weakness (generalized): Secondary | ICD-10-CM | POA: Diagnosis not present

## 2020-12-17 DIAGNOSIS — R2689 Other abnormalities of gait and mobility: Secondary | ICD-10-CM | POA: Diagnosis not present

## 2020-12-18 DIAGNOSIS — R2681 Unsteadiness on feet: Secondary | ICD-10-CM | POA: Diagnosis not present

## 2020-12-18 DIAGNOSIS — M6281 Muscle weakness (generalized): Secondary | ICD-10-CM | POA: Diagnosis not present

## 2020-12-18 DIAGNOSIS — R2689 Other abnormalities of gait and mobility: Secondary | ICD-10-CM | POA: Diagnosis not present

## 2020-12-22 DIAGNOSIS — R2681 Unsteadiness on feet: Secondary | ICD-10-CM | POA: Diagnosis not present

## 2020-12-22 DIAGNOSIS — R2689 Other abnormalities of gait and mobility: Secondary | ICD-10-CM | POA: Diagnosis not present

## 2020-12-22 DIAGNOSIS — M6281 Muscle weakness (generalized): Secondary | ICD-10-CM | POA: Diagnosis not present

## 2020-12-24 DIAGNOSIS — R2689 Other abnormalities of gait and mobility: Secondary | ICD-10-CM | POA: Diagnosis not present

## 2020-12-24 DIAGNOSIS — R2681 Unsteadiness on feet: Secondary | ICD-10-CM | POA: Diagnosis not present

## 2020-12-24 DIAGNOSIS — M6281 Muscle weakness (generalized): Secondary | ICD-10-CM | POA: Diagnosis not present

## 2020-12-29 ENCOUNTER — Ambulatory Visit: Payer: Medicare Other | Admitting: Podiatry

## 2020-12-29 DIAGNOSIS — R2689 Other abnormalities of gait and mobility: Secondary | ICD-10-CM | POA: Diagnosis not present

## 2020-12-29 DIAGNOSIS — R2681 Unsteadiness on feet: Secondary | ICD-10-CM | POA: Diagnosis not present

## 2020-12-29 DIAGNOSIS — M6281 Muscle weakness (generalized): Secondary | ICD-10-CM | POA: Diagnosis not present

## 2020-12-31 DIAGNOSIS — R2681 Unsteadiness on feet: Secondary | ICD-10-CM | POA: Diagnosis not present

## 2020-12-31 DIAGNOSIS — M6281 Muscle weakness (generalized): Secondary | ICD-10-CM | POA: Diagnosis not present

## 2020-12-31 DIAGNOSIS — R2689 Other abnormalities of gait and mobility: Secondary | ICD-10-CM | POA: Diagnosis not present

## 2021-01-01 DIAGNOSIS — R2689 Other abnormalities of gait and mobility: Secondary | ICD-10-CM | POA: Diagnosis not present

## 2021-01-01 DIAGNOSIS — M6281 Muscle weakness (generalized): Secondary | ICD-10-CM | POA: Diagnosis not present

## 2021-01-01 DIAGNOSIS — R2681 Unsteadiness on feet: Secondary | ICD-10-CM | POA: Diagnosis not present

## 2021-01-02 DIAGNOSIS — M6281 Muscle weakness (generalized): Secondary | ICD-10-CM | POA: Diagnosis not present

## 2021-01-02 DIAGNOSIS — R2681 Unsteadiness on feet: Secondary | ICD-10-CM | POA: Diagnosis not present

## 2021-01-02 DIAGNOSIS — R2689 Other abnormalities of gait and mobility: Secondary | ICD-10-CM | POA: Diagnosis not present

## 2021-01-04 DIAGNOSIS — R2681 Unsteadiness on feet: Secondary | ICD-10-CM | POA: Diagnosis not present

## 2021-01-04 DIAGNOSIS — R2689 Other abnormalities of gait and mobility: Secondary | ICD-10-CM | POA: Diagnosis not present

## 2021-01-04 DIAGNOSIS — M6281 Muscle weakness (generalized): Secondary | ICD-10-CM | POA: Diagnosis not present

## 2021-01-05 DIAGNOSIS — M6281 Muscle weakness (generalized): Secondary | ICD-10-CM | POA: Diagnosis not present

## 2021-01-05 DIAGNOSIS — R2681 Unsteadiness on feet: Secondary | ICD-10-CM | POA: Diagnosis not present

## 2021-01-05 DIAGNOSIS — R2689 Other abnormalities of gait and mobility: Secondary | ICD-10-CM | POA: Diagnosis not present

## 2021-01-06 DIAGNOSIS — R2681 Unsteadiness on feet: Secondary | ICD-10-CM | POA: Diagnosis not present

## 2021-01-06 DIAGNOSIS — M6281 Muscle weakness (generalized): Secondary | ICD-10-CM | POA: Diagnosis not present

## 2021-01-06 DIAGNOSIS — R2689 Other abnormalities of gait and mobility: Secondary | ICD-10-CM | POA: Diagnosis not present

## 2021-01-07 DIAGNOSIS — R2689 Other abnormalities of gait and mobility: Secondary | ICD-10-CM | POA: Diagnosis not present

## 2021-01-07 DIAGNOSIS — M6281 Muscle weakness (generalized): Secondary | ICD-10-CM | POA: Diagnosis not present

## 2021-01-07 DIAGNOSIS — R2681 Unsteadiness on feet: Secondary | ICD-10-CM | POA: Diagnosis not present

## 2021-01-11 DIAGNOSIS — R2681 Unsteadiness on feet: Secondary | ICD-10-CM | POA: Diagnosis not present

## 2021-01-11 DIAGNOSIS — M6281 Muscle weakness (generalized): Secondary | ICD-10-CM | POA: Diagnosis not present

## 2021-01-11 DIAGNOSIS — R2689 Other abnormalities of gait and mobility: Secondary | ICD-10-CM | POA: Diagnosis not present

## 2021-01-12 DIAGNOSIS — R2681 Unsteadiness on feet: Secondary | ICD-10-CM | POA: Diagnosis not present

## 2021-01-12 DIAGNOSIS — M6281 Muscle weakness (generalized): Secondary | ICD-10-CM | POA: Diagnosis not present

## 2021-01-12 DIAGNOSIS — R2689 Other abnormalities of gait and mobility: Secondary | ICD-10-CM | POA: Diagnosis not present

## 2021-01-14 DIAGNOSIS — R2681 Unsteadiness on feet: Secondary | ICD-10-CM | POA: Diagnosis not present

## 2021-01-14 DIAGNOSIS — R2689 Other abnormalities of gait and mobility: Secondary | ICD-10-CM | POA: Diagnosis not present

## 2021-01-14 DIAGNOSIS — M6281 Muscle weakness (generalized): Secondary | ICD-10-CM | POA: Diagnosis not present

## 2021-01-18 DIAGNOSIS — R2681 Unsteadiness on feet: Secondary | ICD-10-CM | POA: Diagnosis not present

## 2021-01-18 DIAGNOSIS — R2689 Other abnormalities of gait and mobility: Secondary | ICD-10-CM | POA: Diagnosis not present

## 2021-01-18 DIAGNOSIS — M6281 Muscle weakness (generalized): Secondary | ICD-10-CM | POA: Diagnosis not present

## 2021-01-20 DIAGNOSIS — M6281 Muscle weakness (generalized): Secondary | ICD-10-CM | POA: Diagnosis not present

## 2021-01-20 DIAGNOSIS — R2689 Other abnormalities of gait and mobility: Secondary | ICD-10-CM | POA: Diagnosis not present

## 2021-01-20 DIAGNOSIS — R2681 Unsteadiness on feet: Secondary | ICD-10-CM | POA: Diagnosis not present

## 2021-01-21 DIAGNOSIS — R2689 Other abnormalities of gait and mobility: Secondary | ICD-10-CM | POA: Diagnosis not present

## 2021-01-21 DIAGNOSIS — R2681 Unsteadiness on feet: Secondary | ICD-10-CM | POA: Diagnosis not present

## 2021-01-21 DIAGNOSIS — M6281 Muscle weakness (generalized): Secondary | ICD-10-CM | POA: Diagnosis not present

## 2021-01-22 ENCOUNTER — Other Ambulatory Visit: Payer: Self-pay

## 2021-01-22 ENCOUNTER — Ambulatory Visit (INDEPENDENT_AMBULATORY_CARE_PROVIDER_SITE_OTHER): Payer: Medicare Other | Admitting: Podiatry

## 2021-01-22 ENCOUNTER — Encounter: Payer: Self-pay | Admitting: Podiatry

## 2021-01-22 DIAGNOSIS — M79674 Pain in right toe(s): Secondary | ICD-10-CM | POA: Diagnosis not present

## 2021-01-22 DIAGNOSIS — L84 Corns and callosities: Secondary | ICD-10-CM

## 2021-01-22 DIAGNOSIS — M79675 Pain in left toe(s): Secondary | ICD-10-CM | POA: Diagnosis not present

## 2021-01-22 DIAGNOSIS — B351 Tinea unguium: Secondary | ICD-10-CM

## 2021-01-22 DIAGNOSIS — E1142 Type 2 diabetes mellitus with diabetic polyneuropathy: Secondary | ICD-10-CM

## 2021-01-22 NOTE — Progress Notes (Signed)
This patient returns to my office for at risk foot care.  This patient requires this care by a professional since this patient will be at risk due to having  Diabetic neuropathy and chronic kidney disease.  This patient is unable to cut nails herself since the patient cannot reach her nails.These nails are painful walking and wearing shoes.  This patient presents for at risk foot care today.She also has thick painful callus under her big toe joint right foot. She presents to the office with her POA.  General Appearance  Alert, conversant and in no acute stress.  Vascular  Dorsalis pedis and posterior tibial  pulses are palpable  bilaterally.  Capillary return is within normal limits  bilaterally. Temperature is within normal limits  bilaterally.  Neurologic  Senn-Weinstein monofilament wire test within normal limits  bilaterally. Muscle power within normal limits bilaterally.  Nails Thick disfigured discolored nails with subungual debris  from hallux to fifth toes bilaterally. No evidence of bacterial infection or drainage bilaterally.  Orthopedic  No limitations of motion  feet .  No crepitus or effusions noted.  No bony pathology or digital deformities noted.  Skin  normotropic skin with no porokeratosis noted bilaterally.  No signs of infections or ulcers noted.   Callus sub 1 right froot.  Onychomycosis  Pain in right toes  Pain in left toes  Consent was obtained for treatment procedures.   Mechanical debridement of nails 1-5  bilaterally performed with a nail nipper.  Filed with dremel without incident. Debride callus with # 15 blade followed by dremel tool usage.   Return office visit   3 months                   Told patient to return for periodic foot care and evaluation due to potential at risk complications.   Gardiner Barefoot DPM

## 2021-01-25 DIAGNOSIS — R2689 Other abnormalities of gait and mobility: Secondary | ICD-10-CM | POA: Diagnosis not present

## 2021-01-25 DIAGNOSIS — R2681 Unsteadiness on feet: Secondary | ICD-10-CM | POA: Diagnosis not present

## 2021-01-25 DIAGNOSIS — M6281 Muscle weakness (generalized): Secondary | ICD-10-CM | POA: Diagnosis not present

## 2021-01-26 DIAGNOSIS — R55 Syncope and collapse: Secondary | ICD-10-CM | POA: Diagnosis not present

## 2021-01-26 DIAGNOSIS — M6281 Muscle weakness (generalized): Secondary | ICD-10-CM | POA: Diagnosis not present

## 2021-01-26 DIAGNOSIS — R41841 Cognitive communication deficit: Secondary | ICD-10-CM | POA: Diagnosis not present

## 2021-01-26 DIAGNOSIS — R2689 Other abnormalities of gait and mobility: Secondary | ICD-10-CM | POA: Diagnosis not present

## 2021-01-29 DIAGNOSIS — R55 Syncope and collapse: Secondary | ICD-10-CM | POA: Diagnosis not present

## 2021-01-29 DIAGNOSIS — M6281 Muscle weakness (generalized): Secondary | ICD-10-CM | POA: Diagnosis not present

## 2021-01-29 DIAGNOSIS — R41841 Cognitive communication deficit: Secondary | ICD-10-CM | POA: Diagnosis not present

## 2021-01-29 DIAGNOSIS — R2689 Other abnormalities of gait and mobility: Secondary | ICD-10-CM | POA: Diagnosis not present

## 2021-02-01 DIAGNOSIS — M6281 Muscle weakness (generalized): Secondary | ICD-10-CM | POA: Diagnosis not present

## 2021-02-01 DIAGNOSIS — R2689 Other abnormalities of gait and mobility: Secondary | ICD-10-CM | POA: Diagnosis not present

## 2021-02-01 DIAGNOSIS — R41841 Cognitive communication deficit: Secondary | ICD-10-CM | POA: Diagnosis not present

## 2021-02-01 DIAGNOSIS — R55 Syncope and collapse: Secondary | ICD-10-CM | POA: Diagnosis not present

## 2021-02-04 DIAGNOSIS — R2689 Other abnormalities of gait and mobility: Secondary | ICD-10-CM | POA: Diagnosis not present

## 2021-02-04 DIAGNOSIS — R55 Syncope and collapse: Secondary | ICD-10-CM | POA: Diagnosis not present

## 2021-02-04 DIAGNOSIS — M6281 Muscle weakness (generalized): Secondary | ICD-10-CM | POA: Diagnosis not present

## 2021-02-04 DIAGNOSIS — R41841 Cognitive communication deficit: Secondary | ICD-10-CM | POA: Diagnosis not present

## 2021-02-05 DIAGNOSIS — R41841 Cognitive communication deficit: Secondary | ICD-10-CM | POA: Diagnosis not present

## 2021-02-05 DIAGNOSIS — R55 Syncope and collapse: Secondary | ICD-10-CM | POA: Diagnosis not present

## 2021-02-05 DIAGNOSIS — M6281 Muscle weakness (generalized): Secondary | ICD-10-CM | POA: Diagnosis not present

## 2021-02-05 DIAGNOSIS — R2689 Other abnormalities of gait and mobility: Secondary | ICD-10-CM | POA: Diagnosis not present

## 2021-02-08 DIAGNOSIS — R55 Syncope and collapse: Secondary | ICD-10-CM | POA: Diagnosis not present

## 2021-02-08 DIAGNOSIS — R41841 Cognitive communication deficit: Secondary | ICD-10-CM | POA: Diagnosis not present

## 2021-02-08 DIAGNOSIS — M6281 Muscle weakness (generalized): Secondary | ICD-10-CM | POA: Diagnosis not present

## 2021-02-08 DIAGNOSIS — R2689 Other abnormalities of gait and mobility: Secondary | ICD-10-CM | POA: Diagnosis not present

## 2021-02-09 DIAGNOSIS — R2689 Other abnormalities of gait and mobility: Secondary | ICD-10-CM | POA: Diagnosis not present

## 2021-02-09 DIAGNOSIS — R41841 Cognitive communication deficit: Secondary | ICD-10-CM | POA: Diagnosis not present

## 2021-02-09 DIAGNOSIS — M6281 Muscle weakness (generalized): Secondary | ICD-10-CM | POA: Diagnosis not present

## 2021-02-09 DIAGNOSIS — R55 Syncope and collapse: Secondary | ICD-10-CM | POA: Diagnosis not present

## 2021-02-13 DIAGNOSIS — M6281 Muscle weakness (generalized): Secondary | ICD-10-CM | POA: Diagnosis not present

## 2021-02-13 DIAGNOSIS — R41841 Cognitive communication deficit: Secondary | ICD-10-CM | POA: Diagnosis not present

## 2021-02-13 DIAGNOSIS — R55 Syncope and collapse: Secondary | ICD-10-CM | POA: Diagnosis not present

## 2021-02-13 DIAGNOSIS — R2689 Other abnormalities of gait and mobility: Secondary | ICD-10-CM | POA: Diagnosis not present

## 2021-02-15 DIAGNOSIS — M6281 Muscle weakness (generalized): Secondary | ICD-10-CM | POA: Diagnosis not present

## 2021-02-15 DIAGNOSIS — R2689 Other abnormalities of gait and mobility: Secondary | ICD-10-CM | POA: Diagnosis not present

## 2021-02-15 DIAGNOSIS — R55 Syncope and collapse: Secondary | ICD-10-CM | POA: Diagnosis not present

## 2021-02-15 DIAGNOSIS — R41841 Cognitive communication deficit: Secondary | ICD-10-CM | POA: Diagnosis not present

## 2021-02-16 DIAGNOSIS — R55 Syncope and collapse: Secondary | ICD-10-CM | POA: Diagnosis not present

## 2021-02-16 DIAGNOSIS — R2689 Other abnormalities of gait and mobility: Secondary | ICD-10-CM | POA: Diagnosis not present

## 2021-02-16 DIAGNOSIS — M6281 Muscle weakness (generalized): Secondary | ICD-10-CM | POA: Diagnosis not present

## 2021-02-16 DIAGNOSIS — R41841 Cognitive communication deficit: Secondary | ICD-10-CM | POA: Diagnosis not present

## 2021-02-19 DIAGNOSIS — R2689 Other abnormalities of gait and mobility: Secondary | ICD-10-CM | POA: Diagnosis not present

## 2021-02-19 DIAGNOSIS — R55 Syncope and collapse: Secondary | ICD-10-CM | POA: Diagnosis not present

## 2021-02-19 DIAGNOSIS — R41841 Cognitive communication deficit: Secondary | ICD-10-CM | POA: Diagnosis not present

## 2021-02-19 DIAGNOSIS — M6281 Muscle weakness (generalized): Secondary | ICD-10-CM | POA: Diagnosis not present

## 2021-02-22 DIAGNOSIS — R2689 Other abnormalities of gait and mobility: Secondary | ICD-10-CM | POA: Diagnosis not present

## 2021-02-22 DIAGNOSIS — R55 Syncope and collapse: Secondary | ICD-10-CM | POA: Diagnosis not present

## 2021-02-22 DIAGNOSIS — M6281 Muscle weakness (generalized): Secondary | ICD-10-CM | POA: Diagnosis not present

## 2021-02-22 DIAGNOSIS — R41841 Cognitive communication deficit: Secondary | ICD-10-CM | POA: Diagnosis not present

## 2021-02-25 DIAGNOSIS — R2689 Other abnormalities of gait and mobility: Secondary | ICD-10-CM | POA: Diagnosis not present

## 2021-02-25 DIAGNOSIS — R2681 Unsteadiness on feet: Secondary | ICD-10-CM | POA: Diagnosis not present

## 2021-02-25 DIAGNOSIS — M6281 Muscle weakness (generalized): Secondary | ICD-10-CM | POA: Diagnosis not present

## 2021-02-26 DIAGNOSIS — M6281 Muscle weakness (generalized): Secondary | ICD-10-CM | POA: Diagnosis not present

## 2021-02-26 DIAGNOSIS — R2689 Other abnormalities of gait and mobility: Secondary | ICD-10-CM | POA: Diagnosis not present

## 2021-02-26 DIAGNOSIS — R2681 Unsteadiness on feet: Secondary | ICD-10-CM | POA: Diagnosis not present

## 2021-03-01 DIAGNOSIS — R2689 Other abnormalities of gait and mobility: Secondary | ICD-10-CM | POA: Diagnosis not present

## 2021-03-01 DIAGNOSIS — R2681 Unsteadiness on feet: Secondary | ICD-10-CM | POA: Diagnosis not present

## 2021-03-01 DIAGNOSIS — M6281 Muscle weakness (generalized): Secondary | ICD-10-CM | POA: Diagnosis not present

## 2021-03-10 DIAGNOSIS — E1122 Type 2 diabetes mellitus with diabetic chronic kidney disease: Secondary | ICD-10-CM | POA: Diagnosis not present

## 2021-03-10 DIAGNOSIS — E785 Hyperlipidemia, unspecified: Secondary | ICD-10-CM | POA: Diagnosis not present

## 2021-03-10 DIAGNOSIS — R5383 Other fatigue: Secondary | ICD-10-CM | POA: Diagnosis not present

## 2021-03-10 DIAGNOSIS — I129 Hypertensive chronic kidney disease with stage 1 through stage 4 chronic kidney disease, or unspecified chronic kidney disease: Secondary | ICD-10-CM | POA: Diagnosis not present

## 2021-03-24 ENCOUNTER — Other Ambulatory Visit: Payer: Self-pay

## 2021-03-24 ENCOUNTER — Emergency Department (HOSPITAL_COMMUNITY)
Admission: EM | Admit: 2021-03-24 | Discharge: 2021-03-25 | Disposition: A | Discharge status: Home / Self Care | Payer: Medicare Other | Attending: Emergency Medicine | Admitting: Emergency Medicine

## 2021-03-24 ENCOUNTER — Encounter (HOSPITAL_COMMUNITY): Payer: Self-pay | Admitting: Oncology

## 2021-03-24 ENCOUNTER — Emergency Department (HOSPITAL_COMMUNITY): Payer: Medicare Other

## 2021-03-24 DIAGNOSIS — I130 Hypertensive heart and chronic kidney disease with heart failure and stage 1 through stage 4 chronic kidney disease, or unspecified chronic kidney disease: Secondary | ICD-10-CM | POA: Insufficient documentation

## 2021-03-24 DIAGNOSIS — F840 Autistic disorder: Secondary | ICD-10-CM | POA: Insufficient documentation

## 2021-03-24 DIAGNOSIS — Z7982 Long term (current) use of aspirin: Secondary | ICD-10-CM | POA: Diagnosis not present

## 2021-03-24 DIAGNOSIS — W19XXXA Unspecified fall, initial encounter: Secondary | ICD-10-CM

## 2021-03-24 DIAGNOSIS — Z794 Long term (current) use of insulin: Secondary | ICD-10-CM | POA: Insufficient documentation

## 2021-03-24 DIAGNOSIS — I5032 Chronic diastolic (congestive) heart failure: Secondary | ICD-10-CM | POA: Insufficient documentation

## 2021-03-24 DIAGNOSIS — F0390 Unspecified dementia without behavioral disturbance: Secondary | ICD-10-CM | POA: Diagnosis not present

## 2021-03-24 DIAGNOSIS — W01198A Fall on same level from slipping, tripping and stumbling with subsequent striking against other object, initial encounter: Secondary | ICD-10-CM | POA: Diagnosis not present

## 2021-03-24 DIAGNOSIS — Y9389 Activity, other specified: Secondary | ICD-10-CM | POA: Diagnosis not present

## 2021-03-24 DIAGNOSIS — Z743 Need for continuous supervision: Secondary | ICD-10-CM | POA: Diagnosis not present

## 2021-03-24 DIAGNOSIS — N183 Chronic kidney disease, stage 3 unspecified: Secondary | ICD-10-CM | POA: Insufficient documentation

## 2021-03-24 DIAGNOSIS — E1122 Type 2 diabetes mellitus with diabetic chronic kidney disease: Secondary | ICD-10-CM | POA: Diagnosis not present

## 2021-03-24 DIAGNOSIS — I639 Cerebral infarction, unspecified: Secondary | ICD-10-CM | POA: Diagnosis not present

## 2021-03-24 DIAGNOSIS — Z7984 Long term (current) use of oral hypoglycemic drugs: Secondary | ICD-10-CM | POA: Diagnosis not present

## 2021-03-24 DIAGNOSIS — R404 Transient alteration of awareness: Secondary | ICD-10-CM | POA: Diagnosis not present

## 2021-03-24 DIAGNOSIS — G319 Degenerative disease of nervous system, unspecified: Secondary | ICD-10-CM | POA: Diagnosis not present

## 2021-03-24 DIAGNOSIS — Z79899 Other long term (current) drug therapy: Secondary | ICD-10-CM | POA: Insufficient documentation

## 2021-03-24 DIAGNOSIS — Y9289 Other specified places as the place of occurrence of the external cause: Secondary | ICD-10-CM | POA: Diagnosis not present

## 2021-03-24 DIAGNOSIS — I10 Essential (primary) hypertension: Secondary | ICD-10-CM | POA: Diagnosis not present

## 2021-03-24 DIAGNOSIS — S0990XA Unspecified injury of head, initial encounter: Secondary | ICD-10-CM | POA: Insufficient documentation

## 2021-03-24 DIAGNOSIS — D170 Benign lipomatous neoplasm of skin and subcutaneous tissue of head, face and neck: Secondary | ICD-10-CM | POA: Diagnosis not present

## 2021-03-24 DIAGNOSIS — R739 Hyperglycemia, unspecified: Secondary | ICD-10-CM | POA: Diagnosis not present

## 2021-03-24 MED ORDER — HYDRALAZINE HCL 10 MG PO TABS
10.0000 mg | ORAL_TABLET | Freq: Once | ORAL | Status: AC
  Administered 2021-03-24: 20:00:00 10 mg via ORAL
  Filled 2021-03-24: qty 1

## 2021-03-24 MED ORDER — VERAPAMIL HCL ER 120 MG PO TBCR
120.0000 mg | EXTENDED_RELEASE_TABLET | Freq: Once | ORAL | Status: AC
  Administered 2021-03-24: 22:00:00 120 mg via ORAL
  Filled 2021-03-24: qty 1

## 2021-03-24 MED ORDER — HYDRALAZINE HCL 10 MG PO TABS
10.0000 mg | ORAL_TABLET | Freq: Once | ORAL | Status: AC
  Administered 2021-03-24: 18:00:00 10 mg via ORAL
  Filled 2021-03-24: qty 1

## 2021-03-24 MED ORDER — NITROGLYCERIN 2 % TD OINT
1.0000 [in_us] | TOPICAL_OINTMENT | Freq: Once | TRANSDERMAL | Status: AC
  Administered 2021-03-24: 20:00:00 1 [in_us] via TOPICAL
  Filled 2021-03-24: qty 1

## 2021-03-24 NOTE — ED Triage Notes (Signed)
Pt bib [facility] from [facility] s/p fall x 2.  Pt had one witnessed fall in the dining area as she went to sit she missed the chair and fell on her buttocks.  Leaving the dining hall pt tripped and fell again striking her head.  No LOC or anticoagulants. Pt denies pain.  C-collar in place as pt has memory impairment at baseline.   EMS VS 158/90 90 95% 303 CBG

## 2021-03-24 NOTE — ED Notes (Signed)
Patient transported to CT 

## 2021-03-24 NOTE — Discharge Instructions (Signed)
CAT scan of the head was negative.  If you have persistent vomiting or changes in mental status you should return
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
## 2021-03-25 DIAGNOSIS — I10 Essential (primary) hypertension: Secondary | ICD-10-CM | POA: Diagnosis not present

## 2021-03-25 DIAGNOSIS — R6889 Other general symptoms and signs: Secondary | ICD-10-CM | POA: Diagnosis not present

## 2021-03-25 DIAGNOSIS — Z743 Need for continuous supervision: Secondary | ICD-10-CM | POA: Diagnosis not present

## 2021-03-30 ENCOUNTER — Other Ambulatory Visit: Payer: Self-pay

## 2021-03-30 ENCOUNTER — Emergency Department (HOSPITAL_COMMUNITY): Payer: Medicare Other

## 2021-03-30 ENCOUNTER — Inpatient Hospital Stay (HOSPITAL_COMMUNITY)
Admission: EM | Admit: 2021-03-30 | Discharge: 2021-04-08 | DRG: 683 | Disposition: A | Discharge status: Skilled Nursing Facility | Payer: Medicare Other | Source: Skilled Nursing Facility | Attending: Family Medicine | Admitting: Family Medicine

## 2021-03-30 DIAGNOSIS — B964 Proteus (mirabilis) (morganii) as the cause of diseases classified elsewhere: Secondary | ICD-10-CM | POA: Diagnosis present

## 2021-03-30 DIAGNOSIS — W19XXXA Unspecified fall, initial encounter: Secondary | ICD-10-CM | POA: Diagnosis present

## 2021-03-30 DIAGNOSIS — E440 Moderate protein-calorie malnutrition: Secondary | ICD-10-CM | POA: Insufficient documentation

## 2021-03-30 DIAGNOSIS — S2231XA Fracture of one rib, right side, initial encounter for closed fracture: Secondary | ICD-10-CM | POA: Diagnosis present

## 2021-03-30 DIAGNOSIS — E1140 Type 2 diabetes mellitus with diabetic neuropathy, unspecified: Secondary | ICD-10-CM | POA: Diagnosis present

## 2021-03-30 DIAGNOSIS — Z6821 Body mass index (BMI) 21.0-21.9, adult: Secondary | ICD-10-CM

## 2021-03-30 DIAGNOSIS — E119 Type 2 diabetes mellitus without complications: Secondary | ICD-10-CM

## 2021-03-30 DIAGNOSIS — N179 Acute kidney failure, unspecified: Secondary | ICD-10-CM | POA: Diagnosis not present

## 2021-03-30 DIAGNOSIS — Z7984 Long term (current) use of oral hypoglycemic drugs: Secondary | ICD-10-CM

## 2021-03-30 DIAGNOSIS — S92514A Nondisplaced fracture of proximal phalanx of right lesser toe(s), initial encounter for closed fracture: Secondary | ICD-10-CM | POA: Diagnosis not present

## 2021-03-30 DIAGNOSIS — Z90710 Acquired absence of both cervix and uterus: Secondary | ICD-10-CM

## 2021-03-30 DIAGNOSIS — S92351A Displaced fracture of fifth metatarsal bone, right foot, initial encounter for closed fracture: Secondary | ICD-10-CM | POA: Diagnosis present

## 2021-03-30 DIAGNOSIS — R5381 Other malaise: Secondary | ICD-10-CM | POA: Diagnosis present

## 2021-03-30 DIAGNOSIS — R0689 Other abnormalities of breathing: Secondary | ICD-10-CM | POA: Diagnosis not present

## 2021-03-30 DIAGNOSIS — Z1629 Resistance to other single specified antibiotic: Secondary | ICD-10-CM | POA: Diagnosis present

## 2021-03-30 DIAGNOSIS — S92344A Nondisplaced fracture of fourth metatarsal bone, right foot, initial encounter for closed fracture: Secondary | ICD-10-CM | POA: Diagnosis not present

## 2021-03-30 DIAGNOSIS — Z8673 Personal history of transient ischemic attack (TIA), and cerebral infarction without residual deficits: Secondary | ICD-10-CM | POA: Diagnosis not present

## 2021-03-30 DIAGNOSIS — F0390 Unspecified dementia without behavioral disturbance: Secondary | ICD-10-CM | POA: Diagnosis not present

## 2021-03-30 DIAGNOSIS — E1129 Type 2 diabetes mellitus with other diabetic kidney complication: Secondary | ICD-10-CM | POA: Diagnosis not present

## 2021-03-30 DIAGNOSIS — I10 Essential (primary) hypertension: Secondary | ICD-10-CM | POA: Diagnosis not present

## 2021-03-30 DIAGNOSIS — S92334A Nondisplaced fracture of third metatarsal bone, right foot, initial encounter for closed fracture: Secondary | ICD-10-CM | POA: Diagnosis not present

## 2021-03-30 DIAGNOSIS — R778 Other specified abnormalities of plasma proteins: Secondary | ICD-10-CM | POA: Diagnosis not present

## 2021-03-30 DIAGNOSIS — S92301A Fracture of unspecified metatarsal bone(s), right foot, initial encounter for closed fracture: Secondary | ICD-10-CM | POA: Diagnosis not present

## 2021-03-30 DIAGNOSIS — F840 Autistic disorder: Secondary | ICD-10-CM | POA: Diagnosis not present

## 2021-03-30 DIAGNOSIS — B9620 Unspecified Escherichia coli [E. coli] as the cause of diseases classified elsewhere: Secondary | ICD-10-CM | POA: Diagnosis present

## 2021-03-30 DIAGNOSIS — Z743 Need for continuous supervision: Secondary | ICD-10-CM | POA: Diagnosis not present

## 2021-03-30 DIAGNOSIS — S92354A Nondisplaced fracture of fifth metatarsal bone, right foot, initial encounter for closed fracture: Secondary | ICD-10-CM | POA: Diagnosis not present

## 2021-03-30 DIAGNOSIS — E559 Vitamin D deficiency, unspecified: Secondary | ICD-10-CM | POA: Diagnosis present

## 2021-03-30 DIAGNOSIS — E041 Nontoxic single thyroid nodule: Secondary | ICD-10-CM | POA: Diagnosis present

## 2021-03-30 DIAGNOSIS — F419 Anxiety disorder, unspecified: Secondary | ICD-10-CM | POA: Diagnosis present

## 2021-03-30 DIAGNOSIS — Z7982 Long term (current) use of aspirin: Secondary | ICD-10-CM | POA: Diagnosis not present

## 2021-03-30 DIAGNOSIS — S92331A Displaced fracture of third metatarsal bone, right foot, initial encounter for closed fracture: Secondary | ICD-10-CM | POA: Diagnosis present

## 2021-03-30 DIAGNOSIS — Z8249 Family history of ischemic heart disease and other diseases of the circulatory system: Secondary | ICD-10-CM

## 2021-03-30 DIAGNOSIS — I959 Hypotension, unspecified: Secondary | ICD-10-CM | POA: Diagnosis not present

## 2021-03-30 DIAGNOSIS — Z794 Long term (current) use of insulin: Secondary | ICD-10-CM

## 2021-03-30 DIAGNOSIS — I5032 Chronic diastolic (congestive) heart failure: Secondary | ICD-10-CM | POA: Diagnosis present

## 2021-03-30 DIAGNOSIS — R2681 Unsteadiness on feet: Secondary | ICD-10-CM | POA: Diagnosis not present

## 2021-03-30 DIAGNOSIS — R531 Weakness: Secondary | ICD-10-CM | POA: Diagnosis not present

## 2021-03-30 DIAGNOSIS — M7989 Other specified soft tissue disorders: Secondary | ICD-10-CM | POA: Diagnosis not present

## 2021-03-30 DIAGNOSIS — E1122 Type 2 diabetes mellitus with diabetic chronic kidney disease: Secondary | ICD-10-CM | POA: Diagnosis present

## 2021-03-30 DIAGNOSIS — Z66 Do not resuscitate: Secondary | ICD-10-CM | POA: Diagnosis present

## 2021-03-30 DIAGNOSIS — D649 Anemia, unspecified: Secondary | ICD-10-CM | POA: Diagnosis not present

## 2021-03-30 DIAGNOSIS — Z20822 Contact with and (suspected) exposure to covid-19: Secondary | ICD-10-CM | POA: Diagnosis present

## 2021-03-30 DIAGNOSIS — E1121 Type 2 diabetes mellitus with diabetic nephropathy: Secondary | ICD-10-CM | POA: Diagnosis not present

## 2021-03-30 DIAGNOSIS — N183 Chronic kidney disease, stage 3 unspecified: Secondary | ICD-10-CM | POA: Diagnosis present

## 2021-03-30 DIAGNOSIS — R9431 Abnormal electrocardiogram [ECG] [EKG]: Secondary | ICD-10-CM | POA: Diagnosis not present

## 2021-03-30 DIAGNOSIS — R32 Unspecified urinary incontinence: Secondary | ICD-10-CM | POA: Diagnosis present

## 2021-03-30 DIAGNOSIS — M6281 Muscle weakness (generalized): Secondary | ICD-10-CM | POA: Diagnosis not present

## 2021-03-30 DIAGNOSIS — S92411A Displaced fracture of proximal phalanx of right great toe, initial encounter for closed fracture: Secondary | ICD-10-CM | POA: Diagnosis present

## 2021-03-30 DIAGNOSIS — R1314 Dysphagia, pharyngoesophageal phase: Secondary | ICD-10-CM | POA: Diagnosis not present

## 2021-03-30 DIAGNOSIS — I130 Hypertensive heart and chronic kidney disease with heart failure and stage 1 through stage 4 chronic kidney disease, or unspecified chronic kidney disease: Secondary | ICD-10-CM | POA: Diagnosis present

## 2021-03-30 DIAGNOSIS — F809 Developmental disorder of speech and language, unspecified: Secondary | ICD-10-CM | POA: Diagnosis not present

## 2021-03-30 DIAGNOSIS — R7989 Other specified abnormal findings of blood chemistry: Secondary | ICD-10-CM

## 2021-03-30 DIAGNOSIS — E785 Hyperlipidemia, unspecified: Secondary | ICD-10-CM | POA: Diagnosis present

## 2021-03-30 DIAGNOSIS — N1831 Chronic kidney disease, stage 3a: Secondary | ICD-10-CM | POA: Diagnosis not present

## 2021-03-30 DIAGNOSIS — D631 Anemia in chronic kidney disease: Secondary | ICD-10-CM | POA: Diagnosis present

## 2021-03-30 DIAGNOSIS — Z79899 Other long term (current) drug therapy: Secondary | ICD-10-CM

## 2021-03-30 DIAGNOSIS — N390 Urinary tract infection, site not specified: Secondary | ICD-10-CM | POA: Diagnosis present

## 2021-03-30 DIAGNOSIS — F32A Depression, unspecified: Secondary | ICD-10-CM | POA: Diagnosis not present

## 2021-03-30 DIAGNOSIS — S92301D Fracture of unspecified metatarsal bone(s), right foot, subsequent encounter for fracture with routine healing: Secondary | ICD-10-CM | POA: Diagnosis not present

## 2021-03-30 DIAGNOSIS — Z741 Need for assistance with personal care: Secondary | ICD-10-CM | POA: Diagnosis not present

## 2021-03-30 DIAGNOSIS — Z8571 Personal history of Hodgkin lymphoma: Secondary | ICD-10-CM

## 2021-03-30 DIAGNOSIS — S92341A Displaced fracture of fourth metatarsal bone, right foot, initial encounter for closed fracture: Secondary | ICD-10-CM | POA: Diagnosis present

## 2021-03-30 DIAGNOSIS — R251 Tremor, unspecified: Secondary | ICD-10-CM | POA: Diagnosis not present

## 2021-03-30 DIAGNOSIS — K219 Gastro-esophageal reflux disease without esophagitis: Secondary | ICD-10-CM | POA: Diagnosis not present

## 2021-03-30 DIAGNOSIS — R278 Other lack of coordination: Secondary | ICD-10-CM | POA: Diagnosis not present

## 2021-03-30 LAB — CBC WITH DIFFERENTIAL/PLATELET
Abs Immature Granulocytes: 0.14 10*3/uL — ABNORMAL HIGH (ref 0.00–0.07)
Basophils Absolute: 0.1 10*3/uL (ref 0.0–0.1)
Basophils Relative: 1 %
Eosinophils Absolute: 0.1 10*3/uL (ref 0.0–0.5)
Eosinophils Relative: 1 %
HCT: 41.7 % (ref 36.0–46.0)
Hemoglobin: 13.3 g/dL (ref 12.0–15.0)
Immature Granulocytes: 1 %
Lymphocytes Relative: 13 %
Lymphs Abs: 1.3 10*3/uL (ref 0.7–4.0)
MCH: 27.1 pg (ref 26.0–34.0)
MCHC: 31.9 g/dL (ref 30.0–36.0)
MCV: 85.1 fL (ref 80.0–100.0)
Monocytes Absolute: 0.5 10*3/uL (ref 0.1–1.0)
Monocytes Relative: 5 %
Neutro Abs: 8.4 10*3/uL — ABNORMAL HIGH (ref 1.7–7.7)
Neutrophils Relative %: 79 %
Platelets: 317 10*3/uL (ref 150–400)
RBC: 4.9 MIL/uL (ref 3.87–5.11)
RDW: 14.7 % (ref 11.5–15.5)
WBC: 10.5 10*3/uL (ref 4.0–10.5)
nRBC: 0 % (ref 0.0–0.2)

## 2021-03-30 LAB — URINALYSIS, ROUTINE W REFLEX MICROSCOPIC
Bilirubin Urine: NEGATIVE
Glucose, UA: NEGATIVE mg/dL
Ketones, ur: 20 mg/dL — AB
Nitrite: NEGATIVE
Protein, ur: >=300 mg/dL — AB
Specific Gravity, Urine: 1.015 (ref 1.005–1.030)
pH: 8 (ref 5.0–8.0)

## 2021-03-30 LAB — COMPREHENSIVE METABOLIC PANEL
ALT: 12 U/L (ref 0–44)
AST: 13 U/L — ABNORMAL LOW (ref 15–41)
Albumin: 3.4 g/dL — ABNORMAL LOW (ref 3.5–5.0)
Alkaline Phosphatase: 103 U/L (ref 38–126)
Anion gap: 8 (ref 5–15)
BUN: 43 mg/dL — ABNORMAL HIGH (ref 8–23)
CO2: 21 mmol/L — ABNORMAL LOW (ref 22–32)
Calcium: 9.1 mg/dL (ref 8.9–10.3)
Chloride: 108 mmol/L (ref 98–111)
Creatinine, Ser: 1.41 mg/dL — ABNORMAL HIGH (ref 0.44–1.00)
GFR, Estimated: 36 mL/min — ABNORMAL LOW (ref >60)
Glucose, Bld: 127 mg/dL — ABNORMAL HIGH (ref 70–99)
Potassium: 4.5 mmol/L (ref 3.5–5.1)
Sodium: 137 mmol/L (ref 135–145)
Total Bilirubin: 0.9 mg/dL (ref 0.3–1.2)
Total Protein: 7.4 g/dL (ref 6.5–8.1)

## 2021-03-30 LAB — RESP PANEL BY RT-PCR (FLU A&B, COVID) ARPGX2
Influenza A by PCR: NEGATIVE
Influenza B by PCR: NEGATIVE
SARS Coronavirus 2 by RT PCR: NEGATIVE

## 2021-03-30 LAB — CBG MONITORING, ED
Glucose-Capillary: 117 mg/dL — ABNORMAL HIGH (ref 70–99)
Glucose-Capillary: 112 mg/dL — ABNORMAL HIGH (ref 70–99)
Glucose-Capillary: 107 mg/dL — ABNORMAL HIGH (ref 70–99)

## 2021-03-30 LAB — TROPONIN I (HIGH SENSITIVITY)
Troponin I (High Sensitivity): 49 ng/L — ABNORMAL HIGH (ref <18)
Troponin I (High Sensitivity): 47 ng/L — ABNORMAL HIGH (ref <18)

## 2021-03-30 MED ORDER — ONDANSETRON HCL 4 MG/2ML IJ SOLN: 4.0000 mg | Freq: Four times a day (QID) | INTRAMUSCULAR | Status: DC | PRN

## 2021-03-30 MED ORDER — SODIUM CHLORIDE 0.45 % IV SOLN: INTRAVENOUS | Status: DC

## 2021-03-30 MED ORDER — HYDRALAZINE HCL 25 MG PO TABS
25.0000 mg | ORAL_TABLET | Freq: Three times a day (TID) | ORAL | Status: DC | PRN
  Administered 2021-03-30 – 2021-04-06 (×3): 25 mg via ORAL
  Filled 2021-03-30 (×4): qty 1

## 2021-03-30 MED ORDER — SODIUM CHLORIDE 0.45 % IV BOLUS
1000.0000 mL | Freq: Once | INTRAVENOUS | Status: AC
  Administered 2021-03-30: 1000 mL via INTRAVENOUS

## 2021-03-30 MED ORDER — SODIUM FLUORIDE 1.1 % DT PSTE: PASTE | Freq: Every evening | DENTAL | Status: DC

## 2021-03-30 MED ORDER — ACETAMINOPHEN 325 MG PO TABS: 650.0000 mg | ORAL_TABLET | Freq: Four times a day (QID) | ORAL | Status: DC | PRN

## 2021-03-30 MED ORDER — OXYCODONE HCL 5 MG PO TABS
2.5000 mg | ORAL_TABLET | ORAL | Status: DC | PRN
  Administered 2021-03-30 – 2021-04-01 (×2): 2.5 mg via ORAL
  Filled 2021-03-30 (×2): qty 1

## 2021-03-30 MED ORDER — INSULIN GLARGINE 100 UNIT/ML SOLOSTAR PEN: 15.0000 [IU] | PEN_INJECTOR | Freq: Every day | SUBCUTANEOUS | Status: DC

## 2021-03-30 MED ORDER — PANTOPRAZOLE SODIUM 40 MG PO TBEC
40.0000 mg | DELAYED_RELEASE_TABLET | Freq: Every day | ORAL | Status: DC
  Administered 2021-03-31 – 2021-04-08 (×9): 40 mg via ORAL
  Filled 2021-03-30 (×9): qty 1

## 2021-03-30 MED ORDER — INSULIN GLARGINE-YFGN 100 UNIT/ML ~~LOC~~ SOLN
15.0000 [IU] | Freq: Every day | SUBCUTANEOUS | Status: DC
  Administered 2021-03-30 – 2021-04-02 (×4): 15 [IU] via SUBCUTANEOUS
  Filled 2021-03-30 (×5): qty 0.15

## 2021-03-30 MED ORDER — VERAPAMIL HCL ER 120 MG PO TBCR
120.0000 mg | EXTENDED_RELEASE_TABLET | Freq: Every day | ORAL | Status: DC
Start: 2021-03-30 — End: 2021-04-08
  Administered 2021-03-30 – 2021-04-07 (×9): 120 mg via ORAL
  Filled 2021-03-30 (×10): qty 1

## 2021-03-30 MED ORDER — INTEGRA 62.5-62.5-40-3 MG PO CAPS: 1.0000 | ORAL_CAPSULE | Freq: Every day | ORAL | Status: DC

## 2021-03-30 MED ORDER — CIPROFLOXACIN IN D5W 400 MG/200ML IV SOLN
400.0000 mg | Freq: Two times a day (BID) | INTRAVENOUS | Status: DC
  Administered 2021-03-30: 400 mg via INTRAVENOUS
  Filled 2021-03-30: qty 200

## 2021-03-30 MED ORDER — ONDANSETRON HCL 4 MG PO TABS: 4.0000 mg | ORAL_TABLET | Freq: Four times a day (QID) | ORAL | Status: DC | PRN

## 2021-03-30 MED ORDER — METOPROLOL TARTRATE 5 MG/5ML IV SOLN
5.0000 mg | Freq: Once | INTRAVENOUS | Status: AC
  Administered 2021-03-30: 5 mg via INTRAVENOUS
  Filled 2021-03-30: qty 5

## 2021-03-30 MED ORDER — ASPIRIN 81 MG PO CHEW
81.0000 mg | CHEWABLE_TABLET | Freq: Every day | ORAL | Status: DC
  Administered 2021-03-31 – 2021-04-08 (×9): 81 mg via ORAL
  Filled 2021-03-30 (×9): qty 1

## 2021-03-30 MED ORDER — ACETAMINOPHEN 650 MG RE SUPP: 650.0000 mg | Freq: Four times a day (QID) | RECTAL | Status: DC | PRN

## 2021-03-30 MED ORDER — HYDRALAZINE HCL 10 MG PO TABS
10.0000 mg | ORAL_TABLET | Freq: Three times a day (TID) | ORAL | Status: DC
  Administered 2021-03-30 – 2021-03-31 (×3): 10 mg via ORAL
  Filled 2021-03-30 (×3): qty 1

## 2021-03-30 MED ORDER — INSULIN ASPART 100 UNIT/ML IJ SOLN
0.0000 [IU] | Freq: Three times a day (TID) | INTRAMUSCULAR | Status: DC
  Administered 2021-03-31: 2 [IU] via SUBCUTANEOUS
  Administered 2021-04-02 – 2021-04-08 (×10): 1 [IU] via SUBCUTANEOUS
  Filled 2021-03-30: qty 0.09

## 2021-03-30 MED ORDER — METOPROLOL TARTRATE 12.5 MG HALF TABLET
12.5000 mg | ORAL_TABLET | Freq: Two times a day (BID) | ORAL | Status: DC
  Administered 2021-03-30 – 2021-04-08 (×18): 12.5 mg via ORAL
  Filled 2021-03-30 (×18): qty 1

## 2021-03-30 MED ORDER — ENOXAPARIN SODIUM 40 MG/0.4ML IJ SOSY: 40.0000 mg | PREFILLED_SYRINGE | INTRAMUSCULAR | Status: DC

## 2021-03-30 MED ORDER — FE FUMARATE-B12-VIT C-FA-IFC PO CAPS
1.0000 | ORAL_CAPSULE | Freq: Every day | ORAL | Status: DC
  Administered 2021-03-31 – 2021-04-08 (×9): 1 via ORAL
  Filled 2021-03-30 (×9): qty 1

## 2021-03-30 NOTE — H&P (Signed)
History and Physical    Lisa Villegas MWU:[id number] DOB: 1931-01-30 DOA: 03/30/2021  PCP: [name], MD  Patient coming from: Home.  I have personally briefly reviewed patient's old medical records in [place]  Chief Complaint: Dark stools.  HPI: Lisa Villegas is a 88 y.o. female with medical history significant of anemia, anxiety, tuberculosis, outperformed, chronic diastolic heart failure, stage IIIa chronic kidney disease, type II DM, diabetic neuropathy, thyroid nodule, hypertension, hyperlipidemia, incontinence, osteopenia, history of other non-hemorrhagic stroke who was sent from her facility due to dark stools, decreased appetite and inability to walk due to injury on her right foot.  She does not add much to her medical history.  The patient takes 2 different iron supplementation pills.  Her hemoglobin level was normal.  She is able to answer simple questions denies headache, abdominal back or chest pain.  ED Course: Initial vital signs were temperature 98.2 F, pulse 74, respirations 21, BP 214/112 mmHg and O2 sat 100% on room air.  I ordered a 1000 mL half NS bolus in the ER.  Lab work: Urinalysis showed large leukocyte esterase with small hemoglobinuria, ketonuria 20 and proteinuria more than 300 mg/dL.  CBC showed a white count 10.5, hemoglobin 13.3 g/dL platelets 317.  Troponin was 49 then 47 ng/L.  CMP showed a CO2 of 21 mmol/L, glucose 127, BUN 43 and creatinine 1.41 mg/dL.  LFTs were unremarkable, except for an albumin level of 3.4 g/dL.  Imaging: Portable 1 view chest radiograph with no acute cardiopulmonary pathology.  Right foot x-rays showed multiple metatarsal fractures.  Please see images and full radiology report for further details.  Review of Systems: As per HPI otherwise all other systems reviewed and are negative.  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[place])    Autism     Chronic CHF (congestive heart failure) ([place])    per med rec   Chronic kidney disease (CKD)    stage 3 per chart in 09/2015   Diabetes mellitus without complication (HCC)    Diabetic neuropathy (HCC)    History of thyroid nodule    nontoxic goiter per med rec   Hodgkin disease ([place])    in 1980   HTN (hypertension), benign    Hyperlipidemia    Incontinence    Lack of sensation    Osteopenia    per med rec   Poor balance    Stroke ([place])    Tubular adenoma of colon 01/2017   Vitamin D deficiency     Past Surgical History:  Procedure Laterality Date   ABDOMINAL HYSTERECTOMY     BREAST LUMPECTOMY Left     Social History  reports that she has never smoked. She has never used smokeless tobacco. She reports that she does not drink alcohol and does not use drugs.  Allergies  Allergen Reactions   Penicillins Rash    On MAR: Has patient had a PCN reaction causing immediate rash, facial/tongue/throat swelling, SOB or lightheadedness with hypotension: Yes Has patient had a PCN reaction causing severe rash involving mucus membranes or skin necrosis: Unk Has patient had a PCN reaction that required hospitalization: Unk Has patient had a PCN reaction occurring within the last 10 years: Unk If all of the above answers are "NO", then may proceed with Cephalosporin use.     Family History  Problem Relation Age of Onset  CAD Mother        died at 64 yo   Atrial fibrillation Mother        and tachycardia   CAD Father        died at 53 yo   Atrial fibrillation Father        and tachycardia   Prior to Admission medications   Medication Sig Start Date End Date Taking? Authorizing Provider  aspirin 81 MG tablet Take 1 tablet (81 mg total) by mouth daily. 07/26/17  Yes Patrecia Pour, MD  Fe Fum-FePoly-Vit C-Vit B3 (INTEGRA) 62.5-62.5-40-3 MG CAPS Take 1 capsule by mouth daily.   Yes [provider]  ferrous sulfate 325 (65 FE) MG tablet Take 325 mg by mouth daily.    Yes  [provider]  hydrALAZINE (APRESOLINE) 10 MG tablet Take 1 tablet (10 mg total) by mouth 3 (three) times daily. 08/03/17  Yes Rai, Ripudeep K, MD  hydrALAZINE (APRESOLINE) 25 MG tablet Take 25 mg by mouth 3 (three) times daily as needed (SBP 150 or greater, or DPB 85 or greater).   Yes [provider]  insulin glargine (LANTUS SOLOSTAR) 100 UNIT/ML Solostar Pen Inject 12 Units into the skin at bedtime. Patient taking differently: Inject 15 Units into the skin at bedtime. 12/06/19  Yes Dessa Phi, DO  metFORMIN (GLUCOPHAGE) 500 MG tablet Take 1 tablet (500 mg total) by mouth See admin instructions. Take 1039m in the morning and 5056mat night. Patient taking differently: Take 1,000 mg by mouth in the morning and at bedtime. 12/01/19  Yes ScGareth MorganMD  omeprazole (PRILOSEC) 40 MG capsule Take 40 mg by mouth daily.   Yes [provider]  oxybutynin (DITROPAN) 5 MG tablet Take 5 mg by mouth 2 (two) times daily. 08/25/20  Yes [provider]  Sodium Fluoride (PREVIDENT 5000 BOOSTER PLUS DT) Place 1 application onto teeth every evening.    Yes [provider]  Stannous Fluoride (SENSODYNE COMPLETE PROTECTION MT) Use as directed in the mouth or throat. Mint,place one application onto teeth daily as directed   Yes [provider]  verapamil (CALAN-SR) 120 MG CR tablet Take 120 mg by mouth at bedtime.   Yes [provider]  blood glucose meter kit and supplies Dispense based on patient and insurance preference. Use up to four times daily as directed. (FOR ICD-10 E10.9, E11.9). 12/06/19   ChDessa PhiDO    Physical Exam: Vitals:   03/30/21 1315 03/30/21 1400 03/30/21 1645 03/30/21 1700  BP: (!) 164/74 (!) 207/66 (!) 160/133 (!) 194/107  Pulse: (!) 39 73 81 80  Resp: (!) '21 19 20 ' (!) 24  Temp:      TempSrc:      SpO2: 100% 100% 98% 97%    Constitutional: NAD, calm, comfortable Eyes: PERRL, lids and conjunctivae normal ENMT:  Mucous membranes are moist. Posterior pharynx clear of any exudate or lesions. Neck: normal, supple, no masses, no thyromegaly Respiratory: clear to auscultation bilaterally, no wheezing, no crackles. Normal respiratory effort. No accessory muscle use.  Cardiovascular: Regular rate with occasional extrasystole, no murmurs / rubs / gallops. No extremity edema. 2+ pedal pulses. No carotid bruits.  Abdomen: No distention.  Soft, no tenderness, no masses palpated. No hepatosplenomegaly. Bowel sounds positive.  Musculoskeletal: Positive ecchymosis and tenderness on right foot.  Good ROM, no contractures. Normal muscle tone.  Skin: Right foot ecchymosis.  Otherwise no other acute rashes seen on limited dermatological exam. Neurologic: Seems grossly nonfocal.  Psychiatric: Unable to fully evaluate as the patient only answers some questions.  Labs on Admission: I have personally reviewed following labs and imaging studies  CBC: Recent Labs  Lab 03/30/21 1211  WBC 10.5  NEUTROABS 8.4*  HGB 13.3  HCT 41.7  MCV 85.1  PLT 588    Basic Metabolic Panel: Recent Labs  Lab 03/30/21 1211  NA 137  K 4.5  CL 108  CO2 21*  GLUCOSE 127*  BUN 43*  CREATININE 1.41*  CALCIUM 9.1    GFR: CrCl cannot be calculated (Unknown ideal weight.).  Liver Function Tests: Recent Labs  Lab 03/30/21 1211  AST 13*  ALT 12  ALKPHOS 103  BILITOT 0.9  PROT 7.4  ALBUMIN 3.4*    Urine analysis:    Component Value Date/Time   COLORURINE YELLOW 03/30/2021 1554   APPEARANCEUR CLOUDY (A) 03/30/2021 1554   APPEARANCEUR Clear 10/11/2017 1310   LABSPEC 1.015 03/30/2021 1554   PHURINE 8.0 03/30/2021 1554   GLUCOSEU NEGATIVE 03/30/2021 1554   HGBUR SMALL (A) 03/30/2021 1554   BILIRUBINUR NEGATIVE 03/30/2021 1554   BILIRUBINUR Negative 10/11/2017 1310   KETONESUR 20 (A) 03/30/2021 1554   PROTEINUR >=300 (A) 03/30/2021 1554   UROBILINOGEN negative 03/14/2016 1227   NITRITE NEGATIVE 03/30/2021 1554    LEUKOCYTESUR LARGE (A) 03/30/2021 1554    Radiological Exams on Admission: DG Chest Portable 1 View  Result Date: 03/30/2021 CLINICAL DATA:  Weakness EXAM: PORTABLE CHEST 1 VIEW COMPARISON:  12/04/2019 FINDINGS: The heart size and mediastinal contours are within normal limits. Both lungs are clear. No pleural effusion. IMPRESSION: No acute process in the chest. Electronically Signed   By: Macy Mis M.D.   On: 03/30/2021 10:20   DG Foot Complete Right  Result Date: 03/30/2021 CLINICAL DATA:  Swelling and bruising, patient has autism. EXAM: RIGHT FOOT COMPLETE - 3+ VIEW COMPARISON:  None. FINDINGS: Mildly displaced comminuted fracture of the proximal phalanx of the first digit. There are displaced fracture of the third, fourth and fifth metatarsal necks, likely a chronic process. 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 Soft tissue swelling about the dorsum of the foot. Prominent vascular calcifications. IMPRESSION: 1. Mildly displaced comminuted fracture of the proximal phalanx of the first digit. 2. Displaced fractures of the third, fourth and fifth metatarsal necks, which may be a chronic process. Correlate with prior imaging and clinical history. 3. 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 4. Soft tissue swelling about the dorsum of the foot. Electronically Signed   By: Keane Police D.O.   On: 03/30/2021 11:43    EKG: Independently reviewed. Vent. rate 85 BPM PR interval 133 ms QRS duration 93 ms QT/QTcB 406/406 ms P-R-T axes 87 71 89 Sinus rhythm Ventricular bigeminy Consider left ventricular hypertrophy  Assessment/Plan Principal Problem:   AKI (acute kidney injury) ([place])  Superimposed on  CKD (chronic kidney disease), stage III (HCC) Observation/telemetry. Continue IV fluids. Monitor intake and output. Avoid hypotension.  Avoid nephrotoxins. Follow-up renal function electrolytes. Questionable UTI on urinalysis. Begin ciprofloxacin 400 mg  IVPB every 12 hours. Follow-up urine culture and sensitivity.  Active Problems:   Fracture of unsp metatarsal bone(s), right foot, init Orthopedic surgery has been consulted. Analgesics as needed.    Type II diabetes mellitus with renal manifestations (HCC) Carbohydrate modified diet. Check hemoglobin A1c. CBG monitoring with R ISS.    Hypertension Continue hydralazine 10 mg p.o. 3 times daily. Continue hydralazine as needed. Given bigeminy and need for better BP control Will  begin metoprolol 12.5 mg p.o. twice daily.    Autism Supportive care.       DVT prophylaxis: Lovenox SQ. Code Status:   DNI/DNR. Family Communication:   Disposition Plan:   Patient is from:  SNF.  Anticipated DC to:  SNF.  Anticipated DC date:  1 to 2 days.  Anticipated DC barriers: Clinical condition. Consults called:  Orthopedic surgery. Admission status:  Observation/telemetry.  Severity of Illness: High severity in the setting of acute kidney injury and right rib fracture.   Reubin Milan MD [facility]  How to contact the [facility] Attending or Consulting provider Haivana Nakya or covering provider during after hours [place], for this patient?   Check the care team in [facility] and look for a) attending/consulting [facility] provider listed and b) the [facility] team listed Log into [email or web address] and use [place]'s universal password to access. If you do not have the password, please contact the hospital operator. Locate the [facility] provider you are looking for under [facility] and page to a number that you can be directly reached. If you still have difficulty reaching the provider, please page the [facility] (Director on Call) for the Hospitalists listed on amion for assistance.  03/30/2021, 6:13 PM   This document was prepared using Dragon voice recognition software and may contain some unintended transcription errors.

## 2021-03-30 NOTE — ED Notes (Signed)
Right foot swollen and bruised, MD made aware.

## 2021-03-30 NOTE — ED Provider Notes (Signed)
[facility] DEPT Provider Note   CSN: [id number] Arrival date & time: 03/30/21  0846     History  Chief Complaint  Patient presents with   Melena   decreased appetite    Lisa Villegas is a 88 y.o. female.  HPI Level 5 caveat due to autism. Reportedly brought in for decreased oral intake.  Sent from nursing home.  Reportedly has not been eating as much for the few days.  Patient without complaints but does have autism and really cannot provide much history.  Reportedly has had black stools.  By report both just started and has been going on for a long time.  EMS stated that her abdomen was tender but she did not say it hurt with palpation.    Home Medications Prior to Admission medications   Medication Sig Start Date End Date Taking? Authorizing Provider  aspirin 81 MG tablet Take 1 tablet (81 mg total) by mouth daily. 07/26/17   Patrecia Pour, MD  atorvastatin (LIPITOR) 20 MG tablet Take 1 tablet (20 mg total) by mouth every evening. 10/20/16   Henson, Vickie L, PA-C  atorvastatin (LIPITOR) 20 MG tablet 1 tablet    [provider]  blood glucose meter kit and supplies Dispense based on patient and insurance preference. Use up to four times daily as directed. (FOR ICD-10 E10.9, E11.9). 12/06/19   Dessa Phi, DO  calcium carbonate (TUMS EX) 750 MG chewable tablet Chew 1 tablet by mouth daily.    [provider]  cholecalciferol (VITAMIN D) 25 MCG (1000 UNIT) tablet 1 tablet    [provider]  Cholecalciferol (VITAMIN D3) 2000 UNITS TABS Take 1 tablet by mouth daily.     [provider]  Fe Fum-FePoly-Vit C-Vit B3 (INTEGRA PO) Take by mouth. 120-40-67m capsule,1cap po daily    [provider]  ferrous sulfate 325 (65 FE) MG tablet Take 325 mg by mouth daily.     [provider]  fesoterodine (TOVIAZ) 8 MG TB24 tablet TAKE 1 TAB BY MOUTH EVERY DAY.  DO NOT CRUSH OR CHEW . Patient taking  differently: Take 8 mg by mouth daily. 10/25/16   LDenita Lung MD  hydrALAZINE (APRESOLINE) 10 MG tablet Take 1 tablet (10 mg total) by mouth 3 (three) times daily. Patient taking differently: Take 10 mg by mouth in the morning and at bedtime. 08/03/17   Rai, Ripudeep KRaliegh Ip MD  hydrALAZINE (APRESOLINE) 10 MG tablet 1 tablet with food    [provider]  insulin glargine (LANTUS SOLOSTAR) 100 UNIT/ML Solostar Pen Inject 12 Units into the skin at bedtime. 12/06/19   CDessa Phi DO  magnesium oxide (MAG-OX) 400 MG tablet Take 400 mg by mouth every morning.     [provider]  magnesium oxide (MAG-OX) 400 MG tablet 1 tablet    [provider]  metFORMIN (GLUCOPHAGE) 500 MG tablet Take 1 tablet (500 mg total) by mouth See admin instructions. Take 10074min the morning and 50044mt night. Patient taking differently: Take 500-1,000 mg by mouth See admin instructions. Take 2 tabs in the morning and 1 tab at bedtime. 12/01/19   SchGareth MorganD  Multiple Vitamins-Minerals (CERTA-VITE PO) Take 1 tablet by mouth daily.     [provider]  Nutritional Supplements (GLUCERNA PO) Take 1 Can by mouth 2 (two) times daily.     [provider]  omeprazole (PRILOSEC) 20 MG capsule Take 20 mg by mouth daily.  05/01/18  [provider]  omeprazole (PRILOSEC) 40 MG capsule 1 capsule    [provider]  oxybutynin (DITROPAN) 5 MG tablet 1 tablet 08/25/20   [provider]  Sodium Fluoride (PREVIDENT 5000 BOOSTER PLUS DT) Place 1 application onto teeth every evening.     [provider]  Stannous Fluoride (SENSODYNE COMPLETE PROTECTION MT) Use as directed in the mouth or throat. Mint,place one application onto teeth daily as directed    [provider]  verapamil (CALAN-SR) 120 MG CR tablet Take 120 mg by mouth at bedtime.    [provider]      Allergies    Penicillins    Review of Systems   Review of Systems   Unable to perform ROS: Dementia   Physical Exam Updated Vital Signs BP (!) 164/74    Pulse (!) 39    Temp 98.2 F (36.8 C) (Oral)    Resp (!) 21    SpO2 100%  Physical Exam Vitals reviewed.  HENT:     Head: Atraumatic.  Eyes:     Pupils: Pupils are equal, round, and reactive to light.  Cardiovascular:     Rate and Rhythm: Regular rhythm.  Pulmonary:     Breath sounds: No wheezing or rhonchi.  Abdominal:     Comments: Some potential mild right lower quadrant fullness.  No flank tenderness.  No hernia palpated.  Musculoskeletal:        General: Tenderness present.     Comments: Ecchymosis of right great toe and forefoot.  Skin:    General: Skin is warm.     Coloration: Skin is pale.  Neurological:     Mental Status: She is alert.     Comments: Awake and pleasant but really cannot provide much history.  Follows commands.    ED Results / Procedures / Treatments   Labs (all labs ordered are listed, but only abnormal results are displayed) Labs Reviewed  COMPREHENSIVE METABOLIC PANEL - Abnormal; Notable for the following components:      Result Value   CO2 21 (*)    Glucose, Bld 127 (*)    BUN 43 (*)    Creatinine, Ser 1.41 (*)    Albumin 3.4 (*)    AST 13 (*)    GFR, Estimated 36 (*)    All other components within normal limits  CBC WITH DIFFERENTIAL/PLATELET - Abnormal; Notable for the following components:   Neutro Abs 8.4 (*)    Abs Immature Granulocytes 0.14 (*)    All other components within normal limits  TROPONIN I (HIGH SENSITIVITY) - Abnormal; Notable for the following components:   Troponin I (High Sensitivity) 49 (*)    All other components within normal limits  RESP PANEL BY RT-PCR (FLU A&B, COVID) ARPGX2  URINALYSIS, ROUTINE W REFLEX MICROSCOPIC  POC OCCULT BLOOD, ED  CBG MONITORING, ED  SAMPLE TO BLOOD BANK  TROPONIN I (HIGH SENSITIVITY)    EKG EKG Interpretation  Date/Time:  Tuesday March 30 2021 09:06:54 EST Ventricular Rate:  85 PR  Interval:  133 QRS Duration: 93 QT Interval:  406 QTC Calculation: 406 R Axis:   71 Text Interpretation: Sinus rhythm Ventricular bigeminy Consider left ventricular hypertrophy bigeminy is new Confirmed by Davonna Belling [phone]) on 03/30/2021 9:18:46 AM  Radiology DG Chest Portable 1 View  Result Date: 03/30/2021 CLINICAL DATA:  Weakness EXAM: PORTABLE CHEST 1 VIEW COMPARISON:  12/04/2019 FINDINGS: The heart size and mediastinal contours are within normal limits. Both lungs are clear.  No pleural effusion. IMPRESSION: No acute process in the chest. Electronically Signed   By: Macy Mis M.D.   On: 03/30/2021 10:20   DG Foot Complete Right  Result Date: 03/30/2021 CLINICAL DATA:  Swelling and bruising, patient has autism. EXAM: RIGHT FOOT COMPLETE - 3+ VIEW COMPARISON:  None. FINDINGS: Mildly displaced comminuted fracture of the proximal phalanx of the first digit. There are displaced fracture of the third, fourth and fifth metatarsal necks, likely a chronic process. 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 Soft tissue swelling about the dorsum of the foot. Prominent vascular calcifications. IMPRESSION: 1. Mildly displaced comminuted fracture of the proximal phalanx of the first digit. 2. Displaced fractures of the third, fourth and fifth metatarsal necks, which may be a chronic process. Correlate with prior imaging and clinical history. 3. 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 4. Soft tissue swelling about the dorsum of the foot. Electronically Signed   By: Keane Police D.O.   On: 03/30/2021 11:43    Procedures Procedures    Medications Ordered in ED Medications - No data to display  ED Course/ Medical Decision Making/ A&P                           Medical Decision Making  This patient presents to the ED for concern of mental status change fatigue decreased oral intake.  Hospitalist called, this involves an extensive number of treatment  options, and is a complaint that carries with it a high risk of complications and morbidity.  The differential diagnosis includes infection, trauma, dehydration.   Co morbidities that complicate the patient evaluation  Diabetes, autism, osteopenia.  CHF,   Additional history obtained:  Additional history obtained from previous admissions External records from outside source obtained and reviewed including patient's POA, [name]   Lab Tests:  I Ordered, and personally interpreted labs.  The pertinent results include: Creatinine mildly increased.  Troponin mildly increased.  Hemoglobin reassuring.   Imaging Studies ordered:  I ordered imaging studies including right foot. I independently visualized and interpreted imaging which showed metatarsal and great toe fractures. I agree with the radiologist interpretation   Cardiac Monitoring:  The patient was maintained on a cardiac monitor.  I personally viewed and interpreted the cardiac monitored which showed an underlying rhythm of: Initially ventricular bigeminy.  Then normal sinus rhythm.   Medicines ordered and prescription drug management:  I ordered medication including normal saline for dehydration Reevaluation of the patient after these medicines showed that the patient stayed the same I have reviewed the patients home medicines and have made adjustments as needed      Critical Interventions:     Consultations Obtained:  I requested consultation with the hospitalist,  and discussed lab and imaging findings as well as pertinent plan - they recommend: Admission   Problem List / ED Course:  Patient brought in for mental status change.  Reportedly some dark stool.  Generalized decreased oral intake and fatigue.  Patient has history of autism and really cannot provide history.  Discussed with patient's however attorney [name] she states that the patient is very smart and if you say that the question is a test  she will sometimes be able answer more. Patient initially was in ventricular bigeminy.  This is resolved without specific treatment.  Hemoglobin reassuring.  Is 13.  Higher than has been previously.  At this time occult blood and not been done.  However  creatinine mildly increased and troponin mildly increased.  Chest x-ray done and did not show pneumonia.  Negative COVID test. Difficult to get history.  With mildly low troponin and LE kidney function I feels the patient benefit from mission to the hospital.  Patient's POA is on her way will be here in a few hours.  Will discuss with hospitalist. Also has foot fractures.  Likely just immobilization for comfort and if patient would be ambulatory.  Fractures potentially subacute.    Reevaluation:  After the interventions noted above, I reevaluated the patient and found that they have :improved   Social Determinants of Health:  Nursing home resident.   Dispostion:  After consideration of the diagnostic results and the patients response to treatment, I feel that the patent would benefit from admission to hospital.         Final Clinical Impression(s) / ED Diagnoses Final diagnoses:  AKI (acute kidney injury) ([place])  Elevated troponin  Multiple closed fractures of metatarsal bone of right foot, initial encounter    Rx / DC Orders ED Discharge Orders     None         Davonna Belling, MD 03/30/21 1329

## 2021-03-30 NOTE — Consult Note (Signed)
ORTHOPAEDIC CONSULTATION  REQUESTING PHYSICIAN: Reubin Milan, MD  Chief Complaint: right foot pain  HPI: Lisa Villegas is a 88 y.o. female with autism, anemia, anxiety, tuberculosis, outperformed, chronic diastolic heart failure, stage IIIa chronic kidney disease, type II DM, diabetic neuropathy, thyroid nodule, hypertension, hyperlipidemia, incontinence, osteopenia, history of other non-hemorrhagic stroke. She presented to [facility] ED from [facility] due to decreased appetite, black stools, and inability to walk for the past few days. Orthopedics was consulted for her right foot.  Patient seen in [place] ED. Appears in no acute distress. History difficult to obtain from the patient due to autism, but she says "no" when asked about having any pain. No family member or nursing facility staff at the bedside.   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[place])    Autism    Chronic CHF (congestive heart failure) ([place])    per med rec   Chronic kidney disease (CKD)    stage 3 per chart in 09/2015   Diabetes mellitus without complication (HCC)    Diabetic neuropathy (HCC)    History of thyroid nodule    nontoxic goiter per med rec   Hodgkin disease ([place])    in 1980   HTN (hypertension), benign    Hyperlipidemia    Incontinence    Lack of sensation    Osteopenia    per med rec   Poor balance    Stroke ([place])    Tubular adenoma of colon 01/2017   Vitamin D deficiency    Past Surgical History:  Procedure Laterality Date   ABDOMINAL HYSTERECTOMY     BREAST LUMPECTOMY Left    Social History   Socioeconomic History   Marital status: Single    Spouse name: Not on file   Number of children: 0   Years of education: Not on file   Highest education level: Not on file  Occupational History   Not on file  Tobacco Use   Smoking status: Never   Smokeless tobacco: Never  Substance and Sexual  Activity   Alcohol use: No   Drug use: No   Sexual activity: Not on file  Other Topics Concern   Not on file  Social History Narrative   L[facility] at [facility], single, has a PhD in [employer]   Social Determinants of Health   Financial Resource Strain: Not on file  Food Insecurity: Not on file  Transportation Needs: Not on file  Physical Activity: Not on file  Stress: Not on file  Social Connections: Not on file   Family History  Problem Relation Age of Onset   CAD Mother        died at 31 yo   Atrial fibrillation Mother        and tachycardia   CAD Father        died at 41 yo   Atrial fibrillation Father        and tachycardia   Allergies  Allergen Reactions   Penicillins Rash    On MAR: Has patient had a PCN reaction causing immediate rash, facial/tongue/throat swelling, SOB or lightheadedness with hypotension: Yes Has patient had a PCN reaction causing severe rash involving mucus membranes or skin necrosis: Unk Has patient had a PCN reaction that required hospitalization: Unk Has patient had a PCN reaction occurring within the last 10 years: Unk If  all of the above answers are "NO", then may proceed with Cephalosporin use.    Prior to Admission medications   Medication Sig Start Date End Date Taking? Authorizing Provider  aspirin 81 MG tablet Take 1 tablet (81 mg total) by mouth daily. 07/26/17  Yes Patrecia Pour, MD  Fe Fum-FePoly-Vit C-Vit B3 (INTEGRA) 62.5-62.5-40-3 MG CAPS Take 1 capsule by mouth daily.   Yes [provider]  ferrous sulfate 325 (65 FE) MG tablet Take 325 mg by mouth daily.    Yes [provider]  hydrALAZINE (APRESOLINE) 10 MG tablet Take 1 tablet (10 mg total) by mouth 3 (three) times daily. 08/03/17  Yes Rai, Ripudeep K, MD  hydrALAZINE (APRESOLINE) 25 MG tablet Take 25 mg by mouth 3 (three) times daily as needed (SBP 150 or greater, or DPB 85 or greater).   Yes [provider]  insulin glargine (LANTUS  SOLOSTAR) 100 UNIT/ML Solostar Pen Inject 12 Units into the skin at bedtime. Patient taking differently: Inject 15 Units into the skin at bedtime. 12/06/19  Yes Dessa Phi, DO  metFORMIN (GLUCOPHAGE) 500 MG tablet Take 1 tablet (500 mg total) by mouth See admin instructions. Take 1041m in the morning and 5064mat night. Patient taking differently: Take 1,000 mg by mouth in the morning and at bedtime. 12/01/19  Yes ScGareth MorganMD  omeprazole (PRILOSEC) 40 MG capsule Take 40 mg by mouth daily.   Yes [provider]  oxybutynin (DITROPAN) 5 MG tablet Take 5 mg by mouth 2 (two) times daily. 08/25/20  Yes [provider]  Sodium Fluoride (PREVIDENT 5000 BOOSTER PLUS DT) Place 1 application onto teeth every evening.    Yes [provider]  Stannous Fluoride (SENSODYNE COMPLETE PROTECTION MT) Use as directed in the mouth or throat. Mint,place one application onto teeth daily as directed   Yes [provider]  verapamil (CALAN-SR) 120 MG CR tablet Take 120 mg by mouth at bedtime.   Yes [provider]  blood glucose meter kit and supplies Dispense based on patient and insurance preference. Use up to four times daily as directed. (FOR ICD-10 E10.9, E11.9). 12/06/19   ChDessa PhiDO   DG Chest Portable 1 View  Result Date: 03/30/2021 CLINICAL DATA:  Weakness EXAM: PORTABLE CHEST 1 VIEW COMPARISON:  12/04/2019 FINDINGS: The heart size and mediastinal contours are within normal limits. Both lungs are clear. No pleural effusion. IMPRESSION: No acute process in the chest. Electronically Signed   By: PrMacy Mis.D.   On: 03/30/2021 10:20   DG Foot Complete Right  Result Date: 03/30/2021 CLINICAL DATA:  Swelling and bruising, patient has autism. EXAM: RIGHT FOOT COMPLETE - 3+ VIEW COMPARISON:  None. FINDINGS: Mildly displaced comminuted fracture of the proximal phalanx of the first digit. There are displaced fracture of the third, fourth and fifth metatarsal  necks, likely a chronic process. 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 Soft tissue swelling about the dorsum of the foot. Prominent vascular calcifications. IMPRESSION: 1. Mildly displaced comminuted fracture of the proximal phalanx of the first digit. 2. Displaced fractures of the third, fourth and fifth metatarsal necks, which may be a chronic process. Correlate with prior imaging and clinical history. 3. 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 4. Soft tissue swelling about the dorsum of the foot. Electronically Signed   By: ImKeane Police.O.   On: 03/30/2021 11:43   Family History Reviewed and non-contributory, no pertinent history of problems with bleeding or anesthesia  Review of Systems 14 system ROS conducted and negative except for that noted in HPI   OBJECTIVE  Vitals:Patient Vitals for the past 8 hrs:  BP Temp Temp src Pulse Resp SpO2  03/30/21 1400 (!) 207/66 -- -- 73 19 100 %  03/30/21 1315 (!) 164/74 -- -- (!) 39 (!) 21 100 %  03/30/21 1245 (!) 191/78 -- -- 80 17 100 %  03/30/21 1215 (!) 181/106 -- -- 66 (!) 21 (!) 79 %  03/30/21 1130 (!) 196/106 -- -- 88 (!) 23 100 %  03/30/21 1100 (!) 179/160 -- -- 75 19 100 %  03/30/21 1045 (!) 178/87 -- -- 78 (!) 22 100 %  03/30/21 1021 (!) 174/147 -- -- 80 (!) 22 (!) 83 %  03/30/21 0931 (!) 214/112 -- -- -- (!) 21 100 %  03/30/21 0926 -- -- -- -- -- 100 %  03/30/21 0858 -- 98.2 F (36.8 C) Oral 74 -- --   General: Alert, no acute distress Cardiovascular: Warm extremities noted Respiratory: No cyanosis, no use of accessory musculature GI: No organomegaly, abdomen is soft and non-tender Skin: No lesions in the area of chief complaint other than those listed below in MSK exam.  Neurologic: Sensation difficult to access due to patient's mental status Psychiatric: Patient is competent for consent with normal mood and affect in setting of autism Lymphatic: No swelling obvious and reported  other than the area involved in the exam below  Extremities  Bilateral upper extremities: does not complain of pain or grimace with palpation of shoulder, elbow, forearm, wrist, hand. She will actively move her arms when prompted. Warm well perfused hand RLE: significant swelling and bruising of the right foot. However she does not grimace with palpation of the right foot. does not complain of pain or grimace with palpation left hip, knee, or ankle.  She tolerates ROM of hip, knee, ankle without pain. She will actively stretch out lower extremity and reposition herself. She wiggles toes slightly. Warm well perfused foot.  LLE: does not complain of pain or grimace with palpation left hip, knee, ankle, foot. She tolerates ROM of hip, knee, ankle, foot without pain. She will actively stretch out lower extremity and reposition herself. She wiggles toes slightly.  Warm well perfused foot.     Test Results Imaging X-rays of the right foot demonstrate 1st digit proximal phalanx fracture, and minimally displaced fractures of the 3rd, 4th, and 5th metatarsal necks  Labs cbc Recent Labs    03/30/21 1211  WBC 10.5  HGB 13.3  HCT 41.7  PLT 317    Labs inflam No results for input(s): CRP in the last 72 hours.  Invalid input(s): ESR  Labs coag No results for input(s): INR, PTT in the last 72 hours.  Invalid input(s): PT  Recent Labs    03/30/21 1211  NA 137  K 4.5  CL 108  CO2 21*  GLUCOSE 127*  BUN 43*  CREATININE 1.41*  CALCIUM 9.1     ASSESSMENT AND PLAN: 88 y.o. female with the following: 1st proximal phalanx fracture and 3rd, 4th, 5th metatarsal neck fractures  - Weight Bearing Status/Activity: WBAT in post-op shoe  - Additional recommendations: PT/OT to help patient mobilize safely - VTE Prophylaxis: per primary - Pain control: per primary - Follow-up plan: 1-2 weeks in office after discharge  Contact information:   Weekdays 7am-5pm:  Dr. Ophelia Charter, Noemi Chapel,  PA-C or call office for patient follow up: [phone] After hours and  holidays please check [email or web address] for group call information for [facility]   Noemi Chapel, PA-C 03/30/2020

## 2021-03-30 NOTE — ED Notes (Addendum)
Red rash noted to left AC and below IV during Cipro abx infusion. Cipro stopped. Neomia Glass, NP informed.

## 2021-03-30 NOTE — ED Notes (Signed)
Pt ate entire ham sandwich

## 2021-03-30 NOTE — ED Triage Notes (Signed)
Patient BIBA from [facility]. Per EMS patient has has a decreased appetite, black stools, and has been unable to walk for the past few days.  High on Autism Spectrum. Difficult to understand.  HX HTN  BP: 201/107 RR:40 HR: 80 Spo2: 100 RA Temp: 97.75f

## 2021-03-30 NOTE — ED Notes (Signed)
Lab to add on urine culture

## 2021-03-31 ENCOUNTER — Other Ambulatory Visit: Payer: Self-pay

## 2021-03-31 DIAGNOSIS — Z79899 Other long term (current) drug therapy: Secondary | ICD-10-CM | POA: Diagnosis not present

## 2021-03-31 DIAGNOSIS — N1831 Chronic kidney disease, stage 3a: Secondary | ICD-10-CM | POA: Diagnosis present

## 2021-03-31 DIAGNOSIS — R2681 Unsteadiness on feet: Secondary | ICD-10-CM | POA: Diagnosis not present

## 2021-03-31 DIAGNOSIS — D631 Anemia in chronic kidney disease: Secondary | ICD-10-CM | POA: Diagnosis present

## 2021-03-31 DIAGNOSIS — E785 Hyperlipidemia, unspecified: Secondary | ICD-10-CM | POA: Diagnosis present

## 2021-03-31 DIAGNOSIS — F840 Autistic disorder: Secondary | ICD-10-CM

## 2021-03-31 DIAGNOSIS — S2231XA Fracture of one rib, right side, initial encounter for closed fracture: Secondary | ICD-10-CM | POA: Diagnosis present

## 2021-03-31 DIAGNOSIS — D649 Anemia, unspecified: Secondary | ICD-10-CM | POA: Diagnosis not present

## 2021-03-31 DIAGNOSIS — Z66 Do not resuscitate: Secondary | ICD-10-CM | POA: Diagnosis present

## 2021-03-31 DIAGNOSIS — Z7982 Long term (current) use of aspirin: Secondary | ICD-10-CM | POA: Diagnosis not present

## 2021-03-31 DIAGNOSIS — Z743 Need for continuous supervision: Secondary | ICD-10-CM | POA: Diagnosis not present

## 2021-03-31 DIAGNOSIS — W19XXXA Unspecified fall, initial encounter: Secondary | ICD-10-CM | POA: Diagnosis present

## 2021-03-31 DIAGNOSIS — S92301A Fracture of unspecified metatarsal bone(s), right foot, initial encounter for closed fracture: Secondary | ICD-10-CM

## 2021-03-31 DIAGNOSIS — I10 Essential (primary) hypertension: Secondary | ICD-10-CM | POA: Diagnosis not present

## 2021-03-31 DIAGNOSIS — Z90710 Acquired absence of both cervix and uterus: Secondary | ICD-10-CM | POA: Diagnosis not present

## 2021-03-31 DIAGNOSIS — E1140 Type 2 diabetes mellitus with diabetic neuropathy, unspecified: Secondary | ICD-10-CM | POA: Diagnosis present

## 2021-03-31 DIAGNOSIS — E041 Nontoxic single thyroid nodule: Secondary | ICD-10-CM | POA: Diagnosis present

## 2021-03-31 DIAGNOSIS — I130 Hypertensive heart and chronic kidney disease with heart failure and stage 1 through stage 4 chronic kidney disease, or unspecified chronic kidney disease: Secondary | ICD-10-CM | POA: Diagnosis present

## 2021-03-31 DIAGNOSIS — Z7984 Long term (current) use of oral hypoglycemic drugs: Secondary | ICD-10-CM | POA: Diagnosis not present

## 2021-03-31 DIAGNOSIS — B9620 Unspecified Escherichia coli [E. coli] as the cause of diseases classified elsewhere: Secondary | ICD-10-CM | POA: Diagnosis present

## 2021-03-31 DIAGNOSIS — R778 Other specified abnormalities of plasma proteins: Secondary | ICD-10-CM

## 2021-03-31 DIAGNOSIS — F32A Depression, unspecified: Secondary | ICD-10-CM | POA: Diagnosis not present

## 2021-03-31 DIAGNOSIS — Z741 Need for assistance with personal care: Secondary | ICD-10-CM | POA: Diagnosis not present

## 2021-03-31 DIAGNOSIS — E1121 Type 2 diabetes mellitus with diabetic nephropathy: Secondary | ICD-10-CM | POA: Diagnosis not present

## 2021-03-31 DIAGNOSIS — Z794 Long term (current) use of insulin: Secondary | ICD-10-CM | POA: Diagnosis not present

## 2021-03-31 DIAGNOSIS — N179 Acute kidney failure, unspecified: Secondary | ICD-10-CM | POA: Diagnosis present

## 2021-03-31 DIAGNOSIS — B964 Proteus (mirabilis) (morganii) as the cause of diseases classified elsewhere: Secondary | ICD-10-CM | POA: Diagnosis present

## 2021-03-31 DIAGNOSIS — E1122 Type 2 diabetes mellitus with diabetic chronic kidney disease: Secondary | ICD-10-CM | POA: Diagnosis present

## 2021-03-31 DIAGNOSIS — R278 Other lack of coordination: Secondary | ICD-10-CM | POA: Diagnosis not present

## 2021-03-31 DIAGNOSIS — F419 Anxiety disorder, unspecified: Secondary | ICD-10-CM | POA: Diagnosis not present

## 2021-03-31 DIAGNOSIS — E1129 Type 2 diabetes mellitus with other diabetic kidney complication: Secondary | ICD-10-CM | POA: Diagnosis not present

## 2021-03-31 DIAGNOSIS — N390 Urinary tract infection, site not specified: Secondary | ICD-10-CM | POA: Diagnosis present

## 2021-03-31 DIAGNOSIS — Z1629 Resistance to other single specified antibiotic: Secondary | ICD-10-CM | POA: Diagnosis present

## 2021-03-31 DIAGNOSIS — I5032 Chronic diastolic (congestive) heart failure: Secondary | ICD-10-CM | POA: Diagnosis present

## 2021-03-31 DIAGNOSIS — I959 Hypotension, unspecified: Secondary | ICD-10-CM | POA: Diagnosis not present

## 2021-03-31 DIAGNOSIS — R1314 Dysphagia, pharyngoesophageal phase: Secondary | ICD-10-CM | POA: Diagnosis not present

## 2021-03-31 DIAGNOSIS — E119 Type 2 diabetes mellitus without complications: Secondary | ICD-10-CM | POA: Diagnosis not present

## 2021-03-31 DIAGNOSIS — E440 Moderate protein-calorie malnutrition: Secondary | ICD-10-CM | POA: Diagnosis present

## 2021-03-31 DIAGNOSIS — Z8673 Personal history of transient ischemic attack (TIA), and cerebral infarction without residual deficits: Secondary | ICD-10-CM | POA: Diagnosis not present

## 2021-03-31 DIAGNOSIS — F809 Developmental disorder of speech and language, unspecified: Secondary | ICD-10-CM | POA: Diagnosis not present

## 2021-03-31 DIAGNOSIS — F0390 Unspecified dementia without behavioral disturbance: Secondary | ICD-10-CM | POA: Diagnosis not present

## 2021-03-31 DIAGNOSIS — S92301D Fracture of unspecified metatarsal bone(s), right foot, subsequent encounter for fracture with routine healing: Secondary | ICD-10-CM | POA: Diagnosis not present

## 2021-03-31 DIAGNOSIS — R251 Tremor, unspecified: Secondary | ICD-10-CM | POA: Diagnosis not present

## 2021-03-31 DIAGNOSIS — K219 Gastro-esophageal reflux disease without esophagitis: Secondary | ICD-10-CM | POA: Diagnosis not present

## 2021-03-31 DIAGNOSIS — M6281 Muscle weakness (generalized): Secondary | ICD-10-CM | POA: Diagnosis not present

## 2021-03-31 DIAGNOSIS — Z20822 Contact with and (suspected) exposure to covid-19: Secondary | ICD-10-CM | POA: Diagnosis present

## 2021-03-31 LAB — MAGNESIUM: Magnesium: 1.4 mg/dL — ABNORMAL LOW (ref 1.7–2.4)

## 2021-03-31 LAB — BASIC METABOLIC PANEL
Anion gap: 9 (ref 5–15)
BUN: 37 mg/dL — ABNORMAL HIGH (ref 8–23)
CO2: 22 mmol/L (ref 22–32)
Calcium: 8.6 mg/dL — ABNORMAL LOW (ref 8.9–10.3)
Chloride: 103 mmol/L (ref 98–111)
Creatinine, Ser: 1.2 mg/dL — ABNORMAL HIGH (ref 0.44–1.00)
GFR, Estimated: 44 mL/min — ABNORMAL LOW (ref >60)
Glucose, Bld: 111 mg/dL — ABNORMAL HIGH (ref 70–99)
Potassium: 4 mmol/L (ref 3.5–5.1)
Sodium: 134 mmol/L — ABNORMAL LOW (ref 135–145)

## 2021-03-31 LAB — GLUCOSE, CAPILLARY
Glucose-Capillary: 177 mg/dL — ABNORMAL HIGH (ref 70–99)
Glucose-Capillary: 129 mg/dL — ABNORMAL HIGH (ref 70–99)

## 2021-03-31 LAB — CBG MONITORING, ED
Glucose-Capillary: 93 mg/dL (ref 70–99)
Glucose-Capillary: 89 mg/dL (ref 70–99)

## 2021-03-31 LAB — MRSA NEXT GEN BY PCR, NASAL: MRSA by PCR Next Gen: NOT DETECTED

## 2021-03-31 LAB — HEMOGLOBIN A1C
Hgb A1c MFr Bld: 6.7 % — ABNORMAL HIGH (ref 4.8–5.6)
Mean Plasma Glucose: 145.59 mg/dL

## 2021-03-31 MED ORDER — OXYBUTYNIN CHLORIDE 5 MG PO TABS
5.0000 mg | ORAL_TABLET | Freq: Two times a day (BID) | ORAL | Status: DC
  Administered 2021-03-31 – 2021-04-08 (×17): 5 mg via ORAL
  Filled 2021-03-31 (×17): qty 1

## 2021-03-31 MED ORDER — SODIUM CHLORIDE 0.9 % IV SOLN: 1.0000 g | Freq: Every day | INTRAVENOUS | Status: DC

## 2021-03-31 MED ORDER — HYDRALAZINE HCL 25 MG PO TABS
25.0000 mg | ORAL_TABLET | Freq: Three times a day (TID) | ORAL | Status: DC
  Administered 2021-03-31 (×3): 25 mg via ORAL
  Filled 2021-03-31 (×3): qty 1

## 2021-03-31 MED ORDER — ACETAMINOPHEN 500 MG PO TABS
500.0000 mg | ORAL_TABLET | Freq: Three times a day (TID) | ORAL | Status: DC
  Administered 2021-03-31 – 2021-04-08 (×25): 500 mg via ORAL
  Filled 2021-03-31 (×25): qty 1

## 2021-03-31 MED ORDER — MAGNESIUM SULFATE 4 GM/100ML IV SOLN
4.0000 g | Freq: Once | INTRAVENOUS | Status: AC
  Administered 2021-03-31: 4 g via INTRAVENOUS
  Filled 2021-03-31: qty 100

## 2021-03-31 MED ORDER — SODIUM CHLORIDE 0.9 % IV SOLN
1.0000 g | Freq: Every day | INTRAVENOUS | Status: DC
  Administered 2021-03-31: 1 g via INTRAVENOUS
  Filled 2021-03-31 (×2): qty 10
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
## 2021-03-31 NOTE — Progress Notes (Signed)
Delay in accepting pt d/t waiting on temp.

## 2021-03-31 NOTE — Evaluation (Addendum)
Physical Therapy Evaluation Patient Details Name: Lisa Villegas MRN: [id number] DOB: [birth year] Today's Date: 03/31/2021  History of Present Illness  Lisa Villegas is a 88 y.o. female with medical history significant of anemia, autism,anxiety, tuberculosis,  chronic diastolic heart failure, stage IIIa chronic kidney disease, type II DM, diabetic neuropathy, thyroid nodule, hypertension, hyperlipidemia, incontinence, osteopenia, history of other non-hemorrhagic stroke who was sent from her facility 03/30/21  due to dark stools, decreased appetite and inability to walk due to injury on her right foot.  X-rays of the right foot demonstrate 1st digit proximal phalanx fracture, and minimally displaced fractures of the 3rd, 4th, and 5th metatarsal necks  Clinical Impression  The patient is alert. Follows simple directions inconsistently, nonverbal except a "no". Patient resides in ALF, ambulated with Rw. Recommend return to  the facility with HHPT and assistance. Pt admitted with above diagnosis. . Pt currently with functional limitations due to the deficits listed below (see PT Problem List). Pt will benefit from skilled PT to increase their independence and safety with mobility to allow discharge to the venue listed below.          Recommendations for follow up therapy are one component of a multi-disciplinary discharge planning process, led by the attending physician.  Recommendations may be updated based on patient status, additional functional criteria and insurance authorization.  Follow Up Recommendations Home health PT    Assistance Recommended at Discharge Frequent or constant Supervision/Assistance  Patient can return home with the following  A lot of help with bathing/dressing/bathroom;Assistance with cooking/housework;Assist for transportation;Direct supervision/assist for medications management;A lot of help with walking and/or transfers;Direct supervision/assist for financial  management;Help with stairs or ramp for entrance    Equipment Recommendations None recommended by PT  Recommendations for Other Services       Functional Status Assessment Patient has had a recent decline in their functional status and demonstrates the ability to make significant improvements in function in a reasonable and predictable amount of time.     Precautions / Restrictions Precautions Precautions: Fall Precaution Comments: difficulty following diretions Required Braces or Orthoses: Splint/Cast Splint/Cast: post op shoe on right Restrictions Weight Bearing Restrictions: No RLE Weight Bearing: Weight bearing as tolerated      Mobility  Bed Mobility Overal bed mobility: Needs Assistance Bed Mobility: Rolling Rolling: Max assist;+2 for physical assistance;+2 for safety/equipment         General bed mobility comments: patient tending to push against rails when attempoting to roll. ? patientundestans direction. did not atempt sitting due to resistance. HOB placed upright, patient able to eat  sandwich and drink    Transfers                        Ambulation/Gait                  Stairs            Wheelchair Mobility    Modified Rankin (Stroke Patients Only)       Balance                                             Pertinent Vitals/Pain Breathing: normal Negative Vocalization: none Facial Expression: smiling or inexpressive Body Language: tense, distressed pacing, fidgeting Consolability: no need to console PAINAD Score: 1    Home Living Family/patient expects to  be discharged to:: Assisted living     Type of Home: Assisted living                  Prior Function Prior Level of Function : Patient poor historian/Family not available             Mobility Comments: unsure, notes indicate ambulatory with RW       Hand Dominance        Extremity/Trunk Assessment        Lower Extremity  Assessment Lower Extremity Assessment: RLE deficits/detail;LLE deficits/detail RLE Deficits / Details: bruising  on right foot, post op shoe in place, able to kift leg from bed LLE Deficits / Details: raises leg, perform s "bedpan " lift       Communication   Communication: Expressive difficulties (non verbal except "No')  Cognition Arousal/Alertness: Awake/alert Behavior During Therapy: Flat affect Overall Cognitive Status: History of cognitive impairments - at baseline  Patient did allow staff to assist with hygiene and changing briefs,  staff able  to remove soiled clothes and place hospital gown on patient..                               General Comments: most likely at baseline, able to follow some  simple cues, able to take sandwich and eat and drink from cup, folloed direction to lift buttocks up to place pad        General Comments General comments (skin integrity, edema, etc.): unable to test, sits upright in bed with back supported, no listing noted.    Exercises     Assessment/Plan    PT Assessment Patient needs continued PT services  PT Problem List Decreased strength;Decreased mobility;Decreased safety awareness;Decreased knowledge of precautions;Decreased activity tolerance;Decreased cognition;Decreased knowledge of use of DME       PT Treatment Interventions DME instruction;Therapeutic activities;Cognitive remediation;Gait training;Therapeutic exercise;Patient/family education;Functional mobility training    PT Goals (Current goals can be found in the Care Plan section)  Acute Rehab PT Goals PT Goal Formulation: Patient unable to participate in goal setting Time For Goal Achievement: 04/14/21 Potential to Achieve Goals: Fair    Frequency Min 2X/week     Co-evaluation               AM-PAC PT "6 Clicks" Mobility  Outcome Measure Help needed turning from your back to your side while in a flat bed without using bedrails?: A Lot Help  needed moving from lying on your back to sitting on the side of a flat bed without using bedrails?: Total Help needed moving to and from a bed to a chair (including a wheelchair)?: Total Help needed standing up from a chair using your arms (e.g., wheelchair or bedside chair)?: Total Help needed to walk in hospital room?: Total Help needed climbing 3-5 steps with a railing? : Total 6 Click Score: 7    End of Session   Activity Tolerance: Patient tolerated treatment well Patient left: in bed;with call bell/phone within reach;with bed alarm set Nurse Communication: Mobility status PT Visit Diagnosis: Unsteadiness on feet (R26.81);History of falling (Z91.81)    Time: 6629-4765 PT Time Calculation (min) (ACUTE ONLY): 25 min   Charges:   PT Evaluation $PT Eval Low Complexity: [facility] Acute Rehabilitation Services Pager [phone] Office [phone]   Claretha Cooper 03/31/2021, 3:32 PM

## 2021-03-31 NOTE — ED Notes (Signed)
Magnesium to be added on to 5am labs that were just sent.

## 2021-03-31 NOTE — Evaluation (Signed)
Occupational Therapy Evaluation Patient Details Name: Lisa Villegas MRN: [id number] DOB: Apr 25, 1930 Today's Date: 03/31/2021   History of Present Illness Lisa Villegas is a 88 y.o. female with medical history significant of anemia, autism,anxiety, tuberculosis,  chronic diastolic heart failure, stage IIIa chronic kidney disease, type II DM, diabetic neuropathy, thyroid nodule, hypertension, hyperlipidemia, incontinence, osteopenia, history of other non-hemorrhagic stroke who was sent from her facility 03/30/21  due to dark stools, decreased appetite and inability to walk due to injury on her right foot.  X-rays of the right foot demonstrate 1st digit proximal phalanx fracture, and minimally displaced fractures of the 3rd, 4th, and 5th metatarsal necks   Clinical Impression   Lisa Villegas is a 88 year old autistic woman who is from an assisted living. Patient unable to answer PLOF questions but from prior notes patient ambulates with RW and has a sedentary lifestyle. Assume that patient has assistance for all ADLs and has a Depends on. Evaluation limited by patient's difficulty following commands - she was inconsistent - and somewhat resistant with attempts to transfer in to sitting. Patient found to be soaked through with urine from thighs to mid back and patient assisted with doffing clothing, washing and applying a clean gown. Patient's verbalizations limited to "yes" or "no." Some ability to assist with rolling and lifting butt up off of bed. Due to autism therapist recommends return to familiar environment and faces for more improved participation with therapy if facility can provide physical assistance. Patient will benefit from skilled OT services while in hospital to improve deficits in order to return to PLOF and reduce caregiver burden.     Recommendations for follow up therapy are one component of a multi-disciplinary discharge planning process, led by the attending physician.   Recommendations may be updated based on patient status, additional functional criteria and insurance authorization.   Follow Up Recommendations  Home health OT    Assistance Recommended at Discharge Frequent or constant Supervision/Assistance  Patient can return home with the following A lot of help with walking and/or transfers;A lot of help with bathing/dressing/bathroom    Functional Status Assessment  Patient has had a recent decline in their functional status and demonstrates the ability to make significant improvements in function in a reasonable and predictable amount of time.  Equipment Recommendations  None recommended by OT    Recommendations for Other Services       Precautions / Restrictions Precautions Precautions: Fall Precaution Comments: Autistic? Difficulty following directions Required Braces or Orthoses: Splint/Cast Splint/Cast: post op shoe on right Splint/Cast - Date Prophylactic Dressing Applied (if applicable): 77/93/90 Restrictions Weight Bearing Restrictions: No RLE Weight Bearing: Weight bearing as tolerated      Mobility Bed Mobility Overal bed mobility: Needs Assistance Bed Mobility: Rolling Rolling: Max assist;+2 for physical assistance;+2 for safety/equipment         General bed mobility comments: patient tending to push against rails when attempoting to roll. Able to follow some directions but not others. Did not atempt sitting due to resistance. HOB placed upright, patient able to eat sandwich and drink in upright position.            ADL either performed or assessed with clinical judgement   ADL Overall ADL's : Needs assistance/impaired Eating/Feeding: Set up;Bed level   Grooming: Set up;Bed level   Upper Body Bathing: Maximal assistance;Bed level   Lower Body Bathing: Total assistance;Bed level   Upper Body Dressing : Maximal assistance;Bed level   Lower Body  Dressing: Total assistance;Bed level     Toilet Transfer  Details (indicate cue type and reason): unable Toileting- Clothing Manipulation and Hygiene: Total assistance Toileting - Clothing Manipulation Details (indicate cue type and reason): incontinent - found in diaper             Vision   Vision Assessment?: No apparent visual deficits     Perception     Praxis      Pertinent Vitals/Pain Pain Assessment: Faces Faces Pain Scale: No hurt Breathing: normal Negative Vocalization: none Facial Expression: smiling or inexpressive Body Language: tense, distressed pacing, fidgeting Consolability: no need to console PAINAD Score: 1     Hand Dominance Right   Extremity/Trunk Assessment Upper Extremity Assessment Upper Extremity Assessment: Difficult to assess due to impaired cognition;Overall [facility] for tasks assessed (grossly functional ROM and strength though unable to assess)   Lower Extremity Assessment Lower Extremity Assessment: Defer to PT evaluation RLE Deficits / Details: bruising  on right foot, post op shoe in place, able to kift leg from bed LLE Deficits / Details: raises leg, perform s "bedpan " lift       Communication Communication Communication: Expressive difficulties (non verbal except "No')   Cognition Arousal/Alertness: Awake/alert Behavior During Therapy: Flat affect Overall Cognitive Status: History of cognitive impairments - at baseline                                 General Comments: most likely at baseline, able to follow some  simple cues, able to take sandwich and eat and drink from cup, folloed direction to lift buttocks up to place pad. Able to follow 1 step commands inconsistently.     General Comments  unable to test, sits upright in bed with back supported, no listing noted.    Exercises     Shoulder Instructions      Home Living Family/patient expects to be discharged to:: Assisted living     Type of Home: Assisted living                           Additional  Comments: From [facility].      Prior Functioning/Environment Prior Level of Function : Patient poor historian/Family not available             Mobility Comments: unsure, notes indicate ambulatory with RW ADLs Comments: assume assistance required        OT Problem List: Decreased activity tolerance;Impaired balance (sitting and/or standing);Decreased safety awareness;Decreased cognition;Decreased knowledge of use of DME or AE;Decreased knowledge of precautions      OT Treatment/Interventions: Self-care/ADL training;Therapeutic exercise;DME and/or AE instruction;Therapeutic activities;Balance training;Patient/family education;Cognitive remediation/compensation    OT Goals(Current goals can be found in the care plan section) Acute Rehab OT Goals OT Goal Formulation: Patient unable to participate in goal setting Time For Goal Achievement: 04/14/21 Potential to Achieve Goals: Fair  OT Frequency: Min 2X/week    Co-evaluation PT/OT/SLP Co-Evaluation/Treatment: Yes (co eval) Reason for Co-Treatment: Necessary to address cognition/behavior during functional activity;For patient/therapist safety          AM-PAC OT "6 Clicks" Daily Activity     Outcome Measure Help from another person eating meals?: A Little Help from another person taking care of personal grooming?: A Little Help from another person toileting, which includes using toliet, bedpan, or urinal?: Total Help from another person bathing (including washing, rinsing, drying)?: A Lot Help  from another person to put on and taking off regular upper body clothing?: A Lot Help from another person to put on and taking off regular lower body clothing?: Total 6 Click Score: 12   End of Session Nurse Communication: Mobility status  Activity Tolerance: Patient tolerated treatment well Patient left: in bed;with call bell/phone within reach;with bed alarm set  OT Visit Diagnosis: Other symptoms and signs involving cognitive  function;History of falling (Z91.81)                Time: 1450-1515 OT Time Calculation (min): 25 min Charges:  OT General Charges $OT Visit: 1 Visit OT Evaluation $OT Eval Low Complexity: 1 Low  Kathy Wahid, OTR/L [place]  Office [phone] Pager: [place] 03/31/2021, 4:20 PM

## 2021-03-31 NOTE — Progress Notes (Addendum)
PROGRESS NOTE    Lisa Villegas  KXF:[id number] DOB: [birth year] DOA: 03/30/2021 PCP: [name], MD    Chief Complaint  Patient presents with   Melena   decreased appetite    Brief Narrative:  Patient is a 88 year old female history of anemia, anxiety, chronic diastolic CHF, stage IIIa CKD, type 2 diabetes, diabetic neuropathy, thyroid nodule, osteopenia, history of nonhemorrhagic CVA sent from facility secondary to dark stools, decreased appetite, inability to walk due to injury to the right foot.  Patient noted to be taking 2 different iron supplementation pills.  Hemoglobin normal.  Work-up in the ED with urinalysis concerning for UTI.  Troponin is flattened but elevated at 49 and 47.  Chest x-ray unremarkable.  Plain films of the right foot with multiple metatarsal fractures.  Orthopedics consulted.  Patient placed on empiric IV antibiotics.   Assessment & Plan:   Principal Problem:   AKI (acute kidney injury) ([place]) Active Problems:   Type II diabetes mellitus with renal manifestations ([place])   Hypertension   Autism   HTN (hypertension), benign   CKD (chronic kidney disease), stage III ([facility])   Fracture of unsp metatarsal bone(s), right foot, init  #1 acute kidney injury on ?CKD stage IIIa -Likely secondary to prerenal azotemia due to poor oral intake. -Urinalysis done concerning for UTI. -Renal function improving with hydration. -Continue IV fluids, supportive care.  2.  Hypomagnesemia -Magnesium at 1.4. -Magnesium sulfate 4 g IV x1. -Repeat labs in AM.  3.  Fracture of metatarsal bone, right foot -Patient seen in consultation by orthopedics who are recommending postop shoe, WBAT. -PT/OT. -Outpatient follow-up.  4.  Hypertension -Blood pressure elevated. -Increase hydralazine 25 mg p.o. 3 times daily. -Continue verapamil, Lopressor.   5.  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 -CBG of 93 this morning -Continue  Semglee 15 units daily, SSI.  6.  UTI -Urine cultures pending. -Patient received IV ciprofloxacin and per pharmacy developed a rash and as such we will change to IV Rocephin.  DVT prophylaxis: SCDs Code Status: DNR Family Communication: No family at bedside. Disposition:   Status is: Inpatient  The patient will require care spanning > 2 midnights and should be moved to inpatient because: Severity of illness      Consultants:  Orthopedics: Noemi Chapel, PA 03/30/2021  Procedures:  Plain films of the right foot 03/30/2021 Chest x-ray 03/30/2021   Antimicrobials:  IV Rocephin 03/31/2021>>>> IV ciprofloxacin 03/30/2021 x 1 dose (per pharmacy patient developed rash)   Subjective: Laying in bed, puts covers over his sheets once lights are turned on.  Objective: Vitals:   03/31/21 0700 03/31/21 0830 03/31/21 0930 03/31/21 1000  BP: (!) 185/75 (!) 178/75 (!) 198/69 (!) 201/63  Pulse: 63 64 65 65  Resp: 14 20 20 12   Temp:      TempSrc:      SpO2: 99% 100% 98% 94%    Intake/Output Summary (Last 24 hours) at 03/31/2021 1147 Last data filed at 03/30/2021 2049 Gross per 24 hour  Intake 1000 ml  Output --  Net 1000 ml   There were no vitals filed for this visit.  Examination:  General exam: Appears calm and comfortable  Respiratory system: Clear to auscultation anterior lung fields. Respiratory effort normal. Cardiovascular system: S1 & S2 heard, RRR. No JVD, murmurs, rubs, gallops or clicks. No pedal edema. Gastrointestinal system: Abdomen is nondistended, soft and nontender. No organomegaly or masses felt. Normal bowel sounds heard. Central nervous system:  Alert and oriented. No focal neurological deficits. Extremities: Right foot in postop shoe. Skin: No rashes, lesions or ulcers Psychiatry: Judgement and insight unable to assess.. Mood & affect appropriate.     Data Reviewed: I have personally reviewed following labs and imaging studies  CBC: Recent Labs  Lab  03/30/21 1211  WBC 10.5  NEUTROABS 8.4*  HGB 13.3  HCT 41.7  MCV 85.1  PLT 638    Basic Metabolic Panel: Recent Labs  Lab 03/30/21 1211 03/31/21 0517  NA 137 134*  K 4.5 4.0  CL 108 103  CO2 21* 22  GLUCOSE 127* 111*  BUN 43* 37*  CREATININE 1.41* 1.20*  CALCIUM 9.1 8.6*  MG  --  1.4*    GFR: CrCl cannot be calculated (Unknown ideal weight.).  Liver Function Tests: Recent Labs  Lab 03/30/21 1211  AST 13*  ALT 12  ALKPHOS 103  BILITOT 0.9  PROT 7.4  ALBUMIN 3.4*    CBG: Recent Labs  Lab 03/30/21 1333 03/30/21 1704 03/30/21 2132 03/31/21 0810  GLUCAP 117* 112* 107* 93     Recent Results (from the past 240 hour(s))  Resp Panel by RT-PCR (Flu A&B, Covid) Nasopharyngeal Swab     Status: None   Collection Time: 03/30/21 11:00 AM   Specimen: Nasopharyngeal Swab; Nasopharyngeal(NP) swabs in vial transport medium  Result Value Ref Range Status   SARS Coronavirus 2 by RT PCR NEGATIVE NEGATIVE Final    Comment: (NOTE) SARS-CoV-2 target nucleic acids are NOT DETECTED.  The SARS-CoV-2 RNA is generally detectable in upper respiratory specimens during the acute phase of infection. The lowest concentration of SARS-CoV-2 viral copies this assay can detect is 138 copies/mL. A negative result does not preclude SARS-Cov-2 infection and should not be used as the sole basis for treatment or other patient management decisions. A negative result may occur with  improper specimen collection/handling, submission of specimen other than nasopharyngeal swab, presence of viral mutation(s) within the areas targeted by this assay, and inadequate number of viral copies(<138 copies/mL). A negative result must be combined with clinical observations, patient history, and epidemiological information. The expected result is Negative.  Fact Sheet for Patients:  EntrepreneurPulse.com.au  Fact Sheet for Healthcare Providers:   IncredibleEmployment.be  This test is no t yet approved or cleared by the [place] FDA and  has been authorized for detection and/or diagnosis of SARS-CoV-2 by FDA under an Emergency Use Authorization (EUA). This EUA will remain  in effect (meaning this test can be used) for the duration of the COVID-19 declaration under Section 564(b)(1) of the Act, 21 U.S.C.section 360bbb-3(b)(1), unless the authorization is terminated  or revoked sooner.       Influenza A by PCR NEGATIVE NEGATIVE Final   Influenza B by PCR NEGATIVE NEGATIVE Final    Comment: (NOTE) The Xpert Xpress SARS-CoV-2/FLU/RSV plus assay is intended as an aid in the diagnosis of influenza from Nasopharyngeal swab specimens and should not be used as a sole basis for treatment. Nasal washings and aspirates are unacceptable for Xpert Xpress SARS-CoV-2/FLU/RSV testing.  Fact Sheet for Patients: EntrepreneurPulse.com.au  Fact Sheet for Healthcare Providers: IncredibleEmployment.be  This test is not yet approved or cleared by the [place] FDA and has been authorized for detection and/or diagnosis of SARS-CoV-2 by FDA under an Emergency Use Authorization (EUA). This EUA will remain in effect (meaning this test can be used) for the duration of the COVID-19 declaration under Section 564(b)(1) of the Act, 21 U.S.C. section 360bbb-3(b)(1), unless  the authorization is terminated or revoked.  Performed at [facility], [place] [address] [place], [place] [address]          Radiology Studies: DG Chest Portable 1 View  Result Date: 03/30/2021 CLINICAL DATA:  Weakness EXAM: PORTABLE CHEST 1 VIEW COMPARISON:  12/04/2019 FINDINGS: The heart size and mediastinal contours are within normal limits. Both lungs are clear. No pleural effusion. IMPRESSION: No acute process in the chest. Electronically Signed   By: Macy Mis M.D.   On: 03/30/2021 10:20    DG Foot Complete Right  Result Date: 03/30/2021 CLINICAL DATA:  Swelling and bruising, patient has autism. EXAM: RIGHT FOOT COMPLETE - 3+ VIEW COMPARISON:  None. FINDINGS: Mildly displaced comminuted fracture of the proximal phalanx of the first digit. There are displaced fracture of the third, fourth and fifth metatarsal necks, likely a chronic process. 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 Soft tissue swelling about the dorsum of the foot. Prominent vascular calcifications. IMPRESSION: 1. Mildly displaced comminuted fracture of the proximal phalanx of the first digit. 2. Displaced fractures of the third, fourth and fifth metatarsal necks, which may be a chronic process. Correlate with prior imaging and clinical history. 3. 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 4. Soft tissue swelling about the dorsum of the foot. Electronically Signed   By: Keane Police D.O.   On: 03/30/2021 11:43        Scheduled Meds:  acetaminophen  500 mg Oral TID   aspirin  81 mg Oral Daily   ferrous [id number] C-folic acid  1 capsule Oral QPC breakfast   hydrALAZINE  25 mg Oral TID   insulin aspart  0-9 Units Subcutaneous TID WC   insulin glargine-yfgn  15 Units Subcutaneous QHS   metoprolol tartrate  12.5 mg Oral BID   oxybutynin  5 mg Oral BID   pantoprazole  40 mg Oral Daily   verapamil  120 mg Oral QHS   Continuous Infusions:  sodium chloride 100 mL/hr at 03/31/21 0658   ciprofloxacin Stopped (03/30/21 1915)   magnesium sulfate bolus IVPB 4 g (03/31/21 1007)     LOS: 0 days    Time spent: 35 minutes    Irine Seal, MD [facility]   To contact the attending provider between 7A-7P or the covering provider during after hours 7P-7A, please log into the web site [email or web address] and access using universal [place] password for that web site. If you do not have the password, please call the hospital operator.  03/31/2021, 11:47 AM

## 2021-04-01 ENCOUNTER — Encounter (HOSPITAL_COMMUNITY): Payer: Self-pay | Admitting: Internal Medicine

## 2021-04-01 DIAGNOSIS — N1831 Chronic kidney disease, stage 3a: Secondary | ICD-10-CM | POA: Diagnosis not present

## 2021-04-01 DIAGNOSIS — N179 Acute kidney failure, unspecified: Secondary | ICD-10-CM | POA: Diagnosis not present

## 2021-04-01 DIAGNOSIS — F840 Autistic disorder: Secondary | ICD-10-CM | POA: Diagnosis not present

## 2021-04-01 DIAGNOSIS — R778 Other specified abnormalities of plasma proteins: Secondary | ICD-10-CM | POA: Diagnosis not present

## 2021-04-01 LAB — CBC WITH DIFFERENTIAL/PLATELET
Abs Immature Granulocytes: 0.18 10*3/uL — ABNORMAL HIGH (ref 0.00–0.07)
Basophils Absolute: 0 10*3/uL (ref 0.0–0.1)
Basophils Relative: 0 %
Eosinophils Absolute: 0.3 10*3/uL (ref 0.0–0.5)
Eosinophils Relative: 4 %
HCT: 33.1 % — ABNORMAL LOW (ref 36.0–46.0)
Hemoglobin: 10.7 g/dL — ABNORMAL LOW (ref 12.0–15.0)
Immature Granulocytes: 2 %
Lymphocytes Relative: 19 %
Lymphs Abs: 1.7 10*3/uL (ref 0.7–4.0)
MCH: 27.4 pg (ref 26.0–34.0)
MCHC: 32.3 g/dL (ref 30.0–36.0)
MCV: 84.7 fL (ref 80.0–100.0)
Monocytes Absolute: 0.5 10*3/uL (ref 0.1–1.0)
Monocytes Relative: 5 %
Neutro Abs: 6.3 10*3/uL (ref 1.7–7.7)
Neutrophils Relative %: 70 %
Platelets: 244 10*3/uL (ref 150–400)
RBC: 3.91 MIL/uL (ref 3.87–5.11)
RDW: 14.7 % (ref 11.5–15.5)
WBC: 9 10*3/uL (ref 4.0–10.5)
nRBC: 0 % (ref 0.0–0.2)

## 2021-04-01 LAB — BASIC METABOLIC PANEL
Anion gap: 9 (ref 5–15)
BUN: 29 mg/dL — ABNORMAL HIGH (ref 8–23)
CO2: 20 mmol/L — ABNORMAL LOW (ref 22–32)
Calcium: 8.3 mg/dL — ABNORMAL LOW (ref 8.9–10.3)
Chloride: 102 mmol/L (ref 98–111)
Creatinine, Ser: 1.19 mg/dL — ABNORMAL HIGH (ref 0.44–1.00)
GFR, Estimated: 44 mL/min — ABNORMAL LOW (ref >60)
Glucose, Bld: 102 mg/dL — ABNORMAL HIGH (ref 70–99)
Potassium: 3.8 mmol/L (ref 3.5–5.1)
Sodium: 131 mmol/L — ABNORMAL LOW (ref 135–145)

## 2021-04-01 LAB — GLUCOSE, CAPILLARY
Glucose-Capillary: 82 mg/dL (ref 70–99)
Glucose-Capillary: 110 mg/dL — ABNORMAL HIGH (ref 70–99)
Glucose-Capillary: 94 mg/dL (ref 70–99)
Glucose-Capillary: 119 mg/dL — ABNORMAL HIGH (ref 70–99)

## 2021-04-01 LAB — MAGNESIUM: Magnesium: 2.2 mg/dL (ref 1.7–2.4)

## 2021-04-01 MED ORDER — SODIUM CHLORIDE 0.9 % IV SOLN: INTRAVENOUS | Status: DC

## 2021-04-01 MED ORDER — SODIUM CHLORIDE 0.9 % IV SOLN
2.0000 g | INTRAVENOUS | Status: DC
  Administered 2021-04-01: 2 g via INTRAVENOUS
  Filled 2021-04-01: qty 20

## 2021-04-01 MED ORDER — HYDRALAZINE HCL 50 MG PO TABS
50.0000 mg | ORAL_TABLET | Freq: Three times a day (TID) | ORAL | Status: DC
  Administered 2021-04-01 – 2021-04-08 (×22): 50 mg via ORAL
  Filled 2021-04-01 (×23): qty 1

## 2021-04-01 NOTE — Progress Notes (Signed)
Initial Nutrition Assessment  DOCUMENTATION CODES:   Non-severe (moderate) malnutrition in context of acute illness/injury  INTERVENTION:   -Monitor intakes and encourage PO  NUTRITION DIAGNOSIS:   Moderate Malnutrition related to acute illness as evidenced by mild fat depletion, moderate muscle depletion.  GOAL:   Patient will meet greater than or equal to 90% of their needs  MONITOR:   PO intake, Weight trends, Labs, I & O's  REASON FOR ASSESSMENT:   Malnutrition Screening Tool    ASSESSMENT:   88 year old female history of anemia, anxiety, chronic diastolic CHF, stage IIIa CKD, type 2 diabetes, diabetic neuropathy, thyroid nodule, osteopenia, history of nonhemorrhagic CVA sent from facility secondary to dark stools, decreased appetite, inability to walk due to injury to the right foot  Patient in room, not a great historian. Unable to answer a lot of RD's questions. NT entered room later and reports pt ate 100% of her breakfast and ate very well. When I asked pt about Ensure or Boost she just shakes her head. Will not order supplement given good PO but if changes can always place order.  Per weight records, weight is stable.   Medications: Foltrin (iron, Vit C, B-12)  Labs reviewed:  CBGs: 82-177 Low Na  NUTRITION - FOCUSED PHYSICAL EXAM:  Flowsheet Row Most Recent Value  Orbital Region Mild depletion  Upper Arm Region Mild depletion  Thoracic and Lumbar Region Unable to assess  Buccal Region Mild depletion  Temple Region Mild depletion  Clavicle Bone Region Moderate depletion  Clavicle and Acromion Bone Region Moderate depletion  Scapular Bone Region Moderate depletion  Dorsal Hand Moderate depletion  Patellar Region No depletion  Anterior Thigh Region No depletion  Posterior Calf Region Mild depletion  Edema (RD Assessment) None  Hair Reviewed  Eyes Reviewed  Mouth Reviewed  Skin Reviewed       Diet Order:   Diet Order             Diet Carb  Modified Fluid consistency: Thin; Room service appropriate? Yes  Diet effective now                   EDUCATION NEEDS:   No education needs have been identified at this time  Skin:  Skin Assessment: Reviewed RN Assessment  Last BM:  PTA  Height:   Ht Readings from Last 1 Encounters:  04/01/21 5\' 5"  (1.651 m)    Weight:   Wt Readings from Last 1 Encounters:  04/01/21 58.4 kg    BMI:  Body mass index is 21.42 kg/m.  Estimated Nutritional Needs:   Kcal:  1500-1700  Protein:  70-80g  Fluid:  1.6L/day   Clayton Bibles, MS, RD, LDN Inpatient Clinical Dietitian Contact information available via Amion

## 2021-04-01 NOTE — Progress Notes (Addendum)
PROGRESS NOTE    Lisa Villegas  ZJQ:[id number] DOB: [birth year] DOA: 03/30/2021 PCP: [name], MD    Chief Complaint  Patient presents with   Melena   decreased appetite    Brief Narrative:  Patient is a 88 year old female history of anemia, anxiety, chronic diastolic CHF, stage IIIa CKD, type 2 diabetes, diabetic neuropathy, thyroid nodule, osteopenia, history of nonhemorrhagic CVA sent from facility secondary to dark stools, decreased appetite, inability to walk due to injury to the right foot.  Patient noted to be taking 2 different iron supplementation pills.  Hemoglobin normal.  Work-up in the ED with urinalysis concerning for UTI.  Troponin is flattened but elevated at 49 and 47.  Chest x-ray unremarkable.  Plain films of the right foot with multiple metatarsal fractures.  Orthopedics consulted.  Patient placed on empiric IV antibiotics.   Assessment & Plan:   Principal Problem:   AKI (acute kidney injury) ([place]) Active Problems:   Type II diabetes mellitus with renal manifestations ([place])   Hypertension   Autism   HTN (hypertension), benign   CKD (chronic kidney disease), stage III ([facility])   Fracture of unsp metatarsal bone(s), right foot, init  1 acute kidney injury on ?CKD stage IIIa -Likely secondary to prerenal azotemia due to poor oral intake. -Urinalysis done concerning for UTI. -Renal function improved with hydration.   -IV fluids, supportive care.   2.  Hypomagnesemia -Repleted.  Magnesium at 2.2.   -Follow.    3.  Fracture of metatarsal bone, right foot -Patient seen in consultation by orthopedics who are recommending postop shoe, WBAT. -PT/OT has assessed patient and recommended home health therapies. -Outpatient follow-up.  4.  Hypertension -Blood pressure elevated. -Increase hydralazine 50 mg p.o. 3 times daily. -Continue verapamil, Lopressor.   5.  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 -CBG of 82 this  morning -Continue Semglee 15 units daily, SSI.  6.  Proteus Mirabilis UTI -Urine cultures with > 100,000 colonies of Proteus Mirabilis, sensitivities pending. -Patient received IV ciprofloxacin and per pharmacy developed a rash and as such we will change to IV Rocephin.  7.  Anemia -Likely dilutional. -Patient with no overt bleeding. -Check an anemia panel. -Follow H&H. -Transfusion threshold hemoglobin < 7.  DVT prophylaxis: SCDs Code Status: DNR Family Communication: No family at bedside. Disposition:   Status is: Inpatient  The patient will require care spanning > 2 midnights and should be moved to inpatient because: Severity of illness      Consultants:  Orthopedics: Noemi Chapel, PA 03/30/2021  Procedures:  Plain films of the right foot 03/30/2021 Chest x-ray 03/30/2021   Antimicrobials:  IV Rocephin 03/31/2021>>>> IV ciprofloxacin 03/30/2021 x 1 dose (per pharmacy patient developed rash)   Subjective: Patient alert, sitting up in bed, eating soup and had lunch.  Denies any chest pain.  No shortness of breath.  No abdominal pain.  Overall she states she is feeling better  Objective: Vitals:   03/31/21 2218 04/01/21 0012 04/01/21 0415 04/01/21 1056  BP: (!) 158/60 (!) 159/68 (!) 171/62   Pulse: 66 (!) 59 (!) 55   Resp:  18 (!) 22   Temp:  (!) 97.5 F (36.4 C) (!) 97.5 F (36.4 C)   TempSrc:  Oral Oral   SpO2:  98% 97%   Weight:    58.4 kg  Height:    5\' 5"  (1.651 m)    Intake/Output Summary (Last 24 hours) at 04/01/2021 1206 Last data filed at  03/31/2021 2300 Gross per 24 hour  Intake 2311.77 ml  Output --  Net 2311.77 ml    Filed Weights   04/01/21 1056  Weight: 58.4 kg    Examination:  General exam: NAD. Respiratory system: CTA B.  No wheezes, no crackles, no rhonchi.  Normal respiratory effort.  Speaking in full sentences.  Cardiovascular system: Regular rate and rhythm no murmurs rubs or gallops.  No JVD.  No lower extremity edema.   Gastrointestinal system: Abdomen is soft, nontender, nondistended, positive bowel sounds.  No rebound.  No guarding.  Central nervous system: Alert and oriented. No focal neurological deficits. Extremities: Right foot in postop shoe. Skin: No rashes, lesions or ulcers Psychiatry: Judgement and insight unable poor to fair. Mood & affect appropriate.     Data Reviewed: I have personally reviewed following labs and imaging studies  CBC: Recent Labs  Lab 03/30/21 1211 04/01/21 0553  WBC 10.5 9.0  NEUTROABS 8.4* 6.3  HGB 13.3 10.7*  HCT 41.7 33.1*  MCV 85.1 84.7  PLT 317 244     Basic Metabolic Panel: Recent Labs  Lab 03/30/21 1211 03/31/21 0517 04/01/21 0553  NA 137 134* 131*  K 4.5 4.0 3.8  CL 108 103 102  CO2 21* 22 20*  GLUCOSE 127* 111* 102*  BUN 43* 37* 29*  CREATININE 1.41* 1.20* 1.19*  CALCIUM 9.1 8.6* 8.3*  MG  --  1.4* 2.2     GFR: Estimated Creatinine Clearance: 29.4 mL/min (A) (by C-G formula based on SCr of 1.19 mg/dL (H)).  Liver Function Tests: Recent Labs  Lab 03/30/21 1211  AST 13*  ALT 12  ALKPHOS 103  BILITOT 0.9  PROT 7.4  ALBUMIN 3.4*     CBG: Recent Labs  Lab 03/31/21 1206 03/31/21 1714 03/31/21 2121 04/01/21 0821 04/01/21 1140  GLUCAP 89 177* 129* 82 110*      Recent Results (from the past 240 hour(s))  Resp Panel by RT-PCR (Flu A&B, Covid) Nasopharyngeal Swab     Status: None   Collection Time: 03/30/21 11:00 AM   Specimen: Nasopharyngeal Swab; Nasopharyngeal(NP) swabs in vial transport medium  Result Value Ref Range Status   SARS Coronavirus 2 by RT PCR NEGATIVE NEGATIVE Final    Comment: (NOTE) SARS-CoV-2 target nucleic acids are NOT DETECTED.  The SARS-CoV-2 RNA is generally detectable in upper respiratory specimens during the acute phase of infection. The lowest concentration of SARS-CoV-2 viral copies this assay can detect is 138 copies/mL. A negative result does not preclude SARS-Cov-2 infection and should  not be used as the sole basis for treatment or other patient management decisions. A negative result may occur with  improper specimen collection/handling, submission of specimen other than nasopharyngeal swab, presence of viral mutation(s) within the areas targeted by this assay, and inadequate number of viral copies(<138 copies/mL). A negative result must be combined with clinical observations, patient history, and epidemiological information. The expected result is Negative.  Fact Sheet for Patients:  EntrepreneurPulse.com.au  Fact Sheet for Healthcare Providers:  IncredibleEmployment.be  This test is no t yet approved or cleared by the [place] FDA and  has been authorized for detection and/or diagnosis of SARS-CoV-2 by FDA under an Emergency Use Authorization (EUA). This EUA will remain  in effect (meaning this test can be used) for the duration of the COVID-19 declaration under Section 564(b)(1) of the Act, 21 U.S.C.section 360bbb-3(b)(1), unless the authorization is terminated  or revoked sooner.       Influenza A by  PCR NEGATIVE NEGATIVE Final   Influenza B by PCR NEGATIVE NEGATIVE Final    Comment: (NOTE) The Xpert Xpress SARS-CoV-2/FLU/RSV plus assay is intended as an aid in the diagnosis of influenza from Nasopharyngeal swab specimens and should not be used as a sole basis for treatment. Nasal washings and aspirates are unacceptable for Xpert Xpress SARS-CoV-2/FLU/RSV testing.  Fact Sheet for Patients: EntrepreneurPulse.com.au  Fact Sheet for Healthcare Providers: IncredibleEmployment.be  This test is not yet approved or cleared by the [place] FDA and has been authorized for detection and/or diagnosis of SARS-CoV-2 by FDA under an Emergency Use Authorization (EUA). This EUA will remain in effect (meaning this test can be used) for the duration of the COVID-19 declaration under  Section 564(b)(1) of the Act, 21 U.S.C. section 360bbb-3(b)(1), unless the authorization is terminated or revoked.  Performed at [facility], [place] [address] [place], [place] [address]   Urine Culture     Status: Abnormal (Preliminary result)   Collection Time: 03/30/21  4:04 PM   Specimen: Urine, Clean Catch  Result Value Ref Range Status   Specimen Description   Final    URINE, CLEAN CATCH Performed at [facility], [place] [address], [place], [place] [address]    Special Requests   Final    NONE Performed at [facility], [place] [address] [place], [place] [address]    Culture (A)  Final    >=100,000 COLONIES/mL PROTEUS MIRABILIS CULTURE REINCUBATED FOR BETTER GROWTH SUSCEPTIBILITIES TO FOLLOW Performed at [facility], [place] [address] [place], [place] [address]    Report Status PENDING  Incomplete  MRSA Next Gen by PCR, Nasal     Status: None   Collection Time: 03/31/21  5:32 PM   Specimen: Nasal Mucosa; Nasal Swab  Result Value Ref Range Status   MRSA by PCR Next Gen NOT DETECTED NOT DETECTED Final    Comment: (NOTE) The GeneXpert MRSA Assay (FDA approved for NASAL specimens only), is one component of a comprehensive MRSA colonization surveillance program. It is not intended to diagnose MRSA infection nor to guide or monitor treatment for MRSA infections. Test performance is not FDA approved in patients less than 59 years old. Performed at [facility], [place] [address] [place],  [address]           Radiology Studies: No results found.      Scheduled Meds:  acetaminophen  500 mg Oral TID   aspirin  81 mg Oral Daily   ferrous [id number] C-folic acid  1 capsule Oral QPC breakfast   hydrALAZINE  50 mg Oral TID   insulin aspart  0-9 Units Subcutaneous TID WC   insulin glargine-yfgn  15 Units Subcutaneous QHS   metoprolol tartrate  12.5 mg Oral BID   oxybutynin   5 mg Oral BID   pantoprazole  40 mg Oral Daily   verapamil  120 mg Oral QHS   Continuous Infusions:  sodium chloride 75 mL/hr at 04/01/21 1028   cefTRIAXone (ROCEPHIN)  IV       LOS: 1 day    Time spent: 35 minutes    Irine Seal, MD [facility]   To contact the attending provider between 7A-7P or the covering provider during after hours 7P-7A, please log into the web site [email or web address] and access using universal [place] password for that web site. If you do not have the password, please call the hospital operator.  04/01/2021, 12:06 PM

## 2021-04-01 NOTE — Plan of Care (Signed)
  Problem: Education: Goal: Knowledge of General Education information will improve Description Including pain rating scale, medication(s)/side effects and non-pharmacologic comfort measures Outcome: Progressing   

## 2021-04-02 DIAGNOSIS — S92301A Fracture of unspecified metatarsal bone(s), right foot, initial encounter for closed fracture: Secondary | ICD-10-CM

## 2021-04-02 DIAGNOSIS — Z794 Long term (current) use of insulin: Secondary | ICD-10-CM

## 2021-04-02 DIAGNOSIS — E119 Type 2 diabetes mellitus without complications: Secondary | ICD-10-CM

## 2021-04-02 DIAGNOSIS — E440 Moderate protein-calorie malnutrition: Secondary | ICD-10-CM | POA: Insufficient documentation

## 2021-04-02 LAB — CBC
HCT: 35.7 % — ABNORMAL LOW (ref 36.0–46.0)
Hemoglobin: 11.1 g/dL — ABNORMAL LOW (ref 12.0–15.0)
MCH: 27.2 pg (ref 26.0–34.0)
MCHC: 31.1 g/dL (ref 30.0–36.0)
MCV: 87.5 fL (ref 80.0–100.0)
Platelets: 272 10*3/uL (ref 150–400)
RBC: 4.08 MIL/uL (ref 3.87–5.11)
RDW: 14.7 % (ref 11.5–15.5)
WBC: 10.1 10*3/uL (ref 4.0–10.5)
nRBC: 0 % (ref 0.0–0.2)

## 2021-04-02 LAB — MAGNESIUM: Magnesium: 1.7 mg/dL (ref 1.7–2.4)

## 2021-04-02 LAB — BASIC METABOLIC PANEL
Anion gap: 10 (ref 5–15)
BUN: 23 mg/dL (ref 8–23)
CO2: 17 mmol/L — ABNORMAL LOW (ref 22–32)
Calcium: 8.3 mg/dL — ABNORMAL LOW (ref 8.9–10.3)
Chloride: 107 mmol/L (ref 98–111)
Creatinine, Ser: 1.2 mg/dL — ABNORMAL HIGH (ref 0.44–1.00)
GFR, Estimated: 44 mL/min — ABNORMAL LOW (ref >60)
Glucose, Bld: 86 mg/dL (ref 70–99)
Potassium: 3.7 mmol/L (ref 3.5–5.1)
Sodium: 134 mmol/L — ABNORMAL LOW (ref 135–145)

## 2021-04-02 LAB — GLUCOSE, CAPILLARY
Glucose-Capillary: 81 mg/dL (ref 70–99)
Glucose-Capillary: 142 mg/dL — ABNORMAL HIGH (ref 70–99)
Glucose-Capillary: 117 mg/dL — ABNORMAL HIGH (ref 70–99)
Glucose-Capillary: 168 mg/dL — ABNORMAL HIGH (ref 70–99)

## 2021-04-02 LAB — RESP PANEL BY RT-PCR (FLU A&B, COVID) ARPGX2
Influenza A by PCR: NEGATIVE
Influenza B by PCR: NEGATIVE
SARS Coronavirus 2 by RT PCR: NEGATIVE

## 2021-04-02 LAB — IRON AND TIBC
Iron: 32 ug/dL (ref 28–170)
Saturation Ratios: 15 % (ref 10.4–31.8)
TIBC: 211 ug/dL — ABNORMAL LOW (ref 250–450)
UIBC: 179 ug/dL

## 2021-04-02 LAB — FERRITIN: Ferritin: 88 ng/mL (ref 11–307)

## 2021-04-02 LAB — FOLATE: Folate: 6.8 ng/mL (ref >5.9)

## 2021-04-02 LAB — VITAMIN B12: Vitamin B-12: 434 pg/mL (ref 180–914)

## 2021-04-02 MED ORDER — ACETAMINOPHEN 500 MG PO TABS
500.0000 mg | ORAL_TABLET | Freq: Three times a day (TID) | ORAL | 0 refills | Status: DC
Start: 2021-04-02 — End: 2021-04-08

## 2021-04-02 MED ORDER — SODIUM BICARBONATE 650 MG PO TABS
650.0000 mg | ORAL_TABLET | Freq: Two times a day (BID) | ORAL | Status: DC
  Administered 2021-04-02 – 2021-04-08 (×13): 650 mg via ORAL
  Filled 2021-04-02 (×13): qty 1

## 2021-04-02 MED ORDER — CEFDINIR 300 MG PO CAPS
300.0000 mg | ORAL_CAPSULE | Freq: Every day | ORAL | Status: DC
  Administered 2021-04-02 – 2021-04-05 (×4): 300 mg via ORAL
  Filled 2021-04-02 (×5): qty 1

## 2021-04-02 MED ORDER — CEFDINIR 300 MG PO CAPS: 300.0000 mg | ORAL_CAPSULE | Freq: Every day | ORAL | 0 refills | Status: DC

## 2021-04-02 MED ORDER — ACETAMINOPHEN 325 MG PO TABS: 650.0000 mg | ORAL_TABLET | Freq: Four times a day (QID) | ORAL | Status: AC | PRN

## 2021-04-02 MED ORDER — HYDRALAZINE HCL 25 MG PO TABS: 25.0000 mg | ORAL_TABLET | Freq: Three times a day (TID) | ORAL | 1 refills | Status: DC

## 2021-04-02 MED ORDER — SENNOSIDES-DOCUSATE SODIUM 8.6-50 MG PO TABS: 1.0000 | ORAL_TABLET | Freq: Two times a day (BID) | ORAL | Status: AC

## 2021-04-02 MED ORDER — OXYCODONE HCL 5 MG PO TABS: 2.5000 mg | ORAL_TABLET | ORAL | 0 refills | Status: DC | PRN

## 2021-04-02 MED ORDER — SODIUM BICARBONATE 650 MG PO TABS: 650.0000 mg | ORAL_TABLET | Freq: Two times a day (BID) | ORAL | 0 refills | Status: AC

## 2021-04-02 NOTE — Discharge Summary (Signed)
Physician Discharge Summary  Lisa Villegas SWF:[id number] DOB: 10/14/1930 DOA: 03/30/2021  PCP: [name], MD  Admit date: 03/30/2021 Discharge date: 04/02/2021  Time spent: 60 minutes  Recommendations for Outpatient Follow-up:  Follow-up with [name], MD in 1 to 2 weeks.  On follow-up patient will need a basic metabolic profile, magnesium level done to follow-up on electrolytes and renal function.  Finalization of urine culture sensitivities will need to be followed up upon. Follow-up with Dr. Griffin Basil orthopedics in 1 to 2 weeks.   Discharge Diagnoses:  Principal Problem:   AKI (acute kidney injury) ([place]) Active Problems:   Type II diabetes mellitus with renal manifestations ([place])   Hypertension   Autism   HTN (hypertension), benign   CKD (chronic kidney disease), stage III ([place])   Fracture of unsp metatarsal bone(s), right foot, init   Malnutrition of moderate degree   Multiple closed fractures of metatarsal bone of right foot   Discharge Condition: Stable and improved  Diet recommendation: Carb modified diet  Filed Weights   04/01/21 1056  Weight: 58.4 kg    History of present illness:  HPI per Dr. Janeece Agee Lisa Villegas is a 88 y.o. female with medical history significant of anemia, anxiety, tuberculosis, outperformed, chronic diastolic heart failure, stage IIIa chronic kidney disease, type II DM, diabetic neuropathy, thyroid nodule, hypertension, hyperlipidemia, incontinence, osteopenia, history of other non-hemorrhagic stroke who was sent from her facility due to dark stools, decreased appetite and inability to walk due to injury on her right foot.  She does not add much to her medical history.  The patient takes 2 different iron supplementation pills.  Her hemoglobin level was normal.  She is able to answer simple questions denies headache, abdominal back or chest pain.   ED Course: Initial vital signs were temperature 98.2 F, pulse 74,  respirations 21, BP 214/112 mmHg and O2 sat 100% on room air.  I ordered a 1000 mL half NS bolus in the ER.   Lab work: Urinalysis showed large leukocyte esterase with small hemoglobinuria, ketonuria 20 and proteinuria more than 300 mg/dL.  CBC showed a white count 10.5, hemoglobin 13.3 g/dL platelets 317.  Troponin was 49 then 47 ng/L.  CMP showed a CO2 of 21 mmol/L, glucose 127, BUN 43 and creatinine 1.41 mg/dL.  LFTs were unremarkable, except for an albumin level of 3.4 g/dL.   Imaging: Portable 1 view chest radiograph with no acute cardiopulmonary pathology.  Right foot x-rays showed multiple metatarsal fractures.  Please see images and full radiology report for further details.  Hospital Course:  1 acute kidney injury on ?CKD stage IIIa -Likely secondary to prerenal azotemia due to poor oral intake. -Urinalysis done concerning for UTI. -Renal function improved with hydration.   -Patient maintained on IV fluids, empiric IV antibiotics.  -Renal function stabilized with creatinine at 1.20 by day of discharge.  -Outpatient follow-up with PCP.   2.  Hypomagnesemia -Repleted.   3.  Fracture of metatarsal bone, right foot -Patient seen in consultation by orthopedics who are recommending postop shoe, WBAT. -PT/OT has assessed patient and recommended home health therapies which are ordered. -Follow-up with orthopedics 1 to 2 weeks postdischarge.  4.  Hypertension -Blood pressure elevated. -Patient placed on hydralazine and dose increased as well as maintained on verapamil and Lopressor.   -Patient be discharged home on hydralazine 25 mg 3 times daily, verapamil, Lopressor.   -Outpatient follow-up with PCP.     5.  Type 2 diabetes mellitus with  renal mass stations -Hemoglobin A1c 6.7. -Patient maintained on Semglee 15 units daily as well as sliding scale insulin.   -Oral hypoglycemic agents were held during the hospitalization will be resumed on discharge.   -Outpatient follow-up with  PCP.   6.  Proteus Mirabilis UTI/E. coli UTI -Urine cultures with > 100,000 colonies of Proteus Mirabilis, resistant to Macrobid but pansensitive.  E. coli with 50,000 colonies noted with sensitivities pending at time of discharge. -Patient received IV ciprofloxacin and per pharmacy developed a rash and as such patient placed on IV Rocephin which she tolerated.   -Patient be discharged home on 5 more days of oral Omnicef to complete a 7-day course of antibiotic treatment.   -Outpatient follow-up with PCP.    7.  Anemia -Likely dilutional. -Patient with no overt bleeding. -Hemoglobin stabilized at 11.1.   -Outpatient follow-up.  8.  Moderate protein calorie malnutrition -Nutritional supplementation.    Procedures: Plain films of the right foot 03/30/2021 Chest x-ray 03/30/2021  Consultations: Orthopedics: Noemi [place], [place] 03/30/2021  Discharge Exam: Vitals:   04/01/21 2326 04/02/21 0528  BP: (!) 168/56 (!) 110/93  Pulse: 68 82  Resp: 16 18  Temp:  98.1 F (36.7 C)  SpO2:  100%    General: NAD Cardiovascular: RRR no murmurs rubs or gallops.  No JVD.  No lower extremity edema. Respiratory: CTA B.  No wheezes, no crackles, no rhonchi.  Discharge Instructions   Discharge Instructions     Diet Carb Modified   Complete by: As directed    Increase activity slowly   Complete by: As directed       Allergies as of 04/02/2021       Reactions   Ciprofloxacin Rash   Localized red rash to IV site   Penicillins Rash   On MAR: Tolerates multiple cephalosporins, including cephalexin.        Medication List     TAKE these medications    acetaminophen 325 MG tablet Commonly known as: TYLENOL Take 2 tablets (650 mg total) by mouth every 6 (six) hours as needed for mild pain (or Fever >/= 101).   acetaminophen 500 MG tablet Commonly known as: TYLENOL Take 1 tablet (500 mg total) by mouth 3 (three) times daily for 7 days.   aspirin 81 MG tablet Take 1 tablet (81 mg  total) by mouth daily.   blood glucose meter kit and supplies Dispense based on patient and insurance preference. Use up to four times daily as directed. (FOR ICD-10 E10.9, E11.9).   cefdinir 300 MG capsule Commonly known as: OMNICEF Take 1 capsule (300 mg total) by mouth daily for 5 days.   ferrous sulfate 325 (65 FE) MG tablet Take 325 mg by mouth daily.   hydrALAZINE 25 MG tablet Commonly known as: APRESOLINE Take 25 mg by mouth 3 (three) times daily as needed (SBP 150 or greater, or DPB 85 or greater). What changed: Another medication with the same name was changed. Make sure you understand how and when to take each.   hydrALAZINE 25 MG tablet Commonly known as: APRESOLINE Take 1 tablet (25 mg total) by mouth 3 (three) times daily. What changed:  medication strength how much to take   Integra 62.5-62.5-40-3 MG Caps Take 1 capsule by mouth daily.   Lantus SoloStar 100 UNIT/ML Solostar Pen Generic drug: insulin glargine Inject 12 Units into the skin at bedtime. What changed: how much to take   metFORMIN 500 MG tablet Commonly known as: GLUCOPHAGE Take 1  tablet (500 mg total) by mouth See admin instructions. Take 1031m in the morning and 5037mat night. What changed:  how much to take when to take this additional instructions   omeprazole 40 MG capsule Commonly known as: PRILOSEC Take 40 mg by mouth daily.   oxybutynin 5 MG tablet Commonly known as: DITROPAN Take 5 mg by mouth 2 (two) times daily.   oxyCODONE 5 MG immediate release tablet Commonly known as: Oxy IR/ROXICODONE Take 0.5 tablets (2.5 mg total) by mouth every 4 (four) hours as needed for moderate pain.   PREVIDENT 5000 BOOSTER PLUS DT Place 1 application onto teeth every evening.   senna-docusate 8.6-50 MG tablet Commonly known as: Senokot S Take 1 tablet by mouth 2 (two) times daily.   SENSODYNE COMPLETE PROTECTION MT Use as directed in the mouth or throat. Mint,place one application onto  teeth daily as directed   sodium bicarbonate 650 MG tablet Take 1 tablet (650 mg total) by mouth 2 (two) times daily for 5 days.   verapamil 120 MG CR tablet Commonly known as: CALAN-SR Take 120 mg by mouth at bedtime.       Allergies  Allergen Reactions   Ciprofloxacin Rash    Localized red rash to IV site   Penicillins Rash    On MAR: Tolerates multiple cephalosporins, including cephalexin.    Follow-up Information     RaMerrilee SeashoreMD. Schedule an appointment as soon as possible for a visit in 2 week(s).   Specialty: Internal Medicine Contact information: [phone]         VaHiram GashMD. Schedule an appointment as soon as possible for a visit in 1 week(s).   Specialty: Orthopedic Surgery Why: Follow-up in 1 to 2 weeks. Contact information: [phone]                  The results of significant diagnostics from this hospitalization (including imaging, microbiology, ancillary and laboratory) are listed below for reference.    Significant Diagnostic Studies: CT Head Wo Contrast  Result Date: 03/24/2021 CLINICAL DATA:  Head trauma, minor (Age >= 65y) EXAM: CT HEAD WITHOUT CONTRAST TECHNIQUE: Contiguous axial images were obtained from the base of the skull through the vertex without intravenous contrast. COMPARISON:  CT head September 17, 2019. FINDINGS: Brain: No evidence of acute infarction, hemorrhage, hydrocephalus, extra-axial collection or mass lesion/mass effect. Similar remote cortical infarct in the left frontal operculum. Similar generalized atrophy and chronic microvascular ischemic disease. Vascular: No hyperdense vessel identified. Calcific intracranial atherosclerosis. Skull: No acute fracture. Sinuses/Orbits: Mild bilateral sphenoid sinus mucosal thickening and small amount of frothy secretions within the left sphenoid sinus. Other: No  mastoid effusions.  Forehead scalp lipoma. IMPRESSION: 1. No evidence of acute intracranial abnormality. 2. Similar remote left frontal cortical infarct, generalized atrophy and chronic microvascular ischemic disease. Electronically Signed   By: FrMargaretha Sheffield.D.   On: 03/24/2021 15:01   DG Chest Portable 1 View  Result Date: 03/30/2021 CLINICAL DATA:  Weakness EXAM: PORTABLE CHEST 1 VIEW COMPARISON:  12/04/2019 FINDINGS: The heart size and mediastinal contours are within normal limits. Both lungs are clear. No pleural effusion. IMPRESSION: No acute process in the chest. Electronically Signed   By: PrMacy Mis.D.   On: 03/30/2021 10:20   DG Foot Complete Right  Result Date: 03/30/2021 CLINICAL DATA:  Swelling and bruising, patient has autism. EXAM: RIGHT FOOT  COMPLETE - 3+ VIEW COMPARISON:  None. FINDINGS: Mildly displaced comminuted fracture of the proximal phalanx of the first digit. There are displaced fracture of the third, fourth and fifth metatarsal necks, likely a chronic process. 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 Soft tissue swelling about the dorsum of the foot. Prominent vascular calcifications. IMPRESSION: 1. Mildly displaced comminuted fracture of the proximal phalanx of the first digit. 2. Displaced fractures of the third, fourth and fifth metatarsal necks, which may be a chronic process. Correlate with prior imaging and clinical history. 3. 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 4. Soft tissue swelling about the dorsum of the foot. Electronically Signed   By: Keane Police D.O.   On: 03/30/2021 11:43    Microbiology: Recent Results (from the past 240 hour(s))  Resp Panel by RT-PCR (Flu A&B, Covid) Nasopharyngeal Swab     Status: None   Collection Time: 03/30/21 11:00 AM   Specimen: Nasopharyngeal Swab; Nasopharyngeal(NP) swabs in vial transport medium  Result Value Ref Range Status   SARS Coronavirus 2 by RT PCR NEGATIVE NEGATIVE  Final    Comment: (NOTE) SARS-CoV-2 target nucleic acids are NOT DETECTED.  The SARS-CoV-2 RNA is generally detectable in upper respiratory specimens during the acute phase of infection. The lowest concentration of SARS-CoV-2 viral copies this assay can detect is 138 copies/mL. A negative result does not preclude SARS-Cov-2 infection and should not be used as the sole basis for treatment or other patient management decisions. A negative result may occur with  improper specimen collection/handling, submission of specimen other than nasopharyngeal swab, presence of viral mutation(s) within the areas targeted by this assay, and inadequate number of viral copies(<138 copies/mL). A negative result must be combined with clinical observations, patient history, and epidemiological information. The expected result is Negative.  Fact Sheet for Patients:  EntrepreneurPulse.com.au  Fact Sheet for Healthcare Providers:  IncredibleEmployment.be  This test is no t yet approved or cleared by the [place] FDA and  has been authorized for detection and/or diagnosis of SARS-CoV-2 by FDA under an Emergency Use Authorization (EUA). This EUA will remain  in effect (meaning this test can be used) for the duration of the COVID-19 declaration under Section 564(b)(1) of the Act, 21 U.S.C.section 360bbb-3(b)(1), unless the authorization is terminated  or revoked sooner.       Influenza A by PCR NEGATIVE NEGATIVE Final   Influenza B by PCR NEGATIVE NEGATIVE Final    Comment: (NOTE) The Xpert Xpress SARS-CoV-2/FLU/RSV plus assay is intended as an aid in the diagnosis of influenza from Nasopharyngeal swab specimens and should not be used as a sole basis for treatment. Nasal washings and aspirates are unacceptable for Xpert Xpress SARS-CoV-2/FLU/RSV testing.  Fact Sheet for Patients: EntrepreneurPulse.com.au  Fact Sheet for Healthcare  Providers: IncredibleEmployment.be  This test is not yet approved or cleared by the [place] FDA and has been authorized for detection and/or diagnosis of SARS-CoV-2 by FDA under an Emergency Use Authorization (EUA). This EUA will remain in effect (meaning this test can be used) for the duration of the COVID-19 declaration under Section 564(b)(1) of the Act, 21 U.S.C. section 360bbb-3(b)(1), unless the authorization is terminated or revoked.  Performed at [facility], [place] [address], [place], [place] [address]   Urine Culture     Status: Abnormal (Preliminary result)   Collection Time: 03/30/21  4:04 PM   Specimen: Urine, Clean Catch  Result Value Ref Range Status   Specimen Description   Final  URINE, CLEAN CATCH Performed at [facility], [place] [address], [place], [place] [address]    Special Requests   Final    NONE Performed at [facility], [place] [address] [place], [place] [address]    Culture (A)  Final    >=100,000 COLONIES/mL PROTEUS MIRABILIS 50,000 COLONIES/mL ESCHERICHIA COLI SUSCEPTIBILITIES TO FOLLOW Performed at [facility], [place] [address], [place], [place] [address]    Report Status PENDING  Incomplete   Organism ID, Bacteria PROTEUS MIRABILIS (A)  Final      Susceptibility   Proteus mirabilis - MIC*    AMPICILLIN <=2 SENSITIVE Sensitive     CEFAZOLIN <=4 SENSITIVE Sensitive     CEFEPIME <=0.12 SENSITIVE Sensitive     CEFTRIAXONE <=0.25 SENSITIVE Sensitive     CIPROFLOXACIN <=0.25 SENSITIVE Sensitive     GENTAMICIN <=1 SENSITIVE Sensitive     IMIPENEM 2 SENSITIVE Sensitive     NITROFURANTOIN 128 RESISTANT Resistant     TRIMETH/SULFA <=20 SENSITIVE Sensitive     AMPICILLIN/SULBACTAM <=2 SENSITIVE Sensitive     PIP/TAZO <=4 SENSITIVE Sensitive     * >=100,000 COLONIES/mL PROTEUS MIRABILIS  MRSA Next Gen by PCR, Nasal     Status: None   Collection Time: 03/31/21  5:32  PM   Specimen: Nasal Mucosa; Nasal Swab  Result Value Ref Range Status   MRSA by PCR Next Gen NOT DETECTED NOT DETECTED Final    Comment: (NOTE) The GeneXpert MRSA Assay (FDA approved for NASAL specimens only), is one component of a comprehensive MRSA colonization surveillance program. It is not intended to diagnose MRSA infection nor to guide or monitor treatment for MRSA infections. Test performance is not FDA approved in patients less than 30 years old. Performed at [facility], [place] [address], [place], [place] [address]   Resp Panel by RT-PCR (Flu A&B, Covid) Nasopharyngeal Swab     Status: None   Collection Time: 04/02/21 12:08 PM   Specimen: Nasopharyngeal Swab; Nasopharyngeal(NP) swabs in vial transport medium  Result Value Ref Range Status   SARS Coronavirus 2 by RT PCR NEGATIVE NEGATIVE Final    Comment: (NOTE) SARS-CoV-2 target nucleic acids are NOT DETECTED.  The SARS-CoV-2 RNA is generally detectable in upper respiratory specimens during the acute phase of infection. The lowest concentration of SARS-CoV-2 viral copies this assay can detect is 138 copies/mL. A negative result does not preclude SARS-Cov-2 infection and should not be used as the sole basis for treatment or other patient management decisions. A negative result may occur with  improper specimen collection/handling, submission of specimen other than nasopharyngeal swab, presence of viral mutation(s) within the areas targeted by this assay, and inadequate number of viral copies(<138 copies/mL). A negative result must be combined with clinical observations, patient history, and epidemiological information. The expected result is Negative.  Fact Sheet for Patients:  EntrepreneurPulse.com.au  Fact Sheet for Healthcare Providers:  IncredibleEmployment.be  This test is no t yet approved or cleared by the [place] FDA and  has been authorized for  detection and/or diagnosis of SARS-CoV-2 by FDA under an Emergency Use Authorization (EUA). This EUA will remain  in effect (meaning this test can be used) for the duration of the COVID-19 declaration under Section 564(b)(1) of the Act, 21 U.S.C.section 360bbb-3(b)(1), unless the authorization is terminated  or revoked sooner.       Influenza A by PCR NEGATIVE NEGATIVE Final   Influenza B by PCR NEGATIVE NEGATIVE Final  Comment: (NOTE) The Xpert Xpress SARS-CoV-2/FLU/RSV plus assay is intended as an aid in the diagnosis of influenza from Nasopharyngeal swab specimens and should not be used as a sole basis for treatment. Nasal washings and aspirates are unacceptable for Xpert Xpress SARS-CoV-2/FLU/RSV testing.  Fact Sheet for Patients: EntrepreneurPulse.com.au  Fact Sheet for Healthcare Providers: IncredibleEmployment.be  This test is not yet approved or cleared by the [place] FDA and has been authorized for detection and/or diagnosis of SARS-CoV-2 by FDA under an Emergency Use Authorization (EUA). This EUA will remain in effect (meaning this test can be used) for the duration of the COVID-19 declaration under Section 564(b)(1) of the Act, 21 U.S.C. section 360bbb-3(b)(1), unless the authorization is terminated or revoked.  Performed at [facility], [place] [address] [place], [place] [address]      Labs: Basic Metabolic Panel: Recent Labs  Lab 03/30/21 1211 03/31/21 0517 04/01/21 0553 04/02/21 0520  NA 137 134* 131* 134*  K 4.5 4.0 3.8 3.7  CL 108 103 102 107  CO2 21* 22 20* 17*  GLUCOSE 127* 111* 102* 86  BUN 43* 37* 29* 23  CREATININE 1.41* 1.20* 1.19* 1.20*  CALCIUM 9.1 8.6* 8.3* 8.3*  MG  --  1.4* 2.2 1.7   Liver Function Tests: Recent Labs  Lab 03/30/21 1211  AST 13*  ALT 12  ALKPHOS 103  BILITOT 0.9  PROT 7.4  ALBUMIN 3.4*   No results for input(s): LIPASE, AMYLASE in the last 168  hours. No results for input(s): AMMONIA in the last 168 hours. CBC: Recent Labs  Lab 03/30/21 1211 04/01/21 0553 04/02/21 0520  WBC 10.5 9.0 10.1  NEUTROABS 8.4* 6.3  --   HGB 13.3 10.7* 11.1*  HCT 41.7 33.1* 35.7*  MCV 85.1 84.7 87.5  PLT 317 244 272   Cardiac Enzymes: No results for input(s): CKTOTAL, CKMB, CKMBINDEX, TROPONINI in the last 168 hours. BNP: BNP (last 3 results) No results for input(s): BNP in the last 8760 hours.  ProBNP (last 3 results) No results for input(s): PROBNP in the last 8760 hours.  CBG: Recent Labs  Lab 04/01/21 1140 04/01/21 1722 04/01/21 2048 04/02/21 0748 04/02/21 1144  GLUCAP 110* 94 119* 81 142*       Signed:  Irine Seal MD.  [facility] 04/02/2021, 1:46 PM

## 2021-04-02 NOTE — TOC Transition Note (Signed)
Transition of Care [facility]) - CM/SW Discharge Note   Patient Details  Name: Lisa Villegas MRN: [id number] Date of Birth: [birth year]  Transition of Care [facility]) CM/SW Contact:  Trish Mage, LCSW Phone Number: 04/02/2021, 4:32 PM   Clinical Narrative:   Patient who is stable for d/c will return to [facility]. FL2, D/C summary and orders faxed to facility.  Confirmed with [facility] POA that she would transport this afternoon. Nursing, please call report to [phone].  TOC sign off.  Addendum:  Received call from RN stating patient was 2 person assist to get into the car, essentially non-ambulatory due to pain related to foot, and that [name] had alerted facility that patient is not ambulatory and facility is saying she needs a higher level of care.  RN and I decided together best course of action was to get patient back to her room while we sort things out.  Will need to be seen by PT again, perhaps with [facility] POA present for help with following directions. MD alerted.    Final next level of care: Assisted Living Barriers to Discharge: No Barriers Identified   Patient Goals and CMS Choice     Choice offered to / list presented to : [facility] POA / [place]  Discharge Placement                       Discharge Plan and Services In-house Referral: Clinical Social Work                                   Social Determinants of Health (SDOH) Interventions     Readmission Risk Interventions Readmission Risk Prevention Plan 08/01/2019 04/02/2019  Transportation Screening Complete Complete  PCP or Specialist Appt within 3-5 Days Complete Complete  HRI or [place] Complete Complete  Social Work Consult for [place] Planning/Counseling Complete Complete  Palliative Care Screening Complete Not Applicable  Medication Review Press photographer) Complete Referral to Pharmacy  Some recent data might be hidden
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
## 2021-04-02 NOTE — Progress Notes (Addendum)
This RN and Jackiet NT, assisted patient to [place] car. Pt was a 2 assist and patient was still unsteady on her feet d/t Right foot injury. The POA and this RN feel that she was unsafe to go back to ALF. The [facility] was very concerned and contacted the facility who states they are not going to take the patient if the patient is not physically ready and d/t staffing.  MD and Carolynn Sayers notified and aware of patient returning to 5E due to unsafe discharge to ALF.  Pt safely placed back in bed with bed alarm on.

## 2021-04-03 LAB — CBC
HCT: 37.8 % (ref 36.0–46.0)
Hemoglobin: 12.1 g/dL (ref 12.0–15.0)
MCH: 27 pg (ref 26.0–34.0)
MCHC: 32 g/dL (ref 30.0–36.0)
MCV: 84.4 fL (ref 80.0–100.0)
Platelets: 310 10*3/uL (ref 150–400)
RBC: 4.48 MIL/uL (ref 3.87–5.11)
RDW: 15 % (ref 11.5–15.5)
WBC: 10.5 10*3/uL (ref 4.0–10.5)
nRBC: 0 % (ref 0.0–0.2)

## 2021-04-03 LAB — GLUCOSE, CAPILLARY
Glucose-Capillary: 134 mg/dL — ABNORMAL HIGH (ref 70–99)
Glucose-Capillary: 80 mg/dL (ref 70–99)
Glucose-Capillary: 86 mg/dL (ref 70–99)
Glucose-Capillary: 82 mg/dL (ref 70–99)
Glucose-Capillary: 116 mg/dL — ABNORMAL HIGH (ref 70–99)
Glucose-Capillary: 54 mg/dL — ABNORMAL LOW (ref 70–99)

## 2021-04-03 LAB — BASIC METABOLIC PANEL
Anion gap: 7 (ref 5–15)
BUN: 18 mg/dL (ref 8–23)
CO2: 21 mmol/L — ABNORMAL LOW (ref 22–32)
Calcium: 8.8 mg/dL — ABNORMAL LOW (ref 8.9–10.3)
Chloride: 105 mmol/L (ref 98–111)
Creatinine, Ser: 0.89 mg/dL (ref 0.44–1.00)
GFR, Estimated: >60 mL/min (ref >60)
Glucose, Bld: 113 mg/dL — ABNORMAL HIGH (ref 70–99)
Potassium: 3.5 mmol/L (ref 3.5–5.1)
Sodium: 133 mmol/L — ABNORMAL LOW (ref 135–145)

## 2021-04-03 MED ORDER — HYDRALAZINE HCL 20 MG/ML IJ SOLN
5.0000 mg | INTRAMUSCULAR | Status: DC | PRN
  Administered 2021-04-03 – 2021-04-04 (×2): 5 mg via INTRAVENOUS
  Filled 2021-04-03 (×2): qty 1

## 2021-04-03 MED ORDER — HYDRALAZINE HCL 50 MG PO TABS
50.0000 mg | ORAL_TABLET | Freq: Once | ORAL | Status: AC
  Administered 2021-04-03: 50 mg via ORAL

## 2021-04-03 NOTE — Progress Notes (Signed)
Patient ID: Lisa Villegas, female   DOB: 09-24-1930, 88 y.o.   MRN: [id number] Blood pressure elevated 222/95, no IV access, RN trying to start IV access, will give one time oral hydralazine 50 mg for now. Phillips Climes MD

## 2021-04-03 NOTE — Progress Notes (Signed)
Hypoglycemic Event  CBG: 52  Treatment: 4 oz juice/soda  Symptoms: None  Follow-up CBG: Time:2220 CBG Result:116  Possible Reasons for Event: Inadequate meal intake  Comments/MD notified:    Jason Coop

## 2021-04-03 NOTE — Progress Notes (Signed)
Physical Therapy Treatment Patient Details Name: Lisa Villegas MRN: [id number] DOB: 02-15-1931 Today's Date: 04/03/2021   History of Present Illness Lisa Villegas is a 88 y.o. female  admitted on  03/30/21  due to dark stools, decreased appetite and inability to walk due to injury on her right foot.  X-rays of the right foot demonstrate 1st digit proximal phalanx fracture, and minimally displaced fractures of the 3rd, 4th, and 5th metatarsal necks - ortho recommended post op shoe WBAT.  Pt with medical history significant of anemia, autism,anxiety, tuberculosis,  chronic diastolic heart failure, stage IIIa chronic kidney disease, type II DM, diabetic neuropathy, thyroid nodule, hypertension, hyperlipidemia, incontinence, osteopenia, history of other non-hemorrhagic stroke    PT Comments    Noted that return to ALF with HHPT was initially recommended due to pt with autism and felt she would do better in familiar environment.  However, at attempted d/c facility reporting they cannot handle pt at this level of care - she is normally ambulatory.  Updated recommendations to SNF due to still requiring max x 2 to sit.  Pt only sitting EOB today and with strong posterior lean/push.  Suspect posterior lean more related to pt not wanting to get up rather than actual debility (she had sat up all night watching movies and was wanting to sleep).  Only able to sit EOB long enough to take meds with RN then returned to supine.    Recommendations for follow up therapy are one component of a multi-disciplinary discharge planning process, led by the attending physician.  Recommendations may be updated based on patient status, additional functional criteria and insurance authorization.  Follow Up Recommendations  Skilled nursing-short term rehab (<3 hours/day)     Assistance Recommended at Discharge Frequent or constant Supervision/Assistance  Patient can return home with the following Two people to help with  walking and/or transfers;Two people to help with bathing/dressing/bathroom   Equipment Recommendations  None recommended by PT    Recommendations for Other Services       Precautions / Restrictions Precautions Precautions: Fall Splint/Cast: post op shoe on right Restrictions RLE Weight Bearing: Weight bearing as tolerated     Mobility  Bed Mobility Overal bed mobility: Needs Assistance Bed Mobility: Rolling;Supine to Sit;Sit to Supine Rolling: Max assist;+2 for physical assistance   Supine to sit: +2 for physical assistance;Total assist Sit to supine: Mod assist;+2 for physical assistance   General bed mobility comments: Pt not following commands.  Requiring max encouragement and total assist to sit EOB and take meds with nursing. Pt pushing back to lay down - once laying , pulls covers over head.    Transfers                   General transfer comment: unable - pt not participating    Ambulation/Gait                   Stairs             Wheelchair Mobility    Modified Rankin (Stroke Patients Only)       Balance Overall balance assessment: Needs assistance Sitting-balance support: Bilateral upper extremity supported Sitting balance-Leahy Scale: Poor Sitting balance - Comments: Pt requiring mod-max A but seems more related to pt purposefully pushing back to return to supine.  Encouraged to sit EOB 5 mins at least to take meds.  Cognition Arousal/Alertness: Awake/alert Behavior During Therapy: Restless Overall Cognitive Status: History of cognitive impairments - at baseline                                 General Comments: Pt most likely at baseline.  Follows some simple commands.  Occasional "yes/no" responses.  RN reports pt sat up all night watching movies and is wanting to sleep.        Exercises      General Comments        Pertinent Vitals/Pain Pain  Assessment: Faces Faces Pain Scale: Hurts a little bit    Home Living                          Prior Function            PT Goals (current goals can now be found in the care plan section) Progress towards PT goals: Not progressing toward goals - comment    Frequency    Min 2X/week      PT Plan Discharge plan needs to be updated    Co-evaluation              AM-PAC PT "6 Clicks" Mobility   Outcome Measure  Help needed turning from your back to your side while in a flat bed without using bedrails?: A Lot Help needed moving from lying on your back to sitting on the side of a flat bed without using bedrails?: Total Help needed moving to and from a bed to a chair (including a wheelchair)?: Total Help needed standing up from a chair using your arms (e.g., wheelchair or bedside chair)?: Total Help needed to walk in hospital room?: Total Help needed climbing 3-5 steps with a railing? : Total 6 Click Score: 7    End of Session   Activity Tolerance: Other (comment) (limited due to confusion, self limiting -wanting to rest) Patient left: in bed;with call bell/phone within reach;with bed alarm set Nurse Communication: Mobility status PT Visit Diagnosis: Unsteadiness on feet (R26.81);History of falling (Z91.81)     Time: 0272-5366 PT Time Calculation (min) (ACUTE ONLY): 10 min  Charges:  $Therapeutic Activity: 8-22 mins                     Abran Richard, PT Acute Rehab Services Pager [phone] [facility] [place] 04/03/2021, 11:11 AM

## 2021-04-03 NOTE — Progress Notes (Signed)
PROGRESS NOTE    Lisa Villegas  NUU:[id number] DOB: 02-25-1931 DOA: 03/30/2021 PCP: [name], MD    Chief Complaint  Patient presents with   Melena   decreased appetite    Brief Narrative:  Patient is a 88 year old female history of anemia, anxiety, chronic diastolic CHF, stage IIIa CKD, type 2 diabetes, diabetic neuropathy, thyroid nodule, osteopenia, history of nonhemorrhagic CVA sent from facility secondary to dark stools, decreased appetite, inability to walk due to injury to the right foot.  Patient noted to be taking 2 different iron supplementation pills.  Hemoglobin normal.  Work-up in the ED with urinalysis concerning for UTI.  Troponin is flattened but elevated at 49 and 47.  Chest x-ray unremarkable.  Plain films of the right foot with multiple metatarsal fractures.  Orthopedics consulted.  Patient placed on empiric IV antibiotics.   Assessment & Plan:   Principal Problem:   AKI (acute kidney injury) ([place]) Active Problems:   Type II diabetes mellitus with renal manifestations ([place])   Hypertension   Autism   HTN (hypertension), benign   CKD (chronic kidney disease), stage III ([facility])   Fracture of unsp metatarsal bone(s), right foot, init   Malnutrition of moderate degree   Multiple closed fractures of metatarsal bone of right foot  1 acute kidney injury on ?CKD stage IIIa -Likely secondary to prerenal azotemia due to poor oral intake. -Urinalysis done concerning for UTI. -Renal function improved with hydration.   -Supportive care.   2.  Hypomagnesemia -Repleted.  Magnesium at 1.7. -Repeat labs in the AM. -Follow.    3.  Fracture of metatarsal bone, right foot -Patient seen in consultation by orthopedics who are recommending postop shoe, WBAT. -PT/OT has assessed patient and recommended home health therapies. -Patient was supposed to go back to assisted living facility on 04/02/2021 however due to significant weakness and debility assisted living  facility unable to accept patient and recommending SNF. -TOC consulted for SNF placement. -Outpatient follow-up with orthopedics.  4.  Hypertension -Blood pressure improved on current regimen of hydralazine, verapamil, Lopressor.     5.  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 -CBG 80 this morning.   -Continue Semglee 15 units daily, SSI.   6.  Proteus Mirabilis UTI -Urine cultures with > 100,000 colonies of Proteus Mirabilis, sensitivities pending. -Patient received IV ciprofloxacin and per pharmacy developed a rash and as such changed to IV Rocephin and subsequently transitioned to [facility].    7.  Anemia of chronic disease -Likely dilutional. -Patient with no overt bleeding. -Anemia panel consistent with anemia of chronic disease.  -H&H stable. -Transfusion threshold hemoglobin < 7.  DVT prophylaxis: SCDs Code Status: DNR Family Communication: No family at bedside. Disposition:   Status is: Inpatient  The patient will require care spanning > 2 midnights and should be moved to inpatient because: Severity of illness      Consultants:  Orthopedics: Noemi Chapel, PA 03/30/2021  Procedures:  Plain films of the right foot 03/30/2021 Chest x-ray 03/30/2021   Antimicrobials:  IV Rocephin 03/31/2021>>>> IV ciprofloxacin 03/30/2021 x 1 dose (per pharmacy patient developed rash)   Subjective: Patient sleeping with covers over her head.  Per nurse tech patient up all night watching television.  Objective: Vitals:   04/02/21 2136 04/03/21 0426 04/03/21 0505 04/03/21 0626  BP: (!) 172/85 (!) 223/93 (!) 196/103 136/64  Pulse: 65 93 99 90  Resp:  (!) 22  18  Temp: 98.6 F (37 C) 98.6 F (  37 C)  (!) 97.3 F (36.3 C)  TempSrc: Oral Oral  Oral  SpO2: 97% 99% 98% 100%  Weight:      Height:        Intake/Output Summary (Last 24 hours) at 04/03/2021 1209 Last data filed at 04/03/2021 0300 Gross per 24 hour  Intake --  Output 1150 ml  Net -1150  ml    Filed Weights   04/01/21 1056  Weight: 58.4 kg    Examination:  General exam: NAD. Respiratory system: Lungs clear to auscultation bilaterally.  No wheezes, no crackles, no rhonchi.  Normal respiratory effort.  Cardiovascular system: RRR no murmurs rubs or gallops.  No JVD.  No lower extremity edema.  Gastrointestinal system: Abdomen is soft, nontender, nondistended, positive bowel sounds.  No rebound.  No guarding. Central nervous system: Alert and oriented. No focal neurological deficits. Extremities: Right foot with bruising noted. Skin: No rashes, lesions or ulcers Psychiatry: Judgement and insight poor to fair. Mood & affect appropriate.     Data Reviewed: I have personally reviewed following labs and imaging studies  CBC: Recent Labs  Lab 03/30/21 1211 04/01/21 0553 04/02/21 0520 04/03/21 0548  WBC 10.5 9.0 10.1 10.5  NEUTROABS 8.4* 6.3  --   --   HGB 13.3 10.7* 11.1* 12.1  HCT 41.7 33.1* 35.7* 37.8  MCV 85.1 84.7 87.5 84.4  PLT 317 244 272 310     Basic Metabolic Panel: Recent Labs  Lab 03/30/21 1211 03/31/21 0517 04/01/21 0553 04/02/21 0520 04/03/21 0548  NA 137 134* 131* 134* 133*  K 4.5 4.0 3.8 3.7 3.5  CL 108 103 102 107 105  CO2 21* 22 20* 17* 21*  GLUCOSE 127* 111* 102* 86 113*  BUN 43* 37* 29* 23 18  CREATININE 1.41* 1.20* 1.19* 1.20* 0.89  CALCIUM 9.1 8.6* 8.3* 8.3* 8.8*  MG  --  1.4* 2.2 1.7  --      GFR: Estimated Creatinine Clearance: 39.3 mL/min (by C-G formula based on SCr of 0.89 mg/dL).  Liver Function Tests: Recent Labs  Lab 03/30/21 1211  AST 13*  ALT 12  ALKPHOS 103  BILITOT 0.9  PROT 7.4  ALBUMIN 3.4*     CBG: Recent Labs  Lab 04/02/21 1649 04/02/21 2124 04/03/21 0434 04/03/21 0737 04/03/21 1143  GLUCAP 117* 168* 134* 80 86      Recent Results (from the past 240 hour(s))  Resp Panel by RT-PCR (Flu A&B, Covid) Nasopharyngeal Swab     Status: None   Collection Time: 03/30/21 11:00 AM   Specimen:  Nasopharyngeal Swab; Nasopharyngeal(NP) swabs in vial transport medium  Result Value Ref Range Status   SARS Coronavirus 2 by RT PCR NEGATIVE NEGATIVE Final    Comment: (NOTE) SARS-CoV-2 target nucleic acids are NOT DETECTED.  The SARS-CoV-2 RNA is generally detectable in upper respiratory specimens during the acute phase of infection. The lowest concentration of SARS-CoV-2 viral copies this assay can detect is 138 copies/mL. A negative result does not preclude SARS-Cov-2 infection and should not be used as the sole basis for treatment or other patient management decisions. A negative result may occur with  improper specimen collection/handling, submission of specimen other than nasopharyngeal swab, presence of viral mutation(s) within the areas targeted by this assay, and inadequate number of viral copies(<138 copies/mL). A negative result must be combined with clinical observations, patient history, and epidemiological information. The expected result is Negative.  Fact Sheet for Patients:  EntrepreneurPulse.com.au  Fact Sheet for Healthcare Providers:  IncredibleEmployment.be  This test is no t yet approved or cleared by the [place] and  has been authorized for detection and/or diagnosis of SARS-CoV-2 by FDA under an Emergency Use Authorization (EUA). This EUA will remain  in effect (meaning this test can be used) for the duration of the COVID-19 declaration under Section 564(b)(1) of the Act, 21 U.S.C.section 360bbb-3(b)(1), unless the authorization is terminated  or revoked sooner.       Influenza A by PCR NEGATIVE NEGATIVE Final   Influenza B by PCR NEGATIVE NEGATIVE Final    Comment: (NOTE) The Xpert Xpress SARS-CoV-2/FLU/RSV plus assay is intended as an aid in the diagnosis of influenza from Nasopharyngeal swab specimens and should not be used as a sole basis for treatment. Nasal washings and aspirates are unacceptable for  Xpert Xpress SARS-CoV-2/FLU/RSV testing.  Fact Sheet for Patients: EntrepreneurPulse.com.au  Fact Sheet for Healthcare Providers: IncredibleEmployment.be  This test is not yet approved or cleared by the [place] FDA and has been authorized for detection and/or diagnosis of SARS-CoV-2 by FDA under an Emergency Use Authorization (EUA). This EUA will remain in effect (meaning this test can be used) for the duration of the COVID-19 declaration under Section 564(b)(1) of the Act, 21 U.S.C. section 360bbb-3(b)(1), unless the authorization is terminated or revoked.  Performed at [facility], [place] [address], [place], [place] [address]   Urine Culture     Status: Abnormal (Preliminary result)   Collection Time: 03/30/21  4:04 PM   Specimen: Urine, Clean Catch  Result Value Ref Range Status   Specimen Description   Final    URINE, CLEAN CATCH Performed at [facility], [place] [address] [place], [place] [address]    Special Requests   Final    NONE Performed at [facility], [place] [address] [place], [place] [address]    Culture (A)  Final    >=100,000 COLONIES/mL PROTEUS MIRABILIS 50,000 COLONIES/mL ESCHERICHIA COLI REPEATING SENSITIVITIES Performed at [facility], [place] [address] [place], [place] [address]    Report Status PENDING  Incomplete   Organism ID, Bacteria PROTEUS MIRABILIS (A)  Final      Susceptibility   Proteus mirabilis - MIC*    AMPICILLIN <=2 SENSITIVE Sensitive     CEFAZOLIN <=4 SENSITIVE Sensitive     CEFEPIME <=0.12 SENSITIVE Sensitive     CEFTRIAXONE <=0.25 SENSITIVE Sensitive     CIPROFLOXACIN <=0.25 SENSITIVE Sensitive     GENTAMICIN <=1 SENSITIVE Sensitive     IMIPENEM 2 SENSITIVE Sensitive     NITROFURANTOIN 128 RESISTANT Resistant     TRIMETH/SULFA <=20 SENSITIVE Sensitive     AMPICILLIN/SULBACTAM <=2 SENSITIVE Sensitive     PIP/TAZO <=4 SENSITIVE  Sensitive     * >=100,000 COLONIES/mL PROTEUS MIRABILIS  MRSA Next Gen by PCR, Nasal     Status: None   Collection Time: 03/31/21  5:32 PM   Specimen: Nasal Mucosa; Nasal Swab  Result Value Ref Range Status   MRSA by PCR Next Gen NOT DETECTED NOT DETECTED Final    Comment: (NOTE) The GeneXpert MRSA Assay (FDA approved for NASAL specimens only), is one component of a comprehensive MRSA colonization surveillance program. It is not intended to diagnose MRSA infection nor to guide or monitor treatment for MRSA infections. Test performance is not FDA approved in patients less than 28 years old. Performed at [facility], [place] [address] [place], [place] [address]   Resp Panel by  RT-PCR (Flu A&B, Covid) Nasopharyngeal Swab     Status: None   Collection Time: 04/02/21 12:08 PM   Specimen: Nasopharyngeal Swab; Nasopharyngeal(NP) swabs in vial transport medium  Result Value Ref Range Status   SARS Coronavirus 2 by RT PCR NEGATIVE NEGATIVE Final    Comment: (NOTE) SARS-CoV-2 target nucleic acids are NOT DETECTED.  The SARS-CoV-2 RNA is generally detectable in upper respiratory specimens during the acute phase of infection. The lowest concentration of SARS-CoV-2 viral copies this assay can detect is 138 copies/mL. A negative result does not preclude SARS-Cov-2 infection and should not be used as the sole basis for treatment or other patient management decisions. A negative result may occur with  improper specimen collection/handling, submission of specimen other than nasopharyngeal swab, presence of viral mutation(s) within the areas targeted by this assay, and inadequate number of viral copies(<138 copies/mL). A negative result must be combined with clinical observations, patient history, and epidemiological information. The expected result is Negative.  Fact Sheet for Patients:  EntrepreneurPulse.com.au  Fact Sheet for Healthcare Providers:   IncredibleEmployment.be  This test is no t yet approved or cleared by the [place] FDA and  has been authorized for detection and/or diagnosis of SARS-CoV-2 by FDA under an Emergency Use Authorization (EUA). This EUA will remain  in effect (meaning this test can be used) for the duration of the COVID-19 declaration under Section 564(b)(1) of the Act, 21 U.S.C.section 360bbb-3(b)(1), unless the authorization is terminated  or revoked sooner.       Influenza A by PCR NEGATIVE NEGATIVE Final   Influenza B by PCR NEGATIVE NEGATIVE Final    Comment: (NOTE) The Xpert Xpress SARS-CoV-2/FLU/RSV plus assay is intended as an aid in the diagnosis of influenza from Nasopharyngeal swab specimens and should not be used as a sole basis for treatment. Nasal washings and aspirates are unacceptable for Xpert Xpress SARS-CoV-2/FLU/RSV testing.  Fact Sheet for Patients: EntrepreneurPulse.com.au  Fact Sheet for Healthcare Providers: IncredibleEmployment.be  This test is not yet approved or cleared by the [place] FDA and has been authorized for detection and/or diagnosis of SARS-CoV-2 by FDA under an Emergency Use Authorization (EUA). This EUA will remain in effect (meaning this test can be used) for the duration of the COVID-19 declaration under Section 564(b)(1) of the Act, 21 U.S.C. section 360bbb-3(b)(1), unless the authorization is terminated or revoked.  Performed at [facility], [place] [address], [place], [place] [address]           Radiology Studies: No results found.      Scheduled Meds:  acetaminophen  500 mg Oral TID   aspirin  81 mg Oral Daily   cefdinir  300 mg Oral Daily   ferrous [id number] C-folic acid  1 capsule Oral QPC breakfast   hydrALAZINE  50 mg Oral TID   insulin aspart  0-9 Units Subcutaneous TID WC   insulin glargine-yfgn  15 Units Subcutaneous QHS   metoprolol  tartrate  12.5 mg Oral BID   oxybutynin  5 mg Oral BID   pantoprazole  40 mg Oral Daily   sodium bicarbonate  650 mg Oral BID   verapamil  120 mg Oral QHS   Continuous Infusions:     LOS: 3 days    Time spent: 35 minutes    Irine Seal, MD [facility]   To contact the attending provider between 7A-7P or the covering provider during after hours 7P-7A, please log into the web site [email or web address] and access using  universal  password for that web site. If you do not have the password, please call the hospital operator.  04/03/2021, 12:09 PM

## 2021-04-03 NOTE — Progress Notes (Signed)
°   04/03/21 0426  Assess: MEWS Score  Temp 98.6 F (37 C)  BP (!) 223/93  Pulse Rate 93  Resp (!) 22  SpO2 99 %  O2 Device Room Air  Assess: MEWS Score  MEWS Temp 0  MEWS Systolic 2  MEWS Pulse 0  MEWS RR 1  MEWS LOC 0  MEWS Score 3  MEWS Score Color Yellow  Assess: if the MEWS score is Yellow or Red  Were vital signs taken at a resting state? Yes  Focused Assessment No change from prior assessment  Does the patient meet 2 or more of the SIRS criteria? No  MEWS guidelines implemented *See Row Information* Yes  Treat  MEWS Interventions Escalated (See documentation below)  Pain Scale 0-10  Pain Score 0  Take Vital Signs  Increase Vital Sign Frequency  Yellow: Q 2hr X 2 then Q 4hr X 2, if remains yellow, continue Q 4hrs  Escalate  MEWS: Escalate Yellow: discuss with charge nurse/RN and consider discussing with provider and RRT  Notify: Charge Nurse/RN  Name of Charge Nurse/RN Notified Raquel Sarna  Date Charge Nurse/RN Notified 04/03/21  Time Charge Nurse/RN Notified 0430  Notify: Provider  Provider Name/Title Dr. Marvel Plan  Date Provider Notified 04/03/21  Time Provider Notified [phone]  Notification Reason Change in status  Provider response See new orders  Date of Provider Response 04/03/21  Time of Provider Response 0428  Assess: SIRS CRITERIA  SIRS Temperature  0  SIRS Pulse 1  SIRS Respirations  1  SIRS WBC 0  SIRS Score Sum  2   Patient BP elevated. Confirmed with manual BP 222/95. Notified provider. Patient without IV access. IV team consulted. IV access established by [employer]. Patient notably shakey, BG checked, BG 136. PO Hydralazine 50mg  given. Provider made aware IV access established. Orders to recheck BP in 17mins and notify provider of results.

## 2021-04-04 LAB — GLUCOSE, CAPILLARY
Glucose-Capillary: 129 mg/dL — ABNORMAL HIGH (ref 70–99)
Glucose-Capillary: 106 mg/dL — ABNORMAL HIGH (ref 70–99)
Glucose-Capillary: 123 mg/dL — ABNORMAL HIGH (ref 70–99)
Glucose-Capillary: 122 mg/dL — ABNORMAL HIGH (ref 70–99)

## 2021-04-04 LAB — BASIC METABOLIC PANEL WITH GFR
Anion gap: 9 (ref 5–15)
BUN: 23 mg/dL (ref 8–23)
CO2: 21 mmol/L — ABNORMAL LOW (ref 22–32)
Calcium: 8.8 mg/dL — ABNORMAL LOW (ref 8.9–10.3)
Chloride: 107 mmol/L (ref 98–111)
Creatinine, Ser: 1.32 mg/dL — ABNORMAL HIGH (ref 0.44–1.00)
GFR, Estimated: 39 mL/min — ABNORMAL LOW
Glucose, Bld: 74 mg/dL (ref 70–99)
Potassium: 3.8 mmol/L (ref 3.5–5.1)
Sodium: 137 mmol/L (ref 135–145)

## 2021-04-04 LAB — CBC
HCT: 35.4 % — ABNORMAL LOW (ref 36.0–46.0)
Hemoglobin: 10.9 g/dL — ABNORMAL LOW (ref 12.0–15.0)
MCH: 26.8 pg (ref 26.0–34.0)
MCHC: 30.8 g/dL (ref 30.0–36.0)
MCV: 87.2 fL (ref 80.0–100.0)
Platelets: 285 10*3/uL (ref 150–400)
RBC: 4.06 MIL/uL (ref 3.87–5.11)
RDW: 15.5 % (ref 11.5–15.5)
WBC: 7.1 10*3/uL (ref 4.0–10.5)
nRBC: 0 % (ref 0.0–0.2)

## 2021-04-04 LAB — URINE CULTURE: Culture: >=100000 — AB

## 2021-04-04 LAB — MAGNESIUM: Magnesium: 1.6 mg/dL — ABNORMAL LOW (ref 1.7–2.4)

## 2021-04-04 MED ORDER — INSULIN GLARGINE-YFGN 100 UNIT/ML ~~LOC~~ SOLN: 8.0000 [IU] | Freq: Every day | SUBCUTANEOUS | Status: DC

## 2021-04-04 MED ORDER — MAGNESIUM SULFATE 4 GM/100ML IV SOLN
4.0000 g | Freq: Once | INTRAVENOUS | Status: AC
  Administered 2021-04-04: 4 g via INTRAVENOUS
  Filled 2021-04-04: qty 100

## 2021-04-04 NOTE — Progress Notes (Signed)
PROGRESS NOTE    Lisa Villegas  BPZ:[id number] DOB: 29-Nov-1930 DOA: 03/30/2021 PCP: [name], MD    Chief Complaint  Patient presents with   Melena   decreased appetite    Brief Narrative:  Patient is a 88 year old female history of anemia, anxiety, chronic diastolic CHF, stage IIIa CKD, type 2 diabetes, diabetic neuropathy, thyroid nodule, osteopenia, history of nonhemorrhagic CVA sent from facility secondary to dark stools, decreased appetite, inability to walk due to injury to the right foot.  Patient noted to be taking 2 different iron supplementation pills.  Hemoglobin normal.  Work-up in the ED with urinalysis concerning for UTI.  Troponin is flattened but elevated at 49 and 47.  Chest x-ray unremarkable.  Plain films of the right foot with multiple metatarsal fractures.  Orthopedics consulted.  Patient placed on empiric IV antibiotics.   Assessment & Plan:   Principal Problem:   AKI (acute kidney injury) ([place]) Active Problems:   Type II diabetes mellitus with renal manifestations ([place])   Hypertension   Autism   HTN (hypertension), benign   CKD (chronic kidney disease), stage III ([facility])   Fracture of unsp metatarsal bone(s), right foot, init   Malnutrition of moderate degree   Multiple closed fractures of metatarsal bone of right foot  1 acute kidney injury on ?CKD stage IIIa -Likely secondary to prerenal azotemia due to poor oral intake. -Urinalysis done concerning for UTI. -Renal function fluctuating, likely secondary to poor oral intake. -Gentle hydration for the next 24 hours. -Supportive care.   2.  Hypomagnesemia -Magnesium at 1.6. -Magnesium sulfate 4 g IV x1. -Repeat labs in the AM. -Follow.    3.  Fracture of metatarsal bone, right foot -Patient seen in consultation by orthopedics who are recommending postop shoe, WBAT. -PT/OT has assessed patient and recommended home health therapies. -Patient was supposed to go back to assisted living  facility on 04/02/2021 however due to significant weakness and debility assisted living facility unable to accept patient and recommending SNF. -TOC consulted for SNF placement. -Outpatient follow-up with orthopedics.  4.  Hypertension -Continue current regimen of hydralazine, verapamil, Lopressor.    5.  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 -To have a blood glucose of 52 overnight at 9:53 PM (04/03/2021). - CBG 129 this morning.   -Discontinue Semglee.   -SSI.    6.  Proteus Mirabilis UTI/E. coli UTI -Urine cultures with > 100,000 colonies of Proteus Mirabilis, 50,000 colonies of E. coli. -Patient received IV ciprofloxacin and per pharmacy developed a rash and as such changed to IV Rocephin and subsequently transitioned to [facility].   -Culture sensitivities pending for E. coli.  7.  Anemia of chronic disease -Likely dilutional. -Patient with no overt bleeding. -Anemia panel consistent with anemia of chronic disease.  -H&H stable at 10.9 -Transfusion threshold hemoglobin < 7.  DVT prophylaxis: SCDs Code Status: DNR Family Communication: No family at bedside. Disposition:   Status is: Inpatient  The patient will require care spanning > 2 midnights and should be moved to inpatient because: Severity of illness      Consultants:  Orthopedics: Noemi Chapel, PA 03/30/2021  Procedures:  Plain films of the right foot 03/30/2021 Chest x-ray 03/30/2021   Antimicrobials:  IV Rocephin 03/31/2021>>>> 04/02/2021 IV ciprofloxacin 03/30/2021 x 1 dose (per pharmacy patient developed rash) Omnicef 04/02/2021>>>>   Subjective: Sleeping but arousable.  Alert.  Denies any chest pain.  No shortness of breath.  Seems somewhat drowsy.  CBGs noted  to be in the 50s early on this morning  Objective: Vitals:   04/03/21 1235 04/03/21 1645 04/03/21 2017 04/04/21 0458  BP: 129/66 132/64 (!) 120/93 (!) 170/95  Pulse: 68 70 81 74  Resp: 17 18 16 20   Temp: (!) 97.2 F  (36.2 C) 98 F (36.7 C) (!) 97.4 F (36.3 C) (!) 97.4 F (36.3 C)  TempSrc: Axillary  Oral Oral  SpO2: 98%  98% 97%  Weight:      Height:       No intake or output data in the 24 hours ending 04/04/21 1107  Filed Weights   04/01/21 1056  Weight: 58.4 kg    Examination:  General exam: NAD.  Pallor. Respiratory system: CTA B anterior lung fields.  No wheezes, no crackles, no rhonchi.  Normal respiratory effort.   Cardiovascular system: Regular rate rhythm no murmurs rubs or gallops.  No JVD.  No lower extremity edema. Gastrointestinal system: Abdomen is soft, nontender, nondistended, positive bowel sounds.  No rebound.  No guarding.  Central nervous system: Alert and oriented. No focal neurological deficits. Extremities: Right foot with bruising noted. Skin: No rashes, lesions or ulcers Psychiatry: Judgement and insight poor to fair. Mood & affect appropriate.     Data Reviewed: I have personally reviewed following labs and imaging studies  CBC: Recent Labs  Lab 03/30/21 1211 04/01/21 0553 04/02/21 0520 04/03/21 0548 04/04/21 0551  WBC 10.5 9.0 10.1 10.5 7.1  NEUTROABS 8.4* 6.3  --   --   --   HGB 13.3 10.7* 11.1* 12.1 10.9*  HCT 41.7 33.1* 35.7* 37.8 35.4*  MCV 85.1 84.7 87.5 84.4 87.2  PLT 317 244 272 310 285     Basic Metabolic Panel: Recent Labs  Lab 03/31/21 0517 04/01/21 0553 04/02/21 0520 04/03/21 0548 04/04/21 0551  NA 134* 131* 134* 133* 137  K 4.0 3.8 3.7 3.5 3.8  CL 103 102 107 105 107  CO2 22 20* 17* 21* 21*  GLUCOSE 111* 102* 86 113* 74  BUN 37* 29* 23 18 23   CREATININE 1.20* 1.19* 1.20* 0.89 1.32*  CALCIUM 8.6* 8.3* 8.3* 8.8* 8.8*  MG 1.4* 2.2 1.7  --  1.6*     GFR: Estimated Creatinine Clearance: 26.5 mL/min (A) (by C-G formula based on SCr of 1.32 mg/dL (H)).  Liver Function Tests: Recent Labs  Lab 03/30/21 1211  AST 13*  ALT 12  ALKPHOS 103  BILITOT 0.9  PROT 7.4  ALBUMIN 3.4*     CBG: Recent Labs  Lab  04/03/21 1143 04/03/21 1632 04/03/21 2146 04/03/21 2221 04/04/21 0816  GLUCAP 86 82 54* 116* 129*      Recent Results (from the past 240 hour(s))  Resp Panel by RT-PCR (Flu A&B, Covid) Nasopharyngeal Swab     Status: None   Collection Time: 03/30/21 11:00 AM   Specimen: Nasopharyngeal Swab; Nasopharyngeal(NP) swabs in vial transport medium  Result Value Ref Range Status   SARS Coronavirus 2 by RT PCR NEGATIVE NEGATIVE Final    Comment: (NOTE) SARS-CoV-2 target nucleic acids are NOT DETECTED.  The SARS-CoV-2 RNA is generally detectable in upper respiratory specimens during the acute phase of infection. The lowest concentration of SARS-CoV-2 viral copies this assay can detect is 138 copies/mL. A negative result does not preclude SARS-Cov-2 infection and should not be used as the sole basis for treatment or other patient management decisions. A negative result may occur with  improper specimen collection/handling, submission of specimen other than nasopharyngeal swab, presence  of viral mutation(s) within the areas targeted by this assay, and inadequate number of viral copies(<138 copies/mL). A negative result must be combined with clinical observations, patient history, and epidemiological information. The expected result is Negative.  Fact Sheet for Patients:  EntrepreneurPulse.com.au  Fact Sheet for Healthcare Providers:  IncredibleEmployment.be  This test is no t yet approved or cleared by the [place] FDA and  has been authorized for detection and/or diagnosis of SARS-CoV-2 by FDA under an Emergency Use Authorization (EUA). This EUA will remain  in effect (meaning this test can be used) for the duration of the COVID-19 declaration under Section 564(b)(1) of the Act, 21 U.S.C.section 360bbb-3(b)(1), unless the authorization is terminated  or revoked sooner.       Influenza A by PCR NEGATIVE NEGATIVE Final   Influenza B by PCR  NEGATIVE NEGATIVE Final    Comment: (NOTE) The Xpert Xpress SARS-CoV-2/FLU/RSV plus assay is intended as an aid in the diagnosis of influenza from Nasopharyngeal swab specimens and should not be used as a sole basis for treatment. Nasal washings and aspirates are unacceptable for Xpert Xpress SARS-CoV-2/FLU/RSV testing.  Fact Sheet for Patients: EntrepreneurPulse.com.au  Fact Sheet for Healthcare Providers: IncredibleEmployment.be  This test is not yet approved or cleared by the [place] FDA and has been authorized for detection and/or diagnosis of SARS-CoV-2 by FDA under an Emergency Use Authorization (EUA). This EUA will remain in effect (meaning this test can be used) for the duration of the COVID-19 declaration under Section 564(b)(1) of the Act, 21 U.S.C. section 360bbb-3(b)(1), unless the authorization is terminated or revoked.  Performed at [facility], [place] [address] [place], [place] [address]   Urine Culture     Status: Abnormal (Preliminary result)   Collection Time: 03/30/21  4:04 PM   Specimen: Urine, Clean Catch  Result Value Ref Range Status   Specimen Description   Final    URINE, CLEAN CATCH Performed at [facility], [place] [address] [place], [place] [address]    Special Requests   Final    NONE Performed at [facility], [place] [address], [place], [place] [address]    Culture (A)  Final    >=100,000 COLONIES/mL PROTEUS MIRABILIS 50,000 COLONIES/mL ESCHERICHIA COLI REPEATING SENSITIVITIES Performed at [facility], [place] [address] [place],  [address]    Report Status PENDING  Incomplete   Organism ID, Bacteria PROTEUS MIRABILIS (A)  Final      Susceptibility   Proteus mirabilis - MIC*    AMPICILLIN <=2 SENSITIVE Sensitive     CEFAZOLIN <=4 SENSITIVE Sensitive     CEFEPIME <=0.12 SENSITIVE Sensitive     CEFTRIAXONE <=0.25 SENSITIVE Sensitive      CIPROFLOXACIN <=0.25 SENSITIVE Sensitive     GENTAMICIN <=1 SENSITIVE Sensitive     IMIPENEM 2 SENSITIVE Sensitive     NITROFURANTOIN 128 RESISTANT Resistant     TRIMETH/SULFA <=20 SENSITIVE Sensitive     AMPICILLIN/SULBACTAM <=2 SENSITIVE Sensitive     PIP/TAZO <=4 SENSITIVE Sensitive     * >=100,000 COLONIES/mL PROTEUS MIRABILIS  MRSA Next Gen by PCR, Nasal     Status: None   Collection Time: 03/31/21  5:32 PM   Specimen: Nasal Mucosa; Nasal Swab  Result Value Ref Range Status   MRSA by PCR Next Gen NOT DETECTED NOT DETECTED Final    Comment: (NOTE) The GeneXpert MRSA Assay (FDA approved for NASAL specimens only), is one component of a comprehensive MRSA colonization  surveillance program. It is not intended to diagnose MRSA infection nor to guide or monitor treatment for MRSA infections. Test performance is not FDA approved in patients less than 71 years old. Performed at [facility], [place] [address], [place], [place] [address]   Resp Panel by RT-PCR (Flu A&B, Covid) Nasopharyngeal Swab     Status: None   Collection Time: 04/02/21 12:08 PM   Specimen: Nasopharyngeal Swab; Nasopharyngeal(NP) swabs in vial transport medium  Result Value Ref Range Status   SARS Coronavirus 2 by RT PCR NEGATIVE NEGATIVE Final    Comment: (NOTE) SARS-CoV-2 target nucleic acids are NOT DETECTED.  The SARS-CoV-2 RNA is generally detectable in upper respiratory specimens during the acute phase of infection. The lowest concentration of SARS-CoV-2 viral copies this assay can detect is 138 copies/mL. A negative result does not preclude SARS-Cov-2 infection and should not be used as the sole basis for treatment or other patient management decisions. A negative result may occur with  improper specimen collection/handling, submission of specimen other than nasopharyngeal swab, presence of viral mutation(s) within the areas targeted by this assay, and inadequate number of  viral copies(<138 copies/mL). A negative result must be combined with clinical observations, patient history, and epidemiological information. The expected result is Negative.  Fact Sheet for Patients:  EntrepreneurPulse.com.au  Fact Sheet for Healthcare Providers:  IncredibleEmployment.be  This test is no t yet approved or cleared by the [place] FDA and  has been authorized for detection and/or diagnosis of SARS-CoV-2 by FDA under an Emergency Use Authorization (EUA). This EUA will remain  in effect (meaning this test can be used) for the duration of the COVID-19 declaration under Section 564(b)(1) of the Act, 21 U.S.C.section 360bbb-3(b)(1), unless the authorization is terminated  or revoked sooner.       Influenza A by PCR NEGATIVE NEGATIVE Final   Influenza B by PCR NEGATIVE NEGATIVE Final    Comment: (NOTE) The Xpert Xpress SARS-CoV-2/FLU/RSV plus assay is intended as an aid in the diagnosis of influenza from Nasopharyngeal swab specimens and should not be used as a sole basis for treatment. Nasal washings and aspirates are unacceptable for Xpert Xpress SARS-CoV-2/FLU/RSV testing.  Fact Sheet for Patients: EntrepreneurPulse.com.au  Fact Sheet for Healthcare Providers: IncredibleEmployment.be  This test is not yet approved or cleared by the [place] FDA and has been authorized for detection and/or diagnosis of SARS-CoV-2 by FDA under an Emergency Use Authorization (EUA). This EUA will remain in effect (meaning this test can be used) for the duration of the COVID-19 declaration under Section 564(b)(1) of the Act, 21 U.S.C. section 360bbb-3(b)(1), unless the authorization is terminated or revoked.  Performed at [facility], [place] [address] [place], [place] [address]           Radiology Studies: No results found.      Scheduled Meds:  acetaminophen  500  mg Oral TID   aspirin  81 mg Oral Daily   cefdinir  300 mg Oral Daily   ferrous [id number] C-folic acid  1 capsule Oral QPC breakfast   hydrALAZINE  50 mg Oral TID   insulin aspart  0-9 Units Subcutaneous TID WC   metoprolol tartrate  12.5 mg Oral BID   oxybutynin  5 mg Oral BID   pantoprazole  40 mg Oral Daily   sodium bicarbonate  650 mg Oral BID   verapamil  120 mg Oral QHS   Continuous Infusions:  magnesium sulfate bolus IVPB 4 g (04/04/21  1048)      LOS: 4 days    Time spent: 35 minutes    Irine Seal, MD [facility]   To contact the attending provider between 7A-7P or the covering provider during after hours 7P-7A, please log into the web site [email or web address] and access using universal [place] password for that web site. If you do not have the password, please call the hospital operator.  04/04/2021, 11:07 AM

## 2021-04-05 LAB — MAGNESIUM: Magnesium: 2.2 mg/dL (ref 1.7–2.4)

## 2021-04-05 LAB — GLUCOSE, CAPILLARY
Glucose-Capillary: 132 mg/dL — ABNORMAL HIGH (ref 70–99)
Glucose-Capillary: 144 mg/dL — ABNORMAL HIGH (ref 70–99)
Glucose-Capillary: 131 mg/dL — ABNORMAL HIGH (ref 70–99)
Glucose-Capillary: 187 mg/dL — ABNORMAL HIGH (ref 70–99)

## 2021-04-05 LAB — CBC
HCT: 33.4 % — ABNORMAL LOW (ref 36.0–46.0)
Hemoglobin: 10.8 g/dL — ABNORMAL LOW (ref 12.0–15.0)
MCH: 27.8 pg (ref 26.0–34.0)
MCHC: 32.3 g/dL (ref 30.0–36.0)
MCV: 85.9 fL (ref 80.0–100.0)
Platelets: 298 10*3/uL (ref 150–400)
RBC: 3.89 MIL/uL (ref 3.87–5.11)
RDW: 15.7 % — ABNORMAL HIGH (ref 11.5–15.5)
WBC: 8.7 10*3/uL (ref 4.0–10.5)
nRBC: 0 % (ref 0.0–0.2)

## 2021-04-05 LAB — BASIC METABOLIC PANEL
Anion gap: 7 (ref 5–15)
BUN: 32 mg/dL — ABNORMAL HIGH (ref 8–23)
CO2: 22 mmol/L (ref 22–32)
Calcium: 8.7 mg/dL — ABNORMAL LOW (ref 8.9–10.3)
Chloride: 105 mmol/L (ref 98–111)
Creatinine, Ser: 1.75 mg/dL — ABNORMAL HIGH (ref 0.44–1.00)
GFR, Estimated: 28 mL/min — ABNORMAL LOW (ref >60)
Glucose, Bld: 137 mg/dL — ABNORMAL HIGH (ref 70–99)
Potassium: 4.1 mmol/L (ref 3.5–5.1)
Sodium: 134 mmol/L — ABNORMAL LOW (ref 135–145)

## 2021-04-05 MED ORDER — SODIUM CHLORIDE 0.9 % IV SOLN: INTRAVENOUS | Status: AC

## 2021-04-05 NOTE — TOC Progression Note (Addendum)
Transition of Care [facility]) - Progression Note    Patient Details  Name: Eleri Ruben MRN: [id number] Date of Birth: [birth year]  Transition of Care [facility]) CM/SW Graham, Dawn Phone Number: 04/05/2021, 12:13 PM  Clinical Narrative:   Patient worked up for SNF. Bed search initiated. TOC will continue to follow during the course of hospitalization.  Addendum:  Based on patient's unwillingness to work with therapy, asked [facility] POA [name] if she could come in to help with this process.  She let me know she can come on Wednesday after 11:00.  I communicated this with the PT who saw her today.  Also, [name] asked that I make sure to send her information to [facility] in [place] as she lives close by and could easily visit there. Info sent.      Expected Discharge Plan: Skilled Nursing Facility Barriers to Discharge: SNF Pending bed offer  Expected Discharge Plan and Services Expected Discharge Plan: [place] In-house Referral: Clinical Social Work     Living arrangements for the past 2 months: [place] Expected Discharge Date: 04/02/21                                     Social Determinants of Health (SDOH) Interventions    Readmission Risk Interventions Readmission Risk Prevention Plan 08/01/2019 04/02/2019  Transportation Screening Complete Complete  PCP or Specialist Appt within 3-5 Days Complete Complete  HRI or Parkway Complete Complete  Social Work Consult for [place] Planning/Counseling Complete Complete  Palliative Care Screening Complete Not Applicable  Medication Review Press photographer) Complete Referral to Pharmacy  Some recent data might be hidden

## 2021-04-05 NOTE — Progress Notes (Signed)
Physical Therapy Treatment Patient Details Name: Lisa Villegas MRN: [id number] DOB: 1930/07/24 Today's Date: 04/05/2021   History of Present Illness Lisa Villegas is a 88 y.o. female  admitted on  03/30/21  due to dark stools, decreased appetite and inability to walk due to injury on her right foot.  X-rays of the right foot demonstrate 1st digit proximal phalanx fracture, and minimally displaced fractures of the 3rd, 4th, and 5th metatarsal necks - ortho recommended post op shoe WBAT.  Pt with medical history significant of anemia, autism,anxiety, tuberculosis,  chronic diastolic heart failure, stage IIIa chronic kidney disease, type II DM, diabetic neuropathy, thyroid nodule, hypertension, hyperlipidemia, incontinence, osteopenia, history of other non-hemorrhagic stroke    PT Comments    Pt remains limited with mobility progress with continued requirement of +2 assist for bed mobility due to decreased command following and requiring MAX encouragement for participation, pt very adamant about returning to supine and staying in bed pulling blankets up over her, deferring additional mobility following supine to sit. Unable to progress to transfers this session. No caregiver present during session, pt may benefit from future session with caregiver present to potentially improve participation/pt motivation. Pt will benefit from continued skilled PT to increase independence and maximize safety with mobility.     Recommendations for follow up therapy are one component of a multi-disciplinary discharge planning process, led by the attending physician.  Recommendations may be updated based on patient status, additional functional criteria and insurance authorization.  Follow Up Recommendations  Skilled nursing-short term rehab (<3 hours/day)     Assistance Recommended at Discharge Frequent or constant Supervision/Assistance  Patient can return home with the following Two people to help with walking  and/or transfers;Two people to help with bathing/dressing/bathroom   Equipment Recommendations  None recommended by PT    Recommendations for Other Services       Precautions / Restrictions Precautions Precautions: Fall Precaution Comments: Autistic? Difficulty following directions Required Braces or Orthoses: Splint/Cast Splint/Cast: post op shoe on right foot Restrictions Weight Bearing Restrictions: Yes RLE Weight Bearing: Weight bearing as tolerated     Mobility  Bed Mobility Overal bed mobility: Needs Assistance Bed Mobility: Supine to Sit;Sit to Supine Rolling: Max assist;+2 for physical assistance   Supine to sit: +2 for physical assistance;Total assist Sit to supine: Mod assist;+2 for physical assistance   General bed mobility comments: Pt not following commands. Requiring max encouragement and total assist to sit EOB x2. Pt retropulsive and resisting against assist to maintain upright seated balance. Refusing performance of transfers. Pt with more participation with return to supine with initiation of LEs onto bed and reaching for bed rails and scooting hips to reposition once in supine. D+2 for scooting to [facility].    Transfers                        Ambulation/Gait                   Stairs             Wheelchair Mobility    Modified Rankin (Stroke Patients Only)       Balance Overall balance assessment: Needs assistance Sitting-balance support: Bilateral upper extremity supported Sitting balance-Leahy Scale: Poor Sitting balance - Comments: Pt requiring mod-max A, intermittently +2 but seems more related to pt pushing/leaning back to return to supine.  Cognition Arousal/Alertness: Awake/alert Behavior During Therapy: Restless Overall Cognitive Status: History of cognitive impairments - at baseline                                 General Comments: patient able to  follow simple commands intermittently. patient stating "no" to most questions asked. was able to verbalize " i want to lie back down in the bed" very clearly. and stating "I want to stay like this" and pulling blankets up over her shoulders when returned to supine.        Exercises      General Comments        Pertinent Vitals/Pain Pain Assessment: Faces Faces Pain Scale: Hurts a little bit Pain Location: pt unable to state location Pain Descriptors / Indicators: Other (Comment) (pt unable to state) Pain Intervention(s): Limited activity within patient's tolerance;Monitored during session;Repositioned    Home Living                          Prior Function            PT Goals (current goals can now be found in the care plan section) Acute Rehab PT Goals Patient Stated Goal: none stated PT Goal Formulation: Patient unable to participate in goal setting Time For Goal Achievement: 04/14/21 Potential to Achieve Goals: Fair Progress towards PT goals: Not progressing toward goals - comment (not progressing due to decreased command following and decreased motivation to participate)    Frequency    Min 2X/week      PT Plan Current plan remains appropriate    Co-evaluation PT/OT/SLP Co-Evaluation/Treatment: Yes Reason for Co-Treatment: Necessary to address cognition/behavior during functional activity;For patient/therapist safety PT goals addressed during session: Mobility/safety with mobility OT goals addressed during session: ADL's and self-care      AM-PAC PT "6 Clicks" Mobility   Outcome Measure  Help needed turning from your back to your side while in a flat bed without using bedrails?: A Lot Help needed moving from lying on your back to sitting on the side of a flat bed without using bedrails?: Total Help needed moving to and from a bed to a chair (including a wheelchair)?: Total Help needed standing up from a chair using your arms (e.g., wheelchair or  bedside chair)?: Total Help needed to walk in hospital room?: Total Help needed climbing 3-5 steps with a railing? : Total 6 Click Score: 7    End of Session   Activity Tolerance: Other (comment) (limited due to cognitive impairments, motivation- pt wanting to return to supine) Patient left: in bed;with call bell/phone within reach;with bed alarm set Nurse Communication: Mobility status (notified that pt IV site looks potentially infiltrated) PT Visit Diagnosis: History of falling (Z91.81);Other abnormalities of gait and mobility (R26.89)     Time: 1025-1036 PT Time Calculation (min) (ACUTE ONLY): 11 min  Charges:  $Therapeutic Activity: 8-22 mins                     Festus Barren PT, DPT  Acute Rehabilitation Services  Office [phone]  04/05/2021, 2:55 PM

## 2021-04-05 NOTE — Progress Notes (Signed)
Occupational Therapy Treatment Patient Details Name: Lisa Villegas MRN: [id number] DOB: 04-15-1930 Today's Date: 04/05/2021   History of present illness Kaycie Pegues is a 88 y.o. female  admitted on  03/30/21  due to dark stools, decreased appetite and inability to walk due to injury on her right foot.  X-rays of the right foot demonstrate 1st digit proximal phalanx fracture, and minimally displaced fractures of the 3rd, 4th, and 5th metatarsal necks - ortho recommended post op shoe WBAT.  Pt with medical history significant of anemia, autism,anxiety, tuberculosis,  chronic diastolic heart failure, stage IIIa chronic kidney disease, type II DM, diabetic neuropathy, thyroid nodule, hypertension, hyperlipidemia, incontinence, osteopenia, history of other non-hemorrhagic stroke   OT comments  Patient was noted to continue to require +2 for sitting on edge of bed with max encouragement to participate in task. Patient was highly motivated to get back into bed with covers over head during session.  Patient caregivers were not present during session. Might need to schedule session with them present for motivation or to gain insight to patient to increase participation in session. Patient would continue to benefit from skilled OT services at this time while admitted and after d/c to address noted deficits in order to improve overall safety and independence in ADLs.     Recommendations for follow up therapy are one component of a multi-disciplinary discharge planning process, led by the attending physician.  Recommendations may be updated based on patient status, additional functional criteria and insurance authorization.    Follow Up Recommendations  Skilled nursing-short term rehab (<3 hours/day)    Assistance Recommended at Discharge Frequent or constant Supervision/Assistance  Patient can return home with the following  A lot of help with walking and/or transfers;A lot of help with  bathing/dressing/bathroom   Equipment Recommendations  None recommended by OT    Recommendations for Other Services      Precautions / Restrictions Precautions Precautions: Fall Precaution Comments: Autistic? Difficulty following directions Required Braces or Orthoses: Splint/Cast Splint/Cast: post op shoe on right Restrictions Weight Bearing Restrictions: Yes RLE Weight Bearing: Weight bearing as tolerated       Mobility Bed Mobility Overal bed mobility: Needs Assistance   Rolling: Max assist;+2 for physical assistance   Supine to sit: +2 for physical assistance;Total assist     General bed mobility comments: Pt not following commands.  Requiring max encouragement and total assist to sit EOB . Pt pushing back to lay down    Transfers                         Balance                                           ADL either performed or assessed with clinical judgement   ADL Overall ADL's : Needs assistance/impaired                                       General ADL Comments: patient declined to paritcipate in ADLs with max A x 2 for sitting midline on edge of bed with patient noted to have strong posterior leaning with sitting attempts. patient declined to particiapte in more activity preforming sit to supine with minguard with patient then pulling blankets over her  head. communicated with nursing patients particiaption levels.    Extremity/Trunk Assessment Upper Extremity Assessment LUE Deficits / Details: noted to have infiltrated IV with redness proximal to IV side and edema. nursing made aware            Vision       Perception     Praxis      Cognition Arousal/Alertness: Awake/alert Behavior During Therapy: Restless Overall Cognitive Status: History of cognitive impairments - at baseline                                 General Comments: patient able to follow simple commands. patient reported  "no" to most questions asked. was able to verbalie " i want to lie back down in the bed" very clearly.          Exercises     Shoulder Instructions       General Comments      Pertinent Vitals/ Pain       Pain Assessment: Faces Faces Pain Scale: Hurts a little bit Pain Location: unable to determine location of pain Pain Intervention(s): Monitored during session  Home Living                                          Prior Functioning/Environment              Frequency  Min 2X/week        Progress Toward Goals  OT Goals(current goals can now be found in the care plan section)  Progress towards OT goals: Not progressing toward goals - comment     Plan Discharge plan needs to be updated    Co-evaluation    PT/OT/SLP Co-Evaluation/Treatment: Yes Reason for Co-Treatment: For patient/therapist safety;Necessary to address cognition/behavior during functional activity PT goals addressed during session: Mobility/safety with mobility OT goals addressed during session: ADL's and self-care      AM-PAC OT "6 Clicks" Daily Activity     Outcome Measure   Help from another person eating meals?: A Little Help from another person taking care of personal grooming?: A Little Help from another person toileting, which includes using toliet, bedpan, or urinal?: Total Help from another person bathing (including washing, rinsing, drying)?: A Lot Help from another person to put on and taking off regular upper body clothing?: A Lot Help from another person to put on and taking off regular lower body clothing?: Total 6 Click Score: 12    End of Session    OT Visit Diagnosis: Other symptoms and signs involving cognitive function;History of falling (Z91.81)   Activity Tolerance Treatment limited secondary to agitation   Patient Left in bed;with call bell/phone within reach;with bed alarm set   Nurse Communication Mobility status        Time:  7425-9563 OT Time Calculation (min): 19 min  Charges: OT General Charges $OT Visit: 1 Visit OT Treatments $Therapeutic Activity: 8-22 mins  Jackelyn Poling OTR/L, MS Acute Rehabilitation Department Office# [phone] Pager# [phone]   Marcellina Millin 04/05/2021, 12:25 PM
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
## 2021-04-05 NOTE — Care Management Important Message (Signed)
Important Message  Patient Details IM Letter placed in Patients room. Name: Lisa Villegas MRN: [id number] Date of Birth: Sep 01, 1930   Medicare Important Message Given:  Yes     Kerin Salen 04/05/2021, 1:14 PM

## 2021-04-05 NOTE — Consult Note (Signed)
[facility] CM Inpatient Consult   04/05/2021  Lisa Villegas 02/28/1931 [id number]  [place] [facility] CM)  Patient chart reviewed due to high risk score for readmission. Per review, current recommendation is for skilled nursing facility. Will continue to follow for progression and disposition plans.  Of note, [facility] Care Management services does not replace or interfere with any services that are arranged by inpatient case management or social work.   [name], MSN, RN [facility] [employer] [phone]  Toll free office [phone]

## 2021-04-05 NOTE — Progress Notes (Signed)
PROGRESS NOTE    Lisa Villegas  MOQ:[id number] DOB: 22-Feb-1931 DOA: 03/30/2021 PCP: [name], MD    Chief Complaint  Patient presents with   Melena   decreased appetite    Brief Narrative:  Patient is a 88 year old female history of anemia, anxiety, chronic diastolic CHF, stage IIIa CKD, type 2 diabetes, diabetic neuropathy, thyroid nodule, osteopenia, history of nonhemorrhagic CVA sent from facility secondary to dark stools, decreased appetite, inability to walk due to injury to the right foot.  Patient noted to be taking 2 different iron supplementation pills.  Hemoglobin normal.  Work-up in the ED with urinalysis concerning for UTI.  Troponin is flattened but elevated at 49 and 47.  Chest x-ray unremarkable.  Plain films of the right foot with multiple metatarsal fractures.  Orthopedics consulted.  Patient placed on empiric IV antibiotics.   Assessment & Plan:   Principal Problem:   AKI (acute kidney injury) ([place]) Active Problems:   Type II diabetes mellitus with renal manifestations ([place])   Hypertension   Autism   HTN (hypertension), benign   CKD (chronic kidney disease), stage III ([facility])   Fracture of unsp metatarsal bone(s), right foot, init   Malnutrition of moderate degree   Multiple closed fractures of metatarsal bone of right foot  1 acute kidney injury on ?CKD stage IIIa -Likely secondary to prerenal azotemia due to poor oral intake. -Urinalysis done concerning for UTI. -Renal function fluctuating, likely secondary to poor oral intake. -Creatinine trending up. -Place on IV fluids. -Supportive care.  2.  Hypomagnesemia -Repleted.   -Magnesium at 2.2.    3.  Fracture of metatarsal bone, right foot -Patient seen in consultation by orthopedics who are recommending postop shoe, WBAT. -PT/OT has assessed patient and recommended home health therapies. -Patient was supposed to go back to assisted living facility on 04/02/2021 however due to significant  weakness and debility assisted living facility unable to accept patient and recommending SNF. -TOC consulted for SNF placement. -Outpatient follow-up with orthopedics.  4.  Hypertension -Lopressor, verapamil, hydralazine.     5.  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 -Noted have a blood glucose of 52 at 9:53 PM (04/03/2021). -CBG 132 this morning.   -Long-acting insulin of [place] discontinued.   -SSI.   6.  Proteus Mirabilis UTI/E. coli UTI -Urine cultures with > 100,000 colonies of Proteus Mirabilis, 50,000 colonies of E. coli. -Patient received IV ciprofloxacin and per pharmacy developed a rash and as such changed to IV Rocephin and subsequently transitioned to [facility].   -Antibiotic day #7, will discontinue Omnicef after today's doses are given.  7.  Anemia of chronic disease -Likely dilutional. -Patient with no overt bleeding. -Anemia panel consistent with anemia of chronic disease.  -Hemoglobin stable at 10.8. -Transfusion threshold hemoglobin < 7.  DVT prophylaxis: SCDs Code Status: DNR Family Communication: No family at bedside. Disposition: Awaiting SNF placement  Status is: Inpatient  The patient will require care spanning > 2 midnights and should be moved to inpatient because: Severity of illness      Consultants:  Orthopedics: Noemi Chapel, PA 03/30/2021  Procedures:  Plain films of the right foot 03/30/2021 Chest x-ray 03/30/2021   Antimicrobials:  IV Rocephin 03/31/2021>>>> 04/02/2021 IV ciprofloxacin 03/30/2021 x 1 dose (per pharmacy patient developed rash) Omnicef 04/02/2021>>>> 04/05/2021   Subjective: Laying in bed.  More alert today.  No chest pain.  No shortness of breath.  Awaiting for IV to be replaced.    Objective:  Vitals:   04/04/21 0458 04/04/21 1503 04/04/21 2041 04/05/21 0558  BP: (!) 170/95 125/64 (!) 145/66 (!) 134/107  Pulse: 74 77 76 72  Resp: 20 20 20 16   Temp: (!) 97.4 F (36.3 C) 98.4 F (36.9 C) 98.6  F (37 C) 97.8 F (36.6 C)  TempSrc: Oral  Oral Oral  SpO2: 97% 99% 99% 99%  Weight:      Height:        Intake/Output Summary (Last 24 hours) at 04/05/2021 1137 Last data filed at 04/05/2021 0603 Gross per 24 hour  Intake --  Output 300 ml  Net -300 ml    Filed Weights   04/01/21 1056  Weight: 58.4 kg    Examination:  General exam: NAD. Respiratory system: Lungs clear to auscultation bilaterally anterior lung fields.  No wheezes, no crackles, no rhonchi.  Normal respiratory effort.  Speaking in full sentences.    Cardiovascular system: RRR no murmurs rubs or gallops.  No JVD.  No lower extremity edema. Gastrointestinal system: Abdomen is soft, nontender, nondistended, positive bowel sounds.  No rebound.  No guarding.   Central nervous system: Alert and oriented. No focal neurological deficits. Extremities: Right foot with bruising noted. Skin: No rashes, lesions or ulcers Psychiatry: Judgement and insight poor to fair. Mood & affect appropriate.     Data Reviewed: I have personally reviewed following labs and imaging studies  CBC: Recent Labs  Lab 03/30/21 1211 04/01/21 0553 04/02/21 0520 04/03/21 0548 04/04/21 0551 04/05/21 0501  WBC 10.5 9.0 10.1 10.5 7.1 8.7  NEUTROABS 8.4* 6.3  --   --   --   --   HGB 13.3 10.7* 11.1* 12.1 10.9* 10.8*  HCT 41.7 33.1* 35.7* 37.8 35.4* 33.4*  MCV 85.1 84.7 87.5 84.4 87.2 85.9  PLT 317 244 272 310 285 298     Basic Metabolic Panel: Recent Labs  Lab 03/31/21 0517 04/01/21 0553 04/02/21 0520 04/03/21 0548 04/04/21 0551 04/05/21 0501  NA 134* 131* 134* 133* 137 134*  K 4.0 3.8 3.7 3.5 3.8 4.1  CL 103 102 107 105 107 105  CO2 22 20* 17* 21* 21* 22  GLUCOSE 111* 102* 86 113* 74 137*  BUN 37* 29* 23 18 23  32*  CREATININE 1.20* 1.19* 1.20* 0.89 1.32* 1.75*  CALCIUM 8.6* 8.3* 8.3* 8.8* 8.8* 8.7*  MG 1.4* 2.2 1.7  --  1.6* 2.2     GFR: Estimated Creatinine Clearance: 20 mL/min (A) (by C-G formula based on SCr of 1.75  mg/dL (H)).  Liver Function Tests: Recent Labs  Lab 03/30/21 1211  AST 13*  ALT 12  ALKPHOS 103  BILITOT 0.9  PROT 7.4  ALBUMIN 3.4*     CBG: Recent Labs  Lab 04/04/21 0816 04/04/21 1219 04/04/21 1643 04/04/21 2203 04/05/21 0747  GLUCAP 129* 106* 123* 122* 132*      Recent Results (from the past 240 hour(s))  Resp Panel by RT-PCR (Flu A&B, Covid) Nasopharyngeal Swab     Status: None   Collection Time: 03/30/21 11:00 AM   Specimen: Nasopharyngeal Swab; Nasopharyngeal(NP) swabs in vial transport medium  Result Value Ref Range Status   SARS Coronavirus 2 by RT PCR NEGATIVE NEGATIVE Final    Comment: (NOTE) SARS-CoV-2 target nucleic acids are NOT DETECTED.  The SARS-CoV-2 RNA is generally detectable in upper respiratory specimens during the acute phase of infection. The lowest concentration of SARS-CoV-2 viral copies this assay can detect is 138 copies/mL. A negative result does not preclude SARS-Cov-2  infection and should not be used as the sole basis for treatment or other patient management decisions. A negative result may occur with  improper specimen collection/handling, submission of specimen other than nasopharyngeal swab, presence of viral mutation(s) within the areas targeted by this assay, and inadequate number of viral copies(<138 copies/mL). A negative result must be combined with clinical observations, patient history, and epidemiological information. The expected result is Negative.  Fact Sheet for Patients:  EntrepreneurPulse.com.au  Fact Sheet for Healthcare Providers:  IncredibleEmployment.be  This test is no t yet approved or cleared by the [place] FDA and  has been authorized for detection and/or diagnosis of SARS-CoV-2 by FDA under an Emergency Use Authorization (EUA). This EUA will remain  in effect (meaning this test can be used) for the duration of the COVID-19 declaration under Section 564(b)(1)  of the Act, 21 U.S.C.section 360bbb-3(b)(1), unless the authorization is terminated  or revoked sooner.       Influenza A by PCR NEGATIVE NEGATIVE Final   Influenza B by PCR NEGATIVE NEGATIVE Final    Comment: (NOTE) The Xpert Xpress SARS-CoV-2/FLU/RSV plus assay is intended as an aid in the diagnosis of influenza from Nasopharyngeal swab specimens and should not be used as a sole basis for treatment. Nasal washings and aspirates are unacceptable for Xpert Xpress SARS-CoV-2/FLU/RSV testing.  Fact Sheet for Patients: EntrepreneurPulse.com.au  Fact Sheet for Healthcare Providers: IncredibleEmployment.be  This test is not yet approved or cleared by the [place] FDA and has been authorized for detection and/or diagnosis of SARS-CoV-2 by FDA under an Emergency Use Authorization (EUA). This EUA will remain in effect (meaning this test can be used) for the duration of the COVID-19 declaration under Section 564(b)(1) of the Act, 21 U.S.C. section 360bbb-3(b)(1), unless the authorization is terminated or revoked.  Performed at [facility], [employer] [address], [place], [place] [address]   Urine Culture     Status: Abnormal   Collection Time: 03/30/21  4:04 PM   Specimen: Urine, Clean Catch  Result Value Ref Range Status   Specimen Description   Final    URINE, CLEAN CATCH Performed at [facility], [place] [address] [place], [place] [address]    Special Requests   Final    NONE Performed at [facility], [place] [address], [place], [place] [address]    Culture (A)  Final    >=100,000 COLONIES/mL PROTEUS MIRABILIS 50,000 COLONIES/mL ESCHERICHIA COLI    Report Status 04/04/2021 FINAL  Final   Organism ID, Bacteria PROTEUS MIRABILIS (A)  Final   Organism ID, Bacteria ESCHERICHIA COLI (A)  Final      Susceptibility   Escherichia coli - MIC*    AMPICILLIN <=2 SENSITIVE Sensitive      CEFAZOLIN <=4 SENSITIVE Sensitive     CEFEPIME <=0.12 SENSITIVE Sensitive     CEFTRIAXONE <=0.25 SENSITIVE Sensitive     CIPROFLOXACIN <=0.25 SENSITIVE Sensitive     GENTAMICIN <=1 SENSITIVE Sensitive     IMIPENEM <=0.25 SENSITIVE Sensitive     NITROFURANTOIN <=16 SENSITIVE Sensitive     TRIMETH/SULFA <=20 SENSITIVE Sensitive     AMPICILLIN/SULBACTAM <=2 SENSITIVE Sensitive     PIP/TAZO <=4 SENSITIVE Sensitive     * 50,000 COLONIES/mL ESCHERICHIA COLI   Proteus mirabilis - MIC*    AMPICILLIN <=2 SENSITIVE Sensitive     CEFAZOLIN <=4 SENSITIVE Sensitive     CEFEPIME <=0.12 SENSITIVE Sensitive     CEFTRIAXONE <=0.25 SENSITIVE Sensitive  CIPROFLOXACIN <=0.25 SENSITIVE Sensitive     GENTAMICIN <=1 SENSITIVE Sensitive     IMIPENEM 2 SENSITIVE Sensitive     NITROFURANTOIN 128 RESISTANT Resistant     TRIMETH/SULFA <=20 SENSITIVE Sensitive     AMPICILLIN/SULBACTAM <=2 SENSITIVE Sensitive     PIP/TAZO <=4 SENSITIVE Sensitive     * >=100,000 COLONIES/mL PROTEUS MIRABILIS  MRSA Next Gen by PCR, Nasal     Status: None   Collection Time: 03/31/21  5:32 PM   Specimen: Nasal Mucosa; Nasal Swab  Result Value Ref Range Status   MRSA by PCR Next Gen NOT DETECTED NOT DETECTED Final    Comment: (NOTE) The GeneXpert MRSA Assay (FDA approved for NASAL specimens only), is one component of a comprehensive MRSA colonization surveillance program. It is not intended to diagnose MRSA infection nor to guide or monitor treatment for MRSA infections. Test performance is not FDA approved in patients less than 63 years old. Performed at [facility], [place] [address] [place], [place] [address]   Resp Panel by RT-PCR (Flu A&B, Covid) Nasopharyngeal Swab     Status: None   Collection Time: 04/02/21 12:08 PM   Specimen: Nasopharyngeal Swab; Nasopharyngeal(NP) swabs in vial transport medium  Result Value Ref Range Status   SARS Coronavirus 2 by RT PCR NEGATIVE NEGATIVE Final    Comment:  (NOTE) SARS-CoV-2 target nucleic acids are NOT DETECTED.  The SARS-CoV-2 RNA is generally detectable in upper respiratory specimens during the acute phase of infection. The lowest concentration of SARS-CoV-2 viral copies this assay can detect is 138 copies/mL. A negative result does not preclude SARS-Cov-2 infection and should not be used as the sole basis for treatment or other patient management decisions. A negative result may occur with  improper specimen collection/handling, submission of specimen other than nasopharyngeal swab, presence of viral mutation(s) within the areas targeted by this assay, and inadequate number of viral copies(<138 copies/mL). A negative result must be combined with clinical observations, patient history, and epidemiological information. The expected result is Negative.  Fact Sheet for Patients:  EntrepreneurPulse.com.au  Fact Sheet for Healthcare Providers:  IncredibleEmployment.be  This test is no t yet approved or cleared by the [place] FDA and  has been authorized for detection and/or diagnosis of SARS-CoV-2 by FDA under an Emergency Use Authorization (EUA). This EUA will remain  in effect (meaning this test can be used) for the duration of the COVID-19 declaration under Section 564(b)(1) of the Act, 21 U.S.C.section 360bbb-3(b)(1), unless the authorization is terminated  or revoked sooner.       Influenza A by PCR NEGATIVE NEGATIVE Final   Influenza B by PCR NEGATIVE NEGATIVE Final    Comment: (NOTE) The Xpert Xpress SARS-CoV-2/FLU/RSV plus assay is intended as an aid in the diagnosis of influenza from Nasopharyngeal swab specimens and should not be used as a sole basis for treatment. Nasal washings and aspirates are unacceptable for Xpert Xpress SARS-CoV-2/FLU/RSV testing.  Fact Sheet for Patients: EntrepreneurPulse.com.au  Fact Sheet for Healthcare  Providers: IncredibleEmployment.be  This test is not yet approved or cleared by the [place] FDA and has been authorized for detection and/or diagnosis of SARS-CoV-2 by FDA under an Emergency Use Authorization (EUA). This EUA will remain in effect (meaning this test can be used) for the duration of the COVID-19 declaration under Section 564(b)(1) of the Act, 21 U.S.C. section 360bbb-3(b)(1), unless the authorization is terminated or revoked.  Performed at [facility], [address] [place], [place]  [place]           Radiology Studies: No results found.      Scheduled Meds:  acetaminophen  500 mg Oral TID   aspirin  81 mg Oral Daily   cefdinir  300 mg Oral Daily   ferrous [id number] C-folic acid  1 capsule Oral QPC breakfast   hydrALAZINE  50 mg Oral TID   insulin aspart  0-9 Units Subcutaneous TID WC   metoprolol tartrate  12.5 mg Oral BID   oxybutynin  5 mg Oral BID   pantoprazole  40 mg Oral Daily   sodium bicarbonate  650 mg Oral BID   verapamil  120 mg Oral QHS   Continuous Infusions:  sodium chloride 100 mL/hr at 04/05/21 0911      LOS: 5 days    Time spent: 35 minutes    Irine Seal, MD [facility]   To contact the attending provider between 7A-7P or the covering provider during after hours 7P-7A, please log into the web site [email or web address] and access using universal [place] password for that web site. If you do not have the password, please call the hospital operator.  04/05/2021, 11:37 AM

## 2021-04-06 LAB — BASIC METABOLIC PANEL
Anion gap: 8 (ref 5–15)
BUN: 25 mg/dL — ABNORMAL HIGH (ref 8–23)
CO2: 18 mmol/L — ABNORMAL LOW (ref 22–32)
Calcium: 8.4 mg/dL — ABNORMAL LOW (ref 8.9–10.3)
Chloride: 108 mmol/L (ref 98–111)
Creatinine, Ser: 1.34 mg/dL — ABNORMAL HIGH (ref 0.44–1.00)
GFR, Estimated: 38 mL/min — ABNORMAL LOW (ref >60)
Glucose, Bld: 184 mg/dL — ABNORMAL HIGH (ref 70–99)
Potassium: 4.8 mmol/L (ref 3.5–5.1)
Sodium: 134 mmol/L — ABNORMAL LOW (ref 135–145)

## 2021-04-06 LAB — GLUCOSE, CAPILLARY
Glucose-Capillary: 122 mg/dL — ABNORMAL HIGH (ref 70–99)
Glucose-Capillary: 135 mg/dL — ABNORMAL HIGH (ref 70–99)
Glucose-Capillary: 107 mg/dL — ABNORMAL HIGH (ref 70–99)
Glucose-Capillary: 146 mg/dL — ABNORMAL HIGH (ref 70–99)

## 2021-04-06 NOTE — TOC Progression Note (Signed)
Transition of Care [facility]) - Progression Note    Patient Details  Name: Lisa Villegas MRN: [id number] Date of Birth: Feb 02, 1931  Transition of Care [facility]) CM/SW [place],  Phone Number: 04/06/2021, 2:42 PM  Clinical Narrative:  Went over bed offers with Lisa Villegas.  She wants to do further research, contact family members for feedback, and will get back to me with choice tomorrow. TOC will continue to follow during the course of hospitalization.      Expected Discharge Plan: Skilled Nursing Facility Barriers to Discharge: SNF Pending bed offer  Expected Discharge Plan and Services Expected Discharge Plan: [place] In-house Referral: Clinical Social Work     Living arrangements for the past 2 months: [place] Expected Discharge Date: 04/02/21                                     Social Determinants of Health (SDOH) Interventions    Readmission Risk Interventions Readmission Risk Prevention Plan 08/01/2019 04/02/2019  Transportation Screening Complete Complete  PCP or Specialist Appt within 3-5 Days Complete Complete  HRI or [place] Complete Complete  Social Work Consult for [place] Planning/Counseling Complete Complete  Palliative Care Screening Complete Not Applicable  Medication Review [employer] photographer) Complete Referral to Pharmacy  Some recent data might be hidden

## 2021-04-06 NOTE — Progress Notes (Signed)
PROGRESS NOTE    Lisa Villegas  QAS:[id number] DOB: [birth year] DOA: 03/30/2021 PCP: [name], MD    Chief Complaint  Patient presents with   Melena   decreased appetite    Brief Narrative:  Patient is a 88 year old female history of anemia, anxiety, chronic diastolic CHF, stage IIIa CKD, type 2 diabetes, diabetic neuropathy, thyroid nodule, osteopenia, history of nonhemorrhagic CVA sent from facility secondary to dark stools, decreased appetite, inability to walk due to injury to the right foot.  Patient noted to be taking 2 different iron supplementation pills.  Hemoglobin normal.  Work-up in the ED with urinalysis concerning for UTI.  Troponin is flattened but elevated at 49 and 47.  Chest x-ray unremarkable.  Plain films of the right foot with multiple metatarsal fractures.  Orthopedics consulted.  Patient placed on empiric IV antibiotics.   Assessment & Plan:   Principal Problem:   AKI (acute kidney injury) ([place]) Active Problems:   Type II diabetes mellitus with renal manifestations ([place])   Hypertension   Autism   HTN (hypertension), benign   CKD (chronic kidney disease), stage III (HCC)   Fracture of unsp metatarsal bone(s), right foot, init   Malnutrition of moderate degree   Multiple closed fractures of metatarsal bone of right foot  1 acute kidney injury on ?CKD stage IIIa -Likely secondary to prerenal azotemia due to poor oral intake. -Urinalysis done concerning for UTI. -Renal function fluctuating, likely secondary to poor oral intake. -Creatinine trending up but improving with hydration.. -IV fluids for another 24 hours. -Supportive care.  2.  Hypomagnesemia -Repleted.   -Magnesium at 2.2.    3.  Fracture of metatarsal bone, right foot -Patient seen in consultation by orthopedics who are recommending postop shoe, WBAT. -PT/OT has assessed patient and recommended home health therapies. -Patient was supposed to go back to assisted living facility  on 04/02/2021 however due to significant weakness and debility assisted living facility unable to accept patient and recommending SNF. -TOC consulted for SNF placement. -Outpatient follow-up with orthopedics.  4.  Hypertension -Continue Lopressor, verapamil, hydralazine.     5.  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 -Noted have a blood glucose of 52 at 9:53 PM (04/03/2021). -CBG 122 this morning.   -SSI.   6.  Proteus Mirabilis UTI/E. coli UTI -Urine cultures with > 100,000 colonies of Proteus Mirabilis, 50,000 colonies of E. coli. -Patient received IV ciprofloxacin and per pharmacy developed a rash and as such changed to IV Rocephin and subsequently transitioned to [facility].   -Status post 7 days antibiotics.   -No further antibiotics needed.   7.  Anemia of chronic disease -Likely dilutional. -Patient with no overt bleeding. -Anemia panel consistent with anemia of chronic disease.  -Hemoglobin stable at 10.8. -Transfusion threshold hemoglobin < 7.  DVT prophylaxis: SCDs Code Status: DNR Family Communication: No family at bedside. Disposition: Awaiting SNF placement  Status is: Inpatient  The patient will require care spanning > 2 midnights and should be moved to inpatient because: Severity of illness      Consultants:  Orthopedics: Noemi Chapel, PA 03/30/2021  Procedures:  Plain films of the right foot 03/30/2021 Chest x-ray 03/30/2021   Antimicrobials:  IV Rocephin 03/31/2021>>>> 04/02/2021 IV ciprofloxacin 03/30/2021 x 1 dose (per pharmacy patient developed rash) Omnicef 04/02/2021>>>> 04/05/2021   Subjective: Laying in bed.  Alert.  Denies any chest pain.  No shortness of breath.    Objective: Vitals:   04/06/21 0411 04/06/21 0600  04/06/21 0658 04/06/21 1017  BP: (!) 194/62 (!) 187/88 (!) 174/62 (!) 162/71  Pulse: 67 65  72  Resp: 20     Temp: 97.8 F (36.6 C)     TempSrc: Axillary     SpO2: 98%     Weight:      Height:         Intake/Output Summary (Last 24 hours) at 04/06/2021 1258 Last data filed at 04/06/2021 0500 Gross per 24 hour  Intake 2925.17 ml  Output 650 ml  Net 2275.17 ml    Filed Weights   04/01/21 1056  Weight: 58.4 kg    Examination:  General exam: : NAD Respiratory system: CTA B.  No wheezes, no rhonchi.  Speaking in full sentences.  Normal respiratory effort. Cardiovascular system: Regular rate and rhythm no murmurs rubs or gallops.  No JVD.  No lower extremity edema.  Gastrointestinal system: Abdomen soft, nontender, nondistended, positive bowel sounds.  No rebound.  No guarding. Central nervous system: Alert and oriented. No focal neurological deficits. Extremities: Right foot with bruising noted. Skin: No rashes, lesions or ulcers Psychiatry: Judgement and insight appear normal. Mood & affect appropriate.   Data Reviewed: I have personally reviewed following labs and imaging studies  CBC: Recent Labs  Lab 04/01/21 0553 04/02/21 0520 04/03/21 0548 04/04/21 0551 04/05/21 0501  WBC 9.0 10.1 10.5 7.1 8.7  NEUTROABS 6.3  --   --   --   --   HGB 10.7* 11.1* 12.1 10.9* 10.8*  HCT 33.1* 35.7* 37.8 35.4* 33.4*  MCV 84.7 87.5 84.4 87.2 85.9  PLT 244 272 310 285 298     Basic Metabolic Panel: Recent Labs  Lab 03/31/21 0517 04/01/21 0553 04/02/21 0520 04/03/21 0548 04/04/21 0551 04/05/21 0501 04/06/21 0933  NA 134* 131* 134* 133* 137 134* 134*  K 4.0 3.8 3.7 3.5 3.8 4.1 4.8  CL 103 102 107 105 107 105 108  CO2 22 20* 17* 21* 21* 22 18*  GLUCOSE 111* 102* 86 113* 74 137* 184*  BUN 37* 29* 23 18 23  32* 25*  CREATININE 1.20* 1.19* 1.20* 0.89 1.32* 1.75* 1.34*  CALCIUM 8.6* 8.3* 8.3* 8.8* 8.8* 8.7* 8.4*  MG 1.4* 2.2 1.7  --  1.6* 2.2  --      GFR: Estimated Creatinine Clearance: 26.1 mL/min (A) (by C-G formula based on SCr of 1.34 mg/dL (H)).  Liver Function Tests: No results for input(s): AST, ALT, ALKPHOS, BILITOT, PROT, ALBUMIN in the last 168  hours.   CBG: Recent Labs  Lab 04/05/21 1200 04/05/21 1719 04/05/21 2023 04/06/21 0740 04/06/21 1122  GLUCAP 144* 131* 187* 122* 135*      Recent Results (from the past 240 hour(s))  Resp Panel by RT-PCR (Flu A&B, Covid) Nasopharyngeal Swab     Status: None   Collection Time: 03/30/21 11:00 AM   Specimen: Nasopharyngeal Swab; Nasopharyngeal(NP) swabs in vial transport medium  Result Value Ref Range Status   SARS Coronavirus 2 by RT PCR NEGATIVE NEGATIVE Final    Comment: (NOTE) SARS-CoV-2 target nucleic acids are NOT DETECTED.  The SARS-CoV-2 RNA is generally detectable in upper respiratory specimens during the acute phase of infection. The lowest concentration of SARS-CoV-2 viral copies this assay can detect is 138 copies/mL. A negative result does not preclude SARS-Cov-2 infection and should not be used as the sole basis for treatment or other patient management decisions. A negative result may occur with  improper specimen collection/handling, submission of specimen other than nasopharyngeal  swab, presence of viral mutation(s) within the areas targeted by this assay, and inadequate number of viral copies(<138 copies/mL). A negative result must be combined with clinical observations, patient history, and epidemiological information. The expected result is Negative.  Fact Sheet for Patients:  EntrepreneurPulse.com.au  Fact Sheet for Healthcare Providers:  IncredibleEmployment.be  This test is no t yet approved or cleared by the [place] FDA and  has been authorized for detection and/or diagnosis of SARS-CoV-2 by FDA under an Emergency Use Authorization (EUA). This EUA will remain  in effect (meaning this test can be used) for the duration of the COVID-19 declaration under Section 564(b)(1) of the Act, 21 U.S.C.section 360bbb-3(b)(1), unless the authorization is terminated  or revoked sooner.       Influenza A by PCR  NEGATIVE NEGATIVE Final   Influenza B by PCR NEGATIVE NEGATIVE Final    Comment: (NOTE) The Xpert Xpress SARS-CoV-2/FLU/RSV plus assay is intended as an aid in the diagnosis of influenza from Nasopharyngeal swab specimens and should not be used as a sole basis for treatment. Nasal washings and aspirates are unacceptable for Xpert Xpress SARS-CoV-2/FLU/RSV testing.  Fact Sheet for Patients: EntrepreneurPulse.com.au  Fact Sheet for Healthcare Providers: IncredibleEmployment.be  This test is not yet approved or cleared by the [place] FDA and has been authorized for detection and/or diagnosis of SARS-CoV-2 by FDA under an Emergency Use Authorization (EUA). This EUA will remain in effect (meaning this test can be used) for the duration of the COVID-19 declaration under Section 564(b)(1) of the Act, 21 U.S.C. section 360bbb-3(b)(1), unless the authorization is terminated or revoked.  Performed at [facility], [place] [address] [place], [place] [address]   Urine Culture     Status: Abnormal   Collection Time: 03/30/21  4:04 PM   Specimen: Urine, Clean Catch  Result Value Ref Range Status   Specimen Description   Final    URINE, CLEAN CATCH Performed at [facility], [place] [address] [place], [place] [address]    Special Requests   Final    NONE Performed at [facility], [place] [address] [place],  [address]    Culture (A)  Final    >=100,000 COLONIES/mL PROTEUS MIRABILIS 50,000 COLONIES/mL ESCHERICHIA COLI    Report Status 04/04/2021 FINAL  Final   Organism ID, Bacteria PROTEUS MIRABILIS (A)  Final   Organism ID, Bacteria ESCHERICHIA COLI (A)  Final      Susceptibility   Escherichia coli - MIC*    AMPICILLIN <=2 SENSITIVE Sensitive     CEFAZOLIN <=4 SENSITIVE Sensitive     CEFEPIME <=0.12 SENSITIVE Sensitive     CEFTRIAXONE <=0.25 SENSITIVE Sensitive     CIPROFLOXACIN  <=0.25 SENSITIVE Sensitive     GENTAMICIN <=1 SENSITIVE Sensitive     IMIPENEM <=0.25 SENSITIVE Sensitive     NITROFURANTOIN <=16 SENSITIVE Sensitive     TRIMETH/SULFA <=20 SENSITIVE Sensitive     AMPICILLIN/SULBACTAM <=2 SENSITIVE Sensitive     PIP/TAZO <=4 SENSITIVE Sensitive     * 50,000 COLONIES/mL ESCHERICHIA COLI   Proteus mirabilis - MIC*    AMPICILLIN <=2 SENSITIVE Sensitive     CEFAZOLIN <=4 SENSITIVE Sensitive     CEFEPIME <=0.12 SENSITIVE Sensitive     CEFTRIAXONE <=0.25 SENSITIVE Sensitive     CIPROFLOXACIN <=0.25 SENSITIVE Sensitive     GENTAMICIN <=1 SENSITIVE Sensitive     IMIPENEM 2 SENSITIVE Sensitive     NITROFURANTOIN 128 RESISTANT Resistant  TRIMETH/SULFA <=20 SENSITIVE Sensitive     AMPICILLIN/SULBACTAM <=2 SENSITIVE Sensitive     PIP/TAZO <=4 SENSITIVE Sensitive     * >=100,000 COLONIES/mL PROTEUS MIRABILIS  MRSA Next Gen by PCR, Nasal     Status: None   Collection Time: 03/31/21  5:32 PM   Specimen: Nasal Mucosa; Nasal Swab  Result Value Ref Range Status   MRSA by PCR Next Gen NOT DETECTED NOT DETECTED Final    Comment: (NOTE) The GeneXpert MRSA Assay (FDA approved for NASAL specimens only), is one component of a comprehensive MRSA colonization surveillance program. It is not intended to diagnose MRSA infection nor to guide or monitor treatment for MRSA infections. Test performance is not FDA approved in patients less than 22 years old. Performed at [facility], [place] [address], [place], [place] [address]   Resp Panel by RT-PCR (Flu A&B, Covid) Nasopharyngeal Swab     Status: None   Collection Time: 04/02/21 12:08 PM   Specimen: Nasopharyngeal Swab; Nasopharyngeal(NP) swabs in vial transport medium  Result Value Ref Range Status   SARS Coronavirus 2 by RT PCR NEGATIVE NEGATIVE Final    Comment: (NOTE) SARS-CoV-2 target nucleic acids are NOT DETECTED.  The SARS-CoV-2 RNA is generally detectable in upper respiratory specimens  during the acute phase of infection. The lowest concentration of SARS-CoV-2 viral copies this assay can detect is 138 copies/mL. A negative result does not preclude SARS-Cov-2 infection and should not be used as the sole basis for treatment or other patient management decisions. A negative result may occur with  improper specimen collection/handling, submission of specimen other than nasopharyngeal swab, presence of viral mutation(s) within the areas targeted by this assay, and inadequate number of viral copies(<138 copies/mL). A negative result must be combined with clinical observations, patient history, and epidemiological information. The expected result is Negative.  Fact Sheet for Patients:  EntrepreneurPulse.com.au  Fact Sheet for Healthcare Providers:  IncredibleEmployment.be  This test is no t yet approved or cleared by the [place] FDA and  has been authorized for detection and/or diagnosis of SARS-CoV-2 by FDA under an Emergency Use Authorization (EUA). This EUA will remain  in effect (meaning this test can be used) for the duration of the COVID-19 declaration under Section 564(b)(1) of the Act, 21 U.S.C.section 360bbb-3(b)(1), unless the authorization is terminated  or revoked sooner.       Influenza A by PCR NEGATIVE NEGATIVE Final   Influenza B by PCR NEGATIVE NEGATIVE Final    Comment: (NOTE) The Xpert Xpress SARS-CoV-2/FLU/RSV plus assay is intended as an aid in the diagnosis of influenza from Nasopharyngeal swab specimens and should not be used as a sole basis for treatment. Nasal washings and aspirates are unacceptable for Xpert Xpress SARS-CoV-2/FLU/RSV testing.  Fact Sheet for Patients: EntrepreneurPulse.com.au  Fact Sheet for Healthcare Providers: IncredibleEmployment.be  This test is not yet approved or cleared by the [place] FDA and has been authorized for detection  and/or diagnosis of SARS-CoV-2 by FDA under an Emergency Use Authorization (EUA). This EUA will remain in effect (meaning this test can be used) for the duration of the COVID-19 declaration under Section 564(b)(1) of the Act, 21 U.S.C. section 360bbb-3(b)(1), unless the authorization is terminated or revoked.  Performed at [facility], [place] [address], [place], [place] [address]           Radiology Studies: No results found.      Scheduled Meds:  acetaminophen  500 mg Oral TID  aspirin  81 mg Oral Daily   ferrous [id number] C-folic acid  1 capsule Oral QPC breakfast   hydrALAZINE  50 mg Oral TID   insulin aspart  0-9 Units Subcutaneous TID WC   metoprolol tartrate  12.5 mg Oral BID   oxybutynin  5 mg Oral BID   pantoprazole  40 mg Oral Daily   sodium bicarbonate  650 mg Oral BID   verapamil  120 mg Oral QHS   Continuous Infusions:  sodium chloride 100 mL/hr at 04/06/21 0518      LOS: 6 days    Time spent: 35 minutes    Irine Seal, MD [facility]   To contact the attending provider between 7A-7P or the covering provider during after hours 7P-7A, please log into the web site [email or web address] and access using universal [place] password for that web site. If you do not have the password, please call the hospital operator.  04/06/2021, 12:58 PM

## 2021-04-07 LAB — BASIC METABOLIC PANEL
Anion gap: 6 (ref 5–15)
BUN: 22 mg/dL (ref 8–23)
CO2: 18 mmol/L — ABNORMAL LOW (ref 22–32)
Calcium: 8.3 mg/dL — ABNORMAL LOW (ref 8.9–10.3)
Chloride: 110 mmol/L (ref 98–111)
Creatinine, Ser: 1.07 mg/dL — ABNORMAL HIGH (ref 0.44–1.00)
GFR, Estimated: 50 mL/min — ABNORMAL LOW (ref >60)
Glucose, Bld: 114 mg/dL — ABNORMAL HIGH (ref 70–99)
Potassium: 4.2 mmol/L (ref 3.5–5.1)
Sodium: 134 mmol/L — ABNORMAL LOW (ref 135–145)

## 2021-04-07 LAB — GLUCOSE, CAPILLARY
Glucose-Capillary: 101 mg/dL — ABNORMAL HIGH (ref 70–99)
Glucose-Capillary: 156 mg/dL — ABNORMAL HIGH (ref 70–99)
Glucose-Capillary: 113 mg/dL — ABNORMAL HIGH (ref 70–99)
Glucose-Capillary: 150 mg/dL — ABNORMAL HIGH (ref 70–99)

## 2021-04-07 LAB — RESP PANEL BY RT-PCR (FLU A&B, COVID) ARPGX2
Influenza A by PCR: NEGATIVE
Influenza B by PCR: NEGATIVE
SARS Coronavirus 2 by RT PCR: NEGATIVE

## 2021-04-07 LAB — HEMOGLOBIN AND HEMATOCRIT, BLOOD
HCT: 30.7 % — ABNORMAL LOW (ref 36.0–46.0)
Hemoglobin: 9.9 g/dL — ABNORMAL LOW (ref 12.0–15.0)

## 2021-04-07 NOTE — Progress Notes (Signed)
Physical Therapy Treatment Patient Details Name: Lisa Villegas MRN: [id number] DOB: 09-07-1930 Today's Date: 04/07/2021   History of Present Illness Lisa Villegas is a 88 y.o. female  admitted on  03/30/21  due to dark stools, decreased appetite and inability to walk due to injury on her right foot.  X-rays of the right foot demonstrate 1st digit proximal phalanx fracture, and minimally displaced fractures of the 3rd, 4th, and 5th metatarsal necks - ortho recommended post op shoe WBAT.  Pt with medical history significant of anemia, autism,anxiety, tuberculosis,  chronic diastolic heart failure, stage IIIa chronic kidney disease, type II DM, diabetic neuropathy, thyroid nodule, hypertension, hyperlipidemia, incontinence, osteopenia, history of other non-hemorrhagic stroke    PT Comments    Additional session to assist pt with return to bed. Pt with improve initiation to stand from recliner with RW. Mod+2 to take small steps and move to EOB. Pt returned to supine with poor control to lower trunk and Mod assist to bring LE's onto bed. Goal to progress to gait next session. Will progress as able.     Recommendations for follow up therapy are one component of a multi-disciplinary discharge planning process, led by the attending physician.  Recommendations may be updated based on patient status, additional functional criteria and insurance authorization.  Follow Up Recommendations  Skilled nursing-short term rehab (<3 hours/day)     Assistance Recommended at Discharge Frequent or constant Supervision/Assistance  Patient can return home with the following Two people to help with walking and/or transfers;Two people to help with bathing/dressing/bathroom;Assist for transportation;Help with stairs or ramp for entrance;Direct supervision/assist for medications management;Assistance with feeding   Equipment Recommendations  None recommended by PT    Recommendations for Other Services        Precautions / Restrictions Precautions Precautions: Fall Precaution Comments: Autistic, Difficulty following directions Required Braces or Orthoses: Splint/Cast Splint/Cast: post op shoe on right foot Restrictions Weight Bearing Restrictions: No RLE Weight Bearing: Weight bearing as tolerated     Mobility  Bed Mobility Overal bed mobility: Needs Assistance Bed Mobility: Sit to Supine     Supine to sit: +2 for physical assistance;Max assist;+2 for safety/equipment Sit to supine: Mod assist;+2 for safety/equipment;+2 for physical assistance   General bed mobility comments: Mod + to return to supine safely. and MAx to reposition with bed pad.    Transfers Overall transfer level: Needs assistance Equipment used: Rolling walker (2 wheels) Transfers: Sit to/from Stand;Bed to chair/wheelchair/BSC Sit to Stand: +2 physical assistance;+2 safety/equipment;From elevated surface;Min assist Stand pivot transfers: Max assist;+2 physical assistance;+2 safety/equipment;From elevated surface   Step pivot transfers: Mod assist;+2 safety/equipment;+2 physical assistance;From elevated surface     General transfer comment: Pt required Min +2 assist to rise from recliner to RW and Mod +2 to pivot/step to EOB with RW.    Ambulation/Gait                   Stairs             Wheelchair Mobility    Modified Rankin (Stroke Patients Only)       Balance Overall balance assessment: Needs assistance Sitting-balance support: Bilateral upper extremity supported Sitting balance-Leahy Scale: Poor Sitting balance - Comments: pt completed self care activities seated EOB with Max support posteriorly to prevent posterior LOB, wash mouth, face, brush teeth.  Cognition Arousal/Alertness: Awake/alert Behavior During Therapy: WFL for tasks assessed/performed Overall Cognitive Status: History of cognitive impairments - at baseline                                  General Comments: patient able to follow simple commands intermittently. patient stating "no" to most questions asked. once sitting EOB and cues to stand pt initiate and once in recliner pt calm and appeared comfortable.        Exercises      General Comments        Pertinent Vitals/Pain Pain Assessment: PAINAD Breathing: normal Negative Vocalization: none Facial Expression: smiling or inexpressive Body Language: relaxed Consolability: no need to console PAINAD Score: 0 Pain Descriptors / Indicators: Other (Comment) (pt unable to state) Pain Intervention(s): Monitored during session;Repositioned    Home Living                          Prior Function            PT Goals (current goals can now be found in the care plan section) Acute Rehab PT Goals Patient Stated Goal: none stated PT Goal Formulation: Patient unable to participate in goal setting Time For Goal Achievement: 04/14/21 Potential to Achieve Goals: Fair Progress towards PT goals: Progressing toward goals    Frequency    Min 2X/week      PT Plan Current plan remains appropriate    Co-evaluation PT/OT/SLP Co-Evaluation/Treatment: Yes            AM-PAC PT "6 Clicks" Mobility   Outcome Measure  Help needed turning from your back to your side while in a flat bed without using bedrails?: A Lot Help needed moving from lying on your back to sitting on the side of a flat bed without using bedrails?: Total Help needed moving to and from a bed to a chair (including a wheelchair)?: Total Help needed standing up from a chair using your arms (e.g., wheelchair or bedside chair)?: Total Help needed to walk in hospital room?: Total Help needed climbing 3-5 steps with a railing? : Total 6 Click Score: 7    End of Session Equipment Utilized During Treatment: Gait belt Activity Tolerance: Other (comment);Patient tolerated treatment well (limited due to  cognitive impairments, motivation- pt wanting to return to supine) Patient left: with call bell/phone within reach;in chair;with chair alarm set Nurse Communication: Mobility status PT Visit Diagnosis: History of falling (Z91.81);Other abnormalities of gait and mobility (R26.89)     Time: 0141-0301 PT Time Calculation (min) (ACUTE ONLY): 9 min  Charges:  $Therapeutic Activity: 8-22 mins                     Verner Mould, DPT Acute Rehabilitation Services Office [phone] Pager [phone]    Jacques Navy 04/07/2021, 4:52 PM

## 2021-04-07 NOTE — Assessment & Plan Note (Addendum)
Replace and follow. ?

## 2021-04-07 NOTE — Assessment & Plan Note (Addendum)
Orthopedics recommending post op shoe and WBAT Therapy recommending SNF Plain films 1/3 with mildly comminuted fx of proximal phalanx of first digit, displaced fx of 3rd, 4th, and 5th metatarsal necks which may be chronic process Follow up with orthopedics 1-2 weeks after discharge - Call [phone] to arrange office follow up with Dr. Griffin Basil or Noemi Chapel, PA-C

## 2021-04-07 NOTE — Progress Notes (Signed)
Physical Therapy Treatment Patient Details Name: Lisa Villegas MRN: [id number] DOB: 05/22/1930 Today's Date: 04/07/2021   History of Present Illness Lisa Villegas is a 88 y.o. female  admitted on  03/30/21  due to dark stools, decreased appetite and inability to walk due to injury on her right foot.  X-rays of the right foot demonstrate 1st digit proximal phalanx fracture, and minimally displaced fractures of the 3rd, 4th, and 5th metatarsal necks - ortho recommended post op shoe WBAT.  Pt with medical history significant of anemia, autism,anxiety, tuberculosis,  chronic diastolic heart failure, stage IIIa chronic kidney disease, type II DM, diabetic neuropathy, thyroid nodule, hypertension, hyperlipidemia, incontinence, osteopenia, history of other non-hemorrhagic stroke    PT Comments    Pt progressing gradually with therapy. Max encouragement to transfer supine to sit and +2 assist to achieve upright seated posture. Pt initiated stand with cues but no attempt to take steps towards recliner from pt and Max +2 to pivot with [facility] required. Acute PT will continue to progress pt as able.    Recommendations for follow up therapy are one component of a multi-disciplinary discharge planning process, led by the attending physician.  Recommendations may be updated based on patient status, additional functional criteria and insurance authorization.  Follow Up Recommendations  Skilled nursing-short term rehab (<3 hours/day)     Assistance Recommended at Discharge Frequent or constant Supervision/Assistance  Patient can return home with the following Two people to help with walking and/or transfers;Two people to help with bathing/dressing/bathroom;Assist for transportation;Help with stairs or ramp for entrance;Direct supervision/assist for medications management;Assistance with feeding   Equipment Recommendations  None recommended by PT    Recommendations for Other Services        Precautions / Restrictions Precautions Precautions: Fall Precaution Comments: Autistic? Difficulty following directions Required Braces or Orthoses: Splint/Cast Splint/Cast: post op shoe on right foot Restrictions Weight Bearing Restrictions: No RLE Weight Bearing: Weight bearing as tolerated     Mobility  Bed Mobility Overal bed mobility: Needs Assistance Bed Mobility: Supine to Sit     Supine to sit: +2 for physical assistance;Max assist;+2 for safety/equipment     General bed mobility comments: MAX encouragemetn ot initaite movign to EOB, pt began reaching, and movign LE's towards edge but did not complete. pt also initaited raising trunk off bed in crunch motion but could not achieve full upright. Max +2 with bed pad to pivot fully. Assist to prevent posterior lean in sitting.    Transfers Overall transfer level: Needs assistance   Transfers: Sit to/from Stand;Bed to chair/wheelchair/BSC Sit to Stand: Mod assist;+2 physical assistance;+2 safety/equipment;From elevated surface Stand pivot transfers: Max assist;+2 physical assistance;+2 safety/equipment;From elevated surface         General transfer comment: 2HHA assist from PT/OT and Mod assist to rise with pt assisting to initiate power with cue to stand. Pt not takign any steps or moving feet to recliner and Max +2 to pivot hips and sit in recliner.    Ambulation/Gait                   Stairs             Wheelchair Mobility    Modified Rankin (Stroke Patients Only)       Balance Overall balance assessment: Needs assistance Sitting-balance support: Bilateral upper extremity supported Sitting balance-Leahy Scale: Poor Sitting balance - Comments: pt completed self care activities seated EOB with Max support posteriorly to prevent posterior LOB, wash mouth, face,  brush teeth.                                    Cognition Arousal/Alertness: Awake/alert Behavior During Therapy: WFL  for tasks assessed/performed Overall Cognitive Status: History of cognitive impairments - at baseline                                 General Comments: patient able to follow simple commands intermittently. patient stating "no" to most questions asked. once sitting EOB and cues to stand pt initiate and once in recliner pt calm and appeared comfortable.        Exercises      General Comments        Pertinent Vitals/Pain Pain Assessment: PAINAD Breathing: normal Negative Vocalization: none Facial Expression: smiling or inexpressive Body Language: relaxed Consolability: no need to console PAINAD Score: 0 Pain Descriptors / Indicators: Other (Comment) (pt unable to state) Pain Intervention(s): Monitored during session;Repositioned    Home Living                          Prior Function            PT Goals (current goals can now be found in the care plan section) Acute Rehab PT Goals Patient Stated Goal: none stated PT Goal Formulation: Patient unable to participate in goal setting Time For Goal Achievement: 04/14/21 Potential to Achieve Goals: Fair Progress towards PT goals: Progressing toward goals    Frequency    Min 2X/week      PT Plan Current plan remains appropriate    Co-evaluation PT/OT/SLP Co-Evaluation/Treatment: Yes            AM-PAC PT "6 Clicks" Mobility   Outcome Measure  Help needed turning from your back to your side while in a flat bed without using bedrails?: A Lot Help needed moving from lying on your back to sitting on the side of a flat bed without using bedrails?: Total Help needed moving to and from a bed to a chair (including a wheelchair)?: Total Help needed standing up from a chair using your arms (e.g., wheelchair or bedside chair)?: Total Help needed to walk in hospital room?: Total Help needed climbing 3-5 steps with a railing? : Total 6 Click Score: 7    End of Session Equipment Utilized During  Treatment: Gait belt Activity Tolerance: Other (comment);Patient tolerated treatment well (limited due to cognitive impairments, motivation- pt wanting to return to supine) Patient left: with call bell/phone within reach;in chair;with chair alarm set Nurse Communication: Mobility status PT Visit Diagnosis: History of falling (Z91.81);Other abnormalities of gait and mobility (R26.89)     Time: 2947-6546 PT Time Calculation (min) (ACUTE ONLY): 16 min  Charges:                        Verner Mould, DPT Acute Rehabilitation Services Office [phone] Pager [phone]    Jacques Navy 04/07/2021, 4:39 PM

## 2021-04-07 NOTE — Assessment & Plan Note (Signed)
S/p 7 days for proteus and e. Coli UTI's

## 2021-04-07 NOTE — Assessment & Plan Note (Addendum)
a1c 6.7 SSI Resume home regimen at discharge (lantus and metformin at home)

## 2021-04-07 NOTE — Progress Notes (Signed)
PROGRESS NOTE    Lisa Villegas  LNL:[id number] DOB: [birth year] DOA: 03/30/2021 PCP: [name], MD  Chief Complaint  Patient presents with   Melena   decreased appetite    Brief Narrative:  Patient is Lisa Villegas 88 year old female history of anemia, anxiety, chronic diastolic CHF, stage IIIa CKD, type 2 diabetes, diabetic neuropathy, thyroid nodule, osteopenia, history of nonhemorrhagic CVA sent from facility secondary to dark stools, decreased appetite, inability to walk due to injury to the right foot.  Patient noted to be taking 2 different iron supplementation pills.  Hemoglobin normal.  Work-up in the ED with urinalysis concerning for UTI.  Troponin is flattened but elevated at 49 and 47.  Chest x-ray unremarkable.  Plain films of the right foot with multiple metatarsal fractures.  Orthopedics consulted.  Patient placed on empiric IV antibiotics.    Assessment & Plan:   Principal Problem:   AKI (acute kidney injury) ([place]) Active Problems:   Hypomagnesemia   Multiple closed fractures of metatarsal bone of right foot   Hypertension   HTN (hypertension), benign   Type II diabetes mellitus with renal manifestations (HCC)   UTI (urinary tract infection)   Anemia   Autism   CKD (chronic kidney disease), stage III (HCC)   Fracture of unsp metatarsal bone(s), right foot, init   Malnutrition of moderate degree   * AKI (acute kidney injury) ([place])- (present on admission) Improved after hydration follow    Hypomagnesemia- (present on admission) trend  Multiple closed fractures of metatarsal bone of right foot Orthopedics recommending post op shoe and WBAT Therapy recommending SNF Plain films 1/3 with mildly comminuted fx of proximal phalanx of first digit, displaced fx of 3rd, 4th, and 5th metatarsal necks which may be chronic process  Hypertension- (present on admission) Metop, verapamil, hydral  Type II diabetes mellitus with renal manifestations ([place])- (present on  admission) a1c 6.7 SSI  UTI (urinary tract infection) S/p 7 days for proteus and e. Coli UTI's  Anemia- (present on admission) stable   DVT prophylaxis: SCD Code Status: DNR Family Communication: none at bedside Disposition:   Status is: Inpatient  Remains inpatient appropriate because: awaiting SNF       Consultants:  ortho  Procedures:  none  Antimicrobials:  Anti-infectives (From admission, onward)    Start     Dose/Rate Route Frequency Ordered Stop   04/02/21 1215  cefdinir (OMNICEF) capsule 300 mg  Status:  Discontinued        300 mg Oral Daily 04/02/21 1126 04/05/21 1512   04/02/21 0000  cefdinir (OMNICEF) 300 MG capsule  Status:  Discontinued        300 mg Oral Daily 04/02/21 1337 04/05/21    04/01/21 1800  cefTRIAXone (ROCEPHIN) 2 g in sodium chloride 0.9 % 100 mL IVPB  Status:  Discontinued        2 g 200 mL/hr over 30 Minutes Intravenous Every 24 hours 04/01/21 0907 04/02/21 1126   03/31/21 1445  cefTRIAXone (ROCEPHIN) 1 g in sodium chloride 0.9 % 100 mL IVPB  Status:  Discontinued        1 g 200 mL/hr over 30 Minutes Intravenous Daily 03/31/21 1432 04/01/21 0907   03/31/21 1445  cefTRIAXone (ROCEPHIN) 1 g in sodium chloride 0.9 % 100 mL IVPB  Status:  Discontinued        1 g 200 mL/hr over 30 Minutes Intravenous Daily 03/31/21 1433 03/31/21 1444   03/30/21 1815  ciprofloxacin (CIPRO) IVPB 400 mg  Status:  Discontinued        400 mg 200 mL/hr over 60 Minutes Intravenous Every 12 hours 03/30/21 1803 03/31/21 1419       Subjective: No new complaints  Objective: Vitals:   04/06/21 1017 04/06/21 2037 04/07/21 0439 04/07/21 1432  BP: (!) 162/71 (!) 196/70 (!) 163/76 140/68  Pulse: 72 80 74 66  Resp:  20 20 20   Temp:  98.1 F (36.7 C) 97.8 F (36.6 C) 98.8 F (37.1 C)  TempSrc:  Oral Axillary Oral  SpO2:  97% 95% 97%  Weight:      Height:        Intake/Output Summary (Last 24 hours) at 04/07/2021 1929 Last data filed at 04/07/2021  1600 Gross per 24 hour  Intake --  Output 1900 ml  Net -1900 ml   Filed Weights   04/01/21 1056  Weight: 58.4 kg    Examination:  General exam: Appears calm and comfortable  Respiratory system: unlabored Cardiovascular system: RRR Gastrointestinal system: Abdomen is nondistended, soft and nontender Central nervous system: pleasantly confused Extremities: no LEE    Data Reviewed: I have personally reviewed following labs and imaging studies  CBC: Recent Labs  Lab 04/01/21 0553 04/02/21 0520 04/03/21 0548 04/04/21 0551 04/05/21 0501 04/07/21 0444  WBC 9.0 10.1 10.5 7.1 8.7  --   NEUTROABS 6.3  --   --   --   --   --   HGB 10.7* 11.1* 12.1 10.9* 10.8* 9.9*  HCT 33.1* 35.7* 37.8 35.4* 33.4* 30.7*  MCV 84.7 87.5 84.4 87.2 85.9  --   PLT 244 272 310 285 298  --     Basic Metabolic Panel: Recent Labs  Lab 04/01/21 0553 04/02/21 0520 04/03/21 0548 04/04/21 0551 04/05/21 0501 04/06/21 0933 04/07/21 0444  NA 131* 134* 133* 137 134* 134* 134*  K 3.8 3.7 3.5 3.8 4.1 4.8 4.2  CL 102 107 105 107 105 108 110  CO2 20* 17* 21* 21* 22 18* 18*  GLUCOSE 102* 86 113* 74 137* 184* 114*  BUN 29* 23 18 23  32* 25* 22  CREATININE 1.19* 1.20* 0.89 1.32* 1.75* 1.34* 1.07*  CALCIUM 8.3* 8.3* 8.8* 8.8* 8.7* 8.4* 8.3*  MG 2.2 1.7  --  1.6* 2.2  --   --     GFR: Estimated Creatinine Clearance: 32.7 mL/min (Lisa Villegas) (by C-G formula based on SCr of 1.07 mg/dL (H)).  Liver Function Tests: No results for input(s): AST, ALT, ALKPHOS, BILITOT, PROT, ALBUMIN in the last 168 hours.  CBG: Recent Labs  Lab 04/06/21 1645 04/06/21 2040 04/07/21 0748 04/07/21 1325 04/07/21 1651  GLUCAP 107* 146* 101* 156* 113*     Recent Results (from the past 240 hour(s))  Resp Panel by RT-PCR (Lisa Villegas, Covid) Nasopharyngeal Swab     Status: None   Collection Time: 03/30/21 11:00 AM   Specimen: Nasopharyngeal Swab; Nasopharyngeal(NP) swabs in vial transport medium  Result Value Ref Range Status    SARS Coronavirus 2 by RT PCR NEGATIVE NEGATIVE Final    Comment: (NOTE) SARS-CoV-2 target nucleic acids are NOT DETECTED.  The SARS-CoV-2 RNA is generally detectable in upper respiratory specimens during the acute phase of infection. The lowest concentration of SARS-CoV-2 viral copies this assay can detect is 138 copies/mL. Bohden Dung negative result does not preclude SARS-Cov-2 infection and should not be used as the sole basis for treatment or other patient management decisions. Onesha Krebbs negative result may occur with  improper specimen collection/handling, submission of specimen other than nasopharyngeal  swab, presence of viral mutation(s) within the areas targeted by this assay, and inadequate number of viral copies(<138 copies/mL). Hezzie Karim negative result must be combined with clinical observations, patient history, and epidemiological information. The expected result is Negative.  Fact Sheet for Patients:  EntrepreneurPulse.com.au  Fact Sheet for Healthcare Providers:  IncredibleEmployment.be  This test is no t yet approved or cleared by the [place] FDA and  has been authorized for detection and/or diagnosis of SARS-CoV-2 by FDA under an Emergency Use Authorization (EUA). This EUA will remain  in effect (meaning this test can be used) for the duration of the COVID-19 declaration under Section 564(b)(1) of the Act, 21 U.S.C.section 360bbb-3(b)(1), unless the authorization is terminated  or revoked sooner.       Influenza Marvyn Torrez by PCR NEGATIVE NEGATIVE Final   Influenza B by PCR NEGATIVE NEGATIVE Final    Comment: (NOTE) The Xpert Xpress SARS-CoV-2/Lisa/RSV plus assay is intended as an aid in the diagnosis of influenza from Nasopharyngeal swab specimens and should not be used as Emori Kamau sole basis for treatment. Nasal washings and aspirates are unacceptable for Xpert Xpress SARS-CoV-2/Lisa/RSV testing.  Fact Sheet for  Patients: EntrepreneurPulse.com.au  Fact Sheet for Healthcare Providers: IncredibleEmployment.be  This test is not yet approved or cleared by the [place] FDA and has been authorized for detection and/or diagnosis of SARS-CoV-2 by FDA under an Emergency Use Authorization (EUA). This EUA will remain in effect (meaning this test can be used) for the duration of the COVID-19 declaration under Section 564(b)(1) of the Act, 21 U.S.C. section 360bbb-3(b)(1), unless the authorization is terminated or revoked.  Performed at [facility], [place] [address], [place], [place] [address]   Urine Culture     Status: Abnormal   Collection Time: 03/30/21  4:04 PM   Specimen: Urine, Clean Catch  Result Value Ref Range Status   Specimen Description   Final    URINE, CLEAN CATCH Performed at [facility], [place] [address], [place], [place] [address]    Special Requests   Final    NONE Performed at [facility], [place] [address], [place], [place] [address]    Culture (Lillyanne Bradburn)  Final    >=100,000 COLONIES/mL PROTEUS MIRABILIS 50,000 COLONIES/mL ESCHERICHIA COLI    Report Status 04/04/2021 FINAL  Final   Organism ID, Bacteria PROTEUS MIRABILIS (Rashelle Ireland)  Final   Organism ID, Bacteria ESCHERICHIA COLI (March Joos)  Final      Susceptibility   Escherichia coli - MIC*    AMPICILLIN <=2 SENSITIVE Sensitive     CEFAZOLIN <=4 SENSITIVE Sensitive     CEFEPIME <=0.12 SENSITIVE Sensitive     CEFTRIAXONE <=0.25 SENSITIVE Sensitive     CIPROFLOXACIN <=0.25 SENSITIVE Sensitive     GENTAMICIN <=1 SENSITIVE Sensitive     IMIPENEM <=0.25 SENSITIVE Sensitive     NITROFURANTOIN <=16 SENSITIVE Sensitive     TRIMETH/SULFA <=20 SENSITIVE Sensitive     AMPICILLIN/SULBACTAM <=2 SENSITIVE Sensitive     PIP/TAZO <=4 SENSITIVE Sensitive     * 50,000 COLONIES/mL ESCHERICHIA COLI   Proteus mirabilis - MIC*    AMPICILLIN <=2 SENSITIVE Sensitive      CEFAZOLIN <=4 SENSITIVE Sensitive     CEFEPIME <=0.12 SENSITIVE Sensitive     CEFTRIAXONE <=0.25 SENSITIVE Sensitive     CIPROFLOXACIN <=0.25 SENSITIVE Sensitive     GENTAMICIN <=1 SENSITIVE Sensitive     IMIPENEM 2 SENSITIVE Sensitive     NITROFURANTOIN 128 RESISTANT Resistant  TRIMETH/SULFA <=20 SENSITIVE Sensitive     AMPICILLIN/SULBACTAM <=2 SENSITIVE Sensitive     PIP/TAZO <=4 SENSITIVE Sensitive     * >=100,000 COLONIES/mL PROTEUS MIRABILIS  MRSA Next Gen by PCR, Nasal     Status: None   Collection Time: 03/31/21  5:32 PM   Specimen: Nasal Mucosa; Nasal Swab  Result Value Ref Range Status   MRSA by PCR Next Gen NOT DETECTED NOT DETECTED Final    Comment: (NOTE) The GeneXpert MRSA Assay (FDA approved for NASAL specimens only), is one component of Jerol Rufener comprehensive MRSA colonization surveillance program. It is not intended to diagnose MRSA infection nor to guide or monitor treatment for MRSA infections. Test performance is not FDA approved in patients less than 73 years old. Performed at [facility], [place] [address] [place], [place] [address]   Resp Panel by RT-PCR (Lisa Rohn Fritsch&B, Covid) Nasopharyngeal Swab     Status: None   Collection Time: 04/02/21 12:08 PM   Specimen: Nasopharyngeal Swab; Nasopharyngeal(NP) swabs in vial transport medium  Result Value Ref Range Status   SARS Coronavirus 2 by RT PCR NEGATIVE NEGATIVE Final    Comment: (NOTE) SARS-CoV-2 target nucleic acids are NOT DETECTED.  The SARS-CoV-2 RNA is generally detectable in upper respiratory specimens during the acute phase of infection. The lowest concentration of SARS-CoV-2 viral copies this assay can detect is 138 copies/mL. Avarose Mervine negative result does not preclude SARS-Cov-2 infection and should not be used as the sole basis for treatment or other patient management decisions. Vilma Will negative result may occur with  improper specimen collection/handling, submission of specimen other than  nasopharyngeal swab, presence of viral mutation(s) within the areas targeted by this assay, and inadequate number of viral copies(<138 copies/mL). Lum Stillinger negative result must be combined with clinical observations, patient history, and epidemiological information. The expected result is Negative.  Fact Sheet for Patients:  EntrepreneurPulse.com.au  Fact Sheet for Healthcare Providers:  IncredibleEmployment.be  This test is no t yet approved or cleared by the [place] FDA and  has been authorized for detection and/or diagnosis of SARS-CoV-2 by FDA under an Emergency Use Authorization (EUA). This EUA will remain  in effect (meaning this test can be used) for the duration of the COVID-19 declaration under Section 564(b)(1) of the Act, 21 U.S.C.section 360bbb-3(b)(1), unless the authorization is terminated  or revoked sooner.       Influenza Elston Aldape by PCR NEGATIVE NEGATIVE Final   Influenza B by PCR NEGATIVE NEGATIVE Final    Comment: (NOTE) The Xpert Xpress SARS-CoV-2/Lisa/RSV plus assay is intended as an aid in the diagnosis of influenza from Nasopharyngeal swab specimens and should not be used as Bertrand Vowels sole basis for treatment. Nasal washings and aspirates are unacceptable for Xpert Xpress SARS-CoV-2/Lisa/RSV testing.  Fact Sheet for Patients: EntrepreneurPulse.com.au  Fact Sheet for Healthcare Providers: IncredibleEmployment.be  This test is not yet approved or cleared by the [place] FDA and has been authorized for detection and/or diagnosis of SARS-CoV-2 by FDA under an Emergency Use Authorization (EUA). This EUA will remain in effect (meaning this test can be used) for the duration of the COVID-19 declaration under Section 564(b)(1) of the Act, 21 U.S.C. section 360bbb-3(b)(1), unless the authorization is terminated or revoked.  Performed at [facility], [place] [address] [place], [place] [address]          Radiology Studies: No results found.      Scheduled Meds:  acetaminophen  500 mg Oral TID   aspirin  81 mg Oral Daily   ferrous [id number] C-folic acid  1 capsule Oral QPC breakfast   hydrALAZINE  50 mg Oral TID   insulin aspart  0-9 Units Subcutaneous TID WC   metoprolol tartrate  12.5 mg Oral BID   oxybutynin  5 mg Oral BID   pantoprazole  40 mg Oral Daily   sodium bicarbonate  650 mg Oral BID   verapamil  120 mg Oral QHS   Continuous Infusions:  sodium chloride 100 mL/hr at 04/06/21 1822     LOS: 7 days    Time spent: over 30 min    Fayrene Helper, MD [facility]   To contact the attending provider between 7A-7P or the covering provider during after hours 7P-7A, please log into the web site [email or web address] and access using universal [place] password for that web site. If you do not have the password, please call the hospital operator.  04/07/2021, 7:29 PM

## 2021-04-07 NOTE — Hospital Course (Addendum)
Patient is Lisa Villegas  88 year old female history of anemia, anxiety, chronic diastolic CHF, stage IIIa CKD, type 2 diabetes, diabetic neuropathy, thyroid nodule, osteopenia, history of nonhemorrhagic CVA sent from facility secondary to dark stools, decreased appetite, inability to walk due to injury to the right foot.  Patient noted to be taking 2 different iron supplementation pills.  Hemoglobin normal.  Work-up in the ED with urinalysis concerning for UTI.  Troponin is flattened but elevated at 49 and 47.  Chest x-ray unremarkable.  Plain films of the right foot with multiple metatarsal fractures.  Orthopedics consulted and recommended WBAT with post op shoe and outpatient follow up.  She was treated with antibiotics for [name] UTI.  She's now stable for discharge to SNF.    See below for additional details.

## 2021-04-07 NOTE — Progress Notes (Signed)
Occupational Therapy Treatment Patient Details Name: Lisa Villegas MRN: [id number] DOB: Jul 04, 1930 Today's Date: 04/07/2021   History of present illness Lisa Villegas is a 88 y.o. female  admitted on  03/30/21  due to dark stools, decreased appetite and inability to walk due to injury on her right foot.  X-rays of the right foot demonstrate 1st digit proximal phalanx fracture, and minimally displaced fractures of the 3rd, 4th, and 5th metatarsal necks - ortho recommended post op shoe WBAT.  Pt with medical history significant of anemia, autism,anxiety, tuberculosis,  chronic diastolic heart failure, stage IIIa chronic kidney disease, type II DM, diabetic neuropathy, thyroid nodule, hypertension, hyperlipidemia, incontinence, osteopenia, history of other non-hemorrhagic stroke   OT comments  Patient max x 2 to transfer to side of bed and required physical assistance to stay upright at edge of bed - patient would prefer to be in bed at all times. With encouragement and multimodal cues patient washed face and brushed teeth - though quality poor. Patient mod x 2 to pivot to recliner. Patient positioned in seated position with feet down - and appeared comfortable and agreeable after transfer. Patient's tray placed in front of her. Patient is limited by autistic diagnosis - and is resistant to new people and tasks but tolerated new position in chair well after completion. Patient requires 24/7 assistance at discharge.    Recommendations for follow up therapy are one component of a multi-disciplinary discharge planning process, led by the attending physician.  Recommendations may be updated based on patient status, additional functional criteria and insurance authorization.    Follow Up Recommendations  Skilled nursing-short term rehab (<3 hours/day)    Assistance Recommended at Discharge Frequent or constant Supervision/Assistance  Patient can return home with the following  A lot of help with  walking and/or transfers;A lot of help with bathing/dressing/bathroom   Equipment Recommendations  None recommended by OT    Recommendations for Other Services      Precautions / Restrictions Precautions Precautions: Fall Precaution Comments: Autistic, Difficulty following directions Required Braces or Orthoses: Splint/Cast Splint/Cast: post op shoe on right foot Restrictions Weight Bearing Restrictions: No RLE Weight Bearing: Weight bearing as tolerated       Mobility Bed Mobility Overal bed mobility: Needs Assistance Bed Mobility: Supine to Sit     Supine to sit: +2 for physical assistance;+2 for safety/equipment;Max assist Sit to supine: Mod assist;+2 for safety/equipment;+2 for physical assistance   General bed mobility comments: Max x 2 to transfer to edge of bed and acclimate patient to sitting edge of bed and reducing posterior lean.    Transfers Overall transfer level: Needs assistance Equipment used: Rolling walker (2 wheels) Transfers: Bed to chair/wheelchair/BSC Sit to Stand: +2 physical assistance;+2 safety/equipment;From elevated surface;Min assist Stand pivot transfers: Max assist;+2 physical assistance;+2 safety/equipment;From elevated surface Step pivot transfers: Mod assist;+2 safety/equipment;+2 physical assistance;From elevated surface       General transfer comment: Mod x  to squat pivot to recliner. Patient able to bear weight through lower extremities.     Balance Overall balance assessment: Needs assistance Sitting-balance support: Bilateral upper extremity supported Sitting balance-Leahy Scale: Poor Sitting balance - Comments: pt completed self care activities seated EOB with Max support posteriorly to prevent posterior LOB, wash mouth, face, brush teeth.                                   ADL either performed or  assessed with clinical judgement   ADL Overall ADL's : Needs assistance/impaired Eating/Feeding: Set  up;Sitting Eating/Feeding Details (indicate cue type and reason): patient feeding self when therapist entered the room. Continued once in chair. Grooming: Set up;Oral care;Wash/dry face;Cueing for sequencing Grooming Details (indicate cue type and reason): Able to wash her face and brush her teeth - with verbal cues for sequencing/quality and another therapist supporting trunk weight so she wouldn't return to supine.                                    Extremity/Trunk Assessment              Vision       Perception     Praxis      Cognition Arousal/Alertness: Awake/alert Behavior During Therapy: WFL for tasks assessed/performed Overall Cognitive Status: History of cognitive impairments - at baseline                                 General Comments: patient able to follow simple commands intermittently. patient stating "no" to most questions asked. once sitting EOB and cues to stand pt initiate and once in recliner pt calm and appeared comfortable.          Exercises     Shoulder Instructions       General Comments      Pertinent Vitals/ Pain       Pain Assessment: Faces Faces Pain Scale: No hurt Breathing: normal Negative Vocalization: none Facial Expression: smiling or inexpressive Body Language: relaxed Consolability: no need to console PAINAD Score: 0 Pain Descriptors / Indicators: Other (Comment) (pt unable to state) Pain Intervention(s): Monitored during session;Repositioned  Home Living                                          Prior Functioning/Environment              Frequency  Min 2X/week        Progress Toward Goals  OT Goals(current goals can now be found in the care plan section)  Progress towards OT goals: Progressing toward goals  Acute Rehab OT Goals OT Goal Formulation: Patient unable to participate in goal setting Time For Goal Achievement: 04/14/21 Potential to Achieve Goals:  [place] Discharge plan remains appropriate    Co-evaluation    PT/OT/SLP Co-Evaluation/Treatment: Yes Reason for Co-Treatment: For patient/therapist safety;Necessary to address cognition/behavior during functional activity;To address functional/ADL transfers PT goals addressed during session: Mobility/safety with mobility;Balance;Proper use of DME OT goals addressed during session: ADL's and self-care (functional mobility)      AM-PAC OT "6 Clicks" Daily Activity     Outcome Measure   Help from another person eating meals?: A Little Help from another person taking care of personal grooming?: A Little Help from another person toileting, which includes using toliet, bedpan, or urinal?: Total Help from another person bathing (including washing, rinsing, drying)?: A Lot Help from another person to put on and taking off regular upper body clothing?: A Lot Help from another person to put on and taking off regular lower body clothing?: Total 6 Click Score: 12    End of Session Equipment Utilized During Treatment: Gait belt  OT Visit Diagnosis: Other symptoms and  signs involving cognitive function;History of falling (Z91.81)   Activity Tolerance Patient tolerated treatment well   Patient Left in chair;with call bell/phone within reach;with chair alarm set   Nurse Communication Mobility status        Time: 1349-1406 OT Time Calculation (min): 17 min  Charges: OT General Charges $OT Visit: 1 Visit OT Treatments $Self Care/Home Management : 8-22 mins  04/07/2021  Derl Barrow, OTR/L [place]  Office [phone] Pager: [place] 04/07/2021, 5:14 PM

## 2021-04-07 NOTE — Assessment & Plan Note (Addendum)
Metop, verapamil, hydral - continue to adjust meds as needed

## 2021-04-07 NOTE — TOC Progression Note (Signed)
Transition of Care [facility]) - Progression Note    Patient Details  Name: Lisa Villegas MRN: [id number] Date of Birth: April 22, 1930  Transition of Care [facility]) CM/SW [place], [place] Phone Number: 04/07/2021, 3:37 PM  Clinical Narrative:   Based on request from OT, made one more last ditch request to see if [facility] would take patient back based on one assist to and from wheelchair.  Darrick Meigs said no.  Spoke with Development worker, international aid at [facility].  She has [name] set up to come sign paperwork tomorrow at [facility], and confirms they have a bed ready for patient after that. TOC will continue to follow during the course of hospitalization.     Expected Discharge Plan: Skilled Nursing Facility Barriers to Discharge: Barriers Resolved  Expected Discharge Plan and Services Expected Discharge Plan: [place] In-house Referral: Clinical Social Work     Living arrangements for the past 2 months: [place] Expected Discharge Date: 04/02/21                                     Social Determinants of Health (SDOH) Interventions    Readmission Risk Interventions Readmission Risk Prevention Plan 08/01/2019 04/02/2019  Transportation Screening Complete Complete  PCP or Specialist Appt within 3-5 Days Complete Complete  HRI or [place] Complete Complete  Social Work Consult for [place] Planning/Counseling Complete Complete  Palliative Care Screening Complete Not Applicable  Medication Review Press photographer) Complete Referral to Pharmacy  Some recent data might be hidden

## 2021-04-07 NOTE — Progress Notes (Signed)
Nutrition Follow-up  DOCUMENTATION CODES:   Non-severe (moderate) malnutrition in context of acute illness/injury  INTERVENTION:   -Continue to monitor intakes and encourage PO  NUTRITION DIAGNOSIS:   Moderate Malnutrition related to acute illness as evidenced by mild fat depletion, moderate muscle depletion.  Ongoing  GOAL:   Patient will meet greater than or equal to 90% of their needs  Progressing.  MONITOR:   PO intake, Weight trends, Labs, I & O's  ASSESSMENT:   88 year old female history of anemia, anxiety, chronic diastolic CHF, stage IIIa CKD, type 2 diabetes, diabetic neuropathy, thyroid nodule, osteopenia, history of nonhemorrhagic CVA sent from facility secondary to dark stools, decreased appetite, inability to walk due to injury to the right foot  Patient currently consuming 100% of meals. No supplemental needs identified.  Will continue to monitor intakes and need for supplements while pt is still admitted.  Admission weight: 128 lbs.   Medications: Foltrin (iron, Vit C, B-12)  Labs reviewed:  CBGs: 101-146 Low Na  Diet Order:   Diet Order             Diet Carb Modified           Diet Carb Modified Fluid consistency: Thin; Room service appropriate? Yes  Diet effective now                   EDUCATION NEEDS:   No education needs have been identified at this time  Skin:  Skin Assessment: Reviewed RN Assessment  Last BM:  1/8  Height:   Ht Readings from Last 1 Encounters:  04/01/21 5\' 5"  (1.651 m)    Weight:   Wt Readings from Last 1 Encounters:  04/01/21 58.4 kg    BMI:  Body mass index is 21.42 kg/m.  Estimated Nutritional Needs:   Kcal:  1500-1700  Protein:  70-80g  Fluid:  1.6L/day  Lisa Bibles, MS, RD, LDN Inpatient Clinical Dietitian Contact information available via Amion

## 2021-04-07 NOTE — Assessment & Plan Note (Addendum)
Improved, fluctuating Started on bicarb for NAGMA

## 2021-04-07 NOTE — Assessment & Plan Note (Addendum)
Stable She's on iron supplementation

## 2021-04-08 DIAGNOSIS — M6281 Muscle weakness (generalized): Secondary | ICD-10-CM | POA: Diagnosis not present

## 2021-04-08 DIAGNOSIS — F809 Developmental disorder of speech and language, unspecified: Secondary | ICD-10-CM | POA: Diagnosis not present

## 2021-04-08 DIAGNOSIS — E785 Hyperlipidemia, unspecified: Secondary | ICD-10-CM | POA: Diagnosis not present

## 2021-04-08 DIAGNOSIS — E119 Type 2 diabetes mellitus without complications: Secondary | ICD-10-CM | POA: Diagnosis not present

## 2021-04-08 DIAGNOSIS — E7800 Pure hypercholesterolemia, unspecified: Secondary | ICD-10-CM | POA: Diagnosis not present

## 2021-04-08 DIAGNOSIS — Z743 Need for continuous supervision: Secondary | ICD-10-CM | POA: Diagnosis not present

## 2021-04-08 DIAGNOSIS — Z515 Encounter for palliative care: Secondary | ICD-10-CM | POA: Diagnosis not present

## 2021-04-08 DIAGNOSIS — R4189 Other symptoms and signs involving cognitive functions and awareness: Secondary | ICD-10-CM | POA: Diagnosis not present

## 2021-04-08 DIAGNOSIS — E049 Nontoxic goiter, unspecified: Secondary | ICD-10-CM | POA: Diagnosis not present

## 2021-04-08 DIAGNOSIS — R509 Fever, unspecified: Secondary | ICD-10-CM | POA: Diagnosis not present

## 2021-04-08 DIAGNOSIS — I63413 Cerebral infarction due to embolism of bilateral middle cerebral arteries: Secondary | ICD-10-CM | POA: Diagnosis not present

## 2021-04-08 DIAGNOSIS — S92901A Unspecified fracture of right foot, initial encounter for closed fracture: Secondary | ICD-10-CM | POA: Diagnosis not present

## 2021-04-08 DIAGNOSIS — I5032 Chronic diastolic (congestive) heart failure: Secondary | ICD-10-CM | POA: Diagnosis not present

## 2021-04-08 DIAGNOSIS — N189 Chronic kidney disease, unspecified: Secondary | ICD-10-CM | POA: Diagnosis not present

## 2021-04-08 DIAGNOSIS — R55 Syncope and collapse: Secondary | ICD-10-CM | POA: Diagnosis not present

## 2021-04-08 DIAGNOSIS — Z7189 Other specified counseling: Secondary | ICD-10-CM | POA: Diagnosis not present

## 2021-04-08 DIAGNOSIS — I471 Supraventricular tachycardia: Secondary | ICD-10-CM | POA: Diagnosis not present

## 2021-04-08 DIAGNOSIS — I6389 Other cerebral infarction: Secondary | ICD-10-CM | POA: Diagnosis not present

## 2021-04-08 DIAGNOSIS — S92354D Nondisplaced fracture of fifth metatarsal bone, right foot, subsequent encounter for fracture with routine healing: Secondary | ICD-10-CM | POA: Diagnosis not present

## 2021-04-08 DIAGNOSIS — R4020 Unspecified coma: Secondary | ICD-10-CM | POA: Diagnosis not present

## 2021-04-08 DIAGNOSIS — E872 Acidosis, unspecified: Secondary | ICD-10-CM | POA: Diagnosis not present

## 2021-04-08 DIAGNOSIS — E1121 Type 2 diabetes mellitus with diabetic nephropathy: Secondary | ICD-10-CM | POA: Diagnosis not present

## 2021-04-08 DIAGNOSIS — E1122 Type 2 diabetes mellitus with diabetic chronic kidney disease: Secondary | ICD-10-CM | POA: Diagnosis not present

## 2021-04-08 DIAGNOSIS — I82411 Acute embolism and thrombosis of right femoral vein: Secondary | ICD-10-CM | POA: Diagnosis not present

## 2021-04-08 DIAGNOSIS — Z66 Do not resuscitate: Secondary | ICD-10-CM | POA: Diagnosis not present

## 2021-04-08 DIAGNOSIS — S92334D Nondisplaced fracture of third metatarsal bone, right foot, subsequent encounter for fracture with routine healing: Secondary | ICD-10-CM | POA: Diagnosis not present

## 2021-04-08 DIAGNOSIS — N390 Urinary tract infection, site not specified: Secondary | ICD-10-CM | POA: Diagnosis not present

## 2021-04-08 DIAGNOSIS — Z20822 Contact with and (suspected) exposure to covid-19: Secondary | ICD-10-CM | POA: Diagnosis not present

## 2021-04-08 DIAGNOSIS — Z741 Need for assistance with personal care: Secondary | ICD-10-CM | POA: Diagnosis not present

## 2021-04-08 DIAGNOSIS — I517 Cardiomegaly: Secondary | ICD-10-CM | POA: Diagnosis not present

## 2021-04-08 DIAGNOSIS — I82403 Acute embolism and thrombosis of unspecified deep veins of lower extremity, bilateral: Secondary | ICD-10-CM | POA: Diagnosis not present

## 2021-04-08 DIAGNOSIS — R251 Tremor, unspecified: Secondary | ICD-10-CM | POA: Diagnosis not present

## 2021-04-08 DIAGNOSIS — F840 Autistic disorder: Secondary | ICD-10-CM | POA: Diagnosis not present

## 2021-04-08 DIAGNOSIS — R2681 Unsteadiness on feet: Secondary | ICD-10-CM | POA: Diagnosis not present

## 2021-04-08 DIAGNOSIS — K219 Gastro-esophageal reflux disease without esophagitis: Secondary | ICD-10-CM | POA: Diagnosis not present

## 2021-04-08 DIAGNOSIS — G936 Cerebral edema: Secondary | ICD-10-CM | POA: Diagnosis not present

## 2021-04-08 DIAGNOSIS — G319 Degenerative disease of nervous system, unspecified: Secondary | ICD-10-CM | POA: Diagnosis not present

## 2021-04-08 DIAGNOSIS — R5381 Other malaise: Secondary | ICD-10-CM | POA: Diagnosis not present

## 2021-04-08 DIAGNOSIS — I509 Heart failure, unspecified: Secondary | ICD-10-CM | POA: Diagnosis not present

## 2021-04-08 DIAGNOSIS — G9341 Metabolic encephalopathy: Secondary | ICD-10-CM | POA: Diagnosis present

## 2021-04-08 DIAGNOSIS — I2699 Other pulmonary embolism without acute cor pulmonale: Secondary | ICD-10-CM | POA: Diagnosis not present

## 2021-04-08 DIAGNOSIS — F0390 Unspecified dementia without behavioral disturbance: Secondary | ICD-10-CM | POA: Diagnosis not present

## 2021-04-08 DIAGNOSIS — I63411 Cerebral infarction due to embolism of right middle cerebral artery: Secondary | ICD-10-CM | POA: Diagnosis not present

## 2021-04-08 DIAGNOSIS — N3281 Overactive bladder: Secondary | ICD-10-CM | POA: Diagnosis not present

## 2021-04-08 DIAGNOSIS — Z794 Long term (current) use of insulin: Secondary | ICD-10-CM | POA: Diagnosis not present

## 2021-04-08 DIAGNOSIS — R0689 Other abnormalities of breathing: Secondary | ICD-10-CM | POA: Diagnosis not present

## 2021-04-08 DIAGNOSIS — I10 Essential (primary) hypertension: Secondary | ICD-10-CM | POA: Diagnosis not present

## 2021-04-08 DIAGNOSIS — N1831 Chronic kidney disease, stage 3a: Secondary | ICD-10-CM | POA: Diagnosis not present

## 2021-04-08 DIAGNOSIS — I82413 Acute embolism and thrombosis of femoral vein, bilateral: Secondary | ICD-10-CM | POA: Diagnosis not present

## 2021-04-08 DIAGNOSIS — J189 Pneumonia, unspecified organism: Secondary | ICD-10-CM | POA: Diagnosis not present

## 2021-04-08 DIAGNOSIS — R778 Other specified abnormalities of plasma proteins: Secondary | ICD-10-CM

## 2021-04-08 DIAGNOSIS — J90 Pleural effusion, not elsewhere classified: Secondary | ICD-10-CM | POA: Diagnosis not present

## 2021-04-08 DIAGNOSIS — E43 Unspecified severe protein-calorie malnutrition: Secondary | ICD-10-CM | POA: Diagnosis not present

## 2021-04-08 DIAGNOSIS — E1129 Type 2 diabetes mellitus with other diabetic kidney complication: Secondary | ICD-10-CM | POA: Diagnosis not present

## 2021-04-08 DIAGNOSIS — R4182 Altered mental status, unspecified: Secondary | ICD-10-CM | POA: Diagnosis not present

## 2021-04-08 DIAGNOSIS — R627 Adult failure to thrive: Secondary | ICD-10-CM | POA: Diagnosis not present

## 2021-04-08 DIAGNOSIS — F32A Depression, unspecified: Secondary | ICD-10-CM | POA: Diagnosis not present

## 2021-04-08 DIAGNOSIS — R6510 Systemic inflammatory response syndrome (SIRS) of non-infectious origin without acute organ dysfunction: Secondary | ICD-10-CM | POA: Diagnosis not present

## 2021-04-08 DIAGNOSIS — R42 Dizziness and giddiness: Secondary | ICD-10-CM | POA: Diagnosis not present

## 2021-04-08 DIAGNOSIS — E1140 Type 2 diabetes mellitus with diabetic neuropathy, unspecified: Secondary | ICD-10-CM | POA: Diagnosis not present

## 2021-04-08 DIAGNOSIS — R1314 Dysphagia, pharyngoesophageal phase: Secondary | ICD-10-CM | POA: Diagnosis not present

## 2021-04-08 DIAGNOSIS — G8194 Hemiplegia, unspecified affecting left nondominant side: Secondary | ICD-10-CM | POA: Diagnosis not present

## 2021-04-08 DIAGNOSIS — N179 Acute kidney failure, unspecified: Secondary | ICD-10-CM | POA: Diagnosis not present

## 2021-04-08 DIAGNOSIS — S92301D Fracture of unspecified metatarsal bone(s), right foot, subsequent encounter for fracture with routine healing: Secondary | ICD-10-CM | POA: Diagnosis not present

## 2021-04-08 DIAGNOSIS — I82453 Acute embolism and thrombosis of peroneal vein, bilateral: Secondary | ICD-10-CM | POA: Diagnosis not present

## 2021-04-08 DIAGNOSIS — I639 Cerebral infarction, unspecified: Secondary | ICD-10-CM | POA: Diagnosis not present

## 2021-04-08 DIAGNOSIS — E440 Moderate protein-calorie malnutrition: Secondary | ICD-10-CM | POA: Diagnosis not present

## 2021-04-08 DIAGNOSIS — K5900 Constipation, unspecified: Secondary | ICD-10-CM | POA: Diagnosis not present

## 2021-04-08 DIAGNOSIS — I959 Hypotension, unspecified: Secondary | ICD-10-CM | POA: Diagnosis not present

## 2021-04-08 DIAGNOSIS — I130 Hypertensive heart and chronic kidney disease with heart failure and stage 1 through stage 4 chronic kidney disease, or unspecified chronic kidney disease: Secondary | ICD-10-CM | POA: Diagnosis not present

## 2021-04-08 DIAGNOSIS — I160 Hypertensive urgency: Secondary | ICD-10-CM | POA: Diagnosis not present

## 2021-04-08 DIAGNOSIS — J9811 Atelectasis: Secondary | ICD-10-CM | POA: Diagnosis not present

## 2021-04-08 DIAGNOSIS — Z8673 Personal history of transient ischemic attack (TIA), and cerebral infarction without residual deficits: Secondary | ICD-10-CM | POA: Diagnosis not present

## 2021-04-08 DIAGNOSIS — I82443 Acute embolism and thrombosis of tibial vein, bilateral: Secondary | ICD-10-CM | POA: Diagnosis not present

## 2021-04-08 DIAGNOSIS — S92344D Nondisplaced fracture of fourth metatarsal bone, right foot, subsequent encounter for fracture with routine healing: Secondary | ICD-10-CM | POA: Diagnosis not present

## 2021-04-08 DIAGNOSIS — D649 Anemia, unspecified: Secondary | ICD-10-CM | POA: Diagnosis not present

## 2021-04-08 DIAGNOSIS — F419 Anxiety disorder, unspecified: Secondary | ICD-10-CM | POA: Diagnosis not present

## 2021-04-08 DIAGNOSIS — R278 Other lack of coordination: Secondary | ICD-10-CM | POA: Diagnosis not present

## 2021-04-08 LAB — COMPREHENSIVE METABOLIC PANEL
ALT: 13 U/L (ref 0–44)
AST: 16 U/L (ref 15–41)
Albumin: 2.9 g/dL — ABNORMAL LOW (ref 3.5–5.0)
Alkaline Phosphatase: 116 U/L (ref 38–126)
Anion gap: 9 (ref 5–15)
BUN: 19 mg/dL (ref 8–23)
CO2: 20 mmol/L — ABNORMAL LOW (ref 22–32)
Calcium: 8.7 mg/dL — ABNORMAL LOW (ref 8.9–10.3)
Chloride: 109 mmol/L (ref 98–111)
Creatinine, Ser: 1.18 mg/dL — ABNORMAL HIGH (ref 0.44–1.00)
GFR, Estimated: 44 mL/min — ABNORMAL LOW (ref >60)
Glucose, Bld: 131 mg/dL — ABNORMAL HIGH (ref 70–99)
Potassium: 3.7 mmol/L (ref 3.5–5.1)
Sodium: 138 mmol/L (ref 135–145)
Total Bilirubin: 0.5 mg/dL (ref 0.3–1.2)
Total Protein: 6.2 g/dL — ABNORMAL LOW (ref 6.5–8.1)

## 2021-04-08 LAB — CBC WITH DIFFERENTIAL/PLATELET
Abs Immature Granulocytes: 0.15 10*3/uL — ABNORMAL HIGH (ref 0.00–0.07)
Basophils Absolute: 0.1 10*3/uL (ref 0.0–0.1)
Basophils Relative: 1 %
Eosinophils Absolute: 0.7 10*3/uL — ABNORMAL HIGH (ref 0.0–0.5)
Eosinophils Relative: 7 %
HCT: 35.8 % — ABNORMAL LOW (ref 36.0–46.0)
Hemoglobin: 10.9 g/dL — ABNORMAL LOW (ref 12.0–15.0)
Immature Granulocytes: 1 %
Lymphocytes Relative: 16 %
Lymphs Abs: 1.7 10*3/uL (ref 0.7–4.0)
MCH: 27.3 pg (ref 26.0–34.0)
MCHC: 30.4 g/dL (ref 30.0–36.0)
MCV: 89.7 fL (ref 80.0–100.0)
Monocytes Absolute: 0.6 10*3/uL (ref 0.1–1.0)
Monocytes Relative: 6 %
Neutro Abs: 7.6 10*3/uL (ref 1.7–7.7)
Neutrophils Relative %: 69 %
Platelets: 285 10*3/uL (ref 150–400)
RBC: 3.99 MIL/uL (ref 3.87–5.11)
RDW: 15.7 % — ABNORMAL HIGH (ref 11.5–15.5)
WBC: 10.9 10*3/uL — ABNORMAL HIGH (ref 4.0–10.5)
nRBC: 0 % (ref 0.0–0.2)

## 2021-04-08 LAB — MAGNESIUM: Magnesium: 1.5 mg/dL — ABNORMAL LOW (ref 1.7–2.4)

## 2021-04-08 LAB — PHOSPHORUS: Phosphorus: 2.6 mg/dL (ref 2.5–4.6)

## 2021-04-08 LAB — GLUCOSE, CAPILLARY
Glucose-Capillary: 128 mg/dL — ABNORMAL HIGH (ref 70–99)
Glucose-Capillary: 153 mg/dL — ABNORMAL HIGH (ref 70–99)

## 2021-04-08 MED ORDER — MAGNESIUM SULFATE 2 GM/50ML IV SOLN
2.0000 g | Freq: Once | INTRAVENOUS | Status: AC
  Administered 2021-04-08: 2 g via INTRAVENOUS
  Filled 2021-04-08: qty 50

## 2021-04-08 MED ORDER — METOPROLOL TARTRATE 25 MG PO TABS: 12.5000 mg | ORAL_TABLET | Freq: Two times a day (BID) | ORAL | 0 refills | Status: AC

## 2021-04-08 MED ORDER — HYDRALAZINE HCL 25 MG PO TABS: 50.0000 mg | ORAL_TABLET | Freq: Three times a day (TID) | ORAL | 1 refills | Status: DC

## 2021-04-08 NOTE — Care Management Important Message (Signed)
Important Message  Patient Details IM Letter placed in Patients room. Name: Lisa Villegas MRN: [id number] Date of Birth: 1930-07-20   Medicare Important Message Given:  Yes     Kerin Salen 04/08/2021, 11:43 AM

## 2021-04-08 NOTE — Discharge Summary (Signed)
Physician Discharge Summary  Lisa Villegas VFI:[id number] DOB: 15-Jul-1930 DOA: 03/30/2021  PCP: [name], MD  Admit date: 03/30/2021 Discharge date: 04/08/2021  Time spent: 40 minutes  Recommendations for Outpatient Follow-up:  Follow outpatient CBC/CMP/Mg Arrange orthopedics follow up outpatient  Follow blood pressure outpatient Lisa Villegas, legal guarding, requesting attention to dentures at facility  Discharge Diagnoses:  Principal Problem:   AKI (acute kidney injury) ([place]) Active Problems:   Hypomagnesemia   Multiple closed fractures of metatarsal bone of right foot   Hypertension   HTN (hypertension), benign   Type II diabetes mellitus with renal manifestations (HCC)   Elevated troponin   UTI (urinary tract infection)   Anemia   Autism   CKD (chronic kidney disease), stage III (HCC)   Fracture of unsp metatarsal bone(s), right foot, init   Malnutrition of moderate degree   Discharge Condition: stable  Diet recommendation: heart healthy  Filed Weights   04/01/21 1056  Weight: 58.4 kg    History of present illness:  Patient is Lisa Villegas 88 year old female history of anemia, anxiety, chronic diastolic CHF, stage IIIa CKD, type 2 diabetes, diabetic neuropathy, thyroid nodule, osteopenia, history of nonhemorrhagic CVA sent from facility secondary to dark stools, decreased appetite, inability to walk due to injury to the right foot.  Patient noted to be taking 2 different iron supplementation pills.  Hemoglobin normal.  Work-up in the ED with urinalysis concerning for UTI.  Troponin is flattened but elevated at 49 and 47.  Chest x-ray unremarkable.  Plain films of the right foot with multiple metatarsal fractures.  Orthopedics consulted and recommended WBAT with post op shoe and outpatient follow up.  She was treated with antibiotics for [name] UTI.  She's now stable for discharge to SNF.    See below for additional details.    Hospital Course:  * AKI (acute kidney injury)  ([place])- (present on admission) Improved, fluctuating Started on bicarb for NAGMA    Hypomagnesemia- (present on admission) Replace and follow  Multiple closed fractures of metatarsal bone of right foot Orthopedics recommending post op shoe and WBAT Therapy recommending SNF Plain films 1/3 with mildly comminuted fx of proximal phalanx of first digit, displaced fx of 3rd, 4th, and 5th metatarsal necks which may be chronic process Follow up with orthopedics 1-2 weeks after discharge - Call [phone] to arrange office follow up with Dr. Griffin Basil or Noemi Chapel, PA-C  Hypertension- (present on admission) Metop, verapamil, hydral - continue to adjust meds as needed  Elevated troponin Mild and flat No chest pain Follow outpatient as indicated  Type II diabetes mellitus with renal manifestations ([place])- (present on admission) a1c 6.7 SSI Resume home regimen at discharge (lantus and metformin at home)  UTI (urinary tract infection) S/p 7 days for proteus and e. Coli UTI's  Anemia- (present on admission) Stable She's on iron supplementation  Autism- (present on admission) noted   Procedures: none   Consultations: orthopedics  Discharge Exam: Vitals:   04/08/21 0318 04/08/21 0922  BP: (!) 189/78 (!) 152/52  Pulse: 73 79  Resp: 20 19  Temp: 97.7 F (36.5 C) 98.1 F (36.7 C)  SpO2: 95% 97%   Denies any complaints  General: No acute distress. Cardiovascular: RRR Lungs: unlabored Abdomen: Soft, nontender, nondistended  Neurological: answers some questions, but inconsistently  Skin: Warm and dry. No rashes or lesions. Extremities: No clubbing or cyanosis. No edema.   Discharge Instructions   Discharge Instructions     Call MD for:  difficulty breathing, headache  or visual disturbances   Complete by: As directed    Call MD for:  extreme fatigue   Complete by: As directed    Call MD for:  hives   Complete by: As directed    Call MD for:  persistant  dizziness or light-headedness   Complete by: As directed    Call MD for:  persistant nausea and vomiting   Complete by: As directed    Call MD for:  redness, tenderness, or signs of infection (pain, swelling, redness, odor or green/yellow discharge around incision site)   Complete by: As directed    Call MD for:  severe uncontrolled pain   Complete by: As directed    Call MD for:  temperature >100.4   Complete by: As directed    Diet - low sodium heart healthy   Complete by: As directed    Diet Carb Modified   Complete by: As directed    Discharge instructions   Complete by: As directed    You were seen for right foot pain/injury and decreased appetite and dark stools.  You had [name] urinary tract infection that has been treated.  You had fractures of the right foot.  You were seen by ortho who recommended weightbearing as tolerated and [name] post op shoe.  You'll need to follow up with orthopedics as an outpatient.  Your dark stools are likely related to your iron use.  Your blood counts were stable.    Your blood pressures were elevated here and we've adjusted your blood pressure medicines.  You'll need continued follow up to continue to adjust your blood pressure meds at the facility.    Return for new, recurrent, or worsening symptoms.  Please ask your PCP to request records from this hospitalization so they know what was done and what the next steps will be.   Increase activity slowly   Complete by: As directed    Increase activity slowly   Complete by: As directed       Allergies as of 04/08/2021       Reactions   Ciprofloxacin Rash   Localized red rash to IV site   Penicillins Rash   On MAR: Tolerates multiple cephalosporins, including cephalexin.        Medication List     TAKE these medications    acetaminophen 325 MG tablet Commonly known as: TYLENOL Take 2 tablets (650 mg total) by mouth every 6 (six) hours as needed for mild pain (or Fever >/= 101).    aspirin 81 MG tablet Take 1 tablet (81 mg total) by mouth daily.   blood glucose meter kit and supplies Dispense based on patient and insurance preference. Use up to four times daily as directed. (FOR ICD-10 E10.9, E11.9).   ferrous sulfate 325 (65 FE) MG tablet Take 325 mg by mouth daily.   hydrALAZINE 25 MG tablet Commonly known as: APRESOLINE Take 2 tablets (50 mg total) by mouth 3 (three) times daily. What changed:  medication strength how much to take Another medication with the same name was removed. Continue taking this medication, and follow the directions you see here.   Integra 62.5-62.5-40-3 MG Caps Take 1 capsule by mouth daily.   Lantus SoloStar 100 UNIT/ML Solostar Pen Generic drug: insulin glargine Inject 12 Units into the skin at bedtime. What changed: how much to take   metFORMIN 500 MG tablet Commonly known as: GLUCOPHAGE Take 1 tablet (500 mg total) by mouth See admin instructions. Take 105m in  the morning and 571m at night. What changed:  how much to take when to take this additional instructions   metoprolol tartrate 25 MG tablet Commonly known as: LOPRESSOR Take 0.5 tablets (12.5 mg total) by mouth 2 (two) times daily.   omeprazole 40 MG capsule Commonly known as: PRILOSEC Take 40 mg by mouth daily.   oxybutynin 5 MG tablet Commonly known as: DITROPAN Take 5 mg by mouth 2 (two) times daily.   oxyCODONE 5 MG immediate release tablet Commonly known as: Oxy IR/ROXICODONE Take 0.5 tablets (2.5 mg total) by mouth every 4 (four) hours as needed for moderate pain.   PREVIDENT 5000 BOOSTER PLUS DT Place 1 application onto teeth every evening.   senna-docusate 8.6-50 MG tablet Commonly known as: Senokot S Take 1 tablet by mouth 2 (two) times daily.   SENSODYNE COMPLETE PROTECTION MT Use as directed in the mouth or throat. Mint,place one application onto teeth daily as directed   verapamil 120 MG CR tablet Commonly known as: CALAN-SR Take  120 mg by mouth at bedtime.       ASK your doctor about these medications    sodium bicarbonate 650 MG tablet Take 1 tablet (650 mg total) by mouth 2 (two) times daily for 5 days. Ask about: Should I take this medication?       Allergies  Allergen Reactions   Ciprofloxacin Rash    Localized red rash to IV site   Penicillins Rash    On MAR: Tolerates multiple cephalosporins, including cephalexin.    Contact information for follow-up providers     RMerrilee Seashore MD. Schedule an appointment as soon as possible for Gedalya Jim visit in 2 week(s).   Specialty: Internal Medicine Contact information: [address] [phone]         VHiram Gash MD. Schedule an appointment as soon as possible for Harlon Kutner visit in 1 week(s).   Specialty: Orthopedic Surgery Why: Follow-up in 1 to 2 weeks. Contact information: [phone]              Contact information for after-discharge care     [facility]   Service: Skilled Nursing Contact information: [phone]                     The results of significant diagnostics from this hospitalization (including imaging, microbiology, ancillary and laboratory) are listed below for reference.    Significant Diagnostic Studies: CT Head Wo Contrast  Result Date: 03/24/2021 CLINICAL DATA:  Head trauma, minor (Age >= 65y) EXAM: CT HEAD WITHOUT CONTRAST TECHNIQUE: Contiguous axial images were obtained from the base of the skull through the vertex without intravenous contrast. COMPARISON:  CT head September 17, 2019. FINDINGS: Brain: No evidence of acute infarction, hemorrhage, hydrocephalus, extra-axial collection or mass lesion/mass effect. Similar remote cortical infarct in the left frontal operculum. Similar generalized  atrophy and chronic microvascular ischemic disease. Vascular: No hyperdense vessel identified. Calcific intracranial atherosclerosis. Skull: No acute fracture. Sinuses/Orbits: Mild bilateral sphenoid sinus mucosal thickening and small amount of frothy secretions within the left sphenoid sinus. Other: No mastoid effusions.  Forehead scalp lipoma. IMPRESSION: 1. No evidence of acute intracranial abnormality. 2. Similar remote left frontal cortical infarct, generalized atrophy and chronic microvascular ischemic disease. Electronically Signed   By: FMargaretha Sheffield  M.D.   On: 03/24/2021 15:01   DG Chest Portable 1 View  Result Date: 03/30/2021 CLINICAL DATA:  Weakness EXAM: PORTABLE CHEST 1 VIEW COMPARISON:  12/04/2019 FINDINGS: The heart size and mediastinal contours are within normal limits. Both lungs are clear. No pleural effusion. IMPRESSION: No acute process in the chest. Electronically Signed   By: Macy Mis M.D.   On: 03/30/2021 10:20   DG Foot Complete Right  Result Date: 03/30/2021 CLINICAL DATA:  Swelling and bruising, patient has autism. EXAM: RIGHT FOOT COMPLETE - 3+ VIEW COMPARISON:  None. FINDINGS: Mildly displaced comminuted fracture of the proximal phalanx of the first digit. There are displaced fracture of the third, fourth and fifth metatarsal necks, likely Kyon Bentler chronic process. 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 Soft tissue swelling about the dorsum of the foot. Prominent vascular calcifications. IMPRESSION: 1. Mildly displaced comminuted fracture of the proximal phalanx of the first digit. 2. Displaced fractures of the third, fourth and fifth metatarsal necks, which may be Eryka Dolinger chronic process. Correlate with prior imaging and clinical history. 3. 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 4. Soft tissue swelling about the dorsum of the foot. Electronically Signed   By: Keane Police D.O.   On: 03/30/2021 11:43    Microbiology: Recent Results (from  the past 240 hour(s))  Resp Panel by RT-PCR (Flu Etherine Mackowiak&B, Covid) Nasopharyngeal Swab     Status: None   Collection Time: 03/30/21 11:00 AM   Specimen: Nasopharyngeal Swab; Nasopharyngeal(NP) swabs in vial transport medium  Result Value Ref Range Status   SARS Coronavirus 2 by RT PCR NEGATIVE NEGATIVE Final    Comment: (NOTE) SARS-CoV-2 target nucleic acids are NOT DETECTED.  The SARS-CoV-2 RNA is generally detectable in upper respiratory specimens during the acute phase of infection. The lowest concentration of SARS-CoV-2 viral copies this assay can detect is 138 copies/mL. Leamon Palau negative result does not preclude SARS-Cov-2 infection and should not be used as the sole basis for treatment or other patient management decisions. Kaydee Magel negative result may occur with  improper specimen collection/handling, submission of specimen other than nasopharyngeal swab, presence of viral mutation(s) within the areas targeted by this assay, and inadequate number of viral copies(<138 copies/mL). Shalayah Beagley negative result must be combined with clinical observations, patient history, and epidemiological information. The expected result is Negative.  Fact Sheet for Patients:  EntrepreneurPulse.com.au  Fact Sheet for Healthcare Providers:  IncredibleEmployment.be  This test is no t yet approved or cleared by the [place] FDA and  has been authorized for detection and/or diagnosis of SARS-CoV-2 by FDA under an Emergency Use Authorization (EUA). This EUA will remain  in effect (meaning this test can be used) for the duration of the COVID-19 declaration under Section 564(b)(1) of the Act, 21 U.S.C.section 360bbb-3(b)(1), unless the authorization is terminated  or revoked sooner.       Influenza Casia Corti by PCR NEGATIVE NEGATIVE Final   Influenza B by PCR NEGATIVE NEGATIVE Final    Comment: (NOTE) The Xpert Xpress SARS-CoV-2/FLU/RSV plus assay is intended as an aid in the diagnosis of  influenza from Nasopharyngeal swab specimens and should not be used as Dyanara Cozza sole basis for treatment. Nasal washings and aspirates are unacceptable for Xpert Xpress SARS-CoV-2/FLU/RSV testing.  Fact Sheet for Patients: EntrepreneurPulse.com.au  Fact Sheet for Healthcare Providers: IncredibleEmployment.be  This test is not yet approved or cleared by the [place] FDA and has been authorized for detection and/or diagnosis of SARS-CoV-2 by FDA under an Emergency Use Authorization (  EUA). This EUA will remain in effect (meaning this test can be used) for the duration of the COVID-19 declaration under Section 564(b)(1) of the Act, 21 U.S.C. section 360bbb-3(b)(1), unless the authorization is terminated or revoked.  Performed at [facility], [place] [address] [place], [place] [address]   Urine Culture     Status: Abnormal   Collection Time: 03/30/21  4:04 PM   Specimen: Urine, Clean Catch  Result Value Ref Range Status   Specimen Description   Final    URINE, CLEAN CATCH Performed at [facility], [place] [address] [place], [place] [address]    Special Requests   Final    NONE Performed at [facility], [place] [address], [place], [place] [address]    Culture (Ellyana Crigler)  Final    >=100,000 COLONIES/mL PROTEUS MIRABILIS 50,000 COLONIES/mL ESCHERICHIA COLI    Report Status 04/04/2021 FINAL  Final   Organism ID, Bacteria PROTEUS MIRABILIS ([place])  Final   Organism ID, Bacteria ESCHERICHIA COLI ([place])  Final      Susceptibility   Escherichia coli - MIC*    AMPICILLIN <=2 SENSITIVE Sensitive     CEFAZOLIN <=4 SENSITIVE Sensitive     CEFEPIME <=0.12 SENSITIVE Sensitive     CEFTRIAXONE <=0.25 SENSITIVE Sensitive     CIPROFLOXACIN <=0.25 SENSITIVE Sensitive     GENTAMICIN <=1 SENSITIVE Sensitive     IMIPENEM <=0.25 SENSITIVE Sensitive     NITROFURANTOIN <=16 SENSITIVE Sensitive     TRIMETH/SULFA <=20  SENSITIVE Sensitive     AMPICILLIN/SULBACTAM <=2 SENSITIVE Sensitive     PIP/TAZO <=4 SENSITIVE Sensitive     * 50,000 COLONIES/mL ESCHERICHIA COLI   Proteus mirabilis - MIC*    AMPICILLIN <=2 SENSITIVE Sensitive     CEFAZOLIN <=4 SENSITIVE Sensitive     CEFEPIME <=0.12 SENSITIVE Sensitive     CEFTRIAXONE <=0.25 SENSITIVE Sensitive     CIPROFLOXACIN <=0.25 SENSITIVE Sensitive     GENTAMICIN <=1 SENSITIVE Sensitive     IMIPENEM 2 SENSITIVE Sensitive     NITROFURANTOIN 128 RESISTANT Resistant     TRIMETH/SULFA <=20 SENSITIVE Sensitive     AMPICILLIN/SULBACTAM <=2 SENSITIVE Sensitive     PIP/TAZO <=4 SENSITIVE Sensitive     * >=100,000 COLONIES/mL PROTEUS MIRABILIS  MRSA Next Gen by PCR, Nasal     Status: None   Collection Time: 03/31/21  5:32 PM   Specimen: Nasal Mucosa; Nasal Swab  Result Value Ref Range Status   MRSA by PCR Next Gen NOT DETECTED NOT DETECTED Final    Comment: (NOTE) The GeneXpert MRSA Assay (FDA approved for NASAL specimens only), is one component of Remus Hagedorn comprehensive MRSA colonization surveillance program. It is not intended to diagnose MRSA infection nor to guide or monitor treatment for MRSA infections. Test performance is not FDA approved in patients less than 60 years old. Performed at [facility], [place] [address], [place], [place] [address]   Resp Panel by RT-PCR (Flu Trace Cederberg&B, Covid) Nasopharyngeal Swab     Status: None   Collection Time: 04/02/21 12:08 PM   Specimen: Nasopharyngeal Swab; Nasopharyngeal(NP) swabs in vial transport medium  Result Value Ref Range Status   SARS Coronavirus 2 by RT PCR NEGATIVE NEGATIVE Final    Comment: (NOTE) SARS-CoV-2 target nucleic acids are NOT DETECTED.  The SARS-CoV-2 RNA is generally detectable in upper respiratory specimens during the acute phase of infection. The lowest concentration of SARS-CoV-2 viral copies this assay can detect is 138 copies/mL. Latori Beggs  negative result does not preclude  SARS-Cov-2 infection and should not be used as the sole basis for treatment or other patient management decisions. Searcy Miyoshi negative result may occur with  improper specimen collection/handling, submission of specimen other than nasopharyngeal swab, presence of viral mutation(s) within the areas targeted by this assay, and inadequate number of viral copies(<138 copies/mL). Shahara Hartsfield negative result must be combined with clinical observations, patient history, and epidemiological information. The expected result is Negative.  Fact Sheet for Patients:  EntrepreneurPulse.com.au  Fact Sheet for Healthcare Providers:  IncredibleEmployment.be  This test is no t yet approved or cleared by the [place] FDA and  has been authorized for detection and/or diagnosis of SARS-CoV-2 by FDA under an Emergency Use Authorization (EUA). This EUA will remain  in effect (meaning this test can be used) for the duration of the COVID-19 declaration under Section 564(b)(1) of the Act, 21 U.S.C.section 360bbb-3(b)(1), unless the authorization is terminated  or revoked sooner.       Influenza Jamaury Gumz by PCR NEGATIVE NEGATIVE Final   Influenza B by PCR NEGATIVE NEGATIVE Final    Comment: (NOTE) The Xpert Xpress SARS-CoV-2/FLU/RSV plus assay is intended as an aid in the diagnosis of influenza from Nasopharyngeal swab specimens and should not be used as Jacorie Ernsberger sole basis for treatment. Nasal washings and aspirates are unacceptable for Xpert Xpress SARS-CoV-2/FLU/RSV testing.  Fact Sheet for Patients: EntrepreneurPulse.com.au  Fact Sheet for Healthcare Providers: IncredibleEmployment.be  This test is not yet approved or cleared by the [place] FDA and has been authorized for detection and/or diagnosis of SARS-CoV-2 by FDA under an Emergency Use Authorization (EUA). This EUA will remain in effect (meaning this test can be used) for the duration of  the COVID-19 declaration under Section 564(b)(1) of the Act, 21 U.S.C. section 360bbb-3(b)(1), unless the authorization is terminated or revoked.  Performed at [facility], [place] [address], [place], [place] [address]   Resp Panel by RT-PCR (Flu Sharina Petre&B, Covid) Nasopharyngeal Swab     Status: None   Collection Time: 04/07/21 10:00 PM   Specimen: Nasopharyngeal Swab; Nasopharyngeal(NP) swabs in vial transport medium  Result Value Ref Range Status   SARS Coronavirus 2 by RT PCR NEGATIVE NEGATIVE Final    Comment: (NOTE) SARS-CoV-2 target nucleic acids are NOT DETECTED.  The SARS-CoV-2 RNA is generally detectable in upper respiratory specimens during the acute phase of infection. The lowest concentration of SARS-CoV-2 viral copies this assay can detect is 138 copies/mL. Khalik Pewitt negative result does not preclude SARS-Cov-2 infection and should not be used as the sole basis for treatment or other patient management decisions. Eliezer Khawaja negative result may occur with  improper specimen collection/handling, submission of specimen other than nasopharyngeal swab, presence of viral mutation(s) within the areas targeted by this assay, and inadequate number of viral copies(<138 copies/mL). Clary Boulais negative result must be combined with clinical observations, patient history, and epidemiological information. The expected result is Negative.  Fact Sheet for Patients:  EntrepreneurPulse.com.au  Fact Sheet for Healthcare Providers:  IncredibleEmployment.be  This test is no t yet approved or cleared by the [place] FDA and  has been authorized for detection and/or diagnosis of SARS-CoV-2 by FDA under an Emergency Use Authorization (EUA). This EUA will remain  in effect (meaning this test can be used) for the duration of the COVID-19 declaration under Section 564(b)(1) of the Act, 21 U.S.C.section 360bbb-3(b)(1), unless the authorization is terminated  or  revoked sooner.       Influenza Ariannie Penaloza by  PCR NEGATIVE NEGATIVE Final   Influenza B by PCR NEGATIVE NEGATIVE Final    Comment: (NOTE) The Xpert Xpress SARS-CoV-2/FLU/RSV plus assay is intended as an aid in the diagnosis of influenza from Nasopharyngeal swab specimens and should not be used as Kennethia Lynes sole basis for treatment. Nasal washings and aspirates are unacceptable for Xpert Xpress SARS-CoV-2/FLU/RSV testing.  Fact Sheet for Patients: EntrepreneurPulse.com.au  Fact Sheet for Healthcare Providers: IncredibleEmployment.be  This test is not yet approved or cleared by the [place] FDA and has been authorized for detection and/or diagnosis of SARS-CoV-2 by FDA under an Emergency Use Authorization (EUA). This EUA will remain in effect (meaning this test can be used) for the duration of the COVID-19 declaration under Section 564(b)(1) of the Act, 21 U.S.C. section 360bbb-3(b)(1), unless the authorization is terminated or revoked.  Performed at [facility], [place] [address] [place], [place] [address]      Labs: Basic Metabolic Panel: Recent Labs  Lab 04/02/21 0520 04/03/21 0548 04/04/21 0551 04/05/21 0501 04/06/21 0933 04/07/21 0444 04/08/21 0446  NA 134*   < > 137 134* 134* 134* 138  K 3.7   < > 3.8 4.1 4.8 4.2 3.7  CL 107   < > 107 105 108 110 109  CO2 17*   < > 21* 22 18* 18* 20*  GLUCOSE 86   < > 74 137* 184* 114* 131*  BUN 23   < > 23 32* 25* 22 19  CREATININE 1.20*   < > 1.32* 1.75* 1.34* 1.07* 1.18*  CALCIUM 8.3*   < > 8.8* 8.7* 8.4* 8.3* 8.7*  MG 1.7  --  1.6* 2.2  --   --  1.5*  PHOS  --   --   --   --   --   --  2.6   < > = values in this interval not displayed.   Liver Function Tests: Recent Labs  Lab 04/08/21 0446  AST 16  ALT 13  ALKPHOS 116  BILITOT 0.5  PROT 6.2*  ALBUMIN 2.9*   No results for input(s): LIPASE, AMYLASE in the last 168 hours. No results for input(s): AMMONIA in the last 168  hours. CBC: Recent Labs  Lab 04/02/21 0520 04/03/21 0548 04/04/21 0551 04/05/21 0501 04/07/21 0444 04/08/21 0446  WBC 10.1 10.5 7.1 8.7  --  10.9*  NEUTROABS  --   --   --   --   --  7.6  HGB 11.1* 12.1 10.9* 10.8* 9.9* 10.9*  HCT 35.7* 37.8 35.4* 33.4* 30.7* 35.8*  MCV 87.5 84.4 87.2 85.9  --  89.7  PLT 272 310 285 298  --  285   Cardiac Enzymes: No results for input(s): CKTOTAL, CKMB, CKMBINDEX, TROPONINI in the last 168 hours. BNP: BNP (last 3 results) No results for input(s): BNP in the last 8760 hours.  ProBNP (last 3 results) No results for input(s): PROBNP in the last 8760 hours.  CBG: Recent Labs  Lab 04/07/21 0748 04/07/21 1325 04/07/21 1651 04/07/21 2022 04/08/21 0802  GLUCAP 101* 156* 113* 150* 128*       Signed:  Fayrene Helper MD.  [facility] 04/08/2021, 9:36 AM

## 2021-04-08 NOTE — Assessment & Plan Note (Signed)
Mild and flat No chest pain Follow outpatient as indicated

## 2021-04-08 NOTE — TOC Transition Note (Signed)
Transition of Care [facility]) - CM/SW Discharge Note   Patient Details  Name: Lisa Villegas MRN: [id number] Date of Birth: November 08, 1930  [facility]) CM/SW Contact:  Lynnell Catalan, RN Phone Number: 04/08/2021, 11:34 AM   Clinical Narrative:    Pt to dc to [facility] room 36 today. [facility] contacted for transport. Yellow DNR on the chart for DC. RN to call report to [phone].   Final next level of care: Skilled Nursing Facility Barriers to Discharge: Barriers Resolved   Patient Goals and CMS Choice     Choice offered to / list presented to : [facility] POA / Guardian  Discharge Plan and Services In-house Referral: Clinical Social Work           Readmission Risk Interventions Readmission Risk Prevention Plan 08/01/2019 04/02/2019  Transportation Screening Complete Complete  PCP or Specialist Appt within 3-5 Days Complete Complete  HRI or Home Care Consult Complete Complete  Social Work Consult for [place] Planning/Counseling Complete Complete  Palliative Care Screening Complete Not Applicable  Medication Review [employer]) Complete Referral to Pharmacy  Some recent data might be hidden

## 2021-04-08 NOTE — Assessment & Plan Note (Signed)
noted 

## 2021-04-12 DIAGNOSIS — R5381 Other malaise: Secondary | ICD-10-CM | POA: Diagnosis not present

## 2021-04-12 DIAGNOSIS — S92901A Unspecified fracture of right foot, initial encounter for closed fracture: Secondary | ICD-10-CM | POA: Diagnosis not present

## 2021-04-12 DIAGNOSIS — I509 Heart failure, unspecified: Secondary | ICD-10-CM | POA: Diagnosis not present

## 2021-04-12 DIAGNOSIS — I10 Essential (primary) hypertension: Secondary | ICD-10-CM | POA: Diagnosis not present

## 2021-04-12 DIAGNOSIS — E119 Type 2 diabetes mellitus without complications: Secondary | ICD-10-CM | POA: Diagnosis not present

## 2021-04-12 DIAGNOSIS — E440 Moderate protein-calorie malnutrition: Secondary | ICD-10-CM | POA: Diagnosis not present

## 2021-04-13 DIAGNOSIS — S92354D Nondisplaced fracture of fifth metatarsal bone, right foot, subsequent encounter for fracture with routine healing: Secondary | ICD-10-CM | POA: Diagnosis not present

## 2021-04-13 DIAGNOSIS — S92334D Nondisplaced fracture of third metatarsal bone, right foot, subsequent encounter for fracture with routine healing: Secondary | ICD-10-CM | POA: Diagnosis not present

## 2021-04-13 DIAGNOSIS — S92344D Nondisplaced fracture of fourth metatarsal bone, right foot, subsequent encounter for fracture with routine healing: Secondary | ICD-10-CM | POA: Diagnosis not present

## 2021-04-14 DIAGNOSIS — E119 Type 2 diabetes mellitus without complications: Secondary | ICD-10-CM | POA: Diagnosis not present

## 2021-04-14 DIAGNOSIS — S92301D Fracture of unspecified metatarsal bone(s), right foot, subsequent encounter for fracture with routine healing: Secondary | ICD-10-CM | POA: Diagnosis not present

## 2021-04-14 DIAGNOSIS — N3281 Overactive bladder: Secondary | ICD-10-CM | POA: Diagnosis not present

## 2021-04-14 DIAGNOSIS — R5381 Other malaise: Secondary | ICD-10-CM | POA: Diagnosis not present

## 2021-04-14 DIAGNOSIS — N390 Urinary tract infection, site not specified: Secondary | ICD-10-CM | POA: Diagnosis not present

## 2021-04-14 DIAGNOSIS — K5900 Constipation, unspecified: Secondary | ICD-10-CM | POA: Diagnosis not present

## 2021-04-14 DIAGNOSIS — I10 Essential (primary) hypertension: Secondary | ICD-10-CM | POA: Diagnosis not present

## 2021-04-14 DIAGNOSIS — D649 Anemia, unspecified: Secondary | ICD-10-CM | POA: Diagnosis not present

## 2021-04-14 DIAGNOSIS — K219 Gastro-esophageal reflux disease without esophagitis: Secondary | ICD-10-CM | POA: Diagnosis not present

## 2021-04-14 DIAGNOSIS — N179 Acute kidney failure, unspecified: Secondary | ICD-10-CM | POA: Diagnosis not present

## 2021-04-14 DIAGNOSIS — N189 Chronic kidney disease, unspecified: Secondary | ICD-10-CM | POA: Diagnosis not present

## 2021-04-19 ENCOUNTER — Emergency Department (HOSPITAL_COMMUNITY): Payer: Medicare Other

## 2021-04-19 ENCOUNTER — Inpatient Hospital Stay (HOSPITAL_COMMUNITY): Payer: Medicare Other

## 2021-04-19 ENCOUNTER — Encounter (HOSPITAL_COMMUNITY): Payer: Self-pay | Admitting: Emergency Medicine

## 2021-04-19 ENCOUNTER — Inpatient Hospital Stay (HOSPITAL_COMMUNITY)
Admission: EM | Admit: 2021-04-19 | Discharge: 2021-04-27 | DRG: 064 | Disposition: A | Discharge status: Skilled Nursing Facility | Payer: Medicare Other | Source: Skilled Nursing Facility | Attending: Internal Medicine | Admitting: Internal Medicine

## 2021-04-19 DIAGNOSIS — Z7982 Long term (current) use of aspirin: Secondary | ICD-10-CM

## 2021-04-19 DIAGNOSIS — I82409 Acute embolism and thrombosis of unspecified deep veins of unspecified lower extremity: Secondary | ICD-10-CM

## 2021-04-19 DIAGNOSIS — I82453 Acute embolism and thrombosis of peroneal vein, bilateral: Secondary | ICD-10-CM | POA: Diagnosis present

## 2021-04-19 DIAGNOSIS — I130 Hypertensive heart and chronic kidney disease with heart failure and stage 1 through stage 4 chronic kidney disease, or unspecified chronic kidney disease: Secondary | ICD-10-CM | POA: Diagnosis present

## 2021-04-19 DIAGNOSIS — I2699 Other pulmonary embolism without acute cor pulmonale: Secondary | ICD-10-CM | POA: Diagnosis present

## 2021-04-19 DIAGNOSIS — G8194 Hemiplegia, unspecified affecting left nondominant side: Secondary | ICD-10-CM | POA: Diagnosis present

## 2021-04-19 DIAGNOSIS — F419 Anxiety disorder, unspecified: Secondary | ICD-10-CM | POA: Diagnosis present

## 2021-04-19 DIAGNOSIS — R4701 Aphasia: Secondary | ICD-10-CM | POA: Diagnosis present

## 2021-04-19 DIAGNOSIS — I160 Hypertensive urgency: Secondary | ICD-10-CM | POA: Diagnosis present

## 2021-04-19 DIAGNOSIS — Z8673 Personal history of transient ischemic attack (TIA), and cerebral infarction without residual deficits: Secondary | ICD-10-CM

## 2021-04-19 DIAGNOSIS — Z7984 Long term (current) use of oral hypoglycemic drugs: Secondary | ICD-10-CM

## 2021-04-19 DIAGNOSIS — R6510 Systemic inflammatory response syndrome (SIRS) of non-infectious origin without acute organ dysfunction: Secondary | ICD-10-CM | POA: Diagnosis present

## 2021-04-19 DIAGNOSIS — I82411 Acute embolism and thrombosis of right femoral vein: Secondary | ICD-10-CM | POA: Diagnosis present

## 2021-04-19 DIAGNOSIS — I10 Essential (primary) hypertension: Secondary | ICD-10-CM | POA: Diagnosis not present

## 2021-04-19 DIAGNOSIS — R54 Age-related physical debility: Secondary | ICD-10-CM | POA: Diagnosis present

## 2021-04-19 DIAGNOSIS — E1122 Type 2 diabetes mellitus with diabetic chronic kidney disease: Secondary | ICD-10-CM | POA: Diagnosis present

## 2021-04-19 DIAGNOSIS — Z741 Need for assistance with personal care: Secondary | ICD-10-CM | POA: Diagnosis not present

## 2021-04-19 DIAGNOSIS — Z634 Disappearance and death of family member: Secondary | ICD-10-CM

## 2021-04-19 DIAGNOSIS — N390 Urinary tract infection, site not specified: Secondary | ICD-10-CM | POA: Diagnosis not present

## 2021-04-19 DIAGNOSIS — E860 Dehydration: Secondary | ICD-10-CM | POA: Diagnosis present

## 2021-04-19 DIAGNOSIS — J9811 Atelectasis: Secondary | ICD-10-CM | POA: Diagnosis not present

## 2021-04-19 DIAGNOSIS — R4182 Altered mental status, unspecified: Secondary | ICD-10-CM | POA: Diagnosis not present

## 2021-04-19 DIAGNOSIS — R627 Adult failure to thrive: Secondary | ICD-10-CM | POA: Diagnosis not present

## 2021-04-19 DIAGNOSIS — F32A Depression, unspecified: Secondary | ICD-10-CM | POA: Diagnosis not present

## 2021-04-19 DIAGNOSIS — I63411 Cerebral infarction due to embolism of right middle cerebral artery: Secondary | ICD-10-CM | POA: Diagnosis not present

## 2021-04-19 DIAGNOSIS — I63413 Cerebral infarction due to embolism of bilateral middle cerebral arteries: Secondary | ICD-10-CM | POA: Diagnosis present

## 2021-04-19 DIAGNOSIS — E872 Acidosis, unspecified: Secondary | ICD-10-CM | POA: Diagnosis present

## 2021-04-19 DIAGNOSIS — M8580 Other specified disorders of bone density and structure, unspecified site: Secondary | ICD-10-CM | POA: Diagnosis present

## 2021-04-19 DIAGNOSIS — R531 Weakness: Secondary | ICD-10-CM | POA: Diagnosis not present

## 2021-04-19 DIAGNOSIS — Z515 Encounter for palliative care: Secondary | ICD-10-CM | POA: Diagnosis not present

## 2021-04-19 DIAGNOSIS — E049 Nontoxic goiter, unspecified: Secondary | ICD-10-CM | POA: Diagnosis present

## 2021-04-19 DIAGNOSIS — E785 Hyperlipidemia, unspecified: Secondary | ICD-10-CM | POA: Diagnosis present

## 2021-04-19 DIAGNOSIS — E1129 Type 2 diabetes mellitus with other diabetic kidney complication: Secondary | ICD-10-CM | POA: Diagnosis present

## 2021-04-19 DIAGNOSIS — E119 Type 2 diabetes mellitus without complications: Secondary | ICD-10-CM | POA: Diagnosis not present

## 2021-04-19 DIAGNOSIS — I5032 Chronic diastolic (congestive) heart failure: Secondary | ICD-10-CM | POA: Diagnosis present

## 2021-04-19 DIAGNOSIS — K5900 Constipation, unspecified: Secondary | ICD-10-CM | POA: Diagnosis not present

## 2021-04-19 DIAGNOSIS — I69828 Other speech and language deficits following other cerebrovascular disease: Secondary | ICD-10-CM | POA: Diagnosis not present

## 2021-04-19 DIAGNOSIS — R4020 Unspecified coma: Secondary | ICD-10-CM | POA: Diagnosis not present

## 2021-04-19 DIAGNOSIS — G9341 Metabolic encephalopathy: Secondary | ICD-10-CM | POA: Diagnosis not present

## 2021-04-19 DIAGNOSIS — I639 Cerebral infarction, unspecified: Secondary | ICD-10-CM | POA: Diagnosis not present

## 2021-04-19 DIAGNOSIS — F840 Autistic disorder: Secondary | ICD-10-CM | POA: Diagnosis present

## 2021-04-19 DIAGNOSIS — Z880 Allergy status to penicillin: Secondary | ICD-10-CM

## 2021-04-19 DIAGNOSIS — F0390 Unspecified dementia without behavioral disturbance: Secondary | ICD-10-CM | POA: Diagnosis not present

## 2021-04-19 DIAGNOSIS — I471 Supraventricular tachycardia: Secondary | ICD-10-CM | POA: Diagnosis present

## 2021-04-19 DIAGNOSIS — Z20822 Contact with and (suspected) exposure to covid-19: Secondary | ICD-10-CM | POA: Diagnosis present

## 2021-04-19 DIAGNOSIS — R0902 Hypoxemia: Secondary | ICD-10-CM | POA: Diagnosis present

## 2021-04-19 DIAGNOSIS — I69322 Dysarthria following cerebral infarction: Secondary | ICD-10-CM | POA: Diagnosis not present

## 2021-04-19 DIAGNOSIS — S92301D Fracture of unspecified metatarsal bone(s), right foot, subsequent encounter for fracture with routine healing: Secondary | ICD-10-CM | POA: Diagnosis not present

## 2021-04-19 DIAGNOSIS — R29703 NIHSS score 3: Secondary | ICD-10-CM | POA: Diagnosis present

## 2021-04-19 DIAGNOSIS — I6602 Occlusion and stenosis of left middle cerebral artery: Secondary | ICD-10-CM | POA: Diagnosis not present

## 2021-04-19 DIAGNOSIS — H9190 Unspecified hearing loss, unspecified ear: Secondary | ICD-10-CM | POA: Diagnosis present

## 2021-04-19 DIAGNOSIS — Z8571 Personal history of Hodgkin lymphoma: Secondary | ICD-10-CM

## 2021-04-19 DIAGNOSIS — E1140 Type 2 diabetes mellitus with diabetic neuropathy, unspecified: Secondary | ICD-10-CM | POA: Diagnosis not present

## 2021-04-19 DIAGNOSIS — J189 Pneumonia, unspecified organism: Secondary | ICD-10-CM | POA: Diagnosis present

## 2021-04-19 DIAGNOSIS — E43 Unspecified severe protein-calorie malnutrition: Secondary | ICD-10-CM | POA: Diagnosis present

## 2021-04-19 DIAGNOSIS — N1831 Chronic kidney disease, stage 3a: Secondary | ICD-10-CM | POA: Diagnosis present

## 2021-04-19 DIAGNOSIS — R0689 Other abnormalities of breathing: Secondary | ICD-10-CM | POA: Diagnosis not present

## 2021-04-19 DIAGNOSIS — I63441 Cerebral infarction due to embolism of right cerebellar artery: Secondary | ICD-10-CM | POA: Diagnosis present

## 2021-04-19 DIAGNOSIS — Z1159 Encounter for screening for other viral diseases: Secondary | ICD-10-CM | POA: Diagnosis not present

## 2021-04-19 DIAGNOSIS — R42 Dizziness and giddiness: Secondary | ICD-10-CM | POA: Diagnosis not present

## 2021-04-19 DIAGNOSIS — N179 Acute kidney failure, unspecified: Secondary | ICD-10-CM | POA: Diagnosis present

## 2021-04-19 DIAGNOSIS — I6389 Other cerebral infarction: Secondary | ICD-10-CM | POA: Diagnosis not present

## 2021-04-19 DIAGNOSIS — N189 Chronic kidney disease, unspecified: Secondary | ICD-10-CM | POA: Diagnosis not present

## 2021-04-19 DIAGNOSIS — K219 Gastro-esophageal reflux disease without esophagitis: Secondary | ICD-10-CM | POA: Diagnosis present

## 2021-04-19 DIAGNOSIS — Z66 Do not resuscitate: Secondary | ICD-10-CM | POA: Diagnosis present

## 2021-04-19 DIAGNOSIS — Z6821 Body mass index (BMI) 21.0-21.9, adult: Secondary | ICD-10-CM

## 2021-04-19 DIAGNOSIS — I82443 Acute embolism and thrombosis of tibial vein, bilateral: Secondary | ICD-10-CM | POA: Diagnosis present

## 2021-04-19 DIAGNOSIS — J90 Pleural effusion, not elsewhere classified: Secondary | ICD-10-CM | POA: Diagnosis present

## 2021-04-19 DIAGNOSIS — E559 Vitamin D deficiency, unspecified: Secondary | ICD-10-CM | POA: Diagnosis not present

## 2021-04-19 DIAGNOSIS — Z79899 Other long term (current) drug therapy: Secondary | ICD-10-CM

## 2021-04-19 DIAGNOSIS — G936 Cerebral edema: Secondary | ICD-10-CM | POA: Diagnosis present

## 2021-04-19 DIAGNOSIS — Z881 Allergy status to other antibiotic agents status: Secondary | ICD-10-CM

## 2021-04-19 DIAGNOSIS — S92351A Displaced fracture of fifth metatarsal bone, right foot, initial encounter for closed fracture: Secondary | ICD-10-CM | POA: Diagnosis present

## 2021-04-19 DIAGNOSIS — R471 Dysarthria and anarthria: Secondary | ICD-10-CM | POA: Diagnosis present

## 2021-04-19 DIAGNOSIS — I82403 Acute embolism and thrombosis of unspecified deep veins of lower extremity, bilateral: Secondary | ICD-10-CM | POA: Diagnosis present

## 2021-04-19 DIAGNOSIS — S92301A Fracture of unspecified metatarsal bone(s), right foot, initial encounter for closed fracture: Secondary | ICD-10-CM | POA: Diagnosis present

## 2021-04-19 DIAGNOSIS — E1151 Type 2 diabetes mellitus with diabetic peripheral angiopathy without gangrene: Secondary | ICD-10-CM | POA: Diagnosis present

## 2021-04-19 DIAGNOSIS — R55 Syncope and collapse: Secondary | ICD-10-CM | POA: Diagnosis present

## 2021-04-19 DIAGNOSIS — Z794 Long term (current) use of insulin: Secondary | ICD-10-CM

## 2021-04-19 DIAGNOSIS — R509 Fever, unspecified: Secondary | ICD-10-CM | POA: Diagnosis not present

## 2021-04-19 DIAGNOSIS — D509 Iron deficiency anemia, unspecified: Secondary | ICD-10-CM | POA: Diagnosis not present

## 2021-04-19 DIAGNOSIS — I517 Cardiomegaly: Secondary | ICD-10-CM | POA: Diagnosis not present

## 2021-04-19 DIAGNOSIS — G319 Degenerative disease of nervous system, unspecified: Secondary | ICD-10-CM | POA: Diagnosis not present

## 2021-04-19 DIAGNOSIS — E440 Moderate protein-calorie malnutrition: Secondary | ICD-10-CM | POA: Diagnosis present

## 2021-04-19 DIAGNOSIS — I69811 Memory deficit following other cerebrovascular disease: Secondary | ICD-10-CM | POA: Diagnosis not present

## 2021-04-19 DIAGNOSIS — E7800 Pure hypercholesterolemia, unspecified: Secondary | ICD-10-CM | POA: Diagnosis not present

## 2021-04-19 DIAGNOSIS — N3281 Overactive bladder: Secondary | ICD-10-CM | POA: Diagnosis not present

## 2021-04-19 DIAGNOSIS — I63311 Cerebral infarction due to thrombosis of right middle cerebral artery: Secondary | ICD-10-CM | POA: Diagnosis not present

## 2021-04-19 DIAGNOSIS — Z8249 Family history of ischemic heart disease and other diseases of the circulatory system: Secondary | ICD-10-CM

## 2021-04-19 DIAGNOSIS — Z7189 Other specified counseling: Secondary | ICD-10-CM | POA: Diagnosis not present

## 2021-04-19 DIAGNOSIS — Z7401 Bed confinement status: Secondary | ICD-10-CM | POA: Diagnosis not present

## 2021-04-19 DIAGNOSIS — R4189 Other symptoms and signs involving cognitive functions and awareness: Secondary | ICD-10-CM | POA: Diagnosis not present

## 2021-04-19 DIAGNOSIS — M6281 Muscle weakness (generalized): Secondary | ICD-10-CM | POA: Diagnosis not present

## 2021-04-19 DIAGNOSIS — D649 Anemia, unspecified: Secondary | ICD-10-CM | POA: Diagnosis not present

## 2021-04-19 DIAGNOSIS — E1121 Type 2 diabetes mellitus with diabetic nephropathy: Secondary | ICD-10-CM | POA: Diagnosis not present

## 2021-04-19 DIAGNOSIS — I82413 Acute embolism and thrombosis of femoral vein, bilateral: Secondary | ICD-10-CM | POA: Diagnosis not present

## 2021-04-19 LAB — URINALYSIS, ROUTINE W REFLEX MICROSCOPIC
Bilirubin Urine: NEGATIVE
Glucose, UA: NEGATIVE mg/dL
Hgb urine dipstick: NEGATIVE
Ketones, ur: 5 mg/dL — AB
Nitrite: NEGATIVE
Protein, ur: 100 mg/dL — AB
Specific Gravity, Urine: 1.025 (ref 1.005–1.030)
pH: 5 (ref 5.0–8.0)

## 2021-04-19 LAB — COMPREHENSIVE METABOLIC PANEL
ALT: 25 U/L (ref 0–44)
AST: 26 U/L (ref 15–41)
Albumin: 3 g/dL — ABNORMAL LOW (ref 3.5–5.0)
Alkaline Phosphatase: 109 U/L (ref 38–126)
Anion gap: 14 (ref 5–15)
BUN: 29 mg/dL — ABNORMAL HIGH (ref 8–23)
CO2: 18 mmol/L — ABNORMAL LOW (ref 22–32)
Calcium: 8.5 mg/dL — ABNORMAL LOW (ref 8.9–10.3)
Chloride: 105 mmol/L (ref 98–111)
Creatinine, Ser: 1.33 mg/dL — ABNORMAL HIGH (ref 0.44–1.00)
GFR, Estimated: 38 mL/min — ABNORMAL LOW (ref >60)
Glucose, Bld: 145 mg/dL — ABNORMAL HIGH (ref 70–99)
Potassium: 4.6 mmol/L (ref 3.5–5.1)
Sodium: 137 mmol/L (ref 135–145)
Total Bilirubin: 0.4 mg/dL (ref 0.3–1.2)
Total Protein: 6.7 g/dL (ref 6.5–8.1)

## 2021-04-19 LAB — CBC WITH DIFFERENTIAL/PLATELET
Abs Immature Granulocytes: 0.29 10*3/uL — ABNORMAL HIGH (ref 0.00–0.07)
Basophils Absolute: 0.1 10*3/uL (ref 0.0–0.1)
Basophils Relative: 1 %
Eosinophils Absolute: 0 10*3/uL (ref 0.0–0.5)
Eosinophils Relative: 0 %
HCT: 33.2 % — ABNORMAL LOW (ref 36.0–46.0)
Hemoglobin: 10.2 g/dL — ABNORMAL LOW (ref 12.0–15.0)
Immature Granulocytes: 3 %
Lymphocytes Relative: 8 %
Lymphs Abs: 0.9 10*3/uL (ref 0.7–4.0)
MCH: 27.6 pg (ref 26.0–34.0)
MCHC: 30.7 g/dL (ref 30.0–36.0)
MCV: 90 fL (ref 80.0–100.0)
Monocytes Absolute: 0.4 10*3/uL (ref 0.1–1.0)
Monocytes Relative: 4 %
Neutro Abs: 9.6 10*3/uL — ABNORMAL HIGH (ref 1.7–7.7)
Neutrophils Relative %: 84 %
Platelets: 344 10*3/uL (ref 150–400)
RBC: 3.69 MIL/uL — ABNORMAL LOW (ref 3.87–5.11)
RDW: 16.2 % — ABNORMAL HIGH (ref 11.5–15.5)
WBC: 11.3 10*3/uL — ABNORMAL HIGH (ref 4.0–10.5)
nRBC: 0 % (ref 0.0–0.2)

## 2021-04-19 LAB — URINALYSIS, MICROSCOPIC (REFLEX): Squamous Epithelial / HPF: NONE SEEN (ref 0–5)

## 2021-04-19 LAB — MRSA NEXT GEN BY PCR, NASAL: MRSA by PCR Next Gen: NOT DETECTED

## 2021-04-19 LAB — PROTIME-INR
INR: 1.1 (ref 0.8–1.2)
Prothrombin Time: 14.3 seconds (ref 11.4–15.2)

## 2021-04-19 LAB — I-STAT CHEM 8, ED
BUN: 27 mg/dL — ABNORMAL HIGH (ref 8–23)
Calcium, Ion: 1.16 mmol/L (ref 1.15–1.40)
Chloride: 106 mmol/L (ref 98–111)
Creatinine, Ser: 1.3 mg/dL — ABNORMAL HIGH (ref 0.44–1.00)
Glucose, Bld: 142 mg/dL — ABNORMAL HIGH (ref 70–99)
HCT: 32 % — ABNORMAL LOW (ref 36.0–46.0)
Hemoglobin: 10.9 g/dL — ABNORMAL LOW (ref 12.0–15.0)
Potassium: 4.6 mmol/L (ref 3.5–5.1)
Sodium: 136 mmol/L (ref 135–145)
TCO2: 19 mmol/L — ABNORMAL LOW (ref 22–32)

## 2021-04-19 LAB — LACTIC ACID, PLASMA
Lactic Acid, Venous: 5 mmol/L (ref 0.5–1.9)
Lactic Acid, Venous: 4.2 mmol/L (ref 0.5–1.9)
Lactic Acid, Venous: 2.4 mmol/L (ref 0.5–1.9)
Lactic Acid, Venous: 1.4 mmol/L (ref 0.5–1.9)

## 2021-04-19 LAB — RESP PANEL BY RT-PCR (FLU A&B, COVID) ARPGX2
Influenza A by PCR: NEGATIVE
Influenza B by PCR: NEGATIVE
SARS Coronavirus 2 by RT PCR: NEGATIVE

## 2021-04-19 LAB — CBG MONITORING, ED: Glucose-Capillary: 128 mg/dL — ABNORMAL HIGH (ref 70–99)

## 2021-04-19 LAB — APTT: aPTT: 23 seconds — ABNORMAL LOW (ref 24–36)

## 2021-04-19 MED ORDER — LACTATED RINGERS IV BOLUS (SEPSIS)
1000.0000 mL | Freq: Once | INTRAVENOUS | Status: AC
  Administered 2021-04-19: 1000 mL via INTRAVENOUS

## 2021-04-19 MED ORDER — VANCOMYCIN HCL IN DEXTROSE 1-5 GM/200ML-% IV SOLN: 1000.0000 mg | INTRAVENOUS | Status: DC

## 2021-04-19 MED ORDER — STROKE: EARLY STAGES OF RECOVERY BOOK
Freq: Once | Status: AC
  Filled 2021-04-19 (×2): qty 1

## 2021-04-19 MED ORDER — ASPIRIN 81 MG PO CHEW
81.0000 mg | CHEWABLE_TABLET | Freq: Every day | ORAL | Status: DC
  Administered 2021-04-20: 10:00:00 81 mg via ORAL
  Filled 2021-04-19: qty 1

## 2021-04-19 MED ORDER — SODIUM CHLORIDE 0.9 % IV SOLN
2.0000 g | Freq: Two times a day (BID) | INTRAVENOUS | Status: DC
  Administered 2021-04-19: 2 g via INTRAVENOUS
  Filled 2021-04-19: qty 2

## 2021-04-19 MED ORDER — VANCOMYCIN HCL 1250 MG/250ML IV SOLN
1250.0000 mg | Freq: Once | INTRAVENOUS | Status: AC
  Administered 2021-04-19: 1250 mg via INTRAVENOUS
  Filled 2021-04-19: qty 250

## 2021-04-19 MED ORDER — SODIUM CHLORIDE 0.9 % IV SOLN
2.0000 g | Freq: Once | INTRAVENOUS | Status: AC
  Administered 2021-04-19: 2 g via INTRAVENOUS
  Filled 2021-04-19: qty 20

## 2021-04-19 MED ORDER — INSULIN ASPART 100 UNIT/ML IJ SOLN
0.0000 [IU] | Freq: Three times a day (TID) | INTRAMUSCULAR | Status: DC
  Filled 2021-04-19: qty 0.09

## 2021-04-19 MED ORDER — SODIUM CHLORIDE 0.9 % IV SOLN: INTRAVENOUS | Status: DC

## 2021-04-19 MED ORDER — LORAZEPAM 2 MG/ML IJ SOLN
INTRAMUSCULAR | Status: AC
  Filled 2021-04-19: qty 1

## 2021-04-19 MED ORDER — AZITHROMYCIN 500 MG IV SOLR
500.0000 mg | Freq: Once | INTRAVENOUS | Status: AC
  Administered 2021-04-19: 500 mg via INTRAVENOUS
  Filled 2021-04-19: qty 5

## 2021-04-19 MED ORDER — LORAZEPAM 2 MG/ML IJ SOLN
0.5000 mg | Freq: Once | INTRAMUSCULAR | Status: AC
  Administered 2021-04-19: 0.5 mg via INTRAVENOUS

## 2021-04-19 MED ORDER — OXYBUTYNIN CHLORIDE ER 5 MG PO TB24
5.0000 mg | ORAL_TABLET | Freq: Two times a day (BID) | ORAL | Status: DC
Start: 2021-04-19 — End: 2021-04-27
  Administered 2021-04-21 – 2021-04-27 (×13): 5 mg via ORAL
  Filled 2021-04-19 (×13): qty 1

## 2021-04-19 NOTE — Progress Notes (Signed)
Notified bedside nurse of need to draw and administer repeat lactic acid and fluid bolus.

## 2021-04-19 NOTE — H&P (Signed)
History and Physical    Lisa Villegas CHE:[id number] DOB: 1930-07-02 DOA: 04/19/2021  PCP: [name], MD  Patient coming from: ALF  Chief Complaint: syncope  HPI: Lisa Villegas is a 88 y.o. female with medical history significant of chronic diastolic HF, NTI1W, DM2, HTN, HLD. Presenting with syncope. History is from chart review as patient is confused at this time. Apparently the patient was at her ALF this morning and had a syncopal event. She acutely felt weak, fell into staffs arms and was lowered to the ground. She did not hit her head. When she came to, she was confused. EMS was called for help.   ED Course: CTH showed new acute/subacute appearing right temporoparietal infarct. Neurology was consulted. They recommended MRI and transfer to [facility].   Review of Systems:  Unable to obtain d/t mentation.   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[place])    Autism    Chronic CHF (congestive heart failure) ([place])    per med rec   Chronic kidney disease (CKD)    stage 3 per chart in 09/2015   Diabetes mellitus without complication (HCC)    Diabetic neuropathy (HCC)    History of thyroid nodule    nontoxic goiter per med rec   Hodgkin disease ([place])    in 1980   HTN (hypertension), benign    Hyperlipidemia    Incontinence    Lack of sensation    Osteopenia    per med rec   Poor balance    Stroke ([place])    Tubular adenoma of colon 01/2017   Vitamin D deficiency     PSHx Past Surgical History:  Procedure Laterality Date   ABDOMINAL HYSTERECTOMY     BREAST LUMPECTOMY Left     SocHx Unable to obtain d/t mentation.  Allergies  Allergen Reactions   Ciprofloxacin Rash    Localized red rash to IV site   Penicillins Rash    On MAR: Tolerates multiple cephalosporins, including cephalexin.    FamHx Family History  Problem Relation Age of Onset   CAD Mother        died at 36 yo    Atrial fibrillation Mother        and tachycardia   CAD Father        died at 34 yo   Atrial fibrillation Father        and tachycardia    Prior to Admission medications   Medication Sig Start Date End Date Taking? Authorizing Provider  acetaminophen (TYLENOL) 325 MG tablet Take 2 tablets (650 mg total) by mouth every 6 (six) hours as needed for mild pain (or Fever >/= 101). 04/02/21   Eugenie Filler, MD  aspirin 81 MG tablet Take 1 tablet (81 mg total) by mouth daily. 07/26/17   Patrecia Pour, MD  blood glucose meter kit and supplies Dispense based on patient and insurance preference. Use up to four times daily as directed. (FOR ICD-10 E10.9, E11.9). 12/06/19   Dessa Phi, DO  Fe Fum-FePoly-Vit C-Vit B3 (INTEGRA) 62.5-62.5-40-3 MG CAPS Take 1 capsule by mouth daily.    [provider]  ferrous sulfate 325 (65 FE) MG tablet Take 325 mg by mouth daily.     [provider]  hydrALAZINE (APRESOLINE) 25 MG tablet Take 2 tablets (50 mg total) by mouth 3 (three) times daily. 04/08/21   Florene Glen,  A Clint Lipps., MD  insulin glargine (LANTUS SOLOSTAR) 100 UNIT/ML Solostar Pen Inject 12 Units into the skin at bedtime. Patient taking differently: Inject 15 Units into the skin at bedtime. 12/06/19   Dessa Phi, DO  metFORMIN (GLUCOPHAGE) 500 MG tablet Take 1 tablet (500 mg total) by mouth See admin instructions. Take 1066m in the morning and 5056mat night. Patient taking differently: Take 1,000 mg by mouth in the morning and at bedtime. 12/01/19   ScGareth MorganMD  metoprolol tartrate (LOPRESSOR) 25 MG tablet Take 0.5 tablets (12.5 mg total) by mouth 2 (two) times daily. 04/08/21 05/08/21  PoElodia Florence MD  omeprazole (PRILOSEC) 40 MG capsule Take 40 mg by mouth daily.    [provider]  oxybutynin (DITROPAN) 5 MG tablet Take 5 mg by mouth 2 (two) times daily. 08/25/20   [provider]  oxyCODONE (OXY IR/ROXICODONE) 5 MG immediate release tablet Take  0.5 tablets (2.5 mg total) by mouth every 4 (four) hours as needed for moderate pain. 04/02/21   ThEugenie FillerMD  senna-docusate (SENOKOT S) 8.6-50 MG tablet Take 1 tablet by mouth 2 (two) times daily. 04/02/21   ThEugenie FillerMD  Sodium Fluoride (PREVIDENT 5000 BOOSTER PLUS DT) Place 1 application onto teeth every evening.     [provider]  Stannous Fluoride (SENSODYNE COMPLETE PROTECTION MT) Use as directed in the mouth or throat. Mint,place one application onto teeth daily as directed    [provider]  verapamil (CALAN-SR) 120 MG CR tablet Take 120 mg by mouth at bedtime.    [provider]    Physical Exam: Vitals:   04/19/21 1415 04/19/21 1430 04/19/21 1445 04/19/21 1500  BP: (!) 165/76 (!) 173/81 (!) 174/88 (!) 196/162  Pulse: 81 97 89 89  Resp: (!) _0 (!) 25  Temp:      TempSrc:      SpO2: 96% 98% 99% 98%    General: [age].o. female resting in bed in NAD Eyes: PERRL, normal sclera ENMT: Nares patent w/o discharge, orophaynx clear, dentition normal, ears w/o discharge/lesions/ulcers Neck: Supple, trachea midline Cardiovascular: RRR, +S1, S2, no m/g/r, equal pulses throughout Respiratory: CTABL, no w/r/r, normal WOB GI: BS+, NDNT, no masses noted, no organomegaly noted MSK: No e/c/c; right leg in boot Neuro: A&O to name, but otherwise not participating in exam  Labs on Admission: I have personally reviewed following labs and imaging studies  CBC: Recent Labs  Lab 04/19/21 1314 04/19/21 1321  WBC 11.3*  --   NEUTROABS 9.6*  --   HGB 10.2* 10.9*  HCT 33.2* 32.0*  MCV 90.0  --   PLT 344  --    Basic Metabolic Panel: Recent Labs  Lab 04/19/21 1314 04/19/21 1321  NA 137 136  K 4.6 4.6  CL 105 106  CO2 18*  --   GLUCOSE 145* 142*  BUN 29* 27*  CREATININE 1.33* 1.30*  CALCIUM 8.5*  --    GFR: CrCl cannot be calculated (Unknown ideal weight.). Liver Function Tests: Recent Labs  Lab 04/19/21 1314  AST 26  ALT 25   ALKPHOS 109  BILITOT 0.4  PROT 6.7  ALBUMIN 3.0*   No results for input(s): LIPASE, AMYLASE in the last 168 hours. No results for input(s): AMMONIA in the last 168 hours. Coagulation Profile: Recent Labs  Lab 04/19/21 1311  INR 1.1   Cardiac Enzymes: No results for input(s): CKTOTAL, CKMB, CKMBINDEX, TROPONINI in the last  168 hours. BNP (last 3 results) No results for input(s): PROBNP in the last 8760 hours. HbA1C: No results for input(s): HGBA1C in the last 72 hours. CBG: No results for input(s): GLUCAP in the last 168 hours. Lipid Profile: No results for input(s): CHOL, HDL, LDLCALC, TRIG, CHOLHDL, LDLDIRECT in the last 72 hours. Thyroid Function Tests: No results for input(s): TSH, T4TOTAL, FREET4, T3FREE, THYROIDAB in the last 72 hours. Anemia Panel: No results for input(s): VITAMINB12, FOLATE, FERRITIN, TIBC, IRON, RETICCTPCT in the last 72 hours. Urine analysis:    Component Value Date/Time   COLORURINE YELLOW 03/30/2021 1554   APPEARANCEUR CLOUDY (A) 03/30/2021 1554   APPEARANCEUR Clear 10/11/2017 1310   LABSPEC 1.015 03/30/2021 1554   PHURINE 8.0 03/30/2021 1554   GLUCOSEU NEGATIVE 03/30/2021 1554   HGBUR SMALL (A) 03/30/2021 1554   BILIRUBINUR NEGATIVE 03/30/2021 1554   BILIRUBINUR Negative 10/11/2017 1310   KETONESUR 20 (A) 03/30/2021 1554   PROTEINUR >=300 (A) 03/30/2021 1554   UROBILINOGEN negative 03/14/2016 1227   NITRITE NEGATIVE 03/30/2021 1554   LEUKOCYTESUR LARGE (A) 03/30/2021 1554    Radiological Exams on Admission: CT Head Wo Contrast  Result Date: 04/19/2021 CLINICAL DATA:  Altered mental status EXAM: CT HEAD WITHOUT CONTRAST TECHNIQUE: Contiguous axial images were obtained from the base of the skull through the vertex without intravenous contrast. RADIATION DOSE REDUCTION: This exam was performed according to the departmental dose-optimization program which includes automated exposure control, adjustment of the mA and/or kV according to  patient size and/or use of iterative reconstruction technique. 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 New no evidence of acute hemorrhage, hydrocephalus, extra-axial collection or mass lesion/mass effect. Vascular: No hyperdense vessel or unexpected calcification. Skull: Normal. Negative for fracture or focal lesion. Sinuses/Orbits: Air-fluid level seen in the bilateral sphenoid sinuses. Other: None. IMPRESSION: 1. New acute/subacute appearing right temporoparietal infarct. 2. No evidence of intracranial hemorrhage. Critical Value/emergent results were called by telephone at the time of interpretation on 04/19/2021 at 12:47 pm to provider DAN FLOYD , who verbally acknowledged these results. Electronically Signed   By: Yetta Glassman M.D.   On: 04/19/2021 12:51   DG Chest Port 1 View  Result Date: 04/19/2021 CLINICAL DATA:  Syncopal episode.  Question sepsis. EXAM: PORTABLE CHEST 1 VIEW COMPARISON:  03/30/2021 FINDINGS: Worsened appearance since the study of 20 days ago. Borderline cardiomegaly and aortic atherosclerosis are again noted. There are developing bilateral pleural effusions which are moderate in size and associated with bilateral lower lobe volume loss. Lower lobe pneumonia not excluded. Upper lungs remain clear. IMPRESSION: Development of moderate bilateral pleural effusions with lower lobe atelectasis. Pneumonia not excluded. Electronically Signed   By: Nelson Chimes M.D.   On: 04/19/2021 12:48    EKG: Independently reviewed. Sinus, no st elevations  Assessment/Plan CVA     - admit to inpt, tele @ [facility]     - neurology onboard, appreciate assistance     - permissive HTN for now     - MRI brain ordered     - check A1c, lipid panel     - PT/OT/TOC     - echo  Sepsis secondary to unknown source     - most likely source is lung as she had b/l pleural effusion     - will cover her for HCAP since she was just  in the hospital     - check procal     -  check RVP, urine legionella, urine strep     - check Bld Cx, UA/Ucx     - follow lactic acid  DM2     - SSI, A1c, glucose checks, DM diet when off NPO status  CKD3a     - at baseline, watch nephrotoxins  Chronic diastolic HF     - needs fluids for sepsis; give gently, follow I&O  HTN     - permissive HTN for now  HLD     - continue statin  DVT prophylaxis: SCDs  Code Status: DNR  Family Communication: None at bedside.   Consults called: EDP spoke with neurology   Status is: Inpatient  Remains inpatient appropriate because: severity of illness  Kanav Kazmierczak A Deloise Marchant DO [facility]  If 7PM-7AM, please contact night-coverage [email or web address]  04/19/2021, 3:28 PM

## 2021-04-19 NOTE — ED Triage Notes (Signed)
Pt here from assisted living after having a syncopal episode in a wheelchair , pt is at baseline mentally according to ems, no fever

## 2021-04-19 NOTE — ED Notes (Signed)
Patients POA : Lisa Villegas [phone] Would like an update on the patient they are in the chart and caregiver

## 2021-04-19 NOTE — ED Provider Notes (Signed)
[facility] DEPT Provider Note   CSN: [id number] Arrival date & time: 04/19/21  1142     History  No chief complaint on file.   Lisa Villegas is a 88 y.o. female.  88 yo F with a cc of what sounds like a near syncopal event.  Patient was ambulating and suddenly felt unwell and then had to lay down on the floor.  EMS was then called.  Patient not providing much history level 5 caveat.    Per EMS there was no obvious injury.  Patient was just in the hospital for multiple metatarsal fractures on the right.  The history is provided by the patient.  Illness Severity:  Moderate Onset quality:  Gradual Duration:  2 days Timing:  Constant Progression:  Unchanged Chronicity:  New Associated symptoms: no chest pain, no congestion, no fever, no headaches, no myalgias, no nausea, no rhinorrhea, no shortness of breath, no vomiting and no wheezing       Home Medications Prior to Admission medications   Medication Sig Start Date End Date Taking? Authorizing Provider  acetaminophen (TYLENOL) 325 MG tablet Take 2 tablets (650 mg total) by mouth every 6 (six) hours as needed for mild pain (or Fever >/= 101). 04/02/21   Eugenie Filler, MD  aspirin 81 MG tablet Take 1 tablet (81 mg total) by mouth daily. 07/26/17   Patrecia Pour, MD  blood glucose meter kit and supplies Dispense based on patient and insurance preference. Use up to four times daily as directed. (FOR ICD-10 E10.9, E11.9). 12/06/19   Dessa Phi, DO  Fe Fum-FePoly-Vit C-Vit B3 (INTEGRA) 62.5-62.5-40-3 MG CAPS Take 1 capsule by mouth daily.    [provider]  ferrous sulfate 325 (65 FE) MG tablet Take 325 mg by mouth daily.     [provider]  hydrALAZINE (APRESOLINE) 25 MG tablet Take 2 tablets (50 mg total) by mouth 3 (three) times daily. 04/08/21   Elodia Florence., MD  insulin glargine (LANTUS SOLOSTAR) 100 UNIT/ML Solostar Pen Inject 12 Units into the skin at  bedtime. Patient taking differently: Inject 15 Units into the skin at bedtime. 12/06/19   Dessa Phi, DO  metFORMIN (GLUCOPHAGE) 500 MG tablet Take 1 tablet (500 mg total) by mouth See admin instructions. Take 1010m in the morning and 5014mat night. Patient taking differently: Take 1,000 mg by mouth in the morning and at bedtime. 12/01/19   ScGareth MorganMD  metoprolol tartrate (LOPRESSOR) 25 MG tablet Take 0.5 tablets (12.5 mg total) by mouth 2 (two) times daily. 04/08/21 05/08/21  PoElodia Florence MD  omeprazole (PRILOSEC) 40 MG capsule Take 40 mg by mouth daily.    [provider]  oxybutynin (DITROPAN) 5 MG tablet Take 5 mg by mouth 2 (two) times daily. 08/25/20   [provider]  oxyCODONE (OXY IR/ROXICODONE) 5 MG immediate release tablet Take 0.5 tablets (2.5 mg total) by mouth every 4 (four) hours as needed for moderate pain. 04/02/21   ThEugenie FillerMD  senna-docusate (SENOKOT S) 8.6-50 MG tablet Take 1 tablet by mouth 2 (two) times daily. 04/02/21   ThEugenie FillerMD  Sodium Fluoride (PREVIDENT 5000 BOOSTER PLUS DT) Place 1 application onto teeth every evening.     [provider]  Stannous Fluoride (SENSODYNE COMPLETE PROTECTION MT) Use as directed in the mouth or throat. Mint,place one application onto teeth daily as directed    [provider]  verapamil (  CALAN-SR) 120 MG CR tablet Take 120 mg by mouth at bedtime.    [provider]      Allergies    Ciprofloxacin and Penicillins    Review of Systems   Review of Systems  Unable to perform ROS: Dementia  Constitutional:  Negative for chills and fever.  HENT:  Negative for congestion and rhinorrhea.   Eyes:  Negative for redness and visual disturbance.  Respiratory:  Negative for shortness of breath and wheezing.   Cardiovascular:  Negative for chest pain and palpitations.  Gastrointestinal:  Negative for nausea and vomiting.  Genitourinary:  Negative for dysuria and  urgency.  Musculoskeletal:  Negative for arthralgias and myalgias.  Skin:  Negative for pallor and wound.  Neurological:  Negative for dizziness and headaches.   Physical Exam Updated Vital Signs BP (!) 196/162    Pulse 89    Temp (!) 96.1 F (35.6 C) (Rectal)    Resp (!) 25    SpO2 98%  Physical Exam Vitals and nursing note reviewed.  Constitutional:      General: She is not in acute distress.    Appearance: She is well-developed. She is not diaphoretic.     Comments: Pale, chronically ill appearing  HENT:     Head: Normocephalic and atraumatic.  Eyes:     Pupils: Pupils are equal, round, and reactive to light.  Cardiovascular:     Rate and Rhythm: Normal rate and regular rhythm.     Heart sounds: No murmur heard.   No friction rub. No gallop.  Pulmonary:     Effort: Pulmonary effort is normal.     Breath sounds: No wheezing or rales.  Abdominal:     General: There is no distension.     Palpations: Abdomen is soft.     Tenderness: There is no abdominal tenderness.  Musculoskeletal:        General: No tenderness.     Cervical back: Normal range of motion and neck supple.     Comments: R foot in a boot  Skin:    General: Skin is warm and dry.     Comments: Slightly warm to touch  Neurological:     Mental Status: She is alert and oriented to person, place, and time.  Psychiatric:        Behavior: Behavior normal.    ED Results / Procedures / Treatments   Labs (all labs ordered are listed, but only abnormal results are displayed) Labs Reviewed  LACTIC ACID, PLASMA - Abnormal; Notable for the following components:      Result Value   Lactic Acid, Venous 5.0 (*)    All other components within normal limits  COMPREHENSIVE METABOLIC PANEL - Abnormal; Notable for the following components:   CO2 18 (*)    Glucose, Bld 145 (*)    BUN 29 (*)    Creatinine, Ser 1.33 (*)    Calcium 8.5 (*)    Albumin 3.0 (*)    GFR, Estimated 38 (*)    All other components within normal  limits  CBC WITH DIFFERENTIAL/PLATELET - Abnormal; Notable for the following components:   WBC 11.3 (*)    RBC 3.69 (*)    Hemoglobin 10.2 (*)    HCT 33.2 (*)    RDW 16.2 (*)    Neutro Abs 9.6 (*)    Abs Immature Granulocytes 0.29 (*)    All other components within normal limits  APTT - Abnormal; Notable for the following components:  aPTT 23 (*)    All other components within normal limits  I-STAT CHEM 8, ED - Abnormal; Notable for the following components:   BUN 27 (*)    Creatinine, Ser 1.30 (*)    Glucose, Bld 142 (*)    TCO2 19 (*)    Hemoglobin 10.9 (*)    HCT 32.0 (*)    All other components within normal limits  RESP PANEL BY RT-PCR (FLU A&B, COVID) ARPGX2  CULTURE, BLOOD (ROUTINE X 2)  CULTURE, BLOOD (ROUTINE X 2)  URINE CULTURE  PROTIME-INR  LACTIC ACID, PLASMA  URINALYSIS, ROUTINE W REFLEX MICROSCOPIC  PROTIME-INR  APTT    EKG EKG Interpretation  Date/Time:  Monday April 19 2021 11:57:07 EST Ventricular Rate:  96 PR Interval:  140 QRS Duration: 89 QT Interval:  330 QTC Calculation: 417 R Axis:   105 Text Interpretation: Sinus rhythm Right axis deviation Nonspecific T abnormalities, lateral leads bigeminy resolved Otherwise no significant change Confirmed by Deno Etienne [id number]) on 04/19/2021 12:53:16 PM  Radiology CT Head Wo Contrast  Result Date: 04/19/2021 CLINICAL DATA:  Altered mental status EXAM: CT HEAD WITHOUT CONTRAST TECHNIQUE: Contiguous axial images were obtained from the base of the skull through the vertex without intravenous contrast. RADIATION DOSE REDUCTION: This exam was performed according to the departmental dose-optimization program which includes automated exposure control, adjustment of the mA and/or kV according to patient size and/or use of iterative reconstruction technique. 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 New no evidence of acute hemorrhage,  hydrocephalus, extra-axial collection or mass lesion/mass effect. Vascular: No hyperdense vessel or unexpected calcification. Skull: Normal. Negative for fracture or focal lesion. Sinuses/Orbits: Air-fluid level seen in the bilateral sphenoid sinuses. Other: None. IMPRESSION: 1. New acute/subacute appearing right temporoparietal infarct. 2. No evidence of intracranial hemorrhage. Critical Value/emergent results were called by telephone at the time of interpretation on 04/19/2021 at 12:47 pm to provider Demonica Farrey , who verbally acknowledged these results. Electronically Signed   By: Yetta Glassman M.D.   On: 04/19/2021 12:51   DG Chest Port 1 View  Result Date: 04/19/2021 CLINICAL DATA:  Syncopal episode.  Question sepsis. EXAM: PORTABLE CHEST 1 VIEW COMPARISON:  03/30/2021 FINDINGS: Worsened appearance since the study of 20 days ago. Borderline cardiomegaly and aortic atherosclerosis are again noted. There are developing bilateral pleural effusions which are moderate in size and associated with bilateral lower lobe volume loss. Lower lobe pneumonia not excluded. Upper lungs remain clear. IMPRESSION: Development of moderate bilateral pleural effusions with lower lobe atelectasis. Pneumonia not excluded. Electronically Signed   By: Nelson Chimes M.D.   On: 04/19/2021 12:48    Procedures Procedures    Medications Ordered in ED Medications  azithromycin (ZITHROMAX) 500 mg in sodium chloride 0.9 % 250 mL IVPB (500 mg Intravenous New Bag/Given 04/19/21 1430)  lactated ringers bolus 1,000 mL (1,000 mLs Intravenous New Bag/Given 04/19/21 1432)    And  lactated ringers bolus 1,000 mL (has no administration in time range)  cefTRIAXone (ROCEPHIN) 2 g in sodium chloride 0.9 % 100 mL IVPB (0 g Intravenous Stopped 04/19/21 1432)    ED Course/ Medical Decision Making/ A&P                           Medical Decision Making Amount and/or Complexity of Data Reviewed Labs: ordered. Radiology:  ordered. ECG/medicine tests: ordered.  Risk Decision regarding hospitalization.  88 yo F with a chief complaint of what sound like a near syncopal event.  Reportedly was at her baseline per EMS.  Answers yes and no questions for me and is somewhat limited.  Mildly warm to the touch.  Mildly tachypneic.  Will obtain laboratory evaluation chest x-ray UA.  Patient's records were reviewed and she recently had an 8-day hospitalization for urinary tract infection and fall with multiple metatarsal fractures.  Chest x-ray viewed by me with bilateral pleural effusions.  Lab work with a leukocytosis with a neutrophilic predominance.  Hypothermic here to 96.  With concern for possible infection will start on antibiotics..   CT scan of the head I received a call from the radiologist with concern for a new stroke.  Age indeterminant.  Will discuss with neurology.  I discussed case with Dr. Malen Gauze.  Recommended MRI and likely admission to [facility] primarily if able.  CRITICAL CARE Performed by: Cecilio Asper   Total critical care time: 35 minutes  Critical care time was exclusive of separately billable procedures and treating other patients.  Critical care was necessary to treat or prevent imminent or life-threatening deterioration.  Critical care was time spent personally by me on the following activities: development of treatment plan with patient and/or surrogate as well as nursing, discussions with consultants, evaluation of patient's response to treatment, examination of patient, obtaining history from patient or surrogate, ordering and performing treatments and interventions, ordering and review of laboratory studies, ordering and review of radiographic studies, pulse oximetry and re-evaluation of patient's condition.  The patients results and plan were reviewed and discussed.   Any x-rays performed were independently reviewed by myself.   Differential diagnosis were considered with the  presenting HPI.  Medications  azithromycin (ZITHROMAX) 500 mg in sodium chloride 0.9 % 250 mL IVPB (500 mg Intravenous New Bag/Given 04/19/21 1430)  lactated ringers bolus 1,000 mL (1,000 mLs Intravenous New Bag/Given 04/19/21 1432)    And  lactated ringers bolus 1,000 mL (has no administration in time range)  cefTRIAXone (ROCEPHIN) 2 g in sodium chloride 0.9 % 100 mL IVPB (0 g Intravenous Stopped 04/19/21 1432)    Vitals:   04/19/21 1415 04/19/21 1430 04/19/21 1445 04/19/21 1500  BP: (!) 165/76 (!) 173/81 (!) 174/88 (!) 196/162  Pulse: 81 97 89 89  Resp: (!) _0 (!) 25  Temp:      TempSrc:      SpO2: 96% 98% 99% 98%    Final diagnoses:  Near syncope  Community acquired pneumonia of right lower lobe of lung    Admission/ observation were discussed with the admitting physician, patient and/or family and they are comfortable with the plan.            Final Clinical Impression(s) / ED Diagnoses Final diagnoses:  Near syncope  Community acquired pneumonia of right lower lobe of lung    Rx / DC Orders ED Discharge Orders     None         Deno Etienne, DO 04/19/21 1528

## 2021-04-19 NOTE — Progress Notes (Signed)
Elink following code sepsis °

## 2021-04-19 NOTE — Progress Notes (Signed)
Pharmacy Antibiotic Note  Lisa Villegas is a 88 y.o. female admitted on 04/19/2021 with syncopal event.  Chest x-ray with bilateral pleural effusions, concern for HCAP with recent admit to hospital beginning of January and received IV Cipro and Rocephin. Pharmacy has been consulted for vancomycin and cefepime dosing.  WBC 11.3, LA 5, hypothermic. Last weight 58 kg from 04/01/21. Hx of CKD with current Scr 1.3, baseline ~1.2.  Estimated CrCl ~27 mL/min  Plan: Vancomycin 1250mg  IV x1, followed by  Vancomycin 1000mg  IV q48 hours (eAUC 477, Scr 1.3, Vd 0.72) Cefepime 2g IV q12 hours Follow-up updated weight for dosage adjustments as needed F/u MRSA PCR, clinical improvement, ability to narrow antibiotics    Temp (24hrs), Avg:96.1 F (35.6 C), Min:96.1 F (35.6 C), Max:96.1 F (35.6 C)  Recent Labs  Lab 04/19/21 1313 04/19/21 1314 04/19/21 1321  WBC  --  11.3*  --   CREATININE  --  1.33* 1.30*  LATICACIDVEN 5.0*  --   --     CrCl cannot be calculated (Unknown ideal weight.).    Allergies  Allergen Reactions   Ciprofloxacin Rash    Localized red rash to IV site   Penicillins Rash    On MAR: Tolerates multiple cephalosporins, including cephalexin.    Antimicrobials this admission: CTX/Azithromycin x1 in ED Vancomycin 1/23 >>  Cefepime 1/23 >>   Dose adjustments this admission:   Microbiology results: 1/23 BCx:  1/23 Resp panel: neg COVID, neg influenza A/B 1/23 MRSA PCR:  Thank you for allowing pharmacy to be a part of this patients care.  Dimple Nanas, PharmD 04/19/2021 3:54 PM

## 2021-04-19 NOTE — ED Notes (Signed)
Pt changed to level 1 acuity due to lactic Acid 5.0

## 2021-04-19 NOTE — Plan of Care (Signed)
Called by Dr. Marshia Ly CT with concern for a subacute infarct. Recommended MRI, admission to [facility] if possible so that if MRI is positive for stroke, she can be followed by stroke team. Please call for formal consult once MRI is done and positive for stroke.   -- Amie Portland, MD Neurologist [facility] Pager: [phone]

## 2021-04-20 ENCOUNTER — Inpatient Hospital Stay (HOSPITAL_COMMUNITY): Payer: Medicare Other

## 2021-04-20 ENCOUNTER — Other Ambulatory Visit (HOSPITAL_COMMUNITY): Payer: Medicare Other

## 2021-04-20 DIAGNOSIS — G936 Cerebral edema: Secondary | ICD-10-CM | POA: Diagnosis present

## 2021-04-20 DIAGNOSIS — J90 Pleural effusion, not elsewhere classified: Secondary | ICD-10-CM | POA: Diagnosis present

## 2021-04-20 DIAGNOSIS — N1831 Chronic kidney disease, stage 3a: Secondary | ICD-10-CM | POA: Diagnosis present

## 2021-04-20 DIAGNOSIS — I63411 Cerebral infarction due to embolism of right middle cerebral artery: Secondary | ICD-10-CM

## 2021-04-20 DIAGNOSIS — I639 Cerebral infarction, unspecified: Secondary | ICD-10-CM | POA: Diagnosis not present

## 2021-04-20 LAB — COMPREHENSIVE METABOLIC PANEL
ALT: 28 U/L (ref 0–44)
AST: 18 U/L (ref 15–41)
Albumin: 2.7 g/dL — ABNORMAL LOW (ref 3.5–5.0)
Alkaline Phosphatase: 100 U/L (ref 38–126)
Anion gap: 7 (ref 5–15)
BUN: 24 mg/dL — ABNORMAL HIGH (ref 8–23)
CO2: 21 mmol/L — ABNORMAL LOW (ref 22–32)
Calcium: 8.3 mg/dL — ABNORMAL LOW (ref 8.9–10.3)
Chloride: 106 mmol/L (ref 98–111)
Creatinine, Ser: 0.98 mg/dL (ref 0.44–1.00)
GFR, Estimated: 56 mL/min — ABNORMAL LOW (ref >60)
Glucose, Bld: 127 mg/dL — ABNORMAL HIGH (ref 70–99)
Potassium: 4.5 mmol/L (ref 3.5–5.1)
Sodium: 134 mmol/L — ABNORMAL LOW (ref 135–145)
Total Bilirubin: 0.4 mg/dL (ref 0.3–1.2)
Total Protein: 5.9 g/dL — ABNORMAL LOW (ref 6.5–8.1)

## 2021-04-20 LAB — GLUCOSE, CAPILLARY
Glucose-Capillary: 110 mg/dL — ABNORMAL HIGH (ref 70–99)
Glucose-Capillary: 114 mg/dL — ABNORMAL HIGH (ref 70–99)
Glucose-Capillary: 123 mg/dL — ABNORMAL HIGH (ref 70–99)

## 2021-04-20 LAB — LIPID PANEL
Cholesterol: 211 mg/dL — ABNORMAL HIGH (ref 0–200)
HDL: 53 mg/dL (ref >40)
LDL Cholesterol: 134 mg/dL — ABNORMAL HIGH (ref 0–99)
Total CHOL/HDL Ratio: 4 RATIO
Triglycerides: 119 mg/dL (ref <150)
VLDL: 24 mg/dL (ref 0–40)

## 2021-04-20 LAB — CBC
HCT: 27.9 % — ABNORMAL LOW (ref 36.0–46.0)
Hemoglobin: 8.7 g/dL — ABNORMAL LOW (ref 12.0–15.0)
MCH: 27.9 pg (ref 26.0–34.0)
MCHC: 31.2 g/dL (ref 30.0–36.0)
MCV: 89.4 fL (ref 80.0–100.0)
Platelets: 280 10*3/uL (ref 150–400)
RBC: 3.12 MIL/uL — ABNORMAL LOW (ref 3.87–5.11)
RDW: 16.1 % — ABNORMAL HIGH (ref 11.5–15.5)
WBC: 8.1 10*3/uL (ref 4.0–10.5)
nRBC: 0 % (ref 0.0–0.2)

## 2021-04-20 LAB — PROTIME-INR
INR: 1.1 (ref 0.8–1.2)
Prothrombin Time: 14.6 seconds (ref 11.4–15.2)

## 2021-04-20 LAB — HEMOGLOBIN A1C
Hgb A1c MFr Bld: 6.8 % — ABNORMAL HIGH (ref 4.8–5.6)
Mean Plasma Glucose: 148 mg/dL

## 2021-04-20 LAB — PROCALCITONIN: Procalcitonin: <0.1 ng/mL

## 2021-04-20 LAB — CBG MONITORING, ED
Glucose-Capillary: 108 mg/dL — ABNORMAL HIGH (ref 70–99)
Glucose-Capillary: 108 mg/dL — ABNORMAL HIGH (ref 70–99)

## 2021-04-20 LAB — CORTISOL-AM, BLOOD: Cortisol - AM: 11.5 ug/dL (ref 6.7–22.6)

## 2021-04-20 MED ORDER — SODIUM CHLORIDE 0.9 % IV SOLN: INTRAVENOUS | Status: DC

## 2021-04-20 MED ORDER — ASPIRIN 300 MG RE SUPP: 300.0000 mg | Freq: Every day | RECTAL | Status: DC

## 2021-04-20 MED ORDER — PANTOPRAZOLE SODIUM 40 MG IV SOLR
40.0000 mg | Freq: Once | INTRAVENOUS | Status: AC
  Administered 2021-04-20: 23:00:00 40 mg via INTRAVENOUS
  Filled 2021-04-20: qty 40

## 2021-04-20 MED ORDER — ASPIRIN 325 MG PO TABS
325.0000 mg | ORAL_TABLET | Freq: Every day | ORAL | Status: DC
  Administered 2021-04-21: 11:00:00 325 mg via ORAL

## 2021-04-20 MED ORDER — PANTOPRAZOLE SODIUM 40 MG IV SOLR
40.0000 mg | INTRAVENOUS | Status: DC
  Administered 2021-04-21 – 2021-04-26 (×6): 40 mg via INTRAVENOUS
  Filled 2021-04-20 (×6): qty 40

## 2021-04-20 MED ORDER — INSULIN ASPART 100 UNIT/ML IJ SOLN
0.0000 [IU] | INTRAMUSCULAR | Status: DC
  Administered 2021-04-20 – 2021-04-21 (×2): 1 [IU] via SUBCUTANEOUS
  Administered 2021-04-21: 20:00:00 3 [IU] via SUBCUTANEOUS
  Administered 2021-04-21: 1 [IU] via SUBCUTANEOUS
  Administered 2021-04-21: 16:00:00 2 [IU] via SUBCUTANEOUS
  Administered 2021-04-22 – 2021-04-23 (×5): 1 [IU] via SUBCUTANEOUS
  Administered 2021-04-23: 5 [IU] via SUBCUTANEOUS
  Administered 2021-04-23: 2 [IU] via SUBCUTANEOUS
  Administered 2021-04-24: 3 [IU] via SUBCUTANEOUS
  Administered 2021-04-24: 1 [IU] via SUBCUTANEOUS
  Administered 2021-04-24: 2 [IU] via SUBCUTANEOUS
  Administered 2021-04-24 – 2021-04-25 (×3): 1 [IU] via SUBCUTANEOUS
  Administered 2021-04-25: 2 [IU] via SUBCUTANEOUS
  Administered 2021-04-25 (×2): 1 [IU] via SUBCUTANEOUS
  Administered 2021-04-26: 2 [IU] via SUBCUTANEOUS
  Administered 2021-04-26 – 2021-04-27 (×3): 1 [IU] via SUBCUTANEOUS
  Filled 2021-04-20: qty 0.09

## 2021-04-20 NOTE — Assessment & Plan Note (Addendum)
Continue PPI ?

## 2021-04-20 NOTE — Assessment & Plan Note (Signed)
Reported history.  Unable to verify.

## 2021-04-20 NOTE — Assessment & Plan Note (Addendum)
Probably related to HFpEF-or from pulmonary embolism.  Given the fact that she is asymptomatic/frail/on anticoagulation-would avoid thoracocentesis is much as possible-as this is highly likely that this is a transudate.  Will need to repeat chest x-rays in the outpatient setting.

## 2021-04-20 NOTE — Consult Note (Addendum)
Neurology Consultation  Reason for Consult: Transfer from [facility] for MRI Referring Physician: Deno Etienne, DO  CC: Stroke work-up  History is obtained from: Chart, ALF staff [facility] [phone])  HPI: Lisa Villegas is a 88 y.o. female with PMHx of HTN, chronic diastolic CHF, A3FT, HLD, and CKD Stage IIIa who presented to [facility] from ALF for acute onset weakness and hypoxia. Per ALF, patient was walking with PT approx 10:15 AM when she had acute onset weakness, collapsing to the floor. She was then put into a wheelchair and was noted to become lethargic, changing to a grayish color in hue with a bluish tinge to her lips, and nonresponsive. Pulse ox revealed O2 sat at 80% (previously sat 98% while walking with PT), and she was given 8L O2 via [place] restoring her color. She was then minimally responsive, and taken to [facility] via EMS. Patient's only blood thinner is Aspirin 81 mg daily.    LKW: 1/23 approx 10-10:30 AM TNK given?: no, outside of window IR Thrombectomy? No Modified Rankin Scale: 4-Needs assistance to walk and tend to bodily needs  ROS: A complete ROS was performed and is negative except as noted in the HPI.   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[place])    Autism    Chronic CHF (congestive heart failure) ([place])    per med rec   Chronic kidney disease (CKD)    stage 3 per chart in 09/2015   Diabetes mellitus without complication (HCC)    Diabetic neuropathy (HCC)    History of thyroid nodule    nontoxic goiter per med rec   Hodgkin disease ([place])    in 1980   HTN (hypertension), benign    Hyperlipidemia    Incontinence    Lack of sensation    Osteopenia    per med rec   Poor balance    Stroke ([place])    Tubular adenoma of colon 01/2017   Vitamin D deficiency     Family History  Problem Relation Age of Onset   CAD Mother        died at 72 yo   Atrial fibrillation Mother        and tachycardia    CAD Father        died at 110 yo   Atrial fibrillation Father        and tachycardia    Social History:   reports that she has never smoked. She has never used smokeless tobacco. She reports that she does not drink alcohol and does not use drugs.  Medications  Current Facility-Administered Medications:     stroke: mapping our early stages of recovery book, , Does not apply, Once, Marylyn Ishihara, Tyrone A, DO   0.9 %  sodium chloride infusion, , Intravenous, Continuous, Kyle, Tyrone A, DO, Last Rate: 75 mL/hr at 04/19/21 2218, New Bag at 04/19/21 2218   [START ON 04/21/2021] aspirin suppository 300 mg, 300 mg, Rectal, Daily **OR** [START ON 04/21/2021] aspirin tablet 325 mg, 325 mg, Oral, Daily, Bell, Michelle T, RPH   insulin aspart (novoLOG) injection 0-9 Units, 0-9 Units, Subcutaneous, Q4H, Lavina Hamman, MD   oxybutynin (DITROPAN-XL) 24 hr tablet 5 mg, 5 mg, Oral, BID, Kyle, Tyrone A, DO   Exam: Current vital signs: BP (!) 216/118 (BP Location: Right Arm)    Pulse 84    Temp 98.5 F (36.9 C) (  Oral)    Resp (!) 23    SpO2 99%  Vital signs in last 24 hours: Temp:  [97.7 F (36.5 C)-98.5 F (36.9 C)] 98.5 F (36.9 C) (01/24 1421) Pulse Rate:  [46-104] 84 (01/24 1421) Resp:  [11-26] 23 (01/24 1421) BP: (132-220)/(63-201) 216/118 (01/24 1421) SpO2:  [84 %-100 %] 99 % (01/24 1421)  GENERAL: Awake, alert, in no acute distress Psych: Affect appropriate for situation, patient is calm and cooperative with examination Head: Normocephalic and atraumatic, without obvious abnormality EENT: Normal conjunctivae, dry mucous membranes, no OP obstruction LUNGS: Normal respiratory effort. Non-labored breathing on room air CV: Regular rate and rhythm on telemetry ABDOMEN: Soft, non-tender, non-distended Extremities: warm, well perfused, without obvious deformity  NEURO:  Mental Status: Awake, alert, and oriented to person, place, and time (must lower mask for patient to hear appropriately). She is  unable to provide a clear and coherent history of present illness. Speech/Language: speech is clear without dysarthria.   Fluency intact without aphasia; comprehension with some aphasia. Patient asked age, and responded "yeah" followed by some nonsensical speech before responding 89. Cranial Nerves:  II: UTA pupils d/t patient refusal. visual fields with some L sided deficits.  III, IV, VI: EOMI. Lid elevation symmetric and full.  V: Sensation is intact to light touch and symmetrical to face. Blinks to threat. Moves jaw back and forth.  VII: Face is symmetric resting and smiling. Able to raise eyebrows.  VIII: Hearing intact to voice IX, X: Palate elevation is symmetric. Phonation normal.  XI: Normal sternocleidomastoid and trapezius muscle strength XII: Tongue protrudes midline without fasciculations.   Motor: 3/5 strength in all muscle groups. Unable to properly assess UE flexors and LE strength as patient did not appear to understand some commands despite repeating multiple times. Tone is normal. Bulk is normal.  Sensation: Intact to light touch bilaterally in all four extremities. No extinction to DSS present.  Coordination: FTN with difficulty bilaterally 2/2 tremulousness. No pronator drift. DTRs: 2+ throughout.  Gait: Deferred  NIHSS: 1a Level of Conscious.: 0 1b LOC Questions: 0 1c LOC Commands: 0 2 Best Gaze: 0 3 Visual: 1 4 Facial Palsy: 0 5a Motor Arm - left: 0 5b Motor Arm - Right: 0 6a Motor Leg - Left: 0 6b Motor Leg - Right: 0 7 Limb Ataxia: 2 8 Sensory: 0 9 Best Language: 1 10 Dysarthria: 0 11 Extinct. and Inatten.: 1 TOTAL: 5   Labs I have reviewed labs in epic and the results pertinent to this consultation are:  CBC    Component Value Date/Time   WBC 8.1 04/20/2021 0534   RBC 3.12 (L) 04/20/2021 0534   HGB 8.7 (L) 04/20/2021 0534   HCT 27.9 (L) 04/20/2021 0534   PLT 280 04/20/2021 0534   MCV 89.4 04/20/2021 0534   MCH 27.9 04/20/2021 0534   MCHC  31.2 04/20/2021 0534   RDW 16.1 (H) 04/20/2021 0534   LYMPHSABS 0.9 04/19/2021 1314   MONOABS 0.4 04/19/2021 1314   EOSABS 0.0 04/19/2021 1314   BASOSABS 0.1 04/19/2021 1314    CMP     Component Value Date/Time   NA 134 (L) 04/20/2021 0534   K 4.5 04/20/2021 0534   CL 106 04/20/2021 0534   CO2 21 (L) 04/20/2021 0534   GLUCOSE 127 (H) 04/20/2021 0534   BUN 24 (H) 04/20/2021 0534   CREATININE 0.98 04/20/2021 0534   CREATININE 0.79 11/29/2016 0847   CALCIUM 8.3 (L) 04/20/2021 0534   PROT 5.9 (L)  04/20/2021 0534   ALBUMIN 2.7 (L) 04/20/2021 0534   AST 18 04/20/2021 0534   ALT 28 04/20/2021 0534   ALKPHOS 100 04/20/2021 0534   BILITOT 0.4 04/20/2021 0534   GFRNONAA 56 (L) 04/20/2021 0534   GFRNONAA 69 11/29/2016 0847   GFRAA >60 12/06/2019 0229   GFRAA 79 11/29/2016 0847    Lipid Panel     Component Value Date/Time   CHOL 211 (H) 04/20/2021 0534   TRIG 119 04/20/2021 0534   HDL 53 04/20/2021 0534   CHOLHDL 4.0 04/20/2021 0534   VLDL 24 04/20/2021 0534   LDLCALC 134 (H) 04/20/2021 0534   A1c pending  Imaging I have reviewed the images obtained:  CT-scan of the brain: Acute R temporoparietal infarct and old L frontal infarct.; no acute hemorrhagic changes  MRI examination of the brain: Large acute R posterior MCA infarct.  Assessment: Patient is an 88 year old female transferred from [facility] for full stroke work-up. Patient with new R sided MCA stroke. She will need further imaging to assess for any vasculature changes/determine a source.   Recommendations: -Telemetry monitoring -Allow for permissive hypertension for 24h additionally, as patient was admitted to [facility] for 24 hours - only treat PRN if SBP >220 mmHg. Blood pressures can be gradually normalized to SBP<140 upon discharge. -MRA head without contrast, MRA neck without contrast d/t h/o CKD3a -Carotid dopplers -CT Angiogram of Head and neck -Echocardiogram ordered by primary team; patient refused to have it  completed today. Advise to attempt again at a later time. -Frequent neuro checks -Prophylactic therapy-Antiplatelet med: Aspirin - dose 325mg  PO - Recommend Atorvastatin 80 mg PO daily pending patient passing SLP swallow study -Risk factor modification -PT consult, OT consult, Speech consult -Stroke team to follow -If Afib found on telemetry, will need anticoagulation. Decision pending imaging and stroke team rounding.   Pt seen by Neuro Psych Resident and later by attending MD. Note/plan to be edited by MD as needed.    Rosezetta Schlatter, MD PGY-1 04/20/2021  3:22 PM    Attending addendum   CC/HPI Patient seen and examined with the resident at the request of Dr. Marylyn Ishihara, hospitalist Patient unable to provide clear history History obtained from chart review 88 year old woman with past history of chronic diastolic heart failure, diabetes, hypertension, hyperlipidemia, CKD 3A who presented to the [facility] emergency room for concerns of syncope.  She was apparently brought from her assisted living after syncopal event, felt weak overall and passed out without hitting her head.  She remained confused upon coming to.  EMS was called for help.  Head CT in [facility] showed a subacute appearing right temporoparietal infarct.  I was called over the phone and recommended MRI and possible transfer to [facility] if positive for stroke.  MRI was completed and revealed right temporoparietal infarct.  She was transferred to [facility] for further evaluation   URK:[id number] above mRS: documented above.   Review of system: She is unable to provide due to her altered mentation.   Past history: As above   Medication list: Reviewed   Labs: Reviewed-see details above.   Imaging personally reviewed:Moderately large acute to early subacute posterior right MCA infarct.  Small chronic infarcts as above.   On examination: She is awake, alert, oriented to self, she was able to tell me her  correct age, speech is mildly dysarthric.  Poor attention concentration.  Mild aphasia also noted.  On cranial nerve examination, she is able to track  the examiner, pupils are equal-difficult to check for reactivity as she keeps closing her eyes, question some left-sided field deficit-difficult to elicit with her cooperation, face symmetric.  She is antigravity in all fours without drift.  Sensation seems to be intact in all fours. NIH stroke scale 1a Level of Conscious.: 0 1b LOC Questions: 0 1c LOC Commands: 0 2 Best Gaze: 0 3 Visual: 1 4 Facial Palsy: 0 5a Motor Arm - left: 0 5b Motor Arm - Right: 0 6a Motor Leg - Left: 0 6b Motor Leg - Right: 0 7 Limb Ataxia: 0 8 Sensory: 0 9 Best Language: 1 10 Dysarthria: 1 11 Extinct. and Inatten.: 0 TOTAL: 3   Assessment and recommendations: Right MCA stroke-etiology under investigation Complete stroke work up   CT head and neck 2D echo A1c Lipid panel Frequent neurochecks Telemetry PT OT Speech therapy Permissive hypertension for another day or so and then normalize blood pressure. Prior to arrival on aspirin 81-continue. High intensity statin Stroke team will follow.   -- Amie Portland, MD Neurologist [facility] Pager: [phone]

## 2021-04-20 NOTE — Evaluation (Signed)
Physical Therapy Evaluation Patient Details Name: Lisa Villegas MRN: [id number] DOB: 09/05/1930 Today's Date: 04/20/2021  History of Present Illness  Lisa Villegas is a 88 y.o. female with medical history significant of chronic diastolic HF, XBJ4N, DM2, HTN, HLD.autism, Presenting with syncope from  SNF on 04/19/21.Marland Kitchen   MRI-Moderately large acute to early subacute posterior right MCA  infarct.  Clinical Impression  The patient is awake, feet hanging off end of bed.  Patient requiring 2 max/total assistance to roll to change linens and depends. Patient  did sit upright on the stretcher x 1. Also performed bedpan lift after much efforts in bed mobility. Recommend return to SNF. Pt admitted with above diagnosis.  Pt currently with functional limitations due to the deficits listed below (see PT Problem List). Pt will benefit from skilled PT to increase their independence and safety with mobility to allow discharge to the venue listed below.    Patient  noted to  desat on RA to 84%. Replaced 5 L with SPO2 back to 92%. HR max 112.         Recommendations for follow up therapy are one component of a multi-disciplinary discharge planning process, led by the attending physician.  Recommendations may be updated based on patient status, additional functional criteria and insurance authorization.  Follow Up Recommendations Skilled nursing-short term rehab (<3 hours/day)    Assistance Recommended at Discharge Frequent or constant Supervision/Assistance  Patient can return home with the following  Two people to help with walking and/or transfers;Two people to help with bathing/dressing/bathroom;Assist for transportation;Help with stairs or ramp for entrance;Direct supervision/assist for medications management;Assistance with feeding    Equipment Recommendations None recommended by PT  Recommendations for Other Services       Functional Status Assessment Patient has had a recent decline in their  functional status and demonstrates the ability to make significant improvements in function in a reasonable and predictable amount of time.     Precautions / Restrictions Precautions Precautions: Fall Precaution Comments: Autistic, Difficulty following directions, from another encounter. Required Braces or Orthoses: Splint/Cast Splint/Cast: post op shoe on right foot Restrictions RLE Weight Bearing: Weight bearing as tolerated      Mobility  Bed Mobility   Bed Mobility: Supine to Sit           General bed mobility comments: patient very restless and resistive to rolling for pads to be changed. patient did sit forward in long sitting, also performed bedpan lift anfter much efforts for rolling.    Transfers                   General transfer comment: NT due to patient being so resisteent to rolling and positioning in bed, does not keep legs flexed when placed.    Ambulation/Gait                  Stairs            Wheelchair Mobility    Modified Rankin (Stroke Patients Only)       Balance       Sitting balance - Comments: patient sat on stretxcher in long sitting with min guard.                                     Pertinent Vitals/Pain Pain Assessment Pain Assessment: No/denies pain    Home Living Family/patient expects to be discharged to:: Skilled  nursing facility                   Additional Comments: [facility].    Prior Function               Mobility Comments: recent hospital notes report mod assistnace of 2 for mobility and transfers       Hand Dominance        Extremity/Trunk Assessment        Lower Extremity Assessment RLE Deficits / Details: post op shoe on foot, Patient able to flex and extend legs, does not keep position when placed. LLE Deficits / Details: lifts leg from bed,       Communication   Communication: Expressive difficulties  Cognition  Arousal/Alertness: Awake/alert Behavior During Therapy: Restless Overall Cognitive Status: History of cognitive impairments - at baseline                                 General Comments: does  follow simple directions with much cues and assistnace of 2. More resistive to mobility  efforts.        General Comments      Exercises     Assessment/Plan    PT Assessment Patient needs continued PT services  PT Problem List Decreased strength;Decreased mobility;Decreased safety awareness;Decreased knowledge of precautions;Decreased activity tolerance;Decreased cognition;Decreased knowledge of use of DME       PT Treatment Interventions DME instruction;Therapeutic activities;Cognitive remediation;Gait training;Therapeutic exercise;Patient/family education;Functional mobility training    PT Goals (Current goals can be found in the Care Plan section)  Acute Rehab PT Goals PT Goal Formulation: Patient unable to participate in goal setting Time For Goal Achievement: 05/04/21 Potential to Achieve Goals: Fair    Frequency Min 2X/week     Co-evaluation PT/OT/SLP Co-Evaluation/Treatment: Yes Reason for Co-Treatment: Complexity of the patient's impairments (multi-system involvement);Necessary to address cognition/behavior during functional activity;To address functional/ADL transfers PT goals addressed during session: Mobility/safety with mobility OT goals addressed during session: ADL's and self-care       AM-PAC PT "6 Clicks" Mobility  Outcome Measure Help needed turning from your back to your side while in a flat bed without using bedrails?: Total Help needed moving from lying on your back to sitting on the side of a flat bed without using bedrails?: Total Help needed moving to and from a bed to a chair (including a wheelchair)?: Total Help needed standing up from a chair using your arms (e.g., wheelchair or bedside chair)?: Total Help needed to walk in hospital room?:  Total Help needed climbing 3-5 steps with a railing? : Total 6 Click Score: 6    End of Session   Activity Tolerance:  (limited by lack of following directions) Patient left: in bed;with call bell/phone within reach;with bed alarm set Nurse Communication: Mobility status PT Visit Diagnosis: History of falling (Z91.81);Other abnormalities of gait and mobility (R26.89)    Time: 1779-3903 PT Time Calculation (min) (ACUTE ONLY): 25 min   Charges:   PT Evaluation $PT Eval Moderate Complexity: [place] Pager [phone] Office [phone]   Claretha Cooper 04/20/2021, 11:19 AM

## 2021-04-20 NOTE — Hospital Course (Signed)
Lisa Villegas is a 88 y.o. female with medical history significant of chronic diastolic HF, XLE1V, DM2, HTN, HLD. Presenting with syncope.  1/23 presents from ALF.  Started on empiric antibiotic.  Work-up in the ER shows evidence of acute stroke in the right MCA territory. 1/24 transfer to [facility].  Discontinue antibiotics given negative work-up.  EEG ordered.

## 2021-04-20 NOTE — Progress Notes (Signed)
Echocardiogram not completed, patient is currently refusing the exam.  Lisa Villegas

## 2021-04-20 NOTE — Progress Notes (Signed)
Her name Progress Note   Patient: Lisa Villegas RXV:[id number] DOB: October 19, 1930 DOA: 04/19/2021     1 DOS: the patient was seen and examined on 04/20/2021   Brief hospital course: Veronnica Hennings is a 88 y.o. female with medical history significant of chronic diastolic HF, JKD3O, DM2, HTN, HLD. Presenting with syncope.  1/23 presents from ALF.  Started on empiric antibiotic.  Work-up in the ER shows evidence of acute stroke in the right MCA territory. 1/24 transfer to [facility].  Discontinue antibiotics given negative work-up.  EEG ordered.  Assessment and Plan * CVA (cerebral vascular accident) ([place])- (present on admission) Acute right MCA territory infarct mostly embolic. Cytotoxic cerebral edema. Chronic left MCA infarct Left occipital chronic hemorrhage. Presented from ALF with a syncopal event in the wheelchair.  History is limited as the patient is not able to provide any information. Due to altered mental status CT head performed concerning for acute stroke. MRI brain moderately large acute to early subacute posterior right MCA infarct involving temporal lobe, occipital lobe and insula. Small acute infarct along right caudate nucleus. MRI also confirms cytotoxic edema with sulcal effacement and slight mass-effect on the temporal horn. Neurology consulted. Patient currently transferred to [place] consulted.  Most likely will require SNF. Remain n.p.o. for now.  Speech consulted. Hemoglobin A1c currently pending. LDL 134.  Will need to initiate high intensity statin once able to take p.o. Continue aspirin suppository. Permissive hypertension.   Bilateral pleural effusion- (present on admission) Seen incidentally on chest x-ray. Currently mildly hypoxic. Discontinue IV fluid.  If renal function remains stable may benefit from IV Lasix.   Syncope- (present on admission) Presented as a syncopal event. Patient was ambulating and suddenly felt  unwell and staff at the ALF brought the patient down. No head injury or neck injury reported. No loss of control of bowel or bladder function reported. Check EEG. Echocardiogram currently pending.  SIRS (systemic inflammatory response syndrome) ([place])- (present on admission) Hypothermic with normal WBC count on admission chest x-ray showing possibility of pneumonia with bilateral pleural effusion. Procalcitonin level negative twice. MRSA PCR negative. Patient was started on IV antibiotics empirically due to presentation with confusion. At present I will hold all antibiotics and monitor off of them. Follow-up on cultures.  Lactic acidosis- (present on admission) Lactic acid level 5.0 on admission.  Trending down to normal now after IV hydration. Unsure patient was hypoperfused. Possibility of seizure cannot be ruled out. Check EEG.  Acute renal failure superimposed on stage 3a chronic kidney disease ([place])- (present on admission) Baseline serum creatinine around 1.  On presentation serum creatinine 1.33.  Improving with IV hydration to 0.98.  Monitor.  Hypertensive urgency- (present on admission) Blood pressure remains elevated although allowing permissive hypertension in the setting of acute stroke.  Acute metabolic encephalopathy- (present on admission) Presented with confusion on admission. Had lactic acidosis [place] at baseline per EMS on arrival. Monitor.  Hyperlipidemia- (present on admission) On no medication prior to admission. Will require higher intensity statin once allowed to take p.o.  GERD (gastroesophageal reflux disease)- (present on admission) On omeprazole. May require IV PPI if not able to take anything p.o. tomorrow.  Malnutrition of moderate degree- (present on admission) Recently seen on last admission and evaluated by dietitian. Monitor for now while NPO.  FTT (failure to thrive) in adult- (present on admission) Prognosis is guarded in the setting  of large stroke. Will consult palliative care.  Multiple closed fractures  of metatarsal bone of right foot- (present on admission) Recent admission for the same. PT consulted.  History of Hodgkin's disease Reported history.  Unable to verify.     Subjective: Minimally responsive.  No nausea no vomiting.  Intermittently following commands.  No acute events overnight.  Objective  Vitals:   04/20/21 1200 04/20/21 1245 04/20/21 1330 04/20/21 1421  BP: (!) 204/88 (!) 204/111 (!) 209/89 (!) 216/118  Pulse: 72 70 81 84  Resp: (!) 24 19 (!) 21 (!) 23  Temp:    98.5 F (36.9 C)  TempSrc:    Oral  SpO2: 99% 100% 98% 99%    General: Appear in mild distress, no Rash; Oral Mucosa Clear, dry. no Abnormal Neck Mass Or lumps, Conjunctiva normal  Cardiovascular: S1 and S2 Present, no Murmur, Respiratory: Increased respiratory effort, Bilateral Air entry present and CTA, no Crackles, no wheezes Abdomen: Bowel Sound present, Soft and no tenderness Extremities: Trace pedal edema, right foot in a boot. Neurology: alert and not oriented to time, place, and person unable to follow commands.  Unable to participate further in exam. Gait not checked due to patient safety concerns   Data Reviewed:  I have Reviewed nursing notes, Vitals, and Lab results since pt's last encounter. Pertinent lab results MP shows improving serum creatinine, lipid panel shows 134 LDL 1 lactic acid level trending down, procalcitonin level negative CBC shows hemoglobin trending down from 10.2-8.7 I have ordered test including CBC, BMP I have ordered imaging studies chest x-ray. I have independently visualized and interpreted imaging chest x-ray which showed bilateral pleural effusion. I have independently visualized and interpreted EKG which showed EKG: normal EKG, normal sinus rhythm, unchanged from previous tracings. I have reviewed the last note from neurology,    Family Communication: None at  bedside.  Disposition: Status is: Inpatient  Remains inpatient appropriate because: Ongoing confusion, inability to follow commands or take anything p.o.  Presentation with acute stroke requiring further work-up.         Author: Berle Mull, MD 04/20/2021 4:32 PM  For on call review [email or web address].

## 2021-04-20 NOTE — Assessment & Plan Note (Addendum)
Probably due to AKI/CVA/possible VTE-she is back to her baseline.

## 2021-04-20 NOTE — Progress Notes (Signed)
EEG complete - results pending 

## 2021-04-20 NOTE — Assessment & Plan Note (Addendum)
Noninfectious etiology-no signs of infection.  Being monitored off antimicrobial therapy.  Follow cultures.

## 2021-04-20 NOTE — Assessment & Plan Note (Addendum)
Suspect due to hypoperfusion/dehydration-no indication of infection at this point.  Doubt any clinical significance at this point-has been adequately hydrated.

## 2021-04-20 NOTE — Assessment & Plan Note (Addendum)
Initially thought to be vasovagal or orthostatic etiology-now with extensive DVT-in slightly decreased RV systolic function on echo-suspect was from from VTE/pulmonary embolism.  EEG without seizures.  Already on anticoagulation-further work-up including a CTA chest would not change management-as she is already on anticoagulation.

## 2021-04-20 NOTE — Assessment & Plan Note (Addendum)
Continue supplements per dietitian.

## 2021-04-20 NOTE — Assessment & Plan Note (Addendum)
Prognosis is guarded in the setting of large stroke.  Appears frail at baseline-with numerous medical problems-do not think she is a good candidate for aggressive care-DNR in place.Marland Kitchen  Appreciate palliative care evaluation.  Spoke at length with POA [name] on 1/26.

## 2021-04-20 NOTE — Assessment & Plan Note (Addendum)
AKI likely hemodynamically mediated-improved with supportive care.  Continue to follow electrolytes periodically.

## 2021-04-20 NOTE — Progress Notes (Signed)
Occupational Therapy Evaluation  Patient is originally from ALF with recent admission in beginning of Jan due to fall with R metatarsal fractures, WBAT with darco shoe. Per chart review patient was ambulatory at ALF unsure of level of assistance required for self care tasks. During previous hospitalization patient needing +2 assist for transfers and bed mobility and often resistive unless wanting to perform tasks of own volition. Patient presenting with similar needs this hospitalization demonstrating intact functional UE strength due to pulling on bed rails and resisting during bed mobility while trying to change soiled linens. Patient intermittently following directions, does better when semi-supine in bed and not being asked to mobilize. Verbalizations limited to yes and no answers. Will trial acute OT services in order to work on functional transfers to minimize burden of care. Anticipate patient will perform better once around familiar staff and would benefit from D/C back to ALF environment if they can provide current assist levels.    04/20/21 1400  OT Visit Information  Last OT Received On 04/20/21  Assistance Needed +2  PT/OT/SLP Co-Evaluation/Treatment Yes  Reason for Co-Treatment To address functional/ADL transfers;For patient/therapist safety  PT goals addressed during session Mobility/safety with mobility  OT goals addressed during session ADL's and self-care  History of Present Illness Deisy Ozbun is a 88 y.o. female Presenting with syncope from ALF on 04/19/21. MRI-Moderately large acute to early subacute posterior right MCA  infarct. PMH: chronic diastolic HF, PPI9J, DM2, HTN, HLD.autism, recent R metatarsal fx  Precautions  Precautions Fall  Precaution Comments Autistic, Difficulty following directions, from another encounter.  Required Braces or Orthoses Splint/Cast  Splint/Cast post op shoe on right foot  Restrictions  Weight Bearing Restrictions No  RLE Weight Bearing  WBAT  Home Living  Family/patient expects to be discharged to: Assisted living  Type of Home Assisted living  Additional Comments recently [facility].  Prior Function  Prior Level of Function  Patient poor historian/Family not available  Communication  Communication Expressive difficulties  Pain Assessment  Pain Assessment Faces  Faces Pain Scale 0  Cognition  Arousal/Alertness Awake/alert  Behavior During Therapy Restless  Overall Cognitive Status History of cognitive impairments - at baseline  General Comments Does follow simple directions with max cues and assistance of 2. More resistive to mobility efforts.  Upper Extremity Assessment  Upper Extremity Assessment Overall WFL for tasks assessed;Difficult to assess due to impaired cognition  LUE Deficits / Details Functionally patient is pulling on bed rails, unable to formally assess/  Lower Extremity Assessment  Lower Extremity Assessment Defer to PT evaluation  ADL  Overall ADL's  Needs assistance/impaired  Eating/Feeding Set up;Bed level  Grooming Set up;Bed level  Upper Body Bathing Maximal assistance;Bed level  Lower Body Bathing Total assistance;Bed level  Upper Body Dressing  Maximal assistance;Bed level  Lower Body Dressing Total assistance;Bed level  Toilet Transfer Details (indicate cue type and reason) did not assess as patient resistive with bed mobility  Toileting- Clothing Manipulation and Hygiene Total assistance  Toileting - Clothing Manipulation Details (indicate cue type and reason) incontinent - found in diaper  General ADL Comments Patient with limited participation in bed mobility and resistive at times and not participatory in ADL tasks at this time  Bed Mobility  Overal bed mobility Needs Assistance  Bed Mobility Supine to Sit;Rolling  Rolling Max assist;+2 for physical assistance  General bed mobility comments patient very restless and resistive to rolling for pads to be changed. patient  did sit forward  in long sitting, also performed bridging with hips much efforts for rolling.  Transfers  General transfer comment NT due to patient being so resistent to rolling and positioning in bed, does not keep legs flexed when placed.  OT - End of Session  Activity Tolerance  (Treatment limited secondary patient participation)  Patient left in bed;with call bell/phone within reach;with bed alarm set  Nurse Communication Mobility status  OT Assessment  OT Recommendation/Assessment Patient needs continued OT Services  OT Visit Diagnosis Other symptoms and signs involving cognitive function  OT Problem List Impaired balance (sitting and/or standing);Decreased activity tolerance  OT Plan  OT Frequency (ACUTE ONLY) Min 2X/week  OT Treatment/Interventions (ACUTE ONLY) Self-care/ADL training;Therapeutic exercise;DME and/or AE instruction;Therapeutic activities;Balance training;Patient/family education;Cognitive remediation/compensation  AM-PAC OT "6 Clicks" Daily Activity Outcome Measure (Version 2)  Help from another person eating meals? 3  Help from another person taking care of personal grooming? 3  Help from another person toileting, which includes using toliet, bedpan, or urinal? 1  Help from another person bathing (including washing, rinsing, drying)? 2  Help from another person to put on and taking off regular upper body clothing? 2  Help from another person to put on and taking off regular lower body clothing? 1  6 Click Score 12  Progressive Mobility  What is the highest level of mobility based on the progressive mobility assessment? Level 2 (Chairfast) - Balance while sitting on edge of bed and cannot stand  Activity Dangled on edge of bed  OT Recommendation  Follow Up Recommendations Other (comment) (Return to ALF vs SNF if ALF unable to provide level of assist)  Assistance recommended at discharge Frequent or constant Supervision/Assistance  Patient can return home with the  following A lot of help with bathing/dressing/bathroom;Two people to help with walking and/or transfers  Functional Status Assessent Patient has had a recent decline in their functional status and/or demonstrates limited ability to make significant improvements in function in a reasonable and predictable amount of time  OT Equipment None recommended by OT  Individuals Consulted  Consulted and Agree with Results and Recommendations Patient unable/family or caregiver not available  Acute Rehab OT Goals  Patient Stated Goal unable  OT Goal Formulation Patient unable to participate in goal setting  Time For Goal Achievement 05/04/21  Potential to [place]  OT Time Calculation  OT Start Time (ACUTE ONLY) 0931  OT Stop Time (ACUTE ONLY) 0954  OT Time Calculation (min) 23 min  OT General Charges  $OT Visit 1 Visit  OT Evaluation  $OT Eval Low Complexity 1 Low  Written Expression  Dominant Hand Right   Delbert Phenix OT OT pager: [phone]

## 2021-04-20 NOTE — Assessment & Plan Note (Addendum)
Continue high intensity statin.  Repeat lipid panel in 3 months.

## 2021-04-20 NOTE — Assessment & Plan Note (Addendum)
Recent admission for the same.  Resume outpatient follow-up with orthopedics.

## 2021-04-20 NOTE — Assessment & Plan Note (Addendum)
BP stable-continue Verapmail, Metoprolol, Hydaralazine

## 2021-04-20 NOTE — Assessment & Plan Note (Addendum)
Continues to have dysarthria-but motor exam appears to be consistent with her stroke with weakness left significantly more than right.  Suspicion for PFO-and possible embolic stroke given that she has significant DVT in her lower extremities.  It is also possible that this could be thromboembolic stroke from large vessel disease.  See work-up as above.  Continue supportive care we will transition from heparin to Eliquis on 04/26/2021 if tolerates well then discharged to SNF.  Repeat CT scan done on 04/25/2021 is nonacute  Given advanced age/frailty-not a candidate for aggressive care including invasive vascular procedures.  Suspect best to manage medically.   Her LDL was above goal and she has been placed on statin for dyslipidemia, A1c was reasonable for her age, will place her on sliding scale insulin while she is here appears to be borderline DM type II with A1c of 6.8.

## 2021-04-20 NOTE — Progress Notes (Signed)
88 y/o Female Pt arrived to [facility] [id number]  from [facility]. Pt alert/confused, pleasant but resistive to repositioning. Admitting diagnosis of CVA, possible Sepsis.

## 2021-04-21 ENCOUNTER — Inpatient Hospital Stay (HOSPITAL_COMMUNITY): Payer: Medicare Other

## 2021-04-21 DIAGNOSIS — R627 Adult failure to thrive: Secondary | ICD-10-CM

## 2021-04-21 DIAGNOSIS — K219 Gastro-esophageal reflux disease without esophagitis: Secondary | ICD-10-CM

## 2021-04-21 DIAGNOSIS — Z7189 Other specified counseling: Secondary | ICD-10-CM

## 2021-04-21 DIAGNOSIS — I82413 Acute embolism and thrombosis of femoral vein, bilateral: Secondary | ICD-10-CM | POA: Diagnosis not present

## 2021-04-21 DIAGNOSIS — J90 Pleural effusion, not elsewhere classified: Secondary | ICD-10-CM | POA: Diagnosis not present

## 2021-04-21 DIAGNOSIS — I6389 Other cerebral infarction: Secondary | ICD-10-CM

## 2021-04-21 DIAGNOSIS — I63411 Cerebral infarction due to embolism of right middle cerebral artery: Secondary | ICD-10-CM | POA: Diagnosis not present

## 2021-04-21 DIAGNOSIS — Z515 Encounter for palliative care: Secondary | ICD-10-CM

## 2021-04-21 DIAGNOSIS — I639 Cerebral infarction, unspecified: Secondary | ICD-10-CM | POA: Diagnosis not present

## 2021-04-21 DIAGNOSIS — R55 Syncope and collapse: Secondary | ICD-10-CM | POA: Diagnosis not present

## 2021-04-21 DIAGNOSIS — G9341 Metabolic encephalopathy: Secondary | ICD-10-CM

## 2021-04-21 LAB — ECHOCARDIOGRAM COMPLETE
AR max vel: 1.03 cm2
AV Area VTI: 1.04 cm2
AV Area mean vel: 1.03 cm2
AV Mean grad: 8 mmHg
AV Peak grad: 14 mmHg
Ao pk vel: 1.87 m/s
Area-P 1/2: 4.36 cm2
MV VTI: 1.63 cm2
P 1/2 time: 421 msec
S' Lateral: 2.55 cm

## 2021-04-21 LAB — BASIC METABOLIC PANEL
Anion gap: 12 (ref 5–15)
BUN: 13 mg/dL (ref 8–23)
CO2: 18 mmol/L — ABNORMAL LOW (ref 22–32)
Calcium: 8.8 mg/dL — ABNORMAL LOW (ref 8.9–10.3)
Chloride: 102 mmol/L (ref 98–111)
Creatinine, Ser: 0.95 mg/dL (ref 0.44–1.00)
GFR, Estimated: 58 mL/min — ABNORMAL LOW (ref >60)
Glucose, Bld: 120 mg/dL — ABNORMAL HIGH (ref 70–99)
Potassium: 4 mmol/L (ref 3.5–5.1)
Sodium: 132 mmol/L — ABNORMAL LOW (ref 135–145)

## 2021-04-21 LAB — URINE CULTURE

## 2021-04-21 LAB — CBC WITH DIFFERENTIAL/PLATELET
Abs Immature Granulocytes: 0.2 10*3/uL — ABNORMAL HIGH (ref 0.00–0.07)
Basophils Absolute: 0.1 10*3/uL (ref 0.0–0.1)
Basophils Relative: 1 %
Eosinophils Absolute: 0.3 10*3/uL (ref 0.0–0.5)
Eosinophils Relative: 3 %
HCT: 34 % — ABNORMAL LOW (ref 36.0–46.0)
Hemoglobin: 10.7 g/dL — ABNORMAL LOW (ref 12.0–15.0)
Immature Granulocytes: 2 %
Lymphocytes Relative: 14 %
Lymphs Abs: 1.3 10*3/uL (ref 0.7–4.0)
MCH: 27.1 pg (ref 26.0–34.0)
MCHC: 31.5 g/dL (ref 30.0–36.0)
MCV: 86.1 fL (ref 80.0–100.0)
Monocytes Absolute: 0.7 10*3/uL (ref 0.1–1.0)
Monocytes Relative: 7 %
Neutro Abs: 6.7 10*3/uL (ref 1.7–7.7)
Neutrophils Relative %: 73 %
Platelets: 340 10*3/uL (ref 150–400)
RBC: 3.95 MIL/uL (ref 3.87–5.11)
RDW: 15.9 % — ABNORMAL HIGH (ref 11.5–15.5)
WBC: 9.1 10*3/uL (ref 4.0–10.5)
nRBC: 0 % (ref 0.0–0.2)

## 2021-04-21 LAB — GLUCOSE, CAPILLARY
Glucose-Capillary: 112 mg/dL — ABNORMAL HIGH (ref 70–99)
Glucose-Capillary: 119 mg/dL — ABNORMAL HIGH (ref 70–99)
Glucose-Capillary: 123 mg/dL — ABNORMAL HIGH (ref 70–99)
Glucose-Capillary: 135 mg/dL — ABNORMAL HIGH (ref 70–99)
Glucose-Capillary: 194 mg/dL — ABNORMAL HIGH (ref 70–99)
Glucose-Capillary: 212 mg/dL — ABNORMAL HIGH (ref 70–99)

## 2021-04-21 LAB — MAGNESIUM: Magnesium: 1.4 mg/dL — ABNORMAL LOW (ref 1.7–2.4)

## 2021-04-21 LAB — HEPARIN LEVEL (UNFRACTIONATED): Heparin Unfractionated: 0.72 IU/mL — ABNORMAL HIGH (ref 0.30–0.70)

## 2021-04-21 MED ORDER — VERAPAMIL HCL ER 120 MG PO TBCR
120.0000 mg | EXTENDED_RELEASE_TABLET | Freq: Every day | ORAL | Status: DC
  Administered 2021-04-21 – 2021-04-26 (×6): 120 mg via ORAL
  Filled 2021-04-21 (×6): qty 1

## 2021-04-21 MED ORDER — HYDRALAZINE HCL 50 MG PO TABS
50.0000 mg | ORAL_TABLET | Freq: Three times a day (TID) | ORAL | Status: DC
  Administered 2021-04-21 – 2021-04-27 (×17): 50 mg via ORAL
  Filled 2021-04-21 (×18): qty 1

## 2021-04-21 MED ORDER — ADULT MULTIVITAMIN W/MINERALS CH
1.0000 | ORAL_TABLET | Freq: Every day | ORAL | Status: DC
  Administered 2021-04-21 – 2021-04-27 (×7): 1 via ORAL
  Filled 2021-04-21 (×7): qty 1

## 2021-04-21 MED ORDER — ATORVASTATIN CALCIUM 80 MG PO TABS
80.0000 mg | ORAL_TABLET | Freq: Every day | ORAL | Status: DC
  Administered 2021-04-21: 11:00:00 80 mg via ORAL
  Filled 2021-04-21: qty 1

## 2021-04-21 MED ORDER — METOPROLOL TARTRATE 12.5 MG HALF TABLET
12.5000 mg | ORAL_TABLET | Freq: Two times a day (BID) | ORAL | Status: DC
  Administered 2021-04-21 – 2021-04-27 (×13): 12.5 mg via ORAL
  Filled 2021-04-21 (×13): qty 1

## 2021-04-21 MED ORDER — FUROSEMIDE 40 MG PO TABS
40.0000 mg | ORAL_TABLET | Freq: Every day | ORAL | Status: DC
  Administered 2021-04-21 – 2021-04-25 (×5): 40 mg via ORAL
  Filled 2021-04-21 (×5): qty 1

## 2021-04-21 MED ORDER — ATORVASTATIN CALCIUM 40 MG PO TABS
40.0000 mg | ORAL_TABLET | Freq: Every day | ORAL | Status: DC
  Administered 2021-04-22 – 2021-04-27 (×6): 40 mg via ORAL
  Filled 2021-04-21 (×6): qty 1

## 2021-04-21 MED ORDER — ENSURE ENLIVE PO LIQD
237.0000 mL | Freq: Two times a day (BID) | ORAL | Status: DC
  Administered 2021-04-22 – 2021-04-27 (×9): 237 mL via ORAL

## 2021-04-21 MED ORDER — ENOXAPARIN SODIUM 40 MG/0.4ML IJ SOSY
40.0000 mg | PREFILLED_SYRINGE | INTRAMUSCULAR | Status: DC
  Filled 2021-04-21: qty 0.4

## 2021-04-21 MED ORDER — HEPARIN (PORCINE) 25000 UT/250ML-% IV SOLN
900.0000 [IU]/h | INTRAVENOUS | Status: DC
  Administered 2021-04-21: 14:00:00 1000 [IU]/h via INTRAVENOUS
  Administered 2021-04-22: 20:00:00 800 [IU]/h via INTRAVENOUS
  Administered 2021-04-24: 850 [IU]/h via INTRAVENOUS
  Administered 2021-04-25: 900 [IU]/h via INTRAVENOUS
  Filled 2021-04-21 (×6): qty 250

## 2021-04-21 NOTE — Progress Notes (Addendum)
PROGRESS NOTE        PATIENT DETAILS Name: Lisa Villegas Age: 88 y.o. Sex: female Date of Birth: Nov 05, 1930 Admit Date: 04/19/2021 Admitting Physician Jonnie Finner, DO XMI:[id number], Mauro Kaufmann, MD  Brief Summary: 88 year old female with history of autism, prior CVA, HFpEF, CKD stage IIIa, DM-2, HTN, HLD-who presented with syncope/confusion-found to have acute MCA territory infarct.  See below for further details.  Significant events: 1/23>>  admit to [facility] for syncope/acute CVA 1/24>>.  Transferred to [facility]  Significant studies: 1/23>> CXR: Bilateral pleural effusion 1/23>> MRI brain: Moderate large acute posterior right MCA infarct.  Associated cytotoxic edema 1/24>> MRA head/neck: 70% stenosis left ICA, 60% proximal right ICA, moderate to severe stenosis right P2/P3, severe stenosis of distal left M1, proximal M2. 1/24>> A1c: 6.8 1/24>> LDL: 134 1/25>> Spot EEG: No seizures.  Microbiology data: 1/23>> blood culture: No growth  Subjective: Appears slightly dysarthric-no family at bedside-poor historian-.  Moving all 4 extremities-follows some commands.  Objective: Vitals: Blood pressure (!) 178/80, pulse 93, temperature 98.2 F (36.8 C), temperature source Oral, resp. rate 16, SpO2 96 %.   Exam: Gen Exam:not in any distress HEENT:atraumatic, normocephalic Chest: B/L clear to auscultation anteriorly CVS:S1S2 regular Abdomen:soft non tender, non distended Extremities:no edema Neurology: Generalized weakness-but appears to be nonfocal. Skin: no rash  Pertinent Labs/Radiology: CBC Latest Ref Rng & Units 04/21/2021 04/20/2021 04/19/2021  WBC 4.0 - 10.5 K/uL 9.1 8.1 -  Hemoglobin 12.0 - 15.0 g/dL 10.7(L) 8.7(L) 10.9(L)  Hematocrit 36.0 - 46.0 % 34.0(L) 27.9(L) 32.0(L)  Platelets 150 - 400 K/uL 340 280 -    Lab Results  Component Value Date   NA 132 (L) 04/21/2021   K 4.0 04/21/2021   CL 102 04/21/2021   CO2 18 (L) 04/21/2021       Assessment/Plan: * Acute posterior right MCA territory infarct- (present on admission) Difficult exam but appears to have some amount of dysarthria-she appears to have some amount of generalized weakness but does not appear to be focal.  Telemetry without A. fib (has a small burst of atrial tachycardia).  Stroke work-up as above.  Await further recommendations from stroke MD-however does not appear to be a good candidate for aggressive care including vascular surgery eval for carotid revascularization.  Continue antiplatelets per neurology-adding high intensity statin.  Await PT/OT eval.   Syncope- (present on admission) Probably vasovagal or orthostatic etiology-telemetry without any significant arrhythmias.  EEG without any seizures.  Echo pending.  Continue telemetry monitoring.  Check orthostatic vital signs.  Acute metabolic encephalopathy- (present on admission) Probably due to AKI/CVA-neuroimaging as above.  EEG negative.  Suspect she is back to baseline-no family at bedside.  Acute renal failure superimposed on stage 3a chronic kidney disease ([place])- (present on admission) AKI likely hemodynamically mediated-improved with supportive care.  Renal function has normalized.  Bilateral pleural effusion- (present on admission) Probably related to HFpEF-await echo.  She is asymptomatic-do not think this needs thoracocentesis at this point-as high likelihood this is a transudate.  We will place on diuresis and may be appropriate for outpatient follow-up with serial x-rays.   SIRS (systemic inflammatory response syndrome) ([place])- (present on admission) Noninfectious etiology-no signs of infection.  Being monitored off antimicrobial therapy.  Follow cultures.    Lactic acidosis- (present on admission) Suspect due to hypoperfusion/dehydration-no indication of infection at this point.  Doubt  any clinical significance at this point-has been adequately hydrated.  Hypertensive urgency- (present  on admission) Allow permissive hypertension-we will gradually lower BP over the next few days.  Type II diabetes mellitus with renal manifestations (A1c 6.8 on 1/24)- (present on admission) CBG stable with SSI.   Recent Labs    04/20/21 2007 04/20/21 2338 04/21/21 0335  GLUCAP 114* 123* 119*    Hyperlipidemia- (present on admission) Start high intensity statin.  Repeat lipid panel in 3 months.  GERD (gastroesophageal reflux disease)- (present on admission) Continue PPI.  FTT (failure to thrive) in adult- (present on admission) Prognosis is guarded in the setting of large stroke.  Appears frail at baseline-with numerous medical problems-do not think she is a good candidate for aggressive care.  Awaiting palliative care evaluation.   History of Hodgkin's disease Reported history.  Unable to verify.  Malnutrition of moderate degree- (present on admission) Continue supplements per dietitian.  Multiple closed fractures of metatarsal bone of right foot- (present on admission) Recent admission for the same.  Resume outpatient follow-up with orthopedics.   BMI: Estimated body mass index is 21.42 kg/m as calculated from the following:   Height as of 04/01/21: 5\' 5"  (1.651 m).   Weight as of 04/01/21: 58.4 kg.    DVT Prophylaxis: SQ Lovenox Procedures: None Consults: Neuro, palliative care Code Status:DNR Family Communication: Legal gaurdianMarylynn Villegas [phone] voicemail on 1/25.   Disposition Plan: Status is: Inpatient  Remains inpatient appropriate because: Acute CVA work up in progress    Diet: Diet Order             Diet regular Room service appropriate? Yes with Assist; Fluid consistency: Thin  Diet effective now                     Antimicrobial agents: Anti-infectives (From admission, onward)    Start     Dose/Rate Route Frequency Ordered Stop   04/21/21 1600  vancomycin (VANCOCIN) IVPB 1000 mg/200 mL premix  Status:  Discontinued         1,000 mg 200 mL/hr over 60 Minutes Intravenous Every 48 hours 04/19/21 1609 04/20/21 0823   04/19/21 1615  vancomycin (VANCOREADY) IVPB 1250 mg/250 mL        1,250 mg 166.7 mL/hr over 90 Minutes Intravenous  Once 04/19/21 1609 04/19/21 2004   04/19/21 1615  ceFEPIme (MAXIPIME) 2 g in sodium chloride 0.9 % 100 mL IVPB  Status:  Discontinued        2 g 200 mL/hr over 30 Minutes Intravenous Every 12 hours 04/19/21 1611 04/20/21 0823   04/19/21 1330  cefTRIAXone (ROCEPHIN) 2 g in sodium chloride 0.9 % 100 mL IVPB        2 g 200 mL/hr over 30 Minutes Intravenous  Once 04/19/21 1324 04/19/21 1432   04/19/21 1330  azithromycin (ZITHROMAX) 500 mg in sodium chloride 0.9 % 250 mL IVPB        500 mg 250 mL/hr over 60 Minutes Intravenous  Once 04/19/21 1324 04/19/21 1530        MEDICATIONS: Scheduled Meds:  aspirin  300 mg Rectal Daily   Or   aspirin  325 mg Oral Daily   atorvastatin  80 mg Oral Daily   enoxaparin (LOVENOX) injection  40 mg Subcutaneous Q24H   furosemide  40 mg Oral Daily   insulin aspart  0-9 Units Subcutaneous Q4H   oxybutynin  5 mg Oral BID   pantoprazole (PROTONIX) IV  40 mg  Intravenous Q24H   Continuous Infusions: PRN Meds:.   I have personally reviewed following labs and imaging studies  LABORATORY DATA: CBC: Recent Labs  Lab 04/19/21 1314 04/19/21 1321 04/20/21 0534 04/21/21 0421  WBC 11.3*  --  8.1 9.1  NEUTROABS 9.6*  --   --  6.7  HGB 10.2* 10.9* 8.7* 10.7*  HCT 33.2* 32.0* 27.9* 34.0*  MCV 90.0  --  89.4 86.1  PLT 344  --  280 542    Basic Metabolic Panel: Recent Labs  Lab 04/19/21 1314 04/19/21 1321 04/20/21 0534 04/21/21 0421  NA 137 136 134* 132*  K 4.6 4.6 4.5 4.0  CL 105 106 106 102  CO2 18*  --  21* 18*  GLUCOSE 145* 142* 127* 120*  BUN 29* 27* 24* 13  CREATININE 1.33* 1.30* 0.98 0.95  CALCIUM 8.5*  --  8.3* 8.8*  MG  --   --   --  1.4*    GFR: CrCl cannot be calculated (Unknown ideal weight.).  Liver Function  Tests: Recent Labs  Lab 04/19/21 1314 04/20/21 0534  AST 26 18  ALT 25 28  ALKPHOS 109 100  BILITOT 0.4 0.4  PROT 6.7 5.9*  ALBUMIN 3.0* 2.7*   No results for input(s): LIPASE, AMYLASE in the last 168 hours. No results for input(s): AMMONIA in the last 168 hours.  Coagulation Profile: Recent Labs  Lab 04/19/21 1311 04/20/21 0534  INR 1.1 1.1    Cardiac Enzymes: No results for input(s): CKTOTAL, CKMB, CKMBINDEX, TROPONINI in the last 168 hours.  BNP (last 3 results) No results for input(s): PROBNP in the last 8760 hours.  Lipid Profile: Recent Labs    04/20/21 0534  CHOL 211*  HDL 53  LDLCALC 134*  TRIG 119  CHOLHDL 4.0    Thyroid Function Tests: No results for input(s): TSH, T4TOTAL, FREET4, T3FREE, THYROIDAB in the last 72 hours.  Anemia Panel: No results for input(s): VITAMINB12, FOLATE, FERRITIN, TIBC, IRON, RETICCTPCT in the last 72 hours.  Urine analysis:    Component Value Date/Time   COLORURINE YELLOW 04/19/2021 1924   APPEARANCEUR HAZY (A) 04/19/2021 1924   APPEARANCEUR Clear 10/11/2017 1310   LABSPEC 1.025 04/19/2021 1924   PHURINE 5.0 04/19/2021 1924   GLUCOSEU NEGATIVE 04/19/2021 1924   HGBUR NEGATIVE 04/19/2021 1924   BILIRUBINUR NEGATIVE 04/19/2021 1924   BILIRUBINUR Negative 10/11/2017 1310   KETONESUR 5 (A) 04/19/2021 1924   PROTEINUR 100 (A) 04/19/2021 1924   UROBILINOGEN negative 03/14/2016 1227   NITRITE NEGATIVE 04/19/2021 1924   LEUKOCYTESUR SMALL (A) 04/19/2021 1924    Sepsis Labs: Lactic Acid, Venous    Component Value Date/Time   LATICACIDVEN 1.4 04/19/2021 2230    MICROBIOLOGY: Recent Results (from the past 240 hour(s))  Blood Culture (routine x 2)     Status: None (Preliminary result)   Collection Time: 04/19/21  1:07 PM   Specimen: Left Antecubital; Blood  Result Value Ref Range Status   Specimen Description   Final    LEFT ANTECUBITAL Performed at [facility], [place] [address]  [place], [place] [address]    Special Requests   Final    Blood Culture results may not be optimal due to an inadequate volume of blood received in culture bottles BOTTLES DRAWN AEROBIC AND ANAEROBIC Performed at [facility], [place] [address], [place], [place] [address]    Culture   Final    NO GROWTH 2 DAYS Performed at [facility],  [address], [place], [place] [address]    Report Status PENDING  Incomplete  Resp Panel by RT-PCR (Flu A&B, Covid) Nasopharyngeal Swab     Status: None   Collection Time: 04/19/21  2:08 PM   Specimen: Nasopharyngeal Swab; Nasopharyngeal(NP) swabs in vial transport medium  Result Value Ref Range Status   SARS Coronavirus 2 by RT PCR NEGATIVE NEGATIVE Final    Comment: (NOTE) SARS-CoV-2 target nucleic acids are NOT DETECTED.  The SARS-CoV-2 RNA is generally detectable in upper respiratory specimens during the acute phase of infection. The lowest concentration of SARS-CoV-2 viral copies this assay can detect is 138 copies/mL. A negative result does not preclude SARS-Cov-2 infection and should not be used as the sole basis for treatment or other patient management decisions. A negative result may occur with  improper specimen collection/handling, submission of specimen other than nasopharyngeal swab, presence of viral mutation(s) within the areas targeted by this assay, and inadequate number of viral copies(<138 copies/mL). A negative result must be combined with clinical observations, patient history, and epidemiological information. The expected result is Negative.  Fact Sheet for Patients:  EntrepreneurPulse.com.au  Fact Sheet for Healthcare Providers:  IncredibleEmployment.be  This test is no t yet approved or cleared by the [place] FDA and  has been authorized for detection and/or diagnosis of SARS-CoV-2 by FDA under an Emergency Use Authorization (EUA). This EUA will remain  in  effect (meaning this test can be used) for the duration of the COVID-19 declaration under Section 564(b)(1) of the Act, 21 U.S.C.section 360bbb-3(b)(1), unless the authorization is terminated  or revoked sooner.       Influenza A by PCR NEGATIVE NEGATIVE Final   Influenza B by PCR NEGATIVE NEGATIVE Final    Comment: (NOTE) The Xpert Xpress SARS-CoV-2/FLU/RSV plus assay is intended as an aid in the diagnosis of influenza from Nasopharyngeal swab specimens and should not be used as a sole basis for treatment. Nasal washings and aspirates are unacceptable for Xpert Xpress SARS-CoV-2/FLU/RSV testing.  Fact Sheet for Patients: EntrepreneurPulse.com.au  Fact Sheet for Healthcare Providers: IncredibleEmployment.be  This test is not yet approved or cleared by the [place] FDA and has been authorized for detection and/or diagnosis of SARS-CoV-2 by FDA under an Emergency Use Authorization (EUA). This EUA will remain in effect (meaning this test can be used) for the duration of the COVID-19 declaration under Section 564(b)(1) of the Act, 21 U.S.C. section 360bbb-3(b)(1), unless the authorization is terminated or revoked.  Performed at [facility], [place] [address], [place], [place] [address]   Urine Culture     Status: Abnormal   Collection Time: 04/19/21  7:24 PM   Specimen: In/Out Cath Urine  Result Value Ref Range Status   Specimen Description   Final    IN/OUT CATH URINE Performed at [place] [address] [place], [place] [address]    Special Requests   Final    NONE Performed at [facility], [place] [address] [place], [place] [address]    Culture MULTIPLE SPECIES PRESENT, SUGGEST RECOLLECTION (A)  Final   Report Status 04/21/2021 FINAL  Final  Blood Culture (routine x 2)     Status: None (Preliminary result)   Collection Time: 04/19/21 10:29 PM   Specimen: BLOOD  Result  Value Ref Range Status   Specimen Description   Final    BLOOD SITE NOT SPECIFIED Performed at [place] [address] [place], [place] [address]  Special Requests   Final    BOTTLES DRAWN AEROBIC AND ANAEROBIC Blood Culture adequate volume Performed at [place] [address] [place], [place] [address]    Culture   Final    NO GROWTH 1 DAY Performed at [facility], [place] [address], [place], [place] [address]    Report Status PENDING  Incomplete  MRSA Next Gen by PCR, Nasal     Status: None   Collection Time: 04/19/21 10:30 PM   Specimen: Peripheral; Nasal Swab  Result Value Ref Range Status   MRSA by PCR Next Gen NOT DETECTED NOT DETECTED Final    Comment: (NOTE) The GeneXpert MRSA Assay (FDA approved for NASAL specimens only), is one component of a comprehensive MRSA colonization surveillance program. It is not intended to diagnose MRSA infection nor to guide or monitor treatment for MRSA infections. Test performance is not FDA approved in patients less than 35 years old. Performed at [facility], [place] [address] [place], [place] [address]     RADIOLOGY STUDIES/RESULTS: CT Head Wo Contrast  Result Date: 04/19/2021 CLINICAL DATA:  Altered mental status EXAM: CT HEAD WITHOUT CONTRAST TECHNIQUE: Contiguous axial images were obtained from the base of the skull through the vertex without intravenous contrast. RADIATION DOSE REDUCTION: This exam was performed according to the departmental dose-optimization program which includes automated exposure control, adjustment of the mA and/or kV according to patient size and/or use of iterative reconstruction technique. 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 New no evidence of acute hemorrhage, hydrocephalus, extra-axial collection or mass lesion/mass effect. Vascular: No hyperdense vessel or  unexpected calcification. Skull: Normal. Negative for fracture or focal lesion. Sinuses/Orbits: Air-fluid level seen in the bilateral sphenoid sinuses. Other: None. IMPRESSION: 1. New acute/subacute appearing right temporoparietal infarct. 2. No evidence of intracranial hemorrhage. Critical Value/emergent results were called by telephone at the time of interpretation on 04/19/2021 at 12:47 pm to provider DAN FLOYD , who verbally acknowledged these results. Electronically Signed   By: Yetta Glassman M.D.   On: 04/19/2021 12:51   MR ANGIO HEAD WO CONTRAST  Result Date: 04/20/2021 CLINICAL DATA:  Stroke/TIA, determine embolic source EXAM: MRA NECK WITHOUT CONTRAST MRA HEAD WITHOUT CONTRAST TECHNIQUE: Angiographic images of the Circle of Willis were acquired using MRA technique without intravenous contrast. COMPARISON:  MRA 07/26/2017 trauma correlation is also made with MRI head 04/19/2021 FINDINGS: MRA NECK FINDINGS Two-vessel arch with a common origin of the brachiocephalic and left common carotid arteries. Imaged portion shows no evidence of aneurysm or dissection. No significant stenosis of the major arch vessel origins. Right common, internal, and external carotid arteries are patent, with approximately 60% narrowing of the right proximal ICA (series 4, image 54), likely secondary to calcified plaque. Left common carotid artery is patent. Poor evaluation of the bifurcation, likely secondary to the degree of calcified atherosclerosis. While this limits evaluation for stenosis, suspect approximately 70% luminal narrowing in the proximal left ICA. There is also likely severe stenosis at the origin of the left ECA. Extracranial vertebral arteries are patent, without hemodynamically significant stenosis. Possible mild narrowing of the proximal left V3 segment, although this is favored to be artifactual. MRA HEAD FINDINGS Both internal carotid arteries are patent to the termini, without stenosis or other  abnormality. A1 segments patent. Normal anterior communicating artery. Anterior cerebral arteries are patent to their distal aspects. Mild-to-moderate narrowing of the right M1 (series  2, image 106), which has progressed since 2019. Moderate narrowing of the mid left M1 and severe narrowing in the distal left M1 or proximal M2 (series 2, image 111), both of which are new from the prior exam. Normal MCA bifurcations. Mild loss of signal in a proximal right M2 branch (series 2, image 109), favored to represent stenosis and poor flow, given normal appearance on the prior exam. Distal MCA branches perfused and symmetric. Vertebral arteries patent to the vertebrobasilar junction without stenosis. Basilar patent to its distal aspect, with mild irregularity. Superior cerebellar arteries patent bilaterally. Bilateral P1 segments originate from the basilar artery. Focal loss of signal in the proximal right P2 segment (series 2, images 103-107), with additional moderate to severe stenosis in a right P3 (series 2, image 111), both of which are new from the prior exam. A more inferior right P3 branch is not well visualized, which appears unchanged. Mild-to-moderate narrowing in the left proximal P2 (series 2, image 102), which has progressed slightly from the prior exam. PCAs perfused to their distal aspects without stenosis. There are likely diminutive bilateral posterior communicating arteries. IMPRESSION: 1. Evaluation of the left carotid bifurcation is limited, likely secondary to the degree of calcification, although approximately 70% luminal narrowing is suspected in the proximal left ICA. There is also likely severe stenosis of the origin of the left ECA. Consider CTA for further evaluation if clinically indicated. 2. Approximately 60% narrowing of the proximal right ICA. 3. Focal loss of signal in the proximal right P2 segment, with additional moderate to severe stenosis in the right P3 segment, both of which are new  from the prior exam. 4. Poor opacification of a proximal right M2 branch, likely stenosis and poor flow, given normal appearance on the prior exam. 5. Severe stenosis in the distal left M1 or proximal left M2, with additional moderate narrowing of the bilateral M1 segments. Electronically Signed   By: Merilyn Baba M.D.   On: 04/20/2021 23:25   MR ANGIO NECK WO CONTRAST  Result Date: 04/20/2021 CLINICAL DATA:  Stroke/TIA, determine embolic source EXAM: MRA NECK WITHOUT CONTRAST MRA HEAD WITHOUT CONTRAST TECHNIQUE: Angiographic images of the Circle of Willis were acquired using MRA technique without intravenous contrast. COMPARISON:  MRA 07/26/2017 trauma correlation is also made with MRI head 04/19/2021 FINDINGS: MRA NECK FINDINGS Two-vessel arch with a common origin of the brachiocephalic and left common carotid arteries. Imaged portion shows no evidence of aneurysm or dissection. No significant stenosis of the major arch vessel origins. Right common, internal, and external carotid arteries are patent, with approximately 60% narrowing of the right proximal ICA (series 4, image 54), likely secondary to calcified plaque. Left common carotid artery is patent. Poor evaluation of the bifurcation, likely secondary to the degree of calcified atherosclerosis. While this limits evaluation for stenosis, suspect approximately 70% luminal narrowing in the proximal left ICA. There is also likely severe stenosis at the origin of the left ECA. Extracranial vertebral arteries are patent, without hemodynamically significant stenosis. Possible mild narrowing of the proximal left V3 segment, although this is favored to be artifactual. MRA HEAD FINDINGS Both internal carotid arteries are patent to the termini, without stenosis or other abnormality. A1 segments patent. Normal anterior communicating artery. Anterior cerebral arteries are patent to their distal aspects. Mild-to-moderate narrowing of the right M1 (series 2, image  106), which has progressed since 2019. Moderate narrowing of the mid left M1 and severe narrowing in the distal left M1 or proximal M2 (series 2,  image 111), both of which are new from the prior exam. Normal MCA bifurcations. Mild loss of signal in a proximal right M2 branch (series 2, image 109), favored to represent stenosis and poor flow, given normal appearance on the prior exam. Distal MCA branches perfused and symmetric. Vertebral arteries patent to the vertebrobasilar junction without stenosis. Basilar patent to its distal aspect, with mild irregularity. Superior cerebellar arteries patent bilaterally. Bilateral P1 segments originate from the basilar artery. Focal loss of signal in the proximal right P2 segment (series 2, images 103-107), with additional moderate to severe stenosis in a right P3 (series 2, image 111), both of which are new from the prior exam. A more inferior right P3 branch is not well visualized, which appears unchanged. Mild-to-moderate narrowing in the left proximal P2 (series 2, image 102), which has progressed slightly from the prior exam. PCAs perfused to their distal aspects without stenosis. There are likely diminutive bilateral posterior communicating arteries. IMPRESSION: 1. Evaluation of the left carotid bifurcation is limited, likely secondary to the degree of calcification, although approximately 70% luminal narrowing is suspected in the proximal left ICA. There is also likely severe stenosis of the origin of the left ECA. Consider CTA for further evaluation if clinically indicated. 2. Approximately 60% narrowing of the proximal right ICA. 3. Focal loss of signal in the proximal right P2 segment, with additional moderate to severe stenosis in the right P3 segment, both of which are new from the prior exam. 4. Poor opacification of a proximal right M2 branch, likely stenosis and poor flow, given normal appearance on the prior exam. 5. Severe stenosis in the distal left M1 or  proximal left M2, with additional moderate narrowing of the bilateral M1 segments. Electronically Signed   By: Merilyn Baba M.D.   On: 04/20/2021 23:25   MR BRAIN WO CONTRAST  Result Date: 04/19/2021 CLINICAL DATA:  Neuro deficit, acute, stroke suspected. EXAM: MRI HEAD WITHOUT CONTRAST TECHNIQUE: Multiplanar, multiecho pulse sequences of the brain and surrounding structures were obtained without intravenous contrast. COMPARISON:  Head CT 04/19/2021 and MRI 07/26/2017 FINDINGS: Brain: There is a moderately large acute to early subacute posterior MCA infarct involving the temporal lobe, lateral aspect of the occipital lobe, and insula. A small acute infarct is also noted along the body of the right caudate nucleus. There is associated cytotoxic edema with sulcal effacement and slight mass effect on the temporal horn of the right lateral ventricle. No midline shift or extra-axial fluid collection is evident. A small focus of chronic hemorrhage/cavernoma in the left occipital lobe is unchanged from the prior MRI. There is a small chronic left MCA infarct involving the frontal operculum and insula which is new from the prior MRI, as is a chronic lacunar infarct in the right thalamus. A small chronic right cerebellar infarct is unchanged. T2 hyperintensities elsewhere in the cerebral white matter bilaterally are stable to slightly increased from the prior MRI and are nonspecific but compatible with mild chronic small vessel ischemic disease. There is mild cerebral atrophy. Vascular: Major intracranial vascular flow voids are preserved. Skull and upper cervical spine: Unremarkable bone marrow signal. Sinuses/Orbits: Bilateral cataract extraction. Small volume fluid in the sphenoid sinuses. Trace left mastoid fluid. Other: None. IMPRESSION: 1. Moderately large acute to early subacute posterior right MCA infarct. 2. Small chronic infarcts as above. Electronically Signed   By: Logan Bores M.D.   On: 04/19/2021 17:13    DG CHEST PORT 1 VIEW  Result Date: 04/21/2021 CLINICAL DATA:  Bilateral pleural effusions. EXAM: PORTABLE CHEST 1 VIEW COMPARISON:  Chest x-ray 04/19/2021 FINDINGS: The heart is within normal limits in size and stable. Mild tortuosity and calcification of the thoracic aorta. Deviation of the trachea leftward due to right thyroid goiter and scoliosis. Persistent small bilateral pleural effusions, left larger than right with overlying bibasilar atelectasis. Mild central vascular congestion but no overt pulmonary edema. IMPRESSION: 1. Persistent small bilateral pleural effusions and overlying atelectasis. 2. Mild central vascular congestion without overt pulmonary edema. Electronically Signed   By: Marijo Sanes M.D.   On: 04/21/2021 08:23   DG Chest Port 1 View  Result Date: 04/19/2021 CLINICAL DATA:  Syncopal episode.  Question sepsis. EXAM: PORTABLE CHEST 1 VIEW COMPARISON:  03/30/2021 FINDINGS: Worsened appearance since the study of 20 days ago. Borderline cardiomegaly and aortic atherosclerosis are again noted. There are developing bilateral pleural effusions which are moderate in size and associated with bilateral lower lobe volume loss. Lower lobe pneumonia not excluded. Upper lungs remain clear. IMPRESSION: Development of moderate bilateral pleural effusions with lower lobe atelectasis. Pneumonia not excluded. Electronically Signed   By: Nelson Chimes M.D.   On: 04/19/2021 12:48   EEG adult  Result Date: 04/21/2021 Lora Havens, MD     04/21/2021  8:22 AM Patient Name: Lisa Villegas MRN: [id number] Epilepsy Attending: Lora Havens Referring Physician/Provider: Lavina Hamman, MD Date: 04/20/2021 Duration: 25.58 mins Patient history: 88 year old female with syncope.  EEG to evaluate for seizures. Level of alertness: Awake AEDs during EEG study: None Technical aspects: This EEG study was done with scalp electrodes positioned according to the 10-20 International system of electrode  placement. Electrical activity was acquired at a sampling rate of 500Hz  and reviewed with a high frequency filter of 70Hz  and a low frequency filter of 1Hz . EEG data were recorded continuously and digitally stored. Description: The posterior dominant rhythm consists of 8 Hz activity of moderate voltage (25-35 uV) seen predominantly in posterior head regions, symmetric and reactive to eye opening and eye closing.  EEG showed continuous 3 to 5 Hz theta -delta slowing in her right frontotemporal region.  Hyperventilation and photic stimulation were not performed.   ABNORMALITY -Continuous slow, right frontotemporal region IMPRESSION: This study is suggestive of cortical dysfunction in right frontotemporal region likely secondary to underlying stroke.  No seizures or epileptiform discharges were seen throughout the recording. Priyanka Barbra Sarks     LOS: 2 days   Oren Binet, MD  [facility]    To contact the attending provider between 7A-7P or the covering provider during after hours 7P-7A, please log into the web site [email or web address] and access using universal [place] password for that web site. If you do not have the password, please call the hospital operator.  04/21/2021, 11:07 AM

## 2021-04-21 NOTE — Progress Notes (Addendum)
STROKE TEAM PROGRESS NOTE   ATTENDING NOTE: I reviewed above note and agree with the assessment and plan. Pt was seen and examined.   88 year old female with history of hypertension, hyperlipidemia, diabetes, CHF, CKD admitted for lethargy, whole body weakness and collapsed at home with unresponsiveness, hypoxia and hypotension.  Baseline mRS = 4.  CT showed right temporal occipital infarct.  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  MRA head and neck showed left proximal ICA 70%, right proximal ICA 60% stenosis.  Right P3, M2 and left M1 stenosis.  Carotid Doppler right ICA 40 to 59% stenosis.  2D echo pending.  LE venous Doppler showed right lower extremity extensive age indeterminant DVT in the left lower extremity acute distal DVT.  LDL 134, A1c 6.8.  Creatinine 0.98.  Hemoglobin 8.7.  BP elevated on presentation.  On exam, no family at bedside, patient awake, alert, eyes open, orientated to self and age, but not to time or place. Able to say her name, age, "OK", count fingers, but most answer fixed on "No" with perseveration. Only open and close eyes on command, but not following other simple commands. Able to name 2/4 but not cooperative with repeating. No gaze palsy, tracking bilaterally, blinking to visual threat bilaterally.  Mild left nasolabial fold flattening.. Tongue protrusion not corporative.  Bilateral UEs 3/5, no drift. Bilaterally LEs 3-/5, symmetrical. Sensation and coordination not corporative, gait not tested.   Etiology for patient stroke not quite clear, could be cardioembolic given DVT versus large vessel disease given ICA stenosis.  With extensive bilateral DVT, will recommend anticoagulation.  Patient has large right temporal infarct, will do heparin IV per stroke protocol, once tolerating in 42 to 72 hours, may consider switch to Eliquis p.o. patient BP is still on higher side, will resume home BP meds since patient on heparin IV.  No further cardioembolic work-up needed at this  time given on anticoagulation.  Also put on Lipitor 40 for high LDL.  PT/OT pending.  We will follow  For detailed assessment and plan, please refer to above as I have made changes wherever appropriate.   Rosalin Hawking, MD PhD Stroke Neurology 04/21/2021 2:46 PM  I discussed with Dr. Sloan Leiter. I spent  extensive time in counseling and coordination of care, reviewing test results, images and medication, and discussing the diagnosis, treatment plan and potential prognosis. This patient's care requiresreview of multiple databases, neurological assessment, discussion with family, other specialists and medical decision making of high complexity.      INTERVAL HISTORY Patient is seen in her room with no family at the bedside.  She has been hypertensive overnight.  Her lower extremity ultrasound revealed a DVT, and she  will be anticoagulated with heparin.  Vitals:   04/21/21 0649 04/21/21 0725 04/21/21 0800 04/21/21 1149  BP:  (!) 178/80  (!) 185/85  Pulse:  93  96  Resp:  16  16  Temp:  98.2 F (36.8 C)  98.4 F (36.9 C)  TempSrc:  Oral  Oral  SpO2: 95% 96% 96% 95%   CBC:  Recent Labs  Lab 04/19/21 1314 04/19/21 1321 04/20/21 0534 04/21/21 0421  WBC 11.3*  --  8.1 9.1  NEUTROABS 9.6*  --   --  6.7  HGB 10.2*   < > 8.7* 10.7*  HCT 33.2*   < > 27.9* 34.0*  MCV 90.0  --  89.4 86.1  PLT 344  --  280 340   < > = values in this interval  not displayed.   Basic Metabolic Panel:  Recent Labs  Lab 04/20/21 0534 04/21/21 0421  NA 134* 132*  K 4.5 4.0  CL 106 102  CO2 21* 18*  GLUCOSE 127* 120*  BUN 24* 13  CREATININE 0.98 0.95  CALCIUM 8.3* 8.8*  MG  --  1.4*   Lipid Panel:  Recent Labs  Lab 04/20/21 0534  CHOL 211*  TRIG 119  HDL 53  CHOLHDL 4.0  VLDL 24  LDLCALC 134*   HgbA1c:  Recent Labs  Lab 04/20/21 0534  HGBA1C 6.8*   Urine Drug Screen: No results for input(s): LABOPIA, COCAINSCRNUR, LABBENZ, AMPHETMU, THCU, LABBARB in the last 168 hours.  Alcohol Level  No results for input(s): ETH in the last 168 hours.  IMAGING past 24 hours MR ANGIO HEAD WO CONTRAST  Result Date: 04/20/2021 CLINICAL DATA:  Stroke/TIA, determine embolic source EXAM: MRA NECK WITHOUT CONTRAST MRA HEAD WITHOUT CONTRAST TECHNIQUE: Angiographic images of the Circle of Willis were acquired using MRA technique without intravenous contrast. COMPARISON:  MRA 07/26/2017 trauma correlation is also made with MRI head 04/19/2021 FINDINGS: MRA NECK FINDINGS Two-vessel arch with a common origin of the brachiocephalic and left common carotid arteries. Imaged portion shows no evidence of aneurysm or dissection. No significant stenosis of the major arch vessel origins. Right common, internal, and external carotid arteries are patent, with approximately 60% narrowing of the right proximal ICA (series 4, image 54), likely secondary to calcified plaque. Left common carotid artery is patent. Poor evaluation of the bifurcation, likely secondary to the degree of calcified atherosclerosis. While this limits evaluation for stenosis, suspect approximately 70% luminal narrowing in the proximal left ICA. There is also likely severe stenosis at the origin of the left ECA. Extracranial vertebral arteries are patent, without hemodynamically significant stenosis. Possible mild narrowing of the proximal left V3 segment, although this is favored to be artifactual. MRA HEAD FINDINGS Both internal carotid arteries are patent to the termini, without stenosis or other abnormality. A1 segments patent. Normal anterior communicating artery. Anterior cerebral arteries are patent to their distal aspects. Mild-to-moderate narrowing of the right M1 (series 2, image 106), which has progressed since 2019. Moderate narrowing of the mid left M1 and severe narrowing in the distal left M1 or proximal M2 (series 2, image 111), both of which are new from the prior exam. Normal MCA bifurcations. Mild loss of signal in a proximal right M2 branch  (series 2, image 109), favored to represent stenosis and poor flow, given normal appearance on the prior exam. Distal MCA branches perfused and symmetric. Vertebral arteries patent to the vertebrobasilar junction without stenosis. Basilar patent to its distal aspect, with mild irregularity. Superior cerebellar arteries patent bilaterally. Bilateral P1 segments originate from the basilar artery. Focal loss of signal in the proximal right P2 segment (series 2, images 103-107), with additional moderate to severe stenosis in a right P3 (series 2, image 111), both of which are new from the prior exam. A more inferior right P3 branch is not well visualized, which appears unchanged. Mild-to-moderate narrowing in the left proximal P2 (series 2, image 102), which has progressed slightly from the prior exam. PCAs perfused to their distal aspects without stenosis. There are likely diminutive bilateral posterior communicating arteries. IMPRESSION: 1. Evaluation of the left carotid bifurcation is limited, likely secondary to the degree of calcification, although approximately 70% luminal narrowing is suspected in the proximal left ICA. There is also likely severe stenosis of the origin of the left ECA.  Consider CTA for further evaluation if clinically indicated. 2. Approximately 60% narrowing of the proximal right ICA. 3. Focal loss of signal in the proximal right P2 segment, with additional moderate to severe stenosis in the right P3 segment, both of which are new from the prior exam. 4. Poor opacification of a proximal right M2 branch, likely stenosis and poor flow, given normal appearance on the prior exam. 5. Severe stenosis in the distal left M1 or proximal left M2, with additional moderate narrowing of the bilateral M1 segments. Electronically Signed   By: Merilyn Baba M.D.   On: 04/20/2021 23:25   MR ANGIO NECK WO CONTRAST  Result Date: 04/20/2021 CLINICAL DATA:  Stroke/TIA, determine embolic source EXAM: MRA NECK  WITHOUT CONTRAST MRA HEAD WITHOUT CONTRAST TECHNIQUE: Angiographic images of the Circle of Willis were acquired using MRA technique without intravenous contrast. COMPARISON:  MRA 07/26/2017 trauma correlation is also made with MRI head 04/19/2021 FINDINGS: MRA NECK FINDINGS Two-vessel arch with a common origin of the brachiocephalic and left common carotid arteries. Imaged portion shows no evidence of aneurysm or dissection. No significant stenosis of the major arch vessel origins. Right common, internal, and external carotid arteries are patent, with approximately 60% narrowing of the right proximal ICA (series 4, image 54), likely secondary to calcified plaque. Left common carotid artery is patent. Poor evaluation of the bifurcation, likely secondary to the degree of calcified atherosclerosis. While this limits evaluation for stenosis, suspect approximately 70% luminal narrowing in the proximal left ICA. There is also likely severe stenosis at the origin of the left ECA. Extracranial vertebral arteries are patent, without hemodynamically significant stenosis. Possible mild narrowing of the proximal left V3 segment, although this is favored to be artifactual. MRA HEAD FINDINGS Both internal carotid arteries are patent to the termini, without stenosis or other abnormality. A1 segments patent. Normal anterior communicating artery. Anterior cerebral arteries are patent to their distal aspects. Mild-to-moderate narrowing of the right M1 (series 2, image 106), which has progressed since 2019. Moderate narrowing of the mid left M1 and severe narrowing in the distal left M1 or proximal M2 (series 2, image 111), both of which are new from the prior exam. Normal MCA bifurcations. Mild loss of signal in a proximal right M2 branch (series 2, image 109), favored to represent stenosis and poor flow, given normal appearance on the prior exam. Distal MCA branches perfused and symmetric. Vertebral arteries patent to the  vertebrobasilar junction without stenosis. Basilar patent to its distal aspect, with mild irregularity. Superior cerebellar arteries patent bilaterally. Bilateral P1 segments originate from the basilar artery. Focal loss of signal in the proximal right P2 segment (series 2, images 103-107), with additional moderate to severe stenosis in a right P3 (series 2, image 111), both of which are new from the prior exam. A more inferior right P3 branch is not well visualized, which appears unchanged. Mild-to-moderate narrowing in the left proximal P2 (series 2, image 102), which has progressed slightly from the prior exam. PCAs perfused to their distal aspects without stenosis. There are likely diminutive bilateral posterior communicating arteries. IMPRESSION: 1. Evaluation of the left carotid bifurcation is limited, likely secondary to the degree of calcification, although approximately 70% luminal narrowing is suspected in the proximal left ICA. There is also likely severe stenosis of the origin of the left ECA. Consider CTA for further evaluation if clinically indicated. 2. Approximately 60% narrowing of the proximal right ICA. 3. Focal loss of signal in the proximal right P2 segment,  with additional moderate to severe stenosis in the right P3 segment, both of which are new from the prior exam. 4. Poor opacification of a proximal right M2 branch, likely stenosis and poor flow, given normal appearance on the prior exam. 5. Severe stenosis in the distal left M1 or proximal left M2, with additional moderate narrowing of the bilateral M1 segments. Electronically Signed   By: Merilyn Baba M.D.   On: 04/20/2021 23:25   DG CHEST PORT 1 VIEW  Result Date: 04/21/2021 CLINICAL DATA:  Bilateral pleural effusions. EXAM: PORTABLE CHEST 1 VIEW COMPARISON:  Chest x-ray 04/19/2021 FINDINGS: The heart is within normal limits in size and stable. Mild tortuosity and calcification of the thoracic aorta. Deviation of the trachea leftward  due to right thyroid goiter and scoliosis. Persistent small bilateral pleural effusions, left larger than right with overlying bibasilar atelectasis. Mild central vascular congestion but no overt pulmonary edema. IMPRESSION: 1. Persistent small bilateral pleural effusions and overlying atelectasis. 2. Mild central vascular congestion without overt pulmonary edema. Electronically Signed   By: Marijo Sanes M.D.   On: 04/21/2021 08:23   EEG adult  Result Date: 04/21/2021 Lora Havens, MD     04/21/2021  8:22 AM Patient Name: Lee-Anne Flicker MRN: [id number] Epilepsy Attending: Lora Havens Referring Physician/Provider: Lavina Hamman, MD Date: 04/20/2021 Duration: 25.58 mins Patient history: 88 year old female with syncope.  EEG to evaluate for seizures. Level of alertness: Awake AEDs during EEG study: None Technical aspects: This EEG study was done with scalp electrodes positioned according to the 10-20 International system of electrode placement. Electrical activity was acquired at a sampling rate of 500Hz  and reviewed with a high frequency filter of 70Hz  and a low frequency filter of 1Hz . EEG data were recorded continuously and digitally stored. Description: The posterior dominant rhythm consists of 8 Hz activity of moderate voltage (25-35 uV) seen predominantly in posterior head regions, symmetric and reactive to eye opening and eye closing.  EEG showed continuous 3 to 5 Hz theta -delta slowing in her right frontotemporal region.  Hyperventilation and photic stimulation were not performed.   ABNORMALITY -Continuous slow, right frontotemporal region IMPRESSION: This study is suggestive of cortical dysfunction in right frontotemporal region likely secondary to underlying stroke.  No seizures or epileptiform discharges were seen throughout the recording. Priyanka O Yadav   VAS US CAROTID  Result Date: 04/21/2021 Carotid Arterial Duplex Study Patient Name:  KARON COTTERILL  Date of Exam:    04/21/2021 Medical Rec #: [id number]           Accession #:    [id number] Date of Birth: 05/28/1930           Patient Gender: F Patient Age:   66 years Exam Location:  [facility] Procedure:      VAS US CAROTID Referring Phys: Oren Binet --------------------------------------------------------------------------------  Indications:       CVA. Risk Factors:      Hypertension, hyperlipidemia, Diabetes. Limitations        Today's exam was limited due to the patient's inability or                    unwillingness to cooperate. Comparison Study:  07/27/2017 Carotid artery duplex- bilateral 1-39% ICA stenosis. Performing Technologist: Maudry Mayhew MHA, RDMS, RVT, RDCS  Examination Guidelines: A complete evaluation includes B-mode imaging, spectral Doppler, color Doppler, and power Doppler as needed of all accessible portions of each vessel. Bilateral testing is considered an integral  part of a complete examination. Limited examinations for reoccurring indications may be performed as noted.  Right Carotid Findings: +----------+--------+--------+--------+---------------------+------------------+             PSV cm/s EDV cm/s Stenosis Plaque Description    Comments            +----------+--------+--------+--------+---------------------+------------------+  CCA Prox   58       9                 heterogenous,                                                                    irregular and                                                                    calcific                                  +----------+--------+--------+--------+---------------------+------------------+  CCA Distal 42       7                 heterogenous, smooth                                                             and calcific                              +----------+--------+--------+--------+---------------------+------------------+  ICA Prox   153      49       40-59%   heterogenous,                                                                     irregular and                                                                    calcific                                  +----------+--------+--------+--------+---------------------+------------------+  ICA Mid  tortuous, unable                                                                 to Doppler          +----------+--------+--------+--------+---------------------+------------------+  ICA Distal 68       18                                      tortuous            +----------+--------+--------+--------+---------------------+------------------+  ECA        83       22                heterogenous and                                                                 calcific                                  +----------+--------+--------+--------+---------------------+------------------+ +----------+--------+-------+----------------+-------------------+             PSV cm/s EDV cms Describe         Arm Pressure (mmHG)  +----------+--------+-------+----------------+-------------------+  Subclavian 136              Multiphasic, WNL                      +----------+--------+-------+----------------+-------------------+ +---------+--------+--+--------+--+---------+  Vertebral PSV cm/s 89 EDV cm/s 24 Antegrade  +---------+--------+--+--------+--+---------+  Left Carotid Findings: +----------+--------+-------+--------+--------------------------------+--------+             PSV cm/s EDV     Stenosis Plaque Description               Comments                       cm/s                                                        +----------+--------+-------+--------+--------------------------------+--------+  CCA Prox   74       14               smooth and heterogenous                    +----------+--------+-------+--------+--------------------------------+--------+  CCA Distal 63       10               heterogenous and irregular                  +----------+--------+-------+--------+--------------------------------+--------+  ICA Prox   69       17      1-39%    smooth, heterogenous and  calcific                                   +----------+--------+-------+--------+--------------------------------+--------+  ICA Distal 100      16                                                          +----------+--------+-------+--------+--------------------------------+--------+  ECA        94       9                                                           +----------+--------+-------+--------+--------------------------------+--------+ +----------+--------+--------+----------------+-------------------+             PSV cm/s EDV cm/s Describe         Arm Pressure (mmHG)  +----------+--------+--------+----------------+-------------------+  Subclavian 148               Multiphasic, WNL                      +----------+--------+--------+----------------+-------------------+ +---------+--------+--+--------+--+---------+  Vertebral PSV cm/s 62 EDV cm/s 14 Antegrade  +---------+--------+--+--------+--+---------+   Summary: Right Carotid: Velocities in the right ICA are consistent with a 40-59%                stenosis. Left Carotid: Velocities in the left ICA are consistent with a 1-39% stenosis. Vertebrals:  Bilateral vertebral arteries demonstrate antegrade flow. Subclavians: Normal flow hemodynamics were seen in bilateral subclavian              arteries. *See table(s) above for measurements and observations.     Preliminary    VAS [place] LOWER EXTREMITY VENOUS (DVT)  Result Date: 04/21/2021  Lower Venous DVT Study Patient Name:  PERRI ARAGONES  Date of Exam:   04/21/2021 Medical Rec #: [id number]           Accession #:    [id number] Date of Birth: 18-Apr-1930           Patient Gender: F Patient Age:   89 years Exam Location:  [facility] Procedure:      VAS [place] LOWER EXTREMITY VENOUS (DVT) Referring Phys:  Cornelius Moras Treavon Castilleja --------------------------------------------------------------------------------  Indications: Stroke.  Comparison Study: No prior study Performing Technologist: Maudry Mayhew MHA, RDMS, RVT, RDCS  Examination Guidelines: A complete evaluation includes B-mode imaging, spectral Doppler, color Doppler, and power Doppler as needed of all accessible portions of each vessel. Bilateral testing is considered an integral part of a complete examination. Limited examinations for reoccurring indications may be performed as noted. The reflux portion of the exam is performed with the patient in reverse Trendelenburg.  +---------+---------------+---------+-----------+----------+-----------------+  RIGHT     Compressibility Phasicity Spontaneity Properties Thrombus Aging     +---------+---------------+---------+-----------+----------+-----------------+  CFV       None                      No                     Age Indeterminate  +---------+---------------+---------+-----------+----------+-----------------+  FV Prox   None                                             Age Indeterminate  +---------+---------------+---------+-----------+----------+-----------------+  FV Mid    None                                             Age Indeterminate  +---------+---------------+---------+-----------+----------+-----------------+  FV Distal None                                             Age Indeterminate  +---------+---------------+---------+-----------+----------+-----------------+  PFV       None                                             Age Indeterminate  +---------+---------------+---------+-----------+----------+-----------------+  POP       None                      No                     Age Indeterminate  +---------+---------------+---------+-----------+----------+-----------------+  PTV       None                      No                     Age Indeterminate   +---------+---------------+---------+-----------+----------+-----------------+  PERO      None                      No                     Age Indeterminate  +---------+---------------+---------+-----------+----------+-----------------+  EIV                       No        Yes                                       +---------+---------------+---------+-----------+----------+-----------------+   Right Technical Findings: Not visualized segments include Right common iliac vein, IVC.  +---------+---------------+---------+-----------+----------+--------------+  LEFT      Compressibility Phasicity Spontaneity Properties Thrombus Aging  +---------+---------------+---------+-----------+----------+--------------+  CFV       Full            No        Yes                                    +---------+---------------+---------+-----------+----------+--------------+  SFJ       Full                                                             +---------+---------------+---------+-----------+----------+--------------+  FV Prox   Full                                                             +---------+---------------+---------+-----------+----------+--------------+  FV Mid    Full                                                             +---------+---------------+---------+-----------+----------+--------------+  FV Distal Full                                                             +---------+---------------+---------+-----------+----------+--------------+  PFV       Full                                                             +---------+---------------+---------+-----------+----------+--------------+  POP       Full            No        Yes                                    +---------+---------------+---------+-----------+----------+--------------+  PTV       None                                             Acute           +---------+---------------+---------+-----------+----------+--------------+  PERO      None                                              Acute           +---------+---------------+---------+-----------+----------+--------------+    Summary: RIGHT: - Findings consistent with age indeterminate deep vein thrombosis involving the right common femoral vein, right femoral vein, right proximal profunda vein, right popliteal vein, right posterior tibial veins, and right peroneal veins. - No cystic structure found in the popliteal fossa. - Right external iliac vein is patent with pulsatile flow, suggestive of possibly elevated right heart pressure.  LEFT: - Findings consistent with acute deep vein thrombosis involving the left posterior tibial veins, and left peroneal veins. - No cystic structure found in the popliteal fossa. - Pulsatile flow noted, suggestive of possibly elevated right heart pressure.  *See table(s) above for measurements and observations.    Preliminary     PHYSICAL EXAM General:  Alert, thin appearing female in no acute distress.  Swelling and bruising  noted to right foot   NEURO:  Mental Status: AA&Ox1 Speech/Language: speech is without dysarthria or aphasia but slow to respond  Cranial Nerves:  II: PERRL.   III, IV, VI: EOMI. Eyelids elevate symmetrically.  VII: Smile is symmetrical. VIII: hearing intact to voice. IX, X: Phonation is normal.  XII: tongue is midline without fasciculations. Motor: 5/5 strength to all muscle groups tested.  Tone: is normal and bulk is normal Sensation- Intact to light touch bilaterally. Gait- deferred   ASSESSMENT/PLAN Ms. Gionna Polak is a 88 y.o. female with history of HTN, dCHF, T2DM, HLD and CKD presenting with hypoxia and acute weakness. Patient was in her ALF and was walking with PT when she had acute onset of weakness and collapsed.  SPO2 at that time was 80% and rose with administration of supplemental o2.  She was then taken to the ED and found to have a large right MCA stroke.  Stroke:  right MCA infarct of temporpartietal  area, etiology unclear, but  likely secondary cardioembolic source vs. Large vessel disease  Code Stroke CT head New acute/subacute right temporoparietal infarct   MRI  moderately large acute to early subacute posterior right MCA infarct MRA  70% narrowing of left proximal ICA, 60% narrowing of right proximal ICA, stenosis of right P3 segment, poor opacification of proximal right M2 branch, severe stenosis in distal left M1 Carotid Doppler  right ICA 40-59% stenosis, left ICA 1-39% stenosis 2D Echo pending Lower extremity ultrasound DVT in right common femoral vein, proximal profunda vein, popliteal vein , posterior tibial and peroneal veins, acute DVT in left posterior tibial and peroneal veins LDL 134 HgbA1c 6.8 VTE prophylaxis - fully anticoagulated with heparin aspirin 81 mg daily prior to admission, now on heparin IV. If tolerating well, will switch to eliquis in 2-3 days. Therapy recommendations:  pending Disposition:  pending  Hypertension Home meds:  metoprolol 12.5 mg BID Stable Long-term BP goal normotensive  Hyperlipidemia Home meds:  none LDL 134, goal < 70 Add atorvastatin 40 mg daily Continue statin at discharge  Diabetes type II Controlled Home meds:  insulin glargine 12 units SQ daily, metformin 1000 mg Q AM and 500 mg QHS HgbA1c 6.8, goal < 7.0 CBGs SSI  DVT Lower extremity ultrasound shows bilateral DVTs Anticoagulate with heparin  Change to PO anticoagulation in a few days.  Other Stroke Risk Factors Advanced Age >/= 5  Congestive heart failure  Other Active Problems CKD Avoid contrast when possible Renally dose medications as appropriate  Hospital day # [place] , MSN, AGACNP-BC [facility] See Amion for schedule and pager information 04/21/2021 1:41 PM    To contact Stroke Continuity provider, please refer to [email or web address] After hours, contact General Neurology

## 2021-04-21 NOTE — Progress Notes (Signed)
Bilateral lower extremity venous duplex and carotid artery duplex completed. Refer to "CV Proc" under chart review to view preliminary results.  Preliminary results discussed with Dr. Erlinda Hong.  04/21/2021 11:33 AM Kelby Aline., MHA, RVT, RDCS, RDMS
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
## 2021-04-21 NOTE — Progress Notes (Signed)
ANTICOAGULATION CONSULT NOTE  Pharmacy Consult for Heparin Indication: DVT  Allergies  Allergen Reactions   Ciprofloxacin Rash and Other (See Comments)    Localized red rash to IV site; "Allergic," per MAR   Penicillins Rash and Other (See Comments)    Tolerates multiple cephalosporins, including cephalexin; "Allergic to Penicillin," per [facility]    Patient Measurements: Heparin Dosing Weight: TBW (58 kg)  Vital Signs: Temp: 98.4 F (36.9 C) (01/25 1149) Temp Source: Oral (01/25 1149) BP: 185/85 (01/25 1149) Pulse Rate: 96 (01/25 1149)  Labs: Recent Labs    04/19/21 1311 04/19/21 1314 04/19/21 1314 04/19/21 1321 04/20/21 0534 04/21/21 0421  HGB  --  10.2*   < > 10.9* 8.7* 10.7*  HCT  --  33.2*   < > 32.0* 27.9* 34.0*  PLT  --  344  --   --  280 340  APTT 23*  --   --   --   --   --   LABPROT 14.3  --   --   --  14.6  --   INR 1.1  --   --   --  1.1  --   CREATININE  --  1.33*   < > 1.30* 0.98 0.95   < > = values in this interval not displayed.    CrCl cannot be calculated (Unknown ideal weight.).   Assessment: 88 year old female with medical history significant for prior CVA and HFpEF who presented with syncope/confusion found to have acute MCA territory infarct and age indeterminate DVTs. Pharmacy consulted to manage heparin infusion.  Per stroke protocol, no bolus and lower goal range of 0.3-0.5. Current CBC stable, no s/sx bleeding reported.  Goal of Therapy:  Heparin level 0.3-0.5 units/ml Monitor platelets by anticoagulation protocol: Yes   Plan: Discontinue enoxaparin 40mg   Start heparin infusion at 1000 units/hr Check heparin level in 8 hours and daily while on heparin Continue to monitor H&H and platelets    Thank you for allowing pharmacy to be a part of this patients care.  Ardyth Harps, PharmD Clinical Pharmacist

## 2021-04-21 NOTE — Evaluation (Signed)
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[place])    Autism    Chronic CHF (congestive heart failure) ([place])    per med rec   Chronic kidney disease (CKD)    stage 3 per chart in 09/2015   Diabetes mellitus without complication (HCC)    Diabetic neuropathy ([place])    History of thyroid nodule    nontoxic goiter per med rec   Hodgkin disease ([place])    in 1980   HTN (hypertension), benign    Hyperlipidemia    Incontinence    Lack of sensation    Osteopenia    per med rec   Poor balance    Stroke ([place])    Tubular adenoma of colon 01/2017   Vitamin D deficiency    Past Surgical History:  Past Surgical History:  Procedure Laterality Date   ABDOMINAL HYSTERECTOMY     BREAST LUMPECTOMY Left    HPI:  Pt is an 88 y.o. female who presented to the ED from ALF for acute onset weakness and hypoxia. MRI brain 04/19/21: Moderately large acute to early subacute posterior right MCA infarct. Acute metabolic encephalopathy present on admission; EMS reported mentation to be at baseline. PMH: HTN, chronic diastolic CHF, V7QI, autism, HLD, and CKD Stage IIIa. BSE 07/30/19: regular texture diet and thin liqiuds recommended    Assessment / Plan / Recommendation  Clinical Impression  Pt was seen for bedside swallow evaluation and she denied a history of dysphagia. Oral mechanism exam was limited due to pt's difficulty following commands; however, oral motor strength and ROM appeared grossly WFL. Dentition was reduced with only maxillary dentures. She tolerated all solids and liquids without s/sx of aspiration. Mastication time was mildly increased secondary  to dentition, but WFL. A regular texture diet with thin liquids is recommended at this time and SLP will follow briefly to ensure tolerance. SLP Visit Diagnosis: Dysphagia, unspecified (R13.10)    Aspiration Risk  Mild aspiration risk    Diet Recommendation Regular;Thin liquid   Liquid Administration via: Cup;Straw Medication Administration: Whole meds with puree Supervision: Staff to assist with self feeding Compensations: Slow rate Postural Changes: Seated upright at 90 degrees    Other  Recommendations Oral Care Recommendations: Oral care BID    Recommendations for follow up therapy are one component of a multi-disciplinary discharge planning process, led by the attending physician.  Recommendations may be updated based on patient status, additional functional criteria and insurance authorization.  Follow up Recommendations  (TBD)      Assistance Recommended at Discharge    Functional Status Assessment Patient has had a recent decline in their functional status and demonstrates the ability to make significant improvements in function in a reasonable and predictable amount of time.  Frequency and Duration min 1 x/week  1 week       Prognosis        Swallow Study   General Date of Onset: 04/20/21 HPI: Pt is an 88 y.o. female who presented to the ED from ALF for acute onset weakness and hypoxia. MRI brain 04/19/21: Moderately large acute to early subacute posterior right MCA infarct. Acute  metabolic encephalopathy present on admission; EMS reported mentation to be at baseline. PMH: HTN, chronic diastolic CHF, Y3OO, autism, HLD, and CKD Stage IIIa. BSE 07/30/19: regular texture diet and thin liqiuds recommended Type of Study: Bedside Swallow Evaluation Previous Swallow Assessment: none Diet Prior to this Study: NPO Temperature Spikes Noted: No Respiratory Status: Room air History of Recent Intubation: No Behavior/Cognition: Lethargic/Drowsy;Cooperative;Pleasant mood Oral Cavity  Assessment: Within Functional Limits Oral Care Completed by SLP: No Oral Cavity - Dentition: Dentures, top;Edentulous Vision: Functional for self-feeding Self-Feeding Abilities: Able to feed self Patient Positioning: Upright in bed;Postural control adequate for testing Baseline Vocal Quality: Normal Volitional Cough: Cognitively unable to elicit Volitional Swallow: Unable to elicit    Oral/Motor/Sensory Function Overall Oral Motor/Sensory Function:  (unable to thoroughly assess)   Ice Chips Ice chips: Within functional limits Presentation: Spoon   Thin Liquid Thin Liquid: Within functional limits Presentation: Cup;Straw;Self Fed    Nectar Thick Nectar Thick Liquid: Not tested   Honey Thick Honey Thick Liquid: Not tested   Puree Puree: Within functional limits Presentation: Spoon   Solid     Solid: Within functional limits     Joletta Manner I. Hardin Negus, [place], [place] Office number [phone] Pager [phone]  Horton Marshall 04/21/2021,9:40 AM

## 2021-04-21 NOTE — Progress Notes (Signed)
°  Transition of Care [facility]) Screening Note   Patient Details  Name: Lisa Villegas Date of Birth: 1930/10/25   Transition of Care [facility]) CM/SW Contact:    Geralynn Ochs, LCSW Phone Number: 04/21/2021, 10:46 AM    Transition of Care Department [facility]) has reviewed patient and no TOC needs have been identified at this time; medical workup ongoing, palliative consult pending. We will continue to monitor patient advancement through interdisciplinary progression rounds. If new patient transition needs arise, please place a TOC consult.

## 2021-04-21 NOTE — Procedures (Signed)
Patient Name: Houa Nie  MRN: [id number]  Epilepsy Attending: Lora Havens  Referring Physician/Provider: Lavina Hamman, MD Date: 04/20/2021 Duration: 25.58 mins  Patient history: 88 year old female with syncope.  EEG to evaluate for seizures. Level of alertness: Awake  AEDs during EEG study: None  Technical aspects: This EEG study was done with scalp electrodes positioned according to the 10-20 International system of electrode placement. Electrical activity was acquired at a sampling rate of 500Hz  and reviewed with a high frequency filter of 70Hz  and a low frequency filter of 1Hz . EEG data were recorded continuously and digitally stored.   Description: The posterior dominant rhythm consists of 8 Hz activity of moderate voltage (25-35 uV) seen predominantly in posterior head regions, symmetric and reactive to eye opening and eye closing.  EEG showed continuous 3 to 5 Hz theta -delta slowing in her right frontotemporal region.  Hyperventilation and photic stimulation were not performed.     ABNORMALITY -Continuous slow, right frontotemporal region  IMPRESSION: This study is suggestive of cortical dysfunction in right frontotemporal region likely secondary to underlying stroke.  No seizures or epileptiform discharges were seen throughout the recording.  Wilburta Milbourn Barbra Sarks

## 2021-04-21 NOTE — Progress Notes (Signed)
Initial Nutrition Assessment  DOCUMENTATION CODES:   Severe malnutrition in context of chronic illness  INTERVENTION:  -Ensure Enlive po BID, each supplement provides 350 kcal and 20 grams of protein -assist pt w/ meals -MVI with minerals daily  NUTRITION DIAGNOSIS:   Severe Malnutrition related to chronic illness (CHF, h/o CVA) as evidenced by severe muscle depletion, severe fat depletion.  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  Pt is a poor historian and provided no meaningful responses to RD questions. Eating lunch at time of visit with encouragement from NT who reports pt did not eat breakfast.   PO Intake: 75% x 1 recorded meal  UOP: 1x unmeasured occurrence x24 hours I/O: +2818ml since admit  Non-pitting edema noted to BUE, moderate pitting edema to RLE, and mild pitting edema to LLE per RN assessment  Medications: lasix, SSI Q4H, IV protonix Labs: Recent Labs  Lab 04/19/21 1314 04/19/21 1321 04/20/21 0534 04/21/21 0421  NA 137 136 134* 132*  K 4.6 4.6 4.5 4.0  CL 105 106 106 102  CO2 18*  --  21* 18*  BUN 29* 27* 24* 13  CREATININE 1.33* 1.30* 0.98 0.95  CALCIUM 8.5*  --  8.3* 8.8*  MG  --   --   --  1.4*  GLUCOSE 145* 142* 127* 120*  CBGs: [id number]    NUTRITION - FOCUSED PHYSICAL EXAM:  Flowsheet Row Most Recent Value  Orbital Region Severe depletion  Upper Arm Region Severe depletion  Thoracic and Lumbar Region Unable to assess  Buccal Region Moderate depletion  Temple Region Moderate depletion  Clavicle Bone Region Severe depletion  Clavicle and Acromion  Bone Region Severe depletion  Scapular Bone Region Severe depletion  Dorsal Hand Moderate depletion  Patellar Region Moderate depletion  Anterior Thigh Region Moderate depletion  Posterior Calf Region Severe depletion  Edema (RD Assessment) Mild  Hair Other (Comment)  [thinning]  Eyes Reviewed  Mouth Unable to assess  Skin Reviewed  Nails Reviewed       Diet Order:   Diet Order             Diet regular Room service appropriate? Yes with Assist; Fluid consistency: Thin  Diet effective now                   EDUCATION NEEDS:   No education needs have been identified at this time  Skin:  Skin Assessment: Reviewed RN Assessment  Last BM:  PTA  Height:   Ht Readings from Last 1 Encounters:  04/01/21 5\' 5"  (1.651 m)    Weight:   Wt Readings from Last 1 Encounters:  04/21/21 59.8 kg       BMI:  Body mass index is 21.94 kg/m.  Estimated Nutritional Needs:   Kcal:  1500-1700  Protein:  70-80 grams  Fluid:  >1.5L/d     Theone Stanley., MS, RD, LDN (she/her/hers) RD pager number and weekend/on-call pager number located in [place].

## 2021-04-21 NOTE — Progress Notes (Signed)
ANTICOAGULATION CONSULT NOTE  Pharmacy Consult for Heparin Indication: DVT  Allergies  Allergen Reactions   Ciprofloxacin Rash and Other (See Comments)    Localized red rash to IV site; "Allergic," per MAR   Penicillins Rash and Other (See Comments)    Tolerates multiple cephalosporins, including cephalexin; "Allergic to Penicillin," per [facility]    Patient Measurements: Heparin Dosing Weight: TBW (58 kg)  Vital Signs: Temp: 98.9 F (37.2 C) (01/25 1910) Temp Source: Oral (01/25 1910) BP: 130/66 (01/25 1910) Pulse Rate: 92 (01/25 1910)  Labs: Recent Labs    04/19/21 1311 04/19/21 1314 04/19/21 1314 04/19/21 1321 04/20/21 0534 04/21/21 0421 04/21/21 2130  HGB  --  10.2*   < > 10.9* 8.7* 10.7*  --   HCT  --  33.2*   < > 32.0* 27.9* 34.0*  --   PLT  --  344  --   --  280 340  --   APTT 23*  --   --   --   --   --   --   LABPROT 14.3  --   --   --  14.6  --   --   INR 1.1  --   --   --  1.1  --   --   HEPARINUNFRC  --   --   --   --   --   --  0.72*  CREATININE  --  1.33*   < > 1.30* 0.98 0.95  --    < > = values in this interval not displayed.     Estimated Creatinine Clearance: 36.8 mL/min (by C-G formula based on SCr of 0.95 mg/dL).   Assessment: 88 year old female with medical history significant for prior CVA and HFpEF who presented with syncope/confusion found to have acute MCA territory infarct and age indeterminate DVTs. Pharmacy consulted to manage heparin infusion.  Per stroke protocol, no bolus and lower goal range of 0.3-0.5. Current CBC stable, no s/sx bleeding reported.  1/25 PM update:  Heparin level supra-therapeutic   Goal of Therapy:  Heparin level 0.3-0.5 units/ml Monitor platelets by anticoagulation protocol: Yes   Plan: Dec heparin to 800 units/hr Re-check heparin level in 8 hours  Narda Bonds, PharmD, [place] Pharmacist Phone: [phone]

## 2021-04-21 NOTE — Progress Notes (Signed)
Echocardiogram 2D Echocardiogram has been performed.  Lisa Villegas 04/21/2021, 8:59 AM

## 2021-04-21 NOTE — Consult Note (Signed)
Consultation Note Date: 04/21/2021   Patient Name: Lisa Villegas  DOB: 01/14/1931  MRN: [id number]  Age / Sex: 88 y.o., female  PCP: [name], MD Referring Physician: Jonetta Osgood, MD  Reason for Consultation:  goals of care, acute CVA  HPI/Patient Profile: 88 y.o. female  with past medical history of autism, prior CVA, heart failure, CKD IIIa, DM, HTN, HLD admitted on 04/19/2021 with MCA territory infarct, acute on chronic renal injury. Admission has been complicated by bilateral pleural effusions and ECHO is pending.    Primary Decision Maker HCPOA- Lisa Villegas   Discussion: I have reviewed medical records including EPIC notes, labs and imaging, assessed the patient and then spoke with her [place] by phone  to discuss diagnosis prognosis, Lisa Villegas, EOL wishes, disposition and options.  I introduced Palliative Medicine as specialized medical care for people living with serious illness. It focuses on providing relief from the symptoms and stress of a serious illness. The goal is to improve quality of life for both the patient and the family.  We discussed a brief life review of the patient. 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  She has siblings but they have not seen her in 39 years. Lisa Villegas took over care for [place] when her mother died. Lisa Villegas enjoys being called "Lisa Villegas". She finds joy in listening to classical music and watching cartoons.   As far as functional and nutritional status- she requires (and enjoys) a great amount of assistance. Lisa Villegas reports that Lisa Villegas has never bathed on her own in her life. She always has a good appetite. She likes fruits, bananas, and all things chocolate especially ice cream. Today Lisa Villegas's lunch tray was at bedside completely cleared of the spaghetti.  Prior to  admission Lisa Villegas was able to ambulate minimally with a walker, however, this was complicated due to her recent broken foot. She was a bit dysarthric and aphasic prior to admission.   Lisa Villegas shares that Lisa Villegas does not sense if she has voided urine or had a BM and it doesn't bother her- she has always been this way, this is not new.   Lisa Villegas is hearing impaired and can only hear if spoken to very loudly.   I attempted to elicit values and goals of care important to the patient.  Lisa Villegas has told Lisa Villegas that her main goals are to be completely cared for by others. [place] is not important to her. In fact at one point they were discussing artificial tube feedings and Lisa Villegas told Lisa Villegas- that would be great because then I wouldn't have to chew.   Lisa Villegas enjoys challenges- Lisa Villegas said that if anything is needed of Lisa Villegas, or information was needed from her- then it is best to tell her you are giving her a test and she is more likely to respond.   DNR code status was established prior to this admission. I discussed MOST form with Lisa Villegas.   Discussed with  patient/family the importance of continued conversation with family and the medical providers regarding overall plan of care and treatment options, ensuring decisions are within the context of the patients values and GOCs.    Questions and concerns were addressed. The family was encouraged to call with questions or concerns.    SUMMARY OF RECOMMENDATIONS -Acute infarct- continue current care, Lisa Villegas is hopeful Lisa Villegas can return to rehab facility at [place] to meet [place] tomorrow morning at 10am to review MOST form  Code Status/Advance Care Planning: DNR   Prognosis:   Unable to determine  Discharge Planning: To Be Determined  Primary Diagnoses: Present on Admission:  Acute posterior right MCA territory infarct  Hyperlipidemia  GERD (gastroesophageal reflux disease)  Hypertensive urgency  SIRS (systemic inflammatory response syndrome)  (HCC)  Syncope  Acute metabolic encephalopathy  Acute renal failure superimposed on stage 3a chronic kidney disease (HCC)  Lactic acidosis  Bilateral pleural effusion  Multiple closed fractures of metatarsal bone of right foot  FTT (failure to thrive) in adult  Malnutrition of moderate degree  Cytotoxic cerebral edema (HCC)  Type II diabetes mellitus with renal manifestations (A1c 6.8 on 1/24)   Review of Systems  Physical Exam  Vital Signs: BP 134/67    Pulse (!) 105    Temp 98.4 F (36.9 C) (Oral)    Resp 16    Wt 59.8 kg    SpO2 95%    BMI 21.94 kg/m  Pain Scale: 0-10 POSS *See Group Information*: 2-Acceptable,Slightly drowsy, easily aroused Pain Score: 0-No pain   SpO2: SpO2: 95 % O2 Device:SpO2: 95 % O2 Flow Rate: .O2 Flow Rate (L/min): 2 L/min  IO: Intake/output summary:  Intake/Output Summary (Last 24 hours) at 04/21/2021 1519 Last data filed at 04/21/2021 1300 Gross per 24 hour  Intake 707.93 ml  Output --  Net 707.93 ml    LBM:   Baseline Weight: Weight: 59.8 kg Most recent weight: Weight: 59.8 kg     Palliative Assessment/Data: PPS: 50%        Signed by: Mariana Kaufman, AGNP-C Palliative Medicine    Please contact Palliative Medicine Team phone at [phone] for questions and concerns.  For individual provider: See Shea Evans

## 2021-04-21 NOTE — Hospital Course (Addendum)
88 year old female with history of autism, prior CVA, HFpEF, CKD stage IIIa, DM-2, HTN, HLD-who presented with syncope/confusion-found to have acute MCA territory infarct and bilateral lower extremity DVT.  See below for further details.  Significant events: 1/23>>  admit to [facility] for syncope/acute CVA 1/24>>  Transferred to [facility]  Significant studies: 1/23>> CXR: Bilateral pleural effusion 1/23>> MRI brain: Moderate large acute posterior right MCA infarct.  Associated cytotoxic edema 1/24>> MRA head/neck: 70% stenosis left ICA, 60% proximal right ICA, moderate to severe stenosis right P2/P3, severe stenosis of distal left M1, proximal M2. 1/24>> A1c: 6.8 1/24>> LDL: 134 1/25>> Spot EEG: No seizures. 1/25>> carotid Doppler: Right ICA 40-59% stenosis, left ICA 1-39% stenosis 1/25>> lower extremity Dopplers: DVT left posterior tibial/left peroneal vein, age-indeterminate DVT involving the right common femoral, right femoral, right proximal profunda, popliteal, and right posterior tibial vein. 1/25>> Echo: EF 69-67%, grade 1 diastolic dysfunction.  RV systolic function is moderately reduced.  Microbiology data: 1/23>> blood culture: No growth

## 2021-04-21 NOTE — Assessment & Plan Note (Addendum)
CBG stable with SSI.   Recent Labs    04/22/21 2329 04/23/21 0319 04/23/21 0803  GLUCAP 112* 123* 122*

## 2021-04-22 ENCOUNTER — Other Ambulatory Visit (HOSPITAL_COMMUNITY): Payer: Self-pay

## 2021-04-22 DIAGNOSIS — I160 Hypertensive urgency: Secondary | ICD-10-CM

## 2021-04-22 DIAGNOSIS — E7800 Pure hypercholesterolemia, unspecified: Secondary | ICD-10-CM

## 2021-04-22 DIAGNOSIS — E1121 Type 2 diabetes mellitus with diabetic nephropathy: Secondary | ICD-10-CM

## 2021-04-22 DIAGNOSIS — R55 Syncope and collapse: Secondary | ICD-10-CM | POA: Diagnosis not present

## 2021-04-22 DIAGNOSIS — R627 Adult failure to thrive: Secondary | ICD-10-CM | POA: Diagnosis not present

## 2021-04-22 DIAGNOSIS — I82409 Acute embolism and thrombosis of unspecified deep veins of unspecified lower extremity: Secondary | ICD-10-CM

## 2021-04-22 DIAGNOSIS — J90 Pleural effusion, not elsewhere classified: Secondary | ICD-10-CM | POA: Diagnosis not present

## 2021-04-22 DIAGNOSIS — I82413 Acute embolism and thrombosis of femoral vein, bilateral: Secondary | ICD-10-CM | POA: Diagnosis not present

## 2021-04-22 DIAGNOSIS — E43 Unspecified severe protein-calorie malnutrition: Secondary | ICD-10-CM | POA: Diagnosis present

## 2021-04-22 DIAGNOSIS — I63411 Cerebral infarction due to embolism of right middle cerebral artery: Secondary | ICD-10-CM | POA: Diagnosis not present

## 2021-04-22 DIAGNOSIS — Z515 Encounter for palliative care: Secondary | ICD-10-CM | POA: Diagnosis not present

## 2021-04-22 DIAGNOSIS — Z741 Need for assistance with personal care: Secondary | ICD-10-CM

## 2021-04-22 LAB — GLUCOSE, CAPILLARY
Glucose-Capillary: 129 mg/dL — ABNORMAL HIGH (ref 70–99)
Glucose-Capillary: 118 mg/dL — ABNORMAL HIGH (ref 70–99)
Glucose-Capillary: 133 mg/dL — ABNORMAL HIGH (ref 70–99)
Glucose-Capillary: 150 mg/dL — ABNORMAL HIGH (ref 70–99)
Glucose-Capillary: 107 mg/dL — ABNORMAL HIGH (ref 70–99)
Glucose-Capillary: 112 mg/dL — ABNORMAL HIGH (ref 70–99)

## 2021-04-22 LAB — BASIC METABOLIC PANEL WITH GFR
Anion gap: 9 (ref 5–15)
BUN: 19 mg/dL (ref 8–23)
CO2: 20 mmol/L — ABNORMAL LOW (ref 22–32)
Calcium: 8.4 mg/dL — ABNORMAL LOW (ref 8.9–10.3)
Chloride: 106 mmol/L (ref 98–111)
Creatinine, Ser: 1.25 mg/dL — ABNORMAL HIGH (ref 0.44–1.00)
GFR, Estimated: 41 mL/min — ABNORMAL LOW
Glucose, Bld: 122 mg/dL — ABNORMAL HIGH (ref 70–99)
Potassium: 3.6 mmol/L (ref 3.5–5.1)
Sodium: 135 mmol/L (ref 135–145)

## 2021-04-22 LAB — CBC WITH DIFFERENTIAL/PLATELET
Abs Immature Granulocytes: 0.22 10*3/uL — ABNORMAL HIGH (ref 0.00–0.07)
Basophils Absolute: 0 10*3/uL (ref 0.0–0.1)
Basophils Relative: 0 %
Eosinophils Absolute: 0.2 10*3/uL (ref 0.0–0.5)
Eosinophils Relative: 2 %
HCT: 29.4 % — ABNORMAL LOW (ref 36.0–46.0)
Hemoglobin: 9.4 g/dL — ABNORMAL LOW (ref 12.0–15.0)
Immature Granulocytes: 2 %
Lymphocytes Relative: 21 %
Lymphs Abs: 1.9 10*3/uL (ref 0.7–4.0)
MCH: 27.4 pg (ref 26.0–34.0)
MCHC: 32 g/dL (ref 30.0–36.0)
MCV: 85.7 fL (ref 80.0–100.0)
Monocytes Absolute: 0.7 10*3/uL (ref 0.1–1.0)
Monocytes Relative: 7 %
Neutro Abs: 6 10*3/uL (ref 1.7–7.7)
Neutrophils Relative %: 68 %
Platelets: 324 10*3/uL (ref 150–400)
RBC: 3.43 MIL/uL — ABNORMAL LOW (ref 3.87–5.11)
RDW: 16 % — ABNORMAL HIGH (ref 11.5–15.5)
WBC: 9 10*3/uL (ref 4.0–10.5)
nRBC: 0.2 % (ref 0.0–0.2)

## 2021-04-22 LAB — HEPARIN LEVEL (UNFRACTIONATED): Heparin Unfractionated: 0.4 IU/mL (ref 0.30–0.70)

## 2021-04-22 LAB — MAGNESIUM: Magnesium: 1.3 mg/dL — ABNORMAL LOW (ref 1.7–2.4)

## 2021-04-22 NOTE — TOC Benefit Eligibility Note (Signed)
Patient Teacher, English as a foreign language completed.    The patient is currently admitted and upon discharge could be taking Eliquis 5 mg.  The current 30 day co-pay is, $30.00.   The patient is insured through [employer] [place] Medicare Part D     Lyndel Safe, [place] Patient Advocate Specialist [place] Patient Advocate Team Direct Number: [phone]  Fax: [phone]
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
## 2021-04-22 NOTE — Progress Notes (Addendum)
PROGRESS NOTE        PATIENT DETAILS Name: Lisa Villegas Age: 88 y.o. Sex: female Date of Birth: 1930/04/08 Admit Date: 04/19/2021 Admitting Physician Jonnie Finner, DO MHD:[id number], Ajith, MD  Brief Summary: 88 year old female with history of autism, prior CVA, HFpEF, CKD stage IIIa, DM-2, HTN, HLD-who presented with syncope/confusion-found to have acute MCA territory infarct and bilateral lower extremity DVT.  See below for further details.  Significant events: 1/23>>  admit to [facility] for syncope/acute CVA 1/24>>.  Transferred to [facility]  Significant studies: 1/23>> CXR: Bilateral pleural effusion 1/23>> MRI brain: Moderate large acute posterior right MCA infarct.  Associated cytotoxic edema 1/24>> MRA head/neck: 70% stenosis left ICA, 60% proximal right ICA, moderate to severe stenosis right P2/P3, severe stenosis of distal left M1, proximal M2. 1/24>> A1c: 6.8 1/24>> LDL: 134 1/25>> Spot EEG: No seizures. 1/25>> carotid Doppler: Right ICA 40-59% stenosis, left ICA 1-39% stenosis 1/25>> lower extremity Dopplers: DVT left posterior tibial/left peroneal vein, age-indeterminate DVT involving the right common femoral, right femoral, right proximal profunda, popliteal, and right posterior tibial vein. 1/25>> Echo: EF 94-17%, grade 1 diastolic dysfunction.  RV systolic function is moderately reduced.  Microbiology data: 1/23>> blood culture: No growth  Subjective: Lying comfortably in bed-no major issues overnight.  Objective: Vitals: Blood pressure (!) 132/49, pulse 77, temperature 97.6 F (36.4 C), temperature source Oral, resp. rate 20, weight 59.8 kg, SpO2 93 %.   Exam: Gen Exam: Not in distress-slight dysarthria continues. HEENT:atraumatic, normocephalic Chest: B/L clear to auscultation anteriorly CVS:S1S2 regular Abdomen:soft non tender, non distended Extremities:no edema Neurology: Non focal Skin: no rash   Pertinent  Labs/Radiology: CBC Latest Ref Rng & Units 04/22/2021 04/21/2021 04/20/2021  WBC 4.0 - 10.5 K/uL 9.0 9.1 8.1  Hemoglobin 12.0 - 15.0 g/dL 9.4(L) 10.7(L) 8.7(L)  Hematocrit 36.0 - 46.0 % 29.4(L) 34.0(L) 27.9(L)  Platelets 150 - 400 K/uL 324 340 280    Lab Results  Component Value Date   NA 135 04/22/2021   K 3.6 04/22/2021   CL 106 04/22/2021   CO2 20 (L) 04/22/2021      Assessment/Plan: * Acute posterior right MCA territory infarct- (present on admission) Continues to have dysarthria-but motor exam appears to be symmetrical with significant generalized weakness.  Suspicion for PFO-and possible embolic stroke given that she has significant DVT in her lower extremities.  It is also possible that this could be thromboembolic stroke from large vessel disease.  See work-up as above.  On IV heparin-neurology recommending transitioning to oral anticoagulant in a few days.      Syncope- (present on admission) Initially thought to be vasovagal or orthostatic etiology-now with extensive DVT-in slightly decreased RV systolic function on echo-suspect that this could be from VTE/pulmonary embolism.  EEG without seizures.  Already on anticoagulation-further work-up including a CTA chest would not change management-as she is already on anticoagulation.  Bilateral lower extremity DVT. Likely provoked due to sedentary lifestyle-given the fact that she syncopized-and has mild RV systolic dysfunction-likely has a PE as well.  However she is hemodynamically stable-not hypoxic-further work-up will not change management-as she is already on IV heparin-hence will not pursue CTA chest.  Will probably require indefinite anticoagulation.  Acute metabolic encephalopathy- (present on admission) Probably due to AKI/CVA/possible VTE-she is back to her baseline.  Acute renal failure superimposed on stage 3a chronic kidney  disease ([place])- (present on admission) AKI likely hemodynamically mediated-improved with  supportive care.  Continue to follow electrolytes periodically.  Bilateral pleural effusion- (present on admission) Probably related to HFpEF-or from pulmonary embolism.  Given the fact that she is asymptomatic/frail-would proceed with thoracocentesis.  It is highly likely that this is a transudate-continue diuretics.  SIRS (systemic inflammatory response syndrome) ([place])- (present on admission) Noninfectious etiology-no signs of infection.  Being monitored off antimicrobial therapy.  Follow cultures.    Lactic acidosis- (present on admission) Suspect due to hypoperfusion/dehydration-no indication of infection at this point.  Doubt any clinical significance at this point-has been adequately hydrated.  Hypertensive urgency- (present on admission) BP stable-continue Verapmail, Metoprolol, Hydaralazine  Type II diabetes mellitus with renal manifestations (A1c 6.8 on 1/24)- (present on admission) CBG stable with SSI.   Recent Labs    04/22/21 0351 04/22/21 0811 04/22/21 1157  GLUCAP 129* 118* 133*    Hyperlipidemia- (present on admission) Continue high intensity statin.  Repeat lipid panel in 3 months.  GERD (gastroesophageal reflux disease)- (present on admission) Continue PPI.  FTT (failure to thrive) in adult- (present on admission) Prognosis is guarded in the setting of large stroke.  Appears frail at baseline-with numerous medical problems-do not think she is a good candidate for aggressive care-DNR in place.Marland Kitchen  Appreciate palliative care evaluation.  History of Hodgkin's disease Reported history.  Unable to verify.  Malnutrition of moderate degree- (present on admission) Continue supplements per dietitian.  Multiple closed fractures of metatarsal bone of right foot- (present on admission) Recent admission for the same.  Resume outpatient follow-up with orthopedics.   BMI: Estimated body mass index is 21.94 kg/m as calculated from the following:   Height as of 04/01/21:  5\' 5"  (1.651 m).   Weight as of this encounter: 59.8 kg.    DVT Prophylaxis: SQ Lovenox Procedures: None Consults: Neuro, palliative care Code Status:DNR Family Communication: Legal gaurdianMarylynn Pearson [phone] on 1/26.   Disposition Plan: Status is: Inpatient  Remains inpatient appropriate because: Acute CVA work up in progress    Diet: Diet Order             Diet regular Room service appropriate? Yes with Assist; Fluid consistency: Thin  Diet effective now                     Antimicrobial agents: Anti-infectives (From admission, onward)    Start     Dose/Rate Route Frequency Ordered Stop   04/21/21 1600  vancomycin (VANCOCIN) IVPB 1000 mg/200 mL premix  Status:  Discontinued        1,000 mg 200 mL/hr over 60 Minutes Intravenous Every 48 hours 04/19/21 1609 04/20/21 0823   04/19/21 1615  vancomycin (VANCOREADY) IVPB 1250 mg/250 mL        1,250 mg 166.7 mL/hr over 90 Minutes Intravenous  Once 04/19/21 1609 04/19/21 2004   04/19/21 1615  ceFEPIme (MAXIPIME) 2 g in sodium chloride 0.9 % 100 mL IVPB  Status:  Discontinued        2 g 200 mL/hr over 30 Minutes Intravenous Every 12 hours 04/19/21 1611 04/20/21 0823   04/19/21 1330  cefTRIAXone (ROCEPHIN) 2 g in sodium chloride 0.9 % 100 mL IVPB        2 g 200 mL/hr over 30 Minutes Intravenous  Once 04/19/21 1324 04/19/21 1432   04/19/21 1330  azithromycin (ZITHROMAX) 500 mg in sodium chloride 0.9 % 250 mL IVPB        500  mg 250 mL/hr over 60 Minutes Intravenous  Once 04/19/21 1324 04/19/21 1530        MEDICATIONS: Scheduled Meds:  atorvastatin  40 mg Oral Daily   feeding supplement  237 mL Oral BID BM   furosemide  40 mg Oral Daily   hydrALAZINE  50 mg Oral TID   insulin aspart  0-9 Units Subcutaneous Q4H   metoprolol tartrate  12.5 mg Oral BID   multivitamin with minerals  1 tablet Oral Daily   oxybutynin  5 mg Oral BID   pantoprazole (PROTONIX) IV  40 mg Intravenous Q24H   verapamil  120 mg  Oral QHS   Continuous Infusions:  heparin 800 Units/hr (04/22/21 0040)   PRN Meds:.   I have personally reviewed following labs and imaging studies  LABORATORY DATA: CBC: Recent Labs  Lab 04/19/21 1314 04/19/21 1321 04/20/21 0534 04/21/21 0421 04/22/21 0508  WBC 11.3*  --  8.1 9.1 9.0  NEUTROABS 9.6*  --   --  6.7 6.0  HGB 10.2* 10.9* 8.7* 10.7* 9.4*  HCT 33.2* 32.0* 27.9* 34.0* 29.4*  MCV 90.0  --  89.4 86.1 85.7  PLT 344  --  280 340 409    Basic Metabolic Panel: Recent Labs  Lab 04/19/21 1314 04/19/21 1321 04/20/21 0534 04/21/21 0421 04/22/21 0508  NA 137 136 134* 132* 135  K 4.6 4.6 4.5 4.0 3.6  CL 105 106 106 102 106  CO2 18*  --  21* 18* 20*  GLUCOSE 145* 142* 127* 120* 122*  BUN 29* 27* 24* 13 19  CREATININE 1.33* 1.30* 0.98 0.95 1.25*  CALCIUM 8.5*  --  8.3* 8.8* 8.4*  MG  --   --   --  1.4* 1.3*    GFR: Estimated Creatinine Clearance: 28 mL/min (A) (by C-G formula based on SCr of 1.25 mg/dL (H)).  Liver Function Tests: Recent Labs  Lab 04/19/21 1314 04/20/21 0534  AST 26 18  ALT 25 28  ALKPHOS 109 100  BILITOT 0.4 0.4  PROT 6.7 5.9*  ALBUMIN 3.0* 2.7*   No results for input(s): LIPASE, AMYLASE in the last 168 hours. No results for input(s): AMMONIA in the last 168 hours.  Coagulation Profile: Recent Labs  Lab 04/19/21 1311 04/20/21 0534  INR 1.1 1.1    Cardiac Enzymes: No results for input(s): CKTOTAL, CKMB, CKMBINDEX, TROPONINI in the last 168 hours.  BNP (last 3 results) No results for input(s): PROBNP in the last 8760 hours.  Lipid Profile: Recent Labs    04/20/21 0534  CHOL 211*  HDL 53  LDLCALC 134*  TRIG 119  CHOLHDL 4.0    Thyroid Function Tests: No results for input(s): TSH, T4TOTAL, FREET4, T3FREE, THYROIDAB in the last 72 hours.  Anemia Panel: No results for input(s): VITAMINB12, FOLATE, FERRITIN, TIBC, IRON, RETICCTPCT in the last 72 hours.  Urine analysis:    Component Value Date/Time   COLORURINE  YELLOW 04/19/2021 1924   APPEARANCEUR HAZY (A) 04/19/2021 1924   APPEARANCEUR Clear 10/11/2017 1310   LABSPEC 1.025 04/19/2021 1924   PHURINE 5.0 04/19/2021 1924   GLUCOSEU NEGATIVE 04/19/2021 1924   HGBUR NEGATIVE 04/19/2021 1924   BILIRUBINUR NEGATIVE 04/19/2021 1924   BILIRUBINUR Negative 10/11/2017 1310   KETONESUR 5 (A) 04/19/2021 1924   PROTEINUR 100 (A) 04/19/2021 1924   UROBILINOGEN negative 03/14/2016 1227   NITRITE NEGATIVE 04/19/2021 1924   LEUKOCYTESUR SMALL (A) 04/19/2021 1924    Sepsis Labs: Lactic Acid, Venous    Component Value  Date/Time   LATICACIDVEN 1.4 04/19/2021 2230    MICROBIOLOGY: Recent Results (from the past 240 hour(s))  Blood Culture (routine x 2)     Status: None (Preliminary result)   Collection Time: 04/19/21  1:07 PM   Specimen: Left Antecubital; Blood  Result Value Ref Range Status   Specimen Description   Final    LEFT ANTECUBITAL Performed at [facility] [address], [place], [place] [address]    Special Requests   Final    Blood Culture results may not be optimal due to an inadequate volume of blood received in culture bottles BOTTLES DRAWN AEROBIC AND ANAEROBIC Performed at [facility], [place] [address] [place], [place] [address]    Culture   Final    NO GROWTH 3 DAYS Performed at [facility], [place] [address], [place], [place] [address]    Report Status PENDING  Incomplete  Resp Panel by RT-PCR (Flu A&B, Covid) Nasopharyngeal Swab     Status: None   Collection Time: 04/19/21  2:08 PM   Specimen: Nasopharyngeal Swab; Nasopharyngeal(NP) swabs in vial transport medium  Result Value Ref Range Status   SARS Coronavirus 2 by RT PCR NEGATIVE NEGATIVE Final    Comment: (NOTE) SARS-CoV-2 target nucleic acids are NOT DETECTED.  The SARS-CoV-2 RNA is generally detectable in upper respiratory specimens during the acute phase of infection. The lowest concentration of SARS-CoV-2 viral copies  this assay can detect is 138 copies/mL. A negative result does not preclude SARS-Cov-2 infection and should not be used as the sole basis for treatment or other patient management decisions. A negative result may occur with  improper specimen collection/handling, submission of specimen other than nasopharyngeal swab, presence of viral mutation(s) within the areas targeted by this assay, and inadequate number of viral copies(<138 copies/mL). A negative result must be combined with clinical observations, patient history, and epidemiological information. The expected result is Negative.  Fact Sheet for Patients:  EntrepreneurPulse.com.au  Fact Sheet for Healthcare Providers:  IncredibleEmployment.be  This test is no t yet approved or cleared by the [place] FDA and  has been authorized for detection and/or diagnosis of SARS-CoV-2 by FDA under an Emergency Use Authorization (EUA). This EUA will remain  in effect (meaning this test can be used) for the duration of the COVID-19 declaration under Section 564(b)(1) of the Act, 21 U.S.C.section 360bbb-3(b)(1), unless the authorization is terminated  or revoked sooner.       Influenza A by PCR NEGATIVE NEGATIVE Final   Influenza B by PCR NEGATIVE NEGATIVE Final    Comment: (NOTE) The Xpert Xpress SARS-CoV-2/FLU/RSV plus assay is intended as an aid in the diagnosis of influenza from Nasopharyngeal swab specimens and should not be used as a sole basis for treatment. Nasal washings and aspirates are unacceptable for Xpert Xpress SARS-CoV-2/FLU/RSV testing.  Fact Sheet for Patients: EntrepreneurPulse.com.au  Fact Sheet for Healthcare Providers: IncredibleEmployment.be  This test is not yet approved or cleared by the [place] FDA and has been authorized for detection and/or diagnosis of SARS-CoV-2 by FDA under an Emergency Use Authorization (EUA). This EUA  will remain in effect (meaning this test can be used) for the duration of the COVID-19 declaration under Section 564(b)(1) of the Act, 21 U.S.C. section 360bbb-3(b)(1), unless the authorization is terminated or revoked.  Performed at [facility], [place] [address] [place], [place] [address]   Urine Culture     Status: Abnormal   Collection Time: 04/19/21  7:24 PM   Specimen: In/Out Cath Urine  Result Value Ref Range Status   Specimen Description   Final    IN/OUT CATH URINE Performed at [facility], [place] [address] [place], [place] [address]    Special Requests   Final    NONE Performed at [facility], [place] [address] [place], [place] [address]    Culture MULTIPLE SPECIES PRESENT, SUGGEST RECOLLECTION (A)  Final   Report Status 04/21/2021 FINAL  Final  Blood Culture (routine x 2)     Status: None (Preliminary result)   Collection Time: 04/19/21 10:29 PM   Specimen: BLOOD  Result Value Ref Range Status   Specimen Description   Final    BLOOD SITE NOT SPECIFIED Performed at [facility] [address] [place], [place] [address]    Special Requests   Final    BOTTLES DRAWN AEROBIC AND ANAEROBIC Blood Culture adequate volume Performed at [facility] [address], [place], [place] [address]    Culture   Final    NO GROWTH 2 DAYS Performed at [facility] [address], [place],  [address]    Report Status PENDING  Incomplete  MRSA Next Gen by PCR, Nasal     Status: None   Collection Time: 04/19/21 10:30 PM   Specimen: Peripheral; Nasal Swab  Result Value Ref Range Status   MRSA by PCR Next Gen NOT DETECTED NOT DETECTED Final    Comment: (NOTE) The GeneXpert MRSA Assay (FDA approved for NASAL specimens only), is one component of a comprehensive MRSA colonization surveillance program. It is not intended to diagnose MRSA infection nor to guide or monitor treatment for MRSA  infections. Test performance is not FDA approved in patients less than 15 years old. Performed at [facility], [place] [address] [place] [address] [place],  [address]     RADIOLOGY STUDIES/RESULTS: MR ANGIO HEAD WO CONTRAST  Result Date: 04/20/2021 CLINICAL DATA:  Stroke/TIA, determine embolic source EXAM: MRA NECK WITHOUT CONTRAST MRA HEAD WITHOUT CONTRAST TECHNIQUE: Angiographic images of the Circle of Willis were acquired using MRA technique without intravenous contrast. COMPARISON:  MRA 07/26/2017 trauma correlation is also made with MRI head 04/19/2021 FINDINGS: MRA NECK FINDINGS Two-vessel arch with a common origin of the brachiocephalic and left common carotid arteries. Imaged portion shows no evidence of aneurysm or dissection. No significant stenosis of the major arch vessel origins. Right common, internal, and external carotid arteries are patent, with approximately 60% narrowing of the right proximal ICA (series 4, image 54), likely secondary to calcified plaque. Left common carotid artery is patent. Poor evaluation of the bifurcation, likely secondary to the degree of calcified atherosclerosis. While this limits evaluation for stenosis, suspect approximately 70% luminal narrowing in the proximal left ICA. There is also likely severe stenosis at the origin of the left ECA. Extracranial vertebral arteries are patent, without hemodynamically significant stenosis. Possible mild narrowing of the proximal left V3 segment, although this is favored to be artifactual. MRA HEAD FINDINGS Both internal carotid arteries are patent to the termini, without stenosis or other abnormality. A1 segments patent. Normal anterior communicating artery. Anterior cerebral arteries are patent to their distal aspects. Mild-to-moderate narrowing of the right M1 (series 2, image 106), which has progressed since 2019. Moderate narrowing of the mid left M1 and severe narrowing in the distal left M1 or proximal M2  (series 2, image 111), both of which are new from the prior exam.  Normal MCA bifurcations. Mild loss of signal in a proximal right M2 branch (series 2, image 109), favored to represent stenosis and poor flow, given normal appearance on the prior exam. Distal MCA branches perfused and symmetric. Vertebral arteries patent to the vertebrobasilar junction without stenosis. Basilar patent to its distal aspect, with mild irregularity. Superior cerebellar arteries patent bilaterally. Bilateral P1 segments originate from the basilar artery. Focal loss of signal in the proximal right P2 segment (series 2, images 103-107), with additional moderate to severe stenosis in a right P3 (series 2, image 111), both of which are new from the prior exam. A more inferior right P3 branch is not well visualized, which appears unchanged. Mild-to-moderate narrowing in the left proximal P2 (series 2, image 102), which has progressed slightly from the prior exam. PCAs perfused to their distal aspects without stenosis. There are likely diminutive bilateral posterior communicating arteries. IMPRESSION: 1. Evaluation of the left carotid bifurcation is limited, likely secondary to the degree of calcification, although approximately 70% luminal narrowing is suspected in the proximal left ICA. There is also likely severe stenosis of the origin of the left ECA. Consider CTA for further evaluation if clinically indicated. 2. Approximately 60% narrowing of the proximal right ICA. 3. Focal loss of signal in the proximal right P2 segment, with additional moderate to severe stenosis in the right P3 segment, both of which are new from the prior exam. 4. Poor opacification of a proximal right M2 branch, likely stenosis and poor flow, given normal appearance on the prior exam. 5. Severe stenosis in the distal left M1 or proximal left M2, with additional moderate narrowing of the bilateral M1 segments. Electronically Signed   By: Merilyn Baba M.D.   On:  04/20/2021 23:25   MR ANGIO NECK WO CONTRAST  Result Date: 04/20/2021 CLINICAL DATA:  Stroke/TIA, determine embolic source EXAM: MRA NECK WITHOUT CONTRAST MRA HEAD WITHOUT CONTRAST TECHNIQUE: Angiographic images of the Circle of Willis were acquired using MRA technique without intravenous contrast. COMPARISON:  MRA 07/26/2017 trauma correlation is also made with MRI head 04/19/2021 FINDINGS: MRA NECK FINDINGS Two-vessel arch with a common origin of the brachiocephalic and left common carotid arteries. Imaged portion shows no evidence of aneurysm or dissection. No significant stenosis of the major arch vessel origins. Right common, internal, and external carotid arteries are patent, with approximately 60% narrowing of the right proximal ICA (series 4, image 54), likely secondary to calcified plaque. Left common carotid artery is patent. Poor evaluation of the bifurcation, likely secondary to the degree of calcified atherosclerosis. While this limits evaluation for stenosis, suspect approximately 70% luminal narrowing in the proximal left ICA. There is also likely severe stenosis at the origin of the left ECA. Extracranial vertebral arteries are patent, without hemodynamically significant stenosis. Possible mild narrowing of the proximal left V3 segment, although this is favored to be artifactual. MRA HEAD FINDINGS Both internal carotid arteries are patent to the termini, without stenosis or other abnormality. A1 segments patent. Normal anterior communicating artery. Anterior cerebral arteries are patent to their distal aspects. Mild-to-moderate narrowing of the right M1 (series 2, image 106), which has progressed since 2019. Moderate narrowing of the mid left M1 and severe narrowing in the distal left M1 or proximal M2 (series 2, image 111), both of which are new from the prior exam. Normal MCA bifurcations. Mild loss of signal in a proximal right M2 branch (series 2, image 109), favored to represent stenosis and  poor flow, given normal appearance on  the prior exam. Distal MCA branches perfused and symmetric. Vertebral arteries patent to the vertebrobasilar junction without stenosis. Basilar patent to its distal aspect, with mild irregularity. Superior cerebellar arteries patent bilaterally. Bilateral P1 segments originate from the basilar artery. Focal loss of signal in the proximal right P2 segment (series 2, images 103-107), with additional moderate to severe stenosis in a right P3 (series 2, image 111), both of which are new from the prior exam. A more inferior right P3 branch is not well visualized, which appears unchanged. Mild-to-moderate narrowing in the left proximal P2 (series 2, image 102), which has progressed slightly from the prior exam. PCAs perfused to their distal aspects without stenosis. There are likely diminutive bilateral posterior communicating arteries. IMPRESSION: 1. Evaluation of the left carotid bifurcation is limited, likely secondary to the degree of calcification, although approximately 70% luminal narrowing is suspected in the proximal left ICA. There is also likely severe stenosis of the origin of the left ECA. Consider CTA for further evaluation if clinically indicated. 2. Approximately 60% narrowing of the proximal right ICA. 3. Focal loss of signal in the proximal right P2 segment, with additional moderate to severe stenosis in the right P3 segment, both of which are new from the prior exam. 4. Poor opacification of a proximal right M2 branch, likely stenosis and poor flow, given normal appearance on the prior exam. 5. Severe stenosis in the distal left M1 or proximal left M2, with additional moderate narrowing of the bilateral M1 segments. Electronically Signed   By: Merilyn Baba M.D.   On: 04/20/2021 23:25   DG CHEST PORT 1 VIEW  Result Date: 04/21/2021 CLINICAL DATA:  Bilateral pleural effusions. EXAM: PORTABLE CHEST 1 VIEW COMPARISON:  Chest x-ray 04/19/2021 FINDINGS: The heart is  within normal limits in size and stable. Mild tortuosity and calcification of the thoracic aorta. Deviation of the trachea leftward due to right thyroid goiter and scoliosis. Persistent small bilateral pleural effusions, left larger than right with overlying bibasilar atelectasis. Mild central vascular congestion but no overt pulmonary edema. IMPRESSION: 1. Persistent small bilateral pleural effusions and overlying atelectasis. 2. Mild central vascular congestion without overt pulmonary edema. Electronically Signed   By: Marijo Sanes M.D.   On: 04/21/2021 08:23   EEG adult  Result Date: 04/21/2021 Lora Havens, MD     04/21/2021  8:22 AM Patient Name: Lisa Villegas MRN: [id number] Epilepsy Attending: Lora Havens Referring Physician/Provider: Lavina Hamman, MD Date: 04/20/2021 Duration: 25.58 mins Patient history: 88 year old female with syncope.  EEG to evaluate for seizures. Level of alertness: Awake AEDs during EEG study: None Technical aspects: This EEG study was done with scalp electrodes positioned according to the 10-20 International system of electrode placement. Electrical activity was acquired at a sampling rate of 500Hz  and reviewed with a high frequency filter of 70Hz  and a low frequency filter of 1Hz . EEG data were recorded continuously and digitally stored. Description: The posterior dominant rhythm consists of 8 Hz activity of moderate voltage (25-35 uV) seen predominantly in posterior head regions, symmetric and reactive to eye opening and eye closing.  EEG showed continuous 3 to 5 Hz theta -delta slowing in her right frontotemporal region.  Hyperventilation and photic stimulation were not performed.   ABNORMALITY -Continuous slow, right frontotemporal region IMPRESSION: This study is suggestive of cortical dysfunction in right frontotemporal region likely secondary to underlying stroke.  No seizures or epileptiform discharges were seen throughout the recording. [place]    ECHOCARDIOGRAM  COMPLETE  Result Date: 04/21/2021    ECHOCARDIOGRAM REPORT   Patient Name:   MARYLON VERNO Date of Exam: 04/21/2021 Medical Rec #:  [id number]          Height:       65.0 in Accession #:    [id number]         Weight:       128.7 lb Date of Birth:  Oct 23, 1930          BSA:          1.640 m Patient Age:    25 years           BP:           178/80 mmHg Patient Gender: F                  HR:           93 bpm. Exam Location:  Inpatient Procedure: 2D Echo Indications:    Stroke  History:        Patient has prior history of Echocardiogram examinations, most                 recent 07/26/2017. CHF, Stroke; Risk Factors:Diabetes and                 Hypertension.  Sonographer:    Arlyss Gandy Referring Phys: [id number] [place]  1. Left ventricular ejection fraction, by estimation, is 60 to 65%. The left ventricle has normal function. The left ventricle has no regional wall motion abnormalities. There is mild left ventricular hypertrophy. Left ventricular diastolic parameters are consistent with Grade I diastolic dysfunction (impaired relaxation).  2. Right ventricular systolic function is moderately reduced. The right ventricular size is mildly enlarged. There is moderately elevated pulmonary artery systolic pressure. The estimated right ventricular systolic pressure is 93.2 mmHg.  3. The mitral valve is degenerative. Trivial mitral valve regurgitation. No evidence of mitral stenosis. Moderate mitral annular calcification.  4. The aortic valve is tricuspid. Aortic valve regurgitation is mild. Aortic valve sclerosis/calcification is present, without any evidence of aortic stenosis. Aortic valve mean gradient measures 8.0 mmHg.  5. The inferior vena cava is dilated in size with <50% respiratory variability, suggesting right atrial pressure of 15 mmHg. FINDINGS  Left Ventricle: Left ventricular ejection fraction, by estimation, is 60 to 65%. The left ventricle has normal function. The left  ventricle has no regional wall motion abnormalities. The left ventricular internal cavity size was normal in size. There is  mild left ventricular hypertrophy. Left ventricular diastolic parameters are consistent with Grade I diastolic dysfunction (impaired relaxation). Right Ventricle: The right ventricular size is mildly enlarged. No increase in right ventricular wall thickness. Right ventricular systolic function is moderately reduced. There is moderately elevated pulmonary artery systolic pressure. The tricuspid regurgitant velocity is 2.76 m/s, and with an assumed right atrial pressure of 15 mmHg, the estimated right ventricular systolic pressure is 35.5 mmHg. Left Atrium: Left atrial size was normal in size. Right Atrium: Right atrial size was normal in size. Pericardium: There is no evidence of pericardial effusion. Mitral Valve: The mitral valve is degenerative in appearance. There is mild calcification of the mitral valve leaflet(s). Moderate mitral annular calcification. Trivial mitral valve regurgitation. No evidence of mitral valve stenosis. MV peak gradient, 7.0 mmHg. The mean mitral valve gradient is 3.0 mmHg. Tricuspid Valve: The tricuspid valve is normal in structure. Tricuspid valve regurgitation is trivial. Aortic Valve: The aortic valve is tricuspid.  Aortic valve regurgitation is mild. Aortic regurgitation PHT measures 421 msec. Aortic valve sclerosis/calcification is present, without any evidence of aortic stenosis. Aortic valve mean gradient measures 8.0  mmHg. Aortic valve peak gradient measures 14.0 mmHg. Aortic valve area, by VTI measures 1.04 cm. Pulmonic Valve: The pulmonic valve was normal in structure. Pulmonic valve regurgitation is not visualized. Aorta: The aortic root is normal in size and structure. Venous: The inferior vena cava is dilated in size with less than 50% respiratory variability, suggesting right atrial pressure of 15 mmHg. IAS/Shunts: No atrial level shunt detected by  color flow Doppler.  LEFT VENTRICLE PLAX 2D LVIDd:         3.60 cm   Diastology LVIDs:         2.55 cm   LV e' medial:    4.35 cm/s LV PW:         1.00 cm   LV E/e' medial:  13.2 LV IVS:        1.00 cm   LV e' lateral:   3.92 cm/s LVOT diam:     1.60 cm   LV E/e' lateral: 14.6 LV SV:         30 LV SV Index:   18 LVOT Area:     2.01 cm  RIGHT VENTRICLE            IVC RV Basal diam:  4.20 cm    IVC diam: 2.20 cm RV Mid diam:    3.70 cm RV S prime:     8.81 cm/s TAPSE (M-mode): 1.3 cm LEFT ATRIUM           Index        RIGHT ATRIUM           Index LA diam:      2.70 cm 1.65 cm/m   RA Area:     17.20 cm LA Vol (A2C): 26.6 ml 16.22 ml/m  RA Volume:   49.40 ml  30.11 ml/m LA Vol (A4C): 44.4 ml 27.07 ml/m  AORTIC VALVE AV Area (Vmax):    1.03 cm AV Area (Vmean):   1.03 cm AV Area (VTI):     1.04 cm AV Vmax:           187.00 cm/s AV Vmean:          125.500 cm/s AV VTI:            0.287 m AV Peak Grad:      14.0 mmHg AV Mean Grad:      8.0 mmHg LVOT Vmax:         95.50 cm/s LVOT Vmean:        64.300 cm/s LVOT VTI:          0.149 m LVOT/AV VTI ratio: 0.52 AI PHT:            421 msec  AORTA Ao Root diam: 2.50 cm Ao Asc diam:  2.80 cm MITRAL VALVE                TRICUSPID VALVE MV Area (PHT): 4.36 cm     TR Peak grad:   30.5 mmHg MV Area VTI:   1.63 cm     TR Vmax:        276.00 cm/s MV Peak grad:  7.0 mmHg MV Mean grad:  3.0 mmHg     SHUNTS MV Vmax:       1.32 m/s     Systemic VTI:  0.15 m MV Vmean:  70.4 cm/s    Systemic Diam: 1.60 cm MV Decel Time: 174 msec MV E velocity: 57.40 cm/s MV A velocity: 133.00 cm/s MV E/A ratio:  0.43 Dalton McleanMD Electronically signed by Franki Monte Signature Date/Time: 04/21/2021/3:36:28 PM    Final    VAS US CAROTID  Result Date: 04/22/2021 Carotid Arterial Duplex Study Patient Name:  Lisa Villegas  Date of Exam:   04/21/2021 Medical Rec #: [id number]           Accession #:    [id number] Date of Birth: [birth year]           Patient Gender: F Patient Age:   33 years  Exam [facility]  [facility] Procedure:      VAS US CAROTID Referring Phys: Oren Binet --------------------------------------------------------------------------------  Indications:       CVA. Risk Factors:      Hypertension, hyperlipidemia, Diabetes. Limitations        Today's exam was limited due to the patient's inability or                    unwillingness to cooperate. Comparison Study:  07/27/2017 Carotid artery duplex- bilateral 1-39% ICA stenosis. Performing Technologist: Maudry Mayhew MHA, RDMS, RVT, RDCS  Examination Guidelines: A complete evaluation includes B-mode imaging, spectral Doppler, color Doppler, and power Doppler as needed of all accessible portions of each vessel. Bilateral testing is considered an integral part of a complete examination. Limited examinations for reoccurring indications may be performed as noted.  Right Carotid Findings: +----------+--------+--------+--------+---------------------+------------------+             PSV cm/s EDV cm/s Stenosis Plaque Description    Comments            +----------+--------+--------+--------+---------------------+------------------+  CCA Prox   58       9                 heterogenous,                                                                    irregular and                                                                    calcific                                  +----------+--------+--------+--------+---------------------+------------------+  CCA Distal 42       7                 heterogenous, smooth                                                             and calcific                              +----------+--------+--------+--------+---------------------+------------------+  ICA Prox   153      49       40-59%   heterogenous,                                                                    irregular and                                                                    calcific                                   +----------+--------+--------+--------+---------------------+------------------+  ICA Mid                                                     tortuous, unable                                                                 to Doppler          +----------+--------+--------+--------+---------------------+------------------+  ICA Distal 68       18                                      tortuous            +----------+--------+--------+--------+---------------------+------------------+  ECA        83       22                heterogenous and                                                                 calcific                                  +----------+--------+--------+--------+---------------------+------------------+ +----------+--------+-------+----------------+-------------------+             PSV cm/s EDV cms Describe         Arm Pressure (mmHG)  +----------+--------+-------+----------------+-------------------+  Subclavian 136              Multiphasic, WNL                      +----------+--------+-------+----------------+-------------------+ +---------+--------+--+--------+--+---------+  Vertebral PSV cm/s 89 EDV cm/s 24  Antegrade  +---------+--------+--+--------+--+---------+  Left Carotid Findings: +----------+--------+-------+--------+--------------------------------+--------+             PSV cm/s EDV     Stenosis Plaque Description               Comments                       cm/s                                                        +----------+--------+-------+--------+--------------------------------+--------+  CCA Prox   74       14               smooth and heterogenous                    +----------+--------+-------+--------+--------------------------------+--------+  CCA Distal 63       10               heterogenous and irregular                 +----------+--------+-------+--------+--------------------------------+--------+  ICA Prox   69       17      1-39%    smooth, heterogenous and                                                          calcific                                   +----------+--------+-------+--------+--------------------------------+--------+  ICA Distal 100      16                                                          +----------+--------+-------+--------+--------------------------------+--------+  ECA        94       9                                                           +----------+--------+-------+--------+--------------------------------+--------+ +----------+--------+--------+----------------+-------------------+             PSV cm/s EDV cm/s Describe         Arm Pressure (mmHG)  +----------+--------+--------+----------------+-------------------+  Subclavian 148               Multiphasic, WNL                      +----------+--------+--------+----------------+-------------------+ +---------+--------+--+--------+--+---------+  Vertebral PSV cm/s 62 EDV cm/s 14 Antegrade  +---------+--------+--+--------+--+---------+   Summary: Right Carotid: Velocities in the right ICA are consistent with a 40-59%                stenosis. Left Carotid: Velocities in  the left ICA are consistent with a 1-39% stenosis. Vertebrals:  Bilateral vertebral arteries demonstrate antegrade flow. Subclavians: Normal flow hemodynamics were seen in bilateral subclavian              arteries. *See table(s) above for measurements and observations.  Electronically signed by Antony Contras MD on 04/22/2021 at 8:17:16 AM.    Final    VAS [place] LOWER EXTREMITY VENOUS (DVT)  Result Date: 04/21/2021  Lower Venous DVT Study Patient Name:  RAJVI ARMENTOR  Date of Exam:   04/21/2021 Medical Rec #: [id number]           Accession #:    [id number] Date of Birth: February 05, 1931           Patient Gender: F Patient Age:   47 years Exam Location:  [facility] Procedure:      VAS [place] LOWER EXTREMITY VENOUS (DVT) Referring Phys: Cornelius Moras XU --------------------------------------------------------------------------------   Indications: Stroke.  Comparison Study: No prior study Performing Technologist: Maudry Mayhew MHA, RDMS, RVT, RDCS  Examination Guidelines: A complete evaluation includes B-mode imaging, spectral Doppler, color Doppler, and power Doppler as needed of all accessible portions of each vessel. Bilateral testing is considered an integral part of a complete examination. Limited examinations for reoccurring indications may be performed as noted. The reflux portion of the exam is performed with the patient in reverse Trendelenburg.  +---------+---------------+---------+-----------+----------+-----------------+  RIGHT     Compressibility Phasicity Spontaneity Properties Thrombus Aging     +---------+---------------+---------+-----------+----------+-----------------+  CFV       None                      No                     Age Indeterminate  +---------+---------------+---------+-----------+----------+-----------------+  FV Prox   None                                             Age Indeterminate  +---------+---------------+---------+-----------+----------+-----------------+  FV Mid    None                                             Age Indeterminate  +---------+---------------+---------+-----------+----------+-----------------+  FV Distal None                                             Age Indeterminate  +---------+---------------+---------+-----------+----------+-----------------+  PFV       None                                             Age Indeterminate  +---------+---------------+---------+-----------+----------+-----------------+  POP       None                      No                     Age Indeterminate  +---------+---------------+---------+-----------+----------+-----------------+  PTV       None  No                     Age Indeterminate  +---------+---------------+---------+-----------+----------+-----------------+  PERO      None                      No                     Age  Indeterminate  +---------+---------------+---------+-----------+----------+-----------------+  EIV                       No        Yes                                       +---------+---------------+---------+-----------+----------+-----------------+   Right Technical Findings: Not visualized segments include Right common iliac vein, IVC.  +---------+---------------+---------+-----------+----------+--------------+  LEFT      Compressibility Phasicity Spontaneity Properties Thrombus Aging  +---------+---------------+---------+-----------+----------+--------------+  CFV       Full            No        Yes                                    +---------+---------------+---------+-----------+----------+--------------+  SFJ       Full                                                             +---------+---------------+---------+-----------+----------+--------------+  FV Prox   Full                                                             +---------+---------------+---------+-----------+----------+--------------+  FV Mid    Full                                                             +---------+---------------+---------+-----------+----------+--------------+  FV Distal Full                                                             +---------+---------------+---------+-----------+----------+--------------+  PFV       Full                                                             +---------+---------------+---------+-----------+----------+--------------+  POP       Full  No        Yes                                    +---------+---------------+---------+-----------+----------+--------------+  PTV       None                                             Acute           +---------+---------------+---------+-----------+----------+--------------+  PERO      None                                             Acute           +---------+---------------+---------+-----------+----------+--------------+      Summary: RIGHT: - Findings consistent with age indeterminate deep vein thrombosis involving the right common femoral vein, right femoral vein, right proximal profunda vein, right popliteal vein, right posterior tibial veins, and right peroneal veins. - No cystic structure found in the popliteal fossa. - Right external iliac vein is patent with pulsatile flow, suggestive of possibly elevated right heart pressure.  LEFT: - Findings consistent with acute deep vein thrombosis involving the left posterior tibial veins, and left peroneal veins. - No cystic structure found in the popliteal fossa. - Pulsatile flow noted, suggestive of possibly elevated right heart pressure.  *See table(s) above for measurements and observations. Electronically signed by Harold Barban MD on 04/21/2021 at 9:15:37 PM.    Final      LOS: 3 days   Oren Binet, MD  [facility]    To contact the attending provider between 7A-7P or the covering provider during after hours 7P-7A, please log into the web site [email or web address] and access using universal [place] password for that web site. If you do not have the password, please call the hospital operator.  04/22/2021, 2:56 PM

## 2021-04-22 NOTE — Assessment & Plan Note (Addendum)
Likely provoked due to sedentary lifestyle/immobility due to recent fracture-given the fact that she syncopized-and has mild RV systolic dysfunction-likely has a PE as well.  However she is hemodynamically stable-not hypoxic-further work-up will not change management-as she is already on IV heparin-hence will not pursue CTA chest.  Will probably require indefinite anticoagulation.  This MD discussed with POA-Ms. [name] 1/26-she was understanding and agreeable to this strategy.

## 2021-04-22 NOTE — Progress Notes (Signed)
Occupational Therapy Treatment Patient Details Name: Lisa Villegas MRN: [id number] DOB: 1931/03/28 Today's Date: 04/22/2021   History of present illness Lisa Villegas is a 88 y.o. female Presenting with syncope from ALF on 04/19/21. MRI-Moderately large acute to early subacute posterior right MCA  infarct. PMH: chronic diastolic HF, CBS4H, DM2, HTN, HLD.autism, recent R metatarsal fx   OT comments  Pt continuing to refuse further mobility. Pt able to come to sitting with max A due to difficulty following commands, then able to quickly return herself to supine, stating "no" when asked to work on standing and ambulating. OT will continue to follow and work towards progressing mobility and participation.    Recommendations for follow up therapy are one component of a multi-disciplinary discharge planning process, led by the attending physician.  Recommendations may be updated based on patient status, additional functional criteria and insurance authorization.    Follow Up Recommendations  Other (comment) (Return to ALF)    Assistance Recommended at Discharge Frequent or constant Supervision/Assistance  Patient can return home with the following  A lot of help with bathing/dressing/bathroom;Two people to help with walking and/or transfers   Equipment Recommendations  None recommended by OT    Recommendations for Other Services      Precautions / Restrictions Precautions Precautions: Fall Precaution Comments: Autistic, Difficulty following directions Required Braces or Orthoses: Splint/Cast Splint/Cast: post op shoe on right foot Restrictions Weight Bearing Restrictions: No RLE Weight Bearing: Weight bearing as tolerated       Mobility Bed Mobility Overal bed mobility: Needs Assistance Bed Mobility: Supine to Sit, Sit to Supine     Supine to sit: Max assist Sit to supine: Min guard   General bed mobility comments: Needing increased assist due to pt resisting mobility,  when returning to supine, pt needing no assist    Transfers                   General transfer comment: Deferred due to pt repeatedly stating "no"     Balance Overall balance assessment: Needs assistance Sitting-balance support: No upper extremity supported Sitting balance-Leahy Scale: Fair Sitting balance - Comments: No difficulties in sitting, balance not challenged                                   ADL either performed or assessed with clinical judgement   ADL Overall ADL's : Needs assistance/impaired                                       General ADL Comments: Session focused on bed mobility    Extremity/Trunk Assessment              Vision       Perception     Praxis      Cognition Arousal/Alertness: Lethargic Behavior During Therapy: Flat affect Overall Cognitive Status: Difficult to assess                                 General Comments: Does follow simple directions with max cues and assistance of 2. More resistive to mobility efforts.        Exercises      Shoulder Instructions       General Comments VSS on RA, pt appears  pale, NT in room to double check vitals    Pertinent Vitals/ Pain       Pain Assessment Pain Assessment: No/denies pain  Home Living                                          Prior Functioning/Environment              Frequency  Min 2X/week        Progress Toward Goals  OT Goals(current goals can now be found in the care plan section)  Progress towards OT goals: Not progressing toward goals - comment (Pt refusing further mobility at this time)  Acute Rehab OT Goals Patient Stated Goal: none stated OT Goal Formulation: Patient unable to participate in goal setting Time For Goal Achievement: 05/04/21 Potential to Achieve Goals: Fair ADL Goals Pt Will Transfer to Toilet: ambulating;with min assist;regular height toilet;grab bars Pt Will  Perform Toileting - Clothing Manipulation and hygiene: with max assist;sit to/from stand Additional ADL Goal #1: Patient will participate in 5 minutes of OOB activity in order to participate in daily routine.  Plan Discharge plan remains appropriate;Frequency remains appropriate    Co-evaluation                 AM-PAC OT "6 Clicks" Daily Activity     Outcome Measure   Help from another person eating meals?: A Little Help from another person taking care of personal grooming?: A Little Help from another person toileting, which includes using toliet, bedpan, or urinal?: Total Help from another person bathing (including washing, rinsing, drying)?: A Lot Help from another person to put on and taking off regular upper body clothing?: A Lot Help from another person to put on and taking off regular lower body clothing?: Total 6 Click Score: 12    End of Session Equipment Utilized During Treatment: Gait belt  OT Visit Diagnosis: Other symptoms and signs involving cognitive function   Activity Tolerance Patient limited by lethargy   Patient Left in bed;with call bell/phone within reach;with bed alarm set   Nurse Communication Mobility status        Time: 1459-1511 OT Time Calculation (min): 12 min  Charges: OT General Charges $OT Visit: 1 Visit OT Treatments $Therapeutic Activity: 8-22 mins  Theressa Piedra H., OTR/L Acute Rehabilitation  Myrakle Wingler Elane Yolanda Bonine 04/22/2021, 4:50 PM
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
## 2021-04-22 NOTE — Plan of Care (Signed)
  Problem: Nutrition: Goal: Adequate nutrition will be maintained Outcome: Progressing   Problem: Safety: Goal: Ability to remain free from injury will improve Outcome: Progressing   Problem: Skin Integrity: Goal: Risk for impaired skin integrity will decrease Outcome: Progressing   

## 2021-04-22 NOTE — Progress Notes (Signed)
Afternoon dose of Hydrazaline HCL was held due to  111/49. On coming RN and MD notified of same.

## 2021-04-22 NOTE — Care Management Important Message (Signed)
Important Message  Patient Details  Name: Lisa Villegas MRN: [id number] Date of Birth: 10-28-1930   Medicare Important Message Given:  Yes     Hannah Beat 04/22/2021, 1:25 PM

## 2021-04-22 NOTE — Plan of Care (Signed)
°  Problem: Coping: Goal: Level of anxiety will decrease Outcome: Progressing   Problem: Elimination: Goal: Will not experience complications related to bowel motility Outcome: Progressing   Problem: Safety: Goal: Ability to remain free from injury will improve Outcome: Progressing   Problem: Skin Integrity: Goal: Risk for impaired skin integrity will decrease Outcome: Progressing

## 2021-04-22 NOTE — Progress Notes (Signed)
ANTICOAGULATION CONSULT NOTE  Pharmacy Consult for Heparin Indication: DVT  Allergies  Allergen Reactions   Ciprofloxacin Rash and Other (See Comments)    Localized red rash to IV site; "Allergic," per MAR   Penicillins Rash and Other (See Comments)    Tolerates multiple cephalosporins, including cephalexin; "Allergic to Penicillin," per [facility]    Patient Measurements: Heparin Dosing Weight: TBW (58 kg)  Vital Signs: Temp: 98 F (36.7 C) (01/26 0350) Temp Source: Oral (01/26 0350) BP: 135/69 (01/26 0350) Pulse Rate: 82 (01/26 0350)  Labs: Recent Labs    04/19/21 1311 04/19/21 1314 04/20/21 0534 04/21/21 0421 04/21/21 2130 04/22/21 0508 04/22/21 0748  HGB  --    < > 8.7* 10.7*  --  9.4*  --   HCT  --    < > 27.9* 34.0*  --  29.4*  --   PLT  --    < > 280 340  --  324  --   APTT 23*  --   --   --   --   --   --   LABPROT 14.3  --  14.6  --   --   --   --   INR 1.1  --  1.1  --   --   --   --   HEPARINUNFRC  --   --   --   --  0.72*  --  0.40  CREATININE  --    < > 0.98 0.95  --  1.25*  --    < > = values in this interval not displayed.    Estimated Creatinine Clearance: 28 mL/min (A) (by C-G formula based on SCr of 1.25 mg/dL (H)).   Assessment: 88 year old female with medical history significant for prior CVA and HFpEF who presented with syncope/confusion found to have acute MCA territory infarct and age indeterminate DVTs. Pharmacy consulted to manage heparin infusion.  Per stroke protocol, no bolus and lower goal range of 0.3-0.5. Previous heparin level therapeutic at 0.4. Current CBC stable, no s/sx bleeding reported.   Goal of Therapy:  Heparin level 0.3-0.5 units/ml Monitor platelets by anticoagulation protocol: Yes   Plan: Continue heparin infusion at 800 units/hr Check heparin level in 8 hours and daily while on heparin Continue to monitor H&H and platelets    Thank you for allowing pharmacy to be a part of this patients care.  Ardyth Harps,  PharmD Clinical Pharmacist

## 2021-04-22 NOTE — Progress Notes (Addendum)
STROKE TEAM PROGRESS NOTE   INTERVAL HISTORY Patient is seen in her room with her guardian [name] at the bedside.  [name] states that patient responds well to being called [name] and to being told that the exam is a test.  According to [facility], patient was at her baseline two weeks ago and was able to play board games and do complex math problems.  Vitals:   04/22/21 0350 04/22/21 0800 04/22/21 1004 04/22/21 1227  BP: 135/69  138/82 (!) 132/49  Pulse: 82   77  Resp: 20   20  Temp: 98 F (36.7 C)   97.6 F (36.4 C)  TempSrc: Oral   Oral  SpO2: 92% 95%  93%  Weight:       CBC:  Recent Labs  Lab 04/21/21 0421 04/22/21 0508  WBC 9.1 9.0  NEUTROABS 6.7 6.0  HGB 10.7* 9.4*  HCT 34.0* 29.4*  MCV 86.1 85.7  PLT 340 376    Basic Metabolic Panel:  Recent Labs  Lab 04/21/21 0421 04/22/21 0508  NA 132* 135  K 4.0 3.6  CL 102 106  CO2 18* 20*  GLUCOSE 120* 122*  BUN 13 19  CREATININE 0.95 1.25*  CALCIUM 8.8* 8.4*  MG 1.4* 1.3*    Lipid Panel:  Recent Labs  Lab 04/20/21 0534  CHOL 211*  TRIG 119  HDL 53  CHOLHDL 4.0  VLDL 24  LDLCALC 134*    HgbA1c:  Recent Labs  Lab 04/20/21 0534  HGBA1C 6.8*    Urine Drug Screen: No results for input(s): LABOPIA, COCAINSCRNUR, LABBENZ, AMPHETMU, THCU, LABBARB in the last 168 hours.  Alcohol Level No results for input(s): ETH in the last 168 hours.  IMAGING past 24 hours No results found.  PHYSICAL EXAM General:  Alert, thin appearing female in no acute distress.  Swelling and bruising noted to right foot   NEURO:  Mental Status: AA&Ox2 Speech/Language: speech is without dysarthria or aphasia but slow to respond  Cranial Nerves:  II: PERRL.   III, IV, VI: EOMI. Eyelids elevate symmetrically.  VII: Smile is symmetrical. VIII: hearing intact to voice. IX, X: Phonation is normal.  XII: tongue is midline without fasciculations. Motor: 5/5 strength to RUE, LLE and RLE, 4+/5 strength to LUE Tone: is normal and bulk  is normal Sensation- Intact to light touch bilaterally. Gait- deferred Coordiantion: FNF intact bilaterally   ASSESSMENT/PLAN Lisa Villegas is a 88 y.o. female with history of HTN, dCHF, T2DM, HLD, autism and CKD presenting with hypoxia and acute weakness. Patient was in her ALF and was walking with PT when she had acute onset of weakness and collapsed.  SPO2 at that time was 80% and rose with administration of supplemental o2.  She was then taken to the ED and found to have a large right MCA stroke.  Stroke:  right MCA infarct of temporpartietal area, etiology unclear, but  likely secondary cardioembolic source vs. Large vessel disease  Code Stroke CT head New acute/subacute right temporoparietal infarct   MRI  moderately large acute to early subacute posterior right MCA infarct MRA  70% narrowing of left proximal ICA, 60% narrowing of right proximal ICA, stenosis of right P3 segment, poor opacification of proximal right M2 branch, severe stenosis in distal left M1 Carotid Doppler  right ICA 40-59% stenosis, left ICA 1-39% stenosis 2D Echo EF 60 to 65%, moderate PHTN Lower extremity ultrasound DVT in right common femoral vein, proximal profunda vein, popliteal vein , posterior tibial  and peroneal veins, acute DVT in left posterior tibial and peroneal veins LDL 134 HgbA1c 6.8 VTE prophylaxis - fully anticoagulated with heparin aspirin 81 mg daily prior to admission, now on heparin IV. If tolerating well, will switch to eliquis tomorrow. Therapy recommendations:  SNF Disposition:  pending  Hypertension Home meds:  metoprolol 12.5 mg BID Stable Long-term BP goal normotensive  Hyperlipidemia Home meds:  none LDL 134, goal < 70 Add atorvastatin 40 mg daily Continue statin at discharge  Diabetes type II Controlled Home meds:  insulin glargine 12 units SQ daily, metformin 1000 mg Q AM and 500 mg QHS HgbA1c 6.8, goal < 7.0 CBGs SSI  DVT ? PE Lower extremity ultrasound  shows bilateral DVTs Came in with hypoxia and hypotension, TTE showed moderate PHTN, concerning for possible PE. However, pt currently stable clinically, no need further work-up for PE given not changing management. Anticoagulate with heparin  Change to PO anticoagulation tomorrow if tolerating heparin IV.  Other Stroke Risk Factors Advanced Age >/= 48  Congestive heart failure  Other Active Problems CKD Avoid contrast when possible Renally dose medications as appropriate  Hospital day # [place] , MSN, AGACNP-BC [facility] See Amion for schedule and pager information 04/22/2021 12:38 PM   ATTENDING NOTE: I reviewed above note and agree with the assessment and plan. Pt was seen and examined.   No family at bedside.  Patient reclining in bed, having lunch.  Neuro stable, unchanged from yesterday.  Still on heparin IV, currently tolerating well.  EF 60 to 65%, moderate PHTN.  Patient came in with cyanosis, hypoxic, collapse at home.  With finding of DVT and moderate right heart strain, concerning for possible PE.  However, no need to further work-up given not changing management.  Continue heparin IV for today.  If tolerating well, may consider switch to Eliquis tomorrow.  Continue statin.  PT/OT recommend SNF.  Will follow  For detailed assessment and plan, please refer to above as I have made changes wherever appropriate.   Rosalin Hawking, MD PhD Stroke Neurology 04/22/2021 4:18 PM    To contact Stroke Continuity provider, please refer to [email or web address] After hours, contact General Neurology

## 2021-04-23 DIAGNOSIS — E43 Unspecified severe protein-calorie malnutrition: Secondary | ICD-10-CM

## 2021-04-23 LAB — GLUCOSE, CAPILLARY
Glucose-Capillary: 123 mg/dL — ABNORMAL HIGH (ref 70–99)
Glucose-Capillary: 122 mg/dL — ABNORMAL HIGH (ref 70–99)
Glucose-Capillary: 267 mg/dL — ABNORMAL HIGH (ref 70–99)
Glucose-Capillary: 161 mg/dL — ABNORMAL HIGH (ref 70–99)
Glucose-Capillary: 82 mg/dL (ref 70–99)
Glucose-Capillary: 118 mg/dL — ABNORMAL HIGH (ref 70–99)

## 2021-04-23 LAB — BASIC METABOLIC PANEL
Anion gap: 10 (ref 5–15)
BUN: 21 mg/dL (ref 8–23)
CO2: 18 mmol/L — ABNORMAL LOW (ref 22–32)
Calcium: 8.1 mg/dL — ABNORMAL LOW (ref 8.9–10.3)
Chloride: 106 mmol/L (ref 98–111)
Creatinine, Ser: 1.3 mg/dL — ABNORMAL HIGH (ref 0.44–1.00)
GFR, Estimated: 40 mL/min — ABNORMAL LOW (ref >60)
Glucose, Bld: 121 mg/dL — ABNORMAL HIGH (ref 70–99)
Potassium: 3.6 mmol/L (ref 3.5–5.1)
Sodium: 134 mmol/L — ABNORMAL LOW (ref 135–145)

## 2021-04-23 LAB — CBC WITH DIFFERENTIAL/PLATELET
Abs Immature Granulocytes: 0.28 10*3/uL — ABNORMAL HIGH (ref 0.00–0.07)
Basophils Absolute: 0.1 10*3/uL (ref 0.0–0.1)
Basophils Relative: 1 %
Eosinophils Absolute: 0.3 10*3/uL (ref 0.0–0.5)
Eosinophils Relative: 3 %
HCT: 29.3 % — ABNORMAL LOW (ref 36.0–46.0)
Hemoglobin: 9.1 g/dL — ABNORMAL LOW (ref 12.0–15.0)
Immature Granulocytes: 3 %
Lymphocytes Relative: 24 %
Lymphs Abs: 2 10*3/uL (ref 0.7–4.0)
MCH: 26.8 pg (ref 26.0–34.0)
MCHC: 31.1 g/dL (ref 30.0–36.0)
MCV: 86.4 fL (ref 80.0–100.0)
Monocytes Absolute: 0.6 10*3/uL (ref 0.1–1.0)
Monocytes Relative: 8 %
Neutro Abs: 5.1 10*3/uL (ref 1.7–7.7)
Neutrophils Relative %: 61 %
Platelets: 330 10*3/uL (ref 150–400)
RBC: 3.39 MIL/uL — ABNORMAL LOW (ref 3.87–5.11)
RDW: 16.1 % — ABNORMAL HIGH (ref 11.5–15.5)
WBC: 8.3 10*3/uL (ref 4.0–10.5)
nRBC: 0 % (ref 0.0–0.2)

## 2021-04-23 LAB — MAGNESIUM: Magnesium: 1.3 mg/dL — ABNORMAL LOW (ref 1.7–2.4)

## 2021-04-23 LAB — HEPARIN LEVEL (UNFRACTIONATED): Heparin Unfractionated: 0.31 IU/mL (ref 0.30–0.70)

## 2021-04-23 NOTE — Progress Notes (Signed)
STROKE TEAM PROGRESS NOTE   INTERVAL HISTORY No family at bedside.  Patient sitting bed for lunch, still has paucity of speech, perseveration and left hemianopia versus neglect.  Tolerating heparin IV, will transition to Eliquis tonight.  Vitals:   04/23/21 0739 04/23/21 1020 04/23/21 1137 04/23/21 1524  BP: (!) 135/54 131/69 122/81 (!) 132/57  Pulse: 73 82 76 80  Resp: 20  20   Temp: 97.8 F (36.6 C)  (!) 97.4 F (36.3 C) 98.5 F (36.9 C)  TempSrc: Oral  Oral Oral  SpO2: 94%  97% 92%  Weight:       CBC:  Recent Labs  Lab 04/22/21 0508 04/23/21 0225  WBC 9.0 8.3  NEUTROABS 6.0 5.1  HGB 9.4* 9.1*  HCT 29.4* 29.3*  MCV 85.7 86.4  PLT 324 268   Basic Metabolic Panel:  Recent Labs  Lab 04/22/21 0508 04/23/21 0225  NA 135 134*  K 3.6 3.6  CL 106 106  CO2 20* 18*  GLUCOSE 122* 121*  BUN 19 21  CREATININE 1.25* 1.30*  CALCIUM 8.4* 8.1*  MG 1.3* 1.3*   Lipid Panel:  Recent Labs  Lab 04/20/21 0534  CHOL 211*  TRIG 119  HDL 53  CHOLHDL 4.0  VLDL 24  LDLCALC 134*   HgbA1c:  Recent Labs  Lab 04/20/21 0534  HGBA1C 6.8*   Urine Drug Screen: No results for input(s): LABOPIA, COCAINSCRNUR, LABBENZ, AMPHETMU, THCU, LABBARB in the last 168 hours.  Alcohol Level No results for input(s): ETH in the last 168 hours.  IMAGING past 24 hours No results found.  PHYSICAL EXAM General:  Alert, thin appearing female in no acute distress.  Swelling and bruising noted to right foot   NEURO:  awake, alert, eyes open, orientated to self and age, but not to time or place. Able to say her name, age, "OK", count fingers, but most answer fixed on "No" with perseveration. Only open and close eyes on command, but not following other simple commands. Able to name 2/4 but not cooperative with repeating. No gaze palsy, tracking bilaterally, blinking to visual threat bilaterally.  Mild left nasolabial fold flattening.. Tongue protrusion not corporative.  Bilateral UEs 3/5, no drift.  Bilaterally LEs 3-/5, symmetrical. Sensation and coordination not corporative, gait not tested.    ASSESSMENT/PLAN [name] is a 88 y.o. female with history of HTN, dCHF, T2DM, HLD, autism and CKD presenting with hypoxia and acute weakness. Patient was in her ALF and was walking with PT when she had acute onset of weakness and collapsed.  SPO2 at that time was 80% and rose with administration of supplemental o2.  She was then taken to the ED and found to have a large right MCA stroke.  Stroke:  right MCA infarct of temporpartietal area, etiology unclear, but  likely secondary cardioembolic source vs. Large vessel disease  Code Stroke CT head New acute/subacute right temporoparietal infarct   MRI  moderately large acute to early subacute posterior right MCA infarct MRA  70% narrowing of left proximal ICA, 60% narrowing of right proximal ICA, stenosis of right P3 segment, poor opacification of proximal right M2 branch, severe stenosis in distal left M1 Carotid Doppler  right ICA 40-59% stenosis, left ICA 1-39% stenosis 2D Echo EF 60 to 65%, moderate PHTN Lower extremity ultrasound DVT in right common femoral vein, proximal profunda vein, popliteal vein , posterior tibial and peroneal veins, acute DVT in left posterior tibial and peroneal veins LDL 134 HgbA1c 6.8 VTE  prophylaxis - fully anticoagulated with heparin aspirin 81 mg daily prior to admission, now on heparin IV. will switch to eliquis tonight. Therapy recommendations:  SNF Disposition:  pending  Hypertension Home meds:  metoprolol 12.5 mg BID Stable Long-term BP goal normotensive  Hyperlipidemia Home meds:  none LDL 134, goal < 70 Add atorvastatin 40 mg daily Continue statin at discharge  Diabetes type II Controlled Home meds:  insulin glargine 12 units SQ daily, metformin 1000 mg Q AM and 500 mg QHS HgbA1c 6.8, goal < 7.0 CBGs SSI  DVT ? PE Lower extremity ultrasound shows bilateral DVTs Came in with  hypoxia and hypotension, TTE showed moderate PHTN, concerning for possible PE. However, pt currently stable clinically, no need further work-up for PE given not changing management. Switching heparin IV to Eliquis tonight. Patient likely need long-term anticoagulation given extensive DVT, possible PE and embolic strokes  Other Stroke Risk Factors Advanced Age >/= 29  Congestive heart failure  Other Active Problems AKI on CKD, creatinine 0.95-1.25-1.30 Avoid contrast when possible Renally dose medications as appropriate  Hospital day # 4  Neurology will sign off. Please call with questions. Pt will follow up with stroke clinic NP at [facility] in about 4 weeks. Thanks for the consult.   Rosalin Hawking, MD PhD Stroke Neurology 04/23/2021 6:03 PM    To contact Stroke Continuity provider, please refer to [email or web address] After hours, contact General Neurology

## 2021-04-23 NOTE — Progress Notes (Signed)
ANTICOAGULATION CONSULT NOTE  Pharmacy Consult for Heparin Indication: DVT  Allergies  Allergen Reactions   Ciprofloxacin Rash and Other (See Comments)    Localized red rash to IV site; "Allergic," per MAR   Penicillins Rash and Other (See Comments)    Tolerates multiple cephalosporins, including cephalexin; "Allergic to Penicillin," per [facility]    Patient Measurements: Heparin Dosing Weight: TBW (58 kg)  Vital Signs: Temp: 97.8 F (36.6 C) (01/27 0739) Temp Source: Oral (01/27 0739) BP: 135/54 (01/27 0739) Pulse Rate: 73 (01/27 0739)  Labs: Recent Labs    04/21/21 0421 04/21/21 2130 04/22/21 0508 04/22/21 0748 04/23/21 0225 04/23/21 0817  HGB 10.7*  --  9.4*  --  9.1*  --   HCT 34.0*  --  29.4*  --  29.3*  --   PLT 340  --  324  --  330  --   HEPARINUNFRC  --  0.72*  --  0.40  --  0.31  CREATININE 0.95  --  1.25*  --  1.30*  --     Estimated Creatinine Clearance: 26.9 mL/min (A) (by C-G formula based on SCr of 1.3 mg/dL (H)).   Assessment: 88 year old female with medical history significant for prior CVA and HFpEF who presented with syncope/confusion found to have acute MCA territory infarct and age indeterminate DVTs. Pharmacy consulted to manage heparin infusion.  Per stroke protocol, no bolus and lower goal range of 0.3-0.5. Previous heparin level therapeutic at 0.4. Repeat heparin level 0.31. Will empirically increase to prevent heparin level from becoming subtherapeutic. Current CBC stable, no s/sx bleeding reported.   Goal of Therapy:  Heparin level 0.3-0.5 units/ml Monitor platelets by anticoagulation protocol: Yes   Plan: Increase heparin infusion to 850 units/hr Check heparin level daily while on heparin Follow-up plans for changing to apixaban Continue to monitor H&H and platelets    Thank you for allowing pharmacy to be a part of this patients care.  Ardyth Harps, PharmD Clinical Pharmacist

## 2021-04-23 NOTE — Progress Notes (Addendum)
Physical Therapy Treatment Patient Details Name: Lisa Villegas MRN: [id number] DOB: May 14, 1930 Today's Date: 04/23/2021   History of Present Illness Lisa Villegas is a 88 y.o. female Presenting with syncope from ALF on 04/19/21. MRI-Moderately large acute to early subacute posterior right MCA  infarct. PMH: chronic diastolic HF, YQM5H, DM2, HTN, HLD.autism, recent R metatarsal fx    PT Comments    Patient fearful/anxious with mobility needing assist to keep from grabbing rails and pushing back onto bed; tense and holding rails when up in supported sitting as pt pushing back; able to utilize R UE to hold and drink her Ensure.  She had limited initiation for anterior weight shift while on EOB so no transfers attempted this session.  She had a couple verbalizations during session.  PT will continue to follow acutely.  Continue to recommend SNF level rehab at d/c.    Recommendations for follow up therapy are one component of a multi-disciplinary discharge planning process, led by the attending physician.  Recommendations may be updated based on patient status, additional functional criteria and insurance authorization.  Follow Up Recommendations  Skilled nursing-short term rehab (<3 hours/day)     Assistance Recommended at Discharge Frequent or constant Supervision/Assistance  Patient can return home with the following Two people to help with walking and/or transfers;Two people to help with bathing/dressing/bathroom;Assist for transportation;Help with stairs or ramp for entrance;Direct supervision/assist for medications management;Assistance with feeding   Equipment Recommendations  None recommended by PT    Recommendations for Other Services       Precautions / Restrictions Precautions Precaution Comments: Autistic, Difficulty following directions Splint/Cast: post op shoe on right foot Restrictions RLE Weight Bearing: Weight bearing as tolerated     Mobility  Bed  Mobility Overal bed mobility: Needs Assistance Bed Mobility: Supine to Sit, Sit to Supine     Supine to sit: HOB elevated, Total assist Sit to supine: Mod assist, +2 for safety/equipment   General bed mobility comments: increased time, initiated to move feet toward EOB with guidance, then pulled them back onto bed when initiating scooting, total A for supine to sit due to pt fearful and at times pushing to lie back down; reciprocal scooting to EOB and then to supine assist for legs onto bed and for trunk and +2 to scoot up in bed    Transfers                        Ambulation/Gait                   Stairs             Wheelchair Mobility    Modified Rankin (Stroke Patients Only) Modified Rankin (Stroke Patients Only) Pre-Morbid Rankin Score: Severe disability (not sure if she was ambulatory at ALF) Modified Rankin: Severe disability     Balance Overall balance assessment: Needs assistance Sitting-balance support: Feet supported Sitting balance-Leahy Scale: Zero Sitting balance - Comments: pt pushing back to lie back down; anxious when coming up to EOB; max A for sitting balance, pt holding rail, then assisted to let go to allow her to use her hand to self feed sitting EOB; finished her Ensure milkshake and then ate one potato chip.  only once effort for anterior weight shift when attempting to drink her milk shake  Cognition Arousal/Alertness: Awake/alert Behavior During Therapy: Flat affect Overall Cognitive Status: No family/caregiver present to determine baseline cognitive functioning                                 General Comments: needs time and multimodal cues to follow commands about 60% of the time; extremely fearful/anxious with all mobility; very limited verbal communication        Exercises Other Exercises Other Exercises: AAROM heel slides bilateral LE's    General  Comments        Pertinent Vitals/Pain Pain Assessment Pain Assessment: No/denies pain    Home Living                          Prior Function            PT Goals (current goals can now be found in the care plan section) Progress towards PT goals: Progressing toward goals    Frequency    Min 3X/week      PT Plan Current plan remains appropriate;Frequency needs to be updated    Co-evaluation              AM-PAC PT "6 Clicks" Mobility   Outcome Measure  Help needed turning from your back to your side while in a flat bed without using bedrails?: Total Help needed moving from lying on your back to sitting on the side of a flat bed without using bedrails?: Total Help needed moving to and from a bed to a chair (including a wheelchair)?: Total Help needed standing up from a chair using your arms (e.g., wheelchair or bedside chair)?: Total Help needed to walk in hospital room?: Total Help needed climbing 3-5 steps with a railing? : Total 6 Click Score: 6    End of Session   Activity Tolerance: Other (comment);Patient limited by fatigue (limited by anxiety) Patient left: in bed;with call bell/phone within reach;with bed alarm set   PT Visit Diagnosis: History of falling (Z91.81);Other abnormalities of gait and mobility (R26.89)     Time: 5277-8242 PT Time Calculation (min) (ACUTE ONLY): 23 min  Charges:  $Therapeutic Activity: 23-37 mins                     Magda Kiel, PT Acute Rehabilitation Services PNTIR:[phone] Office:[phone] 04/23/2021    Lisa Villegas 04/23/2021, 4:43 PM

## 2021-04-23 NOTE — Plan of Care (Signed)
  Problem: Clinical Measurements: Goal: Respiratory complications will improve Outcome: Progressing Goal: Cardiovascular complication will be avoided Outcome: Progressing   Problem: Activity: Goal: Risk for activity intolerance will decrease Outcome: Progressing   Problem: Nutrition: Goal: Adequate nutrition will be maintained Outcome: Progressing   

## 2021-04-23 NOTE — Assessment & Plan Note (Signed)
Continue supplements

## 2021-04-23 NOTE — Progress Notes (Signed)
Speech Language Pathology Treatment: Cognitive-Linquistic;Dysphagia  Patient Details Name: Lisa Villegas MRN: [id number] DOB: 1931-02-13 Today's Date: 04/23/2021 Time: 7124-5809 SLP Time Calculation (min) (ACUTE ONLY): 22 min  Assessment / Plan / Recommendation Clinical Impression  Utilized recommended strategies from legal guardian in order to increase pt participation with skilled interventions this date, however pt's willingness to complete diagnostic assessment of cognitive-linguistic function and swallowing remained poor. RN reports that pt refused pm meal, but meds whole in puree and sips of Ensure were without signs of aspiration. Clinician offered various textured POs during session, however she consented to only a few bites of dys 3 fruit cup and x3 sips of ensure. Belching noted post liquid intake, no s/sx of aspiration. She perseverated on "no" when given tasks related to cognitive-linguistic function and continue to question impact of participation on performance during informal assessment. Today she is oriented fully to self only, requiring choice cues for city and year. Simple biographical and environmental y/n questions were answered with 100% accuracy and moderate level questions with 0% accuracy. She named 8/10 tangible objects in room, demonstrating some phonemic paraphasias. She benefited from semantic, phonemic and sentence completion cues to name 2/10 objects. She refused any further intervention this date. Recommend continuation of current diet. Will f/u for tolerance and further dx treatment of cognitive-linguistic function due to limited nature of session today.    HPI HPI: Pt is an 88 y.o. female who presented to the ED from ALF for acute onset weakness and hypoxia. MRI brain 04/19/21: Moderately large acute to early subacute posterior right MCA infarct. Acute metabolic encephalopathy present on admission; EMS reported mentation to be at baseline. PMH: HTN, chronic diastolic  CHF, X8PJ, autism, HLD, and CKD Stage IIIa. BSE 07/30/19: regular texture diet and thin liqiuds recommended. SLP eval in 2019 speech and language WFL with some hesitancy noted during communication.      SLP Plan  Continue with current plan of care      Recommendations for follow up therapy are one component of a multi-disciplinary discharge planning process, led by the attending physician.  Recommendations may be updated based on patient status, additional functional criteria and insurance authorization.    Recommendations  Diet recommendations: Regular;Thin liquid Liquids provided via: Straw;Cup Medication Administration: Whole meds with puree Supervision: Staff to assist with self feeding Compensations: Slow rate;Small sips/bites Postural Changes and/or Swallow Maneuvers: Seated upright 90 degrees;Upright 30-60 min after meal                Oral Care Recommendations: Oral care BID Follow Up Recommendations: Other (comment) (TBD) Assistance recommended at discharge: Frequent or constant Supervision/Assistance SLP Visit Diagnosis: Dysphagia, unspecified (R13.10);Cognitive communication deficit (R41.841) Plan: Continue with current plan of care         [place], [place], [place] Office Number: [place]  04/23/2021, 1:12 PM

## 2021-04-23 NOTE — TOC Progression Note (Signed)
Transition of Care [facility]) - Progression Note    Patient Details  Name: Lisa Villegas MRN: [id number] Date of Birth: [birth year]  [facility] of [facility] CM/SW [place], [place] Phone Number: 04/23/2021, 11:25 AM  Clinical Narrative:   CSW spoke with [facility], confirmed that they will not have a bed available for the patient at discharge. CSW then spoke with legal guardian to provide other bed offers. Guardian to review options available and get back to CSW with preferences. CSW to follow.    Expected Discharge Plan: [place] Barriers to Discharge: Continued Medical Work up  Expected Discharge Plan and Services Expected Discharge Plan: [place] Choice: [place] arrangements for the past 2 months: [place]                                       Social Determinants of Health (SDOH) Interventions    Readmission Risk Interventions Readmission Risk Prevention Plan 08/01/2019 04/02/2019  Transportation Screening Complete Complete  PCP or Specialist Appt within 3-5 Days Complete Complete  HRI or Home Care Consult Complete Complete  Social Work Consult for [place] Planning/Counseling Complete Complete  Palliative Care Screening Complete Not Applicable  Medication Review [employer]) Complete Referral to Pharmacy  Some recent data might be hidden

## 2021-04-23 NOTE — Progress Notes (Signed)
PROGRESS NOTE        PATIENT DETAILS Name: Lisa Villegas Age: 88 y.o. Sex: female Date of Birth: Jun 20, 1930 Admit Date: 04/19/2021 Admitting Physician Jonnie Finner, DO PFX:[id number], Ajith, MD  Brief Summary: 88 year old female with history of autism, prior CVA, HFpEF, CKD stage IIIa, DM-2, HTN, HLD-who presented with syncope/confusion-found to have acute MCA territory infarct and bilateral lower extremity DVT.  See below for further details.  Significant events: 1/3-1/12>> hospitalization for AKI/multiple closed fractures of metatarsal of right foot 1/23>>  admit to [facility] for syncope/acute CVA 1/24>>  Transferred to [facility]  Significant studies: 1/23>> CXR: Bilateral pleural effusion 1/23>> MRI brain: Moderate large acute posterior right MCA infarct.  Associated cytotoxic edema 1/24>> MRA head/neck: 70% stenosis left ICA, 60% proximal right ICA, moderate to severe stenosis right P2/P3, severe stenosis of distal left M1, proximal M2. 1/24>> A1c: 6.8 1/24>> LDL: 134 1/25>> Spot EEG: No seizures. 1/25>> carotid Doppler: Right ICA 40-59% stenosis, left ICA 1-39% stenosis 1/25>> lower extremity Dopplers: DVT left posterior tibial/left peroneal vein, age-indeterminate DVT involving the right common femoral, right femoral, right proximal profunda, popliteal, and right posterior tibial vein. 1/25>> Echo: EF 92-42%, grade 1 diastolic dysfunction.  RV systolic function is moderately reduced.  Microbiology data: 1/23>> blood culture: No growth  Subjective: RN aide feeding her breakfast.  She is awake-follow simple commands.  Nonfocal exam.  Objective: Vitals: Blood pressure 131/69, pulse 82, temperature 97.8 F (36.6 C), temperature source Oral, resp. rate 20, weight 59.8 kg, SpO2 94 %.   Exam: Gen Exam:Alert awake-not in any distress HEENT:atraumatic, normocephalic Chest: B/L clear to auscultation anteriorly CVS:S1S2 regular Abdomen:soft non tender,  non distended Extremities:no edema Neurology: Symmetrical-but with generalized weakness. Skin: no rash   Pertinent Labs/Radiology: CBC Latest Ref Rng & Units 04/23/2021 04/22/2021 04/21/2021  WBC 4.0 - 10.5 K/uL 8.3 9.0 9.1  Hemoglobin 12.0 - 15.0 g/dL 9.1(L) 9.4(L) 10.7(L)  Hematocrit 36.0 - 46.0 % 29.3(L) 29.4(L) 34.0(L)  Platelets 150 - 400 K/uL 330 324 340    Lab Results  Component Value Date   NA 134 (L) 04/23/2021   K 3.6 04/23/2021   CL 106 04/23/2021   CO2 18 (L) 04/23/2021      Assessment/Plan: * Acute posterior right MCA territory infarct- (present on admission) Continues to have dysarthria-but motor exam appears to be symmetrical with significant generalized weakness.  Suspicion for PFO-and possible embolic stroke given that she has significant DVT in her lower extremities.  It is also possible that this could be thromboembolic stroke from large vessel disease.  See work-up as above.  On IV heparin-neurology recommending transitioning to oral anticoagulant in a few days.  Given advanced age/frailty-not a candidate for aggressive care including invasive vascular procedures.  Suspect best to manage medically.    Syncope- (present on admission) Initially thought to be vasovagal or orthostatic etiology-now with extensive DVT-in slightly decreased RV systolic function on echo-suspect was from from VTE/pulmonary embolism.  EEG without seizures.  Already on anticoagulation-further work-up including a CTA chest would not change management-as she is already on anticoagulation.  Bilateral lower extremity DVT. Likely provoked due to sedentary lifestyle/immobility due to recent fracture-given the fact that she syncopized-and has mild RV systolic dysfunction-likely has a PE as well.  However she is hemodynamically stable-not hypoxic-further work-up will not change management-as she is already on IV heparin-hence  will not pursue CTA chest.  Will probably require indefinite anticoagulation.   This MD discussed with POA-Ms. [name] 1/26-she was understanding and agreeable to this strategy.  Acute metabolic encephalopathy- (present on admission) Probably due to AKI/CVA/possible VTE-she is back to her baseline.  Acute renal failure superimposed on stage 3a chronic kidney disease ([place])- (present on admission) AKI likely hemodynamically mediated-improved with supportive care.  Continue to follow electrolytes periodically.  Bilateral pleural effusion- (present on admission) Probably related to HFpEF-or from pulmonary embolism.  Given the fact that she is asymptomatic/frail/on anticoagulation-would avoid thoracocentesis is much as possible-as this is highly likely that this is a transudate.  Will need to repeat chest x-rays in the outpatient setting.    SIRS (systemic inflammatory response syndrome) ([place])- (present on admission) Noninfectious etiology-no signs of infection.  Being monitored off antimicrobial therapy.  Follow cultures.    Lactic acidosis- (present on admission) Suspect due to hypoperfusion/dehydration-no indication of infection at this point.  Doubt any clinical significance at this point-has been adequately hydrated.  Hypertensive urgency- (present on admission) BP stable-continue Verapmail, Metoprolol, Hydaralazine  Type II diabetes mellitus with renal manifestations (A1c 6.8 on 1/24)- (present on admission) CBG stable with SSI.   Recent Labs    04/22/21 2329 04/23/21 0319 04/23/21 0803  GLUCAP 112* 123* 122*    Hyperlipidemia- (present on admission) Continue high intensity statin.  Repeat lipid panel in 3 months.  GERD (gastroesophageal reflux disease)- (present on admission) Continue PPI.  FTT (failure to thrive) in adult- (present on admission) Prognosis is guarded in the setting of large stroke.  Appears frail at baseline-with numerous medical problems-do not think she is a good candidate for aggressive care-DNR in place.Mar[place] Kitchen  Appreciate  palliative care evaluation.  Spoke at length with POA [name] on 1/26.  History of Hodgkin's disease Reported history.  Unable to verify.  Malnutrition of moderate degree- (present on admission) Continue supplements per dietitian.  Multiple closed fractures of metatarsal bone of right foot- (present on admission) Recent admission for the same.  Resume outpatient follow-up with orthopedics.   Protein-calorie malnutrition, severe- (present on admission) Continue supplements.  Autism- (present on admission) History of autistic spectrum disorder-suspect mental status back to baseline.  Nutrition Status: Nutrition Problem: Severe Malnutrition Etiology: chronic illness (CHF, h/o CVA) Signs/Symptoms: severe muscle depletion, severe fat depletion Interventions: Ensure Enlive (each supplement provides 350kcal and 20 grams of protein), MVI  BMI: Estimated body mass index is 21.94 kg/m as calculated from the following:   Height as of 04/01/21: 5\' 5"  (1.651 m).   Weight as of this encounter: 59.8 kg.    DVT Prophylaxis: IV Heparin Procedures: None Consults: Neurology Code Status:DNR Family Communication:  Legal gaurdianMarylynn Pearson [phone]-updated on 1/26.   Disposition Plan: Status is: Inpatient  Remains inpatient appropriate because: CVA-extensive DVT-on IV heparin-will be transition to oral agent in the next few days-needs SNF on discharge.    Diet: Diet Order             Diet regular Room service appropriate? Yes with Assist; Fluid consistency: Thin  Diet effective now                     Antimicrobial agents: Anti-infectives (From admission, onward)    Start     Dose/Rate Route Frequency Ordered Stop   04/21/21 1600  vancomycin (VANCOCIN) IVPB 1000 mg/200 mL premix  Status:  Discontinued        1,000 mg 200 mL/hr over 60 Minutes  Intravenous Every 48 hours 04/19/21 1609 04/20/21 0823   04/19/21 1615  vancomycin (VANCOREADY) IVPB 1250 mg/250 mL         1,250 mg 166.7 mL/hr over 90 Minutes Intravenous  Once 04/19/21 1609 04/19/21 2004   04/19/21 1615  ceFEPIme (MAXIPIME) 2 g in sodium chloride 0.9 % 100 mL IVPB  Status:  Discontinued        2 g 200 mL/hr over 30 Minutes Intravenous Every 12 hours 04/19/21 1611 04/20/21 0823   04/19/21 1330  cefTRIAXone (ROCEPHIN) 2 g in sodium chloride 0.9 % 100 mL IVPB        2 g 200 mL/hr over 30 Minutes Intravenous  Once 04/19/21 1324 04/19/21 1432   04/19/21 1330  azithromycin (ZITHROMAX) 500 mg in sodium chloride 0.9 % 250 mL IVPB        500 mg 250 mL/hr over 60 Minutes Intravenous  Once 04/19/21 1324 04/19/21 1530        MEDICATIONS: Scheduled Meds:  atorvastatin  40 mg Oral Daily   feeding supplement  237 mL Oral BID BM   furosemide  40 mg Oral Daily   hydrALAZINE  50 mg Oral TID   insulin aspart  0-9 Units Subcutaneous Q4H   metoprolol tartrate  12.5 mg Oral BID   multivitamin with minerals  1 tablet Oral Daily   oxybutynin  5 mg Oral BID   pantoprazole (PROTONIX) IV  40 mg Intravenous Q24H   verapamil  120 mg Oral QHS   Continuous Infusions:  heparin 850 Units/hr (04/23/21 1019)   PRN Meds:.   I have personally reviewed following labs and imaging studies  LABORATORY DATA: CBC: Recent Labs  Lab 04/19/21 1314 04/19/21 1321 04/20/21 0534 04/21/21 0421 04/22/21 0508 04/23/21 0225  WBC 11.3*  --  8.1 9.1 9.0 8.3  NEUTROABS 9.6*  --   --  6.7 6.0 5.1  HGB 10.2* 10.9* 8.7* 10.7* 9.4* 9.1*  HCT 33.2* 32.0* 27.9* 34.0* 29.4* 29.3*  MCV 90.0  --  89.4 86.1 85.7 86.4  PLT 344  --  280 340 324 696    Basic Metabolic Panel: Recent Labs  Lab 04/19/21 1314 04/19/21 1321 04/20/21 0534 04/21/21 0421 04/22/21 0508 04/23/21 0225  NA 137 136 134* 132* 135 134*  K 4.6 4.6 4.5 4.0 3.6 3.6  CL 105 106 106 102 106 106  CO2 18*  --  21* 18* 20* 18*  GLUCOSE 145* 142* 127* 120* 122* 121*  BUN 29* 27* 24* 13 19 21   CREATININE 1.33* 1.30* 0.98 0.95 1.25* 1.30*  CALCIUM 8.5*  --   8.3* 8.8* 8.4* 8.1*  MG  --   --   --  1.4* 1.3* 1.3*    GFR: Estimated Creatinine Clearance: 26.9 mL/min (A) (by C-G formula based on SCr of 1.3 mg/dL (H)).  Liver Function Tests: Recent Labs  Lab 04/19/21 1314 04/20/21 0534  AST 26 18  ALT 25 28  ALKPHOS 109 100  BILITOT 0.4 0.4  PROT 6.7 5.9*  ALBUMIN 3.0* 2.7*   No results for input(s): LIPASE, AMYLASE in the last 168 hours. No results for input(s): AMMONIA in the last 168 hours.  Coagulation Profile: Recent Labs  Lab 04/19/21 1311 04/20/21 0534  INR 1.1 1.1    Cardiac Enzymes: No results for input(s): CKTOTAL, CKMB, CKMBINDEX, TROPONINI in the last 168 hours.  BNP (last 3 results) No results for input(s): PROBNP in the last 8760 hours.  Lipid Profile: No results for input(s): CHOL,  HDL, LDLCALC, TRIG, CHOLHDL, LDLDIRECT in the last 72 hours.  Thyroid Function Tests: No results for input(s): TSH, T4TOTAL, FREET4, T3FREE, THYROIDAB in the last 72 hours.  Anemia Panel: No results for input(s): VITAMINB12, FOLATE, FERRITIN, TIBC, IRON, RETICCTPCT in the last 72 hours.  Urine analysis:    Component Value Date/Time   COLORURINE YELLOW 04/19/2021 1924   APPEARANCEUR HAZY (A) 04/19/2021 1924   APPEARANCEUR Clear 10/11/2017 1310   LABSPEC 1.025 04/19/2021 1924   PHURINE 5.0 04/19/2021 1924   GLUCOSEU NEGATIVE 04/19/2021 1924   HGBUR NEGATIVE 04/19/2021 1924   BILIRUBINUR NEGATIVE 04/19/2021 1924   BILIRUBINUR Negative 10/11/2017 1310   KETONESUR 5 (A) 04/19/2021 1924   PROTEINUR 100 (A) 04/19/2021 1924   UROBILINOGEN negative 03/14/2016 1227   NITRITE NEGATIVE 04/19/2021 1924   LEUKOCYTESUR SMALL (A) 04/19/2021 1924    Sepsis Labs: Lactic Acid, Venous    Component Value Date/Time   LATICACIDVEN 1.4 04/19/2021 2230    MICROBIOLOGY: Recent Results (from the past 240 hour(s))  Blood Culture (routine x 2)     Status: None (Preliminary result)   Collection Time: 04/19/21  1:07 PM   Specimen: Left  Antecubital; Blood  Result Value Ref Range Status   Specimen Description   Final    LEFT ANTECUBITAL Performed at [facility], [place] [address] [place], [place] [address]    Special Requests   Final    Blood Culture results may not be optimal due to an inadequate volume of blood received in culture bottles BOTTLES DRAWN AEROBIC AND ANAEROBIC Performed at [facility], [place] [address], [place], [place] [address]    Culture   Final    NO GROWTH 4 DAYS Performed at [facility], [place] [address], [place], [place] [address]    Report Status PENDING  Incomplete  Resp Panel by RT-PCR (Flu A&B, Covid) Nasopharyngeal Swab     Status: None   Collection Time: 04/19/21  2:08 PM   Specimen: Nasopharyngeal Swab; Nasopharyngeal(NP) swabs in vial transport medium  Result Value Ref Range Status   SARS Coronavirus 2 by RT PCR NEGATIVE NEGATIVE Final    Comment: (NOTE) SARS-CoV-2 target nucleic acids are NOT DETECTED.  The SARS-CoV-2 RNA is generally detectable in upper respiratory specimens during the acute phase of infection. The lowest concentration of SARS-CoV-2 viral copies this assay can detect is 138 copies/mL. A negative result does not preclude SARS-Cov-2 infection and should not be used as the sole basis for treatment or other patient management decisions. A negative result may occur with  improper specimen collection/handling, submission of specimen other than nasopharyngeal swab, presence of viral mutation(s) within the areas targeted by this assay, and inadequate number of viral copies(<138 copies/mL). A negative result must be combined with clinical observations, patient history, and epidemiological information. The expected result is Negative.  Fact Sheet for Patients:  EntrepreneurPulse.com.au  Fact Sheet for Healthcare Providers:  IncredibleEmployment.be  This test is no t yet approved or cleared by  the [place] FDA and  has been authorized for detection and/or diagnosis of SARS-CoV-2 by FDA under an Emergency Use Authorization (EUA). This EUA will remain  in effect (meaning this test can be used) for the duration of the COVID-19 declaration under Section 564(b)(1) of the Act, 21 U.S.C.section 360bbb-3(b)(1), unless the authorization is terminated  or revoked sooner.       Influenza A by PCR NEGATIVE NEGATIVE Final   Influenza B by PCR NEGATIVE NEGATIVE Final  Comment: (NOTE) The Xpert Xpress SARS-CoV-2/FLU/RSV plus assay is intended as an aid in the diagnosis of influenza from Nasopharyngeal swab specimens and should not be used as a sole basis for treatment. Nasal washings and aspirates are unacceptable for Xpert Xpress SARS-CoV-2/FLU/RSV testing.  Fact Sheet for Patients: EntrepreneurPulse.com.au  Fact Sheet for Healthcare Providers: IncredibleEmployment.be  This test is not yet approved or cleared by the [place] FDA and has been authorized for detection and/or diagnosis of SARS-CoV-2 by FDA under an Emergency Use Authorization (EUA). This EUA will remain in effect (meaning this test can be used) for the duration of the COVID-19 declaration under Section 564(b)(1) of the Act, 21 U.S.C. section 360bbb-3(b)(1), unless the authorization is terminated or revoked.  Performed at [facility], [place] [address] [place], [place] [address]   Urine Culture     Status: Abnormal   Collection Time: 04/19/21  7:24 PM   Specimen: In/Out Cath Urine  Result Value Ref Range Status   Specimen Description   Final    IN/OUT CATH URINE Performed at [place] [address] [place], [place] [address]    Special Requests   Final    NONE Performed at [facility], [place] [address] [place], [place] [address]    Culture MULTIPLE SPECIES PRESENT, SUGGEST RECOLLECTION (A)  Final    Report Status 04/21/2021 FINAL  Final  Blood Culture (routine x 2)     Status: None (Preliminary result)   Collection Time: 04/19/21 10:29 PM   Specimen: BLOOD  Result Value Ref Range Status   Specimen Description   Final    BLOOD SITE NOT SPECIFIED Performed at [place] [address] [place], [place] [address]    Special Requests   Final    BOTTLES DRAWN AEROBIC AND ANAEROBIC Blood Culture adequate volume Performed at [place] [address] [place], [place] [address]    Culture   Final    NO GROWTH 3 DAYS Performed at [facility], [place] [address], [place], [place] [address]    Report Status PENDING  Incomplete  MRSA Next Gen by PCR, Nasal     Status: None   Collection Time: 04/19/21 10:30 PM   Specimen: Peripheral; Nasal Swab  Result Value Ref Range Status   MRSA by PCR Next Gen NOT DETECTED NOT DETECTED Final    Comment: (NOTE) The GeneXpert MRSA Assay (FDA approved for NASAL specimens only), is one component of a comprehensive MRSA colonization surveillance program. It is not intended to diagnose MRSA infection nor to guide or monitor treatment for MRSA infections. Test performance is not FDA approved in patients less than 3 years old. Performed at [facility], [place] [address], [place], [place] [address]     RADIOLOGY STUDIES/RESULTS: VAS US CAROTID  Result Date: 04/22/2021 Carotid Arterial Duplex Study Patient Name:  VERL [place]  Date of Exam:   04/21/2021 Medical Rec #: [id number]           Accession #:    [id number] Date of Birth: 04/04/1930           Patient Gender: F Patient Age:   62 years Exam Location:  [facility] Procedure:      VAS US CAROTID Referring Phys: Oren Binet --------------------------------------------------------------------------------  Indications:       CVA. Risk Factors:      Hypertension, hyperlipidemia, Diabetes. Limitations        Today's  exam was limited due to the  patient's inability or                    unwillingness to cooperate. Comparison Study:  07/27/2017 Carotid artery duplex- bilateral 1-39% ICA stenosis. Performing Technologist: Maudry Mayhew MHA, RDMS, RVT, RDCS  Examination Guidelines: A complete evaluation includes B-mode imaging, spectral Doppler, color Doppler, and power Doppler as needed of all accessible portions of each vessel. Bilateral testing is considered an integral part of a complete examination. Limited examinations for reoccurring indications may be performed as noted.  Right Carotid Findings: +----------+--------+--------+--------+---------------------+------------------+             PSV cm/s EDV cm/s Stenosis Plaque Description    Comments            +----------+--------+--------+--------+---------------------+------------------+  CCA Prox   58       9                 heterogenous,                                                                    irregular and                                                                    calcific                                  +----------+--------+--------+--------+---------------------+------------------+  CCA Distal 42       7                 heterogenous, smooth                                                             and calcific                              +----------+--------+--------+--------+---------------------+------------------+  ICA Prox   153      49       40-59%   heterogenous,                                                                    irregular and  calcific                                  +----------+--------+--------+--------+---------------------+------------------+  ICA Mid                                                     tortuous, unable                                                                 to Doppler          +----------+--------+--------+--------+---------------------+------------------+   ICA Distal 68       18                                      tortuous            +----------+--------+--------+--------+---------------------+------------------+  ECA        83       22                heterogenous and                                                                 calcific                                  +----------+--------+--------+--------+---------------------+------------------+ +----------+--------+-------+----------------+-------------------+             PSV cm/s EDV cms Describe         Arm Pressure (mmHG)  +----------+--------+-------+----------------+-------------------+  Subclavian 136              Multiphasic, WNL                      +----------+--------+-------+----------------+-------------------+ +---------+--------+--+--------+--+---------+  Vertebral PSV cm/s 89 EDV cm/s 24 Antegrade  +---------+--------+--+--------+--+---------+  Left Carotid Findings: +----------+--------+-------+--------+--------------------------------+--------+             PSV cm/s EDV     Stenosis Plaque Description               Comments                       cm/s                                                        +----------+--------+-------+--------+--------------------------------+--------+  CCA Prox   74       14               smooth and heterogenous                    +----------+--------+-------+--------+--------------------------------+--------+  CCA Distal 63       10               heterogenous and irregular                 +----------+--------+-------+--------+--------------------------------+--------+  ICA Prox   69       17      1-39%    smooth, heterogenous and                                                         calcific                                   +----------+--------+-------+--------+--------------------------------+--------+  ICA Distal 100      16                                                           +----------+--------+-------+--------+--------------------------------+--------+  ECA        94       9                                                           +----------+--------+-------+--------+--------------------------------+--------+ +----------+--------+--------+----------------+-------------------+             PSV cm/s EDV cm/s Describe         Arm Pressure (mmHG)  +----------+--------+--------+----------------+-------------------+  Subclavian 148               Multiphasic, WNL                      +----------+--------+--------+----------------+-------------------+ +---------+--------+--+--------+--+---------+  Vertebral PSV cm/s 62 EDV cm/s 14 Antegrade  +---------+--------+--+--------+--+---------+   Summary: Right Carotid: Velocities in the right ICA are consistent with a 40-59%                stenosis. Left Carotid: Velocities in the left ICA are consistent with a 1-39% stenosis. Vertebrals:  Bilateral vertebral arteries demonstrate antegrade flow. Subclavians: Normal flow hemodynamics were seen in bilateral subclavian              arteries. *See table(s) above for measurements and observations.  Electronically signed by Antony Contras MD on 04/22/2021 at 8:17:16 AM.    Final    VAS [place] LOWER EXTREMITY VENOUS (DVT)  Result Date: 04/21/2021  Lower Venous DVT Study Patient Name:  JOLENA KITTLE  Date of Exam:   04/21/2021 Medical Rec #: [id number]           Accession #:    [id number] Date of Birth: 1930-04-18           Patient Gender: F Patient Age:   50 years Exam [facility]  [facility] Procedure:      VAS [place] LOWER EXTREMITY VENOUS (DVT) Referring Phys: Cornelius Moras XU --------------------------------------------------------------------------------  Indications: Stroke.  Comparison Study: No prior study Performing Technologist: Maudry Mayhew MHA, RDMS,  RVT, RDCS  Examination Guidelines: A complete evaluation includes B-mode imaging, spectral Doppler, color Doppler, and power Doppler as needed  of all accessible portions of each vessel. Bilateral testing is considered an integral part of a complete examination. Limited examinations for reoccurring indications may be performed as noted. The reflux portion of the exam is performed with the patient in reverse Trendelenburg.  +---------+---------------+---------+-----------+----------+-----------------+  RIGHT     Compressibility Phasicity Spontaneity Properties Thrombus Aging     +---------+---------------+---------+-----------+----------+-----------------+  CFV       None                      No                     Age Indeterminate  +---------+---------------+---------+-----------+----------+-----------------+  FV Prox   None                                             Age Indeterminate  +---------+---------------+---------+-----------+----------+-----------------+  FV Mid    None                                             Age Indeterminate  +---------+---------------+---------+-----------+----------+-----------------+  FV Distal None                                             Age Indeterminate  +---------+---------------+---------+-----------+----------+-----------------+  PFV       None                                             Age Indeterminate  +---------+---------------+---------+-----------+----------+-----------------+  POP       None                      No                     Age Indeterminate  +---------+---------------+---------+-----------+----------+-----------------+  PTV       None                      No                     Age Indeterminate  +---------+---------------+---------+-----------+----------+-----------------+  PERO      None                      No                     Age Indeterminate  +---------+---------------+---------+-----------+----------+-----------------+  EIV                       No        Yes                                       +---------+---------------+---------+-----------+----------+-----------------+   Right  Technical Findings: Not visualized segments  include Right common iliac vein, IVC.  +---------+---------------+---------+-----------+----------+--------------+  LEFT      Compressibility Phasicity Spontaneity Properties Thrombus Aging  +---------+---------------+---------+-----------+----------+--------------+  CFV       Full            No        Yes                                    +---------+---------------+---------+-----------+----------+--------------+  SFJ       Full                                                             +---------+---------------+---------+-----------+----------+--------------+  FV Prox   Full                                                             +---------+---------------+---------+-----------+----------+--------------+  FV Mid    Full                                                             +---------+---------------+---------+-----------+----------+--------------+  FV Distal Full                                                             +---------+---------------+---------+-----------+----------+--------------+  PFV       Full                                                             +---------+---------------+---------+-----------+----------+--------------+  POP       Full            No        Yes                                    +---------+---------------+---------+-----------+----------+--------------+  PTV       None                                             Acute           +---------+---------------+---------+-----------+----------+--------------+  PERO      None  Acute           +---------+---------------+---------+-----------+----------+--------------+     Summary: RIGHT: - Findings consistent with age indeterminate deep vein thrombosis involving the right common femoral vein, right femoral vein, right proximal profunda vein, right popliteal vein, right posterior tibial veins, and right peroneal veins. - No cystic  structure found in the popliteal fossa. - Right external iliac vein is patent with pulsatile flow, suggestive of possibly elevated right heart pressure.  LEFT: - Findings consistent with acute deep vein thrombosis involving the left posterior tibial veins, and left peroneal veins. - No cystic structure found in the popliteal fossa. - Pulsatile flow noted, suggestive of possibly elevated right heart pressure.  *See table(s) above for measurements and observations. Electronically signed by Harold Barban MD on 04/21/2021 at 9:15:37 PM.    Final      LOS: 4 days   Oren Binet, MD  [facility]    To contact the attending provider between 7A-7P or the covering provider during after hours 7P-7A, please log into the web site [email or web address] and access using universal [place] password for that web site. If you do not have the password, please call the hospital operator.  04/23/2021, 11:16 AM

## 2021-04-23 NOTE — Assessment & Plan Note (Signed)
History of autistic spectrum disorder-suspect mental status back to baseline.

## 2021-04-24 LAB — BASIC METABOLIC PANEL
Anion gap: 9 (ref 5–15)
BUN: 24 mg/dL — ABNORMAL HIGH (ref 8–23)
CO2: 20 mmol/L — ABNORMAL LOW (ref 22–32)
Calcium: 8.2 mg/dL — ABNORMAL LOW (ref 8.9–10.3)
Chloride: 108 mmol/L (ref 98–111)
Creatinine, Ser: 1.32 mg/dL — ABNORMAL HIGH (ref 0.44–1.00)
GFR, Estimated: 39 mL/min — ABNORMAL LOW (ref >60)
Glucose, Bld: 126 mg/dL — ABNORMAL HIGH (ref 70–99)
Potassium: 3.7 mmol/L (ref 3.5–5.1)
Sodium: 137 mmol/L (ref 135–145)

## 2021-04-24 LAB — CBC
HCT: 27.3 % — ABNORMAL LOW (ref 36.0–46.0)
Hemoglobin: 9 g/dL — ABNORMAL LOW (ref 12.0–15.0)
MCH: 28 pg (ref 26.0–34.0)
MCHC: 33 g/dL (ref 30.0–36.0)
MCV: 84.8 fL (ref 80.0–100.0)
Platelets: 343 10*3/uL (ref 150–400)
RBC: 3.22 MIL/uL — ABNORMAL LOW (ref 3.87–5.11)
RDW: 15.9 % — ABNORMAL HIGH (ref 11.5–15.5)
WBC: 10 10*3/uL (ref 4.0–10.5)
nRBC: 0.2 % (ref 0.0–0.2)

## 2021-04-24 LAB — GLUCOSE, CAPILLARY
Glucose-Capillary: 128 mg/dL — ABNORMAL HIGH (ref 70–99)
Glucose-Capillary: 115 mg/dL — ABNORMAL HIGH (ref 70–99)
Glucose-Capillary: 219 mg/dL — ABNORMAL HIGH (ref 70–99)
Glucose-Capillary: 183 mg/dL — ABNORMAL HIGH (ref 70–99)
Glucose-Capillary: 106 mg/dL — ABNORMAL HIGH (ref 70–99)
Glucose-Capillary: 137 mg/dL — ABNORMAL HIGH (ref 70–99)

## 2021-04-24 LAB — HEPARIN LEVEL (UNFRACTIONATED)
Heparin Unfractionated: 0.28 IU/mL — ABNORMAL LOW (ref 0.30–0.70)
Heparin Unfractionated: 0.29 IU/mL — ABNORMAL LOW (ref 0.30–0.70)

## 2021-04-24 LAB — CULTURE, BLOOD (ROUTINE X 2): Culture: NO GROWTH

## 2021-04-24 NOTE — Progress Notes (Signed)
PROGRESS NOTE        PATIENT DETAILS Name: Lisa Villegas Age: 88 y.o. Sex: female Date of Birth: 1930/05/18 Admit Date: 04/19/2021 Admitting Physician Jonnie Finner, DO [email or web address], Ajith, MD  Brief Summary: 88 year old female with history of autism, prior CVA, HFpEF, CKD stage IIIa, DM-2, HTN, HLD-who presented with syncope/confusion-found to have acute MCA territory infarct and bilateral lower extremity DVT.  See below for further details.  Significant events: 1/3-1/12>> hospitalization for AKI/multiple closed fractures of metatarsal of right foot 1/23>>  admit to [facility] for syncope/acute CVA 1/24>>  Transferred to [facility]  Significant studies: 1/23>> CXR: Bilateral pleural effusion 1/23>> MRI brain: Moderate large acute posterior right MCA infarct.  Associated cytotoxic edema 1/24>> MRA head/neck: 70% stenosis left ICA, 60% proximal right ICA, moderate to severe stenosis right P2/P3, severe stenosis of distal left M1, proximal M2. 1/24>> A1c: 6.8 1/24>> LDL: 134 1/25>> Spot EEG: No seizures. 1/25>> carotid Doppler: Right ICA 40-59% stenosis, left ICA 1-39% stenosis 1/25>> lower extremity Dopplers: DVT left posterior tibial/left peroneal vein, age-indeterminate DVT involving the right common femoral, right femoral, right proximal profunda, popliteal, and right posterior tibial vein. 1/25>> Echo: EF 32-20%, grade 1 diastolic dysfunction.  RV systolic function is moderately reduced.  Microbiology data: 1/23>> blood culture: No growth  Subjective:  Patient in bed, appears comfortable, denies any headache, no fever, no chest pain or pressure, no shortness of breath , no abdominal pain. No new focal weakness.   Objective: Vitals: Blood pressure 114/66, pulse 78, temperature 98.3 F (36.8 C), temperature source Oral, resp. rate (!) 22, weight 59.8 kg, SpO2 94 %.   Exam:  Awake Alert, No new F.N deficits, Normal affect [place].AT,PERRAL Supple Neck,  No JVD,   Symmetrical Chest wall movement, Good air movement bilaterally, CTAB RRR,No Gallops, Rubs or new Murmurs,  +ve B.Sounds, Abd Soft, No tenderness,   No Cyanosis, Clubbing or edema    Pertinent Labs/Radiology: CBC Latest Ref Rng & Units 04/24/2021 04/23/2021 04/22/2021  WBC 4.0 - 10.5 K/uL 10.0 8.3 9.0  Hemoglobin 12.0 - 15.0 g/dL 9.0(L) 9.1(L) 9.4(L)  Hematocrit 36.0 - 46.0 % 27.3(L) 29.3(L) 29.4(L)  Platelets 150 - 400 K/uL 343 330 324    Lab Results  Component Value Date   NA 137 04/24/2021   K 3.7 04/24/2021   CL 108 04/24/2021   CO2 20 (L) 04/24/2021      Assessment/Plan: * Acute posterior right MCA territory infarct- (present on admission) Continues to have dysarthria-but motor exam appears to be symmetrical with significant generalized weakness.  Suspicion for PFO-and possible embolic stroke given that she has significant DVT in her lower extremities.  It is also possible that this could be thromboembolic stroke from large vessel disease.  See work-up as above.  On IV heparin-neurology recommending transitioning to oral anticoagulant in a few days.  Given advanced age/frailty-not a candidate for aggressive care including invasive vascular procedures.  Suspect best to manage medically.    Bilateral lower extremity DVT. Likely provoked due to sedentary lifestyle/immobility due to recent fracture-given the fact that she syncopized-and has mild RV systolic dysfunction-likely has a PE as well.  However she is hemodynamically stable-not hypoxic-further work-up will not change management-as she is already on IV heparin-hence will not pursue CTA chest.  Will probably require indefinite anticoagulation.  This MD discussed with POA-Ms. [name] 1/26-she  was understanding and agreeable to this strategy.  Protein-calorie malnutrition, severe- (present on admission) Continue supplements.  Bilateral pleural effusion- (present on admission) Probably related to HFpEF-or from  pulmonary embolism.  Given the fact that she is asymptomatic/frail/on anticoagulation-would avoid thoracocentesis is much as possible-as this is highly likely that this is a transudate.  Will need to repeat chest x-rays in the outpatient setting.    Acute renal failure superimposed on stage 3a chronic kidney disease ([place])- (present on admission) AKI likely hemodynamically mediated-improved with supportive care.  Continue to follow electrolytes periodically.  Multiple closed fractures of metatarsal bone of right foot- (present on admission) Recent admission for the same.  Resume outpatient follow-up with orthopedics.   Malnutrition of moderate degree- (present on admission) Continue supplements per dietitian.  SIRS (systemic inflammatory response syndrome) ([place])- (present on admission) Noninfectious etiology-no signs of infection.  Being monitored off antimicrobial therapy.  Follow cultures.    Lactic acidosis- (present on admission) Suspect due to hypoperfusion/dehydration-no indication of infection at this point.  Doubt any clinical significance at this point-has been adequately hydrated.  History of Hodgkin's disease Reported history.  Unable to verify.  FTT (failure to thrive) in adult- (present on admission) Prognosis is guarded in the setting of large stroke.  Appears frail at baseline-with numerous medical problems-do not think she is a good candidate for aggressive care-DNR in place.Marland Kitchen  Appreciate palliative care evaluation.  Spoke at length with POA [name] on 1/26.  Syncope- (present on admission) Initially thought to be vasovagal or orthostatic etiology-now with extensive DVT-in slightly decreased RV systolic function on echo-suspect was from from VTE/pulmonary embolism.  EEG without seizures.  Already on anticoagulation-further work-up including a CTA chest would not change management-as she is already on anticoagulation.  Hypertensive urgency- (present on admission) BP  stable-continue Verapmail, Metoprolol, Hydaralazine  GERD (gastroesophageal reflux disease)- (present on admission) Continue PPI.  Acute metabolic encephalopathy- (present on admission) Probably due to AKI/CVA/possible VTE-she is back to her baseline.  Autism- (present on admission) History of autistic spectrum disorder-suspect mental status back to baseline.  Hyperlipidemia- (present on admission) Continue high intensity statin.  Repeat lipid panel in 3 months.  Type II diabetes mellitus with renal manifestations (A1c 6.8 on 1/24)- (present on admission) CBG stable with SSI.   Recent Labs    04/22/21 2329 04/23/21 0319 04/23/21 0803  GLUCAP 112* 123* 122*    Nutrition Status: Nutrition Problem: Severe Malnutrition Etiology: chronic illness (CHF, h/o CVA) Signs/Symptoms: severe muscle depletion, severe fat depletion Interventions: Ensure Enlive (each supplement provides 350kcal and 20 grams of protein), MVI  BMI: Estimated body mass index is 21.94 kg/m as calculated from the following:   Height as of 04/01/21: 5\' 5"  (1.651 m).   Weight as of this encounter: 59.8 kg.    DVT Prophylaxis: IV Heparin Procedures: None Consults: Neurology Code Status:DNR Family Communication:  Legal gaurdianMarylynn Pearson [phone]-updated on 1/26.   Disposition Plan: Status is: Inpatient  Remains inpatient appropriate because: CVA-extensive DVT-on IV heparin-will be transition to oral agent in the next few days-needs SNF on discharge.    Diet: Diet Order             Diet regular Room service appropriate? Yes with Assist; Fluid consistency: Thin  Diet effective now                     Antimicrobial agents: Anti-infectives (From admission, onward)    Start     Dose/Rate Route Frequency Ordered Stop  04/21/21 1600  vancomycin (VANCOCIN) IVPB 1000 mg/200 mL premix  Status:  Discontinued        1,000 mg 200 mL/hr over 60 Minutes Intravenous Every 48 hours 04/19/21 1609  04/20/21 0823   04/19/21 1615  vancomycin (VANCOREADY) IVPB 1250 mg/250 mL        1,250 mg 166.7 mL/hr over 90 Minutes Intravenous  Once 04/19/21 1609 04/19/21 2004   04/19/21 1615  ceFEPIme (MAXIPIME) 2 g in sodium chloride 0.9 % 100 mL IVPB  Status:  Discontinued        2 g 200 mL/hr over 30 Minutes Intravenous Every 12 hours 04/19/21 1611 04/20/21 0823   04/19/21 1330  cefTRIAXone (ROCEPHIN) 2 g in sodium chloride 0.9 % 100 mL IVPB        2 g 200 mL/hr over 30 Minutes Intravenous  Once 04/19/21 1324 04/19/21 1432   04/19/21 1330  azithromycin (ZITHROMAX) 500 mg in sodium chloride 0.9 % 250 mL IVPB        500 mg 250 mL/hr over 60 Minutes Intravenous  Once 04/19/21 1324 04/19/21 1530        MEDICATIONS: Scheduled Meds:  atorvastatin  40 mg Oral Daily   feeding supplement  237 mL Oral BID BM   furosemide  40 mg Oral Daily   hydrALAZINE  50 mg Oral TID   insulin aspart  0-9 Units Subcutaneous Q4H   metoprolol tartrate  12.5 mg Oral BID   multivitamin with minerals  1 tablet Oral Daily   oxybutynin  5 mg Oral BID   pantoprazole (PROTONIX) IV  40 mg Intravenous Q24H   verapamil  120 mg Oral QHS   Continuous Infusions:  heparin 900 Units/hr (04/24/21 0941)   PRN Meds:.   I have personally reviewed following labs and imaging studies  LABORATORY DATA: CBC: Recent Labs  Lab 04/19/21 1314 04/19/21 1321 04/20/21 0534 04/21/21 0421 04/22/21 0508 04/23/21 0225 04/24/21 0117  WBC 11.3*  --  8.1 9.1 9.0 8.3 10.0  NEUTROABS 9.6*  --   --  6.7 6.0 5.1  --   HGB 10.2*   < > 8.7* 10.7* 9.4* 9.1* 9.0*  HCT 33.2*   < > 27.9* 34.0* 29.4* 29.3* 27.3*  MCV 90.0  --  89.4 86.1 85.7 86.4 84.8  PLT 344  --  280 340 324 330 343   < > = values in this interval not displayed.    Basic Metabolic Panel: Recent Labs  Lab 04/20/21 0534 04/21/21 0421 04/22/21 0508 04/23/21 0225 04/24/21 0117  NA 134* 132* 135 134* 137  K 4.5 4.0 3.6 3.6 3.7  CL 106 102 106 106 108  CO2 21* 18*  20* 18* 20*  GLUCOSE 127* 120* 122* 121* 126*  BUN 24* 13 19 21  24*  CREATININE 0.98 0.95 1.25* 1.30* 1.32*  CALCIUM 8.3* 8.8* 8.4* 8.1* 8.2*  MG  --  1.4* 1.3* 1.3*  --     GFR: Estimated Creatinine Clearance: 26.5 mL/min (A) (by C-G formula based on SCr of 1.32 mg/dL (H)).  Liver Function Tests: Recent Labs  Lab 04/19/21 1314 04/20/21 0534  AST 26 18  ALT 25 28  ALKPHOS 109 100  BILITOT 0.4 0.4  PROT 6.7 5.9*  ALBUMIN 3.0* 2.7*   No results for input(s): LIPASE, AMYLASE in the last 168 hours. No results for input(s): AMMONIA in the last 168 hours.  Coagulation Profile: Recent Labs  Lab 04/19/21 1311 04/20/21 0534  INR 1.1 1.1  Cardiac Enzymes: No results for input(s): CKTOTAL, CKMB, CKMBINDEX, TROPONINI in the last 168 hours.  BNP (last 3 results) No results for input(s): PROBNP in the last 8760 hours.  Lipid Profile: No results for input(s): CHOL, HDL, LDLCALC, TRIG, CHOLHDL, LDLDIRECT in the last 72 hours.  Thyroid Function Tests: No results for input(s): TSH, T4TOTAL, FREET4, T3FREE, THYROIDAB in the last 72 hours.  Anemia Panel: No results for input(s): VITAMINB12, FOLATE, FERRITIN, TIBC, IRON, RETICCTPCT in the last 72 hours.  Urine analysis:    Component Value Date/Time   COLORURINE YELLOW 04/19/2021 1924   APPEARANCEUR HAZY (A) 04/19/2021 1924   APPEARANCEUR Clear 10/11/2017 1310   LABSPEC 1.025 04/19/2021 1924   PHURINE 5.0 04/19/2021 1924   GLUCOSEU NEGATIVE 04/19/2021 1924   HGBUR NEGATIVE 04/19/2021 1924   BILIRUBINUR NEGATIVE 04/19/2021 1924   BILIRUBINUR Negative 10/11/2017 1310   KETONESUR 5 (A) 04/19/2021 1924   PROTEINUR 100 (A) 04/19/2021 1924   UROBILINOGEN negative 03/14/2016 1227   NITRITE NEGATIVE 04/19/2021 1924   LEUKOCYTESUR SMALL (A) 04/19/2021 1924     RADIOLOGY STUDIES/RESULTS: No results found.   LOS: 5 days   Signature  Lala Lund M.D on 04/24/2021 at 12:49 PM   -  To page go to [email or web address]

## 2021-04-24 NOTE — Progress Notes (Signed)
[place] for Heparin Indication: DVT  Allergies  Allergen Reactions   Ciprofloxacin Rash and Other (See Comments)    Localized red rash to IV site; "Allergic," per MAR   Penicillins Rash and Other (See Comments)    Tolerates multiple cephalosporins, including cephalexin; "Allergic to Penicillin," per [facility]    Patient Measurements: Heparin Dosing Weight: TBW (58 kg)  Vital Signs: Temp: 98.9 F (37.2 C) (01/28 1502) Temp Source: Oral (01/28 1502) BP: 141/60 (01/28 1502) Pulse Rate: 80 (01/28 1502)  Labs: Recent Labs    04/22/21 0508 04/22/21 0748 04/23/21 0225 04/23/21 0817 04/24/21 0117 04/24/21 1644  HGB 9.4*  --  9.1*  --  9.0*  --   HCT 29.4*  --  29.3*  --  27.3*  --   PLT 324  --  330  --  343  --   HEPARINUNFRC  --    < >  --  0.31 0.28* 0.29*  CREATININE 1.25*  --  1.30*  --  1.32*  --    < > = values in this interval not displayed.     Estimated Creatinine Clearance: 26.5 mL/min (A) (by C-G formula based on SCr of 1.32 mg/dL (H)).   Assessment: 88 year old female with medical history significant for prior CVA and HFpEF who presented with syncope/confusion found to have acute MCA territory infarct and age indeterminate DVTs. Pharmacy consulted to manage heparin infusion.  Per stroke protocol, no bolus and lower goal range of 0.3-0.5.  Current CBC low stable, no s/sx bleeding reported.   PM Update: Heparin level this evening essentially at goal at 0.29.  No overt bleeding or complications noted.  Goal of Therapy:  Heparin level 0.3-0.5 units/ml Monitor platelets by anticoagulation protocol: Yes   Plan: Continue IV heparin at 900 units/hr, would expect level to rise a bit overnight as heparin accumulates. Daily heparin level and CBC. Follow-up plans for changing to apixaban Continue to monitor H&H and platelets  [place] Crane, Vena Austria, BCPS, BCCP Clinical Pharmacist  04/24/2021 5:21 PM   [facility] pharmacy phone  numbers are listed on [email or web address]

## 2021-04-24 NOTE — TOC Progression Note (Addendum)
Transition of Care [facility]) - Progression Note    Patient Details  Name: Lisa Villegas MRN: [id number] Date of Birth: 1930/07/08  Transition of Care [facility]) CM/SW [place], [place] Phone Number: 04/24/2021, 3:05 PM  Clinical Narrative:    3:45: [name] called back and was concerned about the ratings and asked if after rehab pt could return to ALF instead of staying. CSW confirmed pt is recommended for Short term rehab, and the plan would be for pt to return to prior living arrangements, if able. [name] stated she would have to call and confirm they can take pt back when done with rehab.  CSW contacted pt's legal guardian, [name], to discuss bed choice. [name] states she had not received bed offers that were sent. CSW provided bed offers again and will ask following CSW to call [facility] tomorrow for choice. TOC will continue to follow.   Expected Discharge Plan: [place] Barriers to Discharge: Continued Medical Work up  Expected Discharge Plan and Services Expected Discharge Plan: [place] Choice: [place] arrangements for the past 2 months: [place]                                       Social Determinants of Health (SDOH) Interventions    Readmission Risk Interventions Readmission Risk Prevention Plan 08/01/2019 04/02/2019  Transportation Screening Complete Complete  PCP or Specialist Appt within 3-5 Days Complete Complete  HRI or Home Care Consult Complete Complete  Social Work Consult for [place] Planning/Counseling Complete Complete  Palliative Care Screening Complete Not Applicable  Medication Review Press photographer) Complete Referral to Pharmacy  Some recent data might be hidden

## 2021-04-24 NOTE — Progress Notes (Signed)
[place] for Heparin Indication: DVT  Allergies  Allergen Reactions   Ciprofloxacin Rash and Other (See Comments)    Localized red rash to IV site; "Allergic," per MAR   Penicillins Rash and Other (See Comments)    Tolerates multiple cephalosporins, including cephalexin; "Allergic to Penicillin," per [facility]    Patient Measurements: Heparin Dosing Weight: TBW (58 kg)  Vital Signs: Temp: 98.8 F (37.1 C) (01/28 0724) Temp Source: Oral (01/28 0724) BP: 134/76 (01/28 0724) Pulse Rate: 73 (01/28 0724)  Labs: Recent Labs    04/22/21 0508 04/22/21 0748 04/23/21 0225 04/23/21 0817 04/24/21 0117  HGB 9.4*  --  9.1*  --  9.0*  HCT 29.4*  --  29.3*  --  27.3*  PLT 324  --  330  --  343  HEPARINUNFRC  --  0.40  --  0.31 0.28*  CREATININE 1.25*  --  1.30*  --  1.32*     Estimated Creatinine Clearance: 26.5 mL/min (A) (by C-G formula based on SCr of 1.32 mg/dL (H)).   Assessment: 88 year old female with medical history significant for prior CVA and HFpEF who presented with syncope/confusion found to have acute MCA territory infarct and age indeterminate DVTs. Pharmacy consulted to manage heparin infusion.  Per stroke protocol, no bolus and lower goal range of 0.3-0.5. Heparin level this AM 0.28 which is slightly subtherapeutic on 850 units/hr. Of note, patient was supratherapeutic previously on 1000 units/hr. Will increase slightly to ensure therapeutic. Current CBC low stable, no s/sx bleeding reported.   Goal of Therapy:  Heparin level 0.3-0.5 units/ml Monitor platelets by anticoagulation protocol: Yes   Plan: Increase heparin infusion to 900 units/hr 8-hour confirmatory HL  Check heparin level daily while on heparin Follow-up plans for changing to apixaban Continue to monitor H&H and platelets  Cephus Slater, PharmD, [facility] Pharm[facility] Resident [phone] 04/24/2021 8:17 AM

## 2021-04-25 ENCOUNTER — Inpatient Hospital Stay (HOSPITAL_COMMUNITY): Payer: Medicare Other

## 2021-04-25 LAB — CULTURE, BLOOD (ROUTINE X 2)
Culture: NO GROWTH
Special Requests: ADEQUATE

## 2021-04-25 LAB — CBC
HCT: 30.4 % — ABNORMAL LOW (ref 36.0–46.0)
Hemoglobin: 9.6 g/dL — ABNORMAL LOW (ref 12.0–15.0)
MCH: 27.3 pg (ref 26.0–34.0)
MCHC: 31.6 g/dL (ref 30.0–36.0)
MCV: 86.4 fL (ref 80.0–100.0)
Platelets: 360 10*3/uL (ref 150–400)
RBC: 3.52 MIL/uL — ABNORMAL LOW (ref 3.87–5.11)
RDW: 16.2 % — ABNORMAL HIGH (ref 11.5–15.5)
WBC: 11.2 10*3/uL — ABNORMAL HIGH (ref 4.0–10.5)
nRBC: 0.2 % (ref 0.0–0.2)

## 2021-04-25 LAB — MAGNESIUM: Magnesium: 1.5 mg/dL — ABNORMAL LOW (ref 1.7–2.4)

## 2021-04-25 LAB — GLUCOSE, CAPILLARY
Glucose-Capillary: 118 mg/dL — ABNORMAL HIGH (ref 70–99)
Glucose-Capillary: 128 mg/dL — ABNORMAL HIGH (ref 70–99)
Glucose-Capillary: 129 mg/dL — ABNORMAL HIGH (ref 70–99)
Glucose-Capillary: 130 mg/dL — ABNORMAL HIGH (ref 70–99)
Glucose-Capillary: 132 mg/dL — ABNORMAL HIGH (ref 70–99)
Glucose-Capillary: 175 mg/dL — ABNORMAL HIGH (ref 70–99)

## 2021-04-25 LAB — BASIC METABOLIC PANEL
Anion gap: 8 (ref 5–15)
BUN: 27 mg/dL — ABNORMAL HIGH (ref 8–23)
CO2: 21 mmol/L — ABNORMAL LOW (ref 22–32)
Calcium: 8.3 mg/dL — ABNORMAL LOW (ref 8.9–10.3)
Chloride: 107 mmol/L (ref 98–111)
Creatinine, Ser: 1.3 mg/dL — ABNORMAL HIGH (ref 0.44–1.00)
GFR, Estimated: 40 mL/min — ABNORMAL LOW (ref >60)
Glucose, Bld: 133 mg/dL — ABNORMAL HIGH (ref 70–99)
Potassium: 4.2 mmol/L (ref 3.5–5.1)
Sodium: 136 mmol/L (ref 135–145)

## 2021-04-25 LAB — HEPARIN LEVEL (UNFRACTIONATED): Heparin Unfractionated: 0.32 IU/mL (ref 0.30–0.70)

## 2021-04-25 NOTE — TOC Progression Note (Signed)
Transition of Care [facility]) - Progression Note    Patient Details  Name: [name] MRN: [id number] Date of Birth: 1930/07/27  Transition of Care [facility]) CM/SW [place], [place] Phone Number: 04/25/2021, 12:58 PM  Clinical Narrative:     Patient's Legal Guardian has selected [facility].  [facility] has confirmed availability.   TOC will continue to follow and assist with discharge planning.  Thurmond Butts, MSW, LCSW Clinical Social Worker    Expected Discharge Plan: Skilled Nursing Facility Barriers to Discharge: Continued Medical Work up  Expected Discharge Plan and Services Expected Discharge Plan: [place] Choice: [place] arrangements for the past 2 months: [place]                                       Social Determinants of Health (SDOH) Interventions    Readmission Risk Interventions Readmission Risk Prevention Plan 08/01/2019 04/02/2019  Transportation Screening Complete Complete  PCP or Specialist Appt within 3-5 Days Complete Complete  HRI or Home Care Consult Complete Complete  Social Work Consult for [place] Planning/Counseling Complete Complete  Palliative Care Screening Complete Not Applicable  Medication Review Press photographer) Complete Referral to Pharmacy  Some recent data might be hidden

## 2021-04-25 NOTE — TOC Progression Note (Signed)
Transition of Care [facility]) - Progression Note    Patient Details  Name: Lisa Villegas MRN: [id number] Date of Birth: 1930-09-26  Transition of Care [facility]) CM/SW [place], [place] Phone Number: 04/25/2021, 12:29 PM  Clinical Narrative:     Damaris Schooner with [name] by phone to follow up on SNF choice- she states she will review and call CSW back.  Thurmond Butts, MSW, LCSW Clinical Social Worker    Expected Discharge Plan: Skilled Nursing Facility Barriers to Discharge: Continued Medical Work up  Expected Discharge Plan and Services Expected Discharge Plan: [place] Choice: [place] arrangements for the past 2 months: [place]                                       Social Determinants of Health (SDOH) Interventions    Readmission Risk Interventions Readmission Risk Prevention Plan 08/01/2019 04/02/2019  Transportation Screening Complete Complete  PCP or Specialist Appt within 3-5 Days Complete Complete  HRI or Home Care Consult Complete Complete  Social Work Consult for [place] Planning/Counseling Complete Complete  Palliative Care Screening Complete Not Applicable  Medication Review Press photographer) Complete Referral to Pharmacy  Some recent data might be hidden

## 2021-04-25 NOTE — Progress Notes (Signed)
PROGRESS NOTE        PATIENT DETAILS Name: Lisa Villegas Age: 88 y.o. Sex: female Date of Birth: 06-24-1930 Admit Date: 04/19/2021 Admitting Physician Jonnie Finner, DO MHD:[id number], Ajith, MD  Brief Summary: 88 year old female with history of autism, prior CVA, HFpEF, CKD stage IIIa, DM-2, HTN, HLD-who presented with syncope/confusion-found to have acute MCA territory infarct and bilateral lower extremity DVT.  See below for further details.  Significant events: 1/3-1/12>> hospitalization for AKI/multiple closed fractures of metatarsal of right foot 1/23>>  admit to [facility] for syncope/acute CVA 1/24>>  Transferred to [facility]  Significant studies: 1/23>> CXR: Bilateral pleural effusion 1/23>> MRI brain: Moderate large acute posterior right MCA infarct.  Associated cytotoxic edema 1/24>> MRA head/neck: 70% stenosis left ICA, 60% proximal right ICA, moderate to severe stenosis right P2/P3, severe stenosis of distal left M1, proximal M2. 1/24>> A1c: 6.8 1/24>> LDL: 134 1/25>> Spot EEG: No seizures. 1/25>> carotid Doppler: Right ICA 40-59% stenosis, left ICA 1-39% stenosis 1/25>> lower extremity Dopplers: DVT left posterior tibial/left peroneal vein, age-indeterminate DVT involving the right common femoral, right femoral, right proximal profunda, popliteal, and right posterior tibial vein. 1/25>> Echo: EF 94-17%, grade 1 diastolic dysfunction.  RV systolic function is moderately reduced.  Microbiology data: 1/23>> blood culture: No growth  Subjective:  In bed sleeping, easily arousable, denies any headache or chest pain, due to her stroke unable to answer questions reliably or consistently, unable to follow commands consistently.   Objective: Vitals: Blood pressure (!) 149/76, pulse 79, temperature 98 F (36.7 C), temperature source Oral, resp. rate 20, weight 59.8 kg, SpO2 91 %.   Exam:  Awake alert x1, still has some dysarthria and expressive  aphasia, left arm more than leg weakness, overall generally weak, [place].AT,PERRAL Supple Neck, No JVD,   Symmetrical Chest wall movement, Good air movement bilaterally, CTAB RRR,No Gallops, Rubs or new Murmurs,  +ve B.Sounds, Abd Soft, No tenderness,   No Cyanosis, Clubbing or edema     Pertinent Labs/Radiology: CBC Latest Ref Rng & Units 04/25/2021 04/24/2021 04/23/2021  WBC 4.0 - 10.5 K/uL 11.2(H) 10.0 8.3  Hemoglobin 12.0 - 15.0 g/dL 9.6(L) 9.0(L) 9.1(L)  Hematocrit 36.0 - 46.0 % 30.4(L) 27.3(L) 29.3(L)  Platelets 150 - 400 K/uL 360 343 330    Lab Results  Component Value Date   NA 136 04/25/2021   K 4.2 04/25/2021   CL 107 04/25/2021   CO2 21 (L) 04/25/2021      Assessment/Plan: * Acute posterior right MCA territory infarct- (present on admission) Continues to have dysarthria-but motor exam appears to be consistent with her stroke with weakness left significantly more than right.  Suspicion for PFO-and possible embolic stroke given that she has significant DVT in her lower extremities.  It is also possible that this could be thromboembolic stroke from large vessel disease.  See work-up as above.  Her left upper extremity weakness could be slightly worse today however difficult to examine due to her inability to follow commands consistently.  Will check head CT on 04/25/2021.  On IV heparin-neurology recommending transitioning to oral anticoagulant in a few days.  Given advanced age/frailty-not a candidate for aggressive care including invasive vascular procedures.  Suspect best to manage medically.   Her LDL was above goal and she has been placed on statin for dyslipidemia, A1c was reasonable for her age,  will place her on sliding scale insulin while she is here appears to be borderline DM type II with A1c of 6.8.    Bilateral lower extremity DVT. Likely provoked due to sedentary lifestyle/immobility due to recent fracture-given the fact that she syncopized-and has mild RV systolic  dysfunction-likely has a PE as well.  However she is hemodynamically stable-not hypoxic-further work-up will not change management-as she is already on IV heparin-hence will not pursue CTA chest.  Will probably require indefinite anticoagulation.  This MD discussed with POA-Ms. [name] 1/26-she was understanding and agreeable to this strategy.  Protein-calorie malnutrition, severe- (present on admission) Continue supplements.  Bilateral pleural effusion- (present on admission) Probably related to HFpEF-or from pulmonary embolism.  Given the fact that she is asymptomatic/frail/on anticoagulation-would avoid thoracocentesis is much as possible-as this is highly likely that this is a transudate.  Will need to repeat chest x-rays in the outpatient setting.    Acute renal failure superimposed on stage 3a chronic kidney disease ([place])- (present on admission) AKI likely hemodynamically mediated-improved with supportive care.  Continue to follow electrolytes periodically.  Multiple closed fractures of metatarsal bone of right foot- (present on admission) Recent admission for the same.  Resume outpatient follow-up with orthopedics.   Malnutrition of moderate degree- (present on admission) Continue supplements per dietitian.  SIRS (systemic inflammatory response syndrome) ([place])- (present on admission) Noninfectious etiology-no signs of infection.  Being monitored off antimicrobial therapy.  Follow cultures.    Lactic acidosis- (present on admission) Suspect due to hypoperfusion/dehydration-no indication of infection at this point.  Doubt any clinical significance at this point-has been adequately hydrated.  History of Hodgkin's disease Reported history.  Unable to verify.  FTT (failure to thrive) in adult- (present on admission) Prognosis is guarded in the setting of large stroke.  Appears frail at baseline-with numerous medical problems-do not think she is a good candidate for aggressive  care-DNR in place.Marland Kitchen  Appreciate palliative care evaluation.  Spoke at length with POA [name] on 1/26.  Syncope- (present on admission) Initially thought to be vasovagal or orthostatic etiology-now with extensive DVT-in slightly decreased RV systolic function on echo-suspect was from from VTE/pulmonary embolism.  EEG without seizures.  Already on anticoagulation-further work-up including a CTA chest would not change management-as she is already on anticoagulation.  Hypertensive urgency- (present on admission) BP stable-continue Verapmail, Metoprolol, Hydaralazine  GERD (gastroesophageal reflux disease)- (present on admission) Continue PPI.  Acute metabolic encephalopathy- (present on admission) Probably due to AKI/CVA/possible VTE-she is back to her baseline.  Autism- (present on admission) History of autistic spectrum disorder-suspect mental status back to baseline.  Hyperlipidemia- (present on admission) Continue high intensity statin.  Repeat lipid panel in 3 months.  Type II diabetes mellitus with renal manifestations (A1c 6.8 on 1/24)- (present on admission) CBG stable with SSI.   Recent Labs    04/22/21 2329 04/23/21 0319 04/23/21 0803  GLUCAP 112* 123* 122*    Nutrition Status: Nutrition Problem: Severe Malnutrition Etiology: chronic illness (CHF, h/o CVA) Signs/Symptoms: severe muscle depletion, severe fat depletion Interventions: Ensure Enlive (each supplement provides 350kcal and 20 grams of protein), MVI  BMI: Estimated body mass index is 21.94 kg/m as calculated from the following:   Height as of 04/01/21: 5\' 5"  (1.651 m).   Weight as of this encounter: 59.8 kg.    DVT Prophylaxis: IV Heparin Procedures: None Consults: Neurology Code Status:DNR Family Communication:  Legal gaurdianMarylynn Pearson [phone]-updated on 1/26.   Disposition Plan: Status is: Inpatient  Remains  inpatient appropriate because: CVA-extensive DVT-on IV heparin-will be  transition to oral agent in the next few days-needs SNF on discharge.    Diet: Diet Order             Diet regular Room service appropriate? Yes with Assist; Fluid consistency: Thin  Diet effective now                     Antimicrobial agents: Anti-infectives (From admission, onward)    Start     Dose/Rate Route Frequency Ordered Stop   04/21/21 1600  vancomycin (VANCOCIN) IVPB 1000 mg/200 mL premix  Status:  Discontinued        1,000 mg 200 mL/hr over 60 Minutes Intravenous Every 48 hours 04/19/21 1609 04/20/21 0823   04/19/21 1615  vancomycin (VANCOREADY) IVPB 1250 mg/250 mL        1,250 mg 166.7 mL/hr over 90 Minutes Intravenous  Once 04/19/21 1609 04/19/21 2004   04/19/21 1615  ceFEPIme (MAXIPIME) 2 g in sodium chloride 0.9 % 100 mL IVPB  Status:  Discontinued        2 g 200 mL/hr over 30 Minutes Intravenous Every 12 hours 04/19/21 1611 04/20/21 0823   04/19/21 1330  cefTRIAXone (ROCEPHIN) 2 g in sodium chloride 0.9 % 100 mL IVPB        2 g 200 mL/hr over 30 Minutes Intravenous  Once 04/19/21 1324 04/19/21 1432   04/19/21 1330  azithromycin (ZITHROMAX) 500 mg in sodium chloride 0.9 % 250 mL IVPB        500 mg 250 mL/hr over 60 Minutes Intravenous  Once 04/19/21 1324 04/19/21 1530        MEDICATIONS: Scheduled Meds:  atorvastatin  40 mg Oral Daily   feeding supplement  237 mL Oral BID BM   furosemide  40 mg Oral Daily   hydrALAZINE  50 mg Oral TID   insulin aspart  0-9 Units Subcutaneous Q4H   metoprolol tartrate  12.5 mg Oral BID   multivitamin with minerals  1 tablet Oral Daily   oxybutynin  5 mg Oral BID   pantoprazole (PROTONIX) IV  40 mg Intravenous Q24H   verapamil  120 mg Oral QHS   Continuous Infusions:  heparin 900 Units/hr (04/25/21 0405)   PRN Meds:.   I have personally reviewed following labs and imaging studies  LABORATORY DATA:  Recent Labs  Lab 04/19/21 1314 04/19/21 1321 04/21/21 0421 04/22/21 0508 04/23/21 0225 04/24/21 0117  04/25/21 0036  WBC 11.3*   < > 9.1 9.0 8.3 10.0 11.2*  HGB 10.2*   < > 10.7* 9.4* 9.1* 9.0* 9.6*  HCT 33.2*   < > 34.0* 29.4* 29.3* 27.3* 30.4*  PLT 344   < > 340 324 330 343 360  MCV 90.0   < > 86.1 85.7 86.4 84.8 86.4  MCH 27.6   < > 27.1 27.4 26.8 28.0 27.3  MCHC 30.7   < > 31.5 32.0 31.1 33.0 31.6  RDW 16.2*   < > 15.9* 16.0* 16.1* 15.9* 16.2*  LYMPHSABS 0.9  --  1.3 1.9 2.0  --   --   MONOABS 0.4  --  0.7 0.7 0.6  --   --   EOSABS 0.0  --  0.3 0.2 0.3  --   --   BASOSABS 0.1  --  0.1 0.0 0.1  --   --    < > = values in this interval not displayed.    Recent Labs  Lab  0000 04/19/21 1311 04/19/21 1313 04/19/21 1314 04/19/21 1321 04/19/21 1548 04/19/21 1836 04/19/21 2230 04/20/21 0534 04/21/21 0421 04/22/21 0508 04/23/21 0225 04/24/21 0117 04/25/21 0036  NA  --   --   --  137   < >  --   --   --  134* 132* 135 134* 137 136  K  --   --   --  4.6   < >  --   --   --  4.5 4.0 3.6 3.6 3.7 4.2  CL  --   --   --  105   < >  --   --   --  106 102 106 106 108 107  CO2   < >  --   --  18*  --   --   --   --  21* 18* 20* 18* 20* 21*  GLUCOSE  --   --   --  145*   < >  --   --   --  127* 120* 122* 121* 126* 133*  BUN  --   --   --  29*   < >  --   --   --  24* 13 19 21  24* 27*  CREATININE  --   --   --  1.33*   < >  --   --   --  0.98 0.95 1.25* 1.30* 1.32* 1.30*  CALCIUM   < >  --   --  8.5*  --   --   --   --  8.3* 8.8* 8.4* 8.1* 8.2* 8.3*  AST  --   --   --  26  --   --   --   --  18  --   --   --   --   --   ALT  --   --   --  25  --   --   --   --  28  --   --   --   --   --   ALKPHOS  --   --   --  109  --   --   --   --  100  --   --   --   --   --   BILITOT  --   --   --  0.4  --   --   --   --  0.4  --   --   --   --   --   ALBUMIN  --   --   --  3.0*  --   --   --   --  2.7*  --   --   --   --   --   MG  --   --   --   --   --   --   --   --   --  1.4* 1.3* 1.3*  --  1.5*  PROCALCITON  --   --   --   --   --   --   --   --  <0.10  --   --   --   --   --    LATICACIDVEN  --   --  5.0*  --   --  4.2* 2.4* 1.4  --   --   --   --   --   --   INR  --  1.1  --   --   --   --   --   --  1.1  --   --   --   --   --   HGBA1C  --   --   --   --   --   --   --   --  6.8*  --   --   --   --   --    < > = values in this interval not displayed.   Lab Results  Component Value Date   CHOL 211 (H) 04/20/2021   HDL 53 04/20/2021   LDLCALC 134 (H) 04/20/2021   TRIG 119 04/20/2021   CHOLHDL 4.0 04/20/2021     RADIOLOGY STUDIES/RESULTS: No results found.   LOS: 6 days   Signature  Lala Lund M.D on 04/25/2021 at 9:05 AM   -  To page go to [email or web address]

## 2021-04-25 NOTE — Progress Notes (Signed)
[place] for Heparin Indication: DVT  Allergies  Allergen Reactions   Ciprofloxacin Rash and Other (See Comments)    Localized red rash to IV site; "Allergic," per MAR   Penicillins Rash and Other (See Comments)    Tolerates multiple cephalosporins, including cephalexin; "Allergic to Penicillin," per [facility]    Patient Measurements: Heparin Dosing Weight: TBW (58 kg)  Vital Signs: Temp: 98.3 F (36.8 C) (01/29 0320) Temp Source: Oral (01/29 0320) BP: 155/67 (01/29 0320) Pulse Rate: 87 (01/29 0320)  Labs: Recent Labs    04/23/21 0225 04/23/21 0817 04/24/21 0117 04/24/21 1644 04/25/21 0036  HGB 9.1*  --  9.0*  --  9.6*  HCT 29.3*  --  27.3*  --  30.4*  PLT 330  --  343  --  360  HEPARINUNFRC  --    < > 0.28* 0.29* 0.32  CREATININE 1.30*  --  1.32*  --  1.30*   < > = values in this interval not displayed.     Estimated Creatinine Clearance: 26.9 mL/min (A) (by C-G formula based on SCr of 1.3 mg/dL (H)).   Assessment: 88 year old female with medical history significant for prior CVA and HFpEF who presented with syncope/confusion found to have acute MCA territory infarct and age indeterminate DVTs. Pharmacy consulted to manage heparin infusion.  Per stroke protocol, no bolus and lower goal range of 0.3-0.5.  Current CBC low stable, no s/sx bleeding reported. HL this AM is 0.32 which is at goal. Hgb and Plts stable this AM.  Goal of Therapy:  Heparin level 0.3-0.5 units/ml Monitor platelets by anticoagulation protocol: Yes   Plan: Continue IV heparin at 900 units/hr Daily heparin level and CBC. Follow-up plans for changing to apixaban Continue to monitor H&H, platelets, and s/sx of bleed   Cephus Slater, PharmD, [facility] Pharmacy Resident [phone] 04/25/2021 7:15 AM

## 2021-04-26 ENCOUNTER — Other Ambulatory Visit: Payer: Self-pay

## 2021-04-26 LAB — COMPREHENSIVE METABOLIC PANEL
ALT: 15 U/L (ref 0–44)
AST: 15 U/L (ref 15–41)
Albumin: 2.4 g/dL — ABNORMAL LOW (ref 3.5–5.0)
Alkaline Phosphatase: 96 U/L (ref 38–126)
Anion gap: 10 (ref 5–15)
BUN: 24 mg/dL — ABNORMAL HIGH (ref 8–23)
CO2: 21 mmol/L — ABNORMAL LOW (ref 22–32)
Calcium: 8.5 mg/dL — ABNORMAL LOW (ref 8.9–10.3)
Chloride: 105 mmol/L (ref 98–111)
Creatinine, Ser: 1.25 mg/dL — ABNORMAL HIGH (ref 0.44–1.00)
GFR, Estimated: 41 mL/min — ABNORMAL LOW (ref >60)
Glucose, Bld: 134 mg/dL — ABNORMAL HIGH (ref 70–99)
Potassium: 3.9 mmol/L (ref 3.5–5.1)
Sodium: 136 mmol/L (ref 135–145)
Total Bilirubin: 0.3 mg/dL (ref 0.3–1.2)
Total Protein: 5.4 g/dL — ABNORMAL LOW (ref 6.5–8.1)

## 2021-04-26 LAB — CBC WITH DIFFERENTIAL/PLATELET
Abs Immature Granulocytes: 0.39 10*3/uL — ABNORMAL HIGH (ref 0.00–0.07)
Basophils Absolute: 0.1 10*3/uL (ref 0.0–0.1)
Basophils Relative: 1 %
Eosinophils Absolute: 0.3 10*3/uL (ref 0.0–0.5)
Eosinophils Relative: 3 %
HCT: 29.4 % — ABNORMAL LOW (ref 36.0–46.0)
Hemoglobin: 9.2 g/dL — ABNORMAL LOW (ref 12.0–15.0)
Immature Granulocytes: 4 %
Lymphocytes Relative: 21 %
Lymphs Abs: 2.1 10*3/uL (ref 0.7–4.0)
MCH: 27 pg (ref 26.0–34.0)
MCHC: 31.3 g/dL (ref 30.0–36.0)
MCV: 86.2 fL (ref 80.0–100.0)
Monocytes Absolute: 0.7 10*3/uL (ref 0.1–1.0)
Monocytes Relative: 7 %
Neutro Abs: 6.4 10*3/uL (ref 1.7–7.7)
Neutrophils Relative %: 64 %
Platelets: 357 10*3/uL (ref 150–400)
RBC: 3.41 MIL/uL — ABNORMAL LOW (ref 3.87–5.11)
RDW: 16.2 % — ABNORMAL HIGH (ref 11.5–15.5)
WBC: 10 10*3/uL (ref 4.0–10.5)
nRBC: 0.2 % (ref 0.0–0.2)

## 2021-04-26 LAB — GLUCOSE, CAPILLARY
Glucose-Capillary: 112 mg/dL — ABNORMAL HIGH (ref 70–99)
Glucose-Capillary: 113 mg/dL — ABNORMAL HIGH (ref 70–99)
Glucose-Capillary: 116 mg/dL — ABNORMAL HIGH (ref 70–99)
Glucose-Capillary: 123 mg/dL — ABNORMAL HIGH (ref 70–99)
Glucose-Capillary: 130 mg/dL — ABNORMAL HIGH (ref 70–99)
Glucose-Capillary: 157 mg/dL — ABNORMAL HIGH (ref 70–99)

## 2021-04-26 LAB — HEPARIN LEVEL (UNFRACTIONATED): Heparin Unfractionated: 0.32 [IU]/mL (ref 0.30–0.70)

## 2021-04-26 LAB — RESP PANEL BY RT-PCR (FLU A&B, COVID) ARPGX2
Influenza A by PCR: NEGATIVE
Influenza B by PCR: NEGATIVE
SARS Coronavirus 2 by RT PCR: NEGATIVE

## 2021-04-26 LAB — BRAIN NATRIURETIC PEPTIDE: B Natriuretic Peptide: 304.8 pg/mL — ABNORMAL HIGH (ref 0.0–100.0)

## 2021-04-26 LAB — MAGNESIUM: Magnesium: 1.4 mg/dL — ABNORMAL LOW (ref 1.7–2.4)

## 2021-04-26 MED ORDER — APIXABAN 2.5 MG PO TABS
2.5000 mg | ORAL_TABLET | Freq: Two times a day (BID) | ORAL | Status: DC
  Administered 2021-04-26 – 2021-04-27 (×3): 2.5 mg via ORAL
  Filled 2021-04-26 (×3): qty 1

## 2021-04-26 MED ORDER — LACTATED RINGERS IV SOLN: INTRAVENOUS | Status: AC

## 2021-04-26 MED ORDER — MAGNESIUM SULFATE 4 GM/100ML IV SOLN
4.0000 g | Freq: Once | INTRAVENOUS | Status: AC
  Administered 2021-04-26: 4 g via INTRAVENOUS
  Filled 2021-04-26 (×2): qty 100

## 2021-04-26 NOTE — Progress Notes (Signed)
Physical Therapy Treatment Patient Details Name: Lisa Villegas MRN: [id number] DOB: January 02, 1931 Today's Date: 04/26/2021   History of Present Illness Lisa Villegas is a 88 y.o. female Presenting with syncope from ALF on 04/19/21. MRI-Moderately large acute to early subacute posterior right MCA  infarct. PMH: chronic diastolic HF, AST4H, DM2, HTN, HLD.autism, recent R metatarsal fx    PT Comments    Pt was able to tolerate and participate more in therapy with less resistance to mobility.  Emphasis on transition to EOB, sitting balance/truncal activation with 2 standing trials.    Recommendations for follow up therapy are one component of a multi-disciplinary discharge planning process, led by the attending physician.  Recommendations may be updated based on patient status, additional functional criteria and insurance authorization.  Follow Up Recommendations  Skilled nursing-short term rehab (<3 hours/day)     Assistance Recommended at Discharge Frequent or constant Supervision/Assistance  Patient can return home with the following Two people to help with walking and/or transfers;A lot of help with bathing/dressing/bathroom;Assistance with cooking/housework;Direct supervision/assist for medications management;Assist for transportation;Direct supervision/assist for financial management   Equipment Recommendations  None recommended by PT    Recommendations for Other Services       Precautions / Restrictions Precautions Precautions: Fall Precaution Comments: Autistic, Difficulty following directions Splint/Cast: post op shoe on right foot Restrictions RLE Weight Bearing: Weight bearing as tolerated     Mobility  Bed Mobility Overal bed mobility: Needs Assistance Bed Mobility: Supine to Sit, Sit to Supine     Supine to sit: Total assist, +2 for physical assistance, Max assist Sit to supine: +2 for physical assistance, Max assist   General bed mobility comments:  increased time, directional cues throughout, environmental cues to help decrease her anxiety and resistance to movement    Transfers Overall transfer level: Needs assistance Equipment used: 2 person hand held assist Transfers: Sit to/from Stand Sit to Stand: Max assist, +2 physical assistance (x2)           General transfer comment: face to face assist of 2 to come into full upright stand by 2nd trial and able to back off to give pt feel she was doing more.    Ambulation/Gait               General Gait Details: NT   Stairs             Wheelchair Mobility    Modified Rankin (Stroke Patients Only) Modified Rankin (Stroke Patients Only) Pre-Morbid Rankin Score: Severe disability Modified Rankin: Severe disability     Balance Overall balance assessment: Needs assistance Sitting-balance support: Feet supported Sitting balance-Leahy Scale: Poor Sitting balance - Comments: sat EOB x8-10 min working on trunk control and activation.  pt never able to actively come forward or stay upright without significant truncal assist   Standing balance support: Bilateral upper extremity supported Standing balance-Leahy Scale: Poor Standing balance comment: face to face assist to stand x2                            Cognition Arousal/Alertness: Awake/alert Behavior During Therapy: Flat affect Overall Cognitive Status: History of cognitive impairments - at baseline                                 General Comments: needs time and multimodal cues to follow commands about 60% of the time; extremely  fearful/anxious with all mobility; very limited verbal communication        Exercises Other Exercises Other Exercises: warm up ROM to bil hip/knees prior to mobility    General Comments        Pertinent Vitals/Pain Pain Assessment Pain Assessment: Faces Faces Pain Scale: No hurt Pain Intervention(s): Monitored during session    Home Living  Family/patient expects to be discharged to:: Assisted living     Type of Home: Assisted living             Additional Comments: [facility]'d [facility].    Prior Function            PT Goals (current goals can now be found in the care plan section) Acute Rehab PT Goals PT Goal Formulation: Patient unable to participate in goal setting Time For Goal Achievement: 05/04/21 Potential to Achieve Goals: Fair Progress towards PT goals: Progressing toward goals (slow progression)    Frequency    Min 3X/week      PT Plan Current plan remains appropriate    Co-evaluation              AM-PAC PT "6 Clicks" Mobility   Outcome Measure  Help needed turning from your back to your side while in a flat bed without using bedrails?: Total Help needed moving from lying on your back to sitting on the side of a flat bed without using bedrails?: Total Help needed moving to and from a bed to a chair (including a wheelchair)?: Total Help needed standing up from a chair using your arms (e.g., wheelchair or bedside chair)?: Total Help needed to walk in hospital room?: Total Help needed climbing 3-5 steps with a railing? : Total 6 Click Score: 6    End of Session   Activity Tolerance: Patient tolerated treatment well;Patient limited by fatigue Patient left: in bed;with call bell/phone within reach;with bed alarm set Nurse Communication: Mobility status PT Visit Diagnosis: History of falling (Z91.81);Other abnormalities of gait and mobility (R26.89)     Time: 6754-4920 PT Time Calculation (min) (ACUTE ONLY): 23 min  Charges:  $Therapeutic Activity: 8-22 mins                     04/26/2021  Lisa Villegas., PT Acute Rehabilitation Services [phone]  (pager) [phone]  (office)   [name] 04/26/2021, 5:39 PM

## 2021-04-26 NOTE — Progress Notes (Signed)
PROGRESS NOTE        PATIENT DETAILS Name: Lisa Villegas Age: 88 y.o. Sex: female Date of Birth: 1930-10-26 Admit Date: 04/19/2021 Admitting Physician Jonnie Finner, DO IRJ:[id number], Ajith, MD  Brief Summary: 88 year old female with history of autism, prior CVA, HFpEF, CKD stage IIIa, DM-2, HTN, HLD-who presented with syncope/confusion-found to have acute MCA territory infarct and bilateral lower extremity DVT.  See below for further details.  Significant events: 1/3-1/12>> hospitalization for AKI/multiple closed fractures of metatarsal of right foot 1/23>>  admit to [facility] for syncope/acute CVA 1/24>>  Transferred to [facility]  Significant studies: 1/23>> CXR: Bilateral pleural effusion 1/23>> MRI brain: Moderate large acute posterior right MCA infarct.  Associated cytotoxic edema 1/24>> MRA head/neck: 70% stenosis left ICA, 60% proximal right ICA, moderate to severe stenosis right P2/P3, severe stenosis of distal left M1, proximal M2. 1/24>> A1c: 6.8 1/24>> LDL: 134 1/25>> Spot EEG: No seizures. 1/25>> carotid Doppler: Right ICA 40-59% stenosis, left ICA 1-39% stenosis 1/25>> lower extremity Dopplers: DVT left posterior tibial/left peroneal vein, age-indeterminate DVT involving the right common femoral, right femoral, right proximal profunda, popliteal, and right posterior tibial vein. 1/25>> Echo: EF 60-10%, grade 1 diastolic dysfunction.  RV systolic function is moderately reduced. 1/29>> CT head.  Nonacute.  Microbiology data: 1/23>> blood culture: No growth  Subjective:  In bed sitting in front of breakfast, denies any headache chest or abdominal pain, chronic weakness all over, mildly confused and unreliable historian, does not answer questions or follow commands reliably   Objective: Vitals: Blood pressure (!) 155/79, pulse 73, temperature 98.2 F (36.8 C), temperature source Oral, resp. rate 18, height 5\' 5"  (1.651 m), weight 57.7 kg, SpO2  96 %.   Exam:  Awake alert x1, still has some dysarthria and expressive aphasia, left arm more than leg weakness, overall generally weak, poor participation in exam [place].AT,PERRAL Supple Neck, No JVD,   Symmetrical Chest wall movement, Good air movement bilaterally, CTAB RRR,No Gallops, Rubs or new Murmurs,  +ve B.Sounds, Abd Soft, No tenderness,   No Cyanosis, Clubbing or edema      Pertinent Labs/Radiology: CBC Latest Ref Rng & Units 04/26/2021 04/25/2021 04/24/2021  WBC 4.0 - 10.5 K/uL 10.0 11.2(H) 10.0  Hemoglobin 12.0 - 15.0 g/dL 9.2(L) 9.6(L) 9.0(L)  Hematocrit 36.0 - 46.0 % 29.4(L) 30.4(L) 27.3(L)  Platelets 150 - 400 K/uL 357 360 343    Lab Results  Component Value Date   NA 136 04/26/2021   K 3.9 04/26/2021   CL 105 04/26/2021   CO2 21 (L) 04/26/2021      Assessment/Plan: * Acute posterior right MCA territory infarct- (present on admission) Continues to have dysarthria-but motor exam appears to be consistent with her stroke with weakness left significantly more than right.  Suspicion for PFO-and possible embolic stroke given that she has significant DVT in her lower extremities.  It is also possible that this could be thromboembolic stroke from large vessel disease.  See work-up as above.  Continue supportive care we will transition from heparin to Eliquis on 04/26/2021 if tolerates well then discharged to SNF.  Repeat CT scan done on 04/25/2021 is nonacute  Given advanced age/frailty-not a candidate for aggressive care including invasive vascular procedures.  Suspect best to manage medically.   Her LDL was above goal and she has been placed on statin for dyslipidemia, A1c  was reasonable for her age, will place her on sliding scale insulin while she is here appears to be borderline DM type II with A1c of 6.8.    Bilateral lower extremity DVT. Likely provoked due to sedentary lifestyle/immobility due to recent fracture-given the fact that she syncopized-and has mild RV  systolic dysfunction-likely has a PE as well.  However she is hemodynamically stable-not hypoxic-further work-up will not change management-as she is already on IV heparin-hence will not pursue CTA chest.  Will probably require indefinite anticoagulation.  This MD discussed with POA-Ms. [name] 1/26-she was understanding and agreeable to this strategy.  Protein-calorie malnutrition, severe- (present on admission) Continue supplements.  Bilateral pleural effusion- (present on admission) Probably related to HFpEF-or from pulmonary embolism.  Given the fact that she is asymptomatic/frail/on anticoagulation-would avoid thoracocentesis is much as possible-as this is highly likely that this is a transudate.  Will need to repeat chest x-rays in the outpatient setting.    Acute renal failure superimposed on stage 3a chronic kidney disease ([place])- (present on admission) AKI likely hemodynamically mediated-improved with supportive care.  Continue to follow electrolytes periodically.  Multiple closed fractures of metatarsal bone of right foot- (present on admission) Recent admission for the same.  Resume outpatient follow-up with orthopedics.   Malnutrition of moderate degree- (present on admission) Continue supplements per dietitian.  SIRS (systemic inflammatory response syndrome) ([place])- (present on admission) Noninfectious etiology-no signs of infection.  Being monitored off antimicrobial therapy.  Follow cultures.    Lactic acidosis- (present on admission) Suspect due to hypoperfusion/dehydration-no indication of infection at this point.  Doubt any clinical significance at this point-has been adequately hydrated.  History of Hodgkin's disease Reported history.  Unable to verify.  FTT (failure to thrive) in adult- (present on admission) Prognosis is guarded in the setting of large stroke.  Appears frail at baseline-with numerous medical problems-do not think she is a good candidate for  aggressive care-DNR in place.Marland Kitchen  Appreciate palliative care evaluation.  Spoke at length with POA [name] on 1/26.  Syncope- (present on admission) Initially thought to be vasovagal or orthostatic etiology-now with extensive DVT-in slightly decreased RV systolic function on echo-suspect was from from VTE/pulmonary embolism.  EEG without seizures.  Already on anticoagulation-further work-up including a CTA chest would not change management-as she is already on anticoagulation.  Hypertensive urgency- (present on admission) BP stable-continue Verapmail, Metoprolol, Hydaralazine  GERD (gastroesophageal reflux disease)- (present on admission) Continue PPI.  Acute metabolic encephalopathy- (present on admission) Probably due to AKI/CVA/possible VTE-she is back to her baseline.  Autism- (present on admission) History of autistic spectrum disorder-suspect mental status back to baseline.  Hyperlipidemia- (present on admission) Continue high intensity statin.  Repeat lipid panel in 3 months.  Type II diabetes mellitus with renal manifestations (A1c 6.8 on 1/24)- (present on admission) CBG stable with SSI.   Recent Labs    04/22/21 2329 04/23/21 0319 04/23/21 0803  GLUCAP 112* 123* 122*    Nutrition Status: Nutrition Problem: Severe Malnutrition Etiology: chronic illness (CHF, h/o CVA) Signs/Symptoms: severe muscle depletion, severe fat depletion Interventions: Ensure Enlive (each supplement provides 350kcal and 20 grams of protein), MVI  BMI: Estimated body mass index is 21.17 kg/m as calculated from the following:   Height as of this encounter: 5\' 5"  (1.651 m).   Weight as of this encounter: 57.7 kg.    DVT Prophylaxis: IV Heparin Procedures: None Consults: Neurology Code Status:DNR Family Communication:  Legal gaurdianMarylynn Pearson [phone]-updated on 1/26.   Disposition  Plan: Status is: Inpatient  Remains inpatient appropriate because: CVA-extensive DVT-on IV  heparin->> Eliquis today, if stable SNF 04/27/21    Diet: Diet Order             Diet regular Room service appropriate? Yes with Assist; Fluid consistency: Thin  Diet effective now                     Antimicrobial agents: Anti-infectives (From admission, onward)    Start     Dose/Rate Route Frequency Ordered Stop   04/21/21 1600  vancomycin (VANCOCIN) IVPB 1000 mg/200 mL premix  Status:  Discontinued        1,000 mg 200 mL/hr over 60 Minutes Intravenous Every 48 hours 04/19/21 1609 04/20/21 0823   04/19/21 1615  vancomycin (VANCOREADY) IVPB 1250 mg/250 mL        1,250 mg 166.7 mL/hr over 90 Minutes Intravenous  Once 04/19/21 1609 04/19/21 2004   04/19/21 1615  ceFEPIme (MAXIPIME) 2 g in sodium chloride 0.9 % 100 mL IVPB  Status:  Discontinued        2 g 200 mL/hr over 30 Minutes Intravenous Every 12 hours 04/19/21 1611 04/20/21 0823   04/19/21 1330  cefTRIAXone (ROCEPHIN) 2 g in sodium chloride 0.9 % 100 mL IVPB        2 g 200 mL/hr over 30 Minutes Intravenous  Once 04/19/21 1324 04/19/21 1432   04/19/21 1330  azithromycin (ZITHROMAX) 500 mg in sodium chloride 0.9 % 250 mL IVPB        500 mg 250 mL/hr over 60 Minutes Intravenous  Once 04/19/21 1324 04/19/21 1530        MEDICATIONS: Scheduled Meds:  atorvastatin  40 mg Oral Daily   feeding supplement  237 mL Oral BID BM   furosemide  40 mg Oral Daily   hydrALAZINE  50 mg Oral TID   insulin aspart  0-9 Units Subcutaneous Q4H   metoprolol tartrate  12.5 mg Oral BID   multivitamin with minerals  1 tablet Oral Daily   oxybutynin  5 mg Oral BID   pantoprazole (PROTONIX) IV  40 mg Intravenous Q24H   verapamil  120 mg Oral QHS   Continuous Infusions:  heparin 900 Units/hr (04/25/21 2352)   magnesium sulfate bolus IVPB     PRN Meds:.   I have personally reviewed following labs and imaging studies  LABORATORY DATA:  Recent Labs  Lab 04/19/21 1314 04/19/21 1321 04/21/21 0421 04/22/21 0508 04/23/21 0225  04/24/21 0117 04/25/21 0036 04/26/21 0313  WBC 11.3*   < > 9.1 9.0 8.3 10.0 11.2* 10.0  HGB 10.2*   < > 10.7* 9.4* 9.1* 9.0* 9.6* 9.2*  HCT 33.2*   < > 34.0* 29.4* 29.3* 27.3* 30.4* 29.4*  PLT 344   < > 340 324 330 343 360 357  MCV 90.0   < > 86.1 85.7 86.4 84.8 86.4 86.2  MCH 27.6   < > 27.1 27.4 26.8 28.0 27.3 27.0  MCHC 30.7   < > 31.5 32.0 31.1 33.0 31.6 31.3  RDW 16.2*   < > 15.9* 16.0* 16.1* 15.9* 16.2* 16.2*  LYMPHSABS 0.9  --  1.3 1.9 2.0  --   --  2.1  MONOABS 0.4  --  0.7 0.7 0.6  --   --  0.7  EOSABS 0.0  --  0.3 0.2 0.3  --   --  0.3  BASOSABS 0.1  --  0.1 0.0 0.1  --   --  0.1   < > = values in this interval not displayed.    Recent Labs  Lab  0000 04/19/21 1311 04/19/21 1313 04/19/21 1314 04/19/21 1321 04/19/21 1548 04/19/21 1836 04/19/21 2230 04/20/21 0534 04/21/21 0421 04/22/21 0508 04/23/21 0225 04/24/21 0117 04/25/21 0036 04/26/21 0313  NA  --   --   --  137   < >  --   --   --  134* 132* 135 134* 137 136 136  K  --   --   --  4.6   < >  --   --   --  4.5 4.0 3.6 3.6 3.7 4.2 3.9  CL  --   --   --  105   < >  --   --   --  106 102 106 106 108 107 105  CO2   < >  --   --  18*  --   --   --   --  21* 18* 20* 18* 20* 21* 21*  GLUCOSE  --   --   --  145*   < >  --   --   --  127* 120* 122* 121* 126* 133* 134*  BUN  --   --   --  29*   < >  --   --   --  24* 13 19 21  24* 27* 24*  CREATININE  --   --   --  1.33*   < >  --   --   --  0.98 0.95 1.25* 1.30* 1.32* 1.30* 1.25*  CALCIUM   < >  --   --  8.5*  --   --   --   --  8.3* 8.8* 8.4* 8.1* 8.2* 8.3* 8.5*  AST  --   --   --  26  --   --   --   --  18  --   --   --   --   --  15  ALT  --   --   --  25  --   --   --   --  28  --   --   --   --   --  15  ALKPHOS  --   --   --  109  --   --   --   --  100  --   --   --   --   --  96  BILITOT  --   --   --  0.4  --   --   --   --  0.4  --   --   --   --   --  0.3  ALBUMIN  --   --   --  3.0*  --   --   --   --  2.7*  --   --   --   --   --  2.4*  MG  --   --    --   --   --   --   --   --   --  1.4* 1.3* 1.3*  --  1.5* 1.4*  PROCALCITON  --   --   --   --   --   --   --   --  <0.10  --   --   --   --   --   --   LATICACIDVEN  --   --  5.0*  --   --  4.2* 2.4* 1.4  --   --   --   --   --   --   --   INR  --  1.1  --   --   --   --   --   --  1.1  --   --   --   --   --   --   HGBA1C  --   --   --   --   --   --   --   --  6.8*  --   --   --   --   --   --   BNP  --   --   --   --   --   --   --   --   --   --   --   --   --   --  304.8*   < > = values in this interval not displayed.   Lab Results  Component Value Date   CHOL 211 (H) 04/20/2021   HDL 53 04/20/2021   LDLCALC 134 (H) 04/20/2021   TRIG 119 04/20/2021   CHOLHDL 4.0 04/20/2021     RADIOLOGY STUDIES/RESULTS: CT HEAD WO CONTRAST (5MM)  Result Date: 04/25/2021 CLINICAL DATA:  Stroke follow-up EXAM: CT HEAD WITHOUT CONTRAST TECHNIQUE: Contiguous axial images were obtained from the base of the skull through the vertex without intravenous contrast. RADIATION DOSE REDUCTION: This exam was performed according to the departmental dose-optimization program which includes automated exposure control, adjustment of the mA and/or kV according to patient size and/or use of iterative reconstruction technique. COMPARISON:  Brain MRI from 6 days ago FINDINGS: Brain: Recent infarct centered in the right posterior temporal lobe. No acute hemorrhage or visible infarct progression. Chronic infarct at the left insula and frontal operculum. No hydrocephalus, collection, or masslike finding. Vascular: No hyperdense vessel or unexpected calcification. Skull: Normal. Negative for fracture or focal lesion. Sinuses/Orbits: No acute finding. IMPRESSION: 1. Recent right MCA branch infarct without hemorrhage or visible progression 2. Remote left MCA branch infarct. Electronically Signed   By: Jorje Guild M.D.   On: 04/25/2021 10:35     LOS: 7 days   Signature  Lala Lund M.D on 04/26/2021 at 8:48 AM   -  To  page go to [email or web address]

## 2021-04-26 NOTE — Discharge Instructions (Addendum)
Follow with Primary MD [name], MD in 7 days   Get CBC, CMP, Magnesium, 2 view Chest X ray -  checked next visit within 1 week by Primary MD or SNF MD    Activity: As tolerated with Full fall precautions use walker/cane & assistance as needed  Disposition Home    Diet: Heart Healthy with feeding assistance and aspiration precautions.   Special Instructions: If you have smoked or chewed Tobacco  in the last 2 yrs please stop smoking, stop any regular Alcohol  and or any Recreational drug use.  On your next visit with your primary care physician please Get Medicines reviewed and adjusted.  Please request your Prim.MD to go over all Hospital Tests and Procedure/Radiological results at the follow up, please get all Hospital records sent to your Prim MD by signing hospital release before you go home.  If you experience worsening of your admission symptoms, develop shortness of breath, life threatening emergency, suicidal or homicidal thoughts you must seek medical attention immediately by calling 911 or calling your MD immediately  if symptoms less severe.  You Must read complete instructions/literature along with all the possible adverse reactions/side effects for all the Medicines you take and that have been prescribed to you. Take any new Medicines after you have completely understood and accpet all the possible adverse reactions/side effects.

## 2021-04-26 NOTE — Progress Notes (Signed)
Occupational Therapy Treatment Patient Details Name: Lisa Villegas MRN: [id number] DOB: 1930/06/17 Today's Date: 04/26/2021   History of present illness Lisa Villegas is a 88 y.o. female Presenting with syncope from ALF on 04/19/21. MRI-Moderately large acute to early subacute posterior right MCA  infarct. PMH: chronic diastolic HF, ZHY8M, DM2, HTN, HLD.autism, recent R metatarsal fx   OT comments  Pt with improved functional participation in therapy this session. Ultimately she required total A +2 to transfer to sitting EOB with extreme resistance and pushing initially, with prolong sitting and support pt began to relax and come forward. 2x sit<>stand given max A +2 with a full upright stand obtained the second time. Utilized classical music throughout session for relaxation. D/c remains appropriate, OT to continue to follow acutely.    Recommendations for follow up therapy are one component of a multi-disciplinary discharge planning process, led by the attending physician.  Recommendations may be updated based on patient status, additional functional criteria and insurance authorization.    Follow Up Recommendations  Other (comment)    Assistance Recommended at Discharge Frequent or constant Supervision/Assistance  Patient can return home with the following  A lot of help with bathing/dressing/bathroom;Two people to help with walking and/or transfers   Equipment Recommendations  None recommended by OT       Precautions / Restrictions Precautions Precautions: Fall Precaution Comments: Autistic, Difficulty following directions Required Braces or Orthoses: Other Brace Splint/Cast: post op shoe on right foot Other Brace: darco shoe on R foot Restrictions Weight Bearing Restrictions: Yes RLE Weight Bearing: Weight bearing as tolerated       Mobility Bed Mobility Overal bed mobility: Needs Assistance Bed Mobility: Supine to Sit, Sit to Supine     Supine to sit: Total  assist, +2 for physical assistance, Max assist Sit to supine: +2 for physical assistance, Max assist   General bed mobility comments: increased time, directional cues throughout, environmental cues to help decrease her anxiety and resistance to movement    Transfers Overall transfer level: Needs assistance Equipment used: 2 person hand held assist Transfers: Sit to/from Stand Sit to Stand: Max assist, +2 physical assistance           General transfer comment: face to face assist of 2 to come into full upright stand by 2nd trial and able to back off to give pt feel she was doing more.     Balance Overall balance assessment: Needs assistance Sitting-balance support: Feet supported Sitting balance-Leahy Scale: Poor Sitting balance - Comments: sat EOB x8-10 min working on trunk control and activation.  pt never able to actively come forward or stay upright without significant truncal assist   Standing balance support: Bilateral upper extremity supported Standing balance-Leahy Scale: Poor Standing balance comment: face to face assist to stand x2                           ADL either performed or assessed with clinical judgement   ADL Overall ADL's : Needs assistance/impaired                                     Functional mobility during ADLs: Total assistance General ADL Comments: session focused on sitting, bringing pt forward in sitting, reaching out of BOS and standing  as precursor to ADLs OOB    Extremity/Trunk Assessment Upper Extremity Assessment Upper Extremity Assessment: RUE  deficits/detail;LUE deficits/detail RUE Deficits / Details: using to push/pull, not following commands well to assess functionally LUE Deficits / Details: uses to push/pull, good grip strength   Lower Extremity Assessment Lower Extremity Assessment: Defer to PT evaluation        Vision   Vision Assessment?: No apparent visual deficits   Perception  Perception Perception: Not tested   Praxis Praxis Praxis: Not tested    Cognition Arousal/Alertness: Awake/alert Behavior During Therapy: Flat affect Overall Cognitive Status: History of cognitive impairments - at baseline                     General Comments: needs time and multimodal cues to follow commands about 60% of the time; extremely fearful/anxious with all mobility; very limited verbal communication              General Comments SpO2 >90% on RA, placed back pn 1L [place] at the end of the session    Pertinent Vitals/ Pain       Pain Assessment Pain Assessment: Faces Faces Pain Scale: No hurt Pain Intervention(s): Monitored during session  Home Living Family/patient expects to be discharged to:: Assisted living     Type of Home: Assisted living                           Additional Comments: [facility].          Frequency  Min 2X/week        Progress Toward Goals  OT Goals(current goals can now be found in the care plan section)     Acute Rehab OT Goals Patient Stated Goal: unable to state OT Goal Formulation: Patient unable to participate in goal setting Time For Goal Achievement: 05/04/21 Potential to Achieve Goals: Fair ADL Goals Pt Will Transfer to Toilet: ambulating;with min assist;regular height toilet;grab bars Pt Will Perform Toileting - Clothing Manipulation and hygiene: with max assist;sit to/from stand Additional ADL Goal #1: Patient will participate in 5 minutes of OOB activity in order to participate in daily routine.  Plan Discharge plan remains appropriate;Frequency remains appropriate       AM-PAC OT "6 Clicks" Daily Activity     Outcome Measure   Help from another person eating meals?: A Little Help from another person taking care of personal grooming?: A Little Help from another person toileting, which includes using toliet, bedpan, or urinal?: Total Help from another person bathing  (including washing, rinsing, drying)?: A Lot Help from another person to put on and taking off regular upper body clothing?: A Lot Help from another person to put on and taking off regular lower body clothing?: Total 6 Click Score: 12    End of Session Equipment Utilized During Treatment: Oxygen  OT Visit Diagnosis: Other symptoms and signs involving cognitive function   Activity Tolerance Patient tolerated treatment well   Patient Left in bed;with call bell/phone within reach;with bed alarm set   Nurse Communication Mobility status        Time: 2979-8921 OT Time Calculation (min): 23 min  Charges: OT General Charges $OT Visit: 1 Visit OT Treatments $Therapeutic Activity: 8-22 mins   Darthula Desa A Rainah Kirshner 04/26/2021, 7:52 PM

## 2021-04-26 NOTE — Progress Notes (Signed)
Speech Language Pathology Treatment: Dysphagia;Cognitive-Linquistic  Patient Details Name: Lisa Villegas MRN: [id number] DOB: 12-Jul-1930 Today's Date: 04/26/2021 Time: [id number] SLP Time Calculation (min) (ACUTE ONLY): 19 min  Assessment / Plan / Recommendation Clinical Impression  SWALLOWING Pt in room with AM meal unconsumed.  NT reports pt states she is not hungry.   Pt took a couple of sips of thin liquid from straw with SLP.  Pt refused or turned away from all other POs.  COGNITIVE-LINGUISTIC Pt was not receptive to treatment today.  Asked pt to participate in tasks in different ways including offering a test.  Pt only provided her name.  Pt did not answer calculation questions, yes/no questions, or visual pattern/construction task.  SLP attempted to engage pt in conversation around her personal history and interests. SLP attempted some more complex questions in case pt deemed SLP activities too simple or boring. Pt remained largely unresponsive.  SLP repositioned pt in bed with NT assistance.  Pt did not follow directions and required max tactile cuing.  It is unclear if pt is having difficulty understanding, or does not wish to participate.  SLP will continue to follow while in house.   HPI HPI: Pt is an 88 y.o. female who presented to the ED from ALF for acute onset weakness and hypoxia. MRI brain 04/19/21: Moderately large acute to early subacute posterior right MCA infarct. Acute metabolic encephalopathy present on admission; EMS reported mentation to be at baseline. PMH: HTN, chronic diastolic CHF, B2EF, autism, HLD, and CKD Stage IIIa. BSE 07/30/19: regular texture diet and thin liqiuds recommended. SLP eval in 2019 speech and language WFL with some hesitancy noted during communication.      SLP Plan  Continue with current plan of care      Recommendations for follow up therapy are one component of a multi-disciplinary discharge planning process, led by the attending physician.   Recommendations may be updated based on patient status, additional functional criteria and insurance authorization.    Recommendations  Liquids provided via: Straw;Cup Medication Administration: Whole meds with puree Supervision: Staff to assist with self feeding Compensations: Slow rate;Small sips/bites Postural Changes and/or Swallow Maneuvers: Seated upright 90 degrees;Upright 30-60 min after meal                Oral Care Recommendations: Oral care BID Follow Up Recommendations: Follow physician's recommendations for discharge plan and follow up therapies Assistance recommended at discharge: Frequent or constant Supervision/Assistance SLP Visit Diagnosis: Dysphagia, unspecified (R13.10);Cognitive communication deficit (R41.841) Plan: Continue with current plan of care           Celedonio Savage, [place], [place] Office: [phone]    04/26/2021, 11:50 AM

## 2021-04-26 NOTE — Progress Notes (Signed)
Nutrition Follow-up  DOCUMENTATION CODES:   Severe malnutrition in context of chronic illness  INTERVENTION:  -continue Ensure Enlive po BID, each supplement provides 350 kcal and 20 grams of protein -continue MVI with minerals daily  Recommend initiation of bowel regimen as no BM documented since PTA  NUTRITION DIAGNOSIS:   Severe Malnutrition related to chronic illness (CHF, h/o CVA) as evidenced by severe muscle depletion, severe fat depletion.  ongoing  GOAL:   Patient will meet greater than or equal to 90% of their needs  progressing  MONITOR:   PO intake, Supplement acceptance, Labs, Weight trends, I & O's  REASON FOR ASSESSMENT:   Malnutrition Screening Tool    ASSESSMENT:   Pt w/ PMH significant for anemia, anxiety, CHF, stage IIIa CKD, type 2 DM, diabetic neuropathy, thyroid nodule, osteopenia, and h/o nonhemorrhagic CVA admitted with acute posterior R MCA territory infarct.  Pt continues to be a poor historian, but denies any N/V/pain at this time. Appetite has been fair with meal completions documented as 0-100% completion x 5 recorded meals (60% avg meal intake). Pt doing well with supplements per RN. Recommend continue current nutrition plan of care.   UOP: 178ml x24 hours I/O: +3066ml since admit  Current weight: 57.7 kg Admit weight: 59.8 kg   No BM documented since PTA -- recommend initiation of bowel regimen if accurate  Medications: SSI Q4H, mvi with minerals, protonix Labs: Recent Labs  Lab 04/23/21 0225 04/24/21 0117 04/25/21 0036 04/26/21 0313  NA 134* 137 136 136  K 3.6 3.7 4.2 3.9  CL 106 108 107 105  CO2 18* 20* 21* 21*  BUN 21 24* 27* 24*  CREATININE 1.30* 1.32* 1.30* 1.25*  CALCIUM 8.1* 8.2* 8.3* 8.5*  MG 1.3*  --  1.5* 1.4*  GLUCOSE 121* 126* 133* 134*  CBGs: 116-113-130-118  Diet Order:   Diet Order             Diet regular Room service appropriate? Yes with Assist; Fluid consistency: Thin  Diet effective now                    EDUCATION NEEDS:   No education needs have been identified at this time  Skin:  Skin Assessment: Reviewed RN Assessment  Last BM:  PTA?  Height:   Ht Readings from Last 1 Encounters:  04/25/21 5\' 5"  (1.651 m)    Weight:   Wt Readings from Last 1 Encounters:  04/26/21 57.7 kg    BMI:  Body mass index is 21.17 kg/m.  Estimated Nutritional Needs:   Kcal:  1500-1700  Protein:  70-80 grams  Fluid:  >1.5L/d     Theone Stanley., MS, RD, LDN (she/her/hers) RD pager number and weekend/on-call pager number located in [place].

## 2021-04-26 NOTE — Progress Notes (Signed)
ANTICOAGULATION CONSULT NOTE - Follow Up Consult  Pharmacy Consult for Apixaban Indication: DVT and stroke  Allergies  Allergen Reactions   Ciprofloxacin Rash and Other (See Comments)    Localized red rash to IV site; "Allergic," per MAR   Penicillins Rash and Other (See Comments)    Tolerates multiple cephalosporins, including cephalexin; "Allergic to Penicillin," per [facility]    Patient Measurements: Height: 5\' 5"  (165.1 cm) Weight: 57.7 kg (127 lb 3.3 oz) IBW/kg (Calculated) : 57  Vital Signs: Temp: 98.2 F (36.8 C) (01/30 0808) Temp Source: Oral (01/30 0808) BP: 155/79 (01/30 0808) Pulse Rate: 73 (01/30 0808)  Labs: Recent Labs    04/24/21 0117 04/24/21 1644 04/25/21 0036 04/26/21 0313  HGB 9.0*  --  9.6* 9.2*  HCT 27.3*  --  30.4* 29.4*  PLT 343  --  360 357  HEPARINUNFRC 0.28* 0.29* 0.32 0.32  CREATININE 1.32*  --  1.30* 1.25*    Estimated Creatinine Clearance: 28 mL/min (A) (by C-G formula based on SCr of 1.25 mg/dL (H)).   Medications:  Scheduled:   atorvastatin  40 mg Oral Daily   feeding supplement  237 mL Oral BID BM   hydrALAZINE  50 mg Oral TID   insulin aspart  0-9 Units Subcutaneous Q4H   metoprolol tartrate  12.5 mg Oral BID   multivitamin with minerals  1 tablet Oral Daily   oxybutynin  5 mg Oral BID   pantoprazole (PROTONIX) IV  40 mg Intravenous Q24H   verapamil  120 mg Oral QHS   Infusions:   heparin 900 Units/hr (04/25/21 2352)   lactated ringers     magnesium sulfate bolus IVPB      Assessment: 88 yo F presented following syncopal event, found to have acute CVA, DVT, and suspicion of PE.  Pt has been on IV heparin since 1/26 with plans to switch to oral anticoagulation today, 1/30.    Multiple discussions regarding risks/benefits of anticoagulation have taken place between [facility], Dr. Erlinda Hong with Neuro, and the family.  A decision was made to start low dose apixaban for treatment of VTE in the setting of acute CVA.  Goal of Therapy:   Therapeutic Anticoagulation Monitor platelets by anticoagulation protocol: Yes   Plan:  Stop heparin and associated labs Apixaban 2.5mg  PO BID Monitor for s/sx bleeding  [employer], Pharm.D., BCPS Clinical Pharmacist Clinical phone for 04/26/2021 from 7:30-3:00 is [phone].  **Pharmacist phone directory can be found on [email or web address] listed under [place].  04/26/2021 8:58 AM

## 2021-04-27 ENCOUNTER — Ambulatory Visit: Payer: Medicare Other | Admitting: Podiatry

## 2021-04-27 DIAGNOSIS — E860 Dehydration: Secondary | ICD-10-CM | POA: Diagnosis not present

## 2021-04-27 DIAGNOSIS — G9341 Metabolic encephalopathy: Secondary | ICD-10-CM | POA: Diagnosis not present

## 2021-04-27 DIAGNOSIS — R410 Disorientation, unspecified: Secondary | ICD-10-CM | POA: Diagnosis not present

## 2021-04-27 DIAGNOSIS — I5032 Chronic diastolic (congestive) heart failure: Secondary | ICD-10-CM | POA: Diagnosis not present

## 2021-04-27 DIAGNOSIS — F419 Anxiety disorder, unspecified: Secondary | ICD-10-CM | POA: Diagnosis not present

## 2021-04-27 DIAGNOSIS — F0390 Unspecified dementia without behavioral disturbance: Secondary | ICD-10-CM | POA: Diagnosis not present

## 2021-04-27 DIAGNOSIS — R630 Anorexia: Secondary | ICD-10-CM | POA: Diagnosis not present

## 2021-04-27 DIAGNOSIS — R55 Syncope and collapse: Secondary | ICD-10-CM | POA: Diagnosis not present

## 2021-04-27 DIAGNOSIS — I63411 Cerebral infarction due to embolism of right middle cerebral artery: Secondary | ICD-10-CM | POA: Diagnosis not present

## 2021-04-27 DIAGNOSIS — E119 Type 2 diabetes mellitus without complications: Secondary | ICD-10-CM | POA: Diagnosis not present

## 2021-04-27 DIAGNOSIS — F840 Autistic disorder: Secondary | ICD-10-CM | POA: Diagnosis not present

## 2021-04-27 DIAGNOSIS — E1140 Type 2 diabetes mellitus with diabetic neuropathy, unspecified: Secondary | ICD-10-CM | POA: Diagnosis not present

## 2021-04-27 DIAGNOSIS — J810 Acute pulmonary edema: Secondary | ICD-10-CM | POA: Diagnosis not present

## 2021-04-27 DIAGNOSIS — E785 Hyperlipidemia, unspecified: Secondary | ICD-10-CM | POA: Diagnosis not present

## 2021-04-27 DIAGNOSIS — I10 Essential (primary) hypertension: Secondary | ICD-10-CM | POA: Diagnosis not present

## 2021-04-27 DIAGNOSIS — I69828 Other speech and language deficits following other cerebrovascular disease: Secondary | ICD-10-CM | POA: Diagnosis not present

## 2021-04-27 DIAGNOSIS — R5381 Other malaise: Secondary | ICD-10-CM | POA: Diagnosis not present

## 2021-04-27 DIAGNOSIS — Z7401 Bed confinement status: Secondary | ICD-10-CM | POA: Diagnosis not present

## 2021-04-27 DIAGNOSIS — I69811 Memory deficit following other cerebrovascular disease: Secondary | ICD-10-CM | POA: Diagnosis not present

## 2021-04-27 DIAGNOSIS — G936 Cerebral edema: Secondary | ICD-10-CM | POA: Diagnosis not present

## 2021-04-27 DIAGNOSIS — Z794 Long term (current) use of insulin: Secondary | ICD-10-CM | POA: Diagnosis not present

## 2021-04-27 DIAGNOSIS — I69322 Dysarthria following cerebral infarction: Secondary | ICD-10-CM | POA: Diagnosis not present

## 2021-04-27 DIAGNOSIS — D649 Anemia, unspecified: Secondary | ICD-10-CM | POA: Diagnosis not present

## 2021-04-27 DIAGNOSIS — M6281 Muscle weakness (generalized): Secondary | ICD-10-CM | POA: Diagnosis not present

## 2021-04-27 DIAGNOSIS — I82403 Acute embolism and thrombosis of unspecified deep veins of lower extremity, bilateral: Secondary | ICD-10-CM | POA: Diagnosis not present

## 2021-04-27 DIAGNOSIS — E43 Unspecified severe protein-calorie malnutrition: Secondary | ICD-10-CM | POA: Diagnosis not present

## 2021-04-27 DIAGNOSIS — R4182 Altered mental status, unspecified: Secondary | ICD-10-CM | POA: Diagnosis not present

## 2021-04-27 DIAGNOSIS — R5383 Other fatigue: Secondary | ICD-10-CM | POA: Diagnosis not present

## 2021-04-27 DIAGNOSIS — S92301D Fracture of unspecified metatarsal bone(s), right foot, subsequent encounter for fracture with routine healing: Secondary | ICD-10-CM | POA: Diagnosis not present

## 2021-04-27 DIAGNOSIS — F32A Depression, unspecified: Secondary | ICD-10-CM | POA: Diagnosis not present

## 2021-04-27 DIAGNOSIS — K219 Gastro-esophageal reflux disease without esophagitis: Secondary | ICD-10-CM | POA: Diagnosis not present

## 2021-04-27 DIAGNOSIS — R1312 Dysphagia, oropharyngeal phase: Secondary | ICD-10-CM | POA: Diagnosis not present

## 2021-04-27 DIAGNOSIS — I63311 Cerebral infarction due to thrombosis of right middle cerebral artery: Secondary | ICD-10-CM | POA: Diagnosis not present

## 2021-04-27 DIAGNOSIS — I160 Hypertensive urgency: Secondary | ICD-10-CM | POA: Diagnosis not present

## 2021-04-27 DIAGNOSIS — I639 Cerebral infarction, unspecified: Secondary | ICD-10-CM | POA: Diagnosis not present

## 2021-04-27 DIAGNOSIS — R627 Adult failure to thrive: Secondary | ICD-10-CM | POA: Diagnosis not present

## 2021-04-27 DIAGNOSIS — Z1159 Encounter for screening for other viral diseases: Secondary | ICD-10-CM | POA: Diagnosis not present

## 2021-04-27 DIAGNOSIS — E559 Vitamin D deficiency, unspecified: Secondary | ICD-10-CM | POA: Diagnosis not present

## 2021-04-27 DIAGNOSIS — R198 Other specified symptoms and signs involving the digestive system and abdomen: Secondary | ICD-10-CM | POA: Diagnosis not present

## 2021-04-27 DIAGNOSIS — D509 Iron deficiency anemia, unspecified: Secondary | ICD-10-CM | POA: Diagnosis not present

## 2021-04-27 DIAGNOSIS — Z515 Encounter for palliative care: Secondary | ICD-10-CM | POA: Diagnosis not present

## 2021-04-27 DIAGNOSIS — R0602 Shortness of breath: Secondary | ICD-10-CM | POA: Diagnosis not present

## 2021-04-27 DIAGNOSIS — R531 Weakness: Secondary | ICD-10-CM | POA: Diagnosis not present

## 2021-04-27 DIAGNOSIS — J90 Pleural effusion, not elsewhere classified: Secondary | ICD-10-CM | POA: Diagnosis not present

## 2021-04-27 DIAGNOSIS — R031 Nonspecific low blood-pressure reading: Secondary | ICD-10-CM | POA: Diagnosis not present

## 2021-04-27 DIAGNOSIS — Z7901 Long term (current) use of anticoagulants: Secondary | ICD-10-CM | POA: Diagnosis not present

## 2021-04-27 LAB — COMPREHENSIVE METABOLIC PANEL
ALT: 16 U/L (ref 0–44)
AST: 18 U/L (ref 15–41)
Albumin: 2.4 g/dL — ABNORMAL LOW (ref 3.5–5.0)
Alkaline Phosphatase: 94 U/L (ref 38–126)
Anion gap: 11 (ref 5–15)
BUN: 21 mg/dL (ref 8–23)
CO2: 20 mmol/L — ABNORMAL LOW (ref 22–32)
Calcium: 8.5 mg/dL — ABNORMAL LOW (ref 8.9–10.3)
Chloride: 105 mmol/L (ref 98–111)
Creatinine, Ser: 1.24 mg/dL — ABNORMAL HIGH (ref 0.44–1.00)
GFR, Estimated: 42 mL/min — ABNORMAL LOW (ref >60)
Glucose, Bld: 110 mg/dL — ABNORMAL HIGH (ref 70–99)
Potassium: 4.2 mmol/L (ref 3.5–5.1)
Sodium: 136 mmol/L (ref 135–145)
Total Bilirubin: 0.6 mg/dL (ref 0.3–1.2)
Total Protein: 5.2 g/dL — ABNORMAL LOW (ref 6.5–8.1)

## 2021-04-27 LAB — GLUCOSE, CAPILLARY
Glucose-Capillary: 121 mg/dL — ABNORMAL HIGH (ref 70–99)
Glucose-Capillary: 114 mg/dL — ABNORMAL HIGH (ref 70–99)

## 2021-04-27 LAB — CBC WITH DIFFERENTIAL/PLATELET
Abs Immature Granulocytes: 0.22 10*3/uL — ABNORMAL HIGH (ref 0.00–0.07)
Basophils Absolute: 0.1 10*3/uL (ref 0.0–0.1)
Basophils Relative: 1 %
Eosinophils Absolute: 0.3 10*3/uL (ref 0.0–0.5)
Eosinophils Relative: 3 %
HCT: 29.8 % — ABNORMAL LOW (ref 36.0–46.0)
Hemoglobin: 9.2 g/dL — ABNORMAL LOW (ref 12.0–15.0)
Immature Granulocytes: 2 %
Lymphocytes Relative: 15 %
Lymphs Abs: 1.4 10*3/uL (ref 0.7–4.0)
MCH: 27.1 pg (ref 26.0–34.0)
MCHC: 30.9 g/dL (ref 30.0–36.0)
MCV: 87.6 fL (ref 80.0–100.0)
Monocytes Absolute: 0.8 10*3/uL (ref 0.1–1.0)
Monocytes Relative: 8 %
Neutro Abs: 6.5 10*3/uL (ref 1.7–7.7)
Neutrophils Relative %: 71 %
Platelets: 338 10*3/uL (ref 150–400)
RBC: 3.4 MIL/uL — ABNORMAL LOW (ref 3.87–5.11)
RDW: 16.3 % — ABNORMAL HIGH (ref 11.5–15.5)
WBC: 9.1 10*3/uL (ref 4.0–10.5)
nRBC: 0 % (ref 0.0–0.2)

## 2021-04-27 LAB — MAGNESIUM: Magnesium: 2.4 mg/dL (ref 1.7–2.4)

## 2021-04-27 LAB — BRAIN NATRIURETIC PEPTIDE: B Natriuretic Peptide: 349.8 pg/mL — ABNORMAL HIGH (ref 0.0–100.0)

## 2021-04-27 MED ORDER — ATORVASTATIN CALCIUM 40 MG PO TABS: 40.0000 mg | ORAL_TABLET | Freq: Every day | ORAL | Status: AC

## 2021-04-27 MED ORDER — APIXABAN 2.5 MG PO TABS: 2.5000 mg | ORAL_TABLET | Freq: Two times a day (BID) | ORAL | Status: AC

## 2021-04-27 MED ORDER — PANTOPRAZOLE SODIUM 40 MG PO TBEC
40.0000 mg | DELAYED_RELEASE_TABLET | Freq: Every day | ORAL | Status: DC
  Administered 2021-04-27: 40 mg via ORAL
  Filled 2021-04-27: qty 1

## 2021-04-27 MED ORDER — INSULIN ASPART 100 UNIT/ML IJ SOLN: INTRAMUSCULAR | 0 refills | Status: AC

## 2021-04-27 NOTE — Discharge Summary (Signed)
Lisa Villegas FVC:[id number] DOB: July 25, 1930 DOA: 04/19/2021  PCP: [name], MD  Admit date: 04/19/2021  Discharge date: 04/27/2021  Admitted From: SNF   Disposition:  SNF   Recommendations for Outpatient Follow-up:   Follow up with PCP in 1-2 weeks  PCP Please obtain BMP/CBC, 2 view CXR in 1week,  (see Discharge instructions)   PCP Please follow up on the following pending results:    Home Health: None   Equipment/Devices: None  Consultations: Neuro Discharge Condition: Stable    CODE STATUS: DNR  Diet Recommendation: Heart Healthy   Diet Order             Diet - low sodium heart healthy           Diet regular Room service appropriate? Yes with Assist; Fluid consistency: Thin  Diet effective now                    No chief complaint on file.    Brief history of present illness from the day of admission and additional interim summary    88 year old female with history of autism, prior CVA, HFpEF, CKD stage IIIa, DM-2, HTN, HLD-who presented with syncope/confusion-found to have acute MCA territory infarct and bilateral lower extremity DVT.  See below for further details.   Significant events: 1/3-1/12>> hospitalization for AKI/multiple closed fractures of metatarsal of right foot 1/23>>  admit to [facility] for syncope/acute CVA 1/24>>  Transferred to [facility]   Significant studies: 1/23>> CXR: Bilateral pleural effusion 1/23>> MRI brain: Moderate large acute posterior right MCA infarct.  Associated cytotoxic edema 1/24>> MRA head/neck: 70% stenosis left ICA, 60% proximal right ICA, moderate to severe stenosis right P2/P3, severe stenosis of distal left M1, proximal M2. 1/24>> A1c: 6.8 1/24>> LDL: 134 1/25>> Spot EEG: No seizures. 1/25>> carotid Doppler: Right ICA 40-59% stenosis, left  ICA 1-39% stenosis 1/25>> lower extremity Dopplers: DVT left posterior tibial/left peroneal vein, age-indeterminate DVT involving the right common femoral, right femoral, right proximal profunda, popliteal, and right posterior tibial vein. 1/25>> Echo: EF 63-84%, grade 1 diastolic dysfunction.  RV systolic function is moderately reduced. 1/29>> CT head.  Nonacute.   Microbiology data: 1/23>> blood culture: No growth  Hospital Course     Acute posterior right MCA territory infarct- (present on admission) Continues to have dysarthria-but motor exam appears to be consistent with her stroke with weakness left significantly more than right.  Suspicion for PFO-and possible embolic stroke given that she has significant DVT in her lower extremities.  It is also possible that this could be thromboembolic stroke from large vessel disease.  See work-up as above.  Continue supportive care we will transition from heparin to Eliquis on 04/26/2021 if tolerates well then discharged to SNF.  Repeat CT scan done on 04/25/2021 is nonacute   Given advanced age/frailty-not a candidate for aggressive care including invasive vascular procedures.  Continue medical treatment.    Her LDL was above goal and she has been placed on statin for dyslipidemia, A1c was reasonable for her age, will place her on sliding scale insulin while she is here appears to be borderline DM type II with A1c of 6.8.       Bilateral lower extremity DVT. Likely provoked due to sedentary lifestyle/immobility due to recent fracture-given the fact that she syncopized-and has mild RV systolic dysfunction-likely has a PE as well.  However she is hemodynamically stable-not hypoxic-further work-up will not change management-as she is already on IV heparin-hence will not pursue CTA chest.  Will probably require indefinite anticoagulation.  This MD discussed with POA-[name]  1/26-she was understanding and agreeable to this strategy.   Protein-calorie malnutrition, severe- (present on admission) Continue supplements.   Bilateral pleural effusion- (present on admission) Probably related to HFpEF-or from pulmonary embolism.  Given the fact that she is asymptomatic/frail/on anticoagulation-would avoid thoracocentesis is much as possible-as this is highly likely that this is a transudate.  Will need to repeat chest x-rays in the outpatient setting in 1 to 2 weeks.     Acute renal failure superimposed on stage 3a chronic kidney disease ([place])- (present on admission) AKI likely hemodynamically mediated-improved with supportive care.  Continue to follow electrolytes periodically at SNF.   Multiple closed fractures of metatarsal bone of right foot- (present on admission) Recent admission for the same.  Resume outpatient follow-up with orthopedics.  Continue right foot surgical boot.   Malnutrition of moderate degree- (present on admission) Received protein supplementations here.   SIRS (systemic inflammatory response syndrome) ([place])- (present on admission) Noninfectious etiology-no signs of infection.  Being monitored off antimicrobial therapy.  Follow cultures.     Lactic acidosis- (present on admission) Suspect due to hypoperfusion/dehydration-no indication of infection at this point.  Doubt any clinical significance at this point-has been adequately hydrated.   History of Hodgkin's disease Reported history.  Unable to verify.   FTT (failure to thrive) in adult- (present on admission) Prognosis is guarded in the setting of large stroke.  Appears frail at baseline-with numerous medical problems-do not think she is a good candidate for aggressive care-DNR in place.Marland Kitchen  Appreciate palliative care evaluation.  Spoke at length with POA Lisa Villegas on 1/26.   Syncope- (present on admission) Initially thought to be vasovagal or orthostatic etiology-now with extensive DVT-in  slightly decreased RV systolic function on echo-suspect was from from VTE/pulmonary embolism.  EEG without seizures.  Already on anticoagulation-further work-up including a CTA chest would not change management-as she is already on anticoagulation.   Hypertensive urgency- (present on admission) BP stable-continue Verapmail, Metoprolol, Hydaralazine   GERD (gastroesophageal reflux disease)- (present on admission) Continue PPI.   Acute metabolic encephalopathy- (present on admission) Probably due to AKI/CVA/possible VTE-she is back to  her baseline.   Autism- (present on admission) History of autistic spectrum disorder-suspect mental status back to baseline.   Hyperlipidemia- (present on admission) Continue high intensity statin.  Repeat lipid panel in 3 months.   Type II diabetes mellitus with renal manifestations (A1c 6.8 on 1/24)- (present on admission) CBG stable with SSI.   Discharge diagnosis     Principal Problem:   Acute posterior right MCA territory infarct Active Problems:   Type II diabetes mellitus with renal manifestations (A1c 6.8 on 1/24)   Hyperlipidemia   Autism   Acute metabolic encephalopathy   GERD (gastroesophageal reflux disease)   Hypertensive urgency   Syncope   FTT (failure to thrive) in adult   History of Hodgkin's disease   Lactic acidosis   SIRS (systemic inflammatory response syndrome) (HCC)   Malnutrition of moderate degree   Multiple closed fractures of metatarsal bone of right foot   Acute renal failure superimposed on stage 3a chronic kidney disease (HCC)   Bilateral pleural effusion   Cytotoxic cerebral edema (HCC)   Protein-calorie malnutrition, severe   Bilateral lower extremity DVT.    Discharge instructions    Discharge Instructions     Ambulatory referral to Neurology   Complete by: As directed    Follow up with stroke clinic NP (Jessica Vanschaick or Cecille Rubin, if both not available, consider Zachery Dauer, or Ahern) at  [facility] in about 4 weeks. Thanks.   Diet - low sodium heart healthy   Complete by: As directed    Discharge instructions   Complete by: As directed    Follow with Primary MD [name], MD in 7 days   Get CBC, CMP, Magnesium, 2 view Chest X ray -  checked next visit within 1 week by Primary MD or SNF MD    Activity: As tolerated with Full fall precautions use walker/cane & assistance as needed  Disposition Home    Diet: Heart Healthy with feeding assistance and aspiration precautions.   Special Instructions: If you have smoked or chewed Tobacco  in the last 2 yrs please stop smoking, stop any regular Alcohol  and or any Recreational drug use.  On your next visit with your primary care physician please Get Medicines reviewed and adjusted.  Please request your Prim.MD to go over all Hospital Tests and Procedure/Radiological results at the follow up, please get all Hospital records sent to your Prim MD by signing hospital release before you go home.  If you experience worsening of your admission symptoms, develop shortness of breath, life threatening emergency, suicidal or homicidal thoughts you must seek medical attention immediately by calling 911 or calling your MD immediately  if symptoms less severe.  You Must read complete instructions/literature along with all the possible adverse reactions/side effects for all the Medicines you take and that have been prescribed to you. Take any new Medicines after you have completely understood and accpet all the possible adverse reactions/side effects.   Increase activity slowly   Complete by: As directed        Discharge Medications   Allergies as of 04/27/2021       Reactions   Ciprofloxacin Rash, Other (See Comments)   Localized red rash to IV site; "Allergic," per MAR   Penicillins Rash, Other (See Comments)   Tolerates multiple cephalosporins, including cephalexin; "Allergic to Penicillin," per [facility]        Medication List      STOP taking these medications    aspirin 81 MG tablet  Lantus SoloStar 100 UNIT/ML Solostar Pen Generic drug: insulin glargine   metFORMIN 1000 MG tablet Commonly known as: GLUCOPHAGE   metFORMIN 500 MG tablet Commonly known as: GLUCOPHAGE   oxyCODONE 5 MG immediate release tablet Commonly known as: Oxy IR/ROXICODONE       TAKE these medications    acetaminophen 325 MG tablet Commonly known as: TYLENOL Take 2 tablets (650 mg total) by mouth every 6 (six) hours as needed for mild pain (or Fever >/= 101).   apixaban 2.5 MG Tabs tablet Commonly known as: ELIQUIS Take 1 tablet (2.5 mg total) by mouth 2 (two) times daily.   atorvastatin 40 MG tablet Commonly known as: LIPITOR Take 1 tablet (40 mg total) by mouth daily.   blood glucose meter kit and supplies Dispense based on patient and insurance preference. Use up to four times daily as directed. (FOR ICD-10 E10.9, E11.9).   Ditropan XL 5 MG 24 hr tablet Generic drug: oxybutynin Take 5 mg by mouth 2 (two) times daily.   ferrous sulfate 325 (65 FE) MG tablet Take 325 mg by mouth daily with breakfast.   hydrALAZINE 50 MG tablet Commonly known as: APRESOLINE Take 50 mg by mouth 3 (three) times daily. What changed: Another medication with the same name was removed. Continue taking this medication, and follow the directions you see here.   insulin aspart 100 UNIT/ML injection Commonly known as: novoLOG Before each meal 3 times a day, 140-199 - 2 units, 200-250 - 4 units, 251-299 - 6 units,  300-349 - 8 units,  350 or above 10 units. Insulin PEN if approved, provide syringes and needles if needed.   INTEGRA PO Take 1 capsule by mouth in the morning.   metoprolol tartrate 25 MG tablet Commonly known as: LOPRESSOR Take 0.5 tablets (12.5 mg total) by mouth 2 (two) times daily.   omeprazole 40 MG capsule Commonly known as: PRILOSEC Take 40 mg by mouth daily.   PREVIDENT 5000 BOOSTER PLUS DT Place 1 application  onto teeth at bedtime.   senna-docusate 8.6-50 MG tablet Commonly known as: Senokot S Take 1 tablet by mouth 2 (two) times daily.   SENSODYNE COMPLETE PROTECTION MT Brush using Sensodyne every morning   verapamil 120 MG CR tablet Commonly known as: CALAN-SR Take 120 mg by mouth at bedtime.         Follow-up Information     [facility]. Schedule an appointment as soon as possible for a visit in 1 month(s).   Specialty: Neurology Why: stroke clinic Contact information: [address] [place] [phone]        [name], MD. Schedule an appointment as soon as possible for a visit in 1 week(s).   Specialty: Internal Medicine Contact information: [address] [place] [address] [phone]                 Major procedures and Radiology Reports - PLEASE review detailed and final reports thoroughly  -        CT HEAD WO CONTRAST (5MM)  Result Date: 04/25/2021 CLINICAL DATA:  Stroke follow-up EXAM: CT HEAD WITHOUT CONTRAST TECHNIQUE: Contiguous axial images were obtained from the base of the skull through the vertex without intravenous contrast. RADIATION DOSE REDUCTION: This exam was performed according to the departmental dose-optimization program which includes automated exposure control, adjustment of the mA and/or kV according to patient size and/or use of iterative reconstruction technique. COMPARISON:  Brain MRI from 6  days ago FINDINGS: Brain: Recent infarct centered in the right posterior temporal lobe. No acute hemorrhage or visible infarct progression. Chronic infarct at the left insula and frontal operculum. No hydrocephalus, collection, or masslike finding. Vascular: No hyperdense vessel or unexpected calcification. Skull: Normal. Negative for fracture or focal lesion. Sinuses/Orbits: No acute finding. IMPRESSION: 1. Recent right MCA branch infarct without hemorrhage or  visible progression 2. Remote left MCA branch infarct. Electronically Signed   By: Jorje Guild M.D.   On: 04/25/2021 10:35   CT Head Wo Contrast  Result Date: 04/19/2021 CLINICAL DATA:  Altered mental status EXAM: CT HEAD WITHOUT CONTRAST TECHNIQUE: Contiguous axial images were obtained from the base of the skull through the vertex without intravenous contrast. RADIATION DOSE REDUCTION: This exam was performed according to the departmental dose-optimization program which includes automated exposure control, adjustment of the mA and/or kV according to patient size and/or use of iterative reconstruction technique. 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 New no evidence of acute hemorrhage, hydrocephalus, extra-axial collection or mass lesion/mass effect. Vascular: No hyperdense vessel or unexpected calcification. Skull: Normal. Negative for fracture or focal lesion. Sinuses/Orbits: Air-fluid level seen in the bilateral sphenoid sinuses. Other: None. IMPRESSION: 1. New acute/subacute appearing right temporoparietal infarct. 2. No evidence of intracranial hemorrhage. Critical Value/emergent results were called by telephone at the time of interpretation on 04/19/2021 at 12:47 pm to provider DAN FLOYD , who verbally acknowledged these results. Electronically Signed   By: Yetta Glassman M.D.   On: 04/19/2021 12:51   MR ANGIO HEAD WO CONTRAST  Result Date: 04/20/2021 CLINICAL DATA:  Stroke/TIA, determine embolic source EXAM: MRA NECK WITHOUT CONTRAST MRA HEAD WITHOUT CONTRAST TECHNIQUE: Angiographic images of the Circle of Willis were acquired using MRA technique without intravenous contrast. COMPARISON:  MRA 07/26/2017 trauma correlation is also made with MRI head 04/19/2021 FINDINGS: MRA NECK FINDINGS Two-vessel arch with a common origin of the brachiocephalic and left common carotid arteries. Imaged portion shows no evidence of  aneurysm or dissection. No significant stenosis of the major arch vessel origins. Right common, internal, and external carotid arteries are patent, with approximately 60% narrowing of the right proximal ICA (series 4, image 54), likely secondary to calcified plaque. Left common carotid artery is patent. Poor evaluation of the bifurcation, likely secondary to the degree of calcified atherosclerosis. While this limits evaluation for stenosis, suspect approximately 70% luminal narrowing in the proximal left ICA. There is also likely severe stenosis at the origin of the left ECA. Extracranial vertebral arteries are patent, without hemodynamically significant stenosis. Possible mild narrowing of the proximal left V3 segment, although this is favored to be artifactual. MRA HEAD FINDINGS Both internal carotid arteries are patent to the termini, without stenosis or other abnormality. A1 segments patent. Normal anterior communicating artery. Anterior cerebral arteries are patent to their distal aspects. Mild-to-moderate narrowing of the right M1 (series 2, image 106), which has progressed since 2019. Moderate narrowing of the mid left M1 and severe narrowing in the distal left M1 or proximal M2 (series 2, image 111), both of which are new from the prior exam. Normal MCA bifurcations. Mild loss of signal in a proximal right M2 branch (series 2, image 109), favored to represent stenosis and poor flow, given normal appearance on the prior exam. Distal MCA branches perfused and symmetric. Vertebral arteries patent to the vertebrobasilar junction without stenosis. Basilar patent to its distal aspect, with mild irregularity. Superior  cerebellar arteries patent bilaterally. Bilateral P1 segments originate from the basilar artery. Focal loss of signal in the proximal right P2 segment (series 2, images 103-107), with additional moderate to severe stenosis in a right P3 (series 2, image 111), both of which are new from the prior exam.  A more inferior right P3 branch is not well visualized, which appears unchanged. Mild-to-moderate narrowing in the left proximal P2 (series 2, image 102), which has progressed slightly from the prior exam. PCAs perfused to their distal aspects without stenosis. There are likely diminutive bilateral posterior communicating arteries. IMPRESSION: 1. Evaluation of the left carotid bifurcation is limited, likely secondary to the degree of calcification, although approximately 70% luminal narrowing is suspected in the proximal left ICA. There is also likely severe stenosis of the origin of the left ECA. Consider CTA for further evaluation if clinically indicated. 2. Approximately 60% narrowing of the proximal right ICA. 3. Focal loss of signal in the proximal right P2 segment, with additional moderate to severe stenosis in the right P3 segment, both of which are new from the prior exam. 4. Poor opacification of a proximal right M2 branch, likely stenosis and poor flow, given normal appearance on the prior exam. 5. Severe stenosis in the distal left M1 or proximal left M2, with additional moderate narrowing of the bilateral M1 segments. Electronically Signed   By: Merilyn Baba M.D.   On: 04/20/2021 23:25   MR ANGIO NECK WO CONTRAST  Result Date: 04/20/2021 CLINICAL DATA:  Stroke/TIA, determine embolic source EXAM: MRA NECK WITHOUT CONTRAST MRA HEAD WITHOUT CONTRAST TECHNIQUE: Angiographic images of the Circle of Willis were acquired using MRA technique without intravenous contrast. COMPARISON:  MRA 07/26/2017 trauma correlation is also made with MRI head 04/19/2021 FINDINGS: MRA NECK FINDINGS Two-vessel arch with a common origin of the brachiocephalic and left common carotid arteries. Imaged portion shows no evidence of aneurysm or dissection. No significant stenosis of the major arch vessel origins. Right common, internal, and external carotid arteries are patent, with approximately 60% narrowing of the right  proximal ICA (series 4, image 54), likely secondary to calcified plaque. Left common carotid artery is patent. Poor evaluation of the bifurcation, likely secondary to the degree of calcified atherosclerosis. While this limits evaluation for stenosis, suspect approximately 70% luminal narrowing in the proximal left ICA. There is also likely severe stenosis at the origin of the left ECA. Extracranial vertebral arteries are patent, without hemodynamically significant stenosis. Possible mild narrowing of the proximal left V3 segment, although this is favored to be artifactual. MRA HEAD FINDINGS Both internal carotid arteries are patent to the termini, without stenosis or other abnormality. A1 segments patent. Normal anterior communicating artery. Anterior cerebral arteries are patent to their distal aspects. Mild-to-moderate narrowing of the right M1 (series 2, image 106), which has progressed since 2019. Moderate narrowing of the mid left M1 and severe narrowing in the distal left M1 or proximal M2 (series 2, image 111), both of which are new from the prior exam. Normal MCA bifurcations. Mild loss of signal in a proximal right M2 branch (series 2, image 109), favored to represent stenosis and poor flow, given normal appearance on the prior exam. Distal MCA branches perfused and symmetric. Vertebral arteries patent to the vertebrobasilar junction without stenosis. Basilar patent to its distal aspect, with mild irregularity. Superior cerebellar arteries patent bilaterally. Bilateral P1 segments originate from the basilar artery. Focal loss of signal in the proximal right P2 segment (series 2, images 103-107), with additional  moderate to severe stenosis in a right P3 (series 2, image 111), both of which are new from the prior exam. A more inferior right P3 branch is not well visualized, which appears unchanged. Mild-to-moderate narrowing in the left proximal P2 (series 2, image 102), which has progressed slightly from the  prior exam. PCAs perfused to their distal aspects without stenosis. There are likely diminutive bilateral posterior communicating arteries. IMPRESSION: 1. Evaluation of the left carotid bifurcation is limited, likely secondary to the degree of calcification, although approximately 70% luminal narrowing is suspected in the proximal left ICA. There is also likely severe stenosis of the origin of the left ECA. Consider CTA for further evaluation if clinically indicated. 2. Approximately 60% narrowing of the proximal right ICA. 3. Focal loss of signal in the proximal right P2 segment, with additional moderate to severe stenosis in the right P3 segment, both of which are new from the prior exam. 4. Poor opacification of a proximal right M2 branch, likely stenosis and poor flow, given normal appearance on the prior exam. 5. Severe stenosis in the distal left M1 or proximal left M2, with additional moderate narrowing of the bilateral M1 segments. Electronically Signed   By: Merilyn Baba M.D.   On: 04/20/2021 23:25   MR BRAIN WO CONTRAST  Result Date: 04/19/2021 CLINICAL DATA:  Neuro deficit, acute, stroke suspected. EXAM: MRI HEAD WITHOUT CONTRAST TECHNIQUE: Multiplanar, multiecho pulse sequences of the brain and surrounding structures were obtained without intravenous contrast. COMPARISON:  Head CT 04/19/2021 and MRI 07/26/2017 FINDINGS: Brain: There is a moderately large acute to early subacute posterior MCA infarct involving the temporal lobe, lateral aspect of the occipital lobe, and insula. A small acute infarct is also noted along the body of the right caudate nucleus. There is associated cytotoxic edema with sulcal effacement and slight mass effect on the temporal horn of the right lateral ventricle. No midline shift or extra-axial fluid collection is evident. A small focus of chronic hemorrhage/cavernoma in the left occipital lobe is unchanged from the prior MRI. There is a small chronic left MCA infarct  involving the frontal operculum and insula which is new from the prior MRI, as is a chronic lacunar infarct in the right thalamus. A small chronic right cerebellar infarct is unchanged. T2 hyperintensities elsewhere in the cerebral white matter bilaterally are stable to slightly increased from the prior MRI and are nonspecific but compatible with mild chronic small vessel ischemic disease. There is mild cerebral atrophy. Vascular: Major intracranial vascular flow voids are preserved. Skull and upper cervical spine: Unremarkable bone marrow signal. Sinuses/Orbits: Bilateral cataract extraction. Small volume fluid in the sphenoid sinuses. Trace left mastoid fluid. Other: None. IMPRESSION: 1. Moderately large acute to early subacute posterior right MCA infarct. 2. Small chronic infarcts as above. Electronically Signed   By: Logan Bores M.D.   On: 04/19/2021 17:13   DG CHEST PORT 1 VIEW  Result Date: 04/21/2021 CLINICAL DATA:  Bilateral pleural effusions. EXAM: PORTABLE CHEST 1 VIEW COMPARISON:  Chest x-ray 04/19/2021 FINDINGS: The heart is within normal limits in size and stable. Mild tortuosity and calcification of the thoracic aorta. Deviation of the trachea leftward due to right thyroid goiter and scoliosis. Persistent small bilateral pleural effusions, left larger than right with overlying bibasilar atelectasis. Mild central vascular congestion but no overt pulmonary edema. IMPRESSION: 1. Persistent small bilateral pleural effusions and overlying atelectasis. 2. Mild central vascular congestion without overt pulmonary edema. Electronically Signed   By: Ricky Stabs.D.  On: 04/21/2021 08:23   DG Chest Port 1 View  Result Date: 04/19/2021 CLINICAL DATA:  Syncopal episode.  Question sepsis. EXAM: PORTABLE CHEST 1 VIEW COMPARISON:  03/30/2021 FINDINGS: Worsened appearance since the study of 20 days ago. Borderline cardiomegaly and aortic atherosclerosis are again noted. There are developing bilateral  pleural effusions which are moderate in size and associated with bilateral lower lobe volume loss. Lower lobe pneumonia not excluded. Upper lungs remain clear. IMPRESSION: Development of moderate bilateral pleural effusions with lower lobe atelectasis. Pneumonia not excluded. Electronically Signed   By: Nelson Chimes M.D.   On: 04/19/2021 12:48   DG Chest Portable 1 View  Result Date: 03/30/2021 CLINICAL DATA:  Weakness EXAM: PORTABLE CHEST 1 VIEW COMPARISON:  12/04/2019 FINDINGS: The heart size and mediastinal contours are within normal limits. Both lungs are clear. No pleural effusion. IMPRESSION: No acute process in the chest. Electronically Signed   By: Macy Mis M.D.   On: 03/30/2021 10:20   DG Foot Complete Right  Result Date: 03/30/2021 CLINICAL DATA:  Swelling and bruising, patient has autism. EXAM: RIGHT FOOT COMPLETE - 3+ VIEW COMPARISON:  None. FINDINGS: Mildly displaced comminuted fracture of the proximal phalanx of the first digit. There are displaced fracture of the third, fourth and fifth metatarsal necks, likely a chronic process. 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 Soft tissue swelling about the dorsum of the foot. Prominent vascular calcifications. IMPRESSION: 1. Mildly displaced comminuted fracture of the proximal phalanx of the first digit. 2. Displaced fractures of the third, fourth and fifth metatarsal necks, which may be a chronic process. Correlate with prior imaging and clinical history. 3. 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 4. Soft tissue swelling about the dorsum of the foot. Electronically Signed   By: Keane Police D.O.   On: 03/30/2021 11:43   EEG adult  Result Date: 04/21/2021 Lora Havens, MD     04/21/2021  8:22 AM Patient Name: Lisa Villegas MRN: [id number] Epilepsy Attending: Lora Havens Referring Physician/Provider: Lavina Hamman, MD Date: 04/20/2021 Duration: 25.58 mins Patient history: 88 year old female  with syncope.  EEG to evaluate for seizures. Level of alertness: Awake AEDs during EEG study: None Technical aspects: This EEG study was done with scalp electrodes positioned according to the 10-20 International system of electrode placement. Electrical activity was acquired at a sampling rate of 500Hz and reviewed with a high frequency filter of 70Hz and a low frequency filter of 1Hz. EEG data were recorded continuously and digitally stored. Description: The posterior dominant rhythm consists of 8 Hz activity of moderate voltage (25-35 uV) seen predominantly in posterior head regions, symmetric and reactive to eye opening and eye closing.  EEG showed continuous 3 to 5 Hz theta -delta slowing in her right frontotemporal region.  Hyperventilation and photic stimulation were not performed.   ABNORMALITY -Continuous slow, right frontotemporal region IMPRESSION: This study is suggestive of cortical dysfunction in right frontotemporal region likely secondary to underlying stroke.  No seizures or epileptiform discharges were seen throughout the recording. Lora Havens   ECHOCARDIOGRAM COMPLETE  Result Date: 04/21/2021    ECHOCARDIOGRAM REPORT   Patient Name:   CABELA PACIFICO Date of Exam: 04/21/2021 Medical Rec #:  [id number]          Height:       65.0 in Accession #:    [id number]         Weight:       128.7 lb Date of  Birth:  1931/03/18          BSA:          1.640 m Patient Age:    6 years           BP:           178/80 mmHg Patient Gender: F                  HR:           93 bpm. Exam Location:  Inpatient Procedure: 2D Echo Indications:    Stroke  History:        Patient has prior history of Echocardiogram examinations, most                 recent 07/26/2017. CHF, Stroke; Risk Factors:Diabetes and                 Hypertension.  Sonographer:    Arlyss Gandy Referring Phys: [id number] [place]  1. Left ventricular ejection fraction, by estimation, is 60 to 65%. The left ventricle has normal  function. The left ventricle has no regional wall motion abnormalities. There is mild left ventricular hypertrophy. Left ventricular diastolic parameters are consistent with Grade I diastolic dysfunction (impaired relaxation).  2. Right ventricular systolic function is moderately reduced. The right ventricular size is mildly enlarged. There is moderately elevated pulmonary artery systolic pressure. The estimated right ventricular systolic pressure is 90.2 mmHg.  3. The mitral valve is degenerative. Trivial mitral valve regurgitation. No evidence of mitral stenosis. Moderate mitral annular calcification.  4. The aortic valve is tricuspid. Aortic valve regurgitation is mild. Aortic valve sclerosis/calcification is present, without any evidence of aortic stenosis. Aortic valve mean gradient measures 8.0 mmHg.  5. The inferior vena cava is dilated in size with <50% respiratory variability, suggesting right atrial pressure of 15 mmHg. FINDINGS  Left Ventricle: Left ventricular ejection fraction, by estimation, is 60 to 65%. The left ventricle has normal function. The left ventricle has no regional wall motion abnormalities. The left ventricular internal cavity size was normal in size. There is  mild left ventricular hypertrophy. Left ventricular diastolic parameters are consistent with Grade I diastolic dysfunction (impaired relaxation). Right Ventricle: The right ventricular size is mildly enlarged. No increase in right ventricular wall thickness. Right ventricular systolic function is moderately reduced. There is moderately elevated pulmonary artery systolic pressure. The tricuspid regurgitant velocity is 2.76 m/s, and with an assumed right atrial pressure of 15 mmHg, the estimated right ventricular systolic pressure is 40.9 mmHg. Left Atrium: Left atrial size was normal in size. Right Atrium: Right atrial size was normal in size. Pericardium: There is no evidence of pericardial effusion. Mitral Valve: The mitral valve  is degenerative in appearance. There is mild calcification of the mitral valve leaflet(s). Moderate mitral annular calcification. Trivial mitral valve regurgitation. No evidence of mitral valve stenosis. MV peak gradient, 7.0 mmHg. The mean mitral valve gradient is 3.0 mmHg. Tricuspid Valve: The tricuspid valve is normal in structure. Tricuspid valve regurgitation is trivial. Aortic Valve: The aortic valve is tricuspid. Aortic valve regurgitation is mild. Aortic regurgitation PHT measures 421 msec. Aortic valve sclerosis/calcification is present, without any evidence of aortic stenosis. Aortic valve mean gradient measures 8.0  mmHg. Aortic valve peak gradient measures 14.0 mmHg. Aortic valve area, by VTI measures 1.04 cm. Pulmonic Valve: The pulmonic valve was normal in structure. Pulmonic valve regurgitation is not visualized. Aorta: The aortic root is normal in size and structure.  Venous: The inferior vena cava is dilated in size with less than 50% respiratory variability, suggesting right atrial pressure of 15 mmHg. IAS/Shunts: No atrial level shunt detected by color flow Doppler.  LEFT VENTRICLE PLAX 2D LVIDd:         3.60 cm   Diastology LVIDs:         2.55 cm   LV e' medial:    4.35 cm/s LV PW:         1.00 cm   LV E/e' medial:  13.2 LV IVS:        1.00 cm   LV e' lateral:   3.92 cm/s LVOT diam:     1.60 cm   LV E/e' lateral: 14.6 LV SV:         30 LV SV Index:   18 LVOT Area:     2.01 cm  RIGHT VENTRICLE            IVC RV Basal diam:  4.20 cm    IVC diam: 2.20 cm RV Mid diam:    3.70 cm RV S prime:     8.81 cm/s TAPSE (M-mode): 1.3 cm LEFT ATRIUM           Index        RIGHT ATRIUM           Index LA diam:      2.70 cm 1.65 cm/m   RA Area:     17.20 cm LA Vol (A2C): 26.6 ml 16.22 ml/m  RA Volume:   49.40 ml  30.11 ml/m LA Vol (A4C): 44.4 ml 27.07 ml/m  AORTIC VALVE AV Area (Vmax):    1.03 cm AV Area (Vmean):   1.03 cm AV Area (VTI):     1.04 cm AV Vmax:           187.00 cm/s AV Vmean:           125.500 cm/s AV VTI:            0.287 m AV Peak Grad:      14.0 mmHg AV Mean Grad:      8.0 mmHg LVOT Vmax:         95.50 cm/s LVOT Vmean:        64.300 cm/s LVOT VTI:          0.149 m LVOT/AV VTI ratio: 0.52 AI PHT:            421 msec  AORTA Ao Root diam: 2.50 cm Ao Asc diam:  2.80 cm MITRAL VALVE                TRICUSPID VALVE MV Area (PHT): 4.36 cm     TR Peak grad:   30.5 mmHg MV Area VTI:   1.63 cm     TR Vmax:        276.00 cm/s MV Peak grad:  7.0 mmHg MV Mean grad:  3.0 mmHg     SHUNTS MV Vmax:       1.32 m/s     Systemic VTI:  0.15 m MV Vmean:      70.4 cm/s    Systemic Diam: 1.60 cm MV Decel Time: 174 msec MV E velocity: 57.40 cm/s MV A velocity: 133.00 cm/s MV E/A ratio:  0.43 Dalton McleanMD Electronically signed by Franki Monte Signature Date/Time: 04/21/2021/3:36:28 PM    Final    VAS US CAROTID  Result Date: 04/22/2021 Carotid Arterial Duplex Study Patient Name:  BREYAH AKHTER  Date of Exam:   04/21/2021 Medical Rec #: [id number]           Accession #:    [id number] Date of Birth: 08-Apr-1930           Patient Gender: F Patient Age:   74 years Exam Location:  [facility] Procedure:      VAS US CAROTID Referring Phys: Oren Binet --------------------------------------------------------------------------------  Indications:       CVA. Risk Factors:      Hypertension, hyperlipidemia, Diabetes. Limitations        Today's exam was limited due to the patient's inability or                    unwillingness to cooperate. Comparison Study:  07/27/2017 Carotid artery duplex- bilateral 1-39% ICA stenosis. Performing Technologist: Maudry Mayhew MHA, RDMS, RVT, RDCS  Examination Guidelines: A complete evaluation includes B-mode imaging, spectral Doppler, color Doppler, and power Doppler as needed of all accessible portions of each vessel. Bilateral testing is considered an integral part of a complete examination. Limited examinations for reoccurring indications may be performed as noted.   Right Carotid Findings: +----------+--------+--------+--------+---------------------+------------------+             PSV cm/s EDV cm/s Stenosis Plaque Description    Comments            +----------+--------+--------+--------+---------------------+------------------+  CCA Prox   58       9                 heterogenous,                                                                    irregular and                                                                    calcific                                  +----------+--------+--------+--------+---------------------+------------------+  CCA Distal 42       7                 heterogenous, smooth                                                             and calcific                              +----------+--------+--------+--------+---------------------+------------------+  ICA Prox   153      49       40-59%   heterogenous,  irregular and                                                                    calcific                                  +----------+--------+--------+--------+---------------------+------------------+  ICA Mid                                                     tortuous, unable                                                                 to Doppler          +----------+--------+--------+--------+---------------------+------------------+  ICA Distal 68       18                                      tortuous            +----------+--------+--------+--------+---------------------+------------------+  ECA        83       22                heterogenous and                                                                 calcific                                  +----------+--------+--------+--------+---------------------+------------------+ +----------+--------+-------+----------------+-------------------+             PSV cm/s EDV cms Describe         Arm Pressure (mmHG)   +----------+--------+-------+----------------+-------------------+  Subclavian 136              Multiphasic, WNL                      +----------+--------+-------+----------------+-------------------+ +---------+--------+--+--------+--+---------+  Vertebral PSV cm/s 89 EDV cm/s 24 Antegrade  +---------+--------+--+--------+--+---------+  Left Carotid Findings: +----------+--------+-------+--------+--------------------------------+--------+             PSV cm/s EDV     Stenosis Plaque Description               Comments                       cm/s                                                        +----------+--------+-------+--------+--------------------------------+--------+  CCA Prox   74       14               smooth and heterogenous                    +----------+--------+-------+--------+--------------------------------+--------+  CCA Distal 63       10               heterogenous and irregular                 +----------+--------+-------+--------+--------------------------------+--------+  ICA Prox   69       17      1-39%    smooth, heterogenous and                                                         calcific                                   +----------+--------+-------+--------+--------------------------------+--------+  ICA Distal 100      16                                                          +----------+--------+-------+--------+--------------------------------+--------+  ECA        94       9                                                           +----------+--------+-------+--------+--------------------------------+--------+ +----------+--------+--------+----------------+-------------------+             PSV cm/s EDV cm/s Describe         Arm Pressure (mmHG)  +----------+--------+--------+----------------+-------------------+  Subclavian 148               Multiphasic, WNL                      +----------+--------+--------+----------------+-------------------+  +---------+--------+--+--------+--+---------+  Vertebral PSV cm/s 62 EDV cm/s 14 Antegrade  +---------+--------+--+--------+--+---------+   Summary: Right Carotid: Velocities in the right ICA are consistent with a 40-59%                stenosis. Left Carotid: Velocities in the left ICA are consistent with a 1-39% stenosis. Vertebrals:  Bilateral vertebral arteries demonstrate antegrade flow. Subclavians: Normal flow hemodynamics were seen in bilateral subclavian              arteries. *See table(s) above for measurements and observations.  Electronically signed by Antony Contras MD on 04/22/2021 at 8:17:16 AM.    Final    VAS [place] LOWER EXTREMITY VENOUS (DVT)  Result Date: 04/21/2021  Lower Venous DVT Study Patient Name:  DAILAH OPPERMAN  Date of Exam:   04/21/2021 Medical Rec #: [id number]           Accession #:    [id number] Date of Birth: 04/01/1930  Patient Gender: F Patient Age:   50 years Exam Location:  [facility] Procedure:      VAS [place] LOWER EXTREMITY VENOUS (DVT) Referring Phys: Cornelius Moras XU --------------------------------------------------------------------------------  Indications: Stroke.  Comparison Study: No prior study Performing Technologist: Maudry Mayhew MHA, RDMS, RVT, RDCS  Examination Guidelines: A complete evaluation includes B-mode imaging, spectral Doppler, color Doppler, and power Doppler as needed of all accessible portions of each vessel. Bilateral testing is considered an integral part of a complete examination. Limited examinations for reoccurring indications may be performed as noted. The reflux portion of the exam is performed with the patient in reverse Trendelenburg.  +---------+---------------+---------+-----------+----------+-----------------+  RIGHT     Compressibility Phasicity Spontaneity Properties Thrombus Aging     +---------+---------------+---------+-----------+----------+-----------------+  CFV       None                      No                     Age  Indeterminate  +---------+---------------+---------+-----------+----------+-----------------+  FV Prox   None                                             Age Indeterminate  +---------+---------------+---------+-----------+----------+-----------------+  FV Mid    None                                             Age Indeterminate  +---------+---------------+---------+-----------+----------+-----------------+  FV Distal None                                             Age Indeterminate  +---------+---------------+---------+-----------+----------+-----------------+  PFV       None                                             Age Indeterminate  +---------+---------------+---------+-----------+----------+-----------------+  POP       None                      No                     Age Indeterminate  +---------+---------------+---------+-----------+----------+-----------------+  PTV       None                      No                     Age Indeterminate  +---------+---------------+---------+-----------+----------+-----------------+  PERO      None                      No                     Age Indeterminate  +---------+---------------+---------+-----------+----------+-----------------+  EIV                       No        Yes                                       +---------+---------------+---------+-----------+----------+-----------------+  Right Technical Findings: Not visualized segments include Right common iliac vein, IVC.  +---------+---------------+---------+-----------+----------+--------------+  LEFT      Compressibility Phasicity Spontaneity Properties Thrombus Aging  +---------+---------------+---------+-----------+----------+--------------+  CFV       Full            No        Yes                                    +---------+---------------+---------+-----------+----------+--------------+  SFJ       Full                                                              +---------+---------------+---------+-----------+----------+--------------+  FV Prox   Full                                                             +---------+---------------+---------+-----------+----------+--------------+  FV Mid    Full                                                             +---------+---------------+---------+-----------+----------+--------------+  FV Distal Full                                                             +---------+---------------+---------+-----------+----------+--------------+  PFV       Full                                                             +---------+---------------+---------+-----------+----------+--------------+  POP       Full            No        Yes                                    +---------+---------------+---------+-----------+----------+--------------+  PTV       None                                             Acute           +---------+---------------+---------+-----------+----------+--------------+  PERO      None  Acute           +---------+---------------+---------+-----------+----------+--------------+     Summary: RIGHT: - Findings consistent with age indeterminate deep vein thrombosis involving the right common femoral vein, right femoral vein, right proximal profunda vein, right popliteal vein, right posterior tibial veins, and right peroneal veins. - No cystic structure found in the popliteal fossa. - Right external iliac vein is patent with pulsatile flow, suggestive of possibly elevated right heart pressure.  LEFT: - Findings consistent with acute deep vein thrombosis involving the left posterior tibial veins, and left peroneal veins. - No cystic structure found in the popliteal fossa. - Pulsatile flow noted, suggestive of possibly elevated right heart pressure.  *See table(s) above for measurements and observations. Electronically signed by Harold Barban MD on 04/21/2021 at 9:15:37 PM.     Final      Today   Subjective    Katheren Shams today in bed, unreliable historian, denies any headache or chest pain.  No new weakness   Objective   Blood pressure (!) 163/71, pulse 70, temperature 98.1 F (36.7 C), temperature source Oral, resp. rate 20, height 5' 5" (1.651 m), weight 57.7 kg, SpO2 94 %.   Intake/Output Summary (Last 24 hours) at 04/27/2021 0910 Last data filed at 04/26/2021 1612 Gross per 24 hour  Intake 514.37 ml  Output 500 ml  Net 14.37 ml    Exam  Awake Alert x1, does not cooperate with exam, moves all 4 extremities to painful stimuli left side weaker as compared to right, [place].AT,PERRAL Supple Neck,   Symmetrical Chest wall movement, Good air movement bilaterally, CTAB RRR,No Gallops,   +ve B.Sounds, Abd Soft, Non tender,  No Cyanosis, wearing surgical boot on the right foot saved protein supplementation here continue at SNF per protocol.   Data Review   CBC w Diff:  Lab Results  Component Value Date   WBC 9.1 04/27/2021   HGB 9.2 (L) 04/27/2021   HCT 29.8 (L) 04/27/2021   PLT 338 04/27/2021   LYMPHOPCT 15 04/27/2021   MONOPCT 8 04/27/2021   EOSPCT 3 04/27/2021   BASOPCT 1 04/27/2021    CMP:  Lab Results  Component Value Date   NA 136 04/27/2021   K 4.2 04/27/2021   CL 105 04/27/2021   CO2 20 (L) 04/27/2021   BUN 21 04/27/2021   CREATININE 1.24 (H) 04/27/2021   CREATININE 0.79 11/29/2016   PROT 5.2 (L) 04/27/2021   ALBUMIN 2.4 (L) 04/27/2021   BILITOT 0.6 04/27/2021   ALKPHOS 94 04/27/2021   AST 18 04/27/2021   ALT 16 04/27/2021  . Lab Results  Component Value Date   CHOL 211 (H) 04/20/2021   HDL 53 04/20/2021   LDLCALC 134 (H) 04/20/2021   TRIG 119 04/20/2021   CHOLHDL 4.0 04/20/2021   Lab Results  Component Value Date   HGBA1C 6.8 (H) 04/20/2021     Total Time in preparing paper work, data evaluation and todays exam - 69 minutes  Lala Lund M.D on 04/27/2021 at 9:10 AM  [facility]

## 2021-04-27 NOTE — Plan of Care (Signed)
°  Problem: Education: Goal: Knowledge of General Education information will improve Description: Including pain rating scale, medication(s)/side effects and non-pharmacologic comfort measures Outcome: Adequate for Discharge   Problem: Health Behavior/Discharge Planning: Goal: Ability to manage health-related needs will improve Outcome: Adequate for Discharge   Problem: Clinical Measurements: Goal: Ability to maintain clinical measurements within normal limits will improve Outcome: Adequate for Discharge Goal: Will remain free from infection Outcome: Adequate for Discharge Goal: Diagnostic test results will improve Outcome: Adequate for Discharge Goal: Respiratory complications will improve Outcome: Adequate for Discharge Goal: Cardiovascular complication will be avoided Outcome: Adequate for Discharge   Problem: Activity: Goal: Risk for activity intolerance will decrease Outcome: Adequate for Discharge   Problem: Nutrition: Goal: Adequate nutrition will be maintained Outcome: Adequate for Discharge   Problem: Coping: Goal: Level of anxiety will decrease Outcome: Adequate for Discharge   Problem: Elimination: Goal: Will not experience complications related to bowel motility Outcome: Adequate for Discharge Goal: Will not experience complications related to urinary retention Outcome: Adequate for Discharge   Problem: Pain Managment: Goal: General experience of comfort will improve Outcome: Adequate for Discharge   Problem: Safety: Goal: Ability to remain free from injury will improve Outcome: Adequate for Discharge   Problem: Skin Integrity: Goal: Risk for impaired skin integrity will decrease Outcome: Adequate for Discharge   Problem: Education: Goal: Knowledge of disease or condition will improve Outcome: Adequate for Discharge Goal: Knowledge of secondary prevention will improve (SELECT ALL) Outcome: Adequate for Discharge Goal: Knowledge of patient specific  risk factors will improve (INDIVIDUALIZE FOR PATIENT) Outcome: Adequate for Discharge   Problem: Health Behavior/Discharge Planning: Goal: Ability to manage health-related needs will improve Outcome: Adequate for Discharge   Problem: Ischemic Stroke/TIA Tissue Perfusion: Goal: Complications of ischemic stroke/TIA will be minimized Outcome: Adequate for Discharge

## 2021-04-27 NOTE — Progress Notes (Signed)
Manufacturing engineer [facility])  Hospital Liaison: RN note         This patient has been referred to our palliative care services in the community at [facility].   [facility] will continue to follow for any discharge planning needs and to coordinate continuation of palliative care in the outpatient setting.    If you have questions or need assistance, please call [phone] or contact the hospital Liaison listed on AMION.      Thank you for this referral.         Farrel Gordon, RN, [place] [facility] Liaison   [phone]

## 2021-04-27 NOTE — Plan of Care (Signed)
°  Problem: Health Behavior/Discharge Planning: Goal: Ability to manage health-related needs will improve Outcome: Progressing   Problem: Activity: Goal: Risk for activity intolerance will decrease Outcome: Progressing   Problem: Nutrition: Goal: Adequate nutrition will be maintained Outcome: Progressing   Problem: Elimination: Goal: Will not experience complications related to bowel motility Outcome: Progressing   Problem: Safety: Goal: Ability to remain free from injury will improve Outcome: Progressing   Problem: Skin Integrity: Goal: Risk for impaired skin integrity will decrease Outcome: Progressing

## 2021-04-27 NOTE — TOC Transition Note (Signed)
Transition of Care [facility]) - CM/SW Discharge Note   Patient Details  Name: Lisa Villegas MRN: [id number] Date of Birth: [birth year]  Transition [facility]) CM/SW Contact:  Geralynn Ochs, LCSW Phone Number: 04/27/2021, 10:11 AM   Clinical Narrative:   Bed available at [facility], sent discharge instructions. Transport arranged with [facility]. Notified legal guardian, she was asking to speak with MD about discharge. Outpatient palliative referral provided to [facility].  Nurse to call report to [phone], Room 126.    Final next level of care: Skilled Nursing Facility Barriers to Discharge: Barriers Resolved   Patient Goals and CMS Choice Patient states their goals for this hospitalization and ongoing recovery are:: patient unable to participate in goal setting, not fully oriented CMS Medicare.gov Compare Post Acute Care list provided to:: Patient Represenative (must comment) Choice offered to / list presented to : [place] / [place]  Discharge Placement              Patient chooses bed at: [facility] Patient to be transferred to facility by: [facility] Name of family member notified: [name] Patient and family notified of of transfer: 04/27/21  Discharge Plan and Services     Post Acute Care Choice: [place]                               Social Determinants of Health (SDOH) Interventions     Readmission Risk Interventions Readmission Risk Prevention Plan 08/01/2019 04/02/2019  Transportation Screening Complete Complete  PCP or Specialist Appt within 3-5 Days Complete Complete  HRI or Home Care Consult Complete Complete  Social Work Consult for [place] Planning/Counseling Complete Complete  Palliative Care Screening Complete Not Applicable  Medication Review Press photographer) Complete Referral to Pharmacy  Some recent data might be hidden

## 2021-05-05 ENCOUNTER — Non-Acute Institutional Stay: Payer: Medicare Other | Admitting: Hospice

## 2021-05-05 ENCOUNTER — Other Ambulatory Visit: Payer: Self-pay

## 2021-05-05 DIAGNOSIS — F0390 Unspecified dementia without behavioral disturbance: Secondary | ICD-10-CM

## 2021-05-05 DIAGNOSIS — I69811 Memory deficit following other cerebrovascular disease: Secondary | ICD-10-CM | POA: Diagnosis not present

## 2021-05-05 DIAGNOSIS — R531 Weakness: Secondary | ICD-10-CM

## 2021-05-05 DIAGNOSIS — I639 Cerebral infarction, unspecified: Secondary | ICD-10-CM

## 2021-05-05 DIAGNOSIS — J90 Pleural effusion, not elsewhere classified: Secondary | ICD-10-CM | POA: Diagnosis not present

## 2021-05-05 DIAGNOSIS — R1312 Dysphagia, oropharyngeal phase: Secondary | ICD-10-CM | POA: Diagnosis not present

## 2021-05-05 DIAGNOSIS — Z515 Encounter for palliative care: Secondary | ICD-10-CM

## 2021-05-05 DIAGNOSIS — I5032 Chronic diastolic (congestive) heart failure: Secondary | ICD-10-CM | POA: Diagnosis not present

## 2021-05-05 DIAGNOSIS — J810 Acute pulmonary edema: Secondary | ICD-10-CM | POA: Diagnosis not present

## 2021-05-05 NOTE — Progress Notes (Signed)
[place] Consult Note Telephone: [phone]  Fax: [phone]  PATIENT NAME: Lisa Villegas DOB: 23-Nov-1930 MRN: [id number]   PRIMARY CARE PROVIDER:   [name], MD   REFERRING PROVIDER:  [name], [address] [place],  [place] [address]   RESPONSIBLE PARTY:    [name] [phone]        RECOMMENDATIONS/PLAN:  Advance Care Planning/Goals of Care: Visit is to build trust and follow-up on file palliative care.  Palliative care team will continue to support patient, patient's family, and medical team.   Goals of care: Goals include to maximize quality of life and symptom management.  CODE STATUS: Patient remains a DNR.  Symptom Management:  Weakness: Currently due to recent CVA for which patient was hospitalized 04/19/2021 - 04/27/2021.  Non ambu, max ADLs, max bed mobility. PT/OT is ongoing for strengthening, gait training, environmental adaptation. Dysphagia: ST consults is ongoing.  Choking precautions.  Continue diabetic diet, regular texture, thin liquid consistency. Dementia: Impoverished thoughts, memory loss/confusion in line with dementia disease trajectory.  Incontinent of bowel and bladder, fast 6D.  Continue ongoing supportive care Follow up Palliative Care Visit: Palliative care will continue to follow for goals of care clarification and symptom management.   Chief complaint: Follow-up visit  HISTORY OF PRESENT ILLNESS:  Lisa Villegas is a 88 y.o. year old female with multiple medical problems including recent CVA, Dysphagia, dementia, gait disturbance, Autism, HTN, HLD. Palliative Care was asked to help address goals of care.  Patient denied pain/discomfort, in no acute distress; cooperative with OT/PT.History obtained from review of EMR, discussion with primary team, caregiver, family and/or [name]. Review and summarization of Epic records shows history from  other than patient. Rest of 10 point ROS asked and negative.    CODE STATUS: DNR   PPS: weak 40% HOSPICE ELIGIBILITY/DIAGNOSIS: TBD Physical Exam: Constitutional: NAD General: Well groomed, cooperative EYES: anicteric sclera, lids intact, no discharge  ENMT: Moist mucous membrane CV: S1 S2, RRR, no LE edema Pulmonary: LCTA, no increased work of breathing, no cough, Abdomen: active BS + 4 quadrants, soft and non tender GU: no suprapubic tenderness MSK: weakness, nonambulatory Skin: warm and dry, no rashes or wounds on visible skin Neuro:  weakness, otherwise non focal Psych: non-anxious affect Hem/lymph/immuno: no widespread bruising  PAST MEDICAL HISTORY:  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[place])    Autism    Chronic CHF (congestive heart failure) ([place])    per med rec   Chronic kidney disease (CKD)    stage 3 per chart in 09/2015   Diabetes mellitus without complication (HCC)    Diabetic neuropathy ([place])    History of thyroid nodule    nontoxic goiter per med rec   Hodgkin disease ([place])    in 1980   HTN (hypertension), benign    Hyperlipidemia    Incontinence    Lack of sensation    Osteopenia    per med rec   Poor balance    Stroke ([place])    Tubular adenoma of colon 01/2017   Vitamin D deficiency     SOCIAL HX:  Social History   Tobacco Use   Smoking status: Never   Smokeless tobacco: Never  Substance Use Topics   Alcohol use: No    ALLERGIES:  Allergies  Allergen Reactions   Ciprofloxacin  Rash and Other (See Comments)    Localized red rash to IV site; "Allergic," per MAR   Penicillins Rash and Other (See Comments)    Tolerates multiple cephalosporins, including cephalexin; "Allergic to Penicillin," per [facility]     PERTINENT MEDICATIONS:  Outpatient Encounter Medications as of 05/05/2021  Medication Sig   acetaminophen (TYLENOL) 325 MG tablet Take 2 tablets (650 mg total) by mouth  every 6 (six) hours as needed for mild pain (or Fever >/= 101).   apixaban (ELIQUIS) 2.5 MG TABS tablet Take 1 tablet (2.5 mg total) by mouth 2 (two) times daily.   atorvastatin (LIPITOR) 40 MG tablet Take 1 tablet (40 mg total) by mouth daily.   blood glucose meter kit and supplies Dispense based on patient and insurance preference. Use up to four times daily as directed. (FOR ICD-10 E10.9, E11.9).   DITROPAN XL 5 MG 24 hr tablet Take 5 mg by mouth 2 (two) times daily.   Fe Fum-FePoly-Vit C-Vit B3 (INTEGRA PO) Take 1 capsule by mouth in the morning.   ferrous sulfate 325 (65 FE) MG tablet Take 325 mg by mouth daily with breakfast.   hydrALAZINE (APRESOLINE) 50 MG tablet Take 50 mg by mouth 3 (three) times daily.   insulin aspart (NOVOLOG) 100 UNIT/ML injection Before each meal 3 times a day, 140-199 - 2 units, 200-250 - 4 units, 251-299 - 6 units,  300-349 - 8 units,  350 or above 10 units. Insulin PEN if approved, provide syringes and needles if needed.   metoprolol tartrate (LOPRESSOR) 25 MG tablet Take 0.5 tablets (12.5 mg total) by mouth 2 (two) times daily.   omeprazole (PRILOSEC) 40 MG capsule Take 40 mg by mouth daily.   senna-docusate (SENOKOT S) 8.6-50 MG tablet Take 1 tablet by mouth 2 (two) times daily.   Sodium Fluoride (PREVIDENT 5000 BOOSTER PLUS DT) Place 1 application onto teeth at bedtime.   Stannous Fluoride (SENSODYNE COMPLETE PROTECTION MT) Brush using Sensodyne every morning   verapamil (CALAN-SR) 120 MG CR tablet Take 120 mg by mouth at bedtime.   No facility-administered encounter medications on file as of 05/05/2021.   I spent 45 minutes providing this consultation; time includes spent with patient/family, chart review and documentation. More than 50% of the time in this consultation was spent on care coordination  Thank you for the opportunity to participate in the care of [name].  The palliative care team will continue to follow. Please call our office at  [phone] if we can be of additional assistance.   Note: Portions of this note were generated with Lobby[employer]. Dictation errors may occur despite best attempts at proofreading.   Teodoro Spray, NP

## 2021-05-09 ENCOUNTER — Emergency Department (HOSPITAL_COMMUNITY)
Admission: EM | Admit: 2021-05-09 | Discharge: 2021-05-09 | Disposition: A | Discharge status: Skilled Nursing Facility | Payer: Medicare Other | Attending: Emergency Medicine | Admitting: Emergency Medicine

## 2021-05-09 ENCOUNTER — Encounter (HOSPITAL_COMMUNITY): Payer: Self-pay

## 2021-05-09 ENCOUNTER — Other Ambulatory Visit: Payer: Self-pay

## 2021-05-09 DIAGNOSIS — R031 Nonspecific low blood-pressure reading: Secondary | ICD-10-CM | POA: Insufficient documentation

## 2021-05-09 DIAGNOSIS — R531 Weakness: Secondary | ICD-10-CM | POA: Diagnosis not present

## 2021-05-09 DIAGNOSIS — R410 Disorientation, unspecified: Secondary | ICD-10-CM | POA: Diagnosis not present

## 2021-05-09 DIAGNOSIS — F840 Autistic disorder: Secondary | ICD-10-CM | POA: Diagnosis not present

## 2021-05-09 DIAGNOSIS — E119 Type 2 diabetes mellitus without complications: Secondary | ICD-10-CM | POA: Diagnosis not present

## 2021-05-09 DIAGNOSIS — R0602 Shortness of breath: Secondary | ICD-10-CM | POA: Insufficient documentation

## 2021-05-09 DIAGNOSIS — Z794 Long term (current) use of insulin: Secondary | ICD-10-CM | POA: Diagnosis not present

## 2021-05-09 LAB — CBC WITH DIFFERENTIAL/PLATELET
Abs Immature Granulocytes: 0.13 10*3/uL — ABNORMAL HIGH (ref 0.00–0.07)
Basophils Absolute: 0.1 10*3/uL (ref 0.0–0.1)
Basophils Relative: 0 %
Eosinophils Absolute: 0.1 10*3/uL (ref 0.0–0.5)
Eosinophils Relative: 1 %
HCT: 33.4 % — ABNORMAL LOW (ref 36.0–46.0)
Hemoglobin: 10.2 g/dL — ABNORMAL LOW (ref 12.0–15.0)
Immature Granulocytes: 1 %
Lymphocytes Relative: 9 %
Lymphs Abs: 1.1 10*3/uL (ref 0.7–4.0)
MCH: 26.4 pg (ref 26.0–34.0)
MCHC: 30.5 g/dL (ref 30.0–36.0)
MCV: 86.5 fL (ref 80.0–100.0)
Monocytes Absolute: 0.5 10*3/uL (ref 0.1–1.0)
Monocytes Relative: 4 %
Neutro Abs: 10.5 10*3/uL — ABNORMAL HIGH (ref 1.7–7.7)
Neutrophils Relative %: 85 %
Platelets: 422 10*3/uL — ABNORMAL HIGH (ref 150–400)
RBC: 3.86 MIL/uL — ABNORMAL LOW (ref 3.87–5.11)
RDW: 15.9 % — ABNORMAL HIGH (ref 11.5–15.5)
WBC: 12.3 10*3/uL — ABNORMAL HIGH (ref 4.0–10.5)
nRBC: 0 % (ref 0.0–0.2)

## 2021-05-09 LAB — COMPREHENSIVE METABOLIC PANEL
ALT: 15 U/L (ref 0–44)
AST: 19 U/L (ref 15–41)
Albumin: 3.1 g/dL — ABNORMAL LOW (ref 3.5–5.0)
Alkaline Phosphatase: 87 U/L (ref 38–126)
Anion gap: 9 (ref 5–15)
BUN: 34 mg/dL — ABNORMAL HIGH (ref 8–23)
CO2: 21 mmol/L — ABNORMAL LOW (ref 22–32)
Calcium: 9 mg/dL (ref 8.9–10.3)
Chloride: 107 mmol/L (ref 98–111)
Creatinine, Ser: 1.12 mg/dL — ABNORMAL HIGH (ref 0.44–1.00)
GFR, Estimated: 47 mL/min — ABNORMAL LOW (ref >60)
Glucose, Bld: 189 mg/dL — ABNORMAL HIGH (ref 70–99)
Potassium: 4.4 mmol/L (ref 3.5–5.1)
Sodium: 137 mmol/L (ref 135–145)
Total Bilirubin: <0.1 mg/dL — ABNORMAL LOW (ref 0.3–1.2)
Total Protein: 6.5 g/dL (ref 6.5–8.1)

## 2021-05-09 LAB — URINALYSIS, ROUTINE W REFLEX MICROSCOPIC
Bacteria, UA: NONE SEEN
Bilirubin Urine: NEGATIVE
Glucose, UA: 50 mg/dL — AB
Hgb urine dipstick: NEGATIVE
Ketones, ur: NEGATIVE mg/dL
Leukocytes,Ua: NEGATIVE
Nitrite: NEGATIVE
Protein, ur: 100 mg/dL — AB
Specific Gravity, Urine: 1.015 (ref 1.005–1.030)
pH: 5 (ref 5.0–8.0)

## 2021-05-09 LAB — BRAIN NATRIURETIC PEPTIDE: B Natriuretic Peptide: 196.9 pg/mL — ABNORMAL HIGH (ref 0.0–100.0)

## 2021-05-09 NOTE — ED Triage Notes (Signed)
Patient coming from [facility]. Patient was doing PT per nursing home staff and She had low BP and confused.

## 2021-05-09 NOTE — ED Notes (Signed)
Pt. Saline lock removed and gauze applied.

## 2021-05-09 NOTE — Discharge Instructions (Addendum)
Call your primary care doctor or specialist as discussed in the next 2-3 days.   Return immediately back to the ER if:  Your symptoms worsen within the next 12-24 hours. You develop new symptoms such as new fevers, persistent vomiting, new pain, shortness of breath, or new weakness or numbness, or if you have any other concerns.  

## 2021-05-09 NOTE — ED Provider Notes (Signed)
[facility] DEPT Provider Note   CSN: [id number] Arrival date & time: 05/09/21  1459     History  No chief complaint on file.   Lisa Villegas is a 88 y.o. female.  Patient presents with a chief complaint obtained from EMS.  She has a history of diabetes, autism, poor balance, presenting from nursing home with concern for low blood pressure today.  Reportedly blood pressure was in the 70s and 80s.  However when EMS arrived they stated the blood pressure was normal at about 627 systolic.  Patient herself does not appear to have any other complaints.  Difficult to obtain history from her given she is a nursing home patient with some mental delay.  No additional report provided for history of fevers or cough or vomiting or diarrhea.        Home Medications Prior to Admission medications   Medication Sig Start Date End Date Taking? Authorizing Provider  acetaminophen (TYLENOL) 325 MG tablet Take 2 tablets (650 mg total) by mouth every 6 (six) hours as needed for mild pain (or Fever >/= 101). 04/02/21   Eugenie Filler, MD  apixaban (ELIQUIS) 2.5 MG TABS tablet Take 1 tablet (2.5 mg total) by mouth 2 (two) times daily. 04/27/21   Thurnell Lose, MD  atorvastatin (LIPITOR) 40 MG tablet Take 1 tablet (40 mg total) by mouth daily. 04/27/21   Thurnell Lose, MD  blood glucose meter kit and supplies Dispense based on patient and insurance preference. Use up to four times daily as directed. (FOR ICD-10 E10.9, E11.9). 12/06/19   Dessa Phi, DO  DITROPAN XL 5 MG 24 hr tablet Take 5 mg by mouth 2 (two) times daily.    [provider]  Fe Fum-FePoly-Vit C-Vit B3 (INTEGRA PO) Take 1 capsule by mouth in the morning.    [provider]  ferrous sulfate 325 (65 FE) MG tablet Take 325 mg by mouth daily with breakfast.    [provider]  hydrALAZINE (APRESOLINE) 50 MG tablet Take 50 mg by mouth 3 (three) times daily.    [provider]  insulin aspart (NOVOLOG) 100 UNIT/ML injection Before each meal 3 times a day, 140-199 - 2 units, 200-250 - 4 units, 251-299 - 6 units,  300-349 - 8 units,  350 or above 10 units. Insulin PEN if approved, provide syringes and needles if needed. 04/27/21   Thurnell Lose, MD  metoprolol tartrate (LOPRESSOR) 25 MG tablet Take 0.5 tablets (12.5 mg total) by mouth 2 (two) times daily. 04/08/21 05/08/21  Elodia Florence., MD  omeprazole (PRILOSEC) 40 MG capsule Take 40 mg by mouth daily.    [provider]  senna-docusate (SENOKOT S) 8.6-50 MG tablet Take 1 tablet by mouth 2 (two) times daily. 04/02/21   Eugenie Filler, MD  Sodium Fluoride (PREVIDENT 5000 BOOSTER PLUS DT) Place 1 application onto teeth at bedtime.    [provider]  Stannous Fluoride (SENSODYNE COMPLETE PROTECTION MT) Brush using Sensodyne every morning    [provider]  verapamil (CALAN-SR) 120 MG CR tablet Take 120 mg by mouth at bedtime.    [provider]      Allergies    Ciprofloxacin and Penicillins    Review of Systems   Review of Systems  Unable to perform ROS: Patient nonverbal   Physical Exam Updated Vital Signs BP (!) 145/115    Pulse 92    Temp (!) 97.4 F (36.3  C) (Oral)    Resp (!) 25    SpO2 97%  Physical Exam Constitutional:      General: She is not in acute distress.    Appearance: Normal appearance.  HENT:     Head: Normocephalic.     Nose: Nose normal.  Eyes:     Extraocular Movements: Extraocular movements intact.  Cardiovascular:     Rate and Rhythm: Normal rate.  Pulmonary:     Effort: Pulmonary effort is normal.  Abdominal:     Tenderness: There is no abdominal tenderness. There is no guarding or rebound.  Musculoskeletal:        General: Normal range of motion.     Cervical back: Normal range of motion.  Neurological:     Mental Status: She is alert.     Comments: Patient is awake and alert.  Reportedly has confusion at  baseline.  No family available to offer additional information.    ED Results / Procedures / Treatments   Labs (all labs ordered are listed, but only abnormal results are displayed) Labs Reviewed  BRAIN NATRIURETIC PEPTIDE - Abnormal; Notable for the following components:      Result Value   B Natriuretic Peptide 196.9 (*)    All other components within normal limits  URINALYSIS, ROUTINE W REFLEX MICROSCOPIC - Abnormal; Notable for the following components:   Glucose, UA 50 (*)    Protein, ur 100 (*)    All other components within normal limits  CBC WITH DIFFERENTIAL/PLATELET - Abnormal; Notable for the following components:   WBC 12.3 (*)    RBC 3.86 (*)    Hemoglobin 10.2 (*)    HCT 33.4 (*)    RDW 15.9 (*)    Platelets 422 (*)    Neutro Abs 10.5 (*)    Abs Immature Granulocytes 0.13 (*)    All other components within normal limits  COMPREHENSIVE METABOLIC PANEL - Abnormal; Notable for the following components:   CO2 21 (*)    Glucose, Bld 189 (*)    BUN 34 (*)    Creatinine, Ser 1.12 (*)    Albumin 3.1 (*)    Total Bilirubin <0.1 (*)    GFR, Estimated 47 (*)    All other components within normal limits  CBC WITH DIFFERENTIAL/PLATELET    EKG EKG Interpretation  Date/Time:  Sunday May 09 2021 15:31:51 EST Ventricular Rate:  85 PR Interval:  140 QRS Duration: 93 QT Interval:  352 QTC Calculation: 419 R Axis:   42 Text Interpretation: Sinus rhythm Consider left ventricular hypertrophy Confirmed by Thamas Jaegers ([id number]) on 05/09/2021 6:14:23 PM  Radiology No results found.  Procedures Procedures    Medications Ordered in ED Medications - No data to display  ED Course/ Medical Decision Making/ A&P                           Medical Decision Making Amount and/or Complexity of Data Reviewed Labs: ordered.   Review of records show recent admissions in April 19, 2021 for syncope.  Work-up today is unremarkable vital signs remained stable blood pressure  is normal.  Labs are unremarkable.  Urinalysis is negative.  Patient appears comfortable.  Etiology of her symptoms unclear.  Recommending outpatient follow-up with her doctor within the week.  Commend immediate return for worsening symptoms or any additional concerns.        Final Clinical Impression(s) / ED Diagnoses Final diagnoses:  Weakness  Rx / DC Orders ED Discharge Orders     None         [place], Greggory Brandy, MD 05/09/21 [phone]

## 2021-05-17 DIAGNOSIS — E43 Unspecified severe protein-calorie malnutrition: Secondary | ICD-10-CM | POA: Diagnosis not present

## 2021-05-17 DIAGNOSIS — I69811 Memory deficit following other cerebrovascular disease: Secondary | ICD-10-CM | POA: Diagnosis not present

## 2021-05-17 DIAGNOSIS — R630 Anorexia: Secondary | ICD-10-CM | POA: Diagnosis not present

## 2021-05-17 DIAGNOSIS — R627 Adult failure to thrive: Secondary | ICD-10-CM | POA: Diagnosis not present

## 2021-05-20 DIAGNOSIS — R031 Nonspecific low blood-pressure reading: Secondary | ICD-10-CM | POA: Diagnosis not present

## 2021-05-20 DIAGNOSIS — I10 Essential (primary) hypertension: Secondary | ICD-10-CM | POA: Diagnosis not present

## 2021-05-20 DIAGNOSIS — R630 Anorexia: Secondary | ICD-10-CM | POA: Diagnosis not present

## 2021-05-20 DIAGNOSIS — E860 Dehydration: Secondary | ICD-10-CM | POA: Diagnosis not present

## 2021-05-24 DIAGNOSIS — R627 Adult failure to thrive: Secondary | ICD-10-CM | POA: Diagnosis not present

## 2021-05-24 DIAGNOSIS — R630 Anorexia: Secondary | ICD-10-CM | POA: Diagnosis not present

## 2021-05-24 DIAGNOSIS — D649 Anemia, unspecified: Secondary | ICD-10-CM | POA: Diagnosis not present

## 2021-05-24 DIAGNOSIS — I69322 Dysarthria following cerebral infarction: Secondary | ICD-10-CM | POA: Diagnosis not present

## 2021-05-24 DIAGNOSIS — I82403 Acute embolism and thrombosis of unspecified deep veins of lower extremity, bilateral: Secondary | ICD-10-CM | POA: Diagnosis not present

## 2021-05-24 DIAGNOSIS — Z7901 Long term (current) use of anticoagulants: Secondary | ICD-10-CM | POA: Diagnosis not present

## 2021-05-31 DIAGNOSIS — I82403 Acute embolism and thrombosis of unspecified deep veins of lower extremity, bilateral: Secondary | ICD-10-CM | POA: Diagnosis not present

## 2021-05-31 DIAGNOSIS — I63311 Cerebral infarction due to thrombosis of right middle cerebral artery: Secondary | ICD-10-CM | POA: Diagnosis not present

## 2021-06-08 DIAGNOSIS — I69811 Memory deficit following other cerebrovascular disease: Secondary | ICD-10-CM | POA: Diagnosis not present

## 2021-06-08 DIAGNOSIS — R262 Difficulty in walking, not elsewhere classified: Secondary | ICD-10-CM | POA: Diagnosis not present

## 2021-06-08 DIAGNOSIS — E872 Acidosis, unspecified: Secondary | ICD-10-CM | POA: Diagnosis not present

## 2021-06-08 DIAGNOSIS — M7989 Other specified soft tissue disorders: Secondary | ICD-10-CM | POA: Diagnosis not present

## 2021-06-08 DIAGNOSIS — I639 Cerebral infarction, unspecified: Secondary | ICD-10-CM | POA: Diagnosis not present

## 2021-06-08 DIAGNOSIS — M21371 Foot drop, right foot: Secondary | ICD-10-CM | POA: Diagnosis not present

## 2021-06-08 DIAGNOSIS — Z79899 Other long term (current) drug therapy: Secondary | ICD-10-CM | POA: Diagnosis not present

## 2021-06-08 DIAGNOSIS — N1831 Chronic kidney disease, stage 3a: Secondary | ICD-10-CM | POA: Diagnosis not present

## 2021-06-08 DIAGNOSIS — Z86718 Personal history of other venous thrombosis and embolism: Secondary | ICD-10-CM | POA: Diagnosis not present

## 2021-06-08 DIAGNOSIS — E1159 Type 2 diabetes mellitus with other circulatory complications: Secondary | ICD-10-CM | POA: Diagnosis not present

## 2021-06-08 DIAGNOSIS — E871 Hypo-osmolality and hyponatremia: Secondary | ICD-10-CM | POA: Diagnosis not present

## 2021-06-08 DIAGNOSIS — F0390 Unspecified dementia without behavioral disturbance: Secondary | ICD-10-CM | POA: Diagnosis not present

## 2021-06-08 DIAGNOSIS — I63411 Cerebral infarction due to embolism of right middle cerebral artery: Secondary | ICD-10-CM | POA: Diagnosis not present

## 2021-06-08 DIAGNOSIS — M25512 Pain in left shoulder: Secondary | ICD-10-CM | POA: Diagnosis not present

## 2021-06-08 DIAGNOSIS — R296 Repeated falls: Secondary | ICD-10-CM | POA: Diagnosis not present

## 2021-06-08 DIAGNOSIS — I10 Essential (primary) hypertension: Secondary | ICD-10-CM | POA: Diagnosis not present

## 2021-06-08 DIAGNOSIS — F840 Autistic disorder: Secondary | ICD-10-CM | POA: Diagnosis not present

## 2021-06-08 DIAGNOSIS — C8190 Hodgkin lymphoma, unspecified, unspecified site: Secondary | ICD-10-CM | POA: Diagnosis not present

## 2021-06-08 DIAGNOSIS — R531 Weakness: Secondary | ICD-10-CM | POA: Diagnosis not present

## 2021-06-08 DIAGNOSIS — G936 Cerebral edema: Secondary | ICD-10-CM | POA: Diagnosis not present

## 2021-06-08 DIAGNOSIS — M25432 Effusion, left wrist: Secondary | ICD-10-CM | POA: Diagnosis not present

## 2021-06-08 DIAGNOSIS — R770 Abnormality of albumin: Secondary | ICD-10-CM | POA: Diagnosis not present

## 2021-06-08 DIAGNOSIS — N179 Acute kidney failure, unspecified: Secondary | ICD-10-CM | POA: Diagnosis not present

## 2021-06-08 DIAGNOSIS — S92301D Fracture of unspecified metatarsal bone(s), right foot, subsequent encounter for fracture with routine healing: Secondary | ICD-10-CM | POA: Diagnosis not present

## 2021-06-08 DIAGNOSIS — I824Z2 Acute embolism and thrombosis of unspecified deep veins of left distal lower extremity: Secondary | ICD-10-CM | POA: Diagnosis not present

## 2021-06-08 DIAGNOSIS — M2041 Other hammer toe(s) (acquired), right foot: Secondary | ICD-10-CM | POA: Diagnosis not present

## 2021-06-08 DIAGNOSIS — F32A Depression, unspecified: Secondary | ICD-10-CM | POA: Diagnosis not present

## 2021-06-08 DIAGNOSIS — B351 Tinea unguium: Secondary | ICD-10-CM | POA: Diagnosis not present

## 2021-06-08 DIAGNOSIS — E878 Other disorders of electrolyte and fluid balance, not elsewhere classified: Secondary | ICD-10-CM | POA: Diagnosis not present

## 2021-06-08 DIAGNOSIS — I69320 Aphasia following cerebral infarction: Secondary | ICD-10-CM | POA: Diagnosis not present

## 2021-06-08 DIAGNOSIS — E1140 Type 2 diabetes mellitus with diabetic neuropathy, unspecified: Secondary | ICD-10-CM | POA: Diagnosis not present

## 2021-06-08 DIAGNOSIS — I69322 Dysarthria following cerebral infarction: Secondary | ICD-10-CM | POA: Diagnosis not present

## 2021-06-08 DIAGNOSIS — D509 Iron deficiency anemia, unspecified: Secondary | ICD-10-CM | POA: Diagnosis not present

## 2021-06-08 DIAGNOSIS — M21372 Foot drop, left foot: Secondary | ICD-10-CM | POA: Diagnosis not present

## 2021-06-08 DIAGNOSIS — I69828 Other speech and language deficits following other cerebrovascular disease: Secondary | ICD-10-CM | POA: Diagnosis not present

## 2021-06-08 DIAGNOSIS — K5900 Constipation, unspecified: Secondary | ICD-10-CM | POA: Diagnosis not present

## 2021-06-08 DIAGNOSIS — M6281 Muscle weakness (generalized): Secondary | ICD-10-CM | POA: Diagnosis not present

## 2021-06-08 DIAGNOSIS — R29898 Other symptoms and signs involving the musculoskeletal system: Secondary | ICD-10-CM | POA: Diagnosis not present

## 2021-06-08 DIAGNOSIS — E1151 Type 2 diabetes mellitus with diabetic peripheral angiopathy without gangrene: Secondary | ICD-10-CM | POA: Diagnosis not present

## 2021-06-08 DIAGNOSIS — M79674 Pain in right toe(s): Secondary | ICD-10-CM | POA: Diagnosis not present

## 2021-06-08 DIAGNOSIS — Z8673 Personal history of transient ischemic attack (TIA), and cerebral infarction without residual deficits: Secondary | ICD-10-CM | POA: Diagnosis not present

## 2021-06-08 DIAGNOSIS — R6510 Systemic inflammatory response syndrome (SIRS) of non-infectious origin without acute organ dysfunction: Secondary | ICD-10-CM | POA: Diagnosis not present

## 2021-06-08 DIAGNOSIS — D649 Anemia, unspecified: Secondary | ICD-10-CM | POA: Diagnosis not present

## 2021-06-08 DIAGNOSIS — Z7401 Bed confinement status: Secondary | ICD-10-CM | POA: Diagnosis not present

## 2021-06-08 DIAGNOSIS — M25532 Pain in left wrist: Secondary | ICD-10-CM | POA: Diagnosis not present

## 2021-06-08 DIAGNOSIS — R350 Frequency of micturition: Secondary | ICD-10-CM | POA: Diagnosis not present

## 2021-06-08 DIAGNOSIS — M79675 Pain in left toe(s): Secondary | ICD-10-CM | POA: Diagnosis not present

## 2021-06-08 DIAGNOSIS — M25522 Pain in left elbow: Secondary | ICD-10-CM | POA: Diagnosis not present

## 2021-06-08 DIAGNOSIS — K219 Gastro-esophageal reflux disease without esophagitis: Secondary | ICD-10-CM | POA: Diagnosis not present

## 2021-06-08 DIAGNOSIS — Z1159 Encounter for screening for other viral diseases: Secondary | ICD-10-CM | POA: Diagnosis not present

## 2021-06-08 DIAGNOSIS — I82442 Acute embolism and thrombosis of left tibial vein: Secondary | ICD-10-CM | POA: Diagnosis not present

## 2021-06-08 DIAGNOSIS — E43 Unspecified severe protein-calorie malnutrition: Secondary | ICD-10-CM | POA: Diagnosis not present

## 2021-06-08 DIAGNOSIS — I82403 Acute embolism and thrombosis of unspecified deep veins of lower extremity, bilateral: Secondary | ICD-10-CM | POA: Diagnosis not present

## 2021-06-08 DIAGNOSIS — I69952 Hemiplegia and hemiparesis following unspecified cerebrovascular disease affecting left dominant side: Secondary | ICD-10-CM | POA: Diagnosis not present

## 2021-06-08 DIAGNOSIS — L22 Diaper dermatitis: Secondary | ICD-10-CM | POA: Diagnosis not present

## 2021-06-08 DIAGNOSIS — I5032 Chronic diastolic (congestive) heart failure: Secondary | ICD-10-CM | POA: Diagnosis not present

## 2021-06-08 DIAGNOSIS — R5381 Other malaise: Secondary | ICD-10-CM | POA: Diagnosis not present

## 2021-06-08 DIAGNOSIS — Z23 Encounter for immunization: Secondary | ICD-10-CM | POA: Diagnosis not present

## 2021-06-08 DIAGNOSIS — E119 Type 2 diabetes mellitus without complications: Secondary | ICD-10-CM | POA: Diagnosis not present

## 2021-06-08 DIAGNOSIS — R627 Adult failure to thrive: Secondary | ICD-10-CM | POA: Diagnosis not present

## 2021-06-08 DIAGNOSIS — Z7901 Long term (current) use of anticoagulants: Secondary | ICD-10-CM | POA: Diagnosis not present

## 2021-06-08 DIAGNOSIS — E1122 Type 2 diabetes mellitus with diabetic chronic kidney disease: Secondary | ICD-10-CM | POA: Diagnosis not present

## 2021-06-08 DIAGNOSIS — F419 Anxiety disorder, unspecified: Secondary | ICD-10-CM | POA: Diagnosis not present

## 2021-06-08 DIAGNOSIS — E785 Hyperlipidemia, unspecified: Secondary | ICD-10-CM | POA: Diagnosis not present

## 2021-06-08 DIAGNOSIS — I69354 Hemiplegia and hemiparesis following cerebral infarction affecting left non-dominant side: Secondary | ICD-10-CM | POA: Diagnosis not present

## 2021-06-08 DIAGNOSIS — Z7984 Long term (current) use of oral hypoglycemic drugs: Secondary | ICD-10-CM | POA: Diagnosis not present

## 2021-06-08 DIAGNOSIS — I63311 Cerebral infarction due to thrombosis of right middle cerebral artery: Secondary | ICD-10-CM | POA: Diagnosis not present

## 2021-06-08 DIAGNOSIS — N189 Chronic kidney disease, unspecified: Secondary | ICD-10-CM | POA: Diagnosis not present

## 2021-06-08 DIAGNOSIS — R410 Disorientation, unspecified: Secondary | ICD-10-CM | POA: Diagnosis not present

## 2021-06-09 ENCOUNTER — Encounter: Payer: Self-pay | Admitting: Adult Health

## 2021-06-09 ENCOUNTER — Inpatient Hospital Stay: Payer: Medicare Other | Admitting: Adult Health

## 2021-06-09 DIAGNOSIS — N1831 Chronic kidney disease, stage 3a: Secondary | ICD-10-CM | POA: Diagnosis not present

## 2021-06-09 DIAGNOSIS — I10 Essential (primary) hypertension: Secondary | ICD-10-CM | POA: Diagnosis not present

## 2021-06-09 DIAGNOSIS — E785 Hyperlipidemia, unspecified: Secondary | ICD-10-CM | POA: Diagnosis not present

## 2021-06-09 DIAGNOSIS — E1122 Type 2 diabetes mellitus with diabetic chronic kidney disease: Secondary | ICD-10-CM | POA: Diagnosis not present

## 2021-06-09 DIAGNOSIS — I63311 Cerebral infarction due to thrombosis of right middle cerebral artery: Secondary | ICD-10-CM | POA: Diagnosis not present

## 2021-06-09 DIAGNOSIS — N179 Acute kidney failure, unspecified: Secondary | ICD-10-CM | POA: Diagnosis not present

## 2021-06-09 DIAGNOSIS — I69828 Other speech and language deficits following other cerebrovascular disease: Secondary | ICD-10-CM | POA: Diagnosis not present

## 2021-06-09 DIAGNOSIS — S92301D Fracture of unspecified metatarsal bone(s), right foot, subsequent encounter for fracture with routine healing: Secondary | ICD-10-CM | POA: Diagnosis not present

## 2021-06-09 DIAGNOSIS — I5032 Chronic diastolic (congestive) heart failure: Secondary | ICD-10-CM | POA: Diagnosis not present

## 2021-06-09 DIAGNOSIS — I82403 Acute embolism and thrombosis of unspecified deep veins of lower extremity, bilateral: Secondary | ICD-10-CM | POA: Diagnosis not present

## 2021-06-09 DIAGNOSIS — K219 Gastro-esophageal reflux disease without esophagitis: Secondary | ICD-10-CM | POA: Diagnosis not present

## 2021-06-09 DIAGNOSIS — D509 Iron deficiency anemia, unspecified: Secondary | ICD-10-CM | POA: Diagnosis not present

## 2021-06-09 NOTE — Progress Notes (Deleted)
?[facility] ?[address] ?[place] [address] ?[phone]) [phone] ? ?     HOSPITAL FOLLOW UP NOTE ? ?[name] ?Date of Birth:  Nov 29, 1930 ?Medical Record Number:  [id number]  ? ?Reason for Referral:  hospital stroke follow up ? ? ? ?SUBJECTIVE: ? ? ?CHIEF COMPLAINT:  ?No chief complaint on file. ? ? ?HPI:  ? ?[name] is a 88 y.o. female with history of HTN, dCHF, T2DM, HLD, autism and CKD who presented on 04/19/2021 with hypoxia and acute weakness. Patient was in her ALF and was walking with PT when she had acute onset of weakness and collapsed.  SPO2 at that time was 80% and rose with administration of supplemental o2.  Personally reviewed hospitalization pertinent progress notes, lab work and imaging.  Evaluated by Dr. Erlinda Hong for right MCA infarct of temporoparietal area, etiology unclear but likely secondary to cardioembolic source vs large vessel disease.  MRA showed 70% left proximal ICA stenosis, 60% right proximal ICA stenosis, stenosis of right P3 segment, right M2 and distal left M1.  Carotid Doppler right ICA 40 to 59% stenosis, left ICA 1 to 39% stenosis.  EF 60 to 65%.  EEG negative for seizures.  LE ultrasound showed DVT in right common femoral vein, proximal profunda vein, popliteal vein, posterior tibial and peroneal veins and acute DVT left posterior tibial and peroneal veins.  LDL 134.  A1c 6.8.  Questionable PE in setting of bilateral DVTs however as patient was stable, further PE work-up not indicated as management would not alter.  Placed on heparin IV and transitioned to Eliquis likely long-term given extensive DVT, possible PE and embolic strokes.  Added atorvastatin 40 mg daily.  Consulted by palliative care for numerous medical problems and not a candidate for aggressive care.  Per therapy eval's, discharged to SNF on *** for residual cognitive impairment, and bilateral upper and lower extremity weakness. ? ? ? ? ? ? ? ?PERTINENT IMAGING ? ? ?Per  hospitalization 04/19/2021 ?Code Stroke CT head New acute/subacute right temporoparietal infarct   ?MRI  moderately large acute to early subacute posterior right MCA infarct ?MRA  70% narrowing of left proximal ICA, 60% narrowing of right proximal ICA, stenosis of right P3 segment, poor opacification of proximal right M2 branch, severe stenosis in distal left M1 ?Carotid Doppler  right ICA 40-59% stenosis, left ICA 1-39% stenosis ?2D Echo EF 60 to 65%, moderate PHTN ?Lower extremity ultrasound DVT in right common femoral vein, proximal profunda vein, popliteal vein , posterior tibial and peroneal veins, acute DVT in left posterior tibial and peroneal veins ?LDL 134 ?HgbA1c 6.8 ? ? ? ? ? ? ? ? ?ROS:   ?14 system review of systems performed and negative with exception of *** ? ?PMH:  ?Past Medical History:  ?Diagnosis Date  ? Anemia   ? Anxiety and depression   ? per med rec  ? 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 Autism   ? Chronic CHF (congestive heart failure) (HCC)   ? per med rec  ? Chronic kidney disease (CKD)   ? stage 3 per chart in 09/2015  ? Diabetes mellitus without complication ([place])   ? Diabetic neuropathy ([place])   ? History of thyroid nodule   ? nontoxic goiter per med rec  ? Hodgkin disease ([place])   ? in 1980  ? HTN (hypertension), benign   ? Hyperlipidemia   ? Incontinence   ? Lack of sensation   ? Osteopenia   ?  per med rec  ? Poor balance   ? Stroke [facility])   ? Tubular adenoma of colon 01/2017  ? Vitamin D deficiency   ? ? ?PSH:  ?Past Surgical History:  ?Procedure Laterality Date  ? ABDOMINAL HYSTERECTOMY    ? BREAST LUMPECTOMY Left   ? ? ?Social History:  ?Social History  ? ?Socioeconomic History  ? Marital status: Single  ?  Spouse name: Not on file  ? Number of children: 0  ? Years of education: Not on file  ? Highest education level: Not on file  ?Occupational History  ? Not on file  ?Tobacco Use  ? Smoking status: Never  ? Smokeless tobacco: Never  ?Substance and Sexual Activity  ? Alcohol use: No  ? Drug use:  No  ? Sexual activity: Not on file  ?Other Topics Concern  ? Not on file  ?Social History Narrative  ? Lives at [facility], single, has a PhD in [place]  ? ?Social Determinants of Health  ? ?Financial Resource Strain: Not on file  ?Food Insecurity: Not on file  ?Transportation Needs: Not on file  ?Physical Activity: Not on file  ?Stress: Not on file  ?Social Connections: Not on file  ?Intimate Partner Violence: Not on file  ? ? ?Family History:  ?Family History  ?Problem Relation Age of Onset  ? CAD Mother   ?     died at 86 yo  ? Atrial fibrillation Mother   ?     and tachycardia  ? CAD Father   ?     died at 40 yo  ? Atrial fibrillation Father   ?     and tachycardia  ? ? ?Medications:   ?Current Outpatient Medications on File Prior to Visit  ?Medication Sig Dispense Refill  ? acetaminophen (TYLENOL) 325 MG tablet Take 2 tablets (650 mg total) by mouth every 6 (six) hours as needed for mild pain (or Fever >/= 101).    ? apixaban (ELIQUIS) 2.5 MG TABS tablet Take 1 tablet (2.5 mg total) by mouth 2 (two) times daily. 60 tablet   ? atorvastatin (LIPITOR) 40 MG tablet Take 1 tablet (40 mg total) by mouth daily.    ? blood glucose meter kit and supplies Dispense based on patient and insurance preference. Use up to four times daily as directed. (FOR ICD-10 E10.9, E11.9). 1 each 0  ? DITROPAN XL 5 MG 24 hr tablet Take 5 mg by mouth 2 (two) times daily.    ? Fe Fum-FePoly-Vit C-Vit B3 (INTEGRA PO) Take 1 capsule by mouth in the morning.    ? ferrous sulfate 325 (65 FE) MG tablet Take 325 mg by mouth daily with breakfast.    ? hydrALAZINE (APRESOLINE) 50 MG tablet Take 50 mg by mouth 3 (three) times daily.    ? insulin aspart (NOVOLOG) 100 UNIT/ML injection Before each meal 3 times a day, 140-199 - 2 units, 200-250 - 4 units, 251-299 - 6 units,  300-349 - 8 units,  350 or above 10 units. Insulin PEN if approved, provide syringes and needles if needed. 10 mL 0  ? metoprolol tartrate (LOPRESSOR) 25 MG tablet  Take 0.5 tablets (12.5 mg total) by mouth 2 (two) times daily. 30 tablet 0  ? omeprazole (PRILOSEC) 40 MG capsule Take 40 mg by mouth daily.    ? senna-docusate (SENOKOT S) 8.6-50 MG tablet Take 1 tablet by mouth 2 (two) times daily.    ? Sodium Fluoride (PREVIDENT 5000 BOOSTER  PLUS DT) Place 1 application onto teeth at bedtime.    ? Stannous Fluoride (SENSODYNE COMPLETE PROTECTION MT) Brush using Sensodyne every morning    ? verapamil (CALAN-SR) 120 MG CR tablet Take 120 mg by mouth at bedtime.    ? ?No current facility-administered medications on file prior to visit.  ? ? ?Allergies:   ?Allergies  ?Allergen Reactions  ? Ciprofloxacin Rash and Other (See Comments)  ?  Localized red rash to IV site; "Allergic," per [facility]  ? Penicillins Rash and Other (See Comments)  ?  Tolerates multiple cephalosporins, including cephalexin; "Allergic to Penicillin," per [facility]  ? ? ? ? ?OBJECTIVE: ? ?Physical Exam ? ?There were no vitals filed for this visit. ?There is no height or weight on file to calculate BMI. ?No results found. ? ?Depression screen [facility] 2/9 11/29/2016  ?Decreased Interest 0  ?Down, Depressed, Hopeless 0  ?PHQ - 2 Score 0  ?Altered sleeping -  ?Tired, decreased energy -  ?Change in appetite -  ?Feeling bad or failure about yourself  -  ?Trouble concentrating -  ?Moving slowly or fidgety/restless -  ?PHQ-9 Score -  ?  ? ?General: well developed, well nourished, seated, in no evident distress ?Head: head normocephalic and atraumatic.   ?Neck: supple with no carotid or supraclavicular bruits ?Cardiovascular: regular rate and rhythm, no murmurs ?Musculoskeletal: no deformity ?Skin:  no rash/petichiae ?Vascular:  Normal pulses all extremities ?  ?Neurologic Exam ?Mental Status: Awake and fully alert. Oriented to place and time. Recent and remote memory intact. Attention span, concentration and fund of knowledge appropriate. Mood and affect appropriate.  ?Cranial Nerves: Fundoscopic exam reveals sharp disc margins. Pupils  equal, briskly reactive to light. Extraocular movements full without nystagmus. Visual fields full to confrontation. Hearing intact. Facial sensation intact. Face, tongue, palate moves normally and symmetri

## 2021-06-10 DIAGNOSIS — R627 Adult failure to thrive: Secondary | ICD-10-CM | POA: Diagnosis not present

## 2021-06-10 DIAGNOSIS — R770 Abnormality of albumin: Secondary | ICD-10-CM | POA: Diagnosis not present

## 2021-06-10 DIAGNOSIS — N179 Acute kidney failure, unspecified: Secondary | ICD-10-CM | POA: Diagnosis not present

## 2021-06-10 DIAGNOSIS — K5900 Constipation, unspecified: Secondary | ICD-10-CM | POA: Diagnosis not present

## 2021-06-10 DIAGNOSIS — F419 Anxiety disorder, unspecified: Secondary | ICD-10-CM | POA: Diagnosis not present

## 2021-06-10 DIAGNOSIS — I63311 Cerebral infarction due to thrombosis of right middle cerebral artery: Secondary | ICD-10-CM | POA: Diagnosis not present

## 2021-06-10 DIAGNOSIS — I82403 Acute embolism and thrombosis of unspecified deep veins of lower extremity, bilateral: Secondary | ICD-10-CM | POA: Diagnosis not present

## 2021-06-10 DIAGNOSIS — F840 Autistic disorder: Secondary | ICD-10-CM | POA: Diagnosis not present

## 2021-06-10 DIAGNOSIS — G936 Cerebral edema: Secondary | ICD-10-CM | POA: Diagnosis not present

## 2021-06-10 DIAGNOSIS — I5032 Chronic diastolic (congestive) heart failure: Secondary | ICD-10-CM | POA: Diagnosis not present

## 2021-06-10 DIAGNOSIS — E872 Acidosis, unspecified: Secondary | ICD-10-CM | POA: Diagnosis not present

## 2021-06-10 DIAGNOSIS — N1831 Chronic kidney disease, stage 3a: Secondary | ICD-10-CM | POA: Diagnosis not present

## 2021-06-11 DIAGNOSIS — E1159 Type 2 diabetes mellitus with other circulatory complications: Secondary | ICD-10-CM | POA: Diagnosis not present

## 2021-06-11 DIAGNOSIS — E785 Hyperlipidemia, unspecified: Secondary | ICD-10-CM | POA: Diagnosis not present

## 2021-06-11 DIAGNOSIS — N189 Chronic kidney disease, unspecified: Secondary | ICD-10-CM | POA: Diagnosis not present

## 2021-06-11 DIAGNOSIS — R5381 Other malaise: Secondary | ICD-10-CM | POA: Diagnosis not present

## 2021-06-11 DIAGNOSIS — I10 Essential (primary) hypertension: Secondary | ICD-10-CM | POA: Diagnosis not present

## 2021-06-14 DIAGNOSIS — D649 Anemia, unspecified: Secondary | ICD-10-CM | POA: Diagnosis not present

## 2021-06-14 DIAGNOSIS — E871 Hypo-osmolality and hyponatremia: Secondary | ICD-10-CM | POA: Diagnosis not present

## 2021-06-16 DIAGNOSIS — M21371 Foot drop, right foot: Secondary | ICD-10-CM | POA: Diagnosis not present

## 2021-06-16 DIAGNOSIS — I10 Essential (primary) hypertension: Secondary | ICD-10-CM | POA: Diagnosis not present

## 2021-06-16 DIAGNOSIS — M21372 Foot drop, left foot: Secondary | ICD-10-CM | POA: Diagnosis not present

## 2021-06-16 DIAGNOSIS — R5381 Other malaise: Secondary | ICD-10-CM | POA: Diagnosis not present

## 2021-06-16 DIAGNOSIS — I69952 Hemiplegia and hemiparesis following unspecified cerebrovascular disease affecting left dominant side: Secondary | ICD-10-CM | POA: Diagnosis not present

## 2021-06-16 DIAGNOSIS — I63311 Cerebral infarction due to thrombosis of right middle cerebral artery: Secondary | ICD-10-CM | POA: Diagnosis not present

## 2021-06-18 DIAGNOSIS — R5381 Other malaise: Secondary | ICD-10-CM | POA: Diagnosis not present

## 2021-06-18 DIAGNOSIS — I10 Essential (primary) hypertension: Secondary | ICD-10-CM | POA: Diagnosis not present

## 2021-06-18 DIAGNOSIS — I69952 Hemiplegia and hemiparesis following unspecified cerebrovascular disease affecting left dominant side: Secondary | ICD-10-CM | POA: Diagnosis not present

## 2021-06-18 DIAGNOSIS — E119 Type 2 diabetes mellitus without complications: Secondary | ICD-10-CM | POA: Diagnosis not present

## 2021-07-15 DIAGNOSIS — N179 Acute kidney failure, unspecified: Secondary | ICD-10-CM | POA: Diagnosis not present

## 2021-07-15 DIAGNOSIS — I82403 Acute embolism and thrombosis of unspecified deep veins of lower extremity, bilateral: Secondary | ICD-10-CM | POA: Diagnosis not present

## 2021-07-15 DIAGNOSIS — R627 Adult failure to thrive: Secondary | ICD-10-CM | POA: Diagnosis not present

## 2021-07-15 DIAGNOSIS — E872 Acidosis, unspecified: Secondary | ICD-10-CM | POA: Diagnosis not present

## 2021-07-15 DIAGNOSIS — G936 Cerebral edema: Secondary | ICD-10-CM | POA: Diagnosis not present

## 2021-07-15 DIAGNOSIS — I5032 Chronic diastolic (congestive) heart failure: Secondary | ICD-10-CM | POA: Diagnosis not present

## 2021-07-15 DIAGNOSIS — K5900 Constipation, unspecified: Secondary | ICD-10-CM | POA: Diagnosis not present

## 2021-07-15 DIAGNOSIS — F840 Autistic disorder: Secondary | ICD-10-CM | POA: Diagnosis not present

## 2021-07-15 DIAGNOSIS — F419 Anxiety disorder, unspecified: Secondary | ICD-10-CM | POA: Diagnosis not present

## 2021-07-15 DIAGNOSIS — R770 Abnormality of albumin: Secondary | ICD-10-CM | POA: Diagnosis not present

## 2021-07-15 DIAGNOSIS — I63311 Cerebral infarction due to thrombosis of right middle cerebral artery: Secondary | ICD-10-CM | POA: Diagnosis not present

## 2021-07-15 DIAGNOSIS — N1831 Chronic kidney disease, stage 3a: Secondary | ICD-10-CM | POA: Diagnosis not present

## 2021-08-03 ENCOUNTER — Inpatient Hospital Stay: Payer: Medicare Other | Admitting: Adult Health

## 2021-08-03 DIAGNOSIS — F840 Autistic disorder: Secondary | ICD-10-CM | POA: Diagnosis not present

## 2021-08-03 DIAGNOSIS — R296 Repeated falls: Secondary | ICD-10-CM | POA: Diagnosis not present

## 2021-08-25 DIAGNOSIS — E785 Hyperlipidemia, unspecified: Secondary | ICD-10-CM | POA: Diagnosis not present

## 2021-08-25 DIAGNOSIS — I63311 Cerebral infarction due to thrombosis of right middle cerebral artery: Secondary | ICD-10-CM | POA: Diagnosis not present

## 2021-08-25 DIAGNOSIS — C8190 Hodgkin lymphoma, unspecified, unspecified site: Secondary | ICD-10-CM | POA: Diagnosis not present

## 2021-08-25 DIAGNOSIS — D649 Anemia, unspecified: Secondary | ICD-10-CM | POA: Diagnosis not present

## 2021-09-03 DIAGNOSIS — I82403 Acute embolism and thrombosis of unspecified deep veins of lower extremity, bilateral: Secondary | ICD-10-CM | POA: Diagnosis not present

## 2021-09-03 DIAGNOSIS — I63311 Cerebral infarction due to thrombosis of right middle cerebral artery: Secondary | ICD-10-CM | POA: Diagnosis not present

## 2021-09-03 DIAGNOSIS — G936 Cerebral edema: Secondary | ICD-10-CM | POA: Diagnosis not present

## 2021-09-03 DIAGNOSIS — I5032 Chronic diastolic (congestive) heart failure: Secondary | ICD-10-CM | POA: Diagnosis not present

## 2021-09-03 DIAGNOSIS — E872 Acidosis, unspecified: Secondary | ICD-10-CM | POA: Diagnosis not present

## 2021-09-03 DIAGNOSIS — F840 Autistic disorder: Secondary | ICD-10-CM | POA: Diagnosis not present

## 2021-09-03 DIAGNOSIS — R770 Abnormality of albumin: Secondary | ICD-10-CM | POA: Diagnosis not present

## 2021-09-03 DIAGNOSIS — N179 Acute kidney failure, unspecified: Secondary | ICD-10-CM | POA: Diagnosis not present

## 2021-09-03 DIAGNOSIS — N1831 Chronic kidney disease, stage 3a: Secondary | ICD-10-CM | POA: Diagnosis not present

## 2021-09-03 DIAGNOSIS — K5900 Constipation, unspecified: Secondary | ICD-10-CM | POA: Diagnosis not present

## 2021-09-03 DIAGNOSIS — R627 Adult failure to thrive: Secondary | ICD-10-CM | POA: Diagnosis not present

## 2021-09-03 DIAGNOSIS — F419 Anxiety disorder, unspecified: Secondary | ICD-10-CM | POA: Diagnosis not present

## 2021-09-09 DIAGNOSIS — N179 Acute kidney failure, unspecified: Secondary | ICD-10-CM | POA: Diagnosis not present

## 2021-09-09 DIAGNOSIS — I5032 Chronic diastolic (congestive) heart failure: Secondary | ICD-10-CM | POA: Diagnosis not present

## 2021-09-09 DIAGNOSIS — F840 Autistic disorder: Secondary | ICD-10-CM | POA: Diagnosis not present

## 2021-09-09 DIAGNOSIS — I63311 Cerebral infarction due to thrombosis of right middle cerebral artery: Secondary | ICD-10-CM | POA: Diagnosis not present

## 2021-09-09 DIAGNOSIS — R770 Abnormality of albumin: Secondary | ICD-10-CM | POA: Diagnosis not present

## 2021-09-09 DIAGNOSIS — R627 Adult failure to thrive: Secondary | ICD-10-CM | POA: Diagnosis not present

## 2021-09-09 DIAGNOSIS — K5900 Constipation, unspecified: Secondary | ICD-10-CM | POA: Diagnosis not present

## 2021-09-09 DIAGNOSIS — F419 Anxiety disorder, unspecified: Secondary | ICD-10-CM | POA: Diagnosis not present

## 2021-09-09 DIAGNOSIS — E872 Acidosis, unspecified: Secondary | ICD-10-CM | POA: Diagnosis not present

## 2021-09-09 DIAGNOSIS — I82403 Acute embolism and thrombosis of unspecified deep veins of lower extremity, bilateral: Secondary | ICD-10-CM | POA: Diagnosis not present

## 2021-09-09 DIAGNOSIS — G936 Cerebral edema: Secondary | ICD-10-CM | POA: Diagnosis not present

## 2021-09-09 DIAGNOSIS — N1831 Chronic kidney disease, stage 3a: Secondary | ICD-10-CM | POA: Diagnosis not present

## 2021-09-30 DIAGNOSIS — I63311 Cerebral infarction due to thrombosis of right middle cerebral artery: Secondary | ICD-10-CM | POA: Diagnosis not present

## 2021-09-30 DIAGNOSIS — R5381 Other malaise: Secondary | ICD-10-CM | POA: Diagnosis not present

## 2021-09-30 DIAGNOSIS — M21371 Foot drop, right foot: Secondary | ICD-10-CM | POA: Diagnosis not present

## 2021-09-30 DIAGNOSIS — N189 Chronic kidney disease, unspecified: Secondary | ICD-10-CM | POA: Diagnosis not present

## 2021-09-30 DIAGNOSIS — I10 Essential (primary) hypertension: Secondary | ICD-10-CM | POA: Diagnosis not present

## 2021-09-30 DIAGNOSIS — I82403 Acute embolism and thrombosis of unspecified deep veins of lower extremity, bilateral: Secondary | ICD-10-CM | POA: Diagnosis not present

## 2021-09-30 DIAGNOSIS — D649 Anemia, unspecified: Secondary | ICD-10-CM | POA: Diagnosis not present

## 2021-09-30 DIAGNOSIS — E119 Type 2 diabetes mellitus without complications: Secondary | ICD-10-CM | POA: Diagnosis not present

## 2021-09-30 DIAGNOSIS — E871 Hypo-osmolality and hyponatremia: Secondary | ICD-10-CM | POA: Diagnosis not present

## 2021-10-11 DIAGNOSIS — Z23 Encounter for immunization: Secondary | ICD-10-CM | POA: Diagnosis not present

## 2021-10-11 DIAGNOSIS — M6281 Muscle weakness (generalized): Secondary | ICD-10-CM | POA: Diagnosis not present

## 2021-10-11 DIAGNOSIS — I69354 Hemiplegia and hemiparesis following cerebral infarction affecting left non-dominant side: Secondary | ICD-10-CM | POA: Diagnosis not present

## 2021-10-11 DIAGNOSIS — Z1159 Encounter for screening for other viral diseases: Secondary | ICD-10-CM | POA: Diagnosis not present

## 2021-10-11 DIAGNOSIS — I69320 Aphasia following cerebral infarction: Secondary | ICD-10-CM | POA: Diagnosis not present

## 2021-10-12 DIAGNOSIS — M6281 Muscle weakness (generalized): Secondary | ICD-10-CM | POA: Diagnosis not present

## 2021-10-12 DIAGNOSIS — Z1159 Encounter for screening for other viral diseases: Secondary | ICD-10-CM | POA: Diagnosis not present

## 2021-10-12 DIAGNOSIS — I69320 Aphasia following cerebral infarction: Secondary | ICD-10-CM | POA: Diagnosis not present

## 2021-10-12 DIAGNOSIS — Z23 Encounter for immunization: Secondary | ICD-10-CM | POA: Diagnosis not present

## 2021-10-12 DIAGNOSIS — I69354 Hemiplegia and hemiparesis following cerebral infarction affecting left non-dominant side: Secondary | ICD-10-CM | POA: Diagnosis not present

## 2021-10-13 DIAGNOSIS — M6281 Muscle weakness (generalized): Secondary | ICD-10-CM | POA: Diagnosis not present

## 2021-10-13 DIAGNOSIS — I69320 Aphasia following cerebral infarction: Secondary | ICD-10-CM | POA: Diagnosis not present

## 2021-10-13 DIAGNOSIS — Z1159 Encounter for screening for other viral diseases: Secondary | ICD-10-CM | POA: Diagnosis not present

## 2021-10-13 DIAGNOSIS — Z23 Encounter for immunization: Secondary | ICD-10-CM | POA: Diagnosis not present

## 2021-10-13 DIAGNOSIS — I69354 Hemiplegia and hemiparesis following cerebral infarction affecting left non-dominant side: Secondary | ICD-10-CM | POA: Diagnosis not present

## 2021-10-14 DIAGNOSIS — M6281 Muscle weakness (generalized): Secondary | ICD-10-CM | POA: Diagnosis not present

## 2021-10-14 DIAGNOSIS — I69354 Hemiplegia and hemiparesis following cerebral infarction affecting left non-dominant side: Secondary | ICD-10-CM | POA: Diagnosis not present

## 2021-10-14 DIAGNOSIS — Z23 Encounter for immunization: Secondary | ICD-10-CM | POA: Diagnosis not present

## 2021-10-14 DIAGNOSIS — Z1159 Encounter for screening for other viral diseases: Secondary | ICD-10-CM | POA: Diagnosis not present

## 2021-10-14 DIAGNOSIS — I69320 Aphasia following cerebral infarction: Secondary | ICD-10-CM | POA: Diagnosis not present

## 2021-10-15 DIAGNOSIS — M6281 Muscle weakness (generalized): Secondary | ICD-10-CM | POA: Diagnosis not present

## 2021-10-15 DIAGNOSIS — I69354 Hemiplegia and hemiparesis following cerebral infarction affecting left non-dominant side: Secondary | ICD-10-CM | POA: Diagnosis not present

## 2021-10-15 DIAGNOSIS — Z23 Encounter for immunization: Secondary | ICD-10-CM | POA: Diagnosis not present

## 2021-10-15 DIAGNOSIS — Z1159 Encounter for screening for other viral diseases: Secondary | ICD-10-CM | POA: Diagnosis not present

## 2021-10-15 DIAGNOSIS — I69320 Aphasia following cerebral infarction: Secondary | ICD-10-CM | POA: Diagnosis not present

## 2021-10-18 DIAGNOSIS — Z1159 Encounter for screening for other viral diseases: Secondary | ICD-10-CM | POA: Diagnosis not present

## 2021-10-18 DIAGNOSIS — Z23 Encounter for immunization: Secondary | ICD-10-CM | POA: Diagnosis not present

## 2021-10-18 DIAGNOSIS — M6281 Muscle weakness (generalized): Secondary | ICD-10-CM | POA: Diagnosis not present

## 2021-10-18 DIAGNOSIS — I69354 Hemiplegia and hemiparesis following cerebral infarction affecting left non-dominant side: Secondary | ICD-10-CM | POA: Diagnosis not present

## 2021-10-18 DIAGNOSIS — I69320 Aphasia following cerebral infarction: Secondary | ICD-10-CM | POA: Diagnosis not present

## 2021-10-19 DIAGNOSIS — I69320 Aphasia following cerebral infarction: Secondary | ICD-10-CM | POA: Diagnosis not present

## 2021-10-19 DIAGNOSIS — Z23 Encounter for immunization: Secondary | ICD-10-CM | POA: Diagnosis not present

## 2021-10-19 DIAGNOSIS — Z1159 Encounter for screening for other viral diseases: Secondary | ICD-10-CM | POA: Diagnosis not present

## 2021-10-19 DIAGNOSIS — I69354 Hemiplegia and hemiparesis following cerebral infarction affecting left non-dominant side: Secondary | ICD-10-CM | POA: Diagnosis not present

## 2021-10-19 DIAGNOSIS — M6281 Muscle weakness (generalized): Secondary | ICD-10-CM | POA: Diagnosis not present

## 2021-10-20 DIAGNOSIS — I69354 Hemiplegia and hemiparesis following cerebral infarction affecting left non-dominant side: Secondary | ICD-10-CM | POA: Diagnosis not present

## 2021-10-20 DIAGNOSIS — Z1159 Encounter for screening for other viral diseases: Secondary | ICD-10-CM | POA: Diagnosis not present

## 2021-10-20 DIAGNOSIS — I69320 Aphasia following cerebral infarction: Secondary | ICD-10-CM | POA: Diagnosis not present

## 2021-10-20 DIAGNOSIS — M6281 Muscle weakness (generalized): Secondary | ICD-10-CM | POA: Diagnosis not present

## 2021-10-20 DIAGNOSIS — Z23 Encounter for immunization: Secondary | ICD-10-CM | POA: Diagnosis not present

## 2021-10-21 DIAGNOSIS — Z1159 Encounter for screening for other viral diseases: Secondary | ICD-10-CM | POA: Diagnosis not present

## 2021-10-21 DIAGNOSIS — I69354 Hemiplegia and hemiparesis following cerebral infarction affecting left non-dominant side: Secondary | ICD-10-CM | POA: Diagnosis not present

## 2021-10-21 DIAGNOSIS — Z23 Encounter for immunization: Secondary | ICD-10-CM | POA: Diagnosis not present

## 2021-10-21 DIAGNOSIS — M6281 Muscle weakness (generalized): Secondary | ICD-10-CM | POA: Diagnosis not present

## 2021-10-21 DIAGNOSIS — I69320 Aphasia following cerebral infarction: Secondary | ICD-10-CM | POA: Diagnosis not present

## 2021-10-22 DIAGNOSIS — I69354 Hemiplegia and hemiparesis following cerebral infarction affecting left non-dominant side: Secondary | ICD-10-CM | POA: Diagnosis not present

## 2021-10-22 DIAGNOSIS — I69320 Aphasia following cerebral infarction: Secondary | ICD-10-CM | POA: Diagnosis not present

## 2021-10-22 DIAGNOSIS — M6281 Muscle weakness (generalized): Secondary | ICD-10-CM | POA: Diagnosis not present

## 2021-10-22 DIAGNOSIS — Z23 Encounter for immunization: Secondary | ICD-10-CM | POA: Diagnosis not present

## 2021-10-22 DIAGNOSIS — Z1159 Encounter for screening for other viral diseases: Secondary | ICD-10-CM | POA: Diagnosis not present

## 2021-10-25 DIAGNOSIS — I69354 Hemiplegia and hemiparesis following cerebral infarction affecting left non-dominant side: Secondary | ICD-10-CM | POA: Diagnosis not present

## 2021-10-25 DIAGNOSIS — Z23 Encounter for immunization: Secondary | ICD-10-CM | POA: Diagnosis not present

## 2021-10-25 DIAGNOSIS — Z1159 Encounter for screening for other viral diseases: Secondary | ICD-10-CM | POA: Diagnosis not present

## 2021-10-25 DIAGNOSIS — M6281 Muscle weakness (generalized): Secondary | ICD-10-CM | POA: Diagnosis not present

## 2021-10-25 DIAGNOSIS — I69320 Aphasia following cerebral infarction: Secondary | ICD-10-CM | POA: Diagnosis not present

## 2021-10-26 DIAGNOSIS — I69354 Hemiplegia and hemiparesis following cerebral infarction affecting left non-dominant side: Secondary | ICD-10-CM | POA: Diagnosis not present

## 2021-10-26 DIAGNOSIS — M6281 Muscle weakness (generalized): Secondary | ICD-10-CM | POA: Diagnosis not present

## 2021-10-26 DIAGNOSIS — Z23 Encounter for immunization: Secondary | ICD-10-CM | POA: Diagnosis not present

## 2021-10-26 DIAGNOSIS — I69320 Aphasia following cerebral infarction: Secondary | ICD-10-CM | POA: Diagnosis not present

## 2021-10-26 DIAGNOSIS — Z1159 Encounter for screening for other viral diseases: Secondary | ICD-10-CM | POA: Diagnosis not present

## 2021-10-27 DIAGNOSIS — Z23 Encounter for immunization: Secondary | ICD-10-CM | POA: Diagnosis not present

## 2021-10-27 DIAGNOSIS — I69320 Aphasia following cerebral infarction: Secondary | ICD-10-CM | POA: Diagnosis not present

## 2021-10-27 DIAGNOSIS — M6281 Muscle weakness (generalized): Secondary | ICD-10-CM | POA: Diagnosis not present

## 2021-10-27 DIAGNOSIS — I69354 Hemiplegia and hemiparesis following cerebral infarction affecting left non-dominant side: Secondary | ICD-10-CM | POA: Diagnosis not present

## 2021-10-27 DIAGNOSIS — Z1159 Encounter for screening for other viral diseases: Secondary | ICD-10-CM | POA: Diagnosis not present

## 2021-10-28 DIAGNOSIS — Z1159 Encounter for screening for other viral diseases: Secondary | ICD-10-CM | POA: Diagnosis not present

## 2021-10-28 DIAGNOSIS — I69354 Hemiplegia and hemiparesis following cerebral infarction affecting left non-dominant side: Secondary | ICD-10-CM | POA: Diagnosis not present

## 2021-10-28 DIAGNOSIS — I69320 Aphasia following cerebral infarction: Secondary | ICD-10-CM | POA: Diagnosis not present

## 2021-10-28 DIAGNOSIS — Z23 Encounter for immunization: Secondary | ICD-10-CM | POA: Diagnosis not present

## 2021-10-28 DIAGNOSIS — M6281 Muscle weakness (generalized): Secondary | ICD-10-CM | POA: Diagnosis not present

## 2021-10-29 DIAGNOSIS — I10 Essential (primary) hypertension: Secondary | ICD-10-CM | POA: Diagnosis not present

## 2021-10-29 DIAGNOSIS — I63311 Cerebral infarction due to thrombosis of right middle cerebral artery: Secondary | ICD-10-CM | POA: Diagnosis not present

## 2021-10-29 DIAGNOSIS — F419 Anxiety disorder, unspecified: Secondary | ICD-10-CM | POA: Diagnosis not present

## 2021-10-29 DIAGNOSIS — I69354 Hemiplegia and hemiparesis following cerebral infarction affecting left non-dominant side: Secondary | ICD-10-CM | POA: Diagnosis not present

## 2021-10-29 DIAGNOSIS — M6281 Muscle weakness (generalized): Secondary | ICD-10-CM | POA: Diagnosis not present

## 2021-10-29 DIAGNOSIS — I82403 Acute embolism and thrombosis of unspecified deep veins of lower extremity, bilateral: Secondary | ICD-10-CM | POA: Diagnosis not present

## 2021-10-29 DIAGNOSIS — I69952 Hemiplegia and hemiparesis following unspecified cerebrovascular disease affecting left dominant side: Secondary | ICD-10-CM | POA: Diagnosis not present

## 2021-10-29 DIAGNOSIS — N189 Chronic kidney disease, unspecified: Secondary | ICD-10-CM | POA: Diagnosis not present

## 2021-10-29 DIAGNOSIS — Z1159 Encounter for screening for other viral diseases: Secondary | ICD-10-CM | POA: Diagnosis not present

## 2021-10-29 DIAGNOSIS — E119 Type 2 diabetes mellitus without complications: Secondary | ICD-10-CM | POA: Diagnosis not present

## 2021-10-29 DIAGNOSIS — I69320 Aphasia following cerebral infarction: Secondary | ICD-10-CM | POA: Diagnosis not present

## 2021-10-29 DIAGNOSIS — F840 Autistic disorder: Secondary | ICD-10-CM | POA: Diagnosis not present

## 2021-10-29 DIAGNOSIS — Z23 Encounter for immunization: Secondary | ICD-10-CM | POA: Diagnosis not present

## 2021-10-29 DIAGNOSIS — D649 Anemia, unspecified: Secondary | ICD-10-CM | POA: Diagnosis not present

## 2021-11-01 DIAGNOSIS — Z23 Encounter for immunization: Secondary | ICD-10-CM | POA: Diagnosis not present

## 2021-11-01 DIAGNOSIS — I69320 Aphasia following cerebral infarction: Secondary | ICD-10-CM | POA: Diagnosis not present

## 2021-11-01 DIAGNOSIS — I69354 Hemiplegia and hemiparesis following cerebral infarction affecting left non-dominant side: Secondary | ICD-10-CM | POA: Diagnosis not present

## 2021-11-01 DIAGNOSIS — M6281 Muscle weakness (generalized): Secondary | ICD-10-CM | POA: Diagnosis not present

## 2021-11-01 DIAGNOSIS — Z1159 Encounter for screening for other viral diseases: Secondary | ICD-10-CM | POA: Diagnosis not present

## 2021-11-02 DIAGNOSIS — I69354 Hemiplegia and hemiparesis following cerebral infarction affecting left non-dominant side: Secondary | ICD-10-CM | POA: Diagnosis not present

## 2021-11-02 DIAGNOSIS — Z23 Encounter for immunization: Secondary | ICD-10-CM | POA: Diagnosis not present

## 2021-11-02 DIAGNOSIS — Z1159 Encounter for screening for other viral diseases: Secondary | ICD-10-CM | POA: Diagnosis not present

## 2021-11-02 DIAGNOSIS — M6281 Muscle weakness (generalized): Secondary | ICD-10-CM | POA: Diagnosis not present

## 2021-11-02 DIAGNOSIS — I69320 Aphasia following cerebral infarction: Secondary | ICD-10-CM | POA: Diagnosis not present

## 2021-11-03 DIAGNOSIS — E872 Acidosis, unspecified: Secondary | ICD-10-CM | POA: Diagnosis not present

## 2021-11-03 DIAGNOSIS — I63311 Cerebral infarction due to thrombosis of right middle cerebral artery: Secondary | ICD-10-CM | POA: Diagnosis not present

## 2021-11-03 DIAGNOSIS — I69354 Hemiplegia and hemiparesis following cerebral infarction affecting left non-dominant side: Secondary | ICD-10-CM | POA: Diagnosis not present

## 2021-11-03 DIAGNOSIS — Z1159 Encounter for screening for other viral diseases: Secondary | ICD-10-CM | POA: Diagnosis not present

## 2021-11-03 DIAGNOSIS — I82403 Acute embolism and thrombosis of unspecified deep veins of lower extremity, bilateral: Secondary | ICD-10-CM | POA: Diagnosis not present

## 2021-11-03 DIAGNOSIS — I69320 Aphasia following cerebral infarction: Secondary | ICD-10-CM | POA: Diagnosis not present

## 2021-11-03 DIAGNOSIS — I5032 Chronic diastolic (congestive) heart failure: Secondary | ICD-10-CM | POA: Diagnosis not present

## 2021-11-03 DIAGNOSIS — N1831 Chronic kidney disease, stage 3a: Secondary | ICD-10-CM | POA: Diagnosis not present

## 2021-11-03 DIAGNOSIS — M6281 Muscle weakness (generalized): Secondary | ICD-10-CM | POA: Diagnosis not present

## 2021-11-03 DIAGNOSIS — I639 Cerebral infarction, unspecified: Secondary | ICD-10-CM | POA: Diagnosis not present

## 2021-11-03 DIAGNOSIS — N179 Acute kidney failure, unspecified: Secondary | ICD-10-CM | POA: Diagnosis not present

## 2021-11-03 DIAGNOSIS — R770 Abnormality of albumin: Secondary | ICD-10-CM | POA: Diagnosis not present

## 2021-11-03 DIAGNOSIS — I63411 Cerebral infarction due to embolism of right middle cerebral artery: Secondary | ICD-10-CM | POA: Diagnosis not present

## 2021-11-03 DIAGNOSIS — K5900 Constipation, unspecified: Secondary | ICD-10-CM | POA: Diagnosis not present

## 2021-11-03 DIAGNOSIS — Z23 Encounter for immunization: Secondary | ICD-10-CM | POA: Diagnosis not present

## 2021-11-03 DIAGNOSIS — F840 Autistic disorder: Secondary | ICD-10-CM | POA: Diagnosis not present

## 2021-11-03 DIAGNOSIS — F419 Anxiety disorder, unspecified: Secondary | ICD-10-CM | POA: Diagnosis not present

## 2021-11-03 DIAGNOSIS — G936 Cerebral edema: Secondary | ICD-10-CM | POA: Diagnosis not present

## 2021-11-03 DIAGNOSIS — R627 Adult failure to thrive: Secondary | ICD-10-CM | POA: Diagnosis not present

## 2021-11-03 DIAGNOSIS — I82442 Acute embolism and thrombosis of left tibial vein: Secondary | ICD-10-CM | POA: Diagnosis not present

## 2021-11-04 DIAGNOSIS — Z23 Encounter for immunization: Secondary | ICD-10-CM | POA: Diagnosis not present

## 2021-11-04 DIAGNOSIS — Z1159 Encounter for screening for other viral diseases: Secondary | ICD-10-CM | POA: Diagnosis not present

## 2021-11-04 DIAGNOSIS — M6281 Muscle weakness (generalized): Secondary | ICD-10-CM | POA: Diagnosis not present

## 2021-11-04 DIAGNOSIS — I69320 Aphasia following cerebral infarction: Secondary | ICD-10-CM | POA: Diagnosis not present

## 2021-11-04 DIAGNOSIS — I69354 Hemiplegia and hemiparesis following cerebral infarction affecting left non-dominant side: Secondary | ICD-10-CM | POA: Diagnosis not present

## 2021-11-05 DIAGNOSIS — Z23 Encounter for immunization: Secondary | ICD-10-CM | POA: Diagnosis not present

## 2021-11-05 DIAGNOSIS — I69354 Hemiplegia and hemiparesis following cerebral infarction affecting left non-dominant side: Secondary | ICD-10-CM | POA: Diagnosis not present

## 2021-11-05 DIAGNOSIS — Z1159 Encounter for screening for other viral diseases: Secondary | ICD-10-CM | POA: Diagnosis not present

## 2021-11-05 DIAGNOSIS — I69320 Aphasia following cerebral infarction: Secondary | ICD-10-CM | POA: Diagnosis not present

## 2021-11-05 DIAGNOSIS — M6281 Muscle weakness (generalized): Secondary | ICD-10-CM | POA: Diagnosis not present

## 2021-12-02 DIAGNOSIS — F840 Autistic disorder: Secondary | ICD-10-CM | POA: Diagnosis not present

## 2021-12-02 DIAGNOSIS — I82403 Acute embolism and thrombosis of unspecified deep veins of lower extremity, bilateral: Secondary | ICD-10-CM | POA: Diagnosis not present

## 2021-12-02 DIAGNOSIS — E872 Acidosis, unspecified: Secondary | ICD-10-CM | POA: Diagnosis not present

## 2021-12-02 DIAGNOSIS — N1831 Chronic kidney disease, stage 3a: Secondary | ICD-10-CM | POA: Diagnosis not present

## 2021-12-02 DIAGNOSIS — K5900 Constipation, unspecified: Secondary | ICD-10-CM | POA: Diagnosis not present

## 2021-12-02 DIAGNOSIS — R627 Adult failure to thrive: Secondary | ICD-10-CM | POA: Diagnosis not present

## 2021-12-02 DIAGNOSIS — F419 Anxiety disorder, unspecified: Secondary | ICD-10-CM | POA: Diagnosis not present

## 2021-12-02 DIAGNOSIS — R770 Abnormality of albumin: Secondary | ICD-10-CM | POA: Diagnosis not present

## 2021-12-02 DIAGNOSIS — I63311 Cerebral infarction due to thrombosis of right middle cerebral artery: Secondary | ICD-10-CM | POA: Diagnosis not present

## 2021-12-02 DIAGNOSIS — I5032 Chronic diastolic (congestive) heart failure: Secondary | ICD-10-CM | POA: Diagnosis not present

## 2021-12-02 DIAGNOSIS — N179 Acute kidney failure, unspecified: Secondary | ICD-10-CM | POA: Diagnosis not present

## 2021-12-02 DIAGNOSIS — G936 Cerebral edema: Secondary | ICD-10-CM | POA: Diagnosis not present

## 2021-12-24 DIAGNOSIS — Z7901 Long term (current) use of anticoagulants: Secondary | ICD-10-CM | POA: Diagnosis not present

## 2021-12-24 DIAGNOSIS — I824Z2 Acute embolism and thrombosis of unspecified deep veins of left distal lower extremity: Secondary | ICD-10-CM | POA: Diagnosis not present

## 2021-12-24 DIAGNOSIS — Z8673 Personal history of transient ischemic attack (TIA), and cerebral infarction without residual deficits: Secondary | ICD-10-CM | POA: Diagnosis not present

## 2021-12-24 DIAGNOSIS — I10 Essential (primary) hypertension: Secondary | ICD-10-CM | POA: Diagnosis not present

## 2021-12-24 DIAGNOSIS — C8190 Hodgkin lymphoma, unspecified, unspecified site: Secondary | ICD-10-CM | POA: Diagnosis not present

## 2021-12-24 DIAGNOSIS — D649 Anemia, unspecified: Secondary | ICD-10-CM | POA: Diagnosis not present

## 2021-12-24 DIAGNOSIS — E785 Hyperlipidemia, unspecified: Secondary | ICD-10-CM | POA: Diagnosis not present

## 2021-12-24 DIAGNOSIS — Z86718 Personal history of other venous thrombosis and embolism: Secondary | ICD-10-CM | POA: Diagnosis not present

## 2021-12-27 DIAGNOSIS — I63311 Cerebral infarction due to thrombosis of right middle cerebral artery: Secondary | ICD-10-CM | POA: Diagnosis not present

## 2021-12-27 DIAGNOSIS — I69811 Memory deficit following other cerebrovascular disease: Secondary | ICD-10-CM | POA: Diagnosis not present

## 2021-12-27 DIAGNOSIS — F419 Anxiety disorder, unspecified: Secondary | ICD-10-CM | POA: Diagnosis not present

## 2021-12-27 DIAGNOSIS — N1831 Chronic kidney disease, stage 3a: Secondary | ICD-10-CM | POA: Diagnosis not present

## 2021-12-27 DIAGNOSIS — R627 Adult failure to thrive: Secondary | ICD-10-CM | POA: Diagnosis not present

## 2021-12-27 DIAGNOSIS — I5032 Chronic diastolic (congestive) heart failure: Secondary | ICD-10-CM | POA: Diagnosis not present

## 2021-12-27 DIAGNOSIS — F32A Depression, unspecified: Secondary | ICD-10-CM | POA: Diagnosis not present

## 2021-12-27 DIAGNOSIS — M6281 Muscle weakness (generalized): Secondary | ICD-10-CM | POA: Diagnosis not present

## 2021-12-27 DIAGNOSIS — Z79899 Other long term (current) drug therapy: Secondary | ICD-10-CM | POA: Diagnosis not present

## 2022-01-18 DIAGNOSIS — M6281 Muscle weakness (generalized): Secondary | ICD-10-CM | POA: Diagnosis not present

## 2022-01-18 DIAGNOSIS — I69320 Aphasia following cerebral infarction: Secondary | ICD-10-CM | POA: Diagnosis not present

## 2022-01-18 DIAGNOSIS — Z23 Encounter for immunization: Secondary | ICD-10-CM | POA: Diagnosis not present

## 2022-01-18 DIAGNOSIS — I69354 Hemiplegia and hemiparesis following cerebral infarction affecting left non-dominant side: Secondary | ICD-10-CM | POA: Diagnosis not present

## 2022-01-18 DIAGNOSIS — Z1159 Encounter for screening for other viral diseases: Secondary | ICD-10-CM | POA: Diagnosis not present

## 2022-01-28 DIAGNOSIS — R627 Adult failure to thrive: Secondary | ICD-10-CM | POA: Diagnosis not present

## 2022-01-28 DIAGNOSIS — F419 Anxiety disorder, unspecified: Secondary | ICD-10-CM | POA: Diagnosis not present

## 2022-01-28 DIAGNOSIS — E1122 Type 2 diabetes mellitus with diabetic chronic kidney disease: Secondary | ICD-10-CM | POA: Diagnosis not present

## 2022-01-28 DIAGNOSIS — N179 Acute kidney failure, unspecified: Secondary | ICD-10-CM | POA: Diagnosis not present

## 2022-01-28 DIAGNOSIS — F32A Depression, unspecified: Secondary | ICD-10-CM | POA: Diagnosis not present

## 2022-01-28 DIAGNOSIS — R6510 Systemic inflammatory response syndrome (SIRS) of non-infectious origin without acute organ dysfunction: Secondary | ICD-10-CM | POA: Diagnosis not present

## 2022-01-28 DIAGNOSIS — I63311 Cerebral infarction due to thrombosis of right middle cerebral artery: Secondary | ICD-10-CM | POA: Diagnosis not present

## 2022-01-28 DIAGNOSIS — I82403 Acute embolism and thrombosis of unspecified deep veins of lower extremity, bilateral: Secondary | ICD-10-CM | POA: Diagnosis not present

## 2022-01-28 DIAGNOSIS — I5032 Chronic diastolic (congestive) heart failure: Secondary | ICD-10-CM | POA: Diagnosis not present

## 2022-01-28 DIAGNOSIS — N1831 Chronic kidney disease, stage 3a: Secondary | ICD-10-CM | POA: Diagnosis not present

## 2022-01-31 DIAGNOSIS — Z7984 Long term (current) use of oral hypoglycemic drugs: Secondary | ICD-10-CM | POA: Diagnosis not present

## 2022-01-31 DIAGNOSIS — B351 Tinea unguium: Secondary | ICD-10-CM | POA: Diagnosis not present

## 2022-01-31 DIAGNOSIS — R262 Difficulty in walking, not elsewhere classified: Secondary | ICD-10-CM | POA: Diagnosis not present

## 2022-01-31 DIAGNOSIS — M79674 Pain in right toe(s): Secondary | ICD-10-CM | POA: Diagnosis not present

## 2022-01-31 DIAGNOSIS — M2041 Other hammer toe(s) (acquired), right foot: Secondary | ICD-10-CM | POA: Diagnosis not present

## 2022-01-31 DIAGNOSIS — E1151 Type 2 diabetes mellitus with diabetic peripheral angiopathy without gangrene: Secondary | ICD-10-CM | POA: Diagnosis not present

## 2022-01-31 DIAGNOSIS — M79675 Pain in left toe(s): Secondary | ICD-10-CM | POA: Diagnosis not present

## 2022-02-01 DIAGNOSIS — E1140 Type 2 diabetes mellitus with diabetic neuropathy, unspecified: Secondary | ICD-10-CM | POA: Diagnosis not present

## 2022-02-03 DIAGNOSIS — E878 Other disorders of electrolyte and fluid balance, not elsewhere classified: Secondary | ICD-10-CM | POA: Diagnosis not present

## 2022-02-09 DIAGNOSIS — R29898 Other symptoms and signs involving the musculoskeletal system: Secondary | ICD-10-CM | POA: Diagnosis not present

## 2022-02-10 DIAGNOSIS — M25512 Pain in left shoulder: Secondary | ICD-10-CM | POA: Diagnosis not present

## 2022-02-10 DIAGNOSIS — M25522 Pain in left elbow: Secondary | ICD-10-CM | POA: Diagnosis not present

## 2022-02-10 DIAGNOSIS — M25532 Pain in left wrist: Secondary | ICD-10-CM | POA: Diagnosis not present

## 2022-02-11 DIAGNOSIS — R531 Weakness: Secondary | ICD-10-CM | POA: Diagnosis not present

## 2022-02-11 DIAGNOSIS — M25432 Effusion, left wrist: Secondary | ICD-10-CM | POA: Diagnosis not present

## 2022-02-11 DIAGNOSIS — M25532 Pain in left wrist: Secondary | ICD-10-CM | POA: Diagnosis not present

## 2022-02-16 DIAGNOSIS — M7989 Other specified soft tissue disorders: Secondary | ICD-10-CM | POA: Diagnosis not present

## 2022-03-07 DIAGNOSIS — M7989 Other specified soft tissue disorders: Secondary | ICD-10-CM | POA: Diagnosis not present

## 2022-03-07 DIAGNOSIS — R531 Weakness: Secondary | ICD-10-CM | POA: Diagnosis not present

## 2022-03-07 DIAGNOSIS — I69952 Hemiplegia and hemiparesis following unspecified cerebrovascular disease affecting left dominant side: Secondary | ICD-10-CM | POA: Diagnosis not present

## 2022-03-11 DIAGNOSIS — D649 Anemia, unspecified: Secondary | ICD-10-CM | POA: Diagnosis not present

## 2022-03-11 DIAGNOSIS — E785 Hyperlipidemia, unspecified: Secondary | ICD-10-CM | POA: Diagnosis not present

## 2022-03-11 DIAGNOSIS — C8190 Hodgkin lymphoma, unspecified, unspecified site: Secondary | ICD-10-CM | POA: Diagnosis not present

## 2022-03-11 DIAGNOSIS — L22 Diaper dermatitis: Secondary | ICD-10-CM | POA: Diagnosis not present

## 2022-03-17 DIAGNOSIS — L22 Diaper dermatitis: Secondary | ICD-10-CM | POA: Diagnosis not present

## 2022-03-17 DIAGNOSIS — C8190 Hodgkin lymphoma, unspecified, unspecified site: Secondary | ICD-10-CM | POA: Diagnosis not present

## 2022-03-17 DIAGNOSIS — D649 Anemia, unspecified: Secondary | ICD-10-CM | POA: Diagnosis not present

## 2022-03-17 DIAGNOSIS — E43 Unspecified severe protein-calorie malnutrition: Secondary | ICD-10-CM | POA: Diagnosis not present

## 2022-03-17 DIAGNOSIS — R5381 Other malaise: Secondary | ICD-10-CM | POA: Diagnosis not present

## 2022-03-17 DIAGNOSIS — F0390 Unspecified dementia without behavioral disturbance: Secondary | ICD-10-CM | POA: Diagnosis not present

## 2022-03-17 DIAGNOSIS — I82403 Acute embolism and thrombosis of unspecified deep veins of lower extremity, bilateral: Secondary | ICD-10-CM | POA: Diagnosis not present

## 2022-03-17 DIAGNOSIS — I63311 Cerebral infarction due to thrombosis of right middle cerebral artery: Secondary | ICD-10-CM | POA: Diagnosis not present

## 2022-03-17 DIAGNOSIS — E785 Hyperlipidemia, unspecified: Secondary | ICD-10-CM | POA: Diagnosis not present

## 2022-03-24 DIAGNOSIS — D649 Anemia, unspecified: Secondary | ICD-10-CM | POA: Diagnosis not present

## 2022-03-24 DIAGNOSIS — C8190 Hodgkin lymphoma, unspecified, unspecified site: Secondary | ICD-10-CM | POA: Diagnosis not present

## 2022-03-24 DIAGNOSIS — E785 Hyperlipidemia, unspecified: Secondary | ICD-10-CM | POA: Diagnosis not present

## 2022-03-24 DIAGNOSIS — L22 Diaper dermatitis: Secondary | ICD-10-CM | POA: Diagnosis not present

## 2022-03-25 DIAGNOSIS — R627 Adult failure to thrive: Secondary | ICD-10-CM | POA: Diagnosis not present

## 2022-03-25 DIAGNOSIS — R5381 Other malaise: Secondary | ICD-10-CM | POA: Diagnosis not present

## 2022-03-25 DIAGNOSIS — E43 Unspecified severe protein-calorie malnutrition: Secondary | ICD-10-CM | POA: Diagnosis not present

## 2022-03-31 DIAGNOSIS — C8190 Hodgkin lymphoma, unspecified, unspecified site: Secondary | ICD-10-CM | POA: Diagnosis not present

## 2022-03-31 DIAGNOSIS — D649 Anemia, unspecified: Secondary | ICD-10-CM | POA: Diagnosis not present

## 2022-03-31 DIAGNOSIS — E785 Hyperlipidemia, unspecified: Secondary | ICD-10-CM | POA: Diagnosis not present

## 2022-03-31 DIAGNOSIS — L22 Diaper dermatitis: Secondary | ICD-10-CM | POA: Diagnosis not present

## 2022-04-07 DIAGNOSIS — L22 Diaper dermatitis: Secondary | ICD-10-CM | POA: Diagnosis not present

## 2022-04-07 DIAGNOSIS — E785 Hyperlipidemia, unspecified: Secondary | ICD-10-CM | POA: Diagnosis not present

## 2022-04-07 DIAGNOSIS — C8190 Hodgkin lymphoma, unspecified, unspecified site: Secondary | ICD-10-CM | POA: Diagnosis not present

## 2022-04-07 DIAGNOSIS — D649 Anemia, unspecified: Secondary | ICD-10-CM | POA: Diagnosis not present

## 2022-04-14 DIAGNOSIS — C8190 Hodgkin lymphoma, unspecified, unspecified site: Secondary | ICD-10-CM | POA: Diagnosis not present

## 2022-04-14 DIAGNOSIS — E785 Hyperlipidemia, unspecified: Secondary | ICD-10-CM | POA: Diagnosis not present

## 2022-04-14 DIAGNOSIS — L22 Diaper dermatitis: Secondary | ICD-10-CM | POA: Diagnosis not present

## 2022-04-14 DIAGNOSIS — D649 Anemia, unspecified: Secondary | ICD-10-CM | POA: Diagnosis not present
